# Patient Record
Sex: Male | Born: 1960 | Race: White | Hispanic: No | Marital: Single | Smoking: Current every day smoker
Health system: Southern US, Community
[De-identification: ages and names within clinical notes are randomized; demographics above are authoritative.]

## PROBLEM LIST (undated history)

## (undated) DIAGNOSIS — J45909 Unspecified asthma, uncomplicated: Secondary | ICD-10-CM

## (undated) DIAGNOSIS — G8929 Other chronic pain: Secondary | ICD-10-CM

## (undated) DIAGNOSIS — M549 Dorsalgia, unspecified: Secondary | ICD-10-CM

## (undated) DIAGNOSIS — K5792 Diverticulitis of intestine, part unspecified, without perforation or abscess without bleeding: Secondary | ICD-10-CM

## (undated) DIAGNOSIS — M51369 Other intervertebral disc degeneration, lumbar region without mention of lumbar back pain or lower extremity pain: Secondary | ICD-10-CM

## (undated) DIAGNOSIS — H409 Unspecified glaucoma: Secondary | ICD-10-CM

## (undated) DIAGNOSIS — M5136 Other intervertebral disc degeneration, lumbar region: Secondary | ICD-10-CM

## (undated) DIAGNOSIS — M1990 Unspecified osteoarthritis, unspecified site: Secondary | ICD-10-CM

## (undated) DIAGNOSIS — J302 Other seasonal allergic rhinitis: Secondary | ICD-10-CM

## (undated) DIAGNOSIS — R519 Headache, unspecified: Secondary | ICD-10-CM

## (undated) DIAGNOSIS — F419 Anxiety disorder, unspecified: Secondary | ICD-10-CM

## (undated) DIAGNOSIS — I739 Peripheral vascular disease, unspecified: Secondary | ICD-10-CM

## (undated) DIAGNOSIS — K759 Inflammatory liver disease, unspecified: Secondary | ICD-10-CM

## (undated) DIAGNOSIS — H5440 Blindness, one eye, unspecified eye: Secondary | ICD-10-CM

## (undated) DIAGNOSIS — R51 Headache: Secondary | ICD-10-CM

## (undated) DIAGNOSIS — J189 Pneumonia, unspecified organism: Secondary | ICD-10-CM

## (undated) DIAGNOSIS — K219 Gastro-esophageal reflux disease without esophagitis: Secondary | ICD-10-CM

## (undated) DIAGNOSIS — IMO0001 Reserved for inherently not codable concepts without codable children: Secondary | ICD-10-CM

## (undated) DIAGNOSIS — S72009A Fracture of unspecified part of neck of unspecified femur, initial encounter for closed fracture: Secondary | ICD-10-CM

## (undated) DIAGNOSIS — F319 Bipolar disorder, unspecified: Secondary | ICD-10-CM

## (undated) DIAGNOSIS — J449 Chronic obstructive pulmonary disease, unspecified: Secondary | ICD-10-CM

## (undated) DIAGNOSIS — R569 Unspecified convulsions: Secondary | ICD-10-CM

## (undated) HISTORY — PX: JOINT REPLACEMENT: SHX530

## (undated) HISTORY — PX: MANDIBLE SURGERY: SHX707

## (undated) HISTORY — PX: EYE SURGERY: SHX253

## (undated) HISTORY — PX: HIP SURGERY: SHX245

## (undated) HISTORY — DX: Fracture of unspecified part of neck of unspecified femur, initial encounter for closed fracture: S72.009A

---

## 2004-04-02 ENCOUNTER — Inpatient Hospital Stay: Payer: Self-pay

## 2005-07-17 ENCOUNTER — Ambulatory Visit: Payer: Self-pay

## 2006-06-01 ENCOUNTER — Emergency Department: Payer: Self-pay | Admitting: Emergency Medicine

## 2007-01-20 ENCOUNTER — Emergency Department: Payer: Self-pay | Admitting: Emergency Medicine

## 2007-12-07 ENCOUNTER — Emergency Department: Payer: Self-pay | Admitting: Emergency Medicine

## 2008-01-11 ENCOUNTER — Emergency Department: Payer: Self-pay | Admitting: Emergency Medicine

## 2010-09-28 ENCOUNTER — Emergency Department: Payer: Self-pay | Admitting: Internal Medicine

## 2010-09-29 ENCOUNTER — Inpatient Hospital Stay (HOSPITAL_COMMUNITY): Payer: Medicaid Other

## 2010-09-29 ENCOUNTER — Inpatient Hospital Stay (HOSPITAL_COMMUNITY)
Admission: EM | Admit: 2010-09-29 | Discharge: 2010-10-16 | DRG: 391 | Disposition: A | Discharge status: Home / Self Care | Payer: Medicaid Other | Source: Other Acute Inpatient Hospital | Attending: Surgery | Admitting: Surgery

## 2010-09-29 DIAGNOSIS — Z79899 Other long term (current) drug therapy: Secondary | ICD-10-CM

## 2010-09-29 DIAGNOSIS — K750 Abscess of liver: Secondary | ICD-10-CM | POA: Diagnosis present

## 2010-09-29 DIAGNOSIS — H5440 Blindness, one eye, unspecified eye: Secondary | ICD-10-CM | POA: Diagnosis present

## 2010-09-29 DIAGNOSIS — I319 Disease of pericardium, unspecified: Secondary | ICD-10-CM | POA: Diagnosis present

## 2010-09-29 DIAGNOSIS — J189 Pneumonia, unspecified organism: Secondary | ICD-10-CM | POA: Diagnosis not present

## 2010-09-29 DIAGNOSIS — N321 Vesicointestinal fistula: Secondary | ICD-10-CM | POA: Diagnosis present

## 2010-09-29 DIAGNOSIS — R1902 Left upper quadrant abdominal swelling, mass and lump: Secondary | ICD-10-CM

## 2010-09-29 DIAGNOSIS — R222 Localized swelling, mass and lump, trunk: Secondary | ICD-10-CM

## 2010-09-29 DIAGNOSIS — J90 Pleural effusion, not elsewhere classified: Secondary | ICD-10-CM | POA: Diagnosis present

## 2010-09-29 DIAGNOSIS — F319 Bipolar disorder, unspecified: Secondary | ICD-10-CM | POA: Diagnosis present

## 2010-09-29 DIAGNOSIS — B1920 Unspecified viral hepatitis C without hepatic coma: Secondary | ICD-10-CM | POA: Diagnosis present

## 2010-09-29 DIAGNOSIS — J4489 Other specified chronic obstructive pulmonary disease: Secondary | ICD-10-CM | POA: Diagnosis present

## 2010-09-29 DIAGNOSIS — F17200 Nicotine dependence, unspecified, uncomplicated: Secondary | ICD-10-CM | POA: Diagnosis present

## 2010-09-29 DIAGNOSIS — J449 Chronic obstructive pulmonary disease, unspecified: Secondary | ICD-10-CM | POA: Diagnosis present

## 2010-09-29 DIAGNOSIS — F1010 Alcohol abuse, uncomplicated: Secondary | ICD-10-CM | POA: Diagnosis present

## 2010-09-29 DIAGNOSIS — K5732 Diverticulitis of large intestine without perforation or abscess without bleeding: Principal | ICD-10-CM | POA: Diagnosis present

## 2010-09-29 LAB — MRSA PCR SCREENING: MRSA by PCR: NEGATIVE

## 2010-09-29 MED ORDER — GADOBENATE DIMEGLUMINE 529 MG/ML IV SOLN
18.0000 mL | Freq: Once | INTRAVENOUS | Status: AC
  Administered 2010-09-29: 18 mL via INTRAVENOUS

## 2010-09-30 LAB — COMPREHENSIVE METABOLIC PANEL
ALT: 21 U/L (ref 0–53)
AST: 22 U/L (ref 0–37)
Albumin: 2 g/dL — ABNORMAL LOW (ref 3.5–5.2)
CO2: 25 mEq/L (ref 19–32)
Calcium: 8 mg/dL — ABNORMAL LOW (ref 8.4–10.5)
Chloride: 99 mEq/L (ref 96–112)
Creatinine, Ser: 0.93 mg/dL (ref 0.4–1.5)
GFR calc Af Amer: >60 mL/min (ref >60)
GFR calc non Af Amer: >60 mL/min (ref >60)
Sodium: 133 mEq/L — ABNORMAL LOW (ref 135–145)

## 2010-09-30 LAB — CBC
Hemoglobin: 10.1 g/dL — ABNORMAL LOW (ref 13.0–17.0)
MCHC: 32.4 g/dL (ref 30.0–36.0)
Platelets: 464 10*3/uL — ABNORMAL HIGH (ref 150–400)
RBC: 3.52 MIL/uL — ABNORMAL LOW (ref 4.22–5.81)

## 2010-09-30 LAB — CANCER ANTIGEN 19-9: CA 19-9: 9.5 U/mL — ABNORMAL LOW (ref <35.0)

## 2010-09-30 LAB — AFP TUMOR MARKER: AFP-Tumor Marker: <1.3 ng/mL (ref 0.0–8.0)

## 2010-09-30 LAB — CEA: CEA: 1.6 ng/mL (ref 0.0–5.0)

## 2010-10-01 ENCOUNTER — Inpatient Hospital Stay (HOSPITAL_COMMUNITY): Payer: Medicaid Other

## 2010-10-01 LAB — CBC
Hemoglobin: 11.4 g/dL — ABNORMAL LOW (ref 13.0–17.0)
MCH: 29.5 pg (ref 26.0–34.0)
Platelets: 431 10*3/uL — ABNORMAL HIGH (ref 150–400)
RBC: 3.86 MIL/uL — ABNORMAL LOW (ref 4.22–5.81)
WBC: 18.6 10*3/uL — ABNORMAL HIGH (ref 4.0–10.5)

## 2010-10-01 LAB — URINALYSIS, ROUTINE W REFLEX MICROSCOPIC
Bilirubin Urine: NEGATIVE
Hgb urine dipstick: NEGATIVE
Ketones, ur: NEGATIVE mg/dL
Nitrite: NEGATIVE
Specific Gravity, Urine: 1.005 (ref 1.005–1.030)
Urobilinogen, UA: 0.2 mg/dL (ref 0.0–1.0)

## 2010-10-01 LAB — COMPREHENSIVE METABOLIC PANEL
ALT: 21 U/L (ref 0–53)
AST: 21 U/L (ref 0–37)
Albumin: 2.3 g/dL — ABNORMAL LOW (ref 3.5–5.2)
Alkaline Phosphatase: 120 U/L — ABNORMAL HIGH (ref 39–117)
CO2: 28 mEq/L (ref 19–32)
Chloride: 100 mEq/L (ref 96–112)
Creatinine, Ser: 1.15 mg/dL (ref 0.4–1.5)
GFR calc Af Amer: >60 mL/min (ref >60)
GFR calc non Af Amer: >60 mL/min (ref >60)
Potassium: 4.1 mEq/L (ref 3.5–5.1)
Sodium: 136 mEq/L (ref 135–145)
Total Bilirubin: 0.5 mg/dL (ref 0.3–1.2)

## 2010-10-01 LAB — URINE MICROSCOPIC-ADD ON

## 2010-10-02 ENCOUNTER — Inpatient Hospital Stay (HOSPITAL_COMMUNITY): Payer: Medicaid Other

## 2010-10-02 ENCOUNTER — Encounter (HOSPITAL_COMMUNITY): Payer: Self-pay | Admitting: Radiology

## 2010-10-02 LAB — CBC
Hemoglobin: 10.4 g/dL — ABNORMAL LOW (ref 13.0–17.0)
MCHC: 32.1 g/dL (ref 30.0–36.0)
RDW: 14.9 % (ref 11.5–15.5)
WBC: 14.9 10*3/uL — ABNORMAL HIGH (ref 4.0–10.5)

## 2010-10-02 LAB — URINE CULTURE: Culture  Setup Time: 201204031200

## 2010-10-02 LAB — HCV RNA QUANT RFLX ULTRA OR GENOTYP

## 2010-10-02 LAB — EXPECTORATED SPUTUM ASSESSMENT W GRAM STAIN, RFLX TO RESP C

## 2010-10-02 MED ORDER — IOHEXOL 300 MG/ML  SOLN
100.0000 mL | Freq: Once | INTRAMUSCULAR | Status: AC | PRN
  Administered 2010-10-02: 100 mL via INTRAVENOUS

## 2010-10-03 ENCOUNTER — Inpatient Hospital Stay (HOSPITAL_COMMUNITY): Payer: Medicaid Other

## 2010-10-04 ENCOUNTER — Inpatient Hospital Stay (HOSPITAL_COMMUNITY): Payer: Medicaid Other

## 2010-10-04 LAB — BASIC METABOLIC PANEL
BUN: 8 mg/dL (ref 6–23)
Calcium: 8.4 mg/dL (ref 8.4–10.5)
Creatinine, Ser: 1.1 mg/dL (ref 0.4–1.5)
GFR calc Af Amer: >60 mL/min (ref >60)
GFR calc non Af Amer: >60 mL/min (ref >60)

## 2010-10-04 LAB — CBC
MCHC: 32.8 g/dL (ref 30.0–36.0)
Platelets: 407 10*3/uL — ABNORMAL HIGH (ref 150–400)
RDW: 14.7 % (ref 11.5–15.5)
WBC: 16.7 10*3/uL — ABNORMAL HIGH (ref 4.0–10.5)

## 2010-10-05 LAB — CBC
MCV: 88 fL (ref 78.0–100.0)
Platelets: 414 10*3/uL — ABNORMAL HIGH (ref 150–400)
RDW: 14.8 % (ref 11.5–15.5)
WBC: 11.4 10*3/uL — ABNORMAL HIGH (ref 4.0–10.5)

## 2010-10-05 LAB — BASIC METABOLIC PANEL
BUN: 6 mg/dL (ref 6–23)
GFR calc Af Amer: >60 mL/min (ref >60)
GFR calc non Af Amer: >60 mL/min (ref >60)
Potassium: 3.9 mEq/L (ref 3.5–5.1)
Sodium: 136 mEq/L (ref 135–145)

## 2010-10-05 LAB — CULTURE, RESPIRATORY W GRAM STAIN: Culture: NORMAL

## 2010-10-07 LAB — CULTURE, ROUTINE-ABSCESS: Culture: NO GROWTH

## 2010-10-07 LAB — CBC
MCV: 88.5 fL (ref 78.0–100.0)
Platelets: 549 10*3/uL — ABNORMAL HIGH (ref 150–400)
RBC: 3.57 MIL/uL — ABNORMAL LOW (ref 4.22–5.81)
WBC: 9.8 10*3/uL (ref 4.0–10.5)

## 2010-10-11 ENCOUNTER — Encounter (HOSPITAL_COMMUNITY): Payer: Self-pay | Admitting: Radiology

## 2010-10-11 ENCOUNTER — Inpatient Hospital Stay (HOSPITAL_COMMUNITY): Payer: Medicaid Other

## 2010-10-11 MED ORDER — IOHEXOL 300 MG/ML  SOLN
80.0000 mL | Freq: Once | INTRAMUSCULAR | Status: AC | PRN
  Administered 2010-10-11: 80 mL via INTRAVENOUS

## 2010-10-12 ENCOUNTER — Inpatient Hospital Stay (HOSPITAL_COMMUNITY): Payer: Medicaid Other

## 2010-10-12 LAB — APTT: aPTT: 31 seconds (ref 24–37)

## 2010-10-12 LAB — CBC
HCT: 35.4 % — ABNORMAL LOW (ref 39.0–52.0)
Hemoglobin: 11.3 g/dL — ABNORMAL LOW (ref 13.0–17.0)
WBC: 8.6 10*3/uL (ref 4.0–10.5)

## 2010-10-12 LAB — COMPREHENSIVE METABOLIC PANEL
ALT: 9 U/L (ref 0–53)
Alkaline Phosphatase: 65 U/L (ref 39–117)
CO2: 26 mEq/L (ref 19–32)
GFR calc non Af Amer: >60 mL/min (ref >60)
Glucose, Bld: 93 mg/dL (ref 70–99)
Potassium: 3.9 mEq/L (ref 3.5–5.1)
Sodium: 137 mEq/L (ref 135–145)

## 2010-10-12 LAB — DIFFERENTIAL
Basophils Absolute: 0.2 10*3/uL — ABNORMAL HIGH (ref 0.0–0.1)
Lymphocytes Relative: 34 % (ref 12–46)
Monocytes Absolute: 1.1 10*3/uL — ABNORMAL HIGH (ref 0.1–1.0)
Neutro Abs: 3.7 10*3/uL (ref 1.7–7.7)

## 2010-10-12 LAB — ANAEROBIC CULTURE

## 2010-10-12 LAB — MAGNESIUM: Magnesium: 1.9 mg/dL (ref 1.5–2.5)

## 2010-10-14 NOTE — Consult Note (Signed)
NAME:  Perry Bullock, Perry Bullock               ACCOUNT NO.:  [id number]  MEDICAL RECORD NO.:  [id number]           PATIENT TYPE:  I  LOCATION:  2302                         FACILITY:  [facility]  PHYSICIAN:  Wilmon Arms. Corliss Skains, M.D. DATE OF BIRTH:  1961/04/25  DATE OF CONSULTATION:  09/29/2010 DATE OF DISCHARGE:                                CONSULTATION   REASON FOR EVALUATION:  Epigastric mass and a colovesical fistula.  HISTORY OF PRESENT ILLNESS:  This is a 50 year old male who was transferred to Dr. Laneta Sim[facility] this morning from [facility]. Reason for transfer was a large pericardial effusion.  The patient presents with approximately 3 weeks of progressive epigastric abdominal pain.  This was associated with decreased appetite secondary to the pain.  He also describes a fever, although he has been afebrile here. He denies any nausea, vomiting and has poor appetite.  The patient does have a history of diverticulitis which was first diagnosed about 5 years ago.  He was evaluated several times in [facility], but surgery was never recommended.  Over the last 18 months, he has noted some lower abdominal pain as well as some pneumaturia and occasional solid material in his urine.  He presents to [facility] for the epigastric pain. A CT scan there showed a very large epigastric mass and pericardial effusion.  The patient was subsequently transferred to [facility] for further evaluation and management.  PAST MEDICAL HISTORY: 1. Hepatitis C secondary to IV drug abuse. 2. Alcohol abuse. 3. Bipolar disorder. 4. Asthma. 5. Diverticulitis.  PAST SURGICAL HISTORY: 1. Right eye surgery. 2. Jaw surgery.  SOCIAL HISTORY:  The patient drinks heavily says he smokes just a few cigarettes a day.  ALLERGIES:  None.  MEDICATIONS:  Advair, albuterol, Depakote, Prilosec, Seroquel and trazodone.  PHYSICAL EXAMINATION:  GENERAL:  This is a well-developed, well- nourished male, in no  apparent distress.  He is awake, alert and cooperative. EYES:  EOMI.  Obvious changes post surgery of the right eye.  He is blind in the right eye. LUNGS:  Clear to auscultation bilaterally.  Normal respiratory effort. HEART:  Regular rate and rhythm.  No murmur. ABDOMEN:  Decreased bowel sounds, firm mass in the epigastrium which is moderately tender.  No sign of peritonitis.  EXTREMITIES:  No edema. SKIN:  With dryness and jaundice.  LABORATORY DATA:  We have a CBC from [place] showed a white count 20.8, hemoglobin 11.8 and platelet count 488.  CT of the abdomen and pelvis from [place] shows a pericolic fusion as well as a 10.4-cm complex cyst mass under the diaphragm that appears to communicate with the pericardium.  There is some indentation of the anterior surface of the liver with some surrounding liver edema.  He also has sigmoid diverticulosis with air within the bladder.  IMPRESSION: 1. Epigastric mass of unclear etiology.  This could become from the     liver versus the pericardium.  We will obtain a MRI of the abdomen     to determine if the mass is arising from the pericardium or the     liver. 2. 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  He will need     repair of this at some point. 3. Hepatitis C.  RECOMMENDATIONS:  We will await the results of the MRI before offering a recommendations on possible surgical resection and further management. We will continue to follow with you.     Wilmon Arms. Corliss Skains, M.D.     MKT/MEDQ  D:  09/29/2010  T:  09/30/2010  Job:  [id number]  cc:   Evelene Croon, M.D.  Electronically Signed by Manus Rudd M.D. on 10/14/2010 10:42:26 AM

## 2010-10-15 ENCOUNTER — Inpatient Hospital Stay (HOSPITAL_COMMUNITY): Payer: Medicaid Other

## 2010-10-16 LAB — COMPREHENSIVE METABOLIC PANEL
ALT: 10 U/L (ref 0–53)
Calcium: 8.8 mg/dL (ref 8.4–10.5)
Creatinine, Ser: 1.17 mg/dL (ref 0.4–1.5)
GFR calc Af Amer: >60 mL/min (ref >60)
Glucose, Bld: 108 mg/dL — ABNORMAL HIGH (ref 70–99)
Sodium: 137 mEq/L (ref 135–145)
Total Protein: 6.3 g/dL (ref 6.0–8.3)

## 2010-10-16 LAB — CBC
HCT: 37 % — ABNORMAL LOW (ref 39.0–52.0)
MCHC: 31.9 g/dL (ref 30.0–36.0)
RDW: 15.1 % (ref 11.5–15.5)

## 2010-10-16 LAB — MAGNESIUM: Magnesium: 2 mg/dL (ref 1.5–2.5)

## 2010-10-29 ENCOUNTER — Other Ambulatory Visit: Payer: Self-pay | Admitting: General Surgery

## 2010-10-29 DIAGNOSIS — K5792 Diverticulitis of intestine, part unspecified, without perforation or abscess without bleeding: Secondary | ICD-10-CM

## 2010-10-29 NOTE — H&P (Signed)
NAME:  Perry Bullock, Perry Bullock               ACCOUNT NO.:  [id number]  MEDICAL RECORD NO.:  [id number]           PATIENT TYPE:  I  LOCATION:  2302                         FACILITY:  [facility]  PHYSICIAN:  Evelene Croon, M.D.     DATE OF BIRTH:  12-16-1960  DATE OF ADMISSION:  09/29/2010 DATE OF DISCHARGE:                             HISTORY & PHYSICAL   REFERRING PHYSICIAN:  [facility], Dr. Electa Sniff.  REASON FOR ADMISSION:  Large pericardial effusion with subdiaphragmatic cystic mass.  CLINICAL HISTORY:  I was asked to transfer Mr. Penny to my service from [facility] Emergency Room after he presented there with a 3- week history of progressive epigastric and left-sided abdominal pain. He has a history of diverticulitis in the past with a known colovesical fistula causing pneumaturia .  He said that he was treated for this at [facility] about 6-8 months ago and was on antibiotics, but never required surgery.  He has a history of hepatitis C secondary to intravenous drug use many years ago as well as a history of ongoing heavy alcohol use and said that he felt this pain was most likely secondary to his liver.  He has not had much appetite for the past few weeks and said that he has even been drinking less alcohol and smoking less due to his abdominal discomfort.  He has had no nausea or vomiting. Denies any dysphagia.  He has been having normal bowel movements and denies any melena or bright red blood per rectum.  He does report some fever and chills as well as some fatigue.  A CT scan of the abdomen done at [facility] was reviewed by me. This showed a moderate to large size circumferential pericardial effusion that appear to communicate with a cystic mass or collection that extended down through the anterior portion of the diaphragm into the epigastrium with pressure on the liver causing some splaying of the right and left hepatic lobes  with surrounding edema of the liver.  There is also some air in the bladder and some bladder wall thickening with a loop of colon adjacent to it.  The patient had a small to moderate-sized left pleural effusion with some left lower lobe atelectasis.  There was trivial right pleural effusion.  REVIEW OF SYSTEMS:  As follows.  GENERAL:  He does report some fever and chills over the past few weeks.  He reports fatigue.  Appetite has been decreased.  His weight has been stable.  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  ENT: Negative.  He does not see a dentist regularly.  ENDOCRINE: He denies diabetes and hypothyroidism.  CARDIOVASCULAR:  He denies any chest pain or pressure.  He denies PND, but has had some orthopnea and exertional dyspnea.  He denies palpitations and peripheral edema. RESPIRATORY:  He denies cough and sputum production.  GI:  He had no nausea or vomiting.  He denies melena and bright red blood per rectum. GU:  He denies dysuria.  He has  had some hematuria and some sediment in air in his urine.  ALLERGIES:  None.  PSYCHIATRIC:  He does have a history of bipolar disorder and treated in a [facility]. HEMATOLOGIC:  Denies any history of bleeding disorders or easy bleeding.  PAST MEDICAL HISTORY:  Significant for a history of hepatitis C which he feels is probably secondary to intravenous drug use many years ago.  He denies current drug use.  He had history of bipolar disorder as mentioned above.  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  He had surgery on his right jaw after an accident.  He had surgery on his foot.  He has a history of diverticulitis with colovesical fistula as mentioned above.  This has been followed at [facility].  SOCIAL HISTORY:  He is unemployed and lives with his girlfriend.  He has smoked 1-1.5 pack of cigarettes per day and  over the past few weeks when he has cut down about 5 cigarettes per day.  He said that he usually drinks as much alcohol as he can get his hands on, but over the past 3 weeks has limited to a couple beers a day.  He has a long history of heavy drinking, but denies any history of DTs and said that he has quit drinking in the past for a couple years without problems.  He is currently trying to get on Disability.  FAMILY HISTORY:  Negative.  PHYSICAL EXAMINATION:  GENERAL:  He is a well-developed white male in no distress. VITAL SIGNS:  Blood pressure is 125/75.  His pulse is 90 and regular, respiratory rate is 16 unlabored. HEENT:  Normocephalic and atraumatic.  His left pupil is reactive to light and accommodation.  There is discoloration and no pupillary response in the right eye which is deviated laterally.  Oral exam shows no lesions.  He has poor dentition. NECK:  Normal carotid pulses bilaterally, but no bruits.  There is no adenopathy or thyromegaly.  There is no supraclavicular or axillary adenopathy. CARDIAC:  Regular rate and rhythm with distant heart sounds.  There is a loud rub present.  There is no murmur. LUNGS:  Clear. ABDOMEN:  Active bowel sounds.  His abdomen is protuberant.  The epigastrium is firm and tender.  There is mild tenderness across the lower abdomen, but no peritoneal signs. EXTREMITY EXAM:  No peripheral edema.  Pedal pulses are palpable bilaterally. SKIN:  Warm and dry. NEUROLOGIC:  Alert and oriented x3.  Motor and sensory exams are grossly normal.  LABORATORY EXAMINATION:  Significant for normal electrolytes.  BUN and creatinine are normal.  His albumin is low at 2.6.  Liver function profile is within normal limits.  Urinalysis shows large leukocyte esterase with no bacteria present.  White blood cell count is 20,000. Hemoglobin is 10.  IMPRESSION:  Mr. Sunde has a moderate to large circumferential pericardial effusion that appears to be in  continuity with a large subdiaphragmatic epigastric mass that is impinging on the liver causing surrounding edema.  It is not completely clear if this cystic mass originates from the liver or from the pericardium.  It could be a pericardial cyst, although I would not expect that to exert so much pressure on the liver that it causes surrounding edema from pressure. It is also possibly it could be a benign or malignant tumor.  I will consult General Surgery concerning the subdiaphragmatic process, so we can  decide on the best management.  I think he will require drainage of the pericardial effusion, probably through a subxiphoid approach, but may require partial sternotomy or abdominal exploration to definitively manage the cystic subdiaphragmatic process.  The patient is hemodynamically stable and relatively comfortable at this time and I do not think there is any evidence of tamponade at this time.  I do not think surgery needs to be done today, but I would tentatively plan to drain this pericardial fluid collection tomorrow on September 30, 2010.  I discussed all this with the patient and my plan is for general surgery consultation and he is in agreement with that.     Evelene Croon, M.D.     BB/MEDQ  D:  09/29/2010  T:  09/30/2010  Job:  [id number]  Electronically Signed by Evelene Croon M.D. on 10/29/2010 11:25:08 AM

## 2010-10-31 NOTE — Discharge Summary (Signed)
NAME:  Perry Bullock, Perry Bullock               ACCOUNT NO.:  [id number]  MEDICAL RECORD NO.:  [id number]           PATIENT TYPE:  I  LOCATION:  5126                         FACILITY:  [facility]  PHYSICIAN:  Perry Park. Daphine Deutscher, MD  DATE OF BIRTH:  Jul 02, 1960  DATE OF ADMISSION:  09/29/2010 DATE OF DISCHARGE:  10/16/2010                              DISCHARGE SUMMARY   HISTORY OF PRESENT ILLNESS:  Perry Bullock is a 50 year old gentleman who was originally seen at [facility] with evidence of a large pericardial effusion with a subdiaphragmatic cystic mass.  He was subsequently transferred to [facility] and seen by Perry Bullock of [facility].  His assessment was that this pericardial effusion appeared to be in continuity with the epigastric mass likely more essentially originating from the liver and less from the pericardium. Therefore, the decision was made that the general surgery consultation would be obtained.  The patient states he has had a history of colovesical fistula which was treated conservatively at [facility] and he states he has never required surgery by them.  However, he does state that he continues to have symptoms including pneumaturia.  He was seen by Perry Bullock in consultation for this epigastric/liver mass as well as colovesical fistula.  The patient had an MRI ordered.  SUMMARY OF HOSPITAL COURSE:  After admission on September 30, 2010, the MRI initially ordered suggested that this liver mass could possibly be malignant in origin.  However, there were still continued changes evident with possible diverticular findings in the pelvis.  Therefore, a CT scan was ordered.  This continued to show a large lesion in the left lobe of the liver tracking anterior to the liver between the anterior abdominal wall and lateral segment.  There was diverticular changes in the left colon and colonic wall thickening in the sigmoid adjacent to the bladder with evidence of gas  in the bladder suggesting evidence of a colovesical fistula.  Therefore, the decision was made that the patient may have a secondary liver abscess to a primary diverticulitis with colovesical fistula that has been chronic for some time.  The patient was taken to the Interventional Radiology Department and a CT-guided abscess drain was placed into the hepatic abscess.  The patient tolerated this well and showed significant improvement in his overall symptoms after this drain was placed.  His culture did not show any significant growth but his white count normalized fairly rapidly.  The patient had a subsequent followup CT scan 1 week later showing that the left hepatic lobe abscess had essentially resolved and therefore the drain was subsequently removed.  The following day there was concern that there may have been residual fluid collection in the liver and therefore a repeat ultrasound was ordered, however, this showed no focal abnormality or residual abscess in the left lobe of the liver. Therefore, no new drain or aspiration was performed.  Overall the patient's clinical picture was significantly improved since his admission.  He does have some underlying COPD and emphysematous changes of the lungs and there was concern at one point for pneumonia, however, the patient has  not presented and not found radiographic findings consistent with a persistent pneumonia.  He has had to be placed on Combivent inhaler as well as some nebulizers to improve his airway but has not had any respiratory distress throughout his hospitalization.  His antibiotics were eventually converted from IV to p.o. form which he has tolerated well.  At this point, his drain has been removed.  He has not shown residual fever or change in his labs and he has not had any residual abdominal pain.  He does continue to have mild intermittent pneumaturia.  However, we feel at this point the patient is stable for discharge  home on p.o. antibiotics with plans for eventual surgical intervention to be set up electively.  DISCHARGE DIAGNOSES: 1. Sigmoid diverticulitis - resolved. 2. 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 3. Secondary liver abscess status post percutaneous drain - resolved. 4. Chronic obstructive pulmonary disease/emphysema. 5. Mixed anxiety disorder.  DISCHARGE MEDICATIONS: 1. Cipro 500 mg twice daily. 2. Flagyl 500 mg twice daily. 3. Combivent inhaler 2 puffs 4 times daily. 4. Albuterol inhaler q.4 h. p.r.n. 5. Seroquel 100 mg 1 tablet daily at bedtime. 6. Trazodone 100 mg 1 tablet daily at bedtime. 7. Multivitamin once daily. 8. Oxycodone 5 mg immediate release 1-2 tablets q.4 h. p.r.n. pain.  He is given an appointment to follow up with Dr. Emelia Bullock on May 1 at 10:30 p.m.  He is otherwise given instructions regarding dietary and activity restrictions.  If he has symptoms such as high fever, increased abdominal pain, nausea, vomiting, he is to contact our office sooner for possible need for readmission.     Perry El, PA-C   ______________________________ Perry Park Daphine Deutscher, MD    KB/MEDQ  D:  10/16/2010  T:  10/16/2010  Job:  [id number]  Electronically Signed by Perry Bullock  on 10/21/2010 02:10:23 PM Electronically Signed by Perry Murphy MD on 10/31/2010 08:37:10 AM

## 2010-11-13 ENCOUNTER — Other Ambulatory Visit: Payer: Self-pay

## 2010-11-14 ENCOUNTER — Inpatient Hospital Stay: Admission: RE | Admit: 2010-11-14 | Payer: Self-pay | Source: Ambulatory Visit

## 2012-03-17 ENCOUNTER — Ambulatory Visit: Payer: Self-pay | Admitting: Neurology

## 2013-04-20 ENCOUNTER — Inpatient Hospital Stay: Payer: Self-pay | Admitting: Internal Medicine

## 2013-04-20 LAB — URINALYSIS, COMPLETE
Bilirubin,UR: NEGATIVE
Glucose,UR: NEGATIVE mg/dL (ref 0–75)
Hyaline Cast: 9
RBC,UR: 14 /HPF (ref 0–5)
Specific Gravity: 1.012 (ref 1.003–1.030)

## 2013-04-20 LAB — CBC
HCT: 50.5 % (ref 40.0–52.0)
HGB: 17.1 g/dL (ref 13.0–18.0)
MCH: 32.2 pg (ref 26.0–34.0)
Platelet: 327 10*3/uL (ref 150–440)
RBC: 5.3 10*6/uL (ref 4.40–5.90)
RDW: 13.7 % (ref 11.5–14.5)
WBC: 14 10*3/uL — ABNORMAL HIGH (ref 3.8–10.6)

## 2013-04-20 LAB — COMPREHENSIVE METABOLIC PANEL
Anion Gap: 6 — ABNORMAL LOW (ref 7–16)
Bilirubin,Total: 0.8 mg/dL (ref 0.2–1.0)
Chloride: 89 mmol/L — ABNORMAL LOW (ref 98–107)
EGFR (African American): 30 — ABNORMAL LOW
Osmolality: 273 (ref 275–301)
Potassium: 3.7 mmol/L (ref 3.5–5.1)
SGOT(AST): 29 U/L (ref 15–37)
SGPT (ALT): 31 U/L (ref 12–78)

## 2013-04-20 LAB — CK-MB: CK-MB: <0.5 ng/mL — ABNORMAL LOW (ref 0.5–3.6)

## 2013-04-20 LAB — TROPONIN I
Troponin-I: <0.02 ng/mL
Troponin-I: <0.02 ng/mL

## 2013-04-20 LAB — LIPASE, BLOOD: Lipase: 69 U/L — ABNORMAL LOW (ref 73–393)

## 2013-04-20 LAB — TSH: Thyroid Stimulating Horm: 4.17 u[IU]/mL

## 2013-04-20 LAB — CK TOTAL AND CKMB (NOT AT ARMC): CK, Total: 61 U/L (ref 35–232)

## 2013-04-21 LAB — CBC WITH DIFFERENTIAL/PLATELET
Basophil #: 0 10*3/uL (ref 0.0–0.1)
Basophil %: 0.6 %
Eosinophil #: 0.2 10*3/uL (ref 0.0–0.7)
Eosinophil %: 2 %
HCT: 38 % — ABNORMAL LOW (ref 40.0–52.0)
Lymphocyte #: 1.3 10*3/uL (ref 1.0–3.6)
Lymphocyte %: 15.1 %
MCH: 33.6 pg (ref 26.0–34.0)
Monocyte #: 1.2 x10 3/mm — ABNORMAL HIGH (ref 0.2–1.0)
Monocyte %: 13.8 %
Neutrophil #: 6 10*3/uL (ref 1.4–6.5)
RDW: 13.9 % (ref 11.5–14.5)

## 2013-04-21 LAB — COMPREHENSIVE METABOLIC PANEL
Albumin: 2.7 g/dL — ABNORMAL LOW (ref 3.4–5.0)
Anion Gap: 4 — ABNORMAL LOW (ref 7–16)
BUN: 18 mg/dL (ref 7–18)
Bilirubin,Total: 0.6 mg/dL (ref 0.2–1.0)
Chloride: 104 mmol/L (ref 98–107)
EGFR (African American): 41 — ABNORMAL LOW
Osmolality: 281 (ref 275–301)
Potassium: 3.4 mmol/L — ABNORMAL LOW (ref 3.5–5.1)
SGOT(AST): 29 U/L (ref 15–37)
SGPT (ALT): 24 U/L (ref 12–78)
Sodium: 140 mmol/L (ref 136–145)

## 2013-04-21 LAB — TROPONIN I: Troponin-I: <0.02 ng/mL

## 2013-04-21 LAB — CK-MB: CK-MB: 0.8 ng/mL (ref 0.5–3.6)

## 2013-04-21 LAB — LIPASE, BLOOD: Lipase: 141 U/L (ref 73–393)

## 2013-04-22 LAB — BASIC METABOLIC PANEL
Anion Gap: 8 (ref 7–16)
BUN: 21 mg/dL — ABNORMAL HIGH (ref 7–18)
Calcium, Total: 9.8 mg/dL (ref 8.5–10.1)
Chloride: 109 mmol/L — ABNORMAL HIGH (ref 98–107)
Co2: 26 mmol/L (ref 21–32)
EGFR (Non-African Amer.): 42 — ABNORMAL LOW
Potassium: 3.9 mmol/L (ref 3.5–5.1)

## 2013-06-07 ENCOUNTER — Ambulatory Visit: Payer: Self-pay | Admitting: Gastroenterology

## 2013-06-08 LAB — PATHOLOGY REPORT

## 2013-10-07 DIAGNOSIS — K5732 Diverticulitis of large intestine without perforation or abscess without bleeding: Secondary | ICD-10-CM | POA: Insufficient documentation

## 2014-03-28 DIAGNOSIS — R569 Unspecified convulsions: Secondary | ICD-10-CM | POA: Insufficient documentation

## 2014-03-28 DIAGNOSIS — R51 Headache: Secondary | ICD-10-CM

## 2014-03-28 DIAGNOSIS — Z720 Tobacco use: Secondary | ICD-10-CM | POA: Insufficient documentation

## 2014-03-28 DIAGNOSIS — M549 Dorsalgia, unspecified: Secondary | ICD-10-CM | POA: Insufficient documentation

## 2014-03-28 DIAGNOSIS — M25559 Pain in unspecified hip: Secondary | ICD-10-CM | POA: Insufficient documentation

## 2014-03-28 DIAGNOSIS — R519 Headache, unspecified: Secondary | ICD-10-CM | POA: Insufficient documentation

## 2014-03-28 DIAGNOSIS — F1010 Alcohol abuse, uncomplicated: Secondary | ICD-10-CM | POA: Insufficient documentation

## 2014-03-28 DIAGNOSIS — IMO0001 Reserved for inherently not codable concepts without codable children: Secondary | ICD-10-CM | POA: Insufficient documentation

## 2014-03-28 DIAGNOSIS — F17200 Nicotine dependence, unspecified, uncomplicated: Secondary | ICD-10-CM | POA: Insufficient documentation

## 2014-03-28 DIAGNOSIS — R6889 Other general symptoms and signs: Secondary | ICD-10-CM

## 2014-05-09 DIAGNOSIS — F32A Depression, unspecified: Secondary | ICD-10-CM | POA: Insufficient documentation

## 2014-05-09 DIAGNOSIS — F329 Major depressive disorder, single episode, unspecified: Secondary | ICD-10-CM | POA: Insufficient documentation

## 2014-07-13 ENCOUNTER — Emergency Department: Payer: Self-pay | Admitting: Student

## 2014-07-13 LAB — CBC
HCT: 45 % (ref 40.0–52.0)
HGB: 14.6 g/dL (ref 13.0–18.0)
MCH: 31.6 pg (ref 26.0–34.0)
MCHC: 32.4 g/dL (ref 32.0–36.0)
MCV: 98 fL (ref 80–100)
Platelet: 260 10*3/uL (ref 150–440)
RBC: 4.6 10*6/uL (ref 4.40–5.90)
RDW: 15.8 % — ABNORMAL HIGH (ref 11.5–14.5)
WBC: 4.5 10*3/uL (ref 3.8–10.6)

## 2014-07-13 LAB — URINALYSIS, COMPLETE
Bilirubin,UR: NEGATIVE
Blood: NEGATIVE
GLUCOSE, UR: NEGATIVE mg/dL (ref 0–75)
Ketone: NEGATIVE
Nitrite: NEGATIVE
Ph: 6 (ref 4.5–8.0)
Protein: NEGATIVE
RBC,UR: <1 /HPF (ref 0–5)
SQUAMOUS EPITHELIAL: NONE SEEN
Specific Gravity: 1.005 (ref 1.003–1.030)
WBC UR: 9 /HPF (ref 0–5)

## 2014-07-13 LAB — DRUG SCREEN, URINE
Amphetamines, Ur Screen: NEGATIVE (ref ?–1000)
Barbiturates, Ur Screen: NEGATIVE (ref ?–200)
Benzodiazepine, Ur Scrn: NEGATIVE (ref ?–200)
Cannabinoid 50 Ng, Ur ~~LOC~~: POSITIVE (ref ?–50)
Cocaine Metabolite,Ur ~~LOC~~: NEGATIVE (ref ?–300)
MDMA (Ecstasy)Ur Screen: NEGATIVE (ref ?–500)
METHADONE, UR SCREEN: NEGATIVE (ref ?–300)
OPIATE, UR SCREEN: NEGATIVE (ref ?–300)
Phencyclidine (PCP) Ur S: NEGATIVE (ref ?–25)
Tricyclic, Ur Screen: NEGATIVE (ref ?–1000)

## 2014-07-13 LAB — COMPREHENSIVE METABOLIC PANEL
ALBUMIN: 3.1 g/dL — AB (ref 3.4–5.0)
ALK PHOS: 71 U/L
ALT: 25 U/L
Anion Gap: 9 (ref 7–16)
BILIRUBIN TOTAL: 0.1 mg/dL — AB (ref 0.2–1.0)
BUN: 4 mg/dL — AB (ref 7–18)
CALCIUM: 8.1 mg/dL — AB (ref 8.5–10.1)
CHLORIDE: 105 mmol/L (ref 98–107)
Co2: 25 mmol/L (ref 21–32)
Creatinine: 0.79 mg/dL (ref 0.60–1.30)
EGFR (African American): >60
EGFR (Non-African Amer.): >60
Glucose: 92 mg/dL (ref 65–99)
Osmolality: 274 (ref 275–301)
POTASSIUM: 3.9 mmol/L (ref 3.5–5.1)
SGOT(AST): 37 U/L (ref 15–37)
Sodium: 139 mmol/L (ref 136–145)
Total Protein: 7.9 g/dL (ref 6.4–8.2)

## 2014-07-13 LAB — LIPASE, BLOOD: LIPASE: 141 U/L (ref 73–393)

## 2014-07-13 LAB — SALICYLATE LEVEL

## 2014-07-13 LAB — TSH: Thyroid Stimulating Horm: 1.36 u[IU]/mL

## 2014-07-13 LAB — ETHANOL: Ethanol: 269 mg/dL

## 2014-07-13 LAB — ACETAMINOPHEN LEVEL

## 2014-10-20 NOTE — H&P (Signed)
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  He presents to the hospital with complaints of severe nausea and vomiting over the past 5 days.  Apparently, the patient was doing well up until approximately 5 days ago, when he started having severe nausea and vomiting. He is not able to eat or drink at all. His last bowel movement was yesterday, which was a small amount of stool, no diarrhea was noted. No hematemesis, no black or tarry-looking stools or blood in his stool. He has been complaining also of epigastric abdominal pain, which is described as dull, constant pain, increasing with food or drink and nausea and vomiting. He admits to having some cold and hot chills and feeling diaphoretic. Because of that, he decided to come to Emergency Room for further evaluation. In the Emergency Room, he was noted to be in acute renal failure. He was noted to be hypercalcemic with calcium level as high at 14.5. He also is complaining of some bilateral flank pains. He was noted to be tachycardic with heart rate of 122 and required at least 2 liters of IV fluid infusion to have his heart rate improved. He is  producing minimal urine at this time. He tells me that he has been taking Tums for a long period of time now for abdominal pain. He also drinks plenty of alcohol. In the past, he was on tequila, however, now he has been drinking at least a 6 to 9 beers a day.  He is going through withdrawal. He has been more shaky and anxious over the past 5 days.   PAST MEDICAL HISTORY:  Significant for left foot as well as arm cellulitis status post surgery due to MRSA infection.  The  left toe was operated on by Dr. Doristine CounterBurnett in 2005.  History of COPD, asthma, ongoing tobacco abuse. 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 At that time, the patient had pericardial drainage done and was on chronic antibiotic therapy. 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  He intermittently has problems with gas, in his urine. He is scheduled to see General Surgery and have this issue repaired.  His  appointment is on 05/10/2013.  Also history of bipolar disorder for which he uses trazodone as well Seroquel, doses were just recently advanced.  History of ongoing alcohol abuse. Also history of hepatitis C apparently. He has been followed for that, not received any therapy since his levels are undetectable. But in the past, apparently because of alcohol abuse as well as hepatitis C, he had significantly elevated LFTs. Now since he cut down on drinking tequila, down to just simple beer, his LFTs have improved.   PAST SURGICAL HISTORY: Significant for history of left eye surgery, also jaw surgery, left toe surgery for MRSA infection, pericardial cyst surgery, [facility]  Right eye injury in a machine shop for which he had operation.  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   ALLERGIES:  No known drug allergies.   FAMILY HISTORY:  The patient's mother had breast carcinoma. Multiple different kinds of cancer in the family, diabetes mellitus, as well as hypertension in the patient's father.    SOCIAL HISTORY: The patient is a smoker, as well as alcohol drinker. Drinks at least 6 to 8 beers a day. He is on disability.   REVIEW OF SYSTEMS:  GENERAL:  Positive for feeling feverish and chilly, fatigue and weak for the past 5 days, nausea, vomiting, pains in the epigastric area as well as bilateral flanks, weight loss, also very poor  oral intake over the past 2 years, early satiety.  Also intermittent nausea and vomiting at home, history of glaucoma. Intermittent cough with dark yellow looking phlegm, which is chronic. Also intermittent wheezing as well as shortness of breath due to COPD and feeling presyncopal over the past week.  Urine is dark even after 2 liters of normal saline infusion here in the Emergency Room and he had somewhat strong smell, intermittent gas in the urine and fistula to bladder from colon.  Otherwise, denies any troubles with his or cataracts.  ENT: Denies any tinnitus, allergies, epistaxis, sinus tenderness or difficulty swallowing.  RESPIRATORY: Denies any hemoptysis, asthma or COPD. CARDIOVASCULAR: Denies chest pains, orthopnea, arrhythmias, palpitations.  GASTROINTESTINAL: Denies any hematemesis, rectal bleeding, change in bowel habits.  GENITOURINARY: Denies hematuria, frequency, incontinence.   ENDOCRINOLOGY:  Denies any polydipsia, nocturia, thyroid problems, heat or cold intolerance.    HEMATOLOGIC: Denies anemia, easy bruising, bleeding or swollen glands.  SKIN: Denies any acne, rashes, lesions or change in moles.  MUSCULOSKELETAL: Denies arthritis, cramps, swelling, gout.  NEUROLOGIC: No numbness, epilepsy or tremor. PSYCHIATRIC:  Denies anxiety, insomnia or depression.   PHYSICAL EXAMINATION: VITAL SIGNS: On arrival to the hospital temperature was 98, pulse 124, respiratory rate was 20, blood pressure 150/87, saturation was 93% on room air.  GENERAL: This is a well-developed, well-nourished Caucasian male in no significant distress comfortable on the stretcher.  HEENT: His left eye is 2 mm, reactive to light.  Right eye opacified cornea. No icterus or conjunctivitis. Has normal hearing. No pharyngeal erythema. Mucosa is dry.  NECK: No masses. Supple, nontender. Thyroid is not enlarged. No adenopathy. No JVD or carotid bruits bilaterally.  Full range of motion.  LUNGS: Clear to auscultation in  all fields. A few rales were heard as well as rhonchi as well as a few wheezes. Diminished breath sounds were noted, but no dullness to percussion or overt respiratory distress.  CARDIOVASCULAR: S1, S2 appreciated. No murmurs, gallops, or rubs noted.  Rhythm was regular.  PMI not lateralized. Chest is nontender to palpation.  EXTREMITIES: 1+ pedal pulses. No lower extremity edema, calf tenderness or cyanosis. ABDOMEN:  Soft. Tender in the epigastric area as well as (Dictation Anomaly) right upper quadrant. Mild hepatomegaly, but not splenomegaly was noted, however, the patient's liver seem to be soft and not tender to palpation.  RECTAL: Deferred.  MUSCLE STRENGTH: Able to move all extremities. No cyanosis, degenerative joint disease or  kyphosis noted.  SKIN:  Did not reveal any rashes, lesions, erythema, nodularity or induration. It was warm and dry to palpation. The patient did have significant CVA tenderness on percussion bilaterally.  LYMPHATIC: No adenopathy in his cervical region.  NEUROLOGICAL: Cranial nerves grossly intact. Sensory is intact. No dysarthria. The patient is alert and oriented to time, person and place, cooperative. Memory is good.  PSYCHIATRIC: No significant confusion, agitation or depression noted. The patient is  somewhat anxious as well as intermittently tremulous.   EKG:  The patient's EKG showed sinus tach at 121 beats per minutes. Left axis deviation, right bundle branch block, left anterior fascicular block, nonspecific ST-T changes were noted.   LABORATORY DATA:  BMP:  Glucose of 149, BUN and creatinine 19 and 2.71 normal in the past, sodium 134, estimated GFR was 26.  The patient's bicarbonate level is high at calcium level elevated to 14.5.  Lipase level is low at 69. Liver enzymes: Total protein was 8.8, otherwise liver enzymes were normal. Cardiac enzymes, first set negative. White blood cell count is elevated to 14.0, hemoglobin 17.1, platelet count 327. Urinalysis:  Cloudy, yellow urine, negative for glucose, bilirubin or ketones. Specific gravity 1.012; pH was 6.0, 1+ blood, 30 mg deciliter protein, negative for nitrites, 3+ leukocyte esterase, 14 red blood cells, 191 white blood cells, trace bacteria, less than 1 epithelial cells. White blood cell clumps were also present and 9 hyaline casts.   RADIOLOGIC STUDIES: Chest x-ray PA and lateral, on 04/20/2013 showed hyperinflation consistent with COPD, no evidence of pneumonia or CHF, chronic deformity of the lateral, mid ribs on the left, with adjacent pleural thickening. Ultrasound of abdomen, limited survey on 04/20/2013 showed no acute abnormality of the liver, no acute disease  of the gallbladder.  The liver demonstrated fatty infiltrative change. Pancreas was obscured by bowel gas,   ASSESSMENT AND PLAN: 1.  Acute renal failure. Admit the patient to the medical floor. Continue the patient on IV fluids. Follow his urine output, and check urinalysis, which was already done and get urine cultures. We will also get ultrasound of kidneys to rule out pyelonephritis. 2.  Dehydration. Continue IV fluids.  3.  Acute pyelonephritis. Get blood cultures as well as urine cultures. Start the patient on Rocephin IV. Follow clinically.  4.  Hyperglycemia. Check hemoglobin A1c.  5.  Hypercalcemia.  Likely due to Tums as well as severe dehydration. We will get TSH as well as intact PTH.  6.  Leukocytosis. We will follow with antibiotic therapy.  7.  Alcohol withdrawal initiate CIWA protocol.  8.  Tobacco abuse. Discussed cessation for 4 minutes. Nicotine replacement therapy will be initiated.  9.  Bipolar disorder.  Continue the patient on trazodone as well as Seroquel.    Time Spent:  1 hour  15 minutes on dictation and care.    ____________________________ Katharina Caper, MD rv:dp D: 04/20/2013 13:33:10 ET T: 04/20/2013 14:43:11 ET JOB#: [id number]  cc: Katharina Caper, MD, <Dictator> [facility] Jeoffrey Eleazer  MD ELECTRONICALLY SIGNED 05/18/2013 19:55

## 2014-10-20 NOTE — Consult Note (Signed)
PATIENT NAME:  Perry Bullock, Perry Bullock MR#:  [id number] DATE OF BIRTH:  03/22/1961  GASTROINTESTINAL CONSULTATION DATE OF CONSULTATION:  04/21/2013  REFERRING PHYSICIAN:  Dr. Enedina Finner CONSULTING PHYSICIAN:  Dow Adolph, MD  REASON FOR THE CONSULT: Epigastric abdominal pain.   HISTORY OF PRESENT ILLNESS: Perry Bullock is a 54 year old male with a past medical history notable for chronic obstructive pulmonary disease, ongoing heavy alcohol use, tobacco use, diverticulitis with fistula from colon to bladder, presenting to the hospital for evaluation of nausea, vomiting and epigastric pain. Perry Bullock says that all 3 symptoms came on together about eight days ago. It actually initially started with nausea and vomiting of unclear etiology. Then shortly after that he started developing epigastric pain and also pain to the right of the umbilicus. He felt as though the abdominal pain was significantly worse after he vomited. He was drinking heavy alcohol prior to these symptoms starting. He has not had any alcohol since the start of the symptoms. He normally does drink, he reports, about eight beers a day but his significant other reports it is actually significantly higher than that.   Currently Perry Bullock feels as though his symptoms have improved. His abdominal pain is much better at this time. He has been tolerating some liquids so far today.   He does deny any rectal bleeding or any hematemesis or dysphagia. He has never been diagnosed with cirrhosis that he is aware of.   PAST MEDICAL HISTORY: 1. Chronic obstructive pulmonary disease.  2. Ongoing tobacco use.  3. Gout.  4. Penetrating diverticulitis.   ALLERGIES: NKDA.   HOME MEDICATIONS: Unknown. He does not know.   FAMILY HISTORY: No family history of  colon cancer or GI malignancy.   SOCIAL HISTORY: Ongoing smoker and alcohol drinker, eight beers a day but likely significantly more than that.   REVIEW OF SYSTEMS:  GENERAL: Positive for  fatigue and generalized weakness.  CONSTITUTIONAL: No weight gain or weight loss.  No fever or chills. HEENT: No oral lesions or sore throat. No vision changes. GASTROINTESTINAL: See HPI.  HEME/LYMPH: No easy bruising or bleeding. CARDIOVASCULAR: Positive for occasional chest pain. GENITOURINARY: No hematuria. INTEGUMENTARY: No rashes or pruritus PSYCHIATRIC: No depression/anxiety.  ENDOCRINE: No heat/cold intolerance, no hair loss or skin changes. ALLERGIC/IMMUNOLOGIC: Negative for hives. RESPIRATORY: Positive for shortness of breath. MUSCULOSKELETAL: No joint swelling or muscle pain.  PHYSICAL EXAMINATION:  VITAL SIGNS: Temperature is 98.4. Pulse is 65, respirations are 19, blood pressure 148/84, pulse is 96% on room air.  GENERAL: Slightly unkempt-appearing male. Alert and oriented times 4.   HEENT: Extraocular movements are intact in the left eye. They are not intact in the right eye.  NECK: Soft, supple. JVP appears normal. No adenopathy. CHEST: Clear to auscultation. No wheeze or crackle. Respirations unlabored. HEART: Regular. No murmur, rub, or gallop.  Normal S1 and S2. ABDOMEN: Positive for mild epigastric tenderness, and also some tenderness to the right of the umbilicus. EXTREMITIES: No swelling, well perfused. SKIN: No rash or lesion. Skin color, texture, turgor normal. NEUROLOGICAL: Grossly intact. PSYCHIATRIC: Normal tone and affect. MUSCULOSKELETAL: No joint swelling or erythema.   LABORATORY DATA: Sodium 140, potassium 3.4, BUN 18, creatinine 2.10, chloride 104, bicarbonate 32. Lipase is 141. Hemoglobin A1c is 5. Liver enzymes are normal except for an albumin of 2.7. Cardiac enzymes are normal. White count is 8.8, hemoglobin 13, hematocrit 38, platelets are 181.   IMAGING: He had an abdominal ultrasound, which shows no acute abnormality of the gallbladder,  liver demonstrates fatty infiltrative change, pancreas is obscured by bowel gas.   ASSESSMENT AND PLAN:   Abdominal pain: It seems that abdominal pain has been improving while he has been here in the hospital. I suspect he likely had an element of alcohol gastritis. It is also possible that he may could have cirrhosis and could have portal hypertensive gastropathy. However, he has not had any issues with bleeding whatsoever.   Given the improvement while in the hospital. I would prefer to hold off on an upper endoscopy at this time. I will plan to see him in my clinic in approximately one month. If he is still having issues with abdominal pain at that time, then I would also go ahead with an upper endoscopy. We will also do further work-up to try and  evaluating the staging of his liver disease based on his heavy alcohol drinking. I did encourage alcohol abstinence.   In the meantime, would continue PPI. He can go home on a high dose PPI twice a day for one month. In addition, I have added Carafate to his regimen as suspension at 1 gram q. 8 h.    I will see how he does on this regimen. If he continues to have issues preventing him from going home then we will likely do an upper endoscopy while he is in the hospital.   Thank you for this consult.   ____________________________ Dow AdolphMatthew Rein, MD mr:sg D: 04/21/2013 17:38:50 ET T: 04/21/2013 18:35:30 ET JOB#: [id number]  cc: Dow AdolphMatthew Rein, MD, <Dictator> Kathalene FramesMATTHEW G REIN MD ELECTRONICALLY SIGNED 04/28/2013 16:25

## 2014-10-20 NOTE — Discharge Summary (Signed)
PATIENT NAME:  Perry Bullock, Perry Bullock MR#:  [id number] DATE OF BIRTH:  June 22, 1961  DATE OF ADMISSION:  04/20/2013 DATE OF DISCHARGE:  04/22/2013  PRESENTING COMPLAINT: Abdominal pain, nausea and vomiting.   DISCHARGE DIAGNOSES:  1. Acute prenatal azotemia, improved.  2. Hypercalcemia due to excessive use of TUMS.  3. Urinary tract infection.  4. Acute alcohol-induced gastritis.  5. Tobacco abuse.   CODE STATUS: FULL CODE.   MEDICATIONS:  1. Advair p.r.n.  2. ProAir HFA 90 mcg per inhalation 2 puffs b.i.d.  3. Combivent Respimat 100 mcg/20 mcg per inhalation 1 puff 4 times a day.  4. Spiriva 18 mcg inhalation daily.  5. Dulera inhalation as before.  6. Metoprolol 25 mg b.i.d.  7. Protonix 40 mg b.i.d.  8. Sucralfate 1 g/10 mL, 10 mL 4 to 6 hours.  9. Cipro 250 p.o. b.i.d.  10. Trazodone 150 mg at bedtime.  11. Quetiapine 150 mg extended release p.o. at bedtime.  12. Zofran ODT 4 mg 3 times a day as needed.   DIET: Low-sodium diet.   FOLLOWUP:  1. With Dr. Ulanda EdisonEmma Williams in 1 to 2 weeks.  2. With Dr. Shelle Ironein in 1 to 2 weeks.   LABORATORIES AT DISCHARGE:  1. Creatinine is 1.82. BUN is 21. Glucose is 90. Sodium is 143. Potassium is 3.9. Chloride is 109. CO2 is 26.  2. Echo Doppler showed EF of 40% to 45%. Mildly decreased left ventricular systolic function. Mildly dilated left atrium and right atrium. Mild to moderate TR.  3. Lipase is 141.  4. H and H is 13.4 and 38.4.  5. Urine culture 2000 gram-negative rods.  6. Ultrasound kidneys: Normal ultrasound.  7. UA positive for urinary tract infection.  8. Ultrasound of the abdomen demonstrated fatty infiltrative changes. Pancreas is obscured by gas.  9. Potassium on admission was 14.5. Creatinine on admission was 2.71. TSH was 4.17.   BRIEF SUMMARY OF HOSPITAL COURSE: [name] is a 54 year old Caucasian gentleman with history of alcohol and tobacco abuse who comes in with vomiting and abdominal pain. He was found to have elevated  creatinine to 2.71 and calcium of 14.1. He was admitted with:  1. Acute renal failure: Appears to be prerenal azotemia from nausea and vomiting. Improving after aggressive IV hydration. Creatinine trending down. Calcium back to normal. The patient was encouraged to take more fluids.  2. Acute gastritis with nausea, vomiting: Suspected from alcohol abuse and tobacco abuse. He was placed on Protonix b.i.d. Carafate was added by Dr. Shelle Ironein who was consulted from GI standpoint. The patient will follow up with Dr. Shelle Ironein as an outpatient for possible EGD if his symptoms do not get better.  3. Acute UTI/cystitis: Started on Cipro. ] 4. Hypercalcemia: Reactive from excessive intake of TUMS due to gastritis. The patient advised to use sparingly. The patient's calcium improved with hydration.  5. Alcohol abuse: Initiated CIWA protocol. Did not have symptoms of withdrawal.  6. Tobacco abuse: Tobacco smoking cessation was advised.  7. Bipolar disorder: Continue on trazodone and Seroquel.   Hospital stay otherwise remained stable. The patient remained a FULL CODE.   TIME SPENT: 40 minutes.  ____________________________ Perry HailSona A. Allena KatzPatel, MD sap:gb D: 04/24/2013 09:25:39 ET T: 04/24/2013 20:42:28 ET JOB#: [id number]  cc: Rosamaria Donn A. Allena KatzPatel, MD, <Dictator> Dow AdolphMatthew Rein, MD Lucillie GarfinkelEmma R. Mayford KnifeWilliams, MD  Willow OraSONA A Salayah Meares MD ELECTRONICALLY SIGNED 05/05/2013 13:26

## 2014-10-20 NOTE — Consult Note (Signed)
Brief Consult Note: Diagnosis: abdominal pain.   Patient was seen by consultant.   Consult note dictated.   Recommend further assessment or treatment.   Comments: Thank you for this consult.   This may be ETOH gastritis. Given lack of bleeding and improvement today,  don't think EGD would change management at this time ( needs PPI, ETOH cessation, carafate).  If he contiues to have n/v, pain then will go ahead with EGD but otherwise will pllan to see him in clinic as outpt and decide on EGD at that time. I have added carafate.  Electronic Signatures: Dow Adolphein, Matthew (MD)  (Signed 23-Oct-14 18:27)  Authored: Brief Consult Note   Last Updated: 23-Oct-14 18:27 by Dow Adolphein, Matthew (MD)

## 2014-11-29 ENCOUNTER — Encounter: Payer: Self-pay | Admitting: *Deleted

## 2014-11-29 ENCOUNTER — Emergency Department: Payer: Medicaid Other

## 2014-11-29 ENCOUNTER — Emergency Department
Admission: EM | Admit: 2014-11-29 | Discharge: 2014-11-29 | Disposition: A | Discharge status: Home / Self Care | Payer: Medicaid Other | Attending: Emergency Medicine | Admitting: Emergency Medicine

## 2014-11-29 DIAGNOSIS — W11XXXA Fall on and from ladder, initial encounter: Secondary | ICD-10-CM | POA: Insufficient documentation

## 2014-11-29 DIAGNOSIS — S2232XA Fracture of one rib, left side, initial encounter for closed fracture: Secondary | ICD-10-CM

## 2014-11-29 DIAGNOSIS — Y9389 Activity, other specified: Secondary | ICD-10-CM | POA: Diagnosis not present

## 2014-11-29 DIAGNOSIS — R52 Pain, unspecified: Secondary | ICD-10-CM

## 2014-11-29 DIAGNOSIS — Y998 Other external cause status: Secondary | ICD-10-CM | POA: Diagnosis not present

## 2014-11-29 DIAGNOSIS — Y9289 Other specified places as the place of occurrence of the external cause: Secondary | ICD-10-CM | POA: Diagnosis not present

## 2014-11-29 DIAGNOSIS — S79911A Unspecified injury of right hip, initial encounter: Secondary | ICD-10-CM | POA: Insufficient documentation

## 2014-11-29 DIAGNOSIS — Z720 Tobacco use: Secondary | ICD-10-CM | POA: Insufficient documentation

## 2014-11-29 DIAGNOSIS — S2242XA Multiple fractures of ribs, left side, initial encounter for closed fracture: Secondary | ICD-10-CM | POA: Insufficient documentation

## 2014-11-29 DIAGNOSIS — S42112A Displaced fracture of body of scapula, left shoulder, initial encounter for closed fracture: Secondary | ICD-10-CM | POA: Insufficient documentation

## 2014-11-29 DIAGNOSIS — S299XXA Unspecified injury of thorax, initial encounter: Secondary | ICD-10-CM | POA: Diagnosis present

## 2014-11-29 DIAGNOSIS — S42102A Fracture of unspecified part of scapula, left shoulder, initial encounter for closed fracture: Secondary | ICD-10-CM

## 2014-11-29 HISTORY — DX: Other chronic pain: G89.29

## 2014-11-29 HISTORY — DX: Unspecified asthma, uncomplicated: J45.909

## 2014-11-29 HISTORY — DX: Dorsalgia, unspecified: M54.9

## 2014-11-29 HISTORY — DX: Bipolar disorder, unspecified: F31.9

## 2014-11-29 MED ORDER — KETOROLAC TROMETHAMINE 30 MG/ML IJ SOLN: 60.0000 mg | Freq: Once | INTRAMUSCULAR | Status: AC

## 2014-11-29 MED ORDER — HYDROCODONE-ACETAMINOPHEN 5-325 MG PO TABS
ORAL_TABLET | ORAL | Status: AC
  Filled 2014-11-29: qty 1

## 2014-11-29 MED ORDER — KETOROLAC TROMETHAMINE 60 MG/2ML IM SOLN
INTRAMUSCULAR | Status: AC
  Administered 2014-11-29: 60 mg
  Filled 2014-11-29: qty 2

## 2014-11-29 MED ORDER — OXYCODONE-ACETAMINOPHEN 5-325 MG PO TABS: 1.0000 | ORAL_TABLET | ORAL | Status: DC | PRN

## 2014-11-29 MED ORDER — HYDROCODONE-ACETAMINOPHEN 5-325 MG PO TABS
2.0000 | ORAL_TABLET | Freq: Once | ORAL | Status: AC
  Administered 2014-11-29: 2 via ORAL

## 2014-11-29 NOTE — ED Notes (Signed)
Pt ambulatory with steady gait to triage, with reports that he was cleaning gutters and fell off the roof on Monday. He c/o pain in left shoulder and right hip.

## 2014-11-29 NOTE — Discharge Instructions (Signed)
Rib Fracture °A rib fracture is a break or crack in one of the bones of the ribs. The ribs are a group of long, curved bones that wrap around your chest and attach to your spine. They protect your lungs and other organs in the chest cavity. A broken or cracked rib is often painful, but most do not cause other problems. Most rib fractures heal on their own over time. However, rib fractures can be more serious if multiple ribs are broken or if broken ribs move out of place and push against other structures. °CAUSES  °· A direct blow to the chest. For example, this could happen during contact sports, a car accident, or a fall against a hard object. °· Repetitive movements with high force, such as pitching a baseball or having severe coughing spells. °SYMPTOMS  °· Pain when you breathe in or cough. °· Pain when someone presses on the injured area. °DIAGNOSIS  °Your caregiver will perform a physical exam. Various imaging tests may be ordered to confirm the diagnosis and to look for related injuries. These tests may include a chest X-ray, computed tomography (CT), magnetic resonance imaging (MRI), or a bone scan. °TREATMENT  °Rib fractures usually heal on their own in 1-3 months. The longer healing period is often associated with a continued cough or other aggravating activities. During the healing period, pain control is very important. Medication is usually given to control pain. Hospitalization or surgery may be needed for more severe injuries, such as those in which multiple ribs are broken or the ribs have moved out of place.  °HOME CARE INSTRUCTIONS  °· Avoid strenuous activity and any activities or movements that cause pain. Be careful during activities and avoid bumping the injured rib. °· Gradually increase activity as directed by your caregiver. °· Only take over-the-counter or prescription medications as directed by your caregiver. Do not take other medications without asking your caregiver first. °· Apply ice  to the injured area for the first 1-2 days after you have been treated or as directed by your caregiver. Applying ice helps to reduce inflammation and pain. °¨ Put ice in a plastic bag. °¨ Place a towel between your skin and the bag.   °¨ Leave the ice on for 15-20 minutes at a time, every 2 hours while you are awake. °· Perform deep breathing as directed by your caregiver. This will help prevent pneumonia, which is a common complication of a broken rib. Your caregiver may instruct you to: °¨ Take deep breaths several times a day. °¨ Try to cough several times a day, holding a pillow against the injured area. °¨ Use a device called an incentive spirometer to practice deep breathing several times a day. °· Drink enough fluids to keep your urine clear or pale yellow. This will help you avoid constipation.   °· Do not wear a rib belt or binder. These restrict breathing, which can lead to pneumonia.   °SEEK IMMEDIATE MEDICAL CARE IF:  °· You have a fever.   °· You have difficulty breathing or shortness of breath.   °· You develop a continual cough, or you cough up thick or bloody sputum. °· You feel sick to your stomach (nausea), throw up (vomit), or have abdominal pain.   °· You have worsening pain not controlled with medications.   °MAKE SURE YOU: °· Understand these instructions. °· Will watch your condition. °· Will get help right away if you are not doing well or get worse. °Document Released: 06/16/2005 Document Revised: 02/16/2013 Document Reviewed:   08/18/2012 ExitCare Patient Information 2015 [employer] [place] [place]. This information is not intended to replace advice given to you by your health care provider. Make sure you discuss any questions you have with your health care provider.  Scapular Fracture You have a fracture (break in bone) of your scapula. This is your shoulder blade. It is the large flat bone behind your shoulder. This is also the bone that makes up the ball and socket joint of your shoulder. Most  of the time surgery is not required for injuries to this bone unless the socket of the shoulder joint is involved. DIAGNOSIS  Because of the severity of force usually required to break this bone, x-rays are often taken of other bones likely to be injured at the same time. X-rays of the hip, knee, and pelvis may be taken. Specialized x-rays (arteriograms) may be needed if there are injuries to large blood vessels associated with this injury. HOME CARE INSTRUCTIONS   Simple fractures of the scapula can be treated with a sling and swathe type of immobilization. This means the involved area is held in place by putting the arm in a sling. A wrap is made around the upper arm with the sling holding the arm next to the chest. This may be removed for bathing as instructed by your caregiver.  Apply ice to the injury for 15-20 minutes 03-04 times per day. Put the ice in a plastic bag. Place a towel between the bag of ice and your skin, splint, or immobilization device.  Do not resume use until instructed by your caregiver. Usually full rehabilitation (exercises to improve the injury site) will begin sometime after the sling and swathe are removed. Then begin use gradually as directed. Do not increase use to the point of pain. If pain develops, decrease use and continue the above measures. Slowly increase activities that do not cause discomfort until you gradually achieve normal use without pain.  Only take over-the-counter or prescription medicines for pain, discomfort, or fever as directed by your caregiver. SEEK IMMEDIATE MEDICAL CARE IF:   Your pain and swelling increase and is uncontrolled with medications.  You develop new, unexplained symptoms or an increase of the symptoms which brought you to your caregiver.  You develop shortness or breath or cough up blood.  You are unable to move your arm or fingers. You develop warmth and swelling in your affected arm.  You develop an unexplained  temperature. Document Released: 06/16/2005 Document Revised: 09/08/2011 Document Reviewed: 05/08/2006 [facility] LtdExitCare Patient Information 2015 [place], [place]. This information is not intended to replace advice given to you by your health care provider. Make sure you discuss any questions you have with your health care provider.  Wear sling for comfort until cleared by the Orthopedic doctor.

## 2014-11-29 NOTE — ED Provider Notes (Signed)
[facility] Emergency Department Provider Note  ____________________________________________  Time seen: Approximately 7:47 PM  I have reviewed the triage vital signs and the nursing notes.   HISTORY  Chief Complaint Fall    HPI Perry Bullock is a 54 y.o. male presents status post fall from ladder 2 days ago. He reports hitting his shoulder on the air conditioner unit on the way down and landing on his right hip. The pain to the posterior aspect of his left shoulder. His pain is 10 over 10.   Past Medical History  Diagnosis Date  . Bipolar 1 disorder   . Chronic back pain   . Asthma     There are no active problems to display for this patient.   History reviewed. No pertinent past surgical history.  Current Outpatient Rx  Name  Route  Sig  Dispense  Refill  . oxyCODONE-acetaminophen (ROXICET) 5-325 MG per tablet   Oral   Take 1-2 tablets by mouth every 4 (four) hours as needed for severe pain.   15 tablet   0     Allergies Review of patient's allergies indicates no known allergies.  No family history on file.  Social History History  Substance Use Topics  . Smoking status: Current Every Day Smoker  . Smokeless tobacco: Not on file  . Alcohol Use: Yes    Review of Systems Constitutional: No fever/chills Eyes: No visual changes. ENT: No sore throat. Cardiovascular: Denies chest pain. Respiratory: Denies shortness of breath. Gastrointestinal: No abdominal pain.  No nausea, no vomiting.  No diarrhea.  No constipation. Genitourinary: Negative for dysuria. Musculoskeletal: Negative for back pain. Has a for left scapular pain. Positive for right hip pain. Skin: Negative for rash. Neurological: Negative for headaches, focal weakness or numbness.  10-point ROS otherwise negative.  ____________________________________________   PHYSICAL EXAM:  VITAL SIGNS: ED Triage Vitals  Enc Vitals Group     BP 11/29/14 1836 136/87 mmHg      Pulse Rate 11/29/14 1836 118     Resp 11/29/14 1836 16     Temp 11/29/14 1836 98.4 F (36.9 C)     Temp Source 11/29/14 1836 Oral     SpO2 11/29/14 1836 96 %     Weight 11/29/14 1836 165 lb (74.844 kg)     Height 11/29/14 1836 6' (1.829 m)     Head Cir --      Peak Flow --      Pain Score 11/29/14 1836 8     Pain Loc --      Pain Edu? --      Excl. in GC? --     Constitutional: Alert and oriented. Well appearing and in no acute distress. Eyes: Conjunctivae are normal. PERRL. EOMI. Head: Atraumatic. Nose: No congestion/rhinnorhea. Mouth/Throat: Mucous membranes are moist.  Oropharynx non-erythematous. Neck: No stridor.   Cardiovascular: Normal rate, regular rhythm. Grossly normal heart sounds.  Good peripheral circulation. Respiratory: Normal respiratory effort.  No retractions. Lungs CTAB. Gastrointestinal: Soft and nontender. No distention. No abdominal bruits. No CVA tenderness. Musculoskeletal: No lower extremity tenderness nor edema.  No joint effusions. Tenderness with left scapula. Tenderness right hip laterally. Able to ambulate without difficulty. Left shoulder with limited range of motion with extension and abduction. Neurologic:  Normal speech and language. No gross focal neurologic deficits are appreciated. Speech is normal. No gait instability. Skin:  Skin is warm, dry and intact. No rash noted. Psychiatric: Mood and affect are normal. Speech and behavior  are normal.  ____________________________________________   LABS (all labs ordered are listed, but only abnormal results are displayed)  Labs Reviewed - No data to display ____________________________________________  EKG  Not applicable ____________________________________________  RADIOLOGY  Left scapular fracture with minimal displacement. ____________________________________________   PROCEDURES  Procedure(s) performed: None  Critical Care performed:  No  ____________________________________________   INITIAL IMPRESSION / ASSESSMENT AND PLAN / ED COURSE  Pertinent labs & imaging results that were available during my care of the patient were reviewed by me and considered in my medical decision making (see chart for details).  Patient diagnosed with right hip contusion and left scapular fracture with minimal dislocation. Rx given for Motrin 800, Percocet as needed for pain. Instructed to follow-up with orthopedics tomorrow. Patient placed in a sling and shoulder immobilizer for comfort. ____________________________________________   FINAL CLINICAL IMPRESSION(S) / ED DIAGNOSES  Final diagnoses:  Scapular fracture, left, closed, initial encounter  Closed rib fracture, left, initial encounter      Evangeline Dakinharles M Beers, PA-C 11/29/14 2136  Sharyn CreamerMark Quale, MD 11/30/14 [id number]

## 2015-02-15 ENCOUNTER — Emergency Department
Admission: EM | Admit: 2015-02-15 | Discharge: 2015-02-15 | Disposition: A | Discharge status: Home / Self Care | Payer: Medicaid Other | Attending: Student | Admitting: Student

## 2015-02-15 ENCOUNTER — Encounter: Payer: Self-pay | Admitting: Emergency Medicine

## 2015-02-15 DIAGNOSIS — Z79899 Other long term (current) drug therapy: Secondary | ICD-10-CM | POA: Insufficient documentation

## 2015-02-15 DIAGNOSIS — Z720 Tobacco use: Secondary | ICD-10-CM | POA: Diagnosis not present

## 2015-02-15 DIAGNOSIS — Z7951 Long term (current) use of inhaled steroids: Secondary | ICD-10-CM | POA: Diagnosis not present

## 2015-02-15 DIAGNOSIS — N390 Urinary tract infection, site not specified: Secondary | ICD-10-CM | POA: Diagnosis not present

## 2015-02-15 DIAGNOSIS — R112 Nausea with vomiting, unspecified: Secondary | ICD-10-CM | POA: Insufficient documentation

## 2015-02-15 DIAGNOSIS — R1110 Vomiting, unspecified: Secondary | ICD-10-CM | POA: Diagnosis present

## 2015-02-15 LAB — CBC WITH DIFFERENTIAL/PLATELET
BASOS ABS: 0 10*3/uL (ref 0–0.1)
Basophils Relative: 0 %
EOS PCT: 3 %
Eosinophils Absolute: 0.3 10*3/uL (ref 0–0.7)
HCT: 43.4 % (ref 40.0–52.0)
HEMOGLOBIN: 14.2 g/dL (ref 13.0–18.0)
LYMPHS ABS: 1.5 10*3/uL (ref 1.0–3.6)
Lymphocytes Relative: 16 %
MCH: 31.8 pg (ref 26.0–34.0)
MCHC: 32.6 g/dL (ref 32.0–36.0)
MCV: 97.6 fL (ref 80.0–100.0)
Monocytes Absolute: 1.3 10*3/uL — ABNORMAL HIGH (ref 0.2–1.0)
Monocytes Relative: 14 %
NEUTROS PCT: 67 %
Neutro Abs: 6.2 10*3/uL (ref 1.4–6.5)
PLATELETS: 309 10*3/uL (ref 150–440)
RBC: 4.45 MIL/uL (ref 4.40–5.90)
RDW: 13.4 % (ref 11.5–14.5)
WBC: 9.3 10*3/uL (ref 3.8–10.6)

## 2015-02-15 LAB — URINALYSIS COMPLETE WITH MICROSCOPIC (ARMC ONLY)
Bilirubin Urine: NEGATIVE
Glucose, UA: NEGATIVE mg/dL
Hgb urine dipstick: NEGATIVE
Ketones, ur: NEGATIVE mg/dL
Nitrite: POSITIVE — AB
PROTEIN: NEGATIVE mg/dL
SQUAMOUS EPITHELIAL / LPF: NONE SEEN
Specific Gravity, Urine: 1.004 — ABNORMAL LOW (ref 1.005–1.030)
pH: 7 (ref 5.0–8.0)

## 2015-02-15 LAB — COMPREHENSIVE METABOLIC PANEL
ALK PHOS: 89 U/L (ref 38–126)
ALT: 20 U/L (ref 17–63)
AST: 37 U/L (ref 15–41)
Albumin: 3.9 g/dL (ref 3.5–5.0)
Anion gap: 11 (ref 5–15)
BUN: 7 mg/dL (ref 6–20)
CALCIUM: 9.3 mg/dL (ref 8.9–10.3)
CHLORIDE: 97 mmol/L — AB (ref 101–111)
CO2: 26 mmol/L (ref 22–32)
CREATININE: 0.84 mg/dL (ref 0.61–1.24)
GFR calc non Af Amer: >60 mL/min (ref >60)
Glucose, Bld: 125 mg/dL — ABNORMAL HIGH (ref 65–99)
Potassium: 4.6 mmol/L (ref 3.5–5.1)
Sodium: 134 mmol/L — ABNORMAL LOW (ref 135–145)
Total Bilirubin: <0.1 mg/dL — ABNORMAL LOW (ref 0.3–1.2)
Total Protein: 7.6 g/dL (ref 6.5–8.1)

## 2015-02-15 LAB — LIPASE, BLOOD: Lipase: 28 U/L (ref 22–51)

## 2015-02-15 MED ORDER — ONDANSETRON 4 MG PO TBDP: 4.0000 mg | ORAL_TABLET | Freq: Three times a day (TID) | ORAL | Status: DC | PRN

## 2015-02-15 MED ORDER — SODIUM CHLORIDE 0.9 % IV BOLUS (SEPSIS)
1000.0000 mL | Freq: Once | INTRAVENOUS | Status: AC
  Administered 2015-02-15: 1000 mL via INTRAVENOUS

## 2015-02-15 MED ORDER — NITROFURANTOIN MONOHYD MACRO 100 MG PO CAPS: 100.0000 mg | ORAL_CAPSULE | Freq: Two times a day (BID) | ORAL | Status: AC

## 2015-02-15 MED ORDER — OMEPRAZOLE 20 MG PO CPDR: 20.0000 mg | DELAYED_RELEASE_CAPSULE | Freq: Every day | ORAL | Status: DC

## 2015-02-15 MED ORDER — ONDANSETRON HCL 4 MG/2ML IJ SOLN
4.0000 mg | Freq: Once | INTRAMUSCULAR | Status: AC
  Administered 2015-02-15: 4 mg via INTRAVENOUS
  Filled 2015-02-15: qty 2

## 2015-02-15 MED ORDER — FAMOTIDINE IN NACL 20-0.9 MG/50ML-% IV SOLN
20.0000 mg | Freq: Once | INTRAVENOUS | Status: AC
  Administered 2015-02-15: 20 mg via INTRAVENOUS
  Filled 2015-02-15: qty 50

## 2015-02-15 NOTE — ED Notes (Signed)
Patient to ED with report of vomiting off and on for about 3 weeks, denies pain but reports that very little he eats will stay down.

## 2015-02-15 NOTE — ED Provider Notes (Signed)
[facility] Emergency Department Provider Note  ____________________________________________  Time seen: Approximately 8:21 AM  I have reviewed the triage vital signs and the nursing notes.   HISTORY  Chief Complaint Emesis    HPI Perry Bullock is a 54 y.o. 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 Patient reports that on 01/25/2015 he ate a [employer]. He has had vomiting since the day after. He denies any actual vomit in the past few days, he only has dry heaving but reports he is unable to keep down food or fluids. No fevers. No abdominal pain. No diarrhea. Last bowel movement was yesterday. No chest pain or shortness of breath. No modifying factors. Currently his symptoms are moderate.   Past Medical History  Diagnosis Date  . Bipolar 1 disorder   . Chronic back pain   . Asthma     There are no active problems to display for this patient.   History reviewed. No pertinent past surgical history.  Current Outpatient Rx  Name  Route  Sig  Dispense  Refill  . albuterol (PROVENTIL HFA;VENTOLIN HFA) 108 (90 BASE) MCG/ACT inhaler   Inhalation   Inhale 1 puff into the lungs every 6 (six) hours as needed for wheezing or shortness of breath.         . cetirizine (ZYRTEC) 10 MG tablet   Oral   Take 10 mg by mouth daily.         . fluticasone (FLONASE) 50 MCG/ACT nasal spray   Each Nare   Place 1 spray into both nostrils daily.         Marland Kitchen gabapentin (NEURONTIN) 300 MG capsule   Oral   Take 1,200 mg by mouth 2 (two) times daily.         Marland Kitchen latanoprost (XALATAN) 0.005 % ophthalmic solution   Both Eyes   Place 1 drop into both eyes at bedtime.         .  mometasone-formoterol (DULERA) 100-5 MCG/ACT AERO   Inhalation   Inhale 2 puffs into the lungs 2 (two) times daily.         . pantoprazole (PROTONIX) 40 MG tablet   Oral   Take 40 mg by mouth daily.         . QUEtiapine (SEROQUEL XR) 200 MG 24 hr tablet   Oral   Take 200 mg by mouth 2 (two) times daily.         Marland Kitchen tiotropium (SPIRIVA) 18 MCG inhalation capsule   Inhalation   Place 18 mcg into inhaler and inhale daily.         . traZODone (DESYREL) 100 MG tablet   Oral   Take 100 mg by mouth 3 (three) times daily.         Marland Kitchen zolpidem (AMBIEN) 10 MG tablet   Oral   Take 10 mg by mouth at bedtime as needed for sleep.           Allergies Review of patient's allergies indicates no known allergies.  History reviewed. No pertinent family history.  Social History Social History  Substance Use Topics  . Smoking status: Current Every Day Smoker  . Smokeless tobacco: None  . Alcohol Use: Yes    Review of  Systems Constitutional: No fever/chills Eyes: No visual changes. ENT: No sore throat. Cardiovascular: Denies chest pain. Respiratory: Denies shortness of breath. Gastrointestinal: No abdominal pain.  + nausea, + vomiting.  No diarrhea.  No constipation. Genitourinary: Negative for dysuria. Musculoskeletal: Negative for back pain. Skin: Negative for rash. Neurological: Negative for headaches, focal weakness or numbness.  10-point ROS otherwise negative.  ____________________________________________   PHYSICAL EXAM:  VITAL SIGNS: ED Triage Vitals  Enc Vitals Group     BP 02/15/15 0817 161/98 mmHg     Pulse Rate 02/15/15 0817 97     Resp 02/15/15 0817 18     Temp 02/15/15 0817 98.6 F (37 C)     Temp Source 02/15/15 0817 Oral     SpO2 02/15/15 0817 98 %     Weight 02/15/15 0817 161 lb (73.029 kg)     Height 02/15/15 0817 6' (1.829 m)     Head Cir --      Peak Flow --      Pain Score 02/15/15 0817 7     Pain Loc --      Pain Edu? --      Excl. in  GC? --     Constitutional: Alert and oriented. Nontoxic-appearing and in no acute distress. Eyes: Conjunctivae are normal.  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 Left pupil is reactive to light. Head: Atraumatic. Nose: No congestion/rhinnorhea. Mouth/Throat: Mucous membranes are moist.  Oropharynx non-erythematous. Neck: No stridor. Cardiovascular: Normal rate, regular rhythm. Grossly normal heart sounds.  Good peripheral circulation. Respiratory: Normal respiratory effort.  No retractions. Lungs CTAB. Gastrointestinal: Soft and nontender. No distention. No abdominal bruits. No CVA tenderness. Genitourinary: deferred Musculoskeletal: No lower extremity tenderness nor edema.  No joint effusions. Neurologic:  Normal speech and language. No gross focal neurologic deficits are appreciated. No gait instability. Skin:  Skin is warm, dry and intact. No rash noted. Psychiatric: Mood and affect are normal. Speech and behavior are normal.  ____________________________________________   LABS (all labs ordered are listed, but only abnormal results are displayed)  Labs Reviewed  CBC WITH DIFFERENTIAL/PLATELET - Abnormal; Notable for the following:    Monocytes Absolute 1.3 (*)    All other components within normal limits  COMPREHENSIVE METABOLIC PANEL - Abnormal; Notable for the following:    Sodium 134 (*)    Chloride 97 (*)    Glucose, Bld 125 (*)    Total Bilirubin <0.1 (*)    All other components within normal limits  URINALYSIS COMPLETEWITH MICROSCOPIC ([facility] ONLY) - Abnormal; Notable for the following:    Color, Urine YELLOW (*)    APPearance HAZY (*)    Specific Gravity, Urine 1.004 (*)    Nitrite POSITIVE (*)    Leukocytes, UA 3+ (*)    Bacteria, UA MANY (*)    All other components within normal limits  URINE CULTURE  LIPASE, BLOOD    ____________________________________________  EKG  none ____________________________________________  RADIOLOGY  none ____________________________________________   PROCEDURES  Procedure(s) performed: None  Critical Care performed: No  ____________________________________________   INITIAL IMPRESSION / ASSESSMENT AND PLAN / ED COURSE  Pertinent labs & imaging results that were available during my care of the patient were reviewed by me and considered in my medical decision making (see chart for details).  Perry Bullock is a 54 y.o. male history of chronic back pain, bipolar disorder, asthma, alcohol abuse disorder, alcoholic gastritis ongoing history of diverticular disease in the past and currently fistula  to the bladder from colon who presents for evaluation of nausea, recurrent nonbloody nonbilious emesis which began 20 days ago. No abdominal pain. On exam he is nontoxic appearing in no acute distress. Vital signs stable, he is afebrile. Plan for IV fluids, antiemetics, screening labs, urinalysis with culture. Reassess for disposition and need for advanced imaging.  ----------------------------------------- 10:31 AM on 02/15/2015 ----------------------------------------- CBC, CMP and lipase all unremarkable. Urinalysis with positive nitrites, 3+ leuk esterase, multiple white blood cells but he has a known fistula, this may be contaminated however he usually does not have a nitrite positive urinary tract infection so will discharge with Macrobid and await urine cultures. He has successfully by mouth challenged. I suspect his symptoms may be secondary to alcoholic gastritis but he is not tachycardic, not hypotensive and appears to be keeping up adequately with his hydration. He is mildly tachypneic and starting  to become hypertensive, I suspect he starting to withdraw from alcohol. I have offered him admission for alcohol detox given his history of complicated withdrawal, he  is not ready to quit drinking alcohol and would like to go home to continue drinking. I discussed extensive return precautions with he and his significant other who is at bedside, she is a Engineer, civil (consulting). We discussed that if he goes home and he is unable to tolerate alcohol and his withdrawal symptoms are worsening, he needs to return immediately to the emergency department for management of withdrawals. We discussed extensive return precautions and all are comfortable with the discharge plan. He'll follow up with his primary care doctor.  ____________________________________________   FINAL CLINICAL IMPRESSION(S) / ED DIAGNOSES  Final diagnoses:  Non-intractable vomiting with nausea, vomiting of unspecified type  UTI (lower urinary tract infection)      Gayla Doss, MD 02/15/15 1036

## 2015-02-15 NOTE — ED Notes (Signed)
Pt given graham crackers and gatorade, PO challange

## 2015-02-15 NOTE — ED Notes (Signed)
Pt states he ate at the [employer] on 7/29 and has been sick ever since, pt states he has been unable to keep anything down or eat, family states he has hx of diverticulitis and "tunneling" and gastritis with esophagus erosion and hx of mood disorder, family states he has had mood swings and lack of appetite, family also states hx etoh abuse, pt states last time he threw up was yesterday

## 2015-02-17 LAB — URINE CULTURE: Culture: >=100000

## 2015-03-14 DIAGNOSIS — S63259A Unspecified dislocation of unspecified finger, initial encounter: Secondary | ICD-10-CM | POA: Insufficient documentation

## 2015-04-03 ENCOUNTER — Encounter
Admission: EM | Disposition: A | Discharge status: Home Health | Payer: Self-pay | Source: Home / Self Care | Attending: Internal Medicine

## 2015-04-03 ENCOUNTER — Inpatient Hospital Stay
Admission: EM | Admit: 2015-04-03 | Discharge: 2015-04-06 | DRG: 871 | Disposition: A | Discharge status: Home Health | Payer: Medicaid Other | Attending: Internal Medicine | Admitting: Internal Medicine

## 2015-04-03 ENCOUNTER — Emergency Department: Payer: Medicaid Other

## 2015-04-03 ENCOUNTER — Encounter: Payer: Self-pay | Admitting: Internal Medicine

## 2015-04-03 ENCOUNTER — Inpatient Hospital Stay: Payer: Medicaid Other

## 2015-04-03 DIAGNOSIS — S0121XA Laceration without foreign body of nose, initial encounter: Secondary | ICD-10-CM | POA: Diagnosis present

## 2015-04-03 DIAGNOSIS — J45909 Unspecified asthma, uncomplicated: Secondary | ICD-10-CM | POA: Diagnosis present

## 2015-04-03 DIAGNOSIS — J852 Abscess of lung without pneumonia: Secondary | ICD-10-CM

## 2015-04-03 DIAGNOSIS — G8929 Other chronic pain: Secondary | ICD-10-CM | POA: Diagnosis present

## 2015-04-03 DIAGNOSIS — D649 Anemia, unspecified: Secondary | ICD-10-CM | POA: Diagnosis present

## 2015-04-03 DIAGNOSIS — R569 Unspecified convulsions: Secondary | ICD-10-CM | POA: Diagnosis present

## 2015-04-03 DIAGNOSIS — J850 Gangrene and necrosis of lung: Secondary | ICD-10-CM | POA: Diagnosis present

## 2015-04-03 DIAGNOSIS — F319 Bipolar disorder, unspecified: Secondary | ICD-10-CM | POA: Diagnosis present

## 2015-04-03 DIAGNOSIS — X58XXXA Exposure to other specified factors, initial encounter: Secondary | ICD-10-CM | POA: Diagnosis present

## 2015-04-03 DIAGNOSIS — B182 Chronic viral hepatitis C: Secondary | ICD-10-CM | POA: Diagnosis present

## 2015-04-03 DIAGNOSIS — R509 Fever, unspecified: Secondary | ICD-10-CM

## 2015-04-03 DIAGNOSIS — F17200 Nicotine dependence, unspecified, uncomplicated: Secondary | ICD-10-CM | POA: Diagnosis present

## 2015-04-03 DIAGNOSIS — J984 Other disorders of lung: Secondary | ICD-10-CM

## 2015-04-03 DIAGNOSIS — Z79891 Long term (current) use of opiate analgesic: Secondary | ICD-10-CM

## 2015-04-03 DIAGNOSIS — Z79899 Other long term (current) drug therapy: Secondary | ICD-10-CM

## 2015-04-03 DIAGNOSIS — J851 Abscess of lung with pneumonia: Secondary | ICD-10-CM | POA: Diagnosis present

## 2015-04-03 DIAGNOSIS — Z7982 Long term (current) use of aspirin: Secondary | ICD-10-CM | POA: Diagnosis not present

## 2015-04-03 DIAGNOSIS — F17210 Nicotine dependence, cigarettes, uncomplicated: Secondary | ICD-10-CM | POA: Diagnosis present

## 2015-04-03 DIAGNOSIS — M549 Dorsalgia, unspecified: Secondary | ICD-10-CM | POA: Diagnosis present

## 2015-04-03 DIAGNOSIS — J690 Pneumonitis due to inhalation of food and vomit: Secondary | ICD-10-CM | POA: Diagnosis present

## 2015-04-03 DIAGNOSIS — Y929 Unspecified place or not applicable: Secondary | ICD-10-CM

## 2015-04-03 DIAGNOSIS — R634 Abnormal weight loss: Secondary | ICD-10-CM | POA: Diagnosis present

## 2015-04-03 DIAGNOSIS — H5441 Blindness, right eye, normal vision left eye: Secondary | ICD-10-CM | POA: Diagnosis present

## 2015-04-03 DIAGNOSIS — J449 Chronic obstructive pulmonary disease, unspecified: Secondary | ICD-10-CM | POA: Diagnosis present

## 2015-04-03 DIAGNOSIS — D72829 Elevated white blood cell count, unspecified: Secondary | ICD-10-CM

## 2015-04-03 DIAGNOSIS — Z853 Personal history of malignant neoplasm of breast: Secondary | ICD-10-CM

## 2015-04-03 DIAGNOSIS — A419 Sepsis, unspecified organism: Principal | ICD-10-CM | POA: Diagnosis present

## 2015-04-03 HISTORY — DX: Chronic obstructive pulmonary disease, unspecified: J44.9

## 2015-04-03 HISTORY — DX: Inflammatory liver disease, unspecified: K75.9

## 2015-04-03 LAB — EXPECTORATED SPUTUM ASSESSMENT W REFEX TO RESP CULTURE

## 2015-04-03 LAB — IRON AND TIBC
IRON: 22 ug/dL — AB (ref 45–182)
Saturation Ratios: 15 % — ABNORMAL LOW (ref 17.9–39.5)
TIBC: 152 ug/dL — AB (ref 250–450)
UIBC: 130 ug/dL

## 2015-04-03 LAB — CBC WITH DIFFERENTIAL/PLATELET
BASOS ABS: 0 10*3/uL (ref 0–0.1)
Basophils Relative: 0 %
EOS PCT: 0 %
Eosinophils Absolute: 0 10*3/uL (ref 0–0.7)
HEMATOCRIT: 31.8 % — AB (ref 40.0–52.0)
Hemoglobin: 10.4 g/dL — ABNORMAL LOW (ref 13.0–18.0)
LYMPHS ABS: 1.8 10*3/uL (ref 1.0–3.6)
Lymphocytes Relative: 6 %
MCH: 30.3 pg (ref 26.0–34.0)
MCHC: 32.7 g/dL (ref 32.0–36.0)
MCV: 92.7 fL (ref 80.0–100.0)
Monocytes Absolute: 2.5 10*3/uL — ABNORMAL HIGH (ref 0.2–1.0)
Monocytes Relative: 8 %
Neutro Abs: 26.4 10*3/uL — ABNORMAL HIGH (ref 1.4–6.5)
Neutrophils Relative %: 86 %
Platelets: 448 10*3/uL — ABNORMAL HIGH (ref 150–440)
RBC: 3.44 MIL/uL — AB (ref 4.40–5.90)
RDW: 13.6 % (ref 11.5–14.5)
Smear Review: ADEQUATE
WBC: 30.7 10*3/uL — ABNORMAL HIGH (ref 3.8–10.6)

## 2015-04-03 LAB — EXPECTORATED SPUTUM ASSESSMENT W GRAM STAIN, RFLX TO RESP C

## 2015-04-03 LAB — VITAMIN B12: Vitamin B-12: 381 pg/mL (ref 180–914)

## 2015-04-03 LAB — BASIC METABOLIC PANEL
ANION GAP: 10 (ref 5–15)
BUN: 10 mg/dL (ref 6–20)
CALCIUM: 8.7 mg/dL — AB (ref 8.9–10.3)
CO2: 24 mmol/L (ref 22–32)
CREATININE: 1.06 mg/dL (ref 0.61–1.24)
Chloride: 100 mmol/L — ABNORMAL LOW (ref 101–111)
GFR calc non Af Amer: >60 mL/min (ref >60)
Glucose, Bld: 142 mg/dL — ABNORMAL HIGH (ref 65–99)
Potassium: 4.1 mmol/L (ref 3.5–5.1)
SODIUM: 134 mmol/L — AB (ref 135–145)

## 2015-04-03 LAB — RAPID HIV SCREEN (HIV 1/2 AB+AG)
HIV 1/2 Antibodies: NONREACTIVE
HIV-1 P24 Antigen - HIV24: NONREACTIVE

## 2015-04-03 LAB — FERRITIN: FERRITIN: 604 ng/mL — AB (ref 24–336)

## 2015-04-03 SURGERY — VIDEO BRONCHOSCOPY WITHOUT FLUORO
Anesthesia: Moderate Sedation | Laterality: Right

## 2015-04-03 MED ORDER — TIOTROPIUM BROMIDE MONOHYDRATE 18 MCG IN CAPS
18.0000 ug | ORAL_CAPSULE | Freq: Every day | RESPIRATORY_TRACT | Status: DC
  Administered 2015-04-03 – 2015-04-06 (×4): 18 ug via RESPIRATORY_TRACT
  Filled 2015-04-03: qty 5

## 2015-04-03 MED ORDER — PIPERACILLIN-TAZOBACTAM 3.375 G IVPB
3.3750 g | Freq: Once | INTRAVENOUS | Status: AC
  Administered 2015-04-03: 3.375 g via INTRAVENOUS
  Filled 2015-04-03: qty 50

## 2015-04-03 MED ORDER — INFLUENZA VAC SPLIT QUAD 0.5 ML IM SUSY
0.5000 mL | PREFILLED_SYRINGE | INTRAMUSCULAR | Status: AC
  Administered 2015-04-04: 0.5 mL via INTRAMUSCULAR
  Filled 2015-04-03: qty 0.5

## 2015-04-03 MED ORDER — MOMETASONE FURO-FORMOTEROL FUM 200-5 MCG/ACT IN AERO
2.0000 | INHALATION_SPRAY | Freq: Two times a day (BID) | RESPIRATORY_TRACT | Status: DC
  Administered 2015-04-03 – 2015-04-06 (×6): 2 via RESPIRATORY_TRACT
  Filled 2015-04-03: qty 8.8

## 2015-04-03 MED ORDER — ASPIRIN EC 81 MG PO TBEC
81.0000 mg | DELAYED_RELEASE_TABLET | Freq: Every day | ORAL | Status: DC
  Administered 2015-04-03 – 2015-04-06 (×3): 81 mg via ORAL
  Filled 2015-04-03 (×4): qty 1

## 2015-04-03 MED ORDER — SODIUM CHLORIDE 0.9 % IV BOLUS (SEPSIS)
1000.0000 mL | Freq: Once | INTRAVENOUS | Status: AC
Start: 2015-04-03 — End: 2015-04-03
  Administered 2015-04-03: 1000 mL via INTRAVENOUS

## 2015-04-03 MED ORDER — QUETIAPINE FUMARATE ER 200 MG PO TB24
200.0000 mg | ORAL_TABLET | Freq: Two times a day (BID) | ORAL | Status: DC
  Administered 2015-04-03 – 2015-04-06 (×7): 200 mg via ORAL
  Filled 2015-04-03 (×7): qty 1

## 2015-04-03 MED ORDER — ENOXAPARIN SODIUM 40 MG/0.4ML ~~LOC~~ SOLN
40.0000 mg | SUBCUTANEOUS | Status: DC
  Administered 2015-04-03 – 2015-04-05 (×3): 40 mg via SUBCUTANEOUS
  Filled 2015-04-03 (×3): qty 0.4

## 2015-04-03 MED ORDER — ACETAMINOPHEN 650 MG RE SUPP
650.0000 mg | Freq: Four times a day (QID) | RECTAL | Status: DC | PRN
  Filled 2015-04-03: qty 1

## 2015-04-03 MED ORDER — GABAPENTIN 400 MG PO CAPS
1200.0000 mg | ORAL_CAPSULE | Freq: Two times a day (BID) | ORAL | Status: DC
  Administered 2015-04-03 – 2015-04-06 (×7): 1200 mg via ORAL
  Filled 2015-04-03 (×7): qty 3

## 2015-04-03 MED ORDER — IOHEXOL 350 MG/ML SOLN
75.0000 mL | Freq: Once | INTRAVENOUS | Status: AC | PRN
  Administered 2015-04-03: 75 mL via INTRAVENOUS

## 2015-04-03 MED ORDER — LORATADINE 10 MG PO TABS
10.0000 mg | ORAL_TABLET | Freq: Every day | ORAL | Status: DC
  Administered 2015-04-03 – 2015-04-06 (×3): 10 mg via ORAL
  Filled 2015-04-03 (×4): qty 1

## 2015-04-03 MED ORDER — VANCOMYCIN HCL IN DEXTROSE 1-5 GM/200ML-% IV SOLN
1000.0000 mg | Freq: Once | INTRAVENOUS | Status: AC
  Administered 2015-04-03: 1000 mg via INTRAVENOUS
  Filled 2015-04-03: qty 200

## 2015-04-03 MED ORDER — TRAZODONE HCL 100 MG PO TABS
100.0000 mg | ORAL_TABLET | Freq: Three times a day (TID) | ORAL | Status: DC
  Administered 2015-04-03 – 2015-04-06 (×10): 100 mg via ORAL
  Filled 2015-04-03 (×10): qty 1

## 2015-04-03 MED ORDER — IPRATROPIUM-ALBUTEROL 0.5-2.5 (3) MG/3ML IN SOLN
3.0000 mL | Freq: Four times a day (QID) | RESPIRATORY_TRACT | Status: DC
  Administered 2015-04-03 (×2): 3 mL via RESPIRATORY_TRACT
  Filled 2015-04-03 (×2): qty 3

## 2015-04-03 MED ORDER — SODIUM CHLORIDE 0.9 % IJ SOLN
3.0000 mL | Freq: Two times a day (BID) | INTRAMUSCULAR | Status: DC
  Administered 2015-04-03 – 2015-04-06 (×6): 3 mL via INTRAVENOUS

## 2015-04-03 MED ORDER — SODIUM CHLORIDE 0.9 % IJ SOLN: 3.0000 mL | INTRAMUSCULAR | Status: DC | PRN

## 2015-04-03 MED ORDER — ZOLPIDEM TARTRATE 5 MG PO TABS
10.0000 mg | ORAL_TABLET | Freq: Every evening | ORAL | Status: DC | PRN
  Administered 2015-04-04 – 2015-04-06 (×2): 10 mg via ORAL
  Filled 2015-04-03 (×2): qty 2

## 2015-04-03 MED ORDER — SODIUM CHLORIDE 0.9 % IV SOLN: 250.0000 mL | INTRAVENOUS | Status: DC | PRN

## 2015-04-03 MED ORDER — GUAIFENESIN-DM 100-10 MG/5ML PO SYRP
5.0000 mL | ORAL_SOLUTION | Freq: Four times a day (QID) | ORAL | Status: DC | PRN
  Administered 2015-04-03 – 2015-04-06 (×4): 5 mL via ORAL
  Filled 2015-04-03 (×5): qty 5

## 2015-04-03 MED ORDER — ONDANSETRON HCL 4 MG PO TABS: 4.0000 mg | ORAL_TABLET | Freq: Four times a day (QID) | ORAL | Status: DC | PRN

## 2015-04-03 MED ORDER — LATANOPROST 0.005 % OP SOLN
1.0000 [drp] | Freq: Every day | OPHTHALMIC | Status: DC
  Administered 2015-04-03 – 2015-04-05 (×3): 1 [drp] via OPHTHALMIC
  Filled 2015-04-03: qty 2.5

## 2015-04-03 MED ORDER — PIPERACILLIN-TAZOBACTAM 3.375 G IVPB
3.3750 g | Freq: Three times a day (TID) | INTRAVENOUS | Status: DC
  Administered 2015-04-03 – 2015-04-05 (×7): 3.375 g via INTRAVENOUS
  Filled 2015-04-03 (×8): qty 50

## 2015-04-03 MED ORDER — VANCOMYCIN HCL IN DEXTROSE 1-5 GM/200ML-% IV SOLN
1000.0000 mg | Freq: Two times a day (BID) | INTRAVENOUS | Status: DC
  Administered 2015-04-03 – 2015-04-05 (×5): 1000 mg via INTRAVENOUS
  Filled 2015-04-03 (×7): qty 200

## 2015-04-03 MED ORDER — ALBUTEROL SULFATE (2.5 MG/3ML) 0.083% IN NEBU
2.5000 mg | INHALATION_SOLUTION | Freq: Four times a day (QID) | RESPIRATORY_TRACT | Status: DC
Start: 2015-04-03 — End: 2015-04-06
  Administered 2015-04-03 – 2015-04-06 (×11): 2.5 mg via RESPIRATORY_TRACT
  Filled 2015-04-03 (×12): qty 3

## 2015-04-03 MED ORDER — PANTOPRAZOLE SODIUM 40 MG PO TBEC
40.0000 mg | DELAYED_RELEASE_TABLET | Freq: Every day | ORAL | Status: DC
  Administered 2015-04-03 – 2015-04-06 (×4): 40 mg via ORAL
  Filled 2015-04-03 (×4): qty 1

## 2015-04-03 MED ORDER — FLUTICASONE PROPIONATE 50 MCG/ACT NA SUSP
1.0000 | Freq: Every day | NASAL | Status: DC
  Administered 2015-04-03 – 2015-04-06 (×4): 1 via NASAL
  Filled 2015-04-03: qty 16

## 2015-04-03 MED ORDER — ACETAMINOPHEN 325 MG PO TABS
650.0000 mg | ORAL_TABLET | Freq: Four times a day (QID) | ORAL | Status: DC | PRN
  Administered 2015-04-03 – 2015-04-04 (×4): 650 mg via ORAL
  Filled 2015-04-03 (×4): qty 2

## 2015-04-03 MED ORDER — ONDANSETRON HCL 4 MG/2ML IJ SOLN: 4.0000 mg | Freq: Four times a day (QID) | INTRAMUSCULAR | Status: DC | PRN

## 2015-04-03 MED ORDER — MOMETASONE FURO-FORMOTEROL FUM 100-5 MCG/ACT IN AERO
2.0000 | INHALATION_SPRAY | Freq: Two times a day (BID) | RESPIRATORY_TRACT | Status: DC
  Administered 2015-04-03: 2 via RESPIRATORY_TRACT
  Filled 2015-04-03: qty 8.8

## 2015-04-03 NOTE — Consult Note (Signed)
[facility] Medicine Consultation     Date: 04/03/2015,   MRN# [id number] Perry Bullock 1960-12-02 Code Status:     Code Status Orders        Start     Ordered   04/03/15 0732  Full code   Continuous     04/03/15 0732     Hosp day:@LENGTHOFSTAYDAYS @ Referring MD: @ATDPROV @     PCP:      AdmissionWeight: 163 lb (73.936 kg)                 CurrentWeight: 163 lb (73.936 kg) Perry Bullock is a 54 y.o. old male seen in consultation for RUL cavitary lesion  WU:[id number], cough, SOB HPI  Perry Bullock is a 54 y.o. male with a known history of COPD, hepatitis C, bipolar disorder, history of alcohol use in the past presents to the emergency room with the complaints of cough with production of greenish yellow sputum for the past 3-4 days. Admits having fever.  + night sweats. No hemoptysis. intermittent shortness of breath. No wheezing.  -mostly complains of right-sided chest pain increased with respirations.  -Admits he has been having poor appetite with weight loss of 30 pounds for the past few months -feels  dizzy and had a near-syncopal episode -Evaluation in the ED revealed elevated white blood cell count of 30.7, H&H 10.4 over 31.8. Chest x-ray right upper lobe cavitary lesion with air-fluid levels suspicious for lung abscess.   - After obtaining blood and sputum cultures patient was started on broad-spectrum antibiotics-vancomycin and Zosyn, was placed on negative pressure room        MEDICATIONS    Home Medication:  No current outpatient prescriptions on file.  Current Medication:   Current facility-administered medications:  .  0.9 %  sodium chloride infusion, 250 mL, Intravenous, PRN, Crissie Figures, MD .  acetaminophen (TYLENOL) tablet 650 mg, 650 mg, Oral, Q6H PRN, 650 mg at 04/03/15 1002 **OR** acetaminophen (TYLENOL) suppository 650 mg, 650 mg, Rectal, Q6H PRN, Crissie Figures, MD .  aspirin EC tablet 81 mg, 81 mg, Oral, Daily, Crissie Figures, MD, 81 mg at 04/03/15 0937 .  enoxaparin (LOVENOX) injection 40 mg, 40 mg, Subcutaneous, Q24H, Crissie Figures, MD, 40 mg at 04/03/15 0937 .  fluticasone (FLONASE) 50 MCG/ACT nasal spray 1 spray, 1 spray, Each Nare, Daily, Crissie Figures, MD, 1 spray at 04/03/15 [phone] .  gabapentin (NEURONTIN) capsule 1,200 mg, 1,200 mg, Oral, BID, Crissie Figures, MD, 1,200 mg at 04/03/15 9562 .  [START ON 04/04/2015] Influenza vac split quadrivalent PF (FLUARIX) injection 0.5 mL, 0.5 mL, Intramuscular, Tomorrow-1000, Enedina Finner, MD .  ipratropium-albuterol (DUONEB) 0.5-2.5 (3) MG/3ML nebulizer solution 3 mL, 3 mL, Nebulization, Q6H, Crissie Figures, MD, 3 mL at 04/03/15 1407 .  latanoprost (XALATAN) 0.005 % ophthalmic solution 1 drop, 1 drop, Both Eyes, QHS, Crissie Figures, MD .  loratadine (CLARITIN) tablet 10 mg, 10 mg, Oral, Daily, Crissie Figures, MD, 10 mg at 04/03/15 0937 .  mometasone-formoterol (DULERA) 100-5 MCG/ACT inhaler 2 puff, 2 puff, Inhalation, BID, Crissie Figures, MD, 2 puff at 04/03/15 [phone] .  ondansetron (ZOFRAN) tablet 4 mg, 4 mg, Oral, Q6H PRN **OR** ondansetron (ZOFRAN) injection 4 mg, 4 mg, Intravenous, Q6H PRN, Crissie Figures, MD .  pantoprazole (PROTONIX) EC tablet 40 mg, 40 mg, Oral, Daily, Crissie Figures, MD, 40 mg at 04/03/15 0937 .  piperacillin-tazobactam (ZOSYN) IVPB 3.375 g, 3.375 g, Intravenous,  3 times per day, Olene Floss, RPH, 3.375 g at 04/03/15 4098 .  QUEtiapine (SEROQUEL XR) 24 hr tablet 200 mg, 200 mg, Oral, BID, Crissie Figures, MD, 200 mg at 04/03/15 1191 .  sodium chloride 0.9 % injection 3 mL, 3 mL, Intravenous, Q12H, Crissie Figures, MD, 3 mL at 04/03/15 0940 .  sodium chloride 0.9 % injection 3 mL, 3 mL, Intravenous, PRN, Crissie Figures, MD .  tiotropium [facility]) inhalation capsule 18 mcg, 18 mcg, Inhalation, Daily, Crissie Figures, MD, 18 mcg at 04/03/15 [phone] .  traZODone (DESYREL) tablet 100 mg, 100 mg, Oral, TID, Crissie Figures, MD,  100 mg at 04/03/15 0937 .  vancomycin (VANCOCIN) IVPB 1000 mg/200 mL premix, 1,000 mg, Intravenous, Q12H, Melissa D Maccia, RPH, 1,000 mg at 04/03/15 1227 .  zolpidem (AMBIEN) tablet 10 mg, 10 mg, Oral, QHS PRN, Crissie Figures, MD     ALLERGIES   Review of patient's allergies indicates no known allergies.     REVIEW OF SYSTEMS   Review of Systems  Constitutional: Positive for fever, chills, weight loss, malaise/fatigue and diaphoresis.  HENT: Positive for congestion. Negative for hearing loss.   Eyes: Negative for blurred vision and double vision.  Respiratory: Positive for cough, sputum production and wheezing. Negative for hemoptysis and shortness of breath.   Cardiovascular: Negative for chest pain, palpitations, orthopnea and leg swelling.  Gastrointestinal: Negative for heartburn, nausea, vomiting, abdominal pain, diarrhea, constipation and blood in stool.  Skin: Negative for rash.  Neurological: Positive for dizziness and weakness. Negative for headaches.  Endo/Heme/Allergies: Does not bruise/bleed easily.  Psychiatric/Behavioral: The patient is nervous/anxious.   All other systems reviewed and are negative.    VS: BP 110/58 mmHg  Pulse 93  Temp(Src) 96 F (35.6 C) (Oral)  Resp 18  Ht 6' (1.829 m)  Wt 163 lb (73.936 kg)  BMI 22.10 kg/m2  SpO2 100%     PHYSICAL EXAM   Physical Exam  Constitutional: He is oriented to person, place, and time. He appears well-developed and well-nourished. No distress.  HENT:  Head: Normocephalic.  Mouth/Throat: No oropharyngeal exudate.  R EYE BLINDNESS LACERATION NASAL BRIDGE POOR DENTITION  Eyes: EOM are normal. Pupils are equal, round, and reactive to light. No scleral icterus.  Neck: Normal range of motion. Neck supple.  Cardiovascular: Normal rate, regular rhythm and normal heart sounds.   No murmur heard. Pulmonary/Chest: No stridor. No respiratory distress. He has no wheezes. He has no rales.  Abdominal: Soft.  Bowel sounds are normal. He exhibits no distension. There is no tenderness. There is no rebound.  Musculoskeletal: Normal range of motion. He exhibits no edema.  Neurological: He is alert and oriented to person, place, and time. He displays normal reflexes. Coordination normal.  Skin: Skin is warm. He is not diaphoretic.  Psychiatric: He has a normal mood and affect.        LABS    Recent Labs     04/03/15  0309  HGB  10.4*  HCT  31.8*  MCV  92.7  WBC  30.7*  BUN  10  CREATININE  1.06  GLUCOSE  142*  CALCIUM  8.7*  ,    No results for input(s): PH in the last 72 hours.  Invalid input(s): PCO2, PO2, BASEEXCESS, BASEDEFICITE, TFT    CULTURE RESULTS   Recent Results (from the past 240 hour(s))  Culture, expectorated sputum-assessment     Status: None   Collection Time: 04/03/15  5:17  AM  Result Value Ref Range Status   Specimen Description SPUTUM  Final   Special Requests NONE  Final   Sputum evaluation THIS SPECIMEN IS ACCEPTABLE FOR SPUTUM CULTURE  Final   Report Status 04/03/2015 FINAL  Final  Culture, respiratory (NON-Expectorated)     Status: None (Preliminary result)   Collection Time: 04/03/15  5:17 AM  Result Value Ref Range Status   Specimen Description SPUTUM  Final   Special Requests NONE Reflexed from [id number]  Final   Gram Stain   Final    FEW WBC SEEN FEW GRAM POSITIVE COCCI IN CHAINS GOOD SPECIMEN - 80-90% WBCS    Culture PENDING  Incomplete   Report Status PENDING  Incomplete          IMAGING    Dg Chest 2 View  04/03/2015   CLINICAL DATA:  Right upper chest pain, fever, productive cough. Duration 1 week.  EXAM: CHEST  2 VIEW  COMPARISON:  04/20/2013  FINDINGS: There is a right upper lobe cavitary lesion with air-fluid level, measuring approximately 5 x 7 cm. This is new from 04/20/2013. This could represent a lung abscess. Superinfection of a pre-existing bulla would also be a possibility although no significant bullous disease is evident in  this area on the prior study. A cavitary neoplasm is less likely. No pleural effusion is evident. Minimal scattered patchy airspace opacities are present elsewhere in the lungs, probably infectious. There is mild anterior wedging of T12, unchanged. There is mild benign pleural thickening in the lateral left hemi thorax and multiple remote healed left rib fracture deformities, unchanged.  IMPRESSION: Right upper lobe cavitary lesion with air-fluid level, suspicious for lung abscess. Recommend chest CT with contrast for characterization.   Electronically Signed   By: Ellery Plunk M.D.   On: 04/03/2015 03:49   Ct Chest W Contrast  04/03/2015   CLINICAL DATA:  Cough and right upper chest pain x 3 days. Known history of COPD, hepatitis C, bipolar disorder, history of alcohol use in the past presents to the emergency room with the complaints of cough with production of greenish yellow sputum for the past 3-4 days. Admits having fever. No night sweats. No hemoptysis. No shortness of breath. No wheezing. Also complains of right-sided chest pain increased with respirations. Admits he has been having poor appetite with weight loss for the past few weeks. Felt dizzy and had a near-syncopal episode yesterday  EXAM: CT CHEST WITH CONTRAST  TECHNIQUE: Multidetector CT imaging of the chest was performed during intravenous contrast administration.  CONTRAST:  75mL OMNIPAQUE IOHEXOL 350 MG/ML SOLN  COMPARISON:  Current chest radiograph which shows an air-fluid level and opacity in the right upper lobe.  FINDINGS: Thoracic inlet:. No mass or adenopathy. Visualized thyroid is unremarkable.  Mediastinum and hila: There is mediastinal adenopathy. Largest node is a right peritracheal, azygos level node measuring 14 mm in short axis. Several other prominent lymph nodes are noted along the right peritracheal mediastinum. No discrete masses. No left hilar adenopathy. There is abnormal soft tissue along the right hilum consistent  with ill-defined adenopathy. Heart is normal in size. Great vessels are normal in caliber. No coronary artery calcifications.  Lungs and pleura: Large cavitary lesion lies in the left upper lobe extending from the superior margin of the right hilum to the right apex. It measures approximately 10.8 x 7.8 x 7.9 cm. Walls of the cavitary lesion are not well-defined and where seen appear irregular. There are bubbles of air within  the fluid as well as an air-fluid level anteriorly. The apical segmental branch to the left upper lobe appears occluded. There is mild narrowing of the anterior segmental branch near its origin. Below this, extending from the inferior right hilum along the posterior inferior margin of the right upper lobe, is a focal low-density masslike opacity measuring 3.2 x 2.0 x 3.4 cm. A smaller area of similar density is seen peripheral to this which abuts the oblique and minor fissures and a lateral pleural margin. There are other areas of reticular opacity and hazy ground-glass opacity most evident adjacent to the cavitary lesion and in the posterior right upper lobe. Ill-defined reticular nodular opacities, which have a tree-in-bud type appearance, lie in the inferior aspect of the right upper lobe. There are less prominent but similar appearing opacities in the posterior lateral right middle lobe. Focal spiculated type opacity is noted in the posterior right lower lobe measuring 17 mm in greatest dimension. In the left upper lobe there is a small irregular opacity which measures 15 mm in greatest transverse dimension. There is contiguous linear and reticular opacity extending to the pleural margin of the lateral left upper lobe near the apex. There are other areas of reticular opacity there consistent with scarring or minimal subsegmental atelectasis. Right lung is relatively hyperexpanded when compared to the left. Minimal pleural effusions. On the left, calcifications lie along the visceral and  parietal pleura of the margins of the pleural effusion.  Limited upper abdomen: Heterogeneous attenuation of the spleen likely DT differential arterial enhancement. No convincing mass. No liver mass or adrenal mass.  Musculoskeletal: Mild compression deformity of T12, which appears chronic. Old left rib fractures and left scapular fractures. No osteoblastic or osteolytic lesions.  IMPRESSION: 1. Large cavitary lesion in the right upper lobe measuring 10.8 cm in greatest dimension. There are other lung abnormalities as detailed above. Findings may reflect neoplastic disease. Infection, particularly atypical infection such as MAI or tuberculosis, is suspected. Findings could reflect a combination of both infection and neoplastic disease. There is associated right mediastinal and hilar adenopathy. 2. Small chronic pleural effusion on the left likely a remote empyema or hemothorax. Hemothorax supported by old left rib and left scapular fractures.   Electronically Signed   By: Amie Portland M.D.   On: 04/03/2015 12:22      ASSESSMENT/PLAN   54 yo white male admitted for sepsis most likely  from acute RUL cavitary/necrotizing pneumonia, however, Primary lung cancer also a possibility  1.continue oxygen as needed 2.continue inhaled meds for COPD 3.continue current abx 4.plan for Bronch on Friday airway exam and BAL sampling    I have personally obtained a history, examined the patient, evaluated laboratory and independently reviewed imaging results, formulated the assessment and plan and placed orders.  The Patient requires high complexity decision making for assessment and support, frequent evaluation and titration of therapies, application of advanced monitoring technologies and extensive interpretation of multiple databases.  Patient  is satisfied with Plan of action and management. All questions answered   Lucie Leather, M.D.  [facility]  Medical Director  [facility] Medical Director [facility] Cardio-Pulmonary Department

## 2015-04-03 NOTE — ED Notes (Signed)
Pt taken to room 19 via w/c; placed in gown, on card monitor and O2 at 2l/min via [place]; report given to Dr Derrill Kay and care nurse

## 2015-04-03 NOTE — Consult Note (Signed)
[facility] Infectious Disease     Reason for Consult:Cavitary PNA    Referring Physician: Fritzi Mandes Date of Admission:  04/03/2015   Principal Problem:   Abscess of upper lobe of right lung without pneumonia ([place]) Active Problems:   COPD (chronic obstructive pulmonary disease) ([place])   Hep C w/o coma, chronic ([place])   Bipolar disorder (HCC)   HPI: Perry Bullock is a 54 y.o. male  with a known history of COPD, hepatitis C, bipolar disorder, history of alcohol use admitted 10/4 with the complaints of cough with production of greenish yellow sputum for the past 3-4 days. Admits having fever,  night sweats. No hemoptysis. intermittent shortness of breath. No wheezing.  Has had poor appetite with weight loss of 30 pounds for the past few months On admit had white blood cell count of 30.7. Chest x-ray right upper lobe cavitary lesion with air-fluid levels suspicious for lung abscess.  Has been started on abx with vanco and zosyn. Sputum being submitted for AFB. Has seen pulm He states his girlfriend is a Marine scientist and has had + TB skin test in past but not sure if she had active TB or was treated for LTBI.  He has no other known Tb contact.  He does report syncopal episodes and a hx of seizures.   Past Medical History  Diagnosis Date  . Bipolar 1 disorder ([place])   . Chronic back pain   . Asthma   . COPD (chronic obstructive pulmonary disease) ([place])   . Hepatitis    Past Surgical History  Procedure Laterality Date  . Eye surgery    . Mandible surgery     Social History  Substance Use Topics  . Smoking status: Former Research scientist (life sciences)  . Smokeless tobacco: None  . Alcohol Use: No   Family History  Problem Relation Age of Onset  . Breast cancer Mother   . Hypertension Father   . Diabetes Father     Allergies: No Known Allergies  Current antibiotics: Antibiotics Given (last 72 hours)    Date/Time Action Medication Dose Rate   04/03/15 0939 Given   piperacillin-tazobactam (ZOSYN)  IVPB 3.375 g 3.375 g 12.5 mL/hr   04/03/15 1227 Given   vancomycin (VANCOCIN) IVPB 1000 mg/200 mL premix 1,000 mg 200 mL/hr      MEDICATIONS: . albuterol  2.5 mg Nebulization Q6H  . aspirin EC  81 mg Oral Daily  . enoxaparin (LOVENOX) injection  40 mg Subcutaneous Q24H  . fluticasone  1 spray Each Nare Daily  . gabapentin  1,200 mg Oral BID  . [START ON 04/04/2015] Influenza vac split quadrivalent PF  0.5 mL Intramuscular Tomorrow-1000  . latanoprost  1 drop Both Eyes QHS  . loratadine  10 mg Oral Daily  . mometasone-formoterol  2 puff Inhalation BID  . mometasone-formoterol  2 puff Inhalation BID  . pantoprazole  40 mg Oral Daily  . piperacillin-tazobactam (ZOSYN)  IV  3.375 g Intravenous 3 times per day  . QUEtiapine  200 mg Oral BID  . sodium chloride  3 mL Intravenous Q12H  . tiotropium  18 mcg Inhalation Daily  . traZODone  100 mg Oral TID  . vancomycin  1,000 mg Intravenous Q12H    Review of Systems - 11 systems reviewed and negative per HPI   OBJECTIVE: Temp:  [96 F (35.6 C)-99.7 F (37.6 C)] 96 F (35.6 C) (10/04 0756) Pulse Rate:  [91-129] 93 (10/04 0756) Resp:  [12-23] 18 (10/04 0756) BP: (100-113)/(58-84)  110/58 mmHg (10/04 0756) SpO2:  [93 %-100 %] 100 % (10/04 0756) FiO2 (%):  [21 %] 21 % (10/04 0732) Weight:  [73.936 kg (163 lb)] 73.936 kg (163 lb) (10/04 0246) Physical Exam  Constitutional: He is oriented to person, place, and time. Thin  HENT: R eye irregular and cloudy Mouth/Throat: Oropharynx is clear and moist. No oropharyngeal exudate.  Cardiovascular: Normal rate, regular rhythm and normal heart sounds. Pulmonary/Chest: poor air movement, rhonchi Abdominal: Soft. Bowel sounds are normal. He exhibits no distension. There is no tenderness.  Lymphadenopathy:  He has no cervical adenopathy.  Neurological: He is alert and oriented to person, place, and time.  Skin: Skin is warm and dry. No rash noted. No erythema.  Psychiatric: He has a normal mood  and affect. His behavior is normal.     LABS: Results for orders placed or performed during the hospital encounter of 04/03/15 (from the past 48 hour(s))  CBC with Differential     Status: Abnormal   Collection Time: 04/03/15  3:09 AM  Result Value Ref Range   WBC 30.7 (H) 3.8 - 10.6 K/uL   RBC 3.44 (L) 4.40 - 5.90 MIL/uL   Hemoglobin 10.4 (L) 13.0 - 18.0 g/dL   HCT 31.8 (L) 40.0 - 52.0 %   MCV 92.7 80.0 - 100.0 fL   MCH 30.3 26.0 - 34.0 pg   MCHC 32.7 32.0 - 36.0 g/dL   RDW 13.6 11.5 - 14.5 %   Platelets 448 (H) 150 - 440 K/uL   Neutrophils Relative % 86 %   Lymphocytes Relative 6 %   Monocytes Relative 8 %   Eosinophils Relative 0 %   Basophils Relative 0 %   Neutro Abs 26.4 (H) 1.4 - 6.5 K/uL   Lymphs Abs 1.8 1.0 - 3.6 K/uL   Monocytes Absolute 2.5 (H) 0.2 - 1.0 K/uL   Eosinophils Absolute 0.0 0 - 0.7 K/uL   Basophils Absolute 0.0 0 - 0.1 K/uL   RBC Morphology RARE NRBCs    Smear Review PLATELETS APPEAR ADEQUATE   Basic metabolic panel     Status: Abnormal   Collection Time: 04/03/15  3:09 AM  Result Value Ref Range   Sodium 134 (L) 135 - 145 mmol/L   Potassium 4.1 3.5 - 5.1 mmol/L    Comment: HEMOLYSIS AT THIS LEVEL MAY AFFECT RESULT   Chloride 100 (L) 101 - 111 mmol/L   CO2 24 22 - 32 mmol/L   Glucose, Bld 142 (H) 65 - 99 mg/dL   BUN 10 6 - 20 mg/dL   Creatinine, Ser 1.06 0.61 - 1.24 mg/dL   Calcium 8.7 (L) 8.9 - 10.3 mg/dL   GFR calc non Af Amer >60 >60 mL/min   GFR calc Af Amer >60 >60 mL/min    Comment: (NOTE) The eGFR has been calculated using the CKD EPI equation. This calculation has not been validated in all clinical situations. eGFR's persistently <60 mL/min signify possible Chronic Kidney Disease.    Anion gap 10 5 - 15  Iron and TIBC     Status: Abnormal   Collection Time: 04/03/15  3:09 AM  Result Value Ref Range   Iron 22 (L) 45 - 182 ug/dL    Comment: HEMOLYSIS AT THIS LEVEL MAY AFFECT RESULT   TIBC 152 (L) 250 - 450 ug/dL   Saturation Ratios  15 (L) 17.9 - 39.5 %   UIBC 130 ug/dL  Ferritin     Status: Abnormal   Collection Time: 04/03/15  3:09 AM  Result Value Ref Range   Ferritin 604 (H) 24 - 336 ng/mL  Culture, expectorated sputum-assessment     Status: None   Collection Time: 04/03/15  5:17 AM  Result Value Ref Range   Specimen Description SPUTUM    Special Requests NONE    Sputum evaluation THIS SPECIMEN IS ACCEPTABLE FOR SPUTUM CULTURE    Report Status 04/03/2015 FINAL   Culture, respiratory (NON-Expectorated)     Status: None (Preliminary result)   Collection Time: 04/03/15  5:17 AM  Result Value Ref Range   Specimen Description SPUTUM    Special Requests NONE Reflexed from [id number]    Gram Stain      FEW WBC SEEN FEW GRAM POSITIVE COCCI IN CHAINS GOOD SPECIMEN - 80-90% WBCS    Culture PENDING    Report Status PENDING    No components found for: ESR, C REACTIVE PROTEIN MICRO: Recent Results (from the past 720 hour(s))  Culture, expectorated sputum-assessment     Status: None   Collection Time: 04/03/15  5:17 AM  Result Value Ref Range Status   Specimen Description SPUTUM  Final   Special Requests NONE  Final   Sputum evaluation THIS SPECIMEN IS ACCEPTABLE FOR SPUTUM CULTURE  Final   Report Status 04/03/2015 FINAL  Final  Culture, respiratory (NON-Expectorated)     Status: None (Preliminary result)   Collection Time: 04/03/15  5:17 AM  Result Value Ref Range Status   Specimen Description SPUTUM  Final   Special Requests NONE Reflexed from [id number]  Final   Gram Stain   Final    FEW WBC SEEN FEW GRAM POSITIVE COCCI IN CHAINS GOOD SPECIMEN - 80-90% WBCS    Culture PENDING  Incomplete   Report Status PENDING  Incomplete    IMAGING: Dg Chest 2 View  04/03/2015   CLINICAL DATA:  Right upper chest pain, fever, productive cough. Duration 1 week.  EXAM: CHEST  2 VIEW  COMPARISON:  04/20/2013  FINDINGS: There is a right upper lobe cavitary lesion with air-fluid level, measuring approximately 5 x 7 cm. This is  new from 04/20/2013. This could represent a lung abscess. Superinfection of a pre-existing bulla would also be a possibility although no significant bullous disease is evident in this area on the prior study. A cavitary neoplasm is less likely. No pleural effusion is evident. Minimal scattered patchy airspace opacities are present elsewhere in the lungs, probably infectious. There is mild anterior wedging of T12, unchanged. There is mild benign pleural thickening in the lateral left hemi thorax and multiple remote healed left rib fracture deformities, unchanged.  IMPRESSION: Right upper lobe cavitary lesion with air-fluid level, suspicious for lung abscess. Recommend chest CT with contrast for characterization.   Electronically Signed   By: Andreas Newport M.D.   On: 04/03/2015 03:49   Ct Chest W Contrast  04/03/2015   CLINICAL DATA:  Cough and right upper chest pain x 3 days. Known history of COPD, hepatitis C, bipolar disorder, history of alcohol use in the past presents to the emergency room with the complaints of cough with production of greenish yellow sputum for the past 3-4 days. Admits having fever. No night sweats. No hemoptysis. No shortness of breath. No wheezing. Also complains of right-sided chest pain increased with respirations. Admits he has been having poor appetite with weight loss for the past few weeks. Felt dizzy and had a near-syncopal episode yesterday  EXAM: CT CHEST WITH CONTRAST  TECHNIQUE: Multidetector CT imaging of the  chest was performed during intravenous contrast administration.  CONTRAST:  22m OMNIPAQUE IOHEXOL 350 MG/ML SOLN  COMPARISON:  Current chest radiograph which shows an air-fluid level and opacity in the right upper lobe.  FINDINGS: Thoracic inlet:. No mass or adenopathy. Visualized thyroid is unremarkable.  Mediastinum and hila: There is mediastinal adenopathy. Largest node is a right peritracheal, azygos level node measuring 14 mm in short axis. Several other  prominent lymph nodes are noted along the right peritracheal mediastinum. No discrete masses. No left hilar adenopathy. There is abnormal soft tissue along the right hilum consistent with ill-defined adenopathy. Heart is normal in size. Great vessels are normal in caliber. No coronary artery calcifications.  Lungs and pleura: Large cavitary lesion lies in the left upper lobe extending from the superior margin of the right hilum to the right apex. It measures approximately 10.8 x 7.8 x 7.9 cm. Walls of the cavitary lesion are not well-defined and where seen appear irregular. There are bubbles of air within the fluid as well as an air-fluid level anteriorly. The apical segmental branch to the left upper lobe appears occluded. There is mild narrowing of the anterior segmental branch near its origin. Below this, extending from the inferior right hilum along the posterior inferior margin of the right upper lobe, is a focal low-density masslike opacity measuring 3.2 x 2.0 x 3.4 cm. A smaller area of similar density is seen peripheral to this which abuts the oblique and minor fissures and a lateral pleural margin. There are other areas of reticular opacity and hazy ground-glass opacity most evident adjacent to the cavitary lesion and in the posterior right upper lobe. Ill-defined reticular nodular opacities, which have a tree-in-bud type appearance, lie in the inferior aspect of the right upper lobe. There are less prominent but similar appearing opacities in the posterior lateral right middle lobe. Focal spiculated type opacity is noted in the posterior right lower lobe measuring 17 mm in greatest dimension. In the left upper lobe there is a small irregular opacity which measures 15 mm in greatest transverse dimension. There is contiguous linear and reticular opacity extending to the pleural margin of the lateral left upper lobe near the apex. There are other areas of reticular opacity there consistent with scarring or  minimal subsegmental atelectasis. Right lung is relatively hyperexpanded when compared to the left. Minimal pleural effusions. On the left, calcifications lie along the visceral and parietal pleura of the margins of the pleural effusion.  Limited upper abdomen: Heterogeneous attenuation of the spleen likely DT differential arterial enhancement. No convincing mass. No liver mass or adrenal mass.  Musculoskeletal: Mild compression deformity of T12, which appears chronic. Old left rib fractures and left scapular fractures. No osteoblastic or osteolytic lesions.  IMPRESSION: 1. Large cavitary lesion in the right upper lobe measuring 10.8 cm in greatest dimension. There are other lung abnormalities as detailed above. Findings may reflect neoplastic disease. Infection, particularly atypical infection such as MAI or tuberculosis, is suspected. Findings could reflect a combination of both infection and neoplastic disease. There is associated right mediastinal and hilar adenopathy. 2. Small chronic pleural effusion on the left likely a remote empyema or hemothorax. Hemothorax supported by old left rib and left scapular fractures.   Electronically Signed   By: DLajean ManesM.D.   On: 04/03/2015 12:22    Assessment:   GNOELL SHULARis a 54y.o. male with a known history of COPD, hepatitis C, bipolar disorder, history of alcohol use admitted 10/4 with weight  loss, cough, fevers, wbc 30, cavitary pna on CT chest . Diff includes malignancy, TB, Fungal, Necrotizing PNA.  Recommendations Await cx Check HIV + QF gold Cont vanco zosyn For Bronch 10/7  Thank you very much for allowing me to participate in the care of this patient. Please call with questions.   Cheral Marker. Ola Spurr, MD

## 2015-04-03 NOTE — H&P (Signed)
[facility] - [place] [facility]   PATIENT NAME: Perry Bullock    MR#:  [id number]  DATE OF BIRTH:  1961/03/18  DATE OF ADMISSION:  04/03/2015  PRIMARY CARE PHYSICIAN: [facility]   REQUESTING/REFERRING PHYSICIAN: Dr. Derrill Kay  CHIEF COMPLAINT:   Chief Complaint  Patient presents with  . Cough  . Loss of Consciousness  . Chest Pain   Cough with expectoration of green sputum Right-sided chest pain. HISTORY OF PRESENT ILLNESS:  Perry Bullock  is a 54 y.o. male with a known history of COPD, hepatitis C, bipolar disorder, history of alcohol use in the past presents to the emergency room with the complaints of cough with production of greenish yellow sputum for the past 3-4 days. Admits having fever. No night sweats. No hemoptysis. No shortness of breath. No wheezing. Also complains of right-sided chest pain increased with respirations. Admits he has been having poor appetite with weight loss for the past few weeks. Felt dizzy and had a near-syncopal episode earlier yesterday. Today has some nausea but no vomitings no diarrhea. No abdominal pain. Evaluation in the ED revealed elevated white blood cell count of 30.7, H&H 10.4 over 31.8. Chest x-ray right upper lobe cavitary lesion with air-fluid levels suspicious for lung abscess. EKG sinus tachycardia with ventricular rate of 1 26 bpm with fusion complexes, right bundle branch block. After obtaining blood and sputum cultures patient was started on broad-spectrum antibiotics-vancomycin and Zosyn, was placed on negative pressure room and hospitalist service was consulted for further management. Patient at the current time is comfortably resting in the bed states he is feeling slightly better.  PAST MEDICAL HISTORY:   Past Medical History  Diagnosis Date  . Bipolar 1 disorder (HCC)   . Chronic back pain   . Asthma   . COPD (chronic obstructive pulmonary disease) (HCC)   . Hepatitis     PAST  SURGICAL HISTORY:   Past Surgical History  Procedure Laterality Date  . Eye surgery    . Mandible surgery      SOCIAL HISTORY:   Social History  Substance Use Topics  . Smoking status: Current Every Day Smoker  . Smokeless tobacco: Not on file  . Alcohol Use: Yes    FAMILY HISTORY:   Family History  Problem Relation Age of Onset  . Breast cancer Mother   . Hypertension Father   . Diabetes Father     DRUG ALLERGIES:  No Known Allergies  REVIEW OF SYSTEMS:   Review of Systems  Constitutional: Positive for fever, weight loss, malaise/fatigue and diaphoresis. Negative for chills.  HENT: Negative for ear pain, hearing loss, nosebleeds, sore throat and tinnitus.   Eyes: Negative for blurred vision, double vision, pain, discharge and redness.  Respiratory: Positive for cough and sputum production. Negative for hemoptysis, shortness of breath and wheezing.   Cardiovascular: Negative for chest pain, palpitations, orthopnea and leg swelling.  Gastrointestinal: Positive for nausea. Negative for vomiting, abdominal pain, diarrhea, constipation, blood in stool and melena.  Genitourinary: Negative for dysuria, urgency, frequency and hematuria.  Musculoskeletal: Negative for back pain, joint pain and neck pain.  Skin: Negative for itching and rash.  Neurological: Negative for dizziness, tingling, sensory change, focal weakness and seizures.  Endo/Heme/Allergies: Does not bruise/bleed easily.  Psychiatric/Behavioral: Positive for depression. The patient is not nervous/anxious.     MEDICATIONS AT HOME:   Prior to Admission medications   Medication Sig Start Date End Date Taking? Authorizing Provider  albuterol (  PROVENTIL HFA;VENTOLIN HFA) 108 (90 BASE) MCG/ACT inhaler Inhale 1 puff into the lungs every 6 (six) hours as needed for wheezing or shortness of breath.   Yes Historical Provider, MD  cetirizine (ZYRTEC) 10 MG tablet Take 10 mg by mouth daily.   Yes Historical Provider, MD   fluticasone (FLONASE) 50 MCG/ACT nasal spray Place 1 spray into both nostrils daily.   Yes Historical Provider, MD  gabapentin (NEURONTIN) 300 MG capsule Take 1,200 mg by mouth 2 (two) times daily.   Yes Historical Provider, MD  latanoprost (XALATAN) 0.005 % ophthalmic solution Place 1 drop into both eyes at bedtime.   Yes Historical Provider, MD  mometasone-formoterol (DULERA) 100-5 MCG/ACT AERO Inhale 2 puffs into the lungs 2 (two) times daily.   Yes Historical Provider, MD  omeprazole (PRILOSEC) 20 MG capsule Take 1 capsule (20 mg total) by mouth daily. 02/15/15 02/15/16 Yes Gayla Doss, MD  ondansetron (ZOFRAN ODT) 4 MG disintegrating tablet Take 1 tablet (4 mg total) by mouth every 8 (eight) hours as needed for nausea or vomiting. 02/15/15  Yes Gayla Doss, MD  pantoprazole (PROTONIX) 40 MG tablet Take 40 mg by mouth daily.   Yes Historical Provider, MD  QUEtiapine (SEROQUEL XR) 200 MG 24 hr tablet Take 200 mg by mouth 2 (two) times daily.   Yes Historical Provider, MD  tiotropium (SPIRIVA) 18 MCG inhalation capsule Place 18 mcg into inhaler and inhale daily.   Yes Historical Provider, MD  traZODone (DESYREL) 100 MG tablet Take 100 mg by mouth 3 (three) times daily.   Yes Historical Provider, MD  zolpidem (AMBIEN) 10 MG tablet Take 10 mg by mouth at bedtime as needed for sleep.   Yes Historical Provider, MD      VITAL SIGNS:  Blood pressure 100/72, pulse 91, temperature 99.7 F (37.6 C), temperature source Oral, resp. rate 14, height 6' (1.829 m), weight 73.936 kg (163 lb), SpO2 100 %.  PHYSICAL EXAMINATION:  Physical Exam  Constitutional: He is oriented to person, place, and time. No distress.  Chronically looking,  HENT:  Head: Normocephalic and atraumatic.  Right Ear: External ear normal.  Left Ear: External ear normal.  Nose: Nose normal.  Mouth/Throat: Oropharynx is clear and moist. No oropharyngeal exudate.  Eyes: EOM are normal. Pupils are equal, round, and reactive to  light. No scleral icterus.  Neck: Normal range of motion. Neck supple. No JVD present. No thyromegaly present.  Cardiovascular: Regular rhythm, normal heart sounds and intact distal pulses.  Exam reveals no friction rub.   No murmur heard. Tachycardia +  Respiratory: Effort normal. No respiratory distress. He has no wheezes. He has rales (Right upper lobe). He exhibits no tenderness.  GI: Soft. Bowel sounds are normal. He exhibits no distension and no mass. There is no tenderness. There is no rebound and no guarding.  Musculoskeletal: Normal range of motion. He exhibits no edema.  Lymphadenopathy:    He has no cervical adenopathy.  Neurological: He is alert and oriented to person, place, and time. He has normal reflexes. He displays normal reflexes. No cranial nerve deficit. He exhibits normal muscle tone.  Skin: Skin is warm. No rash noted. No erythema.  Psychiatric: He has a normal mood and affect. His behavior is normal. Thought content normal.   LABORATORY PANEL:   CBC  Recent Labs Lab 04/03/15 0309  WBC 30.7*  HGB 10.4*  HCT 31.8*  PLT 448*   ------------------------------------------------------------------------------------------------------------------  Chemistries   Recent Labs Lab 04/03/15 0309  NA 134*  K 4.1  CL 100*  CO2 24  GLUCOSE 142*  BUN 10  CREATININE 1.06  CALCIUM 8.7*   ------------------------------------------------------------------------------------------------------------------  Cardiac Enzymes No results for input(s): TROPONINI in the last 168 hours. ------------------------------------------------------------------------------------------------------------------  RADIOLOGY:  Dg Chest 2 View  04/03/2015   CLINICAL DATA:  Right upper chest pain, fever, productive cough. Duration 1 week.  EXAM: CHEST  2 VIEW  COMPARISON:  04/20/2013  FINDINGS: There is a right upper lobe cavitary lesion with air-fluid level, measuring approximately 5 x 7 cm.  This is new from 04/20/2013. This could represent a lung abscess. Superinfection of a pre-existing bulla would also be a possibility although no significant bullous disease is evident in this area on the prior study. A cavitary neoplasm is less likely. No pleural effusion is evident. Minimal scattered patchy airspace opacities are present elsewhere in the lungs, probably infectious. There is mild anterior wedging of T12, unchanged. There is mild benign pleural thickening in the lateral left hemi thorax and multiple remote healed left rib fracture deformities, unchanged.  IMPRESSION: Right upper lobe cavitary lesion with air-fluid level, suspicious for lung abscess. Recommend chest CT with contrast for characterization.   Electronically Signed   By: Ellery Plunk M.D.   On: 04/03/2015 03:49    EKG:   Orders placed or performed during the hospital encounter of 04/03/15  . EKG 12-Lead  . EKG 12-Lead  . ED EKG  . ED EKG  Sinus tachycardia with ventricular rate of 1 26 bpm with fusion, axis, right bundle branch block.  IMPRESSION AND PLAN:   1. Cough with expectoration of wheezing or sputum for the past 3-4 days. Chest x-ray significant for right upper lobe cavitary lesion with air-fluid level suggestive of lung abscess. Differential diagnosis include rule out staph pneumonia, rule out pulmonary tuberculosis, rule out fungal abscess. Plan: Admit, continue respiratory isolation with negative pressure room, continue IV antibiotics-vancomycin and Zosyn, O2 supplementation. Sputum for acid-fast bacilli, sputum culture and sensitivity, follow-up cultures, CBC. CT of the chest requested. Infectious disease consultation and pulmonary consultation requested for further evaluation and advice. 2. COPD, stable on home medications. Plan: DuoNeb's, O2 supplementation, continue home medications. 3. History of hep C, stable. Check LFTs. Follow-up accordingly. 4. Bipolar disorder/depression, stable on home  medications. Continue same. 5. Anemia, etiology not known. Plan: Iron studies, stool for occult blood 3. Consider further workup accordingly. Need further workup including a GI workup given the history of weight loss and nonspecific GI symptoms.  DVT prophylaxis: Subcutaneous Lovenox.  All the records are reviewed and case discussed with ED provider. Management plans discussed with the patient, family and they are in agreement.  CODE STATUS: Full code  TOTAL TIME TAKING CARE OF THIS PATIENT: 50 minutes.    Jonnie Kind N M.D on 04/03/2015 at 5:58 AM  Between 7am to 6pm - Pager - [phone]  After 6pm go to [email or web address] - password [email or web address] [facility]  [place] [facility]  Office  [phone]  CC: Primary care physician; [facility]

## 2015-04-03 NOTE — ED Notes (Signed)
Report given to Sherilyn Cooter, RN for continuity of care.  Pt moved to negative pressure room, Room 1.

## 2015-04-03 NOTE — ED Provider Notes (Signed)
[facility] Emergency Department Provider Note   ____________________________________________  Time seen: 0400  I have reviewed the triage vital signs and the nursing notes.   HISTORY  Chief Complaint Cough; Loss of Consciousness; and Chest Pain   History limited by: Not Limited   HPI Perry Bullock is a 54 y.o. male who presents to the emergency department today with concern for cough, shortness of breath. Patient states that he has been having these symptoms for 1 week. They have progressively gotten worse. He states his cough is productive of some greenish brown foul-smelling phlegm. He states that his cough is gone so bad that he has had fainting episodes. The patient states he has also had fevers during this time. He also endorses weight loss.  Past Medical History  Diagnosis Date  . Bipolar 1 disorder   . Chronic back pain   . Asthma     There are no active problems to display for this patient.   No past surgical history on file.  Current Outpatient Rx  Name  Route  Sig  Dispense  Refill  . albuterol (PROVENTIL HFA;VENTOLIN HFA) 108 (90 BASE) MCG/ACT inhaler   Inhalation   Inhale 1 puff into the lungs every 6 (six) hours as needed for wheezing or shortness of breath.         . cetirizine (ZYRTEC) 10 MG tablet   Oral   Take 10 mg by mouth daily.         . fluticasone (FLONASE) 50 MCG/ACT nasal spray   Each Nare   Place 1 spray into both nostrils daily.         Marland Kitchen gabapentin (NEURONTIN) 300 MG capsule   Oral   Take 1,200 mg by mouth 2 (two) times daily.         Marland Kitchen latanoprost (XALATAN) 0.005 % ophthalmic solution   Both Eyes   Place 1 drop into both eyes at bedtime.         . mometasone-formoterol (DULERA) 100-5 MCG/ACT AERO   Inhalation   Inhale 2 puffs into the lungs 2 (two) times daily.         Marland Kitchen omeprazole (PRILOSEC) 20 MG capsule   Oral   Take 1 capsule (20 mg total) by mouth daily.   30 capsule   1   .  ondansetron (ZOFRAN ODT) 4 MG disintegrating tablet   Oral   Take 1 tablet (4 mg total) by mouth every 8 (eight) hours as needed for nausea or vomiting.   12 tablet   0   . pantoprazole (PROTONIX) 40 MG tablet   Oral   Take 40 mg by mouth daily.         . QUEtiapine (SEROQUEL XR) 200 MG 24 hr tablet   Oral   Take 200 mg by mouth 2 (two) times daily.         Marland Kitchen tiotropium (SPIRIVA) 18 MCG inhalation capsule   Inhalation   Place 18 mcg into inhaler and inhale daily.         . traZODone (DESYREL) 100 MG tablet   Oral   Take 100 mg by mouth 3 (three) times daily.         Marland Kitchen zolpidem (AMBIEN) 10 MG tablet   Oral   Take 10 mg by mouth at bedtime as needed for sleep.           Allergies Review of patient's allergies indicates no known allergies.  No family history on file.  Social History Social History  Substance Use Topics  . Smoking status: Current Every Day Smoker  . Smokeless tobacco: Not on file  . Alcohol Use: Yes    Review of Systems  Constitutional: Positive for fever. Cardiovascular: Negative for chest pain. Respiratory: Positive for shortness of breath. Gastrointestinal: Negative for abdominal pain, vomiting and diarrhea. Genitourinary: Negative for dysuria. Musculoskeletal: Negative for back pain. Skin: Negative for rash. Neurological: Negative for headaches, focal weakness or numbness.  10-point ROS otherwise negative.  ____________________________________________   PHYSICAL EXAM:  VITAL SIGNS: ED Triage Vitals  Enc Vitals Group     BP 04/03/15 0246 103/84 mmHg     Pulse Rate 04/03/15 0246 129     Resp 04/03/15 0246 18     Temp 04/03/15 0246 99.7 F (37.6 C)     Temp Source 04/03/15 0246 Oral     SpO2 04/03/15 0246 93 %     Weight 04/03/15 0246 163 lb (73.936 kg)     Height 04/03/15 0246 6' (1.829 m)     Head Cir --      Peak Flow --      Pain Score 04/03/15 0246 8   Constitutional: Alert and oriented. Mild increased respiratory  effort. Eyes: Conjunctivae are normal. PERRL. Normal extraocular movements. ENT   Head: Normocephalic and atraumatic.   Nose: No congestion/rhinnorhea.   Mouth/Throat: Mucous membranes are moist.   Neck: No stridor. Hematological/Lymphatic/Immunilogical: No cervical lymphadenopathy. Cardiovascular: Normal rate, regular rhythm.  No murmurs, rubs, or gallops. Respiratory: Increased respiratory effort. Some crackles in the right lung. Gastrointestinal: Soft and nontender. No distention.  Genitourinary: Deferred Musculoskeletal: Normal range of motion in all extremities. No joint effusions.  No lower extremity tenderness nor edema. Neurologic:  Normal speech and language. No gross focal neurologic deficits are appreciated. Speech is normal.  Skin:  Skin is warm, dry and intact. No rash noted. Psychiatric: Mood and affect are normal. Speech and behavior are normal. Patient exhibits appropriate insight and judgment.  ____________________________________________    LABS (pertinent positives/negatives)  Labs Reviewed  CBC WITH DIFFERENTIAL/PLATELET - Abnormal; Notable for the following:    WBC 30.7 (*)    RBC 3.44 (*)    Hemoglobin 10.4 (*)    HCT 31.8 (*)    Platelets 448 (*)    All other components within normal limits  BASIC METABOLIC PANEL - Abnormal; Notable for the following:    Sodium 134 (*)    Chloride 100 (*)    Glucose, Bld 142 (*)    Calcium 8.7 (*)    All other components within normal limits  CULTURE, BLOOD (ROUTINE X 2)  CULTURE, BLOOD (ROUTINE X 2)     ____________________________________________   EKG  I, Phineas Semen, attending physician, personally viewed and interpreted this EKG  EKG Time: 0255 Rate: 126 Rhythm: sinus tachycardia Axis: normal Intervals: qtc 492 QRS: RBBB ST changes: no st elevation Impression: abnormal EKG  ____________________________________________    RADIOLOGY  CXR  IMPRESSION: Right upper lobe cavitary  lesion with air-fluid level, suspicious for lung abscess. Recommend chest CT with contrast for characterization.  I, Deloyd Handy, personally viewed and evaluated these images (plain radiographs) as part of my medical decision making. ____________________________________________   PROCEDURES  Procedure(s) performed: None  Critical Care performed: No  ____________________________________________   INITIAL IMPRESSION / ASSESSMENT AND PLAN / ED COURSE  Pertinent labs & imaging results that were available during my care of the patient were reviewed by me and considered in my medical  decision making (see chart for details).  Patient presented to the emergency department today with chief complaint of cough and shortness of breath. On further history he also endorses fever and weight loss. His chest x-ray is concerning for a cavitary lesion. Patient does have a history of incarceration. Given these constellation of findings I do have some concern for tuberculosis. Did ask that the patient be transferred to a negative pressure room. Discussed the patient with the hospitalist for further admission and further management workup.  ____________________________________________   FINAL CLINICAL IMPRESSION(S) / ED DIAGNOSES  Final diagnoses:  Cavitary lesion of lung  Fever, unspecified fever cause  Leukocytosis     Phineas Semen, MD 04/03/15 [phone]

## 2015-04-03 NOTE — Care Management Note (Signed)
Case Management Note  Patient Details  Name: Perry Bullock MRN: [id number] Date of Birth: Jul 18, 1960  Subjective/Objective:                  Spoke with patient over the phone to discuss discharge planning. He states he lives with his girlfriend [name] which is a Charity fundraiser per patient. He states he is independent with mobility. His PCP is with [facility]. He states his Medicaid covers his Rx and his PCP visits. He denies needs. No history of home health or O2.   Action/Plan: No current RNCM needs.    Expected Discharge Date:  04/06/15               Expected Discharge Plan:     In-House Referral:     Discharge planning Services  CM Consult  Post Acute Care Choice:    Choice offered to:  Patient  DME Arranged:  N/A DME Agency:     HH Arranged:    HH Agency:     Status of Service:  Completed, signed off  Medicare Important Message Given:    Date Medicare IM Given:    Medicare IM give by:    Date Additional Medicare IM Given:    Additional Medicare Important Message give by:     If discussed at Long Length of Stay Meetings, dates discussed:    Additional Comments:  Collie Siad, RN 04/03/2015, 9:21 AM

## 2015-04-03 NOTE — Consult Note (Signed)
ANTIBIOTIC CONSULT NOTE - INITIAL  Pharmacy Consult for vancomycin/zosyn Indication: lung abcess  No Known Allergies  Patient Measurements: Height: 6' (182.9 cm) Weight: 163 lb (73.936 kg) IBW/kg (Calculated) : 77.6 Adjusted Body Weight:   Vital Signs: Temp: 96 F (35.6 C) (10/04 0756) Temp Source: Oral (10/04 0756) BP: 110/58 mmHg (10/04 0756) Pulse Rate: 93 (10/04 0756) Intake/Output from previous day:   Intake/Output from this shift:    Labs:  Recent Labs  04/03/15 0309  WBC 30.7*  HGB 10.4*  PLT 448*  CREATININE 1.06   Estimated Creatinine Clearance: 83.3 mL/min (by C-G formula based on Cr of 1.06). No results for input(s): VANCOTROUGH, VANCOPEAK, VANCORANDOM, GENTTROUGH, GENTPEAK, GENTRANDOM, TOBRATROUGH, TOBRAPEAK, TOBRARND, AMIKACINPEAK, AMIKACINTROU, AMIKACIN in the last 72 hours.   Microbiology: Recent Results (from the past 720 hour(s))  Culture, expectorated sputum-assessment     Status: None   Collection Time: 04/03/15  5:17 AM  Result Value Ref Range Status   Specimen Description SPUTUM  Final   Special Requests NONE  Final   Sputum evaluation THIS SPECIMEN IS ACCEPTABLE FOR SPUTUM CULTURE  Final   Report Status 04/03/2015 FINAL  Final    Medical History: Past Medical History  Diagnosis Date  . Bipolar 1 disorder (HCC)   . Chronic back pain   . Asthma   . COPD (chronic obstructive pulmonary disease) (HCC)   . Hepatitis     Medications:  Scheduled:  . aspirin EC  81 mg Oral Daily  . enoxaparin (LOVENOX) injection  40 mg Subcutaneous Q24H  . fluticasone  1 spray Each Nare Daily  . gabapentin  1,200 mg Oral BID  . ipratropium-albuterol  3 mL Nebulization Q6H  . latanoprost  1 drop Both Eyes QHS  . loratadine  10 mg Oral Daily  . mometasone-formoterol  2 puff Inhalation BID  . pantoprazole  40 mg Oral Daily  . QUEtiapine  200 mg Oral BID  . sodium chloride  3 mL Intravenous Q12H  . tiotropium  18 mcg Inhalation Daily  . traZODone  100  mg Oral TID   Assessment: Pt is a 54 year old male who presents with a lung abcess, cough with greenish yellow sputum, fever. Pharmacy consulted to dose broad spectrum antibiotics vancomycin and zosyn. Pt received one dose of each zosyn 3.375g and vancomycin 1g in the ED around 0400  Goal of Therapy:  Vancomycin trough level 15-20 mcg/ml  Plan:  Measure antibiotic drug levels at steady state Follow up culture results continue zosyn 3.375g q 8 hr ei infusion, vancomycin 1g q 12 hours starting 6 hours from last done (1030) Will measure trough at steady state 10/6 @ 2200  Melissa D Maccia 04/03/2015,8:12 AM

## 2015-04-03 NOTE — ED Notes (Addendum)
Pt to triage via w/c with no distress noted; pt reports x week having sinus congestion, productive cough green sputum; pt reports syncopal episodes with coughing; abrasion to bridge of nose from hitting toilet last night; also reports upper CP

## 2015-04-03 NOTE — Progress Notes (Signed)
[facility] [place] [facility]   PATIENT NAME: Perry Bullock    MR#:  [id number]  DATE OF BIRTH:  1960-11-27  SUBJECTIVE:  Came in with productive cough and fever at home. Complains of right upper chest pain.  REVIEW OF SYSTEMS:   Review of Systems  Constitutional: Positive for fever and malaise/fatigue. Negative for chills and weight loss.  HENT: Negative for ear discharge, ear pain and nosebleeds.   Eyes: Negative for blurred vision, pain and discharge.  Respiratory: Positive for cough and shortness of breath. Negative for sputum production, wheezing and stridor.   Cardiovascular: Negative for chest pain, palpitations, orthopnea and PND.  Gastrointestinal: Negative for nausea, vomiting, abdominal pain and diarrhea.  Genitourinary: Negative for urgency and frequency.  Musculoskeletal: Negative for back pain and joint pain.  Neurological: Positive for weakness. Negative for sensory change, speech change and focal weakness.  Psychiatric/Behavioral: Negative for depression and hallucinations. The patient is not nervous/anxious.   All other systems reviewed and are negative.  Tolerating Diet: Yes Tolerating PT: Not needed  DRUG ALLERGIES:  No Known Allergies  VITALS:  Blood pressure 110/58, pulse 93, temperature 96 F (35.6 C), temperature source Oral, resp. rate 18, height 6' (1.829 m), weight 73.936 kg (163 lb), SpO2 100 %.  PHYSICAL EXAMINATION:   Physical Exam  GENERAL:  54 y.o.-year-old patient lying in the bed with no acute distress. Thin EYES: Pupils equal, round, reactive to light and accommodation. No scleral icterus. Extraocular muscles intact. Right eye opaque cornea HEENT: Head atraumatic, normocephalic. Oropharynx and nasopharynx clear. Poor oral hygiene NECK:  Supple, no jugular venous distention. No thyroid enlargement, no tenderness.  LUNGS: Normal breath sounds bilaterally, no wheezing, rales, rhonchi. No use of accessory muscles  of respiration.  CARDIOVASCULAR: S1, S2 normal. No murmurs, rubs, or gallops.  ABDOMEN: Soft, nontender, nondistended. Bowel sounds present. No organomegaly or mass.  EXTREMITIES: No cyanosis, clubbing or edema b/l.    NEUROLOGIC: Cranial nerves II through XII are intact. No focal Motor or sensory deficits b/l.   PSYCHIATRIC: The patient is alert and oriented x 3.  SKIN: No obvious rash, lesion, or ulcer.    LABORATORY PANEL:   CBC  Recent Labs Lab 04/03/15 0309  WBC 30.7*  HGB 10.4*  HCT 31.8*  PLT 448*    Chemistries   Recent Labs Lab 04/03/15 0309  NA 134*  K 4.1  CL 100*  CO2 24  GLUCOSE 142*  BUN 10  CREATININE 1.06  CALCIUM 8.7*    Cardiac Enzymes No results for input(s): TROPONINI in the last 168 hours.  RADIOLOGY:  Dg Chest 2 View  04/03/2015   CLINICAL DATA:  Right upper chest pain, fever, productive cough. Duration 1 week.  EXAM: CHEST  2 VIEW  COMPARISON:  04/20/2013  FINDINGS: There is a right upper lobe cavitary lesion with air-fluid level, measuring approximately 5 x 7 cm. This is new from 04/20/2013. This could represent a lung abscess. Superinfection of a pre-existing bulla would also be a possibility although no significant bullous disease is evident in this area on the prior study. A cavitary neoplasm is less likely. No pleural effusion is evident. Minimal scattered patchy airspace opacities are present elsewhere in the lungs, probably infectious. There is mild anterior wedging of T12, unchanged. There is mild benign pleural thickening in the lateral left hemi thorax and multiple remote healed left rib fracture deformities, unchanged.  IMPRESSION: Right upper lobe cavitary lesion with air-fluid level, suspicious for lung  abscess. Recommend chest CT with contrast for characterization.   Electronically Signed   By: Ellery Plunk M.D.   On: 04/03/2015 03:49   Ct Chest W Contrast  04/03/2015   CLINICAL DATA:  Cough and right upper chest pain x 3 days.  Known history of COPD, hepatitis C, bipolar disorder, history of alcohol use in the past presents to the emergency room with the complaints of cough with production of greenish yellow sputum for the past 3-4 days. Admits having fever. No night sweats. No hemoptysis. No shortness of breath. No wheezing. Also complains of right-sided chest pain increased with respirations. Admits he has been having poor appetite with weight loss for the past few weeks. Felt dizzy and had a near-syncopal episode yesterday  EXAM: CT CHEST WITH CONTRAST  TECHNIQUE: Multidetector CT imaging of the chest was performed during intravenous contrast administration.  CONTRAST:  75mL OMNIPAQUE IOHEXOL 350 MG/ML SOLN  COMPARISON:  Current chest radiograph which shows an air-fluid level and opacity in the right upper lobe.  FINDINGS: Thoracic inlet:. No mass or adenopathy. Visualized thyroid is unremarkable.  Mediastinum and hila: There is mediastinal adenopathy. Largest node is a right peritracheal, azygos level node measuring 14 mm in short axis. Several other prominent lymph nodes are noted along the right peritracheal mediastinum. No discrete masses. No left hilar adenopathy. There is abnormal soft tissue along the right hilum consistent with ill-defined adenopathy. Heart is normal in size. Great vessels are normal in caliber. No coronary artery calcifications.  Lungs and pleura: Large cavitary lesion lies in the left upper lobe extending from the superior margin of the right hilum to the right apex. It measures approximately 10.8 x 7.8 x 7.9 cm. Walls of the cavitary lesion are not well-defined and where seen appear irregular. There are bubbles of air within the fluid as well as an air-fluid level anteriorly. The apical segmental branch to the left upper lobe appears occluded. There is mild narrowing of the anterior segmental branch near its origin. Below this, extending from the inferior right hilum along the posterior inferior margin of the  right upper lobe, is a focal low-density masslike opacity measuring 3.2 x 2.0 x 3.4 cm. A smaller area of similar density is seen peripheral to this which abuts the oblique and minor fissures and a lateral pleural margin. There are other areas of reticular opacity and hazy ground-glass opacity most evident adjacent to the cavitary lesion and in the posterior right upper lobe. Ill-defined reticular nodular opacities, which have a tree-in-bud type appearance, lie in the inferior aspect of the right upper lobe. There are less prominent but similar appearing opacities in the posterior lateral right middle lobe. Focal spiculated type opacity is noted in the posterior right lower lobe measuring 17 mm in greatest dimension. In the left upper lobe there is a small irregular opacity which measures 15 mm in greatest transverse dimension. There is contiguous linear and reticular opacity extending to the pleural margin of the lateral left upper lobe near the apex. There are other areas of reticular opacity there consistent with scarring or minimal subsegmental atelectasis. Right lung is relatively hyperexpanded when compared to the left. Minimal pleural effusions. On the left, calcifications lie along the visceral and parietal pleura of the margins of the pleural effusion.  Limited upper abdomen: Heterogeneous attenuation of the spleen likely DT differential arterial enhancement. No convincing mass. No liver mass or adrenal mass.  Musculoskeletal: Mild compression deformity of T12, which appears chronic. Old left rib fractures  and left scapular fractures. No osteoblastic or osteolytic lesions.  IMPRESSION: 1. Large cavitary lesion in the right upper lobe measuring 10.8 cm in greatest dimension. There are other lung abnormalities as detailed above. Findings may reflect neoplastic disease. Infection, particularly atypical infection such as MAI or tuberculosis, is suspected. Findings could reflect a combination of both infection  and neoplastic disease. There is associated right mediastinal and hilar adenopathy. 2. Small chronic pleural effusion on the left likely a remote empyema or hemothorax. Hemothorax supported by old left rib and left scapular fractures.   Electronically Signed   By: Amie Portland M.D.   On: 04/03/2015 12:22     ASSESSMENT AND PLAN:  54 y.o. male with a known history of COPD, hepatitis C, bipolar disorder, history of alcohol use in the past presents to the emergency room with the complaints of cough with production of greenish yellow sputum for the past 3-4 days.   1. right-sided cavitary lesion most likely lung abscess -. Differential diagnosis include rule out strep pneumonia, rule out pulmonary tuberculosis, rule out fungal abscess. -Possible aspiration patient has significant dental problems. - continue respiratory isolation with negative pressure room, -continue IV antibiotics-vancomycin and Zosyn, O2 supplementation.  -Sputum for acid-fast bacilli, sputum culture and sensitivity, follow-up cultures, - CT of the chest requested. - Infectious disease consultation spoke with Dr. Sampson Goon and pulmonary consultation spoke with Dr.Ram requested for further evaluation and advice.  2. COPD, stable on home medications.  - DuoNeb's, O2 supplementation, continue home medications.  3. History of hep C, stable. Check LFTs. Follow-up accordingly.  4. Bipolar disorder/depression, stable on home medications. Continue same.  5. Anemia, etiology not known. Plan: Iron studies, stool for occult blood 3. Consider further workup accordingly. Need further workup including a GI workup given the history of weight loss and nonspecific GI symptoms.  Case discussed with Care Management/Social Worker. Management plans discussed with the patient, family and they are in agreement.  CODE STATUS: Full  DVT Prophylaxis: Lovenox  TOTAL TIME TAKING CARE OF THIS PATIENT: 35 minutes.  >50% time spent on counselling  and coordination of care patient, pulmonary, ID  POSSIBLE D/C IN few DAYS, DEPENDING ON CLINICAL CONDITION.   Kabria Hetzer M.D on 04/03/2015 at 12:40 PM  Between 7am to 6pm - Pager - [phone]  After 6pm go to [email or web address] - password [email or web address] [facility]  [place]  Office  [phone]  CC: Primary care physician; [facility]

## 2015-04-03 NOTE — ED Notes (Signed)
Called report to take the Pt to the floor, but room was no ready.

## 2015-04-03 NOTE — Progress Notes (Signed)
Returned call for report. Henry,RN  unavailable.

## 2015-04-04 LAB — COMPREHENSIVE METABOLIC PANEL
ALBUMIN: 2 g/dL — AB (ref 3.5–5.0)
ALK PHOS: 123 U/L (ref 38–126)
ALT: 16 U/L — AB (ref 17–63)
AST: 21 U/L (ref 15–41)
Anion gap: 8 (ref 5–15)
BILIRUBIN TOTAL: 0.4 mg/dL (ref 0.3–1.2)
BUN: 10 mg/dL (ref 6–20)
CALCIUM: 8.8 mg/dL — AB (ref 8.9–10.3)
CO2: 26 mmol/L (ref 22–32)
CREATININE: 1.06 mg/dL (ref 0.61–1.24)
Chloride: 105 mmol/L (ref 101–111)
GFR calc Af Amer: >60 mL/min (ref >60)
GFR calc non Af Amer: >60 mL/min (ref >60)
GLUCOSE: 122 mg/dL — AB (ref 65–99)
Potassium: 4.1 mmol/L (ref 3.5–5.1)
SODIUM: 139 mmol/L (ref 135–145)
Total Protein: 6.4 g/dL — ABNORMAL LOW (ref 6.5–8.1)

## 2015-04-04 LAB — CBC
HEMATOCRIT: 29.2 % — AB (ref 40.0–52.0)
HEMOGLOBIN: 9.5 g/dL — AB (ref 13.0–18.0)
MCH: 30.5 pg (ref 26.0–34.0)
MCHC: 32.7 g/dL (ref 32.0–36.0)
MCV: 93.2 fL (ref 80.0–100.0)
Platelets: 453 10*3/uL — ABNORMAL HIGH (ref 150–440)
RBC: 3.13 MIL/uL — AB (ref 4.40–5.90)
RDW: 13.9 % (ref 11.5–14.5)
WBC: 20.7 10*3/uL — ABNORMAL HIGH (ref 3.8–10.6)

## 2015-04-04 NOTE — Progress Notes (Signed)
Pt. Alert and oriented. Febrile last night. Tylenol administered and temp reduced. Receiving IV antibiotics. New IV site started d/t infiltration. Up ambulating to bathroom with stand-by assist. Tolerating PO's. Healthy appetite. Resting quietly. Will continue to monitor.

## 2015-04-04 NOTE — Progress Notes (Signed)
PROBLEMS: Lung abscess  SUBJ: No new complaints No distress Continues to cough up copious gray purulent sputum  OBJ: Filed Vitals:   04/04/15 0242 04/04/15 0526 04/04/15 0739 04/04/15 0858  BP:  124/66  110/58  Pulse:  107  112  Temp:  102.7 F (39.3 C)  99.8 F (37.7 C)  TempSrc:  Oral  Oral  Resp:  18  18  Height:      Weight:  162 lb 12.8 oz (73.845 kg)    SpO2: 96% 94% 93% 91%   NAD HEENT: no change Neck: NO LAN, no JVD noted Lungs: full BS, normal percussion note throughout, few rhonchi, no wheezes Cardiovascular: Reg rate, normal rhythm, no M noted Abdomen: Soft, NT +BS Ext: no C/C/E    DATA: BMP Latest Ref Rng 04/04/2015 04/03/2015 02/15/2015  Glucose 65 - 99 mg/dL 409(W) 119(J) 478(G)  BUN 6 - 20 mg/dL Creatinine 0.61 - 1.24 mg/dL 9.56 2.13 0.86  Sodium 135 - 145 mmol/L 139 134(L) 134(L)  Potassium 3.5 - 5.1 mmol/L 4.1 4.1 4.6  Chloride 101 - 111 mmol/L 105 100(L) 97(L)  CO2 22 - 32 mmol/L Calcium 8.9 - 10.3 mg/dL 5.7(Q) 4.6(N) 9.3    CBC Latest Ref Rng 04/04/2015 04/03/2015 02/15/2015  WBC 3.8 - 10.6 K/uL 20.7(H) 30.7(H) 9.3  Hemoglobin 13.0 - 18.0 g/dL 6.2(X) 10.4(L) 14.2  Hematocrit 40.0 - 52.0 % 29.2(L) 31.8(L) 43.4  Platelets 150 - 440 K/uL 453(H) 448(H) 309    No new CXR  IMPRESSION: RUL lung abscess Doubt TB  PLAN: Discussed with Dr Sampson Goon Discussed with Dr Belia Heman Follow up resp culture He will need at least 6 wks abx - amox-clav is usually a good choice in this setting Recheck CXR 10/06 I have scheduled follow up in pulmonary clinic in 2-3 wks for repeat CXR and re-eval A bronchoscopy might be indicated in the future if abscess is nonresolving  Billy Fischer, MD PCCM service Mobile [phone] Pager [phone]

## 2015-04-04 NOTE — Progress Notes (Signed)
[facility] CLINIC INFECTIOUS DISEASE PROGRESS NOTE Date of Admission:  04/03/2015     ID: Perry Bullock is a 54 y.o. male with cavitary lesion.  Principal Problem:   Abscess of upper lobe of right lung without pneumonia (HCC) Active Problems:   COPD (chronic obstructive pulmonary disease) (HCC)   Hep C w/o coma, chronic (HCC)   Bipolar disorder (HCC)  Subjective: Febrile to 102, wbc down, still with cough.  Still with thick sputum, some pleuritic pain on R  ROS  Eleven systems are reviewed and negative except per hpi  Medications:  Antibiotics Given (last 72 hours)    Date/Time Action Medication Dose Rate   04/03/15 0939 Given   piperacillin-tazobactam (ZOSYN) IVPB 3.375 g 3.375 g 12.5 mL/hr   04/03/15 1227 Given   vancomycin (VANCOCIN) IVPB 1000 mg/200 mL premix 1,000 mg 200 mL/hr   04/03/15 1754 Given   piperacillin-tazobactam (ZOSYN) IVPB 3.375 g 3.375 g 12.5 mL/hr   04/03/15 2211 Given   vancomycin (VANCOCIN) IVPB 1000 mg/200 mL premix 1,000 mg 200 mL/hr   04/04/15 1610 Given   piperacillin-tazobactam (ZOSYN) IVPB 3.375 g 3.375 g 12.5 mL/hr   04/04/15 0901 Given   piperacillin-tazobactam (ZOSYN) IVPB 3.375 g 3.375 g 12.5 mL/hr   04/04/15 0916 Given   vancomycin (VANCOCIN) IVPB 1000 mg/200 mL premix 1,000 mg 200 mL/hr     . albuterol  2.5 mg Nebulization Q6H  . aspirin EC  81 mg Oral Daily  . enoxaparin (LOVENOX) injection  40 mg Subcutaneous Q24H  . fluticasone  1 spray Each Nare Daily  . gabapentin  1,200 mg Oral BID  . latanoprost  1 drop Both Eyes QHS  . loratadine  10 mg Oral Daily  . mometasone-formoterol  2 puff Inhalation BID  . pantoprazole  40 mg Oral Daily  . piperacillin-tazobactam (ZOSYN)  IV  3.375 g Intravenous 3 times per day  . QUEtiapine  200 mg Oral BID  . sodium chloride  3 mL Intravenous Q12H  . tiotropium  18 mcg Inhalation Daily  . traZODone  100 mg Oral TID  . vancomycin  1,000 mg Intravenous Q12H    Objective: Vital signs in last 24  hours: Temp:  [99.4 F (37.4 C)-102.7 F (39.3 C)] 99.8 F (37.7 C) (10/05 0858) Pulse Rate:  [96-119] 112 (10/05 0858) Resp:  [16-18] 18 (10/05 0858) BP: (100-124)/(58-66) 110/58 mmHg (10/05 0858) SpO2:  [91 %-96 %] 91 % (10/05 0858) Weight:  [73.845 kg (162 lb 12.8 oz)] 73.845 kg (162 lb 12.8 oz) (10/05 0526) Constitutional: He is oriented to person, place, and time. Thin  HENT: R eye irregular and cloudy Mouth/Throat: Oropharynx is clear and moist. No oropharyngeal exudate.  Cardiovascular: Normal rate, regular rhythm and normal heart sounds. Pulmonary/Chest: poor air movement, rhonchi Abdominal: Soft. Bowel sounds are normal. He exhibits no distension. There is no tenderness.  Lymphadenopathy:  He has no cervical adenopathy.  Neurological: He is alert and oriented to person, place, and time.  Skin: Skin is warm and dry. No rash noted. No erythema.  Psychiatric: He has a normal mood and affect. His behavior is normal.  Lab Results  Recent Labs  04/03/15 0309 04/04/15 0523  WBC 30.7* 20.7*  HGB 10.4* 9.5*  HCT 31.8* 29.2*  NA 134* 139  K 4.1 4.1  CL 100* 105  CO2 24 26  BUN 10 10  CREATININE 1.06 1.06    Microbiology: Results for orders placed or performed during the hospital encounter of 04/03/15  Blood culture (routine x 2)     Status: None (Preliminary result)   Collection Time: 04/03/15  3:58 AM  Result Value Ref Range Status   Specimen Description BLOOD BLOOD RIGHT FOREARM  Final   Special Requests BOTTLES DRAWN AEROBIC AND ANAEROBIC  Final   Culture NO GROWTH 1 DAY  Final   Report Status PENDING  Incomplete  Blood culture (routine x 2)     Status: None (Preliminary result)   Collection Time: 04/03/15  3:58 AM  Result Value Ref Range Status   Specimen Description BLOOD RIGHT ANTECUBITAL  Final   Special Requests BOTTLES DRAWN AEROBIC AND ANAEROBIC  Final   Culture NO GROWTH 1 DAY  Final   Report Status PENDING  Incomplete  Culture, expectorated  sputum-assessment     Status: None   Collection Time: 04/03/15  5:17 AM  Result Value Ref Range Status   Specimen Description SPUTUM  Final   Special Requests NONE  Final   Sputum evaluation THIS SPECIMEN IS ACCEPTABLE FOR SPUTUM CULTURE  Final   Report Status 04/03/2015 FINAL  Final  Culture, respiratory (NON-Expectorated)     Status: None (Preliminary result)   Collection Time: 04/03/15  5:17 AM  Result Value Ref Range Status   Specimen Description SPUTUM  Final   Special Requests NONE Reflexed from [id number]  Final   Gram Stain   Final    FEW WBC SEEN FEW GRAM POSITIVE COCCI IN CHAINS GOOD SPECIMEN - 80-90% WBCS    Culture HOLDING FOR POSSIBLE PATHOGEN  Final   Report Status PENDING  Incomplete  Culture, sputum-assessment     Status: None   Collection Time: 04/03/15 12:17 PM  Result Value Ref Range Status   Specimen Description EXPECTORATED SPUTUM  Final   Special Requests NONE  Final   Sputum evaluation THIS SPECIMEN IS ACCEPTABLE FOR SPUTUM CULTURE  Final   Report Status 04/03/2015 FINAL  Final  Culture, respiratory (NON-Expectorated)     Status: None (Preliminary result)   Collection Time: 04/03/15 12:17 PM  Result Value Ref Range Status   Specimen Description EXPECTORATED SPUTUM  Final   Special Requests NONE Reflexed from [id number]  Final   Gram Stain PENDING  Incomplete   Culture HOLDING FOR POSSIBLE PATHOGEN  Final   Report Status PENDING  Incomplete    Studies/Results: Dg Chest 2 View  04/03/2015   CLINICAL DATA:  Right upper chest pain, fever, productive cough. Duration 1 week.  EXAM: CHEST  2 VIEW  COMPARISON:  04/20/2013  FINDINGS: There is a right upper lobe cavitary lesion with air-fluid level, measuring approximately 5 x 7 cm. This is new from 04/20/2013. This could represent a lung abscess. Superinfection of a pre-existing bulla would also be a possibility although no significant bullous disease is evident in this area on the prior study. A cavitary neoplasm is less  likely. No pleural effusion is evident. Minimal scattered patchy airspace opacities are present elsewhere in the lungs, probably infectious. There is mild anterior wedging of T12, unchanged. There is mild benign pleural thickening in the lateral left hemi thorax and multiple remote healed left rib fracture deformities, unchanged.  IMPRESSION: Right upper lobe cavitary lesion with air-fluid level, suspicious for lung abscess. Recommend chest CT with contrast for characterization.   Electronically Signed   By: Ellery Plunk M.D.   On: 04/03/2015 03:49   Ct Chest W Contrast  04/03/2015   CLINICAL DATA:  Cough and right upper chest pain x 3 days.  Known history of COPD, hepatitis C, bipolar disorder, history of alcohol use in the past presents to the emergency room with the complaints of cough with production of greenish yellow sputum for the past 3-4 days. Admits having fever. No night sweats. No hemoptysis. No shortness of breath. No wheezing. Also complains of right-sided chest pain increased with respirations. Admits he has been having poor appetite with weight loss for the past few weeks. Felt dizzy and had a near-syncopal episode yesterday  EXAM: CT CHEST WITH CONTRAST  TECHNIQUE: Multidetector CT imaging of the chest was performed during intravenous contrast administration.  CONTRAST:  75mL OMNIPAQUE IOHEXOL 350 MG/ML SOLN  COMPARISON:  Current chest radiograph which shows an air-fluid level and opacity in the right upper lobe.  FINDINGS: Thoracic inlet:. No mass or adenopathy. Visualized thyroid is unremarkable.  Mediastinum and hila: There is mediastinal adenopathy. Largest node is a right peritracheal, azygos level node measuring 14 mm in short axis. Several other prominent lymph nodes are noted along the right peritracheal mediastinum. No discrete masses. No left hilar adenopathy. There is abnormal soft tissue along the right hilum consistent with ill-defined adenopathy. Heart is normal in size. Great  vessels are normal in caliber. No coronary artery calcifications.  Lungs and pleura: Large cavitary lesion lies in the left upper lobe extending from the superior margin of the right hilum to the right apex. It measures approximately 10.8 x 7.8 x 7.9 cm. Walls of the cavitary lesion are not well-defined and where seen appear irregular. There are bubbles of air within the fluid as well as an air-fluid level anteriorly. The apical segmental branch to the left upper lobe appears occluded. There is mild narrowing of the anterior segmental branch near its origin. Below this, extending from the inferior right hilum along the posterior inferior margin of the right upper lobe, is a focal low-density masslike opacity measuring 3.2 x 2.0 x 3.4 cm. A smaller area of similar density is seen peripheral to this which abuts the oblique and minor fissures and a lateral pleural margin. There are other areas of reticular opacity and hazy ground-glass opacity most evident adjacent to the cavitary lesion and in the posterior right upper lobe. Ill-defined reticular nodular opacities, which have a tree-in-bud type appearance, lie in the inferior aspect of the right upper lobe. There are less prominent but similar appearing opacities in the posterior lateral right middle lobe. Focal spiculated type opacity is noted in the posterior right lower lobe measuring 17 mm in greatest dimension. In the left upper lobe there is a small irregular opacity which measures 15 mm in greatest transverse dimension. There is contiguous linear and reticular opacity extending to the pleural margin of the lateral left upper lobe near the apex. There are other areas of reticular opacity there consistent with scarring or minimal subsegmental atelectasis. Right lung is relatively hyperexpanded when compared to the left. Minimal pleural effusions. On the left, calcifications lie along the visceral and parietal pleura of the margins of the pleural effusion.   Limited upper abdomen: Heterogeneous attenuation of the spleen likely DT differential arterial enhancement. No convincing mass. No liver mass or adrenal mass.  Musculoskeletal: Mild compression deformity of T12, which appears chronic. Old left rib fractures and left scapular fractures. No osteoblastic or osteolytic lesions.  IMPRESSION: 1. Large cavitary lesion in the right upper lobe measuring 10.8 cm in greatest dimension. There are other lung abnormalities as detailed above. Findings may reflect neoplastic disease. Infection, particularly atypical infection such as MAI  or tuberculosis, is suspected. Findings could reflect a combination of both infection and neoplastic disease. There is associated right mediastinal and hilar adenopathy. 2. Small chronic pleural effusion on the left likely a remote empyema or hemothorax. Hemothorax supported by old left rib and left scapular fractures.   Electronically Signed   By: Amie Portland M.D.   On: 04/03/2015 12:22    Assessment/Plan: Perry Bullock is a 54 y.o. male with a known history of COPD, hepatitis C, bipolar disorder, history of alcohol use admitted 10/4 with weight loss, cough, fevers, wbc 30, cavitary pna on CT chest . Diff includes malignancy, TB, Fungal, Necrotizing PNA.  Recommendations Await cx - holding for possible pathogen Negative HIV  Await  QF gold Cont vanco zosyn - will tailor based on culture. Agree with Dr Sung Amabile to hold bronch unless worsens Thank you very much for the consult. Will follow with you.  Perry Bullock   04/04/2015, 2:17 PM

## 2015-04-04 NOTE — Progress Notes (Addendum)
[facility] [place] at [facility]   PATIENT NAME: Perry Bullock    MR#:  [id number]  DATE OF BIRTH:  Oct 03, 1960  SUBJECTIVE:  Came in with productive cough and fever at home. Complains of right upper chest pain.  REVIEW OF SYSTEMS:   Review of Systems  Constitutional: Positive for fever and malaise/fatigue. Negative for chills and weight loss.  HENT: Negative for ear discharge, ear pain and nosebleeds.   Eyes: Negative for blurred vision, pain and discharge.  Respiratory: Positive for cough and shortness of breath. Negative for sputum production, wheezing and stridor.   Cardiovascular: Negative for chest pain, palpitations, orthopnea and PND.  Gastrointestinal: Negative for nausea, vomiting, abdominal pain and diarrhea.  Genitourinary: Negative for urgency and frequency.  Musculoskeletal: Negative for back pain and joint pain.  Neurological: Positive for weakness. Negative for sensory change, speech change and focal weakness.  Psychiatric/Behavioral: Negative for depression and hallucinations. The patient is not nervous/anxious.   All other systems reviewed and are negative.  Tolerating Diet: Yes Tolerating PT: Not needed  DRUG ALLERGIES:  No Known Allergies  VITALS:  Blood pressure 110/58, pulse 112, temperature 99.8 F (37.7 C), temperature source Oral, resp. rate 18, height 6' (1.829 m), weight 73.845 kg (162 lb 12.8 oz), SpO2 91 %.  PHYSICAL EXAMINATION:   Physical Exam  GENERAL:  54 y.o.-year-old patient lying in the bed with no acute distress. Thin EYES: Pupils equal, round, reactive to light and accommodation. No scleral icterus. Extraocular muscles intact. Right eye opaque cornea HEENT: Head atraumatic, normocephalic. Oropharynx and nasopharynx clear. Poor oral hygiene NECK:  Supple, no jugular venous distention. No thyroid enlargement, no tenderness.  LUNGS: Normal breath sounds bilaterally, no wheezing, rales, rhonchi. No use of accessory  muscles of respiration.  CARDIOVASCULAR: S1, S2 normal. No murmurs, rubs, or gallops.  ABDOMEN: Soft, nontender, nondistended. Bowel sounds present. No organomegaly or mass.  EXTREMITIES: No cyanosis, clubbing or edema b/l.    NEUROLOGIC: Cranial nerves II through XII are intact. No focal Motor or sensory deficits b/l.   PSYCHIATRIC: The patient is alert and oriented x 3.  SKIN: No obvious rash, lesion, or ulcer.    LABORATORY PANEL:   CBC  Recent Labs Lab 04/04/15 0523  WBC 20.7*  HGB 9.5*  HCT 29.2*  PLT 453*    Chemistries   Recent Labs Lab 04/04/15 0523  NA 139  K 4.1  CL 105  CO2 26  GLUCOSE 122*  BUN 10  CREATININE 1.06  CALCIUM 8.8*  AST 21  ALT 16*  ALKPHOS 123  BILITOT 0.4    Cardiac Enzymes No results for input(s): TROPONINI in the last 168 hours.  RADIOLOGY:  Dg Chest 2 View  04/03/2015   CLINICAL DATA:  Right upper chest pain, fever, productive cough. Duration 1 week.  EXAM: CHEST  2 VIEW  COMPARISON:  04/20/2013  FINDINGS: There is a right upper lobe cavitary lesion with air-fluid level, measuring approximately 5 x 7 cm. This is new from 04/20/2013. This could represent a lung abscess. Superinfection of a pre-existing bulla would also be a possibility although no significant bullous disease is evident in this area on the prior study. A cavitary neoplasm is less likely. No pleural effusion is evident. Minimal scattered patchy airspace opacities are present elsewhere in the lungs, probably infectious. There is mild anterior wedging of T12, unchanged. There is mild benign pleural thickening in the lateral left hemi thorax and multiple remote healed left rib fracture deformities,  unchanged.  IMPRESSION: Right upper lobe cavitary lesion with air-fluid level, suspicious for lung abscess. Recommend chest CT with contrast for characterization.   Electronically Signed   By: Ellery Plunk M.D.   On: 04/03/2015 03:49   Ct Chest W Contrast  04/03/2015   CLINICAL  DATA:  Cough and right upper chest pain x 3 days. Known history of COPD, hepatitis C, bipolar disorder, history of alcohol use in the past presents to the emergency room with the complaints of cough with production of greenish yellow sputum for the past 3-4 days. Admits having fever. No night sweats. No hemoptysis. No shortness of breath. No wheezing. Also complains of right-sided chest pain increased with respirations. Admits he has been having poor appetite with weight loss for the past few weeks. Felt dizzy and had a near-syncopal episode yesterday  EXAM: CT CHEST WITH CONTRAST  TECHNIQUE: Multidetector CT imaging of the chest was performed during intravenous contrast administration.  CONTRAST:  75mL OMNIPAQUE IOHEXOL 350 MG/ML SOLN  COMPARISON:  Current chest radiograph which shows an air-fluid level and opacity in the right upper lobe.  FINDINGS: Thoracic inlet:. No mass or adenopathy. Visualized thyroid is unremarkable.  Mediastinum and hila: There is mediastinal adenopathy. Largest node is a right peritracheal, azygos level node measuring 14 mm in short axis. Several other prominent lymph nodes are noted along the right peritracheal mediastinum. No discrete masses. No left hilar adenopathy. There is abnormal soft tissue along the right hilum consistent with ill-defined adenopathy. Heart is normal in size. Great vessels are normal in caliber. No coronary artery calcifications.  Lungs and pleura: Large cavitary lesion lies in the left upper lobe extending from the superior margin of the right hilum to the right apex. It measures approximately 10.8 x 7.8 x 7.9 cm. Walls of the cavitary lesion are not well-defined and where seen appear irregular. There are bubbles of air within the fluid as well as an air-fluid level anteriorly. The apical segmental branch to the left upper lobe appears occluded. There is mild narrowing of the anterior segmental branch near its origin. Below this, extending from the inferior  right hilum along the posterior inferior margin of the right upper lobe, is a focal low-density masslike opacity measuring 3.2 x 2.0 x 3.4 cm. A smaller area of similar density is seen peripheral to this which abuts the oblique and minor fissures and a lateral pleural margin. There are other areas of reticular opacity and hazy ground-glass opacity most evident adjacent to the cavitary lesion and in the posterior right upper lobe. Ill-defined reticular nodular opacities, which have a tree-in-bud type appearance, lie in the inferior aspect of the right upper lobe. There are less prominent but similar appearing opacities in the posterior lateral right middle lobe. Focal spiculated type opacity is noted in the posterior right lower lobe measuring 17 mm in greatest dimension. In the left upper lobe there is a small irregular opacity which measures 15 mm in greatest transverse dimension. There is contiguous linear and reticular opacity extending to the pleural margin of the lateral left upper lobe near the apex. There are other areas of reticular opacity there consistent with scarring or minimal subsegmental atelectasis. Right lung is relatively hyperexpanded when compared to the left. Minimal pleural effusions. On the left, calcifications lie along the visceral and parietal pleura of the margins of the pleural effusion.  Limited upper abdomen: Heterogeneous attenuation of the spleen likely DT differential arterial enhancement. No convincing mass. No liver mass or adrenal mass.  Musculoskeletal: Mild compression deformity of T12, which appears chronic. Old left rib fractures and left scapular fractures. No osteoblastic or osteolytic lesions.  IMPRESSION: 1. Large cavitary lesion in the right upper lobe measuring 10.8 cm in greatest dimension. There are other lung abnormalities as detailed above. Findings may reflect neoplastic disease. Infection, particularly atypical infection such as MAI or tuberculosis, is suspected.  Findings could reflect a combination of both infection and neoplastic disease. There is associated right mediastinal and hilar adenopathy. 2. Small chronic pleural effusion on the left likely a remote empyema or hemothorax. Hemothorax supported by old left rib and left scapular fractures.   Electronically Signed   By: Amie Portland M.D.   On: 04/03/2015 12:22     ASSESSMENT AND PLAN:  54 y.o. male with a known history of COPD, hepatitis C, bipolar disorder, history of alcohol use in the past presents to the emergency room with the complaints of cough with production of greenish yellow sputum for the past 3-4 days.   1. right-sided cavitary lesion most likely lung abscess -. Differential diagnosis include rule out strep pneumonia, rule out pulmonary tuberculosis, ?possible underlying tumor -Possible aspiration patient has significant dental problems. - continue respiratory isolation with negative pressure room -continue IV antibiotics-vancomycin and Zosyn -Sputum for acid-fast bacilli, sputum culture and sensitivity - CT of the chest resulted noted - Infectious disease consultation  with Dr. Sampson Goon and pulmonary consultation  with Dr.Ram noted -Bronch with BAL on Friday  2. COPD, stable on home medications.  - DuoNeb's, O2 supplementation, continue home medications.  3. History of hep C, stable.   4. Bipolar disorder/depression, stable on home medications.   5. Tobacco abuse advised smoking cessation 4 minutes spent  Case discussed with Care Management/Social Worker. Management plans discussed with the patient, family and they are in agreement.  CODE STATUS: Full  DVT Prophylaxis: Lovenox  TOTAL TIME TAKING CARE OF THIS PATIENT: 35 minutes.  >50% time spent on counselling and coordination of care patient, pulmonary, ID  POSSIBLE D/C IN few DAYS, DEPENDING ON CLINICAL CONDITION.   Barrie Sigmund M.D on 04/04/2015 at 1:29 PM  Between 7am to 6pm - Pager - [phone]  After 6pm  go to [email or web address] - password [email or web address]  [facility]  Office  [phone]  CC: Primary care physician; [facility]

## 2015-04-05 ENCOUNTER — Encounter: Payer: Self-pay | Admitting: Orthopedic Surgery

## 2015-04-05 LAB — EXPECTORATED SPUTUM ASSESSMENT W GRAM STAIN, RFLX TO RESP C

## 2015-04-05 LAB — CULTURE, RESPIRATORY W GRAM STAIN: Culture: NORMAL

## 2015-04-05 LAB — EXPECTORATED SPUTUM ASSESSMENT W REFEX TO RESP CULTURE

## 2015-04-05 LAB — CULTURE, RESPIRATORY

## 2015-04-05 MED ORDER — MAGNESIUM HYDROXIDE 400 MG/5ML PO SUSP
30.0000 mL | Freq: Two times a day (BID) | ORAL | Status: DC | PRN
  Administered 2015-04-05: 30 mL via ORAL
  Filled 2015-04-05: qty 30

## 2015-04-05 MED ORDER — BISACODYL 10 MG RE SUPP: 10.0000 mg | Freq: Every day | RECTAL | Status: DC | PRN

## 2015-04-05 MED ORDER — SENNOSIDES-DOCUSATE SODIUM 8.6-50 MG PO TABS
1.0000 | ORAL_TABLET | Freq: Two times a day (BID) | ORAL | Status: DC
  Administered 2015-04-05 – 2015-04-06 (×3): 1 via ORAL
  Filled 2015-04-05 (×3): qty 1

## 2015-04-05 MED ORDER — SODIUM CHLORIDE 0.9 % IV SOLN
3.0000 g | Freq: Four times a day (QID) | INTRAVENOUS | Status: DC
  Administered 2015-04-05 – 2015-04-06 (×4): 3 g via INTRAVENOUS
  Filled 2015-04-05 (×6): qty 3

## 2015-04-05 NOTE — Progress Notes (Addendum)
[facility] - [place] at [facility]   PATIENT NAME: Perry Bullock    MR#:  [id number]  DATE OF BIRTH:  [birth year]  SUBJECTIVE:  Came in with productive cough and fever at home. Complains of right upper chest pain. -still coughing up copious amount of green phlegm.  REVIEW OF SYSTEMS:   Review of Systems  Constitutional: Positive for fever and malaise/fatigue. Negative for chills and weight loss.  HENT: Negative for ear discharge, ear pain and nosebleeds.   Eyes: Negative for blurred vision, pain and discharge.  Respiratory: Positive for cough and shortness of breath. Negative for sputum production, wheezing and stridor.   Cardiovascular: Negative for chest pain, palpitations, orthopnea and PND.  Gastrointestinal: Negative for nausea, vomiting, abdominal pain and diarrhea.  Genitourinary: Negative for urgency and frequency.  Musculoskeletal: Negative for back pain and joint pain.  Neurological: Positive for weakness. Negative for sensory change, speech change and focal weakness.  Psychiatric/Behavioral: Negative for depression and hallucinations. The patient is not nervous/anxious.   All other systems reviewed and are negative.  Tolerating Diet: Yes Tolerating PT: Not needed  DRUG ALLERGIES:  No Known Allergies  VITALS:  Blood pressure 104/60, pulse 114, temperature 99.7 F (37.6 C), temperature source Oral, resp. rate 20, height 6' (1.829 m), weight 70.398 kg (155 lb 3.2 oz), SpO2 92 %.  PHYSICAL EXAMINATION:   Physical Exam  GENERAL:  54 y.o.-year-old patient lying in the bed with no acute distress. Thin EYES: Pupils equal, round, reactive to light and accommodation. No scleral icterus. Extraocular muscles intact. Right eye opaque cornea HEENT: Head atraumatic, normocephalic. Oropharynx and nasopharynx clear. Poor oral hygiene NECK:  Supple, no jugular venous distention. No thyroid enlargement, no tenderness.  LUNGS: Normal breath sounds  bilaterally, no wheezing, rales, rhonchi. No use of accessory muscles of respiration.  CARDIOVASCULAR: S1, S2 normal. No murmurs, rubs, or gallops.  ABDOMEN: Soft, nontender, nondistended. Bowel sounds present. No organomegaly or mass.  EXTREMITIES: No cyanosis, clubbing or edema b/l.    NEUROLOGIC: Cranial nerves II through XII are intact. No focal Motor or sensory deficits b/l.   PSYCHIATRIC: The patient is alert and oriented x 3.  SKIN: No obvious rash, lesion, or ulcer.    LABORATORY PANEL:   CBC  Recent Labs Lab 04/04/15 0523  WBC 20.7*  HGB 9.5*  HCT 29.2*  PLT 453*    Chemistries   Recent Labs Lab 04/04/15 0523  NA 139  K 4.1  CL 105  CO2 26  GLUCOSE 122*  BUN 10  CREATININE 1.06  CALCIUM 8.8*  AST 21  ALT 16*  ALKPHOS 123  BILITOT 0.4    Cardiac Enzymes No results for input(s): TROPONINI in the last 168 hours.  RADIOLOGY:  Ct Chest W Contrast  04/03/2015   CLINICAL DATA:  Cough and right upper chest pain x 3 days. Known history of COPD, hepatitis C, bipolar disorder, history of alcohol use in the past presents to the emergency room with the complaints of cough with production of greenish yellow sputum for the past 3-4 days. Admits having fever. No night sweats. No hemoptysis. No shortness of breath. No wheezing. Also complains of right-sided chest pain increased with respirations. Admits he has been having poor appetite with weight loss for the past few weeks. Felt dizzy and had a near-syncopal episode yesterday  EXAM: CT CHEST WITH CONTRAST  TECHNIQUE: Multidetector CT imaging of the chest was performed during intravenous contrast administration.  CONTRAST:  75mL OMNIPAQUE IOHEXOL 350  MG/ML SOLN  COMPARISON:  Current chest radiograph which shows an air-fluid level and opacity in the right upper lobe.  FINDINGS: Thoracic inlet:. No mass or adenopathy. Visualized thyroid is unremarkable.  Mediastinum and hila: There is mediastinal adenopathy. Largest node is a  right peritracheal, azygos level node measuring 14 mm in short axis. Several other prominent lymph nodes are noted along the right peritracheal mediastinum. No discrete masses. No left hilar adenopathy. There is abnormal soft tissue along the right hilum consistent with ill-defined adenopathy. Heart is normal in size. Great vessels are normal in caliber. No coronary artery calcifications.  Lungs and pleura: Large cavitary lesion lies in the left upper lobe extending from the superior margin of the right hilum to the right apex. It measures approximately 10.8 x 7.8 x 7.9 cm. Walls of the cavitary lesion are not well-defined and where seen appear irregular. There are bubbles of air within the fluid as well as an air-fluid level anteriorly. The apical segmental branch to the left upper lobe appears occluded. There is mild narrowing of the anterior segmental branch near its origin. Below this, extending from the inferior right hilum along the posterior inferior margin of the right upper lobe, is a focal low-density masslike opacity measuring 3.2 x 2.0 x 3.4 cm. A smaller area of similar density is seen peripheral to this which abuts the oblique and minor fissures and a lateral pleural margin. There are other areas of reticular opacity and hazy ground-glass opacity most evident adjacent to the cavitary lesion and in the posterior right upper lobe. Ill-defined reticular nodular opacities, which have a tree-in-bud type appearance, lie in the inferior aspect of the right upper lobe. There are less prominent but similar appearing opacities in the posterior lateral right middle lobe. Focal spiculated type opacity is noted in the posterior right lower lobe measuring 17 mm in greatest dimension. In the left upper lobe there is a small irregular opacity which measures 15 mm in greatest transverse dimension. There is contiguous linear and reticular opacity extending to the pleural margin of the lateral left upper lobe near the  apex. There are other areas of reticular opacity there consistent with scarring or minimal subsegmental atelectasis. Right lung is relatively hyperexpanded when compared to the left. Minimal pleural effusions. On the left, calcifications lie along the visceral and parietal pleura of the margins of the pleural effusion.  Limited upper abdomen: Heterogeneous attenuation of the spleen likely DT differential arterial enhancement. No convincing mass. No liver mass or adrenal mass.  Musculoskeletal: Mild compression deformity of T12, which appears chronic. Old left rib fractures and left scapular fractures. No osteoblastic or osteolytic lesions.  IMPRESSION: 1. Large cavitary lesion in the right upper lobe measuring 10.8 cm in greatest dimension. There are other lung abnormalities as detailed above. Findings may reflect neoplastic disease. Infection, particularly atypical infection such as MAI or tuberculosis, is suspected. Findings could reflect a combination of both infection and neoplastic disease. There is associated right mediastinal and hilar adenopathy. 2. Small chronic pleural effusion on the left likely a remote empyema or hemothorax. Hemothorax supported by old left rib and left scapular fractures.   Electronically Signed   By: Amie Portland M.D.   On: 04/03/2015 12:22     ASSESSMENT AND PLAN:  54 y.o. male with a known history of COPD, hepatitis C, bipolar disorder, history of alcohol use in the past presents to the emergency room with the complaints of cough with production of greenish yellow sputum for the  past 3-4 days.   1. right-sided cavitary lesion most likely lung abscess -Possible aspiration patient has significant dental problems. Vs underlying mass, necrotizing pneumonia - continue respiratory isolation with negative pressure room -continue IV antibiotics-vancomycin and Zosyn -Sputum for acid-fast bacilli, sputum culture and sensitivity - CT of the chest resulted noted - Infectious  disease consultation  with Dr. Sampson Goon and pulmonary consultation  appreciated -Bronch with BAL on Friday  2. COPD, stable on home medications.  - DuoNeb's, O2 supplementation, continue home medications.  3. History of hep C, stable.   4. Bipolar disorder/depression, stable on home medications.   5. Tobacco abuse advised smoking cessation 4 minutes spent  Case discussed with Care Management/Social Worker. Management plans discussed with the patient, family and they are in agreement.  CODE STATUS: Full  DVT Prophylaxis: Lovenox  TOTAL TIME TAKING CARE OF THIS PATIENT: 25 minutes.  >50% time spent on counselling and coordination of care patient, pulmonary, ID  POSSIBLE D/C IN few DAYS, DEPENDING ON CLINICAL CONDITION.   Macgregor Aeschliman M.D on 04/05/2015 at 11:21 AM  Between 7am to 6pm - Pager - [phone]  After 6pm go to [email or web address] - password [email or web address] [facility]  [facility]  Office  [phone]  CC: Primary care physician; [facility]

## 2015-04-05 NOTE — Progress Notes (Signed)
Lab called that sputum labs needed to be re-ordered, orders placed.

## 2015-04-05 NOTE — Care Management (Signed)
PO antibiotic anticipated at discharge per Dr. Sampson Goon ID.

## 2015-04-05 NOTE — Progress Notes (Signed)
ID E note Sputum with nml flora - suggesting aspiration. No resistant organisms noted AFB pending Will dc vanco, change zosyn to unasyn. At dc can change to augmentin  He will need a minimum of 4-6 weeks oral therapy with augmentin. Will plan on repeat CT in 4-6 weeks to assess response.

## 2015-04-05 NOTE — Progress Notes (Addendum)
PROBLEMS: Cavitary Right Lung Mass.  -Possibilities include lung abscess versus lung mass/tumor with cavitation area. The differential also includes immunological disease such as sarcoidosis or Wegener's, would consider if above are excluded. -Continue antibiotics per infectious disease recommendations.  Right Paratracheal and mediastinal lymphadenopathy. -Currently the patient is made nothing by mouth, and is planned to have an endobronchial ultrasound-guided biopsy with bronchoscopy tomorrow with Dr. Belia Heman  COPD/emphysema. -Continue nebulized albuterol and Spiriva. Option would be to also start Advair and Dulera or similar at the time of discharge. -Outpatient follow-up with pulmonary function testing.  Nicotine abuse/smoking. -Discussed the importance of smoking cessation going forward with the patient.   SUBJ: No new complaints No distress Continues to cough up copious gray purulent sputum  OBJ: Filed Vitals:   04/05/15 0454 04/05/15 0500 04/05/15 1335 04/05/15 1509  BP: 104/60   90/60  Pulse: 114   102  Temp: 99.7 F (37.6 C)   99.8 F (37.7 C)  TempSrc: Oral   Oral  Resp: 20   19  Height:      Weight:  70.398 kg (155 lb 3.2 oz)    SpO2: 92%  92% 92%   NAD HEENT: no change Neck: NO LAN, no JVD noted Lungs: full BS, normal percussion note throughout, few rhonchi, no wheezes Cardiovascular: Reg rate, normal rhythm, no M noted Abdomen: Soft, NT +BS Ext: no C/C/E    DATA: BMP Latest Ref Rng 04/04/2015 04/03/2015 02/15/2015  Glucose 65 - 99 mg/dL 161(W) 960(A) 540(J)  BUN 6 - 20 mg/dL Creatinine 0.61 - 1.24 mg/dL 8.11 9.14 7.82  Sodium 135 - 145 mmol/L 139 134(L) 134(L)  Potassium 3.5 - 5.1 mmol/L 4.1 4.1 4.6  Chloride 101 - 111 mmol/L 105 100(L) 97(L)  CO2 22 - 32 mmol/L Calcium 8.9 - 10.3 mg/dL 9.5(A) 2.1(H) 9.3    CBC Latest Ref Rng 04/04/2015 04/03/2015 02/15/2015  WBC 3.8 - 10.6 K/uL 20.7(H) 30.7(H) 9.3  Hemoglobin 13.0 - 18.0 g/dL 0.8(M)  10.4(L) 14.2  Hematocrit 40.0 - 52.0 % 29.2(L) 31.8(L) 43.4  Platelets 150 - 440 K/uL 453(H) 448(H) 309     Deep Nicholos Johns, M.D.  PCCM service

## 2015-04-05 NOTE — Progress Notes (Signed)
AFB smear result from specimen dated 04/03/15 @ 12:17 obtained from [facility], printed and placed in paper chart.  Result will not be available in [facility] until culture is resulted.  Smear result is negative.   Jason Fila, RN, MSN, CIC

## 2015-04-05 NOTE — Progress Notes (Signed)
Perry Bullock scheduled for Bronch on Friday, order was placed to be NPO at [facility] Thursday @ 0001. MD called to verify that NPO needs to begin Friday @ 0001 and resume regular diet for tonight. MD verified. Nursing continues to monitor and assist with ADLs as needed.

## 2015-04-06 LAB — CULTURE, RESPIRATORY W GRAM STAIN: Culture: NORMAL

## 2015-04-06 MED ORDER — ALBUTEROL SULFATE (2.5 MG/3ML) 0.083% IN NEBU: 2.5000 mg | INHALATION_SOLUTION | Freq: Four times a day (QID) | RESPIRATORY_TRACT | Status: DC | PRN

## 2015-04-06 MED ORDER — OXYCODONE-ACETAMINOPHEN 5-325 MG PO TABS
1.0000 | ORAL_TABLET | Freq: Four times a day (QID) | ORAL | Status: DC | PRN
  Administered 2015-04-06: 1 via ORAL
  Filled 2015-04-06: qty 1

## 2015-04-06 MED ORDER — OXYCODONE-ACETAMINOPHEN 5-325 MG PO TABS: 1.0000 | ORAL_TABLET | Freq: Four times a day (QID) | ORAL | Status: DC | PRN

## 2015-04-06 MED ORDER — AMOXICILLIN-POT CLAVULANATE 875-125 MG PO TABS: 1.0000 | ORAL_TABLET | Freq: Two times a day (BID) | ORAL | Status: DC

## 2015-04-06 MED ORDER — AMOXICILLIN-POT CLAVULANATE 875-125 MG PO TABS
1.0000 | ORAL_TABLET | Freq: Two times a day (BID) | ORAL | Status: DC
  Administered 2015-04-06: 1 via ORAL
  Filled 2015-04-06: qty 1

## 2015-04-06 MED ORDER — ONDANSETRON HCL 4 MG PO TABS: 4.0000 mg | ORAL_TABLET | Freq: Three times a day (TID) | ORAL | Status: DC | PRN

## 2015-04-06 MED ORDER — GUAIFENESIN-DM 100-10 MG/5ML PO SYRP: 5.0000 mL | ORAL_SOLUTION | Freq: Four times a day (QID) | ORAL | Status: DC | PRN

## 2015-04-06 NOTE — Discharge Instructions (Signed)
Stop smoking See your dentist ASAP to get your teeth fixed

## 2015-04-06 NOTE — Discharge Summary (Signed)
[facility] - [place] at [facility]   PATIENT NAME: Perry Bullock    MR#:  [id number]  DATE OF BIRTH:  1961-02-07  DATE OF ADMISSION:  04/03/2015 ADMITTING PHYSICIAN: Crissie Figures, MD  DATE OF DISCHARGE: 04/06/15  PRIMARY CARE PHYSICIAN: [facility]    ADMISSION DIAGNOSIS:  Leukocytosis [D72.829] Cavitary lesion of lung [J98.4] Fever, unspecified fever cause [R50.9]  DISCHARGE DIAGNOSIS:  Right upper lobe cavitary lung abscess/necrotizing pneumonia Chronic smoking COPD -stable SECONDARY DIAGNOSIS:   Past Medical History  Diagnosis Date  . Bipolar 1 disorder (HCC)   . Chronic back pain   . Asthma   . COPD (chronic obstructive pulmonary disease) (HCC)   . Hepatitis     HOSPITAL COURSE:  54 y.o. male with a known history of COPD, hepatitis C, bipolar disorder, history of alcohol use in the past presents to the emergency room with the complaints of cough with production of greenish yellow sputum for the past 3-4 days.   1. right-sided cavitary lesion most likely lung abscess -Possible aspiration patient has significant dental problems. Vs underlying mass, necrotizing pneumonia - 1 out of 3 AFB negative -Changed to by mouth Augmentin for 4 weeks per IDs recommendation -Sputum shows moderate gram-positive cocci in clusters and moderate gram-positive rods. Blood cultures negative. - CT of the chest resulted noted - Infectious disease consultation with Dr. Sampson Goon and pulmonary consultation appreciated -Per Dr. Nicanor Bake bronchoscopy will be considered as outpatient if follow-up chest x-ray does not show improvement after 4 weeks of antibiotics Okay from ID standpoint for patient to go home.  2. COPD, stable on home medications.  - DuoNeb's, O2 supplementation, continue home medications.  3. History of hep C, stable.   4. Bipolar disorder/depression, stable on home medications.   Overall better. Patient advised smoking  cessation. Patient also advised to follow-up with his dentist to get his teeth fixed. CONSULTS OBTAINED:  Treatment Team:  Clydie Braun, MD  DRUG ALLERGIES:  No Known Allergies  DISCHARGE MEDICATIONS:   Current Discharge Medication List    START taking these medications   Details  amoxicillin-clavulanate (AUGMENTIN) 875-125 MG tablet Take 1 tablet by mouth every 12 (twelve) hours. 1 tab two times a day for 4 weeks Qty: 56 tablet, Refills: 0    guaiFENesin-dextromethorphan (ROBITUSSIN DM) 100-10 MG/5ML syrup Take 5 mLs by mouth every 6 (six) hours as needed for cough. Qty: 118 mL, Refills: 0    ondansetron (ZOFRAN) 4 MG tablet Take 1 tablet (4 mg total) by mouth every 8 (eight) hours as needed for nausea. Qty: 20 tablet, Refills: 0    oxyCODONE-acetaminophen (PERCOCET/ROXICET) 5-325 MG tablet Take 1 tablet by mouth every 6 (six) hours as needed for moderate pain. Qty: 20 tablet, Refills: 0      CONTINUE these medications which have NOT CHANGED   Details  albuterol (PROVENTIL HFA;VENTOLIN HFA) 108 (90 BASE) MCG/ACT inhaler Inhale 1 puff into the lungs every 6 (six) hours as needed for wheezing or shortness of breath.    cetirizine (ZYRTEC) 10 MG tablet Take 10 mg by mouth daily.    fluticasone (FLONASE) 50 MCG/ACT nasal spray Place 1 spray into both nostrils daily.    gabapentin (NEURONTIN) 300 MG capsule Take 1,200 mg by mouth 2 (two) times daily.    latanoprost (XALATAN) 0.005 % ophthalmic solution Place 1 drop into both eyes at bedtime.    mometasone-formoterol (DULERA) 100-5 MCG/ACT AERO Inhale 2 puffs into the lungs 2 (two) times daily.  omeprazole (PRILOSEC) 20 MG capsule Take 1 capsule (20 mg total) by mouth daily. Qty: 30 capsule, Refills: 1    ondansetron (ZOFRAN ODT) 4 MG disintegrating tablet Take 1 tablet (4 mg total) by mouth every 8 (eight) hours as needed for nausea or vomiting. Qty: 12 tablet, Refills: 0    pantoprazole (PROTONIX) 40 MG tablet Take  40 mg by mouth daily.    QUEtiapine (SEROQUEL XR) 200 MG 24 hr tablet Take 200 mg by mouth 2 (two) times daily.    tiotropium (SPIRIVA) 18 MCG inhalation capsule Place 18 mcg into inhaler and inhale daily.    traZODone (DESYREL) 100 MG tablet Take 100 mg by mouth 3 (three) times daily.    zolpidem (AMBIEN) 10 MG tablet Take 10 mg by mouth at bedtime as needed for sleep.        If you experience worsening of your admission symptoms, develop shortness of breath, life threatening emergency, suicidal or homicidal thoughts you must seek medical attention immediately by calling 911 or calling your MD immediately  if symptoms less severe.  You Must read complete instructions/literature along with all the possible adverse reactions/side effects for all the Medicines you take and that have been prescribed to you. Take any new Medicines after you have completely understood and accept all the possible adverse reactions/side effects.   Please note  You were cared for by a hospitalist during your hospital stay. If you have any questions about your discharge medications or the care you received while you were in the hospital after you are discharged, you can call the unit and asked to speak with the hospitalist on call if the hospitalist that took care of you is not available. Once you are discharged, your primary care physician will handle any further medical issues. Please note that NO REFILLS for any discharge medications will be authorized once you are discharged, as it is imperative that you return to your primary care physician (or establish a relationship with a primary care physician if you do not have one) for your aftercare needs so that they can reassess your need for medications and monitor your lab values. Today   SUBJECTIVE   No new complaints  VITAL SIGNS:  Blood pressure 110/74, pulse 89, temperature 97.2 F (36.2 C), temperature source Oral, resp. rate 18, height 6' (1.829 m), weight  72.984 kg (160 lb 14.4 oz), SpO2 100 %.  I/O:   Intake/Output Summary (Last 24 hours) at 04/06/15 1321 Last data filed at 04/06/15 1005  Gross per 24 hour  Intake    500 ml  Output    825 ml  Net   -325 ml    PHYSICAL EXAMINATION:  GENERAL:  54 y.o.-year-old patient lying in the bed with no acute distress. disheveld EYES: Pupils equal, round, reactive to light and accommodation. No scleral icterus. Extraocular muscles intact. Right corneal opacity HEENT: Head atraumatic, normocephalic. Oropharynx and nasopharynx clear.  NECK:  Supple, no jugular venous distention. No thyroid enlargement, no tenderness.  LUNGS: distant breath sounds bilaterally, no wheezing, rales,rhonchi or crepitation. No use of accessory muscles of respiration.  CARDIOVASCULAR: S1, S2 normal. No murmurs, rubs, or gallops.  ABDOMEN: Soft, non-tender, non-distended. Bowel sounds present. No organomegaly or mass.  EXTREMITIES: No pedal edema, cyanosis, or clubbing.  NEUROLOGIC: Cranial nerves II through XII are intact. Muscle strength 5/5 in all extremities. Sensation intact. Gait not checked.  PSYCHIATRIC: The patient is alert and oriented x 3.  SKIN: No obvious rash, lesion, or  ulcer.   DATA REVIEW:   CBC   Recent Labs Lab 04/04/15 0523  WBC 20.7*  HGB 9.5*  HCT 29.2*  PLT 453*    Chemistries   Recent Labs Lab 04/04/15 0523  NA 139  K 4.1  CL 105  CO2 26  GLUCOSE 122*  BUN 10  CREATININE 1.06  CALCIUM 8.8*  AST 21  ALT 16*  ALKPHOS 123  BILITOT 0.4    Microbiology Results   Recent Results (from the past 240 hour(s))  Blood culture (routine x 2)     Status: None (Preliminary result)   Collection Time: 04/03/15  3:58 AM  Result Value Ref Range Status   Specimen Description BLOOD BLOOD RIGHT FOREARM  Final   Special Requests BOTTLES DRAWN AEROBIC AND ANAEROBIC  Final   Culture NO GROWTH 3 DAYS  Final   Report Status PENDING  Incomplete  Blood culture (routine x 2)     Status: None  (Preliminary result)   Collection Time: 04/03/15  3:58 AM  Result Value Ref Range Status   Specimen Description BLOOD RIGHT ANTECUBITAL  Final   Special Requests BOTTLES DRAWN AEROBIC AND ANAEROBIC  Final   Culture NO GROWTH 3 DAYS  Final   Report Status PENDING  Incomplete  Culture, expectorated sputum-assessment     Status: None   Collection Time: 04/03/15  5:17 AM  Result Value Ref Range Status   Specimen Description SPUTUM  Final   Special Requests NONE  Final   Sputum evaluation THIS SPECIMEN IS ACCEPTABLE FOR SPUTUM CULTURE  Final   Report Status 04/03/2015 FINAL  Final  Culture, respiratory (NON-Expectorated)     Status: None   Collection Time: 04/03/15  5:17 AM  Result Value Ref Range Status   Specimen Description SPUTUM  Final   Special Requests NONE Reflexed from [id number]  Final   Gram Stain   Final    FEW WBC SEEN FEW GRAM POSITIVE COCCI IN CHAINS GOOD SPECIMEN - 80-90% WBCS    Culture APPEARS TO BE NORMAL FLORA  Final   Report Status 04/05/2015 FINAL  Final  Culture, sputum-assessment     Status: None   Collection Time: 04/03/15 12:17 PM  Result Value Ref Range Status   Specimen Description EXPECTORATED SPUTUM  Final   Special Requests NONE  Final   Sputum evaluation THIS SPECIMEN IS ACCEPTABLE FOR SPUTUM CULTURE  Final   Report Status 04/03/2015 FINAL  Final  Culture, respiratory (NON-Expectorated)     Status: None   Collection Time: 04/03/15 12:17 PM  Result Value Ref Range Status   Specimen Description EXPECTORATED SPUTUM  Final   Special Requests NONE Reflexed from [id number]  Final   Gram Stain   Final    GOOD SPECIMEN - 80-90% WBCS FEW WBC SEEN MODERATE GRAM POSITIVE COCCI IN CLUSTERS IN PAIRS MANY GRAM POSITIVE RODS    Culture APPEARS TO BE NORMAL FLORA  Final   Report Status 04/06/2015 FINAL  Final  Culture, expectorated sputum-assessment     Status: None   Collection Time: 04/04/15  1:38 PM  Result Value Ref Range Status   Specimen Description  EXPECTORATED SPUTUM  Final   Special Requests NONE  Final   Sputum evaluation THIS SPECIMEN IS ACCEPTABLE FOR SPUTUM CULTURE  Final   Report Status 04/05/2015 FINAL  Final  Culture, respiratory (NON-Expectorated)     Status: None (Preliminary result)   Collection Time: 04/04/15  1:38 PM  Result Value Ref Range Status  Specimen Description EXPECTORATED SPUTUM  Final   Special Requests NONE Reflexed from [id number]  Final   Gram Stain PENDING  Incomplete   Culture HEAVY GROWTH YEAST IDENTIFICATION TO FOLLOW   Final   Report Status PENDING  Incomplete    RADIOLOGY:  No results found.   Management plans discussed with the patient, family and they are in agreement.  CODE STATUS:     Code Status Orders        Start     Ordered   04/03/15 0732  Full code   Continuous     04/03/15 0732      TOTAL TIME TAKING CARE OF THIS PATIENT: 40 minutes.    Trust Leh M.D on 04/06/2015 at 1:21 PM  Between 7am to 6pm - Pager - [phone] After 6pm go to [email or web address] - password [email or web address] [facility]  [place] [facility]  Office  [phone]  CC: Primary care physician; [facility]

## 2015-04-06 NOTE — Progress Notes (Signed)
Discharge instructions reviewed with the pt and his girlfriend.  Pt has rx for oxycodone.  Pt sent out via wheelchair with belongings

## 2015-04-06 NOTE — Progress Notes (Signed)
Pt requesting to know time of bronch.  Specials told me it had been cancelled.  I spoke with dr Claris Gladden in the CCU who spoke with dr Belia Heman.  Bronch is cancelled. Pt may resume previous diet

## 2015-04-06 NOTE — Progress Notes (Signed)
PROBLEMS: Lung abscess  SUBJ: No new complaints No distress Continues to cough up copious gray purulent sputum, has pleuritic chest pain Will d/c bronchoscopy -await final AFB stains  OBJ: Filed Vitals:   04/05/15 1900 04/05/15 1955 04/06/15 0300 04/06/15 0808  BP: 104/68  95/55 110/74  Pulse: 94  92 89  Temp: 98.5 F (36.9 C)  98.4 F (36.9 C) 97.2 F (36.2 C)  TempSrc: Oral  Oral Oral  Resp: Height:      Weight:   160 lb 14.4 oz (72.984 kg)   SpO2: 94% 93% 94% 100%   NAD HEENT: no change Neck: NO LAN, no JVD noted Lungs: full BS, normal percussion note throughout, few rhonchi, no wheezes Cardiovascular: Reg rate, normal rhythm, no M noted Abdomen: Soft, NT +BS Ext: no C/C/E    DATA: BMP Latest Ref Rng 04/04/2015 04/03/2015 02/15/2015  Glucose 65 - 99 mg/dL 960(A) 540(J) 811(B)  BUN 6 - 20 mg/dL Creatinine 0.61 - 1.24 mg/dL 1.47 8.29 5.62  Sodium 135 - 145 mmol/L 139 134(L) 134(L)  Potassium 3.5 - 5.1 mmol/L 4.1 4.1 4.6  Chloride 101 - 111 mmol/L 105 100(L) 97(L)  CO2 22 - 32 mmol/L Calcium 8.9 - 10.3 mg/dL 1.3(Y) 8.6(V) 9.3    CBC Latest Ref Rng 04/04/2015 04/03/2015 02/15/2015  WBC 3.8 - 10.6 K/uL 20.7(H) 30.7(H) 9.3  Hemoglobin 13.0 - 18.0 g/dL 7.8(I) 10.4(L) 14.2  Hematocrit 40.0 - 52.0 % 29.2(L) 31.8(L) 43.4  Platelets 150 - 440 K/uL 453(H) 448(H) 309    No new CXR  IMPRESSION: RUL lung abscess Doubt TB  PLAN: -Follow up resp culture -He will need at least 6 wks abx - amox-clav is usually a good choice in this setting as per ID -patient to follow up with Dr. Sung Amabile in pulmonary clinic in 2-3 wks for repeat CXR and re-eval A bronchoscopy might be indicated in the future if abscess is nonresolving  -continue BD therapy as prescribed  Will sign off at this time. Please call with any further questions  I have personally obtained a history, examined the patient, evaluated Pertinent laboratory and RadioGraphic/imaging  results, and  formulated the assessment and plan   The Patient requires high complexity decision making for assessment and support, frequent evaluation and titration of therapies.  Patient satisfied with Plan of action and management. All questions answered  Lucie Leather, M.D.  [facility]  Medical Director [facility] [place] Medical Director [facility] Cardio-Pulmonary Department

## 2015-04-06 NOTE — Progress Notes (Signed)
04/06/15 Second AFB smear (specimen from 10/5 @ 13:38)  result obtained from [facility] and taken to nursing unit to be placed in shadow chart.  Result was negative.  Jason Fila, RN, MSN, CIC

## 2015-04-06 NOTE — Care Management (Signed)
Per Dr. Allena Katz patient will need nebulizer which will be delivered by [facility]. No further RNCM needs. Case closed.

## 2015-04-06 NOTE — Progress Notes (Signed)
Pt said that the doctor was going to order him something for the coughing. I informed the pt that he had robitussin for cough.  Dr Belia Heman notified and ordered percocet for pleuritic pain

## 2015-04-07 LAB — QUANTIFERON IN TUBE
QFT TB AG MINUS NIL VALUE: 0.01 IU/mL
QUANTIFERON MITOGEN VALUE: 0.07 IU/mL
QUANTIFERON TB AG VALUE: 0.03 IU/mL
QUANTIFERON TB GOLD: UNDETERMINED
Quantiferon Nil Value: 0.02 IU/mL

## 2015-04-07 LAB — QUANTIFERON TB GOLD ASSAY (BLOOD)

## 2015-04-08 LAB — CULTURE, BLOOD (ROUTINE X 2)
CULTURE: NO GROWTH
Culture: NO GROWTH

## 2015-04-09 ENCOUNTER — Telehealth: Payer: Self-pay | Admitting: *Deleted

## 2015-04-09 DIAGNOSIS — J852 Abscess of lung without pneumonia: Secondary | ICD-10-CM

## 2015-04-09 NOTE — Telephone Encounter (Signed)
-----   Message from Merwyn Katos, MD sent at 04/04/2015  2:02 PM EDT ----- Please schedule office follow up in 2-3 weeks with CXR prior to visit for follow up of lung abscess. Thanks.  Theodoro Grist

## 2015-04-09 NOTE — Telephone Encounter (Signed)
LMOM for pt to call back to schedule f/u appt and CXR.

## 2015-04-10 LAB — CULTURE, RESPIRATORY

## 2015-04-10 LAB — CULTURE, RESPIRATORY W GRAM STAIN

## 2015-04-10 NOTE — Telephone Encounter (Signed)
Had to schedule pt on Nov 1st due to his schedule for f/u. Informed CXR needed prior to appt. Nothing further needed.

## 2015-04-20 ENCOUNTER — Inpatient Hospital Stay: Payer: Medicaid Other | Admitting: Pulmonary Disease

## 2015-04-22 ENCOUNTER — Inpatient Hospital Stay: Payer: Medicaid Other

## 2015-04-22 ENCOUNTER — Inpatient Hospital Stay: Admit: 2015-04-22 | Payer: Self-pay | Admitting: Unknown Physician Specialty

## 2015-04-22 ENCOUNTER — Emergency Department: Payer: Medicaid Other | Admitting: Anesthesiology

## 2015-04-22 ENCOUNTER — Encounter
Admission: EM | Disposition: A | Discharge status: Home / Self Care | Payer: Self-pay | Source: Home / Self Care | Attending: Specialist

## 2015-04-22 ENCOUNTER — Inpatient Hospital Stay
Admission: EM | Admit: 2015-04-22 | Discharge: 2015-04-25 | DRG: 915 | Disposition: A | Discharge status: Home / Self Care | Payer: Medicaid Other | Attending: Specialist | Admitting: Specialist

## 2015-04-22 ENCOUNTER — Encounter: Payer: Self-pay | Admitting: Emergency Medicine

## 2015-04-22 ENCOUNTER — Inpatient Hospital Stay (HOSPITAL_COMMUNITY)
Admit: 2015-04-22 | Discharge: 2015-04-22 | Disposition: A | Discharge status: Home / Self Care | Payer: Medicaid Other | Attending: Internal Medicine | Admitting: Internal Medicine

## 2015-04-22 DIAGNOSIS — R0600 Dyspnea, unspecified: Secondary | ICD-10-CM

## 2015-04-22 DIAGNOSIS — G8929 Other chronic pain: Secondary | ICD-10-CM | POA: Diagnosis present

## 2015-04-22 DIAGNOSIS — J449 Chronic obstructive pulmonary disease, unspecified: Secondary | ICD-10-CM | POA: Diagnosis present

## 2015-04-22 DIAGNOSIS — Z79899 Other long term (current) drug therapy: Secondary | ICD-10-CM

## 2015-04-22 DIAGNOSIS — F1010 Alcohol abuse, uncomplicated: Secondary | ICD-10-CM | POA: Diagnosis present

## 2015-04-22 DIAGNOSIS — J45909 Unspecified asthma, uncomplicated: Secondary | ICD-10-CM | POA: Diagnosis present

## 2015-04-22 DIAGNOSIS — M549 Dorsalgia, unspecified: Secondary | ICD-10-CM | POA: Diagnosis present

## 2015-04-22 DIAGNOSIS — I959 Hypotension, unspecified: Secondary | ICD-10-CM | POA: Diagnosis present

## 2015-04-22 DIAGNOSIS — J9690 Respiratory failure, unspecified, unspecified whether with hypoxia or hypercapnia: Secondary | ICD-10-CM | POA: Diagnosis present

## 2015-04-22 DIAGNOSIS — R1310 Dysphagia, unspecified: Secondary | ICD-10-CM | POA: Diagnosis present

## 2015-04-22 DIAGNOSIS — J189 Pneumonia, unspecified organism: Secondary | ICD-10-CM

## 2015-04-22 DIAGNOSIS — T464X5A Adverse effect of angiotensin-converting-enzyme inhibitors, initial encounter: Secondary | ICD-10-CM | POA: Diagnosis present

## 2015-04-22 DIAGNOSIS — J850 Gangrene and necrosis of lung: Secondary | ICD-10-CM | POA: Diagnosis present

## 2015-04-22 DIAGNOSIS — Z9181 History of falling: Secondary | ICD-10-CM | POA: Diagnosis not present

## 2015-04-22 DIAGNOSIS — Z9889 Other specified postprocedural states: Secondary | ICD-10-CM | POA: Diagnosis not present

## 2015-04-22 DIAGNOSIS — Z833 Family history of diabetes mellitus: Secondary | ICD-10-CM | POA: Diagnosis not present

## 2015-04-22 DIAGNOSIS — H409 Unspecified glaucoma: Secondary | ICD-10-CM | POA: Diagnosis present

## 2015-04-22 DIAGNOSIS — Z803 Family history of malignant neoplasm of breast: Secondary | ICD-10-CM | POA: Diagnosis not present

## 2015-04-22 DIAGNOSIS — T783XXD Angioneurotic edema, subsequent encounter: Secondary | ICD-10-CM | POA: Diagnosis not present

## 2015-04-22 DIAGNOSIS — Z8249 Family history of ischemic heart disease and other diseases of the circulatory system: Secondary | ICD-10-CM | POA: Diagnosis not present

## 2015-04-22 DIAGNOSIS — K5790 Diverticulosis of intestine, part unspecified, without perforation or abscess without bleeding: Secondary | ICD-10-CM

## 2015-04-22 DIAGNOSIS — N321 Vesicointestinal fistula: Secondary | ICD-10-CM | POA: Diagnosis present

## 2015-04-22 DIAGNOSIS — J9600 Acute respiratory failure, unspecified whether with hypoxia or hypercapnia: Secondary | ICD-10-CM | POA: Diagnosis not present

## 2015-04-22 DIAGNOSIS — K921 Melena: Secondary | ICD-10-CM

## 2015-04-22 DIAGNOSIS — K5792 Diverticulitis of intestine, part unspecified, without perforation or abscess without bleeding: Secondary | ICD-10-CM | POA: Diagnosis present

## 2015-04-22 DIAGNOSIS — K219 Gastro-esophageal reflux disease without esophagitis: Secondary | ICD-10-CM | POA: Diagnosis present

## 2015-04-22 DIAGNOSIS — K05219 Aggressive periodontitis, localized, unspecified severity: Secondary | ICD-10-CM

## 2015-04-22 DIAGNOSIS — J851 Abscess of lung with pneumonia: Secondary | ICD-10-CM

## 2015-04-22 DIAGNOSIS — F17210 Nicotine dependence, cigarettes, uncomplicated: Secondary | ICD-10-CM | POA: Diagnosis present

## 2015-04-22 DIAGNOSIS — T783XXA Angioneurotic edema, initial encounter: Secondary | ICD-10-CM | POA: Diagnosis present

## 2015-04-22 DIAGNOSIS — G629 Polyneuropathy, unspecified: Secondary | ICD-10-CM | POA: Diagnosis present

## 2015-04-22 DIAGNOSIS — Z7951 Long term (current) use of inhaled steroids: Secondary | ICD-10-CM

## 2015-04-22 DIAGNOSIS — J852 Abscess of lung without pneumonia: Secondary | ICD-10-CM | POA: Diagnosis not present

## 2015-04-22 HISTORY — DX: Gastro-esophageal reflux disease without esophagitis: K21.9

## 2015-04-22 HISTORY — PX: INTUBATION-ENDOTRACHEAL WITH TRACHEOSTOMY STANDBY: SHX6592

## 2015-04-22 LAB — GLUCOSE, CAPILLARY
GLUCOSE-CAPILLARY: 86 mg/dL (ref 65–99)
GLUCOSE-CAPILLARY: 93 mg/dL (ref 65–99)

## 2015-04-22 LAB — COMPREHENSIVE METABOLIC PANEL
ALT: 11 U/L — AB (ref 17–63)
ANION GAP: 16 — AB (ref 5–15)
AST: 17 U/L (ref 15–41)
Albumin: 2.8 g/dL — ABNORMAL LOW (ref 3.5–5.0)
Alkaline Phosphatase: 69 U/L (ref 38–126)
BUN: 15 mg/dL (ref 6–20)
CHLORIDE: 103 mmol/L (ref 101–111)
CO2: 19 mmol/L — AB (ref 22–32)
Calcium: 8.7 mg/dL — ABNORMAL LOW (ref 8.9–10.3)
Creatinine, Ser: 1.16 mg/dL (ref 0.61–1.24)
GFR calc non Af Amer: >60 mL/min (ref >60)
GLUCOSE: 79 mg/dL (ref 65–99)
Potassium: 4.5 mmol/L (ref 3.5–5.1)
Sodium: 138 mmol/L (ref 135–145)
Total Bilirubin: 0.5 mg/dL (ref 0.3–1.2)
Total Protein: 6.7 g/dL (ref 6.5–8.1)

## 2015-04-22 LAB — URINE DRUG SCREEN, QUALITATIVE (ARMC ONLY)
Amphetamines, Ur Screen: POSITIVE — AB
BARBITURATES, UR SCREEN: NOT DETECTED
BENZODIAZEPINE, UR SCRN: POSITIVE — AB
CANNABINOID 50 NG, UR ~~LOC~~: POSITIVE — AB
COCAINE METABOLITE, UR ~~LOC~~: POSITIVE — AB
MDMA (Ecstasy)Ur Screen: NOT DETECTED
METHADONE SCREEN, URINE: NOT DETECTED
OPIATE, UR SCREEN: POSITIVE — AB
PHENCYCLIDINE (PCP) UR S: NOT DETECTED
Tricyclic, Ur Screen: POSITIVE — AB

## 2015-04-22 LAB — URINALYSIS COMPLETE WITH MICROSCOPIC (ARMC ONLY)
BILIRUBIN URINE: NEGATIVE
GLUCOSE, UA: NEGATIVE mg/dL
HGB URINE DIPSTICK: NEGATIVE
NITRITE: NEGATIVE
PH: 5 (ref 5.0–8.0)
Protein, ur: 30 mg/dL — AB
SPECIFIC GRAVITY, URINE: 1.021 (ref 1.005–1.030)
SQUAMOUS EPITHELIAL / LPF: NONE SEEN

## 2015-04-22 LAB — CBC WITH DIFFERENTIAL/PLATELET
BASOS PCT: 0 %
Basophils Absolute: 0 10*3/uL (ref 0–0.1)
EOS ABS: 0 10*3/uL (ref 0–0.7)
EOS PCT: 0 %
HCT: 36.8 % — ABNORMAL LOW (ref 40.0–52.0)
Hemoglobin: 11.9 g/dL — ABNORMAL LOW (ref 13.0–18.0)
LYMPHS ABS: 0.4 10*3/uL — AB (ref 1.0–3.6)
Lymphocytes Relative: 3 %
MCH: 30.8 pg (ref 26.0–34.0)
MCHC: 32.3 g/dL (ref 32.0–36.0)
MCV: 95.3 fL (ref 80.0–100.0)
Monocytes Absolute: 0.1 10*3/uL — ABNORMAL LOW (ref 0.2–1.0)
Monocytes Relative: 1 %
NEUTROS PCT: 96 %
Neutro Abs: 11.3 10*3/uL — ABNORMAL HIGH (ref 1.4–6.5)
PLATELETS: 348 10*3/uL (ref 150–440)
RBC: 3.86 MIL/uL — AB (ref 4.40–5.90)
RDW: 15.4 % — ABNORMAL HIGH (ref 11.5–14.5)
WBC: 11.7 10*3/uL — AB (ref 3.8–10.6)

## 2015-04-22 LAB — TRIGLYCERIDES: TRIGLYCERIDES: 73 mg/dL (ref <150)

## 2015-04-22 LAB — MRSA PCR SCREENING: MRSA by PCR: POSITIVE — AB

## 2015-04-22 SURGERY — INTUBATION-ENDOTRACHEAL WITH TRACHEOSTOMY STANDBY
Anesthesia: General | Wound class: Clean

## 2015-04-22 MED ORDER — TIOTROPIUM BROMIDE MONOHYDRATE 18 MCG IN CAPS
18.0000 ug | ORAL_CAPSULE | Freq: Every day | RESPIRATORY_TRACT | Status: DC
  Filled 2015-04-22: qty 5

## 2015-04-22 MED ORDER — CHLORHEXIDINE GLUCONATE CLOTH 2 % EX PADS
6.0000 | MEDICATED_PAD | Freq: Every day | CUTANEOUS | Status: DC
  Administered 2015-04-22 – 2015-04-25 (×4): 6 via TOPICAL

## 2015-04-22 MED ORDER — PROPOFOL 1000 MG/100ML IV EMUL
5.0000 ug/kg/min | INTRAVENOUS | Status: DC
  Administered 2015-04-22: 55 ug/kg/min via INTRAVENOUS
  Administered 2015-04-22: 60 ug/kg/min via INTRAVENOUS
  Administered 2015-04-22 (×2): 55 ug/kg/min via INTRAVENOUS
  Administered 2015-04-22: 30 ug/kg/min via INTRAVENOUS
  Administered 2015-04-22: 45 ug/kg/min via INTRAVENOUS
  Administered 2015-04-23: 70 ug/kg/min via INTRAVENOUS
  Administered 2015-04-23 (×2): 45 ug/kg/min via INTRAVENOUS
  Administered 2015-04-23: 60 ug/kg/min via INTRAVENOUS
  Administered 2015-04-23: 45 ug/kg/min via INTRAVENOUS
  Filled 2015-04-22 (×10): qty 100

## 2015-04-22 MED ORDER — TRAZODONE HCL 100 MG PO TABS
100.0000 mg | ORAL_TABLET | Freq: Three times a day (TID) | ORAL | Status: DC
  Administered 2015-04-22 – 2015-04-25 (×9): 100 mg via ORAL
  Filled 2015-04-22 (×9): qty 1

## 2015-04-22 MED ORDER — LATANOPROST 0.005 % OP SOLN
1.0000 [drp] | Freq: Every day | OPHTHALMIC | Status: DC
  Administered 2015-04-22 – 2015-04-24 (×3): 1 [drp] via OPHTHALMIC
  Filled 2015-04-22 (×2): qty 2.5

## 2015-04-22 MED ORDER — FAMOTIDINE IN NACL 20-0.9 MG/50ML-% IV SOLN
20.0000 mg | Freq: Two times a day (BID) | INTRAVENOUS | Status: DC
  Administered 2015-04-22 – 2015-04-24 (×5): 20 mg via INTRAVENOUS
  Filled 2015-04-22 (×7): qty 50

## 2015-04-22 MED ORDER — ANTISEPTIC ORAL RINSE SOLUTION (CORINZ)
7.0000 mL | OROMUCOSAL | Status: DC
  Administered 2015-04-22 – 2015-04-23 (×17): 7 mL via OROMUCOSAL
  Filled 2015-04-22 (×22): qty 7

## 2015-04-22 MED ORDER — CHLORHEXIDINE GLUCONATE 0.12% ORAL RINSE (MEDLINE KIT)
15.0000 mL | Freq: Two times a day (BID) | OROMUCOSAL | Status: DC
  Administered 2015-04-22 – 2015-04-23 (×3): 15 mL via OROMUCOSAL
  Filled 2015-04-22 (×5): qty 15

## 2015-04-22 MED ORDER — IOHEXOL 240 MG/ML SOLN
25.0000 mL | INTRAMUSCULAR | Status: AC
  Administered 2015-04-23 (×2): 50 mL via ORAL

## 2015-04-22 MED ORDER — MOMETASONE FURO-FORMOTEROL FUM 100-5 MCG/ACT IN AERO
2.0000 | INHALATION_SPRAY | Freq: Two times a day (BID) | RESPIRATORY_TRACT | Status: DC
Start: 2015-04-22 — End: 2015-04-22
  Filled 2015-04-22: qty 8.8

## 2015-04-22 MED ORDER — SODIUM CHLORIDE 0.9 % IV SOLN
INTRAVENOUS | Status: DC
  Administered 2015-04-22 – 2015-04-24 (×5): via INTRAVENOUS

## 2015-04-22 MED ORDER — ROCURONIUM BROMIDE 100 MG/10ML IV SOLN
INTRAVENOUS | Status: DC | PRN
  Administered 2015-04-22: 25 mg via INTRAVENOUS

## 2015-04-22 MED ORDER — SODIUM CHLORIDE 0.9 % IJ SOLN
3.0000 mL | Freq: Two times a day (BID) | INTRAMUSCULAR | Status: DC
  Administered 2015-04-22 – 2015-04-25 (×5): 3 mL via INTRAVENOUS

## 2015-04-22 MED ORDER — PANTOPRAZOLE SODIUM 40 MG PO TBEC: 40.0000 mg | DELAYED_RELEASE_TABLET | Freq: Every day | ORAL | Status: DC

## 2015-04-22 MED ORDER — OXYCODONE HCL 5 MG PO TABS: 5.0000 mg | ORAL_TABLET | ORAL | Status: DC | PRN

## 2015-04-22 MED ORDER — SUCCINYLCHOLINE CHLORIDE 20 MG/ML IJ SOLN
INTRAMUSCULAR | Status: DC | PRN
  Administered 2015-04-22: 50 mg via INTRAVENOUS

## 2015-04-22 MED ORDER — FENTANYL CITRATE (PF) 100 MCG/2ML IJ SOLN
25.0000 ug | INTRAMUSCULAR | Status: DC | PRN
  Administered 2015-04-23: 25 ug via INTRAVENOUS
  Filled 2015-04-22: qty 2

## 2015-04-22 MED ORDER — IPRATROPIUM-ALBUTEROL 0.5-2.5 (3) MG/3ML IN SOLN
3.0000 mL | Freq: Four times a day (QID) | RESPIRATORY_TRACT | Status: DC
  Administered 2015-04-22 – 2015-04-24 (×10): 3 mL via RESPIRATORY_TRACT
  Filled 2015-04-22 (×9): qty 3

## 2015-04-22 MED ORDER — SODIUM CHLORIDE 0.9 % IV SOLN
3.0000 g | Freq: Four times a day (QID) | INTRAVENOUS | Status: AC
  Administered 2015-04-22 (×4): 3 g via INTRAVENOUS
  Filled 2015-04-22 (×4): qty 3

## 2015-04-22 MED ORDER — ONDANSETRON HCL 4 MG/2ML IJ SOLN: 4.0000 mg | Freq: Once | INTRAMUSCULAR | Status: DC | PRN

## 2015-04-22 MED ORDER — GABAPENTIN 300 MG PO CAPS
1200.0000 mg | ORAL_CAPSULE | Freq: Two times a day (BID) | ORAL | Status: DC
Start: 2015-04-22 — End: 2015-04-25
  Administered 2015-04-22 – 2015-04-25 (×7): 1200 mg via ORAL
  Filled 2015-04-22 (×7): qty 4

## 2015-04-22 MED ORDER — FLUTICASONE PROPIONATE 50 MCG/ACT NA SUSP
1.0000 | Freq: Every day | NASAL | Status: DC
  Administered 2015-04-22: 1 via NASAL
  Filled 2015-04-22: qty 16

## 2015-04-22 MED ORDER — DEXAMETHASONE SODIUM PHOSPHATE 10 MG/ML IJ SOLN
10.0000 mg | Freq: Four times a day (QID) | INTRAMUSCULAR | Status: DC
  Administered 2015-04-22 – 2015-04-24 (×11): 10 mg via INTRAVENOUS
  Filled 2015-04-22 (×12): qty 1

## 2015-04-22 MED ORDER — QUETIAPINE FUMARATE ER 200 MG PO TB24
200.0000 mg | ORAL_TABLET | Freq: Two times a day (BID) | ORAL | Status: DC
  Administered 2015-04-23 – 2015-04-25 (×5): 200 mg via ORAL
  Filled 2015-04-22 (×8): qty 1

## 2015-04-22 MED ORDER — IOHEXOL 300 MG/ML  SOLN
75.0000 mL | Freq: Once | INTRAMUSCULAR | Status: AC | PRN
  Administered 2015-04-22: 75 mL via INTRAVENOUS

## 2015-04-22 MED ORDER — EPINEPHRINE HCL 0.1 MG/ML IJ SOSY
PREFILLED_SYRINGE | INTRAMUSCULAR | Status: DC | PRN
  Administered 2015-04-22: 10 ug via INTRAVENOUS

## 2015-04-22 MED ORDER — MORPHINE SULFATE (PF) 2 MG/ML IV SOLN
2.0000 mg | INTRAVENOUS | Status: DC | PRN
  Administered 2015-04-23 (×2): 2 mg via INTRAVENOUS
  Filled 2015-04-22 (×2): qty 1

## 2015-04-22 MED ORDER — MUPIROCIN 2 % EX OINT
1.0000 "application " | TOPICAL_OINTMENT | Freq: Two times a day (BID) | CUTANEOUS | Status: DC
  Administered 2015-04-22 – 2015-04-25 (×6): 1 via NASAL
  Filled 2015-04-22 (×3): qty 22

## 2015-04-22 MED ORDER — LORATADINE 10 MG PO TABS
10.0000 mg | ORAL_TABLET | Freq: Every day | ORAL | Status: DC
  Administered 2015-04-22 – 2015-04-25 (×4): 10 mg via ORAL
  Filled 2015-04-22 (×4): qty 1

## 2015-04-22 MED ORDER — ONDANSETRON HCL 4 MG/2ML IJ SOLN: 4.0000 mg | Freq: Four times a day (QID) | INTRAMUSCULAR | Status: DC | PRN

## 2015-04-22 MED ORDER — BUDESONIDE 0.5 MG/2ML IN SUSP
0.5000 mg | Freq: Two times a day (BID) | RESPIRATORY_TRACT | Status: DC
  Administered 2015-04-22 – 2015-04-25 (×6): 0.5 mg via RESPIRATORY_TRACT
  Filled 2015-04-22 (×6): qty 2

## 2015-04-22 MED ORDER — ACETAMINOPHEN 650 MG RE SUPP: 650.0000 mg | Freq: Four times a day (QID) | RECTAL | Status: DC | PRN

## 2015-04-22 MED ORDER — MIDAZOLAM HCL 2 MG/2ML IJ SOLN
INTRAMUSCULAR | Status: DC | PRN
  Administered 2015-04-22: 2 mg via INTRAVENOUS

## 2015-04-22 MED ORDER — BUTAMBEN-TETRACAINE-BENZOCAINE 2-2-14 % EX AERO
INHALATION_SPRAY | CUTANEOUS | Status: AC
  Filled 2015-04-22: qty 20

## 2015-04-22 MED ORDER — FENTANYL CITRATE (PF) 100 MCG/2ML IJ SOLN
INTRAMUSCULAR | Status: DC | PRN
  Administered 2015-04-22: 250 ug via INTRAVENOUS

## 2015-04-22 MED ORDER — PANTOPRAZOLE SODIUM 40 MG PO TBEC
40.0000 mg | DELAYED_RELEASE_TABLET | Freq: Every day | ORAL | Status: DC
  Filled 2015-04-22: qty 1

## 2015-04-22 MED ORDER — HEPARIN SODIUM (PORCINE) 5000 UNIT/ML IJ SOLN
5000.0000 [IU] | Freq: Three times a day (TID) | INTRAMUSCULAR | Status: DC
  Administered 2015-04-22 – 2015-04-25 (×9): 5000 [IU] via SUBCUTANEOUS
  Filled 2015-04-22 (×9): qty 1

## 2015-04-22 MED ORDER — ONDANSETRON HCL 4 MG PO TABS: 4.0000 mg | ORAL_TABLET | Freq: Four times a day (QID) | ORAL | Status: DC | PRN

## 2015-04-22 MED ORDER — ACETAMINOPHEN 325 MG PO TABS
650.0000 mg | ORAL_TABLET | Freq: Four times a day (QID) | ORAL | Status: DC | PRN
  Administered 2015-04-22: 650 mg via ORAL
  Filled 2015-04-22: qty 2

## 2015-04-22 MED ORDER — EPHEDRINE SULFATE 50 MG/ML IJ SOLN
INTRAMUSCULAR | Status: DC | PRN
  Administered 2015-04-22: 10 mg via INTRAVENOUS
  Administered 2015-04-22: 20 mg via INTRAVENOUS

## 2015-04-22 MED ORDER — PROPOFOL 10 MG/ML IV BOLUS
INTRAVENOUS | Status: DC | PRN
  Administered 2015-04-22: 180 mg via INTRAVENOUS

## 2015-04-22 SURGICAL SUPPLY — 11 items
BLADE SURG 15 STRL LF DISP TIS (BLADE) ×1 IMPLANT
BLADE SURG 15 STRL SS (BLADE) ×2
BLADE SURG SZ11 CARB STEEL (BLADE) ×3 IMPLANT
CANISTER SUCT 1200ML W/VALVE (MISCELLANEOUS) ×3 IMPLANT
GLOVE BIO SURGEON STRL SZ7.5 (GLOVE) ×3 IMPLANT
GOWN STRL REUS W/ TWL LRG LVL3 (GOWN DISPOSABLE) ×2 IMPLANT
GOWN STRL REUS W/TWL LRG LVL3 (GOWN DISPOSABLE) ×4
KIT RM TURNOVER STRD PROC AR (KITS) ×3 IMPLANT
PACK HEAD/NECK (MISCELLANEOUS) ×3 IMPLANT
PAD GROUND ADULT SPLIT (MISCELLANEOUS) ×3 IMPLANT
SPONGE DRAIN TRACH 4X4 STRL 2S (GAUZE/BANDAGES/DRESSINGS) ×3 IMPLANT

## 2015-04-22 NOTE — Op Note (Signed)
04/22/2015  1:53 AM    Fischl, Jillyn HiddenGary  [id number]   Pre-Op Dx: angioedema  Post-op Dx: SAME  Proc: Intubation in the operating room emergently   Surg:  Samaira Holzworth T  Anes:  GOT  EBL:  0  Comp:  None  Findings:  Significant edema of the epiglottis and arytenoids bilaterally  Procedure: Prescription is brought from the emergency room emergently to the operating room and transferred to the operating room table in supine position. With Dr. Yves DillPaul Carroll in attendance the patient was gently breathed down propofol was administered with the patient being able to be bag without difficulty succinylcholine was then given direct vision with the laryngoscope showed edema of the epiglottis and the arytenoids. A bougie was then placed through the vocal folds a #7 endotracheal tube was advanced over the bougie into the airway. Her excellent breath sounds laterally. The endotracheal tube was secured secured approximately 23 cm. Patient will be transferred to the intensive care unit for monitoring.  Dispo:   Good  Plan:  Transfer to the intensive care unit for overnight observation would recommend at least 48 hours of IV antibiotics and steroids.  Sukhman Martine T  04/22/2015 1:53 AM

## 2015-04-22 NOTE — Progress Notes (Signed)
04/22/2015 2:01 AM  Steinruck, Jillyn HiddenGary [id number]  Post-Op Day 0    Temp:  [97.6 F (36.4 C)] 97.6 F (36.4 C) (10/23 0013) Pulse Rate:  [99-112] 99 (10/23 0130) Resp:  [18] 18 (10/23 0013) BP: (108-139)/(77-102) 110/78 mmHg (10/23 0130) SpO2:  [95 %-100 %] 100 % (10/23 0130) FiO2 (%):  [21 %] 21 % (10/23 0021) Weight:  [74.844 kg (165 lb)] 74.844 kg (165 lb) (10/23 0013),    No intake or output data in the 24 hours ending 04/22/15 0201  No results found for this or any previous visit (from the past 24 hour(s)).  SUBJECTIVE:  Patient is intubated sedated and is on transport to the ICU  OBJECTIVE:  Stable airway  IMPRESSION:  Angioedema question periapical tooth abscess  PLAN:  He will be admitted to the ICU and followed by the hospitalist program as well as the intensive care unit physicians. Would recommend a neck CT with contrast tomorrow. Would also recommend Unasyn 3 g IV every 6 hours and Decadron 10 mg IV every 8 hours for angioedema. Would recommend 48 hours of intubation antibiotic and steroid therapy until this angioedema resolves.  Laksh Hinners T 04/22/2015, 2:01 AM

## 2015-04-22 NOTE — Progress Notes (Signed)
[facility] [place] [facility]   PATIENT NAME: Perry BarbaraGary Bullock    MR#:  [id number]  DATE OF BIRTH:  December 31, 1960  SUBJECTIVE:  CHIEF COMPLAINT:   Chief Complaint  Patient presents with  . Allergic Reaction   Sedated, calm.  REVIEW OF SYSTEMS:   ROS unable to obtain, patient sedated  DRUG ALLERGIES:  No Known Allergies  VITALS:  Blood pressure 103/75, pulse 80, temperature 97.4 F (36.3 C), temperature source Oral, resp. rate 18, height 6' (1.829 m), weight 71.6 kg (157 lb 13.6 oz), SpO2 97 %.  PHYSICAL EXAMINATION:  GENERAL:  54 y.o.-year-old patient lying in the bed with no acute distress, sedated EYES: Pupils equal, round, reactive to light and accommodation. Conjunctiva clear.  HEENT: Head atraumatic, normocephalic. Mouth closed around ETT, does not seem very swollen, MM seem moist NECK:  Supple, no jugular venous distention. No thyroid enlargement, no tenderness. No JVD or swelling.  LUNGS: on full ventilator support with Normal breath sounds bilaterally, no wheezing, rales, rhonchi or crepitation. No use of accessory muscles of respiration. Good air movement. Minimal secretions. CARDIOVASCULAR: S1, S2 normal. No murmurs, rubs, or gallops.  ABDOMEN: Soft, nontender, nondistended. Bowel sounds present. No organomegaly or mass.  EXTREMITIES: No pedal edema, cyanosis, or clubbing. Pulses 2+ NEUROLOGIC: sedated for ventilation PSYCHIATRIC: sedated for ventilation SKIN: No obvious rash, lesion, or ulcer.    LABORATORY PANEL:   CBC  Recent Labs Lab 04/22/15 0527  WBC 11.7*  HGB 11.9*  HCT 36.8*  PLT 348   ------------------------------------------------------------------------------------------------------------------  Chemistries   Recent Labs Lab 04/22/15 0527  NA 138  K 4.5  CL 103  CO2 19*  GLUCOSE 79  BUN 15  CREATININE 1.16  CALCIUM 8.7*  AST 17  ALT 11*  ALKPHOS 69  BILITOT 0.5    ------------------------------------------------------------------------------------------------------------------  Cardiac Enzymes No results for input(s): TROPONINI in the last 168 hours. ------------------------------------------------------------------------------------------------------------------  RADIOLOGY:  Ct Head Wo Contrast  04/22/2015  CLINICAL DATA:  Respiratory failure, angioedema, intubation EXAM: CT HEAD WITHOUT CONTRAST TECHNIQUE: Contiguous axial images were obtained from the base of the skull through the vertex without intravenous contrast. COMPARISON:  None. FINDINGS: Generalized atrophy. Normal ventricular morphology. No midline shift or mass effect. Normal appearance of brain parenchyma. No intracranial hemorrhage, mass lesion or evidence acute infarction. No extra-axial fluid collections. Deviation of nose and nasal septum to the LEFT. Visualized paranasal sinuses and mastoid air cells clear. No acute osseous findings. IMPRESSION: Generalized atrophy. No acute intracranial abnormalities. Electronically Signed   By: Ulyses SouthwardMark  Boles M.D.   On: 04/22/2015 11:03   Ct Chest W Contrast  04/22/2015  CLINICAL DATA:  Allergic reaction, on antibiotics for right upper lobe cavitary lesion/necrotizing pneumonia EXAM: CT CHEST WITH CONTRAST TECHNIQUE: Multidetector CT imaging of the chest was performed during intravenous contrast administration. CONTRAST:  75mL OMNIPAQUE IOHEXOL 300 MG/ML  SOLN COMPARISON:  CT chest dated 04/03/2015 FINDINGS: Mediastinum/Nodes: The heart is normal in size. No pericardial effusion. Mild atherosclerotic calcifications of the aortic arch. Small mediastinal lymph nodes, including an 11 mm short axis high right paratracheal node near the thoracic inlet (series 2/ image 9). Visualized thyroid is unremarkable. Lungs/Pleura: Endotracheal tube terminates 4 cm above the carina. Improving thick-walled cavitary lesion in the right lung apex now measuring approximately  7.3 x 4.9 cm, previously 10.8 x 7.8 cm. Additional satellite nodularity in the right middle lobe and right lower lobe (series 3/ image 31), also improving. Given the interval change following antibiotic therapy,  these findings are compatible with improving pneumonia. Small left pleural effusion, some of which is chronic with calcifications. Trace right pleural effusion. Underlying mild emphysematous changes with scattered areas of scarring. No pneumothorax. Upper abdomen: Visualized upper abdomen is notable for hepatic steatosis and an enteric tube that terminates in the proximal gastric body. Musculoskeletal: Degenerative changes of the visualized thoracolumbar spine. Mild superior endplate compression fracture deformity at T11, unchanged. IMPRESSION: Improving thick-walled cavitary lesion in the right lung apex, now measuring approximate 7.3 x 4.9 cm. Improving satellite nodularity in the right middle and lower lobes. Overall appearance is compatible with improving multifocal pneumonia. Electronically Signed   By: Charline Bills M.D.   On: 04/22/2015 11:17   Ct Maxillofacial W/cm  04/22/2015  CLINICAL DATA:  Intubation, respiratory failure, angioedema with swelling of the lips, tongue, mouth and epiglottis EXAM: CT MAXILLOFACIAL WITH CONTRAST TECHNIQUE: Multidetector CT imaging of the maxillofacial structures was performed with intravenous contrast. Multiplanar CT image reconstructions were also generated. A small metallic BB was placed on the right temple in order to reliably differentiate right from left. CONTRAST:  75mL OMNIPAQUE IOHEXOL 300 MG/ML  SOLN IV COMPARISON:  None FINDINGS: Generalized cerebral atrophy. Visualized intracranial structures otherwise unremarkable. Orbital soft tissue planes clear. Deviation of nasal septum and nasal bones to the LEFT. Endotracheal tube enters larynx. Nasogastric tube enters proximal esophagus. Symmetric appearance of parotid and submandibular glands bilaterally.  Diffuse soft tissue edema identified at the parapharyngeal and laryngeal soft tissues as well as adjacent to the tongue muscles. No discrete loculated fluid collection is identified. Small amount of fluid within the oral cavity and dependently within nasopharynx. Prevertebral soft tissues normal thickness. Scattered subcutaneous edema as well as edema with at the lips and oral cavity. Vascular structures grossly patent. No definite mass or adenopathy. Paranasal sinuses, mastoid air cells and middle ear cavities clear. Bones demineralized without acute fracture. Probable fracture involving medial wall of the LEFT orbit. Visualized portion of cervical spine significant for degenerative disc and facet disease changes. Numerous dental caries without significant periodontal disease. IMPRESSION: Scattered soft tissue edema diffusely in the parapharyngeal space, tongue, perilingual soft tissues, oral soft tissues, lips, as well as at the soft palate consistent with history of angioedema. No definite prevertebral fluid collection identified. No loculated fluid collection, adenopathy or mass identified. Electronically Signed   By: Ulyses Southward M.D.   On: 04/22/2015 11:15    EKG:   Orders placed or performed during the hospital encounter of 04/03/15  . EKG 12-Lead  . EKG 12-Lead  . ED EKG  . ED EKG  . EKG    ASSESSMENT AND PLAN:   1) angioedema requiring intubation and ventilation: - etiology unclear. CT with no abscess. Had been on augmentin for 2 weeks. Per HPI he did smoke cocaine on the night prior to admission and may have had an unknown exposure at that time - Pulmonology following - seems less swollen externally - continue dexamethasone, pepcid, claritin - continue unasyn - ID consult pending for tomorrow  2) RUL necrotizing pneumonia - improving on CT, no fever, mild leucocytosis - continue unasyn - blood cultures NTD - ECHO pending  3) COPD: no exacerbation - continue duo nebs, Spiriva,  dulera, decadron  4) ETOH abuse - no signs of withdrawal  5) hypotension - likely due to sedation/ventilation - continue fluid support, goal MAP >65, +300 ml at this point  CODE STATUS: full  TOTAL TIME TAKING CARE OF THIS PATIENT: 30 minutes.  Greater than  50% of time spent in care coordination and counseling. Care plan discussed with nurse. Dr. Dema Severin has updated family this morning. POSSIBLE D/C IN 2-3 DAYS, DEPENDING ON CLINICAL CONDITION.   Elby Showers M.D on 04/22/2015 at 3:13 PM  Between 7am to 6pm - Pager - [phone]  After 6pm go to [email or web address] - password [email or web address] [facility]  [facility]  Office  [phone]  CC: Primary care physician; [facility]

## 2015-04-22 NOTE — Anesthesia Postprocedure Evaluation (Signed)
  Anesthesia Post-op Note  Patient: Perry Bullock  Procedure(s) Performed: Procedure(s): INTUBATION-ENDOTRACHEAL WITH TRACHEOSTOMY STANDBY (N/A)  Anesthesia type:General  Patient location: PACU  Post pain: Pain level controlled  Post assessment: Post-op Vital signs reviewed, Patient's Cardiovascular Status Stable, Respiratory Function Stable, Patient sedated and intubated  Post vital signs: Reviewed and stable  Last Vitals:  Filed Vitals:   04/22/15 1900  BP: 85/68  Pulse: 69  Temp:   Resp: 16    Level of consciousness: sedated and intubated  Complications: No apparent anesthesia complications.  Patient has been kept sedated and intubated to let angioedema to resolve

## 2015-04-22 NOTE — ED Notes (Signed)
ENT in with patient. Recommends going to OR for intubation due to increased swelling in throat. He will call OR.

## 2015-04-22 NOTE — Progress Notes (Signed)
*  PRELIMINARY RESULTS* Echocardiogram 2D Echocardiogram has been performed.  Perry Bullock 04/22/2015, 12:37 PM

## 2015-04-22 NOTE — Progress Notes (Signed)
04/22/2015 3:43 PM  Perry Bullock, Perry Bullock [id number]  Post-Op Day 1    Temp:  [97.4 F (36.3 C)-97.6 F (36.4 C)] 97.4 F (36.3 C) (10/23 1300) Pulse Rate:  [76-113] 77 (10/23 1500) Resp:  [16-30] 22 (10/23 1500) BP: (88-139)/(65-104) 88/66 mmHg (10/23 1500) SpO2:  [95 %-100 %] 98 % (10/23 1500) FiO2 (%):  [21 %-40 %] 24 % (10/23 1354) Weight:  [71.6 kg (157 lb 13.6 oz)-74.844 kg (165 lb)] 71.6 kg (157 lb 13.6 oz) (10/23 0300),     Intake/Output Summary (Last 24 hours) at 04/22/15 1543 Last data filed at 04/22/15 1338  Gross per 24 hour  Intake 1061.67 ml  Output    900 ml  Net 161.67 ml    Results for orders placed or performed during the hospital encounter of 04/22/15 (from the past 24 hour(s))  MRSA PCR Screening     Status: Abnormal   Collection Time: 04/22/15  3:00 AM  Result Value Ref Range   MRSA by PCR POSITIVE (A) NEGATIVE  Draw ABG 1 hour after initiation of ventilator     Status: Abnormal (Preliminary result)   Collection Time: 04/22/15  4:00 AM  Result Value Ref Range   FIO2 0.40    Delivery systems PENDING    Mode PRESSURE REGULATED VOLUME CONTROL    VT 500 mL   LHR 14 resp/min   Peep/cpap 5.0 cm H20   Inspiratory PAP PENDING    Expiratory PAP PENDING    pH, Arterial 7.26 (L) 7.350 - 7.450   pCO2 arterial 38 32.0 - 48.0 mmHg   pO2, Arterial 178 (H) 83.0 - 108.0 mmHg   Bicarbonate 17.1 (L) 21.0 - 28.0 mEq/L   Acid-base deficit 9.3 (H) 0.0 - 2.0 mmol/L   O2 Saturation 99.4 %   Patient temperature 37.0    Oxygen index PENDING    Collection site RIGHT RADIAL    Sample type ARTERIAL DRAW    Allens test (pass/fail) PASS PASS   Mechanical Rate 14   CBC with Differential     Status: Abnormal   Collection Time: 04/22/15  5:27 AM  Result Value Ref Range   WBC 11.7 (H) 3.8 - 10.6 K/uL   RBC 3.86 (L) 4.40 - 5.90 MIL/uL   Hemoglobin 11.9 (L) 13.0 - 18.0 g/dL   HCT 40.9 (L) 81.1 - 91.4 %   MCV 95.3 80.0 - 100.0 fL   MCH 30.8 26.0 - 34.0 pg   MCHC 32.3 32.0 -  36.0 g/dL   RDW 78.2 (H) 95.6 - 21.3 %   Platelets 348 150 - 440 K/uL   Neutrophils Relative % 96 %   Neutro Abs 11.3 (H) 1.4 - 6.5 K/uL   Lymphocytes Relative 3 %   Lymphs Abs 0.4 (L) 1.0 - 3.6 K/uL   Monocytes Relative 1 %   Monocytes Absolute 0.1 (L) 0.2 - 1.0 K/uL   Eosinophils Relative 0 %   Eosinophils Absolute 0.0 0 - 0.7 K/uL   Basophils Relative 0 %   Basophils Absolute 0.0 0 - 0.1 K/uL  Comprehensive metabolic panel     Status: Abnormal   Collection Time: 04/22/15  5:27 AM  Result Value Ref Range   Sodium 138 135 - 145 mmol/L   Potassium 4.5 3.5 - 5.1 mmol/L   Chloride 103 101 - 111 mmol/L   CO2 19 (L) 22 - 32 mmol/L   Glucose, Bld 79 65 - 99 mg/dL   BUN 15 6 - 20 mg/dL   Creatinine,  Ser 1.16 0.61 - 1.24 mg/dL   Calcium 8.7 (L) 8.9 - 10.3 mg/dL   Total Protein 6.7 6.5 - 8.1 g/dL   Albumin 2.8 (L) 3.5 - 5.0 g/dL   AST 17 15 - 41 U/L   ALT 11 (L) 17 - 63 U/L   Alkaline Phosphatase 69 38 - 126 U/L   Total Bilirubin 0.5 0.3 - 1.2 mg/dL   GFR calc non Af Amer >60 >60 mL/min   GFR calc Af Amer >60 >60 mL/min   Anion gap 16 (H) 5 - 15  Triglycerides     Status: None   Collection Time: 04/22/15  5:27 AM  Result Value Ref Range   Triglycerides 73 <150 mg/dL  Glucose, capillary     Status: None   Collection Time: 04/22/15  8:00 AM  Result Value Ref Range   Glucose-Capillary 86 65 - 99 mg/dL  Glucose, capillary     Status: None   Collection Time: 04/22/15  8:45 AM  Result Value Ref Range   Glucose-Capillary 93 65 - 99 mg/dL  Urinalysis complete, with microscopic ([facility] only)     Status: Abnormal   Collection Time: 04/22/15 11:00 AM  Result Value Ref Range   Color, Urine YELLOW (A) YELLOW   APPearance CLEAR (A) CLEAR   Glucose, UA NEGATIVE NEGATIVE mg/dL   Bilirubin Urine NEGATIVE NEGATIVE   Ketones, ur 2+ (A) NEGATIVE mg/dL   Specific Gravity, Urine 1.021 1.005 - 1.030   Hgb urine dipstick NEGATIVE NEGATIVE   pH 5.0 5.0 - 8.0   Protein, ur 30 (A) NEGATIVE mg/dL    Nitrite NEGATIVE NEGATIVE   Leukocytes, UA TRACE (A) NEGATIVE   RBC / HPF 0-5 0 - 5 RBC/hpf   WBC, UA 0-5 0 - 5 WBC/hpf   Bacteria, UA RARE (A) NONE SEEN   Squamous Epithelial / LPF NONE SEEN NONE SEEN   Mucous PRESENT   Urine Drug Screen, Qualitative ([facility] only)     Status: Abnormal   Collection Time: 04/22/15 11:00 AM  Result Value Ref Range   Tricyclic, Ur Screen POSITIVE (A) NONE DETECTED   Amphetamines, Ur Screen POSITIVE (A) NONE DETECTED   MDMA (Ecstasy)Ur Screen NONE DETECTED NONE DETECTED   Cocaine Metabolite,Ur [place] POSITIVE (A) NONE DETECTED   Opiate, Ur Screen POSITIVE (A) NONE DETECTED   Phencyclidine (PCP) Ur S NONE DETECTED NONE DETECTED   Cannabinoid 50 Ng, Ur [place] POSITIVE (A) NONE DETECTED   Barbiturates, Ur Screen NONE DETECTED NONE DETECTED   Benzodiazepine, Ur Scrn POSITIVE (A) NONE DETECTED   Methadone Scn, Ur NONE DETECTED NONE DETECTED    SUBJECTIVE:  Intubated and sedated.    OBJECTIVE:  Tongue edema less today.  CT reviewed.  No abscess seen. There was orpharyngeal and hypopharyngeal edema.    IMPRESSION:  Agioedema with airway obstruction-would continue IV abx and steroids for 48hrs.  Tuesday AM would hold sedation and let cuff down, if large leak and pulmonary situation stable could try extubation.    PLAN:  I will sign off for now.  Please reconsult if extubation fails or there are other questions.   Latrica Clowers T 04/22/2015, 3:43 PM

## 2015-04-22 NOTE — ED Provider Notes (Addendum)
[facility] Emergency Department Provider Note  ____________________________________________  Time seen: Approximately 12:13 AM  I have reviewed the triage vital signs and the nursing notes.   HISTORY  Chief Complaint Allergic Reaction    HPI Perry Bullock is a 54 y.o. male with a recent hospitalization for a necrotizing lung abscess who presents by EMS for swelling of his tongue and throat.  He states that he was taking a nap tonight and when he awoke at about 7 PM he realized that his mouth and tongue were swollen and he was having trouble keeping his tongue and his mouth.  He was scared by the sensation, having never had such symptoms before, and he called EMS when it did not improve.  EMS provided Solu-Medrol 125 mg IV and Benadryl prior to arrival.  Upon arrival in the emergency department the patient has obvious angioedema of the tongue and the left lip but he also has swelling of the neck under his jaw that is concerning.  He is tolerating his secretions upon initial assessment and is breathing comfortably and his SPO2 is 100%.  Asian states that the symptoms were an acute onset and are severe.  Nothing has made the swelling better and it seems to be slowly but steadily getting worse.  He denies fever/chills, chest pain, shortness of breath, abdominal pain, nausea/vomiting.  He reports that it feels like it is getting harder to swallow.  He denies starting on any new medications recently other than antibiotics about 3 weeks ago during his hospitalization for lung infection.  He has not had any new antihypertensives recently.  He has a history of bipolar disorder and does take several psychiatric medications as well, but these have not changed recently.   Past Medical History  Diagnosis Date  . Bipolar 1 disorder (HCC)   . Chronic back pain   . Asthma   . COPD (chronic obstructive pulmonary disease) (HCC)   . Hepatitis     Patient Active Problem List   Diagnosis Date Noted  . Abscess of upper lobe of right lung without pneumonia (HCC) 04/03/2015  . COPD (chronic obstructive pulmonary disease) (HCC) 04/03/2015  . Hep C w/o coma, chronic (HCC) 04/03/2015  . Bipolar disorder (HCC) 04/03/2015    Past Surgical History  Procedure Laterality Date  . Eye surgery    . Mandible surgery      Current Outpatient Rx  Name  Route  Sig  Dispense  Refill  . albuterol (PROVENTIL HFA;VENTOLIN HFA) 108 (90 BASE) MCG/ACT inhaler   Inhalation   Inhale 1 puff into the lungs every 6 (six) hours as needed for wheezing or shortness of breath.         Marland Kitchen albuterol (PROVENTIL) (2.5 MG/3ML) 0.083% nebulizer solution   Nebulization   Take 3 mLs (2.5 mg total) by nebulization every 6 (six) hours as needed for wheezing or shortness of breath.   75 mL   12   . amoxicillin-clavulanate (AUGMENTIN) 875-125 MG tablet   Oral   Take 1 tablet by mouth every 12 (twelve) hours. 1 tab two times a day for 4 weeks   56 tablet   0   . cetirizine (ZYRTEC) 10 MG tablet   Oral   Take 10 mg by mouth daily.         . fluticasone (FLONASE) 50 MCG/ACT nasal spray   Each Nare   Place 1 spray into both nostrils daily.         Marland Kitchen  gabapentin (NEURONTIN) 300 MG capsule   Oral   Take 1,200 mg by mouth 2 (two) times daily.         Marland Kitchen guaiFENesin-dextromethorphan (ROBITUSSIN DM) 100-10 MG/5ML syrup   Oral   Take 5 mLs by mouth every 6 (six) hours as needed for cough.   118 mL   0   . latanoprost (XALATAN) 0.005 % ophthalmic solution   Both Eyes   Place 1 drop into both eyes at bedtime.         . mometasone-formoterol (DULERA) 100-5 MCG/ACT AERO   Inhalation   Inhale 2 puffs into the lungs 2 (two) times daily.         Marland Kitchen omeprazole (PRILOSEC) 20 MG capsule   Oral   Take 1 capsule (20 mg total) by mouth daily.   30 capsule   1   . ondansetron (ZOFRAN ODT) 4 MG disintegrating tablet   Oral   Take 1 tablet (4 mg total) by mouth every 8 (eight) hours as  needed for nausea or vomiting.   12 tablet   0   . ondansetron (ZOFRAN) 4 MG tablet   Oral   Take 1 tablet (4 mg total) by mouth every 8 (eight) hours as needed for nausea.   20 tablet   0   . oxyCODONE-acetaminophen (PERCOCET/ROXICET) 5-325 MG tablet   Oral   Take 1 tablet by mouth every 6 (six) hours as needed for moderate pain.   20 tablet   0   . pantoprazole (PROTONIX) 40 MG tablet   Oral   Take 40 mg by mouth daily.         . QUEtiapine (SEROQUEL XR) 200 MG 24 hr tablet   Oral   Take 200 mg by mouth 2 (two) times daily.         Marland Kitchen tiotropium (SPIRIVA) 18 MCG inhalation capsule   Inhalation   Place 18 mcg into inhaler and inhale daily.         . traZODone (DESYREL) 100 MG tablet   Oral   Take 100 mg by mouth 3 (three) times daily.         Marland Kitchen zolpidem (AMBIEN) 10 MG tablet   Oral   Take 10 mg by mouth at bedtime as needed for sleep.           Allergies Review of patient's allergies indicates no known allergies.  Family History  Problem Relation Age of Onset  . Breast cancer Mother   . Hypertension Father   . Diabetes Father     Social History Social History  Substance Use Topics  . Smoking status: Former Games developer  . Smokeless tobacco: Not on file  . Alcohol Use: No    Review of Systems Constitutional: No fever/chills Eyes: No visual changes. ENT: No sore throat.  Significant swelling to the lower lip primarily on the left side, the tongue, and presumably the throat Cardiovascular: Denies chest pain. Respiratory: Denies shortness of breath. Gastrointestinal: No abdominal pain.  No nausea, no vomiting.  No diarrhea.  No constipation. Genitourinary: Negative for dysuria. Musculoskeletal: Negative for back pain. Skin: Negative for rash. Neurological: Negative for headaches, focal weakness or numbness.  10-point ROS otherwise negative.  ____________________________________________   PHYSICAL EXAM:  ED Triage Vitals  Enc Vitals Group      BP 04/22/15 0013 139/102 mmHg     Pulse Rate 04/22/15 0013 112     Resp 04/22/15 0013 18     Temp 04/22/15 0013 97.6  F (36.4 C)     Temp Source 04/22/15 0013 Axillary     SpO2 04/22/15 0013 95 %     Weight 04/22/15 0013 165 lb (74.844 kg)     Height 04/22/15 0013 6' (1.829 m)     Head Cir --      Peak Flow --      Pain Score 04/22/15 0014 7     Pain Loc --      Pain Edu? --      Excl. in GC? --       Constitutional: Alert and oriented but with obvious angioedema concerning for impending airway collapse Eyes: Conjunctivae are normal. PERRL. EOMI. Head: Atraumatic. Nose: No congestion/rhinnorhea. Mouth/Throat: Mucous membranes are moist.  Angioedema is notable in the left lower lip, tongue, and he has what I would describe as brawny induration bilateral submandibular and throat.  His uvula was slightly visible upon my initial assessment with a tongue blade, but when I reassessed I could not see the uvula anymore.  The patient has poor dental hygiene but he has no obvious abscesses/dental sources of infection.  My exam is non-tender and he expresses no pain/tenderness to palpation below the tongue or around his gums/teeth. Neck: No stridor.  Tolerated secretions currently.  I marked the cricoid membrane with a skin marker. Cardiovascular: Normal rate, regular rhythm. Grossly normal heart sounds.  Good peripheral circulation. Respiratory: Normal respiratory effort.  No retractions. Lungs CTAB. Gastrointestinal: Soft and nontender. No distention. No abdominal bruits. No CVA tenderness. Musculoskeletal: No lower extremity tenderness nor edema.  No joint effusions. Neurologic:  Normal speech and language. No gross focal neurologic deficits are appreciated.  Skin:  Skin is warm, dry and intact. No rash noted.   ____________________________________________   LABS (all labs ordered are listed, but only abnormal results are displayed)  pending  _______________________  EKG  Not  indicated ____________________________________________  RADIOLOGY   No results found.  ____________________________________________   PROCEDURES  Procedure(s) performed: None  Critical Care performed: Yes, see critical care note(s)   CRITICAL CARE Performed by: Loleta RoseFORBACH, Carmel Garfield   Total critical care time: 30 minutes  Critical care time was exclusive of separately billable procedures and treating other patients.  Critical care was necessary to treat or prevent imminent or life-threatening deterioration.  Critical care was time spent personally by me on the following activities: development of treatment plan with patient and/or surrogate as well as nursing, discussions with consultants, evaluation of patient's response to treatment, examination of patient, obtaining history from patient or surrogate, ordering and performing treatments and interventions, ordering and review of laboratory studies, ordering and review of radiographic studies, pulse oximetry and re-evaluation of patient's condition.  ____________________________________________   INITIAL IMPRESSION / ASSESSMENT AND PLAN / ED COURSE  Pertinent labs & imaging results that were available during my care of the patient were reviewed by me and considered in my medical decision making (see chart for details).  12:24 AM After evaluation of the patient I have marked his cricoid membrane externally and put the difficult airway cart outside his room as well as the Glidescope.  We have paged Dr. Jenne CampusMcQueen so that I can discuss the case with him and see if he will be able to come and evaluate the patient's airway as well.  1:12 AM Dr. Jenne CampusMcQueen evaluated the patient in the ED with fiber optic scope and will take the patient to the operating room.  Anesthesia has been paged and Dr. Jenne CampusMcQueen will talk to them.  Ludwig angina or other severe soft tissue neck infection is still on the differential but the patient's impending airway  disaster takes precedence and we are facilitating his transportation up to the operating room as soon as possible.  (Note that documentation was delayed due to multiple ED patients requiring immediate care.)   I received a call from Dr. Jenne Campus after the intubation in the operating room, at that intubation was successful.  I spoke in person with Dr. Clint Guy, the hospitalist, who is giving the patient and empiric antibiotics and will proceed with a CT scan to determine if he has a soft tissue/neck infection as the cause of the angioedema. ____________________________________________  FINAL CLINICAL IMPRESSION(S) / ED DIAGNOSES  Final diagnoses:  Angioedema, initial encounter      NEW MEDICATIONS STARTED DURING THIS VISIT:  New Prescriptions   No medications on file     Loleta Rose, MD 04/22/15 [id number]  Loleta Rose, MD 04/22/15 [phone]

## 2015-04-22 NOTE — Transfer of Care (Signed)
Immediate Anesthesia Transfer of Care Note  Patient: Perry Bullock  Procedure(s) Performed: Procedure(s): INTUBATION-ENDOTRACHEAL WITH TRACHEOSTOMY STANDBY (N/A)  Patient Location: PACU and ICU  Anesthesia Type:General  Level of Consciousness: sedated  Airway & Oxygen Therapy: Patient placed on Ventilator (see vital sign flow sheet for setting)  Post-op Assessment: Report given to RN  Post vital signs: stable  Last Vitals:  Filed Vitals:   04/22/15 0130  BP: 110/78  Pulse: 99  Temp:   Resp:     Complications: No apparent anesthesia complications

## 2015-04-22 NOTE — Anesthesia Preprocedure Evaluation (Addendum)
Anesthesia Evaluation  Patient identified by MRN, date of birth, ID band Patient awake    Reviewed: Allergy & Precautions, NPO status , Patient's Chart, lab work & pertinent test results  Airway Mallampati: II  TM Distance: >3 FB Neck ROM: Limited    Dental  (+) Chipped   Pulmonary asthma , COPD,  COPD inhaler, former smoker,    Pulmonary exam normal breath sounds clear to auscultation       Cardiovascular negative cardio ROS Normal cardiovascular exam     Neuro/Psych Bipolar Disorder    GI/Hepatic negative GI ROS, (+) Hepatitis -, C  Endo/Other  negative endocrine ROS  Renal/GU negative Renal ROS  negative genitourinary   Musculoskeletal negative musculoskeletal ROS (+)   Abdominal Normal abdominal exam  (+)   Peds negative pediatric ROS (+)  Hematology negative hematology ROS (+)   Anesthesia Other Findings   Reproductive/Obstetrics                            Anesthesia Physical Anesthesia Plan  ASA: III and emergent  Anesthesia Plan: General   Post-op Pain Management:    Induction: Intravenous and Rapid sequence  Airway Management Planned: Oral ETT  Additional Equipment:   Intra-op Plan:   Post-operative Plan: Post-operative intubation/ventilation  Informed Consent: I have reviewed the patients History and Physical, chart, labs and discussed the procedure including the risks, benefits and alternatives for the proposed anesthesia with the patient or authorized representative who has indicated his/her understanding and acceptance.   Dental advisory given  Plan Discussed with: CRNA and Surgeon  Anesthesia Plan Comments:         Anesthesia Quick Evaluation

## 2015-04-22 NOTE — ED Notes (Signed)
Patient to OR before blood could be obtained. Patient will need CBC and CMP drawn in OR or on floor.

## 2015-04-22 NOTE — Consult Note (Signed)
Perry Bullock, Perry Bullock [id number] 11/19/1960 Perry Roseory Forbach, MD  Reason for Consult: Angioedema and airway obstruction  HPI: 54 year old gentleman who presented to the emergency room with difficulty breathing and swallowing. Earlier today had noted some mild difficulty with swallowing Progressively worse and presented to the emergency room he was evaluated by Dr. York Bullock and noted to have significant swelling of the floor mouth anterior neck and tongue. No history of trauma to the mouth or neck he has not been on an ACE inhibitor  Allergies: No Known Allergies  ROS: Review of systems normal other than 12 systems except per HPI.  PMH:  Past Medical History  Diagnosis Date  . Bipolar 1 disorder (HCC)   . Chronic back pain   . Asthma   . COPD (chronic obstructive pulmonary disease) (HCC)   . Hepatitis     FH:  Family History  Problem Relation Age of Onset  . Breast cancer Mother   . Hypertension Father   . Diabetes Father     SH:  Social History   Social History  . Marital Status: Single    Spouse Name: N/A  . Number of Children: N/A  . Years of Education: N/A   Occupational History  . Not on file.   Social History Main Topics  . Smoking status: Former Games developermoker  . Smokeless tobacco: Not on file  . Alcohol Use: No  . Drug Use: No  . Sexual Activity: Not on file   Other Topics Concern  . Not on file   Social History Narrative    PSH:  Past Surgical History  Procedure Laterality Date  . Eye surgery    . Mandible surgery      Physical  Exam: Adult male lying in bed approximate 45 angle with mild stridor. Anterior tongue edema on the left but not protruding from the mouth mild to moderate floor mouth edema. CN 2-12 grossly intact and symmetric. EAC/TMs normal BL. Skin warm and dry. Nasal cavity without polyps or purulence. External nose and ears without masses or lesions. EOMI, PERRLA. Neck supple with no masses or lesions. No lymphadenopathy palpated. Thyroid normal with  no masses. Heart exam slight tachycardia but regular. Lungs had mild rhonchi equal breath sounds.  Procedure: Flexible fiberoptic laryngoscopy was performed through the left nostril after a topical anesthetic of phenylephrine and lidocaine solution. The flexible fiberoptic scope was advanced through the left nostril the nasopharynx appeared clear there was moderate edema of the left hypopharynx mild to moderate edema of the epiglottis significant edema over the arytenoids bilaterally the airway could be visualized but there was significant edema.   A/P: Angioedema with pending airway obstruction-we will take Perry Bullock emergently to the operating room where we will perform an emergent intubation either using the flexible fiberoptic intubation or using the glide scope. He is also been consented for possible tracheostomy she would not be able to us access the airway from above.   Perry Bullock 04/22/2015 1:21 AM

## 2015-04-22 NOTE — Consult Note (Addendum)
PULMONARY / CRITICAL CARE MEDICINE   Name: Perry Bullock MRN: [id number] DOB: 1961-05-06    ADMISSION DATE:  04/22/2015 CONSULTATION DATE: 04/22/15  REFERRING MD :  Dr. Clint GuyHower   CHIEF COMPLAINT:   Lip/tongue swelling, stridor  HISTORY OF PRESENT ILLNESS  History per chart review and wife at bedside 54 y.o. male with a known history of hepatitis C, recently diagnosed with possible necrotizing pneumonia (04/06/2015) who is presenting with an allergic reaction. The patient is unable to provide further information given mental status/medical condition as he is currently intubated/sedated in the intensive care unit. He was discharged on 04/06/2015 with discharge diagnosis of right upper lobe cavitary lesion/necrotizing pneumonia and placed on a four-week course of Augmentin. However 2 weeks into his antibiotic course started having allergic reaction; described as "lip and tongue swelling."  In the ED he had some difficulty swallowing however no difficulty breathing. He is evaluated emergently by ENT who took him to the OR and intubated him , their findings were consistent with angioedema including floor of the mouth and epiglottis edema, he was also noted his extremely poor dentition. After being successfully intubated without complication. His wife is at bedside, states that he has a hx history of EtOH abuse, smoked some cocaine (given by a friend) Thursday night.  Also, had an accidental fall, last week and was complaining of headaches.  Overall, per wife, his health has been declining over the past several months, with poor nutrition, poor appetite, and recent admission. Wife also stated on Thursday that he had a large amount of dark stool.  He has a hx of Diverticulosis, [facility].    SIGNIFICANT EVENTS  10/22>>possible angioedema from ? Augmentin>>intubated in the OR by ENT 10/7>> CT Chest>>RUL abscess and PNA>>started on Augmentin   PAST MEDICAL HISTORY    :  Past  Medical History  Diagnosis Date  . Bipolar 1 disorder (HCC)   . Chronic back pain   . Asthma   . COPD (chronic obstructive pulmonary disease) (HCC)   . Hepatitis    Past Surgical History  Procedure Laterality Date  . Eye surgery    . Mandible surgery     Prior to Admission medications   Medication Sig Start Date End Date Taking? Authorizing Provider  albuterol (PROVENTIL HFA;VENTOLIN HFA) 108 (90 BASE) MCG/ACT inhaler Inhale 1 puff into the lungs every 6 (six) hours as needed for wheezing or shortness of breath.    Historical Provider, MD  albuterol (PROVENTIL) (2.5 MG/3ML) 0.083% nebulizer solution Take 3 mLs (2.5 mg total) by nebulization every 6 (six) hours as needed for wheezing or shortness of breath. 04/06/15   Enedina FinnerSona Patel, MD  amoxicillin-clavulanate (AUGMENTIN) 875-125 MG tablet Take 1 tablet by mouth every 12 (twelve) hours. 1 tab two times a day for 4 weeks 04/06/15   Enedina FinnerSona Patel, MD  cetirizine (ZYRTEC) 10 MG tablet Take 10 mg by mouth daily.    Historical Provider, MD  fluticasone (FLONASE) 50 MCG/ACT nasal spray Place 1 spray into both nostrils daily.    Historical Provider, MD  gabapentin (NEURONTIN) 300 MG capsule Take 1,200 mg by mouth 2 (two) times daily.    Historical Provider, MD  guaiFENesin-dextromethorphan (ROBITUSSIN DM) 100-10 MG/5ML syrup Take 5 mLs by mouth every 6 (six) hours as needed for cough. 04/06/15   Enedina FinnerSona Patel, MD  latanoprost (XALATAN) 0.005 % ophthalmic solution Place 1 drop into both eyes at bedtime.    Historical Provider, MD  mometasone-formoterol [facility](DULERA)  100-5 MCG/ACT AERO Inhale 2 puffs into the lungs 2 (two) times daily.    Historical Provider, MD  omeprazole (PRILOSEC) 20 MG capsule Take 1 capsule (20 mg total) by mouth daily. 02/15/15 02/15/16  Gayla Doss, MD  ondansetron (ZOFRAN ODT) 4 MG disintegrating tablet Take 1 tablet (4 mg total) by mouth every 8 (eight) hours as needed for nausea or vomiting. 02/15/15   Gayla Doss, MD  ondansetron  (ZOFRAN) 4 MG tablet Take 1 tablet (4 mg total) by mouth every 8 (eight) hours as needed for nausea. 04/06/15   Enedina Finner, MD  oxyCODONE-acetaminophen (PERCOCET/ROXICET) 5-325 MG tablet Take 1 tablet by mouth every 6 (six) hours as needed for moderate pain. 04/06/15   Enedina Finner, MD  pantoprazole (PROTONIX) 40 MG tablet Take 40 mg by mouth daily.    Historical Provider, MD  QUEtiapine (SEROQUEL XR) 200 MG 24 hr tablet Take 200 mg by mouth 2 (two) times daily.    Historical Provider, MD  tiotropium (SPIRIVA) 18 MCG inhalation capsule Place 18 mcg into inhaler and inhale daily.    Historical Provider, MD  traZODone (DESYREL) 100 MG tablet Take 100 mg by mouth 3 (three) times daily.    Historical Provider, MD  zolpidem (AMBIEN) 10 MG tablet Take 10 mg by mouth at bedtime as needed for sleep.    Historical Provider, MD   No Known Allergies   FAMILY HISTORY   Family History  Problem Relation Age of Onset  . Breast cancer Mother   . Hypertension Father   . Diabetes Father       SOCIAL HISTORY  Per chart review  reports that he has quit smoking. He does not have any smokeless tobacco history on file. He reports that he does not drink alcohol or use illicit drugs.  ROS- unable to obtain, patient intubated and sedated    VITAL SIGNS    Temp:  [97.4 F (36.3 C)-97.6 F (36.4 C)] 97.4 F (36.3 C) (10/23 0800) Pulse Rate:  [83-113] 86 (10/23 0800) Resp:  [16-30] 21 (10/23 0800) BP: (89-139)/(65-104) 108/81 mmHg (10/23 0800) SpO2:  [95 %-100 %] 99 % (10/23 0800) FiO2 (%):  [21 %-40 %] 30 % (10/23 0749) Weight:  [157 lb 13.6 oz (71.6 kg)-165 lb (74.844 kg)] 157 lb 13.6 oz (71.6 kg) (10/23 0300) HEMODYNAMICS:   VENTILATOR SETTINGS: Vent Mode:  [-] PRVC FiO2 (%):  [21 %-40 %] 30 % Set Rate:  [14 bmp] 14 bmp Vt Set:  [500 mL] 500 mL PEEP:  [5 cmH20] 5 cmH20 Pressure Support:  [5 cmH20] 5 cmH20 Plateau Pressure:  [23 cmH20] 23 cmH20 INTAKE / OUTPUT:  Intake/Output Summary (Last 24  hours) at 04/22/15 0859 Last data filed at 04/22/15 0808  Gross per 24 hour  Intake 561.67 ml  Output    700 ml  Net -138.33 ml       PHYSICAL EXAM   Physical Exam  Constitutional: No distress.  HENT:  Head: Normocephalic.  Right Ear: External ear normal.  Left Ear: External ear normal.  Eyes: Pupils are equal, round, and reactive to light.  Neck: Neck supple.  Right sided neck swelling noted, area is soft  Cardiovascular: Normal rate.   Pulmonary/Chest: He has no rales.  Intubated. No resp distress.  Coarse upper airway sounds, dec BS Right upper anterior aspects, fine basilar crackles  Abdominal: Soft.  Musculoskeletal: He exhibits no edema.  Neurological:  Intubated and sedated  Skin: Skin is warm and dry.  Nursing note and vitals reviewed.      LABS   LABS:  CBC  Recent Labs Lab 04/22/15 0527  WBC 11.7*  HGB 11.9*  HCT 36.8*  PLT 348   Coag's No results for input(s): APTT, INR in the last 168 hours. BMET  Recent Labs Lab 04/22/15 0527  NA 138  K 4.5  CL 103  CO2 19*  BUN 15  CREATININE 1.16  GLUCOSE 79   Electrolytes  Recent Labs Lab 04/22/15 0527  CALCIUM 8.7*   Sepsis Markers No results for input(s): LATICACIDVEN, PROCALCITON, O2SATVEN in the last 168 hours. ABG  Recent Labs Lab 04/22/15 0400  PHART 7.26*  PCO2ART 38  PO2ART 178*   Liver Enzymes  Recent Labs Lab 04/22/15 0527  AST 17  ALT 11*  ALKPHOS 69  BILITOT 0.5  ALBUMIN 2.8*   Cardiac Enzymes No results for input(s): TROPONINI, PROBNP in the last 168 hours. Glucose  Recent Labs Lab 04/22/15 0800 04/22/15 0845  GLUCAP 86 93     Recent Results (from the past 240 hour(s))  MRSA PCR Screening     Status: Abnormal   Collection Time: 04/22/15  3:00 AM  Result Value Ref Range Status   MRSA by PCR POSITIVE (A) NEGATIVE Final    Comment:        The GeneXpert MRSA Assay (FDA approved for NASAL specimens only), is one component of a comprehensive MRSA  colonization surveillance program. It is not intended to diagnose MRSA infection nor to guide or monitor treatment for MRSA infections. CRITICAL RESULT CALLED TO, READ BACK BY AND VERIFIED WITH: DANA BLAKENEY AT 9811 ON 04/22/15 BY [facility]      Current facility-administered medications:  .  0.9 %  sodium chloride infusion, , Intravenous, Continuous, Wyatt Haste, MD, Last Rate: 100 mL/hr at 04/22/15 0323 .  acetaminophen (TYLENOL) tablet 650 mg, 650 mg, Oral, Q6H PRN **OR** acetaminophen (TYLENOL) suppository 650 mg, 650 mg, Rectal, Q6H PRN, Wyatt Haste, MD .  Ampicillin-Sulbactam (UNASYN) 3 g in sodium chloride 0.9 % 100 mL IVPB, 3 g, Intravenous, Q6H, Linus Salmons, MD, 3 g at 04/22/15 0808 .  antiseptic oral rinse solution (CORINZ), 7 mL, Mouth Rinse, 10 times per day, Wyatt Haste, MD, 7 mL at 04/22/15 0636 .  chlorhexidine gluconate (PERIDEX) 0.12 % solution 15 mL, 15 mL, Mouth Rinse, BID, Wyatt Haste, MD, 15 mL at 04/22/15 9147 .  dexamethasone (DECADRON) injection 10 mg, 10 mg, Intravenous, 4 times per day, Linus Salmons, MD, 10 mg at 04/22/15 [phone] .  fentaNYL (SUBLIMAZE) injection 25 mcg, 25 mcg, Intravenous, Q5 min PRN, Yves Dill, MD .  fluticasone (FLONASE) 50 MCG/ACT nasal spray 1 spray, 1 spray, Each Nare, Daily, Wyatt Haste, MD .  gabapentin (NEURONTIN) capsule 1,200 mg, 1,200 mg, Oral, BID, Wyatt Haste, MD, 1,200 mg at 04/22/15 0324 .  heparin injection 5,000 Units, 5,000 Units, Subcutaneous, 3 times per day, Wyatt Haste, MD, 5,000 Units at 04/22/15 [phone] .  latanoprost (XALATAN) 0.005 % ophthalmic solution 1 drop, 1 drop, Both Eyes, QHS, Wyatt Haste, MD .  loratadine (CLARITIN) tablet 10 mg, 10 mg, Oral, Daily, Wyatt Haste, MD .  mometasone-formoterol (DULERA) 100-5 MCG/ACT inhaler 2 puff, 2 puff, Inhalation, BID, Wyatt Haste, MD .  morphine 2 MG/ML injection 2 mg, 2 mg, Intravenous, Q4H PRN, Wyatt Haste, MD .  ondansetron [facility]) injection 4 mg, 4 mg,  Intravenous, Once PRN, Yves Dill, MD .  ondansetron (ZOFRAN) tablet 4 mg, 4 mg, Oral, Q6H PRN **OR** ondansetron (ZOFRAN) injection 4 mg, 4 mg, Intravenous, Q6H PRN, Wyatt Haste, MD .  oxyCODONE (Oxy IR/ROXICODONE) immediate release tablet 5 mg, 5 mg, Oral, Q4H PRN, Wyatt Haste, MD .  pantoprazole (PROTONIX) EC tablet 40 mg, 40 mg, Oral, Daily, Wyatt Haste, MD .  propofol (DIPRIVAN) 1000 MG/100ML infusion, 5-80 mcg/kg/min, Intravenous, Titrated, Wyatt Haste, MD, Last Rate: 20.2 mL/hr at 04/22/15 0800, 45 mcg/kg/min at 04/22/15 0800 .  QUEtiapine (SEROQUEL XR) 24 hr tablet 200 mg, 200 mg, Oral, BID, Wyatt Haste, MD, 200 mg at 04/22/15 0324 .  sodium chloride 0.9 % injection 3 mL, 3 mL, Intravenous, Q12H, Wyatt Haste, MD, 3 mL at 04/22/15 0325 .  tiotropium (SPIRIVA) inhalation capsule 18 mcg, 18 mcg, Inhalation, Daily, Wyatt Haste, MD .  traZODone (DESYREL) tablet 100 mg, 100 mg, Oral, TID, Wyatt Haste, MD  IMAGING    No results found.    Indwelling Urinary Catheter continued, requirement due to   Reason to continue Indwelling Urinary Catheter for strict Intake/Output monitoring for hemodynamic instability   Central Line continued, requirement due to   Reason to continue [employer] Monitoring of central venous pressure or other hemodynamic parameters   Ventilator continued, requirement due to, resp failure    Ventilator Sedation RASS 0 to -2   Lines:  Cultures: MRSA PCR 10/22>>positive Bld Cx x 2 10/23>> UA 10/23>>  Antibiotics: Unasyn 10/22>>   ASSESSMENT/PLAN   54 year old Caucasian gentleman history of hepatitis C recently diagnosed with necrotizing pneumonia right upper lobe now admitted and intubated for suspected angioedema   PULMONARY OETT 10/22 in the OR Respiratory failure Angioedema: Questionable etiology, examination findings by ENT consistent with angioedema.  He could have a  periodontal abscess given the state of his dentition. -  Continue full vent support wean FiO2 and PEEP as tolerated check ABG as needed - Propofol for sedation - cont with Unasyn, obtain CT Maxillofacial Right upper lobe necrotic lesion/Lung Abscess:  - cont with unasyn for now - obtain CT chest also - may need to expand abx coverage with carbapenem and flagyl COPD without acute exacerbation: continue with spiriva and dulera, currently on decadron  Q6 hrs, consider weaning this dose of steroid in the next 24 hrs.   CARDIOVASCULAR Cont with hemodynamic monitoring Monitor BP while on propofol  RENAL Monitor lytes - replace per ICU protocol  GASTROINTESTINAL A: Hx of diverticulosis (sigmoid) Hx of liver mass Chronic colovesical fistula Hx of Liver abcess - s/p drain in 2012 ? melena - cont with PPI - liver mass and fistula previous [facility] CT A/P  HEMATOLOGIC - no significant leukocytosis - check blood cultures given clinical decline  INFECTIOUS A: ?dental abscess RUL PNA Hx of drug/EtOH abuse P:   - cont with unasyn for now, may need to expand coverage pending CT chest and maxillofacial results - check urine tox, and ECHO  ENDOCRINE -ssi as needed  NEUROLOGIC Accidental Fall Headache - chect Ct Head RASS goal: -1 while on vent     I have personally obtained a history, examined the patient, evaluated laboratory and imaging results, formulated the assessment and plan and placed orders.  The Patient requires high complexity decision making for assessment and support, frequent evaluation and titration of therapies, application of advanced monitoring technologies and extensive interpretation of multiple databases. Critical Care Time devoted to patient care  services described in this note is 45 minutes.   Overall, patient is critically ill, prognosis is guarded. Patient at high risk for cardiac arrest and death.   Stephanie Acre, MD [facility] Pager [phone] (please enter 7-digits) On Call Pager - [phone] (please enter 7-digits)     04/22/2015, 8:59 AM

## 2015-04-22 NOTE — H&P (Signed)
[facility] [place] [facility]   PATIENT NAME: Abanoub Hanken    MR#:  [id number]  DATE OF BIRTH:  03/29/1961   DATE OF ADMISSION:  04/22/2015  PRIMARY CARE PHYSICIAN: [facility]   REQUESTING/REFERRING PHYSICIAN: [place]  CHIEF COMPLAINT:   Chief Complaint  Patient presents with  . Allergic Reaction    HISTORY OF PRESENT ILLNESS:  Gailen Venne  is a 54 y.o. male with a known history of hepatitis C, recently diagnosed with possible necrotizing pneumonia (04/06/2015) who is presenting with an allergic reaction. The patient is unable to provide further information given mental status/medical condition as he is currently intubated/sedated in the intensive care unit. History obtained from ENT,nursing staff, documentation. He was discharged on 04/06/2015 with discharge diagnosis of right upper lobe cavitary lesion/necrotizing pneumonia and placed on a four-week course of Augmentin. However 2 weeks into his antibiotic course he stated today that he follow he is having allergic reaction. This was described as "lip and tongue swelling" because of the above symptoms presents to Hospital further workup and evaluation. Upon arrival he had some difficulty swallowing however no difficulty breathing. He is evaluated emergently by ENT who performed scoping followed by intubation. Findings were consistent with angioedema including floor of the mouth and epiglottis edema, he was also noted his extremely poor dentition. After being successfully intubated without complication is brought to the intensive care unit where I took over medical care. Currently is intubated/sedated in critical but stable condition  PAST MEDICAL HISTORY:   Past Medical History  Diagnosis Date  . Bipolar 1 disorder (HCC)   . Chronic back pain   . Asthma   . COPD (chronic obstructive pulmonary disease) (HCC)   . Hepatitis     PAST SURGICAL HISTORY:   Past Surgical  History  Procedure Laterality Date  . Eye surgery    . Mandible surgery      SOCIAL HISTORY:   Social History  Substance Use Topics  . Smoking status: Former Games developer  . Smokeless tobacco: Not on file  . Alcohol Use: No    FAMILY HISTORY:   Family History  Problem Relation Age of Onset  . Breast cancer Mother   . Hypertension Father   . Diabetes Father     DRUG ALLERGIES:  No Known Allergies  REVIEW OF SYSTEMS:  Unable to obtain at this time given patient's mental status/medical condition   MEDICATIONS AT HOME:   Prior to Admission medications   Medication Sig Start Date End Date Taking? Authorizing Provider  albuterol (PROVENTIL HFA;VENTOLIN HFA) 108 (90 BASE) MCG/ACT inhaler Inhale 1 puff into the lungs every 6 (six) hours as needed for wheezing or shortness of breath.    Historical Provider, MD  albuterol (PROVENTIL) (2.5 MG/3ML) 0.083% nebulizer solution Take 3 mLs (2.5 mg total) by nebulization every 6 (six) hours as needed for wheezing or shortness of breath. 04/06/15   Enedina Finner, MD  amoxicillin-clavulanate (AUGMENTIN) 875-125 MG tablet Take 1 tablet by mouth every 12 (twelve) hours. 1 tab two times a day for 4 weeks 04/06/15   Enedina Finner, MD  cetirizine (ZYRTEC) 10 MG tablet Take 10 mg by mouth daily.    Historical Provider, MD  fluticasone (FLONASE) 50 MCG/ACT nasal spray Place 1 spray into both nostrils daily.    Historical Provider, MD  gabapentin (NEURONTIN) 300 MG capsule Take 1,200 mg by mouth 2 (two) times daily.    Historical Provider, MD  guaiFENesin-dextromethorphan [facility] DM)  100-10 MG/5ML syrup Take 5 mLs by mouth every 6 (six) hours as needed for cough. 04/06/15   Enedina FinnerSona Patel, MD  latanoprost (XALATAN) 0.005 % ophthalmic solution Place 1 drop into both eyes at bedtime.    Historical Provider, MD  mometasone-formoterol (DULERA) 100-5 MCG/ACT AERO Inhale 2 puffs into the lungs 2 (two) times daily.    Historical Provider, MD  omeprazole (PRILOSEC) 20 MG  capsule Take 1 capsule (20 mg total) by mouth daily. 02/15/15 02/15/16  Gayla DossEryka A Gayle, MD  ondansetron (ZOFRAN ODT) 4 MG disintegrating tablet Take 1 tablet (4 mg total) by mouth every 8 (eight) hours as needed for nausea or vomiting. 02/15/15   Gayla DossEryka A Gayle, MD  ondansetron (ZOFRAN) 4 MG tablet Take 1 tablet (4 mg total) by mouth every 8 (eight) hours as needed for nausea. 04/06/15   Enedina FinnerSona Patel, MD  oxyCODONE-acetaminophen (PERCOCET/ROXICET) 5-325 MG tablet Take 1 tablet by mouth every 6 (six) hours as needed for moderate pain. 04/06/15   Enedina FinnerSona Patel, MD  pantoprazole (PROTONIX) 40 MG tablet Take 40 mg by mouth daily.    Historical Provider, MD  QUEtiapine (SEROQUEL XR) 200 MG 24 hr tablet Take 200 mg by mouth 2 (two) times daily.    Historical Provider, MD  tiotropium (SPIRIVA) 18 MCG inhalation capsule Place 18 mcg into inhaler and inhale daily.    Historical Provider, MD  traZODone (DESYREL) 100 MG tablet Take 100 mg by mouth 3 (three) times daily.    Historical Provider, MD  zolpidem (AMBIEN) 10 MG tablet Take 10 mg by mouth at bedtime as needed for sleep.    Historical Provider, MD      VITAL SIGNS:  Blood pressure 110/78, pulse 99, temperature 97.6 F (36.4 C), temperature source Axillary, resp. rate 18, height 6' (1.829 m), weight 165 lb (74.844 kg), SpO2 100 %.  PHYSICAL EXAMINATION:   VITAL SIGNS: Filed Vitals:   04/22/15 0130  BP: 110/78  Pulse: 99  Temp:   Resp:    GENERAL:54 y.o.male moderate distress given mental status.  HEAD: Normocephalic, atraumatic.  EYES: Pupils equal, round, reactive to light. Unable to assess extraocular muscles given mental status/medical condition. No scleral icterus.  MOUTH: Moist mucosal membrane. Dentition poor. No abscess noted. ET tube in place EAR, NOSE, THROAT: Clear without exudates. No external lesions.  NECK: Supple. No thyromegaly. No nodules. No JVD.  PULMONARY: Coarse breath sounds without frank wheeze rails or rhonci. No use of  accessory muscles, currently intubated/sedated on mechanical ventilation CHEST: Nontender to palpation.  CARDIOVASCULAR: S1 and S2. Regular rate and rhythm. No murmurs, rubs, or gallops. No edema. Pedal pulses 2+ bilaterally.  GASTROINTESTINAL: Soft, nontender, nondistended. No masses. Positive bowel sounds. No hepatosplenomegaly.  MUSCULOSKELETAL: No swelling, clubbing, or edema. Range of motion full in all extremities.  NEUROLOGIC: Unable to assess given mental status/medical condition SKIN: No ulceration, lesions, rashes, or cyanosis. Skin warm and dry. Turgor intact.  PSYCHIATRIC: Unable to assess given mental status/medical condition      LABORATORY PANEL:   CBC No results for input(s): WBC, HGB, HCT, PLT in the last 168 hours. ------------------------------------------------------------------------------------------------------------------  Chemistries  No results for input(s): NA, K, CL, CO2, GLUCOSE, BUN, CREATININE, CALCIUM, MG, AST, ALT, ALKPHOS, BILITOT in the last 168 hours.  Invalid input(s): GFRCGP ------------------------------------------------------------------------------------------------------------------  Cardiac Enzymes No results for input(s): TROPONINI in the last 168 hours. ------------------------------------------------------------------------------------------------------------------  RADIOLOGY:  No results found.  EKG:   Orders placed or performed during the hospital encounter of 04/03/15  .  EKG 12-Lead  . EKG 12-Lead  . ED EKG  . ED EKG  . EKG    IMPRESSION AND PLAN:   54 year old Caucasian gentleman history of hepatitis C recently diagnosed with necrotizing pneumonia right upper lobe presenting with an allergic reaction.  1. Angioedema: Questionable etiology at this time it seems unlikely of the antibiotic could be the problem given that he has been on for 2 weeks total duration thus far. It seems more likely that this could be related to  periodontal abscess given the state of his dentition. Continue full vent support wean FiO2 and PEEP as tolerated check ABG with 1 hour after initial intubation. Propofol for sedation As for dentition is early on antibiotic coverage with Unasyn he will need CT imaging to further localize any abscess. 2. Right upper lobe necrotic lesion: We'll reconsult infectious disease continue Unasyn for now as this should provide more than adequate coverage 3. COPD without acute exacerbation: Continue home medications 4. GERD with [place] allergies: PPI therapy 5. Venous thrombus embolism prophylactic: Heparin subcutaneous   All the records are reviewed and case discussed with ED provider. Management plans discussed with the patient, family and they are in agreement.  CODE STATUS: Full  TOTAL TIME TAKING CARE OF THIS PATIENT: 55 critical care  minutes.    Darcella Shiffman,  Mardi Mainland.D on 04/22/2015 at 2:57 AM  Between 7am to 6pm - Pager - [phone]  After 6pm: House Pager: - [phone]  [facility]  Office  [phone]  CC: Primary care physician; [facility]

## 2015-04-22 NOTE — Progress Notes (Signed)
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

## 2015-04-22 NOTE — ED Notes (Signed)
Patient presents to Emergency Department via EMS with complaints of allergic reaction.  Per EMS "Diastolic reaction"  Pt taking Amoxicillin from ED visit 2 weeks ago.  Pt with tongue and lip swelling, reports no trouble breathing but difficulty swallowing.

## 2015-04-23 ENCOUNTER — Inpatient Hospital Stay: Payer: Medicaid Other

## 2015-04-23 LAB — BLOOD GAS, ARTERIAL
Acid-base deficit: 0.6 mmol/L (ref 0.0–2.0)
Bicarbonate: 23.2 mEq/L (ref 21.0–28.0)
FIO2: 0.24
LHR: 14 {breaths}/min
MECHANICAL RATE: 14
MECHVT: 500 mL
O2 Saturation: 97.6 %
PH ART: 7.43 (ref 7.350–7.450)
Patient temperature: 37
pCO2 arterial: 35 mmHg (ref 32.0–48.0)
pO2, Arterial: 95 mmHg (ref 83.0–108.0)

## 2015-04-23 LAB — GLUCOSE, CAPILLARY: Glucose-Capillary: 114 mg/dL — ABNORMAL HIGH (ref 65–99)

## 2015-04-23 MED ORDER — HYDROMORPHONE HCL 1 MG/ML IJ SOLN
INTRAMUSCULAR | Status: AC
  Filled 2015-04-23: qty 1

## 2015-04-23 MED ORDER — FREE WATER
200.0000 mL | Freq: Three times a day (TID) | Status: DC
  Administered 2015-04-23: 200 mL

## 2015-04-23 MED ORDER — IOHEXOL 350 MG/ML SOLN
100.0000 mL | Freq: Once | INTRAVENOUS | Status: AC | PRN
  Administered 2015-04-23: 100 mL via INTRAVENOUS

## 2015-04-23 MED ORDER — ATROPINE SULFATE 0.1 MG/ML IJ SOLN
INTRAMUSCULAR | Status: AC
Start: 2015-04-23 — End: 2015-04-23
  Filled 2015-04-23: qty 10

## 2015-04-23 MED ORDER — HYDROMORPHONE HCL 1 MG/ML IJ SOLN
1.0000 mg | Freq: Once | INTRAMUSCULAR | Status: AC
  Administered 2015-04-23: 1 mg via INTRAVENOUS

## 2015-04-23 MED ORDER — VITAL HIGH PROTEIN PO LIQD
1000.0000 mL | ORAL | Status: DC
  Administered 2015-04-23: 1000 mL

## 2015-04-23 NOTE — Progress Notes (Signed)
Initial Nutrition Assessment      INTERVENTION:  EN: MD Kasa wanting to start enteral nutrition at this time.  Recommend vital high protein at 36ml/hr. Will provide 1200 kcals and 105 g of protein, free water.  Recommend checking Mg and Phosphorus in am. Minimal free water flush of 25 ml q 4 hr at this time.    NUTRITION DIAGNOSIS:   Increased nutrient needs related to acute illness as evidenced by NPO status.    GOAL:   Provide needs based on ASPEN/SCCM guidelines    MONITOR:    (Energy intake, Electrolyte and renal profile, Digestive system)  REASON FOR ASSESSMENT:   Consult, Ventilator Enteral/tube feeding initiation and management  ASSESSMENT:      Pt admitted with angioedema requiring intubation, pneumonia, drug use prior to admission  Past Medical History  Diagnosis Date  . Bipolar 1 disorder (HCC)   . Chronic back pain     Diverticulosis  . Asthma   . COPD (chronic obstructive pulmonary disease) (HCC)   . Hepatitis   . GERD (gastroesophageal reflux disease)     Current Nutrition: NPO  Food/Nutrition-Related History: unable to obtain at this time   Scheduled Medications:  . antiseptic oral rinse  7 mL Mouth Rinse 10 times per day  . atropine      . budesonide  0.5 mg Nebulization BID  . chlorhexidine gluconate  15 mL Mouth Rinse BID  . Chlorhexidine Gluconate Cloth  6 each Topical Q0600  . dexamethasone  10 mg Intravenous 4 times per day  . famotidine (PEPCID) IV  20 mg Intravenous Q12H  . fluticasone  1 spray Each Nare Daily  . gabapentin  1,200 mg Oral BID  . heparin  5,000 Units Subcutaneous 3 times per day  . HYDROmorphone      . ipratropium-albuterol  3 mL Nebulization Q6H  . latanoprost  1 drop Both Eyes QHS  . loratadine  10 mg Oral Daily  . mupirocin ointment  1 application Nasal BID  . QUEtiapine  200 mg Oral BID  . sodium chloride  3 mL Intravenous Q12H  . traZODone  100 mg Oral TID    Continuous Medications:  . sodium  chloride 100 mL/hr at 04/23/15 0340  . propofol (DIPRIVAN) infusion 45 mcg/kg/min (04/23/15 1205)   Diprivan providing 533 kcals in next 24 hr if kept at 20.59ml/hr  Electrolyte/Renal Profile and Glucose Profile:   Recent Labs Lab 04/22/15 0527  NA 138  K 4.5  CL 103  CO2 19*  BUN 15  CREATININE 1.16  CALCIUM 8.7*  GLUCOSE 79   Protein Profile:   Recent Labs Lab 04/22/15 0527  ALBUMIN 2.8*    Gastrointestinal Profile: Last BM: PTA   Nutrition-Focused Physical Exam Findings: Unable to complete Nutrition-Focused physical exam at this time.     Weight Change: per wt encounters 5 % wt loss in the last 4 months    Diet Order:  Diet NPO time specified  Skin:   reviewed  Height:   Ht Readings from Last 1 Encounters:  04/22/15 6' (1.829 m)    Weight:   Wt Readings from Last 1 Encounters:  04/22/15 157 lb 13.6 oz (71.6 kg)       BMI:  Body mass index is 21.4 kg/(m^2).  Estimated Nutritional Needs:   Kcal:  TEE 1732 kcals (Ve 8.3, T max 36.9)  Protein:  (1.2-2.0 g/kg) 86-144 g/d  Fluid:  (25-77ml/kg) 1800-2179ml/d  EDUCATION NEEDS:   No education needs  identified at this time  HIGH Care Level  Raelynne Ludwick B. Freida BusmanAllen, RD, LDN [phone] (pager)

## 2015-04-23 NOTE — Progress Notes (Signed)
[facility] - [place] at  [facility]   PATIENT NAME: Perry Bullock    MR#:  [id number]  DATE OF BIRTH:  February 07, 1961  SUBJECTIVE:  CHIEF COMPLAINT:   Chief Complaint  Patient presents with  . Allergic Reaction   Sedated & intubated.  No acute events overnight. Hemodynamically stable.   REVIEW OF SYSTEMS:   ROS unable to obtain, patient sedated  DRUG ALLERGIES:  No Known Allergies  VITALS:  Blood pressure 108/80, pulse 45, temperature 97.7 F (36.5 C), temperature source Oral, resp. rate 14, height 6' (1.829 m), weight 71.6 kg (157 lb 13.6 oz), SpO2 97 %.  PHYSICAL EXAMINATION:  GENERAL:  54 y.o.-year-old patient lying in the bed with no acute distress, sedated EYES: Pupils equal, round, reactive to light and accommodation. Conjunctiva clear.  HEENT: Head atraumatic, normocephalic ET tube in place. Dry oral mucosa NECK:  Supple, no jugular venous distention. No thyroid enlargement, no tenderness. No JVD or swelling.  LUNGS: on full ventilator support with Normal breath sounds bilaterally, no wheezing, rales, rhonchi or crepitation. No use of accessory muscles of respiration. Good air movement. Minimal secretions. CARDIOVASCULAR: S1, S2 normal. No murmurs, rubs, or gallops.  ABDOMEN: Soft, nontender, nondistended. Bowel sounds present. No organomegaly or mass.  EXTREMITIES: No pedal edema, cyanosis, or clubbing. Pulses 2+ NEUROLOGIC: sedated for ventilation PSYCHIATRIC: sedated for ventilation SKIN: No obvious rash, lesion, or ulcer.    LABORATORY PANEL:   CBC  Recent Labs Lab 04/22/15 0527  WBC 11.7*  HGB 11.9*  HCT 36.8*  PLT 348   ------------------------------------------------------------------------------------------------------------------  Chemistries   Recent Labs Lab 04/22/15 0527  NA 138  K 4.5  CL 103  CO2 19*  GLUCOSE 79  BUN 15  CREATININE 1.16  CALCIUM 8.7*  AST 17  ALT 11*  ALKPHOS 69  BILITOT 0.5    ------------------------------------------------------------------------------------------------------------------  Cardiac Enzymes No results for input(s): TROPONINI in the last 168 hours. ------------------------------------------------------------------------------------------------------------------  RADIOLOGY:  Ct Head Wo Contrast  04/22/2015  CLINICAL DATA:  Respiratory failure, angioedema, intubation EXAM: CT HEAD WITHOUT CONTRAST TECHNIQUE: Contiguous axial images were obtained from the base of the skull through the vertex without intravenous contrast. COMPARISON:  None. FINDINGS: Generalized atrophy. Normal ventricular morphology. No midline shift or mass effect. Normal appearance of brain parenchyma. No intracranial hemorrhage, mass lesion or evidence acute infarction. No extra-axial fluid collections. Deviation of nose and nasal septum to the LEFT. Visualized paranasal sinuses and mastoid air cells clear. No acute osseous findings. IMPRESSION: Generalized atrophy. No acute intracranial abnormalities. Electronically Signed   By: Ulyses Southward M.D.   On: 04/22/2015 11:03   Ct Chest W Contrast  04/22/2015  CLINICAL DATA:  Allergic reaction, on antibiotics for right upper lobe cavitary lesion/necrotizing pneumonia EXAM: CT CHEST WITH CONTRAST TECHNIQUE: Multidetector CT imaging of the chest was performed during intravenous contrast administration. CONTRAST:  75mL OMNIPAQUE IOHEXOL 300 MG/ML  SOLN COMPARISON:  CT chest dated 04/03/2015 FINDINGS: Mediastinum/Nodes: The heart is normal in size. No pericardial effusion. Mild atherosclerotic calcifications of the aortic arch. Small mediastinal lymph nodes, including an 11 mm short axis high right paratracheal node near the thoracic inlet (series 2/ image 9). Visualized thyroid is unremarkable. Lungs/Pleura: Endotracheal tube terminates 4 cm above the carina. Improving thick-walled cavitary lesion in the right lung apex now measuring approximately  7.3 x 4.9 cm, previously 10.8 x 7.8 cm. Additional satellite nodularity in the right middle lobe and right lower lobe (series 3/ image 31), also improving. Given the interval  change following antibiotic therapy, these findings are compatible with improving pneumonia. Small left pleural effusion, some of which is chronic with calcifications. Trace right pleural effusion. Underlying mild emphysematous changes with scattered areas of scarring. No pneumothorax. Upper abdomen: Visualized upper abdomen is notable for hepatic steatosis and an enteric tube that terminates in the proximal gastric body. Musculoskeletal: Degenerative changes of the visualized thoracolumbar spine. Mild superior endplate compression fracture deformity at T11, unchanged. IMPRESSION: Improving thick-walled cavitary lesion in the right lung apex, now measuring approximate 7.3 x 4.9 cm. Improving satellite nodularity in the right middle and lower lobes. Overall appearance is compatible with improving multifocal pneumonia. Electronically Signed   By: Charline Bills M.D.   On: 04/22/2015 11:17   Ct Abdomen Pelvis W Contrast  04/23/2015  CLINICAL DATA:  54 year old male with a history of cavitary pneumonia status post anaphylactic reaction with angioedema. Patient is now intubated and in the intensive care unit. Prior history of diverticulosis and diverticulitis. EXAM: CT ABDOMEN AND PELVIS WITH CONTRAST TECHNIQUE: Multidetector CT imaging of the abdomen and pelvis was performed using the standard protocol following bolus administration of intravenous contrast. CONTRAST:  OMNIPAQUE IOHEXOL 350 MG/ML SOLN COMPARISON:  Recent prior CT scan of the chest 04/22/2015; prior CT of the abdomen 10/11/2010 FINDINGS: Lower Chest: Incompletely imaged left-sided pleural thickening. No suspicious pulmonary nodule or mass. Visualized cardiac structures within normal limits for size. No pericardial effusion. A nasogastric tube passes through the esophagus  en route to the stomach. Abdomen: A nasogastric tube terminates within the stomach. Otherwise, unremarkable CT appearance of the stomach and duodenum save for a parity ampullary duodenum diverticulum. Unremarkable CT appearance of the spleen, adrenal glands and pancreas. The liver is diffusely low in attenuation consistent with hepatic steatosis. There is some mild focal sparing around the gallbladder fossa. No focal lesion. Gallbladder is unremarkable. No intra or extrahepatic biliary ductal dilatation. Unremarkable appearance of the bilateral kidneys. No focal solid lesion, hydronephrosis or nephrolithiasis. Sigmoid colonic diverticulosis. Thickening of the sigmoid colon proximal to the rectosigmoid junction. There is a small 1.7 x 1.5 cm fluid and gas collection in the perisigmoid fat best seen on image 69 of series 2. This appears to communicate with 2 separate segments of the sigmoid colon concerning for safe colocolonic fistula. 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 This fistula passes adjacent to a loop of small bowel bed displays is secondary wall thickening. Normal appendix in the right lower quadrant. The terminal ileum is unremarkable. Pelvis: A Foley catheter is present within the largely decompressed bladder. Unremarkable prostate gland and seminal vesicles. Small free fluid in the perirectal fat as above. Bones/Soft Tissues: No acute fracture or aggressive appearing lytic or blastic osseous lesion. Multilevel degenerative disc disease most severe at L4-L5 and L5-S1. There is mild grade 1 anterolisthesis of L4 on L5 which is unchanged compared to 10/11/2010. There has been mild interval progression of the underlying degenerative disc disease. Stable small sclerotic foci in the superior endplate of T10 and inferior endplate of T11. Vascular: Atherosclerotic  vascular disease without significant stenosis or aneurysmal dilatation. IMPRESSION: 1. 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 2. Secondary reactive thickening/inflammation of affecting the wall of an adjacent loop of small bowel in the posterior aspect of the bladder. 3. Small free fluid in the right aspect of the pelvic cul-de-sac likely related to the process above. 4. Incompletely imaged left-sided pleural thickening. 5. Hepatic steatosis. 6. Multilevel degenerative disc disease most severe at L4-L5 and L5-S1. 7. Stable grade 1 anterolisthesis of L4 on L5 which appears to be degenerative in etiology. 8. Atherosclerotic vascular calcifications. Electronically Signed   By: Malachy MoanHeath  McCullough M.D.   On: 04/23/2015 11:01   Ct Maxillofacial W/cm  04/22/2015  CLINICAL DATA:  Intubation, respiratory failure, angioedema with swelling of the lips, tongue, mouth and epiglottis EXAM: CT MAXILLOFACIAL WITH CONTRAST TECHNIQUE: Multidetector CT imaging of the maxillofacial structures was performed with intravenous contrast. Multiplanar CT image reconstructions were also generated. A small metallic BB was placed on the right temple in order to reliably differentiate right from left. CONTRAST:  75mL OMNIPAQUE IOHEXOL 300 MG/ML  SOLN IV COMPARISON:  None FINDINGS: Generalized cerebral atrophy. Visualized intracranial structures otherwise unremarkable. Orbital soft tissue planes clear. Deviation of nasal septum and nasal bones to the LEFT. Endotracheal tube enters larynx. Nasogastric tube enters proximal esophagus. Symmetric appearance of parotid and submandibular glands bilaterally. Diffuse  soft tissue edema identified at the parapharyngeal and laryngeal soft tissues as well as adjacent to the tongue muscles. No discrete loculated fluid collection is identified. Small amount of fluid within the oral cavity and dependently within nasopharynx. Prevertebral soft tissues normal thickness. Scattered subcutaneous edema as well as edema with at the lips and oral cavity. Vascular structures grossly patent. No definite mass or adenopathy. Paranasal sinuses, mastoid air cells and middle ear cavities clear. Bones demineralized without acute fracture. Probable fracture involving medial wall of the LEFT orbit. Visualized portion of cervical spine significant for degenerative disc and facet disease changes. Numerous dental caries without significant periodontal disease. IMPRESSION: Scattered soft tissue edema diffusely in the parapharyngeal space, tongue, perilingual soft tissues, oral soft tissues, lips, as well as at the soft palate consistent with history of angioedema. No definite prevertebral fluid collection identified. No loculated fluid collection, adenopathy or mass identified. Electronically Signed   By: Ulyses SouthwardMark  Boles M.D.   On: 04/22/2015 11:15     ASSESSMENT AND PLAN:   1) angioedema requiring intubation and ventilation: - etiology unclear. CT with no abscess. Had been on augmentin for 2 weeks. Per HPI he did smoke cocaine on the night prior to admission and may have had an unknown exposure at that time - appreciate Pulmonary help and will plan for weaning tomorrow a.m.  - continue dexamethasone, pepcid, claritin  2) Hx of RUL necrotizing pneumonia - CT chest on this admission showing improved in the right lung apex lesion.  Afebrile, hemodynamically stable. No secretions.  - off abx presently.  Await ID input.  - ECHO showing normal EF.    3) COPD: no exacerbation - continue duo nebs, Spiriva, dulera, decadron  4) ETOH abuse - no signs of withdrawal  5) hypotension - likely due to  sedation/ventilation.  Off pressors.  - continue fluid support, goal MAP >65  6) Glaucoma - cont. Xalantan eye drops.   CODE STATUS: full  TOTAL Critical care TIME TAKING CARE OF THIS PATIENT: 30 minutes.   POSSIBLE D/C IN 2-3 DAYS, DEPENDING ON CLINICAL CONDITION.   [place] SirenSAINANI,Lexany Belknap J M.D on 04/23/2015 at 2:38 PM  Between 7am to 6pm -  Pager - [phone]  After 6pm go to [email or web address] - password [email or web address]  [place] [facility]  Office  [phone]  CC: Primary care physician; [facility]

## 2015-04-23 NOTE — Consult Note (Signed)
PULMONARY / CRITICAL CARE MEDICINE   Name: Perry Bullock MRN: [id number] DOB: 1960-07-08    ADMISSION DATE:  04/22/2015 CONSULTATION DATE: 04/22/15  REFERRING MD :  Dr. Clint Guy   CHIEF COMPLAINT:   Lip/tongue swelling, stridor  HISTORY OF PRESENT ILLNESS  Lips/tongue still swollen, on steroids, on vent, intubated,sedated Will start Tf's today  SIGNIFICANT EVENTS  10/22>>possible angioedema from ? Augmentin>>intubated in the OR by ENT 10/7>> CT Chest>>RUL abscess and PNA>>started on Augmentin   PAST MEDICAL HISTORY    :  Past Medical History  Diagnosis Date  . Bipolar 1 disorder (HCC)   . Chronic back pain     Diverticulosis  . Asthma   . COPD (chronic obstructive pulmonary disease) (HCC)   . Hepatitis   . GERD (gastroesophageal reflux disease)    Past Surgical History  Procedure Laterality Date  . Eye surgery    . Mandible surgery     Prior to Admission medications   Medication Sig Start Date End Date Taking? Authorizing Provider  albuterol (PROVENTIL HFA;VENTOLIN HFA) 108 (90 BASE) MCG/ACT inhaler Inhale 1 puff into the lungs every 6 (six) hours as needed for wheezing or shortness of breath.    Historical Provider, MD  albuterol (PROVENTIL) (2.5 MG/3ML) 0.083% nebulizer solution Take 3 mLs (2.5 mg total) by nebulization every 6 (six) hours as needed for wheezing or shortness of breath. 04/06/15   Enedina Finner, MD  amoxicillin-clavulanate (AUGMENTIN) 875-125 MG tablet Take 1 tablet by mouth every 12 (twelve) hours. 1 tab two times a day for 4 weeks 04/06/15   Enedina Finner, MD  cetirizine (ZYRTEC) 10 MG tablet Take 10 mg by mouth daily.    Historical Provider, MD  fluticasone (FLONASE) 50 MCG/ACT nasal spray Place 1 spray into both nostrils daily.    Historical Provider, MD  gabapentin (NEURONTIN) 300 MG capsule Take 1,200 mg by mouth 2 (two) times daily.    Historical Provider, MD  guaiFENesin-dextromethorphan (ROBITUSSIN DM) 100-10 MG/5ML syrup Take 5 mLs by mouth every 6  (six) hours as needed for cough. 04/06/15   Enedina Finner, MD  latanoprost (XALATAN) 0.005 % ophthalmic solution Place 1 drop into both eyes at bedtime.    Historical Provider, MD  mometasone-formoterol (DULERA) 100-5 MCG/ACT AERO Inhale 2 puffs into the lungs 2 (two) times daily.    Historical Provider, MD  omeprazole (PRILOSEC) 20 MG capsule Take 1 capsule (20 mg total) by mouth daily. 02/15/15 02/15/16  Gayla Doss, MD  ondansetron (ZOFRAN ODT) 4 MG disintegrating tablet Take 1 tablet (4 mg total) by mouth every 8 (eight) hours as needed for nausea or vomiting. 02/15/15   Gayla Doss, MD  ondansetron (ZOFRAN) 4 MG tablet Take 1 tablet (4 mg total) by mouth every 8 (eight) hours as needed for nausea. 04/06/15   Enedina Finner, MD  oxyCODONE-acetaminophen (PERCOCET/ROXICET) 5-325 MG tablet Take 1 tablet by mouth every 6 (six) hours as needed for moderate pain. 04/06/15   Enedina Finner, MD  pantoprazole (PROTONIX) 40 MG tablet Take 40 mg by mouth daily.    Historical Provider, MD  QUEtiapine (SEROQUEL XR) 200 MG 24 hr tablet Take 200 mg by mouth 2 (two) times daily.    Historical Provider, MD  tiotropium (SPIRIVA) 18 MCG inhalation capsule Place 18 mcg into inhaler and inhale daily.    Historical Provider, MD  traZODone (DESYREL) 100 MG tablet Take 100 mg by mouth 3 (three) times daily.    Historical Provider, MD  zolpidem (AMBIEN) 10  MG tablet Take 10 mg by mouth at bedtime as needed for sleep.    Historical Provider, MD   No Known Allergies   FAMILY HISTORY   Family History  Problem Relation Age of Onset  . Breast cancer Mother   . Hypertension Father   . Diabetes Father       SOCIAL HISTORY  Per chart review  reports that he has been smoking Cigarettes.  He does not have any smokeless tobacco history on file. He reports that he drinks about 3.6 oz of alcohol per week. He reports that he does not use illicit drugs.  ROS- unable to obtain, patient intubated and sedated    VITAL SIGNS     Temp:  [96.5 F (35.8 C)-98.6 F (37 C)] 96.5 F (35.8 C) (10/24 0800) Pulse Rate:  [51-85] 51 (10/24 0800) Resp:  [14-22] 14 (10/24 0800) BP: (81-120)/(63-90) 92/69 mmHg (10/24 0630) SpO2:  [91 %-98 %] 94 % (10/24 0800) FiO2 (%):  [24 %-30 %] 30 % (10/24 0646) HEMODYNAMICS:   VENTILATOR SETTINGS: Vent Mode:  [-] PRVC FiO2 (%):  [24 %-30 %] 30 % Set Rate:  [14 bmp] 14 bmp Vt Set:  [500 mL] 500 mL PEEP:  [5 cmH20] 5 cmH20 Plateau Pressure:  [15 cmH20] 15 cmH20 INTAKE / OUTPUT:  Intake/Output Summary (Last 24 hours) at 04/23/15 1059 Last data filed at 04/23/15 0600  Gross per 24 hour  Intake 2468.98 ml  Output   1150 ml  Net 1318.98 ml       PHYSICAL EXAM   Physical Exam  Constitutional: No distress.  HENT:  Head: Normocephalic.  Right Ear: External ear normal.  Left Ear: External ear normal.  Eyes: Pupils are equal, round, and reactive to light.  Neck: Neck supple.  Right sided neck swelling noted, area is soft  Cardiovascular: Normal rate.   Pulmonary/Chest: He has no rales.  Intubated. No resp distress.  Coarse upper airway sounds, dec BS Right upper anterior aspects, fine basilar crackles  Abdominal: Soft.  Musculoskeletal: He exhibits no edema.  Neurological:  Intubated and sedated  Skin: Skin is warm and dry.  Nursing note and vitals reviewed.  GCS<8T    LABS   LABS:  CBC  Recent Labs Lab 04/22/15 0527  WBC 11.7*  HGB 11.9*  HCT 36.8*  PLT 348   Coag's No results for input(s): APTT, INR in the last 168 hours. BMET  Recent Labs Lab 04/22/15 0527  NA 138  K 4.5  CL 103  CO2 19*  BUN 15  CREATININE 1.16  GLUCOSE 79   Electrolytes  Recent Labs Lab 04/22/15 0527  CALCIUM 8.7*   Sepsis Markers No results for input(s): LATICACIDVEN, PROCALCITON, O2SATVEN in the last 168 hours. ABG  Recent Labs Lab 04/22/15 0400 04/23/15 0437  PHART 7.26* 7.43  PCO2ART 38 35  PO2ART 178* 95   Liver Enzymes  Recent Labs Lab  04/22/15 0527  AST 17  ALT 11*  ALKPHOS 69  BILITOT 0.5  ALBUMIN 2.8*   Cardiac Enzymes No results for input(s): TROPONINI, PROBNP in the last 168 hours. Glucose  Recent Labs Lab 04/22/15 0800 04/22/15 0845  GLUCAP 86 93     Recent Results (from the past 240 hour(s))  MRSA PCR Screening     Status: Abnormal   Collection Time: 04/22/15  3:00 AM  Result Value Ref Range Status   MRSA by PCR POSITIVE (A) NEGATIVE Final    Comment:  The GeneXpert MRSA Assay (FDA approved for NASAL specimens only), is one component of a comprehensive MRSA colonization surveillance program. It is not intended to diagnose MRSA infection nor to guide or monitor treatment for MRSA infections. CRITICAL RESULT CALLED TO, READ BACK BY AND VERIFIED WITH: DANA BLAKENEY AT [id number] ON 04/22/15 BY [facility]      Current facility-administered medications:  .  0.9 %  sodium chloride infusion, , Intravenous, Continuous, Wyatt Hasteavid K Hower, MD, Last Rate: 100 mL/hr at 04/23/15 0340 .  acetaminophen (TYLENOL) tablet 650 mg, 650 mg, Oral, Q6H PRN, 650 mg at 04/22/15 2237 **OR** acetaminophen (TYLENOL) suppository 650 mg, 650 mg, Rectal, Q6H PRN, Wyatt Hasteavid K Hower, MD .  antiseptic oral rinse solution (CORINZ), 7 mL, Mouth Rinse, 10 times per day, Wyatt Hasteavid K Hower, MD, 7 mL at 04/23/15 0939 .  atropine 0.1 MG/ML injection, , , ,  .  budesonide (PULMICORT) nebulizer solution 0.5 mg, 0.5 mg, Nebulization, BID, Vishal Mungal, MD, 0.5 mg at 04/23/15 0922 .  chlorhexidine gluconate (PERIDEX) 0.12 % solution 15 mL, 15 mL, Mouth Rinse, BID, Wyatt Hasteavid K Hower, MD, 15 mL at 04/23/15 0758 .  Chlorhexidine Gluconate Cloth 2 % PADS 6 each, 6 each, Topical, Q0600, Stephanie AcreVishal Mungal, MD, 6 each at 04/23/15 0552 .  dexamethasone (DECADRON) injection 10 mg, 10 mg, Intravenous, 4 times per day, Linus Salmonshapman McQueen, MD, 10 mg at 04/23/15 0551 .  famotidine (PEPCID) IVPB 20 mg premix, 20 mg, Intravenous, Q12H, Vishal Mungal, MD, 20 mg at 04/23/15  0950 .  fentaNYL (SUBLIMAZE) injection 25 mcg, 25 mcg, Intravenous, Q5 min PRN, Yves DillPaul Carroll, MD, 25 mcg at 04/23/15 0109 .  fluticasone (FLONASE) 50 MCG/ACT nasal spray 1 spray, 1 spray, Each Nare, Daily, Wyatt Hasteavid K Hower, MD, 1 spray at 04/22/15 0912 .  gabapentin (NEURONTIN) capsule 1,200 mg, 1,200 mg, Oral, BID, Wyatt Hasteavid K Hower, MD, 1,200 mg at 04/23/15 [id number] .  heparin injection 5,000 Units, 5,000 Units, Subcutaneous, 3 times per day, Wyatt Hasteavid K Hower, MD, 5,000 Units at 04/23/15 [phone] [id number] .  HYDROmorphone (DILAUDID) 1 MG/ML injection, , , ,  .  ipratropium-albuterol (DUONEB) 0.5-2.5 (3) MG/3ML nebulizer solution 3 mL, 3 mL, Nebulization, Q6H, Vishal Mungal, MD, 3 mL at 04/23/15 0921 .  latanoprost (XALATAN) 0.005 % ophthalmic solution 1 drop, 1 drop, Both Eyes, QHS, Wyatt Hasteavid K Hower, MD, 1 drop at 04/22/15 2238 .  loratadine (CLARITIN) tablet 10 mg, 10 mg, Oral, Daily, Wyatt Hasteavid K Hower, MD, 10 mg at 04/23/15 [id number] .  morphine 2 MG/ML injection 2 mg, 2 mg, Intravenous, Q4H PRN, Wyatt Hasteavid K Hower, MD, 2 mg at 04/23/15 0334 .  mupirocin ointment (BACTROBAN) 2 % 1 application, 1 application, Nasal, BID, Stephanie AcreVishal Mungal, MD, 1 application at 04/23/15 0940 .  ondansetron (ZOFRAN) injection 4 mg, 4 mg, Intravenous, Once PRN, Yves DillPaul Carroll, MD .  ondansetron [facility](ZOFRAN) tablet 4 mg, 4 mg, Oral, Q6H PRN **OR** ondansetron (ZOFRAN) injection 4 mg, 4 mg, Intravenous, Q6H PRN, Wyatt Hasteavid K Hower, MD .  oxyCODONE (Oxy IR/ROXICODONE) immediate release tablet 5 mg, 5 mg, Oral, Q4H PRN, Wyatt Hasteavid K Hower, MD .  propofol (DIPRIVAN) 1000 MG/100ML infusion, 5-80 mcg/kg/min, Intravenous, Titrated, Wyatt Hasteavid K Hower, MD, Last Rate: 20.2 mL/hr at 04/23/15 0758, 45 mcg/kg/min at 04/23/15 0758 .  QUEtiapine (SEROQUEL XR) 24 hr tablet 200 mg, 200 mg, Oral, BID, Wyatt Hasteavid K Hower, MD, 200 mg at 04/23/15 [id number] .  sodium chloride 0.9 % injection 3 mL, 3 mL, Intravenous, Q12H, Wyatt Hasteavid K Hower, MD, 3 mL at  04/23/15 1000 .  traZODone (DESYREL) tablet 100 mg, 100 mg, Oral,  TID, Wyatt Haste, MD, 100 mg at 04/23/15 0932  IMAGING    No results found.    Indwelling Urinary Catheter continued, requirement due to   Reason to continue Indwelling Urinary Catheter for strict Intake/Output monitoring for hemodynamic instability         Ventilator continued, requirement due to, resp failure    Ventilator Sedation RASS 0 to -2   Lines:  Cultures: MRSA PCR 10/22>>positive Bld Cx x 2 10/23>> UA 10/23>>  Antibiotics: Unasyn 10/22>>   ASSESSMENT/PLAN   54 year old Caucasian gentleman history of hepatitis C recently diagnosed with necrotizing pneumonia right upper lobe now admitted and intubated for suspected angioedema with acute COCAINE use   PULMONARY-plan for trial of extubation in 24-48 HRS OETT 10/22 in the OR Respiratory failure Angioedema: Questionable etiology, examination findings by ENT consistent with angioedema.  He could have a  periodontal abscess given the state of his dentition. - Continue full vent support wean FiO2 and PEEP as tolerated check ABG as needed - Propofol for sedation - cont with Unasyn Right upper lobe necrotic lesion/Lung Abscess:  - cont with unasyn for now   CARDIOVASCULAR Cont with hemodynamic monitoring Monitor BP while on propofol  RENAL Monitor lytes - replace per ICU protocol  GASTROINTESTINAL A: Hx of diverticulosis (sigmoid) Hx of liver mass Chronic colovesical fistula Hx of Liver abcess - s/p drain in 2012 ? melena - cont with PPI - liver mass and fistula previous [facility] -start Tf's today  HEMATOLOGIC - no significant leukocytosis - check blood cultures given clinical decline  INFECTIOUS A: ?dental abscess RUL PNA Hx of drug/EtOH abuse P:   - cont with unasyn for now, may need to expand coverage pending CT chest and maxillofacial results - ECHO good EF 55-60%, no valvular abnormality noted  ENDOCRINE -ssi as needed  NEUROLOGIC Accidental  Fall Headache - chect Ct Head RASS goal: -1 while on vent     I have personally obtained a history, examined the patient, evaluated Pertinent laboratory and RadioGraphic/imaging results, and  formulated the assessment and plan   The Patient requires high complexity decision making for assessment and support, frequent evaluation and titration of therapies, application of advanced monitoring technologies and extensive interpretation of multiple databases. Critical Care Time devoted to patient care services described in this note is 40 minutes.   Overall, patient is critically ill, prognosis is guarded.  Patient with Multiorgan failure and at high risk for cardiac arrest and death.    Lucie Leather, M.D.  [facility]  Medical Director [facility] Medical Director [facility] Cardio-Pulmonary Department         04/23/2015, 10:59 AM

## 2015-04-23 NOTE — Progress Notes (Signed)
Patient self extubated around 1847. Dr. Nemiah CommanderKalisetti notified. Now on 2L [place] with strong voice and cough with O2 sats 100%.

## 2015-04-24 DIAGNOSIS — R0600 Dyspnea, unspecified: Secondary | ICD-10-CM

## 2015-04-24 DIAGNOSIS — T783XXD Angioneurotic edema, subsequent encounter: Secondary | ICD-10-CM

## 2015-04-24 LAB — BLOOD GAS, ARTERIAL
Acid-base deficit: 9.3 mmol/L — ABNORMAL HIGH (ref 0.0–2.0)
Bicarbonate: 17.1 mEq/L — ABNORMAL LOW (ref 21.0–28.0)
FIO2: 0.4
LHR: 14 {breaths}/min
Mechanical Rate: 14
O2 SAT: 99.4 %
PATIENT TEMPERATURE: 37
PEEP: 5 cmH2O
VT: 500 mL
pCO2 arterial: 38 mmHg (ref 32.0–48.0)
pH, Arterial: 7.26 — ABNORMAL LOW (ref 7.350–7.450)
pO2, Arterial: 178 mmHg — ABNORMAL HIGH (ref 83.0–108.0)

## 2015-04-24 LAB — CBC
HEMATOCRIT: 31.9 % — AB (ref 40.0–52.0)
HEMOGLOBIN: 10.2 g/dL — AB (ref 13.0–18.0)
MCH: 30.3 pg (ref 26.0–34.0)
MCHC: 32.1 g/dL (ref 32.0–36.0)
MCV: 94.6 fL (ref 80.0–100.0)
Platelets: 237 10*3/uL (ref 150–440)
RBC: 3.38 MIL/uL — ABNORMAL LOW (ref 4.40–5.90)
RDW: 16 % — ABNORMAL HIGH (ref 11.5–14.5)
WBC: 7 10*3/uL (ref 3.8–10.6)

## 2015-04-24 LAB — GLUCOSE, CAPILLARY
GLUCOSE-CAPILLARY: 108 mg/dL — AB (ref 65–99)
GLUCOSE-CAPILLARY: 112 mg/dL — AB (ref 65–99)
GLUCOSE-CAPILLARY: 135 mg/dL — AB (ref 65–99)
GLUCOSE-CAPILLARY: 179 mg/dL — AB (ref 65–99)
GLUCOSE-CAPILLARY: 212 mg/dL — AB (ref 65–99)
Glucose-Capillary: 194 mg/dL — ABNORMAL HIGH (ref 65–99)
Glucose-Capillary: 114 mg/dL — ABNORMAL HIGH (ref 65–99)

## 2015-04-24 LAB — BASIC METABOLIC PANEL
Anion gap: 5 (ref 5–15)
BUN: 16 mg/dL (ref 6–20)
CHLORIDE: 108 mmol/L (ref 101–111)
CO2: 26 mmol/L (ref 22–32)
CREATININE: 0.93 mg/dL (ref 0.61–1.24)
Calcium: 8.2 mg/dL — ABNORMAL LOW (ref 8.9–10.3)
GFR calc non Af Amer: >60 mL/min (ref >60)
Glucose, Bld: 166 mg/dL — ABNORMAL HIGH (ref 65–99)
Potassium: 3.8 mmol/L (ref 3.5–5.1)
Sodium: 139 mmol/L (ref 135–145)

## 2015-04-24 LAB — PHOSPHORUS: PHOSPHORUS: 1.4 mg/dL — AB (ref 2.5–4.6)

## 2015-04-24 LAB — MAGNESIUM: Magnesium: 2.3 mg/dL (ref 1.7–2.4)

## 2015-04-24 MED ORDER — DEXTROSE 5 % IV SOLN
30.0000 mmol | Freq: Once | INTRAVENOUS | Status: AC
  Administered 2015-04-24: 30 mmol via INTRAVENOUS
  Filled 2015-04-24: qty 10

## 2015-04-24 MED ORDER — FAMOTIDINE 20 MG PO TABS
20.0000 mg | ORAL_TABLET | Freq: Two times a day (BID) | ORAL | Status: DC
  Administered 2015-04-24 – 2015-04-25 (×2): 20 mg via ORAL
  Filled 2015-04-24 (×2): qty 1

## 2015-04-24 MED ORDER — IPRATROPIUM-ALBUTEROL 0.5-2.5 (3) MG/3ML IN SOLN
3.0000 mL | Freq: Three times a day (TID) | RESPIRATORY_TRACT | Status: DC
  Administered 2015-04-25: 3 mL via RESPIRATORY_TRACT
  Filled 2015-04-24: qty 3

## 2015-04-24 MED ORDER — METRONIDAZOLE IN NACL 5-0.79 MG/ML-% IV SOLN
500.0000 mg | Freq: Three times a day (TID) | INTRAVENOUS | Status: DC
Start: 2015-04-24 — End: 2015-04-25
  Administered 2015-04-24 – 2015-04-25 (×4): 500 mg via INTRAVENOUS
  Filled 2015-04-24 (×6): qty 100

## 2015-04-24 MED ORDER — INSULIN ASPART 100 UNIT/ML ~~LOC~~ SOLN: 0.0000 [IU] | Freq: Every day | SUBCUTANEOUS | Status: DC

## 2015-04-24 MED ORDER — DEXAMETHASONE SODIUM PHOSPHATE 10 MG/ML IJ SOLN
10.0000 mg | Freq: Two times a day (BID) | INTRAMUSCULAR | Status: DC
  Administered 2015-04-24 – 2015-04-25 (×2): 10 mg via INTRAVENOUS
  Filled 2015-04-24 (×2): qty 1

## 2015-04-24 MED ORDER — INSULIN ASPART 100 UNIT/ML ~~LOC~~ SOLN
0.0000 [IU] | Freq: Three times a day (TID) | SUBCUTANEOUS | Status: DC
  Administered 2015-04-24: 3 [IU] via SUBCUTANEOUS
  Filled 2015-04-24: qty 3

## 2015-04-24 MED ORDER — LEVOFLOXACIN IN D5W 750 MG/150ML IV SOLN
750.0000 mg | INTRAVENOUS | Status: AC
  Administered 2015-04-25: 750 mg via INTRAVENOUS
  Filled 2015-04-24: qty 150

## 2015-04-24 MED ORDER — LEVOFLOXACIN IN D5W 750 MG/150ML IV SOLN
750.0000 mg | Freq: Once | INTRAVENOUS | Status: AC
  Administered 2015-04-24: 750 mg via INTRAVENOUS
  Filled 2015-04-24: qty 150

## 2015-04-24 MED ORDER — ZOLPIDEM TARTRATE 5 MG PO TABS
10.0000 mg | ORAL_TABLET | Freq: Every evening | ORAL | Status: DC | PRN
  Administered 2015-04-24: 23:00:00 10 mg via ORAL
  Filled 2015-04-24: qty 2

## 2015-04-24 NOTE — Progress Notes (Signed)
Inpatient Diabetes Program Recommendations  AACE/ADA: New Consensus Statement on Inpatient Glycemic Control (2015)  Target Ranges:  Prepandial:   less than 140 mg/dL      Peak postprandial:   less than 180 mg/dL (1-2 hours)      Critically ill patients:  140 - 180 mg/dL  Results for Perry Bullock, Perry Bullock (MRN [id number]) as of 04/24/2015 12:15  Ref. Range 04/23/2015 21:28 04/24/2015 00:02 04/24/2015 04:16 04/24/2015 07:25 04/24/2015 11:37  Glucose-Capillary Latest Ref Range: 65-99 mg/dL 409114 (H) 811112 (H) 914194 (H) 135 (H) 212 (H)   Review of Glycemic Control  Diabetes history: NO Outpatient Diabetes medications: NA Current orders for Inpatient glycemic control: CBG Q4H  Inpatient Diabetes Program Recommendations: Correction (SSI): Patient is ordered Decadron 10 mg QID which is likely cause of hyperglycemia. Glucose noted to be 212 mg/dl at 78:2911:37 today. While inpatient and ordered steroids, please consider ordering Novolog correction scale ACHS.  Thanks, Orlando PennerMarie Rayshell Goecke, RN, MSN, CCRN, CDE Diabetes Coordinator Inpatient Diabetes Program [phone] (Team Pager from 8am to 5pm) [phone] ([facility] office) [phone] [facility] office) [phone] [facility] office)

## 2015-04-24 NOTE — Progress Notes (Signed)
Nutrition Follow-up    INTERVENTION:   Meals and Snacks: Cater to patient preferences   NUTRITION DIAGNOSIS:   Increased nutrient needs related to acute illness as evidenced by NPO status.  GOAL:   Provide needs based on ASPEN/SCCM guidelines   MONITOR:    (Energy intake, Electrolyte and renal profile, Digestive system)  REASON FOR ASSESSMENT:   Consult, Ventilator Enteral/tube feeding initiation and management  ASSESSMENT:    Pt s/p self-extubation, angioedema improved, lung abcess  Past Medical History  Diagnosis Date  . Bipolar 1 disorder (HCC)   . Chronic back pain     Diverticulosis  . Asthma   . COPD (chronic obstructive pulmonary disease) (HCC)   . Hepatitis   . GERD (gastroesophageal reflux disease)     Diet Order:  Diet regular Room service appropriate?: Yes; Fluid consistency:: Thin   Energy Intake: 100% at breakfast this AM, aet 2 sandwich trays during the night  Electrolyte and Renal Profile:  Recent Labs Lab 04/22/15 0527 04/24/15 0624  BUN 15 16  CREATININE 1.16 0.93  NA 138 139  K 4.5 3.8  MG  --  2.3  PHOS  --  1.4*   Glucose Profile:  Recent Labs  04/24/15 0416 04/24/15 0725 04/24/15 1137  GLUCAP 194* 135* 212*   Meds: decadron, ss novolog, potassium phosphate  Height:   Ht Readings from Last 1 Encounters:  04/22/15 6' (1.829 m)    Weight:   Wt Readings from Last 1 Encounters:  04/23/15 160 lb 15 oz (73 kg)    BMI:  Body mass index is 21.82 kg/(m^2).  Estimated Nutritional Needs:   Kcal:  TEE 1732 kcals (Ve 8.3, T max 36.9)  Protein:  (1.2-2.0 g/kg) 86-144 g/d  Fluid:  (25-4130ml/kg) 1800-212260ml/d  EDUCATION NEEDS:   No education needs identified at this time  MODERATE Care Level  Romelle Starcherate Ersilia Brawley MS, RD, LDN [phone] Pager

## 2015-04-24 NOTE — Progress Notes (Signed)
Chart reviewed Pt seems to have diverticulitis on CT scan abd pelvis. Not on unasyn at this point. - given concern for angioedema from augmentin. At this point would start levo and flagyl for the necrotizing pNA and diverticulitis- ordered I will see this PM

## 2015-04-24 NOTE — Consult Note (Signed)
[facility] Infectious Disease     Reason for Consult:PNA, Angioedema   Referring Physician: Bobetta Lime Date of Admission:  04/22/2015   Active Problems:   Angioedema   HPI: Perry Bullock is a 54 y.o. male with hx ETOH and substance abuse, Hep C, COPD admitted initially 10/4 with cough wt loss, wbc 30 K and CT evidence necrotizing RUL PNA. Had sputum at that time sent and had nml flora. AFB neg. Dced on augmentin and was improving. He was readmitted with sudden swelling of tongue and face. Per my discussion with his fiancee he had been given a lisinopril by a friend that day.   he had also started to drink can use drugs again.  His tox screen was positive for multiple substances on admission.  She both report his cough is improving.  He does  Have ahistory of chronic diverticulitis and diverticulosis this.  He was  Supposed to be undergoing surgery at some point but has not followed up.  He is not having any left lower quadrant abdominal pain  Past Medical History  Diagnosis Date  . Bipolar 1 disorder ([place])   . Chronic back pain     Diverticulosis  . Asthma   . COPD (chronic obstructive pulmonary disease) ([place])   . Hepatitis   . GERD (gastroesophageal reflux disease)    Past Surgical History  Procedure Laterality Date  . Eye surgery    . Mandible surgery    . Intubation-endotracheal with tracheostomy standby N/A 04/22/2015    Procedure: INTUBATION-ENDOTRACHEAL WITH TRACHEOSTOMY STANDBY;  Surgeon: Beverly Gust, MD;  Location: [facility] ORS;  Service: ENT;  Laterality: N/A;   Social History  Substance Use Topics  . Smoking status: Current Every Day Smoker    Types: Cigarettes  . Smokeless tobacco: None  . Alcohol Use: 3.6 oz/week    6 Cans of beer per week   Family History  Problem Relation Age of Onset  . Breast cancer Mother   . Hypertension Father   . Diabetes Father     Allergies: No Known Allergies  Current antibiotics: Antibiotics Given (last 72 hours)     Date/Time Action Medication Dose Rate   04/22/15 0331 Given   Ampicillin-Sulbactam (UNASYN) 3 g in sodium chloride 0.9 % 100 mL IVPB 3 g 100 mL/hr   04/22/15 0808 Given   Ampicillin-Sulbactam (UNASYN) 3 g in sodium chloride 0.9 % 100 mL IVPB 3 g 100 mL/hr   04/22/15 1442 Given   Ampicillin-Sulbactam (UNASYN) 3 g in sodium chloride 0.9 % 100 mL IVPB 3 g 100 mL/hr   04/22/15 2025 Given   Ampicillin-Sulbactam (UNASYN) 3 g in sodium chloride 0.9 % 100 mL IVPB 3 g 100 mL/hr   04/24/15 1054 Given   metroNIDAZOLE (FLAGYL) IVPB 500 mg 500 mg 100 mL/hr   04/24/15 1232 Given  [phArm delay]   levofloxacin (LEVAQUIN) IVPB 750 mg 750 mg 100 mL/hr      MEDICATIONS: . budesonide  0.5 mg Nebulization BID  . Chlorhexidine Gluconate Cloth  6 each Topical Q0600  . dexamethasone  10 mg Intravenous Q12H  . famotidine  20 mg Oral BID  . fluticasone  1 spray Each Nare Daily  . gabapentin  1,200 mg Oral BID  . heparin  5,000 Units Subcutaneous 3 times per day  . insulin aspart  0-15 Units Subcutaneous TID WC  . insulin aspart  0-5 Units Subcutaneous QHS  . ipratropium-albuterol  3 mL Nebulization Q6H  . latanoprost  1 drop  Both Eyes QHS  . [START ON 04/25/2015] levofloxacin (LEVAQUIN) IV  750 mg Intravenous Q24H  . loratadine  10 mg Oral Daily  . metronidazole  500 mg Intravenous Q8H  . mupirocin ointment  1 application Nasal BID  . potassium phosphate IVPB (mmol)  30 mmol Intravenous Once  . QUEtiapine  200 mg Oral BID  . sodium chloride  3 mL Intravenous Q12H  . traZODone  100 mg Oral TID    Review of Systems - 11 systems reviewed and negative per HPI   OBJECTIVE: Temp:  [97.1 F (36.2 C)-98.3 F (36.8 C)] 98.1 F (36.7 C) (10/25 1356) Pulse Rate:  [52-95] 80 (10/25 1356) Resp:  [13-21] 18 (10/25 1000) BP: (97-144)/(62-88) 118/69 mmHg (10/25 1356) SpO2:  [92 %-100 %] 99 % (10/25 1357) FiO2 (%):  [21 %-30 %] 21 % (10/25 1357) Weight:  [73 kg (160 lb 15 oz)] 73 kg (160 lb 15 oz) (10/24  2300) Physical Exam  Constitutional: He is oriented to person, place, and time. Thin dishelved HENT:  Mouth/Throat: Oropharynx is clear and dry. No oropharyngeal exudate.  Cardiovascular: Normal rate, regular rhythm and normal heart sounds.  Pulmonary/Chest: Effort normal bil rhonchi Abdominal: Soft. Bowel sounds are normal. He exhibits no distension. There is no tenderness.  Lymphadenopathy:  He has no cervical adenopathy.  Neurological: He is alert and oriented to person, place, and time.  Skin: Skin is warm and dry. No rash noted. No erythema.  Psychiatric: He has a normal mood and affect. His behavior is normal.   LABS: Results for orders placed or performed during the hospital encounter of 04/22/15 (from the past 48 hour(s))  Blood gas, arterial     Status: None   Collection Time: 04/23/15  4:37 AM  Result Value Ref [place]   FIO2 0.24    Mode PRESSURE REGULATED VOLUME CONTROL    VT 500 mL   LHR 14 resp/min   pH, Arterial 7.43 7.350 - 7.450   pCO2 arterial 35 32.0 - 48.0 mmHg   pO2, Arterial 95 83.0 - 108.0 mmHg   Bicarbonate 23.2 21.0 - 28.0 mEq/L   Acid-base deficit 0.6 0.0 - 2.0 mmol/L   O2 Saturation 97.6 %   Patient temperature 37.0    Collection site REVIEWED BY    Sample type ARTERIAL DRAW    Allens test (pass/fail) PASS PASS   Mechanical Rate 14   Glucose, capillary     Status: Abnormal   Collection Time: 04/23/15  9:28 PM  Result Value Ref [place]   Glucose-Capillary 114 (H) 65 - 99 mg/dL  Glucose, capillary     Status: Abnormal   Collection Time: 04/24/15 12:02 AM  Result Value Ref [place]   Glucose-Capillary 112 (H) 65 - 99 mg/dL  Glucose, capillary     Status: Abnormal   Collection Time: 04/24/15  4:16 AM  Result Value Ref Range   Glucose-Capillary 194 (H) 65 - 99 mg/dL  Magnesium     Status: None   Collection Time: 04/24/15  6:24 AM  Result Value Ref Range   Magnesium 2.3 1.7 - 2.4 mg/dL  Phosphorus     Status: Abnormal   Collection Time: 04/24/15  6:24  AM  Result Value Ref [place]   Phosphorus 1.4 (L) 2.5 - 4.6 mg/dL  CBC     Status: Abnormal   Collection Time: 04/24/15  6:24 AM  Result Value Ref [place]   WBC 7.0 3.8 - 10.6 K/uL   RBC 3.38 (L) 4.40 -  5.90 MIL/uL   Hemoglobin 10.2 (L) 13.0 - 18.0 g/dL   HCT 31.9 (L) 40.0 - 52.0 %   MCV 94.6 80.0 - 100.0 fL   MCH 30.3 26.0 - 34.0 pg   MCHC 32.1 32.0 - 36.0 g/dL   RDW 16.0 (H) 11.5 - 14.5 %   Platelets 237 150 - 440 K/uL  Basic metabolic panel     Status: Abnormal   Collection Time: 04/24/15  6:24 AM  Result Value Ref [place]   Sodium 139 135 - 145 mmol/L   Potassium 3.8 3.5 - 5.1 mmol/L   Chloride 108 101 - 111 mmol/L   CO2 26 22 - 32 mmol/L   Glucose, Bld 166 (H) 65 - 99 mg/dL   BUN 16 6 - 20 mg/dL   Creatinine, Ser 0.93 0.61 - 1.24 mg/dL   Calcium 8.2 (L) 8.9 - 10.3 mg/dL   GFR calc non Af Amer >60 >60 mL/min   GFR calc Af Amer >60 >60 mL/min    Comment: (NOTE) The eGFR has been calculated using the CKD EPI equation. This calculation has not been validated in all clinical situations. eGFR's persistently <60 mL/min signify possible Chronic Kidney Disease.    Anion gap 5 5 - 15  Glucose, capillary     Status: Abnormal   Collection Time: 04/24/15  7:25 AM  Result Value Ref [place]   Glucose-Capillary 135 (H) 65 - 99 mg/dL  Glucose, capillary     Status: Abnormal   Collection Time: 04/24/15 11:37 AM  Result Value Ref [place]   Glucose-Capillary 212 (H) 65 - 99 mg/dL   No components found for: ESR, C REACTIVE PROTEIN MICRO: Recent Results (from the past 720 hour(s))  Blood culture (routine x 2)     Status: None   Collection Time: 04/03/15  3:58 AM  Result Value Ref Range Status   Specimen Description BLOOD BLOOD RIGHT FOREARM  Final   Special Requests BOTTLES DRAWN AEROBIC AND ANAEROBIC 5ML  Final   Culture NO GROWTH 5 DAYS  Final   Report Status 04/08/2015 FINAL  Final  Blood culture (routine x 2)     Status: None   Collection Time: 04/03/15  3:58 AM  Result Value Ref  Range Status   Specimen Description BLOOD RIGHT ANTECUBITAL  Final   Special Requests BOTTLES DRAWN AEROBIC AND ANAEROBIC 5ML  Final   Culture NO GROWTH 5 DAYS  Final   Report Status 04/08/2015 FINAL  Final  Culture, expectorated sputum-assessment     Status: None   Collection Time: 04/03/15  5:17 AM  Result Value Ref [place] Status   Specimen Description SPUTUM  Final   Special Requests NONE  Final   Sputum evaluation THIS SPECIMEN IS ACCEPTABLE FOR SPUTUM CULTURE  Final   Report Status 04/03/2015 FINAL  Final  Culture, respiratory (NON-Expectorated)     Status: None   Collection Time: 04/03/15  5:17 AM  Result Value Ref [place] Status   Specimen Description SPUTUM  Final   Special Requests NONE Reflexed from [id number]  Final   Gram Stain   Final    FEW WBC SEEN FEW GRAM POSITIVE COCCI IN CHAINS GOOD SPECIMEN - 80-90% WBCS    Culture APPEARS TO BE NORMAL FLORA  Final   Report Status 04/05/2015 FINAL  Final  Culture, sputum-assessment     Status: None   Collection Time: 04/03/15 12:17 PM  Result Value Ref [place] Status   Specimen Description EXPECTORATED SPUTUM  Final   Special Requests  NONE  Final   Sputum evaluation THIS SPECIMEN IS ACCEPTABLE FOR SPUTUM CULTURE  Final   Report Status 04/03/2015 FINAL  Final  Culture, respiratory (NON-Expectorated)     Status: None   Collection Time: 04/03/15 12:17 PM  Result Value Ref [place] Status   Specimen Description EXPECTORATED SPUTUM  Final   Special Requests NONE Reflexed from [id number]  Final   Gram Stain   Final    GOOD SPECIMEN - 80-90% WBCS FEW WBC SEEN MODERATE GRAM POSITIVE COCCI IN CLUSTERS IN PAIRS MANY GRAM POSITIVE RODS    Culture APPEARS TO BE NORMAL FLORA  Final   Report Status 04/06/2015 FINAL  Final  Culture, expectorated sputum-assessment     Status: None   Collection Time: 04/04/15  1:38 PM  Result Value Ref [place] Status   Specimen Description EXPECTORATED SPUTUM  Final   Special Requests NONE  Final   Sputum evaluation  THIS SPECIMEN IS ACCEPTABLE FOR SPUTUM CULTURE  Final   Report Status 04/05/2015 FINAL  Final  Culture, respiratory (NON-Expectorated)     Status: None   Collection Time: 04/04/15  1:38 PM  Result Value Ref [place] Status   Specimen Description EXPECTORATED SPUTUM  Final   Special Requests NONE Reflexed from [id number]  Final   Gram Stain   Final    FEW WBC SEEN FEW GRAM POSITIVE COCCI IN CHAINS FEW YEAST GOOD SPECIMEN - 80-90% WBCS    Culture HEAVY GROWTH CANDIDA SPECIES, NOT ALBICANS  Final   Report Status 04/10/2015 FINAL  Final  MRSA PCR Screening     Status: Abnormal   Collection Time: 04/22/15  3:00 AM  Result Value Ref Range Status   MRSA by PCR POSITIVE (A) NEGATIVE Final    Comment:        The GeneXpert MRSA Assay (FDA approved for NASAL specimens only), is one component of a comprehensive MRSA colonization surveillance program. It is not intended to diagnose MRSA infection nor to guide or monitor treatment for MRSA infections. CRITICAL RESULT CALLED TO, READ BACK BY AND VERIFIED WITH: DANA BLAKENEY AT 2956 ON 04/22/15 BY [facility]   Culture, blood (routine x 2)     Status: None (Preliminary result)   Collection Time: 04/22/15  9:31 AM  Result Value Ref [place] Status   Specimen Description BLOOD RIGHT WRIST  Final   Special Requests BOTTLES DRAWN AEROBIC AND ANAEROBIC  4CC  Final   Culture NO GROWTH 2 DAYS  Final   Report Status PENDING  Incomplete  Culture, blood (routine x 2)     Status: None (Preliminary result)   Collection Time: 04/22/15 10:41 AM  Result Value Ref [place] Status   Specimen Description BLOOD RIGHT WRIST  Final   Special Requests BOTTLES DRAWN AEROBIC AND ANAEROBIC  [place]  Final   Culture NO GROWTH 2 DAYS  Final   Report Status PENDING  Incomplete    IMAGING: Dg Chest 2 View  04/03/2015  CLINICAL DATA:  Right upper chest pain, fever, productive cough. Duration 1 week. EXAM: CHEST  2 VIEW COMPARISON:  04/20/2013 FINDINGS: There is a right upper lobe  cavitary lesion with air-fluid level, measuring approximately 5 x 7 cm. This is new from 04/20/2013. This could represent a lung abscess. Superinfection of a pre-existing bulla would also be a possibility although no significant bullous disease is evident in this area on the prior study. A cavitary neoplasm is less likely. No pleural effusion is evident. Minimal scattered patchy airspace opacities are present elsewhere in  the lungs, probably infectious. There is mild anterior wedging of T12, unchanged. There is mild benign pleural thickening in the lateral left hemi thorax and multiple remote healed left rib fracture deformities, unchanged. IMPRESSION: Right upper lobe cavitary lesion with air-fluid level, suspicious for lung abscess. Recommend chest CT with contrast for characterization. Electronically Signed   By: Andreas Newport M.D.   On: 04/03/2015 03:49   Ct Head Wo Contrast  04/22/2015  CLINICAL DATA:  Respiratory failure, angioedema, intubation EXAM: CT HEAD WITHOUT CONTRAST TECHNIQUE: Contiguous axial images were obtained from the base of the skull through the vertex without intravenous contrast. COMPARISON:  None. FINDINGS: Generalized atrophy. Normal ventricular morphology. No midline shift or mass effect. Normal appearance of brain parenchyma. No intracranial hemorrhage, mass lesion or evidence acute infarction. No extra-axial fluid collections. Deviation of nose and nasal septum to the LEFT. Visualized paranasal sinuses and mastoid air cells clear. No acute osseous findings. IMPRESSION: Generalized atrophy. No acute intracranial abnormalities. Electronically Signed   By: Lavonia Dana M.D.   On: 04/22/2015 11:03   Ct Chest W Contrast  04/22/2015  CLINICAL DATA:  Allergic reaction, on antibiotics for right upper lobe cavitary lesion/necrotizing pneumonia EXAM: CT CHEST WITH CONTRAST TECHNIQUE: Multidetector CT imaging of the chest was performed during intravenous contrast administration.  CONTRAST:  16m OMNIPAQUE IOHEXOL 300 MG/ML  SOLN COMPARISON:  CT chest dated 04/03/2015 FINDINGS: Mediastinum/Nodes: The heart is normal in size. No pericardial effusion. Mild atherosclerotic calcifications of the aortic arch. Small mediastinal lymph nodes, including an 11 mm short axis high right paratracheal node near the thoracic inlet (series 2/ image 9). Visualized thyroid is unremarkable. Lungs/Pleura: Endotracheal tube terminates 4 cm above the carina. Improving thick-walled cavitary lesion in the right lung apex now measuring approximately 7.3 x 4.9 cm, previously 10.8 x 7.8 cm. Additional satellite nodularity in the right middle lobe and right lower lobe (series 3/ image 31), also improving. Given the interval change following antibiotic therapy, these findings are compatible with improving pneumonia. Small left pleural effusion, some of which is chronic with calcifications. Trace right pleural effusion. Underlying mild emphysematous changes with scattered areas of scarring. No pneumothorax. Upper abdomen: Visualized upper abdomen is notable for hepatic steatosis and an enteric tube that terminates in the proximal gastric body. Musculoskeletal: Degenerative changes of the visualized thoracolumbar spine. Mild superior endplate compression fracture deformity at T11, unchanged. IMPRESSION: Improving thick-walled cavitary lesion in the right lung apex, now measuring approximate 7.3 x 4.9 cm. Improving satellite nodularity in the right middle and lower lobes. Overall appearance is compatible with improving multifocal pneumonia. Electronically Signed   By: SJulian HyM.D.   On: 04/22/2015 11:17   Ct Chest W Contrast  04/03/2015  CLINICAL DATA:  Cough and right upper chest pain x 3 days. Known history of COPD, hepatitis C, bipolar disorder, history of alcohol use in the past presents to the emergency room with the complaints of cough with production of greenish yellow sputum for the past 3-4 days.  Admits having fever. No night sweats. No hemoptysis. No shortness of breath. No wheezing. Also complains of right-sided chest pain increased with respirations. Admits he has been having poor appetite with weight loss for the past few weeks. Felt dizzy and had a near-syncopal episode yesterday EXAM: CT CHEST WITH CONTRAST TECHNIQUE: Multidetector CT imaging of the chest was performed during intravenous contrast administration. CONTRAST:  740mOMNIPAQUE IOHEXOL 350 MG/ML SOLN COMPARISON:  Current chest radiograph which shows an air-fluid level and opacity in  the right upper lobe. FINDINGS: Thoracic inlet:. No mass or adenopathy. Visualized thyroid is unremarkable. Mediastinum and hila: There is mediastinal adenopathy. Largest node is a right peritracheal, azygos level node measuring 14 mm in short axis. Several other prominent lymph nodes are noted along the right peritracheal mediastinum. No discrete masses. No left hilar adenopathy. There is abnormal soft tissue along the right hilum consistent with ill-defined adenopathy. Heart is normal in size. Great vessels are normal in caliber. No coronary artery calcifications. Lungs and pleura: Large cavitary lesion lies in the left upper lobe extending from the superior margin of the right hilum to the right apex. It measures approximately 10.8 x 7.8 x 7.9 cm. Walls of the cavitary lesion are not well-defined and where seen appear irregular. There are bubbles of air within the fluid as well as an air-fluid level anteriorly. The apical segmental branch to the left upper lobe appears occluded. There is mild narrowing of the anterior segmental branch near its origin. Below this, extending from the inferior right hilum along the posterior inferior margin of the right upper lobe, is a focal low-density masslike opacity measuring 3.2 x 2.0 x 3.4 cm. A smaller area of similar density is seen peripheral to this which abuts the oblique and minor fissures and a lateral pleural  margin. There are other areas of reticular opacity and hazy ground-glass opacity most evident adjacent to the cavitary lesion and in the posterior right upper lobe. Ill-defined reticular nodular opacities, which have a tree-in-bud type appearance, lie in the inferior aspect of the right upper lobe. There are less prominent but similar appearing opacities in the posterior lateral right middle lobe. Focal spiculated type opacity is noted in the posterior right lower lobe measuring 17 mm in greatest dimension. In the left upper lobe there is a small irregular opacity which measures 15 mm in greatest transverse dimension. There is contiguous linear and reticular opacity extending to the pleural margin of the lateral left upper lobe near the apex. There are other areas of reticular opacity there consistent with scarring or minimal subsegmental atelectasis. Right lung is relatively hyperexpanded when compared to the left. Minimal pleural effusions. On the left, calcifications lie along the visceral and parietal pleura of the margins of the pleural effusion. Limited upper abdomen: Heterogeneous attenuation of the spleen likely DT differential arterial enhancement. No convincing mass. No liver mass or adrenal mass. Musculoskeletal: Mild compression deformity of T12, which appears chronic. Old left rib fractures and left scapular fractures. No osteoblastic or osteolytic lesions. IMPRESSION: 1. Large cavitary lesion in the right upper lobe measuring 10.8 cm in greatest dimension. There are other lung abnormalities as detailed above. Findings may reflect neoplastic disease. Infection, particularly atypical infection such as MAI or tuberculosis, is suspected. Findings could reflect a combination of both infection and neoplastic disease. There is associated right mediastinal and hilar adenopathy. 2. Small chronic pleural effusion on the left likely a remote empyema or hemothorax. Hemothorax supported by old left rib and left  scapular fractures. Electronically Signed   By: Lajean Manes M.D.   On: 04/03/2015 12:22   Ct Abdomen Pelvis W Contrast  04/23/2015  CLINICAL DATA:  54 year old male with a history of cavitary pneumonia status post anaphylactic reaction with angioedema. Patient is now intubated and in the intensive care unit. Prior history of diverticulosis and diverticulitis. EXAM: CT ABDOMEN AND PELVIS WITH CONTRAST TECHNIQUE: Multidetector CT imaging of the abdomen and pelvis was performed using the standard protocol following bolus administration of intravenous  contrast. CONTRAST:  165m OMNIPAQUE IOHEXOL 350 MG/ML SOLN COMPARISON:  Recent prior CT scan of the chest 04/22/2015; prior CT of the abdomen 10/11/2010 FINDINGS: Lower Chest: Incompletely imaged left-sided pleural thickening. No suspicious pulmonary nodule or mass. Visualized cardiac structures within normal limits for size. No pericardial effusion. A nasogastric tube passes through the esophagus en route to the stomach. Abdomen: A nasogastric tube terminates within the stomach. Otherwise, unremarkable CT appearance of the stomach and duodenum save for a parity ampullary duodenum diverticulum. Unremarkable CT appearance of the spleen, adrenal glands and pancreas. The liver is diffusely low in attenuation consistent with hepatic steatosis. There is some mild focal sparing around the gallbladder fossa. No focal lesion. Gallbladder is unremarkable. No intra or extrahepatic biliary ductal dilatation. Unremarkable appearance of the bilateral kidneys. No focal solid lesion, hydronephrosis or nephrolithiasis. Sigmoid colonic diverticulosis. Thickening of the sigmoid colon proximal to the rectosigmoid junction. There is a small 1.7 x 1.5 cm fluid and gas collection in the perisigmoid fat best seen on image 69 of series 2. This appears to communicate with 2 separate segments of the sigmoid colon concerning for safe colocolonic fistula. Additionally, there is a defined tract  of gas and soft tissue thickening extending from the sigmoid colon inferiorly to the posterior bladder dome concerning for a colovesicular fistula. This fistula passes adjacent to a loop of small bowel bed displays is secondary wall thickening. Normal appendix in the right lower quadrant. The terminal ileum is unremarkable. Pelvis: A Foley catheter is present within the largely decompressed bladder. Unremarkable prostate gland and seminal vesicles. Small free fluid in the perirectal fat as above. Bones/Soft Tissues: No acute fracture or aggressive appearing lytic or blastic osseous lesion. Multilevel degenerative disc disease most severe at L4-L5 and L5-S1. There is mild grade 1 anterolisthesis of L4 on L5 which is unchanged compared to 10/11/2010. There has been mild interval progression of the underlying degenerative disc disease. Stable small sclerotic foci in the superior endplate of TA12and inferior endplate of TI78 Vascular: Atherosclerotic vascular disease without significant stenosis or aneurysmal dilatation. IMPRESSION: 1. 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 2. Secondary reactive thickening/inflammation of affecting the wall of an adjacent loop of small bowel in the posterior aspect of the bladder. 3. Small free fluid in the right aspect of the pelvic cul-de-sac likely related to the process above. 4. Incompletely imaged left-sided pleural thickening. 5. Hepatic steatosis. 6. Multilevel degenerative disc disease most severe at L4-L5 and L5-S1. 7. Stable grade 1 anterolisthesis of L4 on L5 which appears to be degenerative in etiology. 8. Atherosclerotic vascular  calcifications. Electronically Signed   By: HJacqulynn CadetM.D.   On: 04/23/2015 11:01   Ct Maxillofacial W/cm  04/22/2015  CLINICAL DATA:  Intubation, respiratory failure, angioedema with swelling of the lips, tongue, mouth and epiglottis EXAM: CT MAXILLOFACIAL WITH CONTRAST TECHNIQUE: Multidetector CT imaging of the maxillofacial structures was performed with intravenous contrast. Multiplanar CT image reconstructions were also generated. A small metallic BB was placed on the right temple in order to reliably differentiate right from left. CONTRAST:  734mOMNIPAQUE IOHEXOL 300 MG/ML  SOLN IV COMPARISON:  None FINDINGS: Generalized cerebral atrophy. Visualized intracranial structures otherwise unremarkable. Orbital  soft tissue planes clear. Deviation of nasal septum and nasal bones to the LEFT. Endotracheal tube enters larynx. Nasogastric tube enters proximal esophagus. Symmetric appearance of parotid and submandibular glands bilaterally. Diffuse soft tissue edema identified at the parapharyngeal and laryngeal soft tissues as well as adjacent to the tongue muscles. No discrete loculated fluid collection is identified. Small amount of fluid within the oral cavity and dependently within nasopharynx. Prevertebral soft tissues normal thickness. Scattered subcutaneous edema as well as edema with at the lips and oral cavity. Vascular structures grossly patent. No definite mass or adenopathy. Paranasal sinuses, mastoid air cells and middle ear cavities clear. Bones demineralized without acute fracture. Probable fracture involving medial wall of the LEFT orbit. Visualized portion of cervical spine significant for degenerative disc and facet disease changes. Numerous dental caries without significant periodontal disease. IMPRESSION: Scattered soft tissue edema diffusely in the parapharyngeal space, tongue, perilingual soft tissues, oral soft tissues, lips, as well as at the soft palate consistent with history of  angioedema. No definite prevertebral fluid collection identified. No loculated fluid collection, adenopathy or mass identified. Electronically Signed   By: Lavonia Dana M.D.   On: 04/22/2015 11:15    Assessment:   HARLESS MOLINARI is a 54 y.o. male with hx ETOH and substance abuse, Hep C, COPD admitted initially 10/4 with cough wt loss, wbc 30 K and CT evidence necrotizing RUL PNA. Had sputum at that time sent and had nml flora. AFB neg. Dced on augmentin and was improving. He was readmitted with sudden swelling of tongue and face. Per my discussion with his fiancee he had been given a lisinopril by a friend that day.   he had also started to drink can use drugs again.  His tox screen was positive for multiple substances on admission.  PNA is improving clinically and radiographically.   Recommendations Angioedema likely from ACE I not augmentin Resume augmentin - cont another 3 weeks FU me in 3 weeks WIll need substance abuse and etoh counseling.  Thank you very much for allowing me to participate in the care of this patient. Please call with questions.   Cheral Marker. Ola Spurr, MD

## 2015-04-24 NOTE — Progress Notes (Signed)
Patient alert, on room air. No complaints of pain. Removed catheter at 11:30 has not voided yet. Tolerating diet. Replacing electrolytes. Report given to [place],

## 2015-04-24 NOTE — Progress Notes (Signed)
[facility] [place] [facility]   PATIENT NAME: Perry Bullock    MR#:  [id number]  DATE OF BIRTH:  1961/02/07  SUBJECTIVE:  CHIEF COMPLAINT:   Chief Complaint  Patient presents with  . Allergic Reaction   Pt. Self extubated himself yesterday.  Eating this a.m. Tongue less swollen and angioedema improving.      REVIEW OF SYSTEMS:   Review of Systems  Constitutional: Negative for fever and chills.  HENT: Negative for congestion and tinnitus.   Eyes: Negative for blurred vision and double vision.  Respiratory: Negative for cough, shortness of breath and wheezing.   Cardiovascular: Negative for chest pain, orthopnea and PND.  Gastrointestinal: Negative for nausea, vomiting, abdominal pain and diarrhea.  Genitourinary: Negative for dysuria and hematuria.  Neurological: Negative for dizziness, sensory change and focal weakness.  All other systems reviewed and are negative.   DRUG ALLERGIES:  No Known Allergies  VITALS:  Blood pressure 112/76, pulse 88, temperature 98.3 F (36.8 C), temperature source Oral, resp. rate 18, height 6' (1.829 m), weight 73 kg (160 lb 15 oz), SpO2 94 %.  PHYSICAL EXAMINATION:  GENERAL:  54 y.o.-year-old patient lying in the bed with no acute distress, sedated EYES: Pupils equal, round, reactive to light and accommodation. Conjunctiva clear.  HEENT: Head atraumatic, normocephalic.  No oropharyngeal erythema.   NECK:  Supple, no jugular venous distention. No thyroid enlargement, no tenderness. No JVD or swelling.  LUNGS: Clear to auscultation bilaterally, no rales or rhonchi and no wheezes.  CARDIOVASCULAR: S1, S2 normal. No murmurs, rubs, or gallops.  ABDOMEN: Soft, nontender, nondistended. Bowel sounds present. No organomegaly or mass.  EXTREMITIES: No pedal edema, cyanosis, or clubbing.  NEUROLOGIC: No focal motor or sensory deficits appreciated bilaterally. PSYCHIATRIC: Awake oriented 3. Good affect. SKIN: No obvious  rash, lesion, or ulcer.    LABORATORY PANEL:   CBC  Recent Labs Lab 04/24/15 0624  WBC 7.0  HGB 10.2*  HCT 31.9*  PLT 237   ------------------------------------------------------------------------------------------------------------------  Chemistries   Recent Labs Lab 04/22/15 0527 04/24/15 0624  NA 138 139  K 4.5 3.8  CL 103 108  CO2 19* 26  GLUCOSE 79 166*  BUN 15 16  CREATININE 1.16 0.93  CALCIUM 8.7* 8.2*  MG  --  2.3  AST 17  --   ALT 11*  --   ALKPHOS 69  --   BILITOT 0.5  --    ------------------------------------------------------------------------------------------------------------------  Cardiac Enzymes No results for input(s): TROPONINI in the last 168 hours. ------------------------------------------------------------------------------------------------------------------  RADIOLOGY:  Ct Abdomen Pelvis W Contrast  04/23/2015  CLINICAL DATA:  54 year old male with a history of cavitary pneumonia status post anaphylactic reaction with angioedema. Patient is now intubated and in the intensive care unit. Prior history of diverticulosis and diverticulitis. EXAM: CT ABDOMEN AND PELVIS WITH CONTRAST TECHNIQUE: Multidetector CT imaging of the abdomen and pelvis was performed using the standard protocol following bolus administration of intravenous contrast. CONTRAST:  100mL OMNIPAQUE IOHEXOL 350 MG/ML SOLN COMPARISON:  Recent prior CT scan of the chest 04/22/2015; prior CT of the abdomen 10/11/2010 FINDINGS: Lower Chest: Incompletely imaged left-sided pleural thickening. No suspicious pulmonary nodule or mass. Visualized cardiac structures within normal limits for size. No pericardial effusion. A nasogastric tube passes through the esophagus en route to the stomach. Abdomen: A nasogastric tube terminates within the stomach. Otherwise, unremarkable CT appearance of the stomach and duodenum save for a parity ampullary duodenum diverticulum. Unremarkable CT appearance  of the spleen,  adrenal glands and pancreas. The liver is diffusely low in attenuation consistent with hepatic steatosis. There is some mild focal sparing around the gallbladder fossa. No focal lesion. Gallbladder is unremarkable. No intra or extrahepatic biliary ductal dilatation. Unremarkable appearance of the bilateral kidneys. No focal solid lesion, hydronephrosis or nephrolithiasis. Sigmoid colonic diverticulosis. Thickening of the sigmoid colon proximal to the rectosigmoid junction. There is a small 1.7 x 1.5 cm fluid and gas collection in the perisigmoid fat best seen on image 69 of series 2. This appears to communicate with 2 separate segments of the sigmoid colon concerning for safe colocolonic fistula. 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 This fistula passes adjacent to a loop of small bowel bed displays is secondary wall thickening. Normal appendix in the right lower quadrant. The terminal ileum is unremarkable. Pelvis: A Foley catheter is present within the largely decompressed bladder. Unremarkable prostate gland and seminal vesicles. Small free fluid in the perirectal fat as above. Bones/Soft Tissues: No acute fracture or aggressive appearing lytic or blastic osseous lesion. Multilevel degenerative disc disease most severe at L4-L5 and L5-S1. There is mild grade 1 anterolisthesis of L4 on L5 which is unchanged compared to 10/11/2010. There has been mild interval progression of the underlying degenerative disc disease. Stable small sclerotic foci in the superior endplate of T10 and inferior endplate of T11. Vascular: Atherosclerotic vascular disease without significant stenosis or aneurysmal dilatation. IMPRESSION: 1. 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 2. Secondary reactive thickening/inflammation of affecting the wall of an adjacent loop of small bowel in the posterior aspect of the bladder. 3. Small free fluid in the right aspect of the pelvic cul-de-sac likely related to the process above. 4. Incompletely imaged left-sided pleural thickening. 5. Hepatic steatosis. 6. Multilevel degenerative disc disease most severe at L4-L5 and L5-S1. 7. Stable grade 1 anterolisthesis of L4 on L5 which appears to be degenerative in etiology. 8. Atherosclerotic vascular calcifications. Electronically Signed   By: Malachy Moan M.D.   On: 04/23/2015 11:01     ASSESSMENT AND PLAN:   1) angioedema requiring intubation and ventilation: - etiology unclear. CT with no abscess. Had been on augmentin for 2 weeks. Per HPI he did smoke cocaine on the night prior to admission and may have had an unknown exposure at that time - Patient self extubated himself yesterday evening. Shortness of breath, tongue swelling has improved. - continue dexamethasone but will taper, continue pepcid, claritin  2) Hx of RUL necrotizing pneumonia - CT chest on this admission showing improved in the right lung apex lesion.  Afebrile, hemodynamically stable. No secretions.  - Appreciate infectious disease input and continue Levaquin, Flagyl. - ECHO showing normal EF.    3) COPD: no exacerbation - continue duo nebs, Spiriva, dulera, decadron but will taper.   4) ETOH abuse - no signs of withdrawal  5) Diverticulosis w/ diverticulitis - this seems chronic for the patient as he has had previous CT abdomen pelvis with similar findings.  Currently afebrile, hemodynamically stable, no abdominal pain, N/V. - appreciate ID input and cont. Levaquin/Flagyl for  now.  - pt. Tolerating PO well.   6) Glaucoma - cont. Xalantan eye drops.   7) Chronic Pain - cont. Oxycodone.   CODE STATUS: full  TOTAL TIME TAKING CARE OF THIS PATIENT: 25 minutes.   POSSIBLE D/C IN 1-2 DAYS, DEPENDING ON CLINICAL CONDITION.   Houston Siren M.D on 04/24/2015 at 1:12 PM  Between 7am to 6pm - Pager - [phone]  After 6pm go to [email or web address] - password [email or web address] [facility]  [place]  [facility]  Office  [phone]  CC: Primary care physician; [facility]

## 2015-04-24 NOTE — Consult Note (Signed)
PULMONARY / CRITICAL CARE MEDICINE   Name: Perry CuminsGary L Archuletta MRN: [id number] DOB: 10-30-1970    ADMISSION DATE:  04/22/2015 CONSULTATION DATE: 04/22/15  REFERRING MD :  Dr. Clint GuyHower   CHIEF COMPLAINT:   Lip/tongue swelling, stridor  SUBJECTIVE  Patient self extubated on 10/24 PM, doing well this morning, talkative and eating.   SIGNIFICANT EVENTS  10/24>>self extubated, doing well.  10/22>>possible angioedema from ? Augmentin>>intubated in the OR by ENT 10/7>> CT Chest>>RUL abscess and PNA>>started on Augmentin    PAST MEDICAL HISTORY    :  Past Medical History  Diagnosis Date  . Bipolar 1 disorder (HCC)   . Chronic back pain     Diverticulosis  . Asthma   . COPD (chronic obstructive pulmonary disease) (HCC)   . Hepatitis   . GERD (gastroesophageal reflux disease)    Past Surgical History  Procedure Laterality Date  . Eye surgery    . Mandible surgery    . Intubation-endotracheal with tracheostomy standby N/A 04/22/2015    Procedure: INTUBATION-ENDOTRACHEAL WITH TRACHEOSTOMY STANDBY;  Surgeon: Linus Salmonshapman McQueen, MD;  Location: [facility] ORS;  Service: ENT;  Laterality: N/A;   Prior to Admission medications   Medication Sig Start Date End Date Taking? Authorizing Provider  albuterol (PROVENTIL HFA;VENTOLIN HFA) 108 (90 BASE) MCG/ACT inhaler Inhale 1 puff into the lungs every 6 (six) hours as needed for wheezing or shortness of breath.    Historical Provider, MD  albuterol (PROVENTIL) (2.5 MG/3ML) 0.083% nebulizer solution Take 3 mLs (2.5 mg total) by nebulization every 6 (six) hours as needed for wheezing or shortness of breath. 04/06/15   Enedina FinnerSona Patel, MD  amoxicillin-clavulanate (AUGMENTIN) 875-125 MG tablet Take 1 tablet by mouth every 12 (twelve) hours. 1 tab two times a day for 4 weeks 04/06/15   Enedina FinnerSona Patel, MD  cetirizine (ZYRTEC) 10 MG tablet Take 10 mg by mouth daily.    Historical Provider, MD  fluticasone (FLONASE) 50 MCG/ACT nasal spray Place 1 spray into both nostrils  daily.    Historical Provider, MD  gabapentin (NEURONTIN) 300 MG capsule Take 1,200 mg by mouth 2 (two) times daily.    Historical Provider, MD  guaiFENesin-dextromethorphan (ROBITUSSIN DM) 100-10 MG/5ML syrup Take 5 mLs by mouth every 6 (six) hours as needed for cough. 04/06/15   Enedina FinnerSona Patel, MD  latanoprost (XALATAN) 0.005 % ophthalmic solution Place 1 drop into both eyes at bedtime.    Historical Provider, MD  mometasone-formoterol (DULERA) 100-5 MCG/ACT AERO Inhale 2 puffs into the lungs 2 (two) times daily.    Historical Provider, MD  omeprazole (PRILOSEC) 20 MG capsule Take 1 capsule (20 mg total) by mouth daily. 02/15/15 02/15/16  Gayla DossEryka A Gayle, MD  ondansetron (ZOFRAN ODT) 4 MG disintegrating tablet Take 1 tablet (4 mg total) by mouth every 8 (eight) hours as needed for nausea or vomiting. 02/15/15   Gayla DossEryka A Gayle, MD  ondansetron (ZOFRAN) 4 MG tablet Take 1 tablet (4 mg total) by mouth every 8 (eight) hours as needed for nausea. 04/06/15   Enedina FinnerSona Patel, MD  oxyCODONE-acetaminophen (PERCOCET/ROXICET) 5-325 MG tablet Take 1 tablet by mouth every 6 (six) hours as needed for moderate pain. 04/06/15   Enedina FinnerSona Patel, MD  pantoprazole (PROTONIX) 40 MG tablet Take 40 mg by mouth daily.    Historical Provider, MD  QUEtiapine (SEROQUEL XR) 200 MG 24 hr tablet Take 200 mg by mouth 2 (two) times daily.    Historical Provider, MD  tiotropium (SPIRIVA) 18 MCG inhalation capsule Place 18 mcg  into inhaler and inhale daily.    Historical Provider, MD  traZODone (DESYREL) 100 MG tablet Take 100 mg by mouth 3 (three) times daily.    Historical Provider, MD  zolpidem (AMBIEN) 10 MG tablet Take 10 mg by mouth at bedtime as needed for sleep.    Historical Provider, MD   No Known Allergies   FAMILY HISTORY   Family History  Problem Relation Age of Onset  . Breast cancer Mother   . Hypertension Father   . Diabetes Father       SOCIAL HISTORY  Per chart review  reports that he has been smoking Cigarettes.  He  does not have any smokeless tobacco history on file. He reports that he drinks about 3.6 oz of alcohol per week. He reports that he does not use illicit drugs.  Review of Systems  Constitutional: Negative for fever and chills.  HENT: Negative for hearing loss.   Eyes: Positive for blurred vision.  Respiratory: Positive for cough, sputum production and shortness of breath.   Cardiovascular: Negative for chest pain, orthopnea and leg swelling.  Gastrointestinal: Negative for heartburn, nausea and vomiting.  Genitourinary: Negative for dysuria.  Musculoskeletal: Negative for myalgias.  Neurological: Negative for headaches.      VITAL SIGNS    Temp:  [97.1 F (36.2 C)-98.3 F (36.8 C)] 98.3 F (36.8 C) (10/25 0700) Pulse Rate:  [45-92] 92 (10/25 0100) Resp:  [12-21] 19 (10/25 0000) BP: (93-144)/(62-88) 97/62 mmHg (10/25 0100) SpO2:  [95 %-100 %] 96 % (10/25 0748) FiO2 (%):  [30 %] 30 % (10/24 1800) Weight:  [160 lb 15 oz (73 kg)] 160 lb 15 oz (73 kg) (10/24 2300) HEMODYNAMICS:   VENTILATOR SETTINGS: Vent Mode:  [-] PRVC FiO2 (%):  [30 %] 30 % Set Rate:  [14 bmp] 14 bmp Vt Set:  [500 mL] 500 mL PEEP:  [5 cmH20] 5 cmH20 INTAKE / OUTPUT:  Intake/Output Summary (Last 24 hours) at 04/24/15 0837 Last data filed at 04/24/15 0554  Gross per 24 hour  Intake 2630.76 ml  Output   2650 ml  Net -19.24 ml       PHYSICAL EXAM   Physical Exam  Constitutional: No distress.  HENT:  Head: Normocephalic.  Right Ear: External ear normal.  Left Ear: External ear normal.  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  Right sided neck swelling noted, area is soft  Cardiovascular: Normal rate.   Pulmonary/Chest: He has no rales.  No resp distress.  Coarse upper airway sounds, dec BS.  Abdominal: Soft.  Musculoskeletal: He exhibits no edema.  Neurological:  Intubated and sedated  Skin: Skin is warm and dry.  Nursing note and vitals reviewed.      LABS    LABS:  CBC  Recent Labs Lab 04/22/15 0527 04/24/15 0624  WBC 11.7* 7.0  HGB 11.9* 10.2*  HCT 36.8* 31.9*  PLT 348 237   Coag's No results for input(s): APTT, INR in the last 168 hours. BMET  Recent Labs Lab 04/22/15 0527 04/24/15 0624  NA 138 139  K 4.5 3.8  CL 103 108  CO2 19* 26  BUN 15 16  CREATININE 1.16 0.93  GLUCOSE 79 166*   Electrolytes  Recent Labs Lab 04/22/15 0527 04/24/15 0624  CALCIUM 8.7* 8.2*  MG  --  2.3  PHOS  --  1.4*   Sepsis Markers No results for input(s): LATICACIDVEN, PROCALCITON, O2SATVEN in the last 168 hours. ABG  Recent Labs Lab 04/22/15 0400 04/23/15 0437  PHART 7.26* 7.43  PCO2ART 38 35  PO2ART 178* 95   Liver Enzymes  Recent Labs Lab 04/22/15 0527  AST 17  ALT 11*  ALKPHOS 69  BILITOT 0.5  ALBUMIN 2.8*   Cardiac Enzymes No results for input(s): TROPONINI, PROBNP in the last 168 hours. Glucose  Recent Labs Lab 04/22/15 0800 04/22/15 0845 04/23/15 2128 04/24/15 0002 04/24/15 0416 04/24/15 0725  GLUCAP 86 93 114* 112* 194* 135*     Recent Results (from the past 240 hour(s))  MRSA PCR Screening     Status: Abnormal   Collection Time: 04/22/15  3:00 AM  Result Value Ref Range Status   MRSA by PCR POSITIVE (A) NEGATIVE Final    Comment:        The GeneXpert MRSA Assay (FDA approved for NASAL specimens only), is one component of a comprehensive MRSA colonization surveillance program. It is not intended to diagnose MRSA infection nor to guide or monitor treatment for MRSA infections. CRITICAL RESULT CALLED TO, READ BACK BY AND VERIFIED WITH: DANA BLAKENEY AT 4098 ON 04/22/15 BY [facility]      Current facility-administered medications:  .  0.9 %  sodium chloride infusion, , Intravenous, Continuous, Wyatt Haste, MD, Last Rate: 100 mL/hr at 04/24/15 0204 .  acetaminophen (TYLENOL) tablet 650 mg, 650 mg, Oral, Q6H PRN, 650 mg at 04/22/15 2237 **OR** acetaminophen (TYLENOL) suppository 650 mg, 650  mg, Rectal, Q6H PRN, Wyatt Haste, MD .  budesonide (PULMICORT) nebulizer solution 0.5 mg, 0.5 mg, Nebulization, BID, Talesha Ellithorpe, MD, 0.5 mg at 04/24/15 0748 .  Chlorhexidine Gluconate Cloth 2 % PADS 6 each, 6 each, Topical, Q0600, Stephanie Acre, MD, 6 each at 04/24/15 0539 .  dexamethasone (DECADRON) injection 10 mg, 10 mg, Intravenous, 4 times per day, Linus Salmons, MD, 10 mg at 04/24/15 0644 .  famotidine (PEPCID) IVPB 20 mg premix, 20 mg, Intravenous, Q12H, Deanne Bedgood, MD, 20 mg at 04/23/15 2156 .  fluticasone (FLONASE) 50 MCG/ACT nasal spray 1 spray, 1 spray, Each Nare, Daily, Wyatt Haste, MD, 1 spray at 04/22/15 0912 .  gabapentin (NEURONTIN) capsule 1,200 mg, 1,200 mg, Oral, BID, Wyatt Haste, MD, 1,200 mg at 04/23/15 2148 .  heparin injection 5,000 Units, 5,000 Units, Subcutaneous, 3 times per day, Wyatt Haste, MD, 5,000 Units at 04/24/15 [phone] .  ipratropium-albuterol (DUONEB) 0.5-2.5 (3) MG/3ML nebulizer solution 3 mL, 3 mL, Nebulization, Q6H, Sheyann Sulton, MD, 3 mL at 04/24/15 0748 .  latanoprost (XALATAN) 0.005 % ophthalmic solution 1 drop, 1 drop, Both Eyes, QHS, Wyatt Haste, MD, 1 drop at 04/23/15 2149 .  loratadine (CLARITIN) tablet 10 mg, 10 mg, Oral, Daily, Wyatt Haste, MD, 10 mg at 04/23/15 4782 .  morphine 2 MG/ML injection 2 mg, 2 mg, Intravenous, Q4H PRN, Wyatt Haste, MD, 2 mg at 04/23/15 1745 .  mupirocin ointment (BACTROBAN) 2 % 1 application, 1 application, Nasal, BID, Stephanie Acre, MD, 1 application at 04/23/15 2149 .  ondansetron (ZOFRAN) injection 4 mg, 4 mg, Intravenous, Once PRN, Yves Dill, MD .  ondansetron [facility]) tablet 4 mg, 4 mg, Oral, Q6H PRN **OR** ondansetron (ZOFRAN) injection 4 mg, 4 mg, Intravenous, Q6H PRN, Wyatt Haste, MD .  oxyCODONE (Oxy IR/ROXICODONE) immediate release tablet 5 mg, 5 mg, Oral, Q4H PRN, Wyatt Haste, MD .  QUEtiapine (SEROQUEL XR) 24 hr tablet 200 mg, 200 mg, Oral, BID, Wyatt Haste, MD, 200 mg at  04/23/15  2148 .  sodium chloride 0.9 % injection 3 mL, 3 mL, Intravenous, Q12H, Wyatt Haste, MD, 3 mL at 04/23/15 2150 .  traZODone (DESYREL) tablet 100 mg, 100 mg, Oral, TID, Wyatt Haste, MD, 100 mg at 04/23/15 2148  IMAGING    Ct Abdomen Pelvis W Contrast  04/23/2015  CLINICAL DATA:  54 year old male with a history of cavitary pneumonia status post anaphylactic reaction with angioedema. Patient is now intubated and in the intensive care unit. Prior history of diverticulosis and diverticulitis. EXAM: CT ABDOMEN AND PELVIS WITH CONTRAST TECHNIQUE: Multidetector CT imaging of the abdomen and pelvis was performed using the standard protocol following bolus administration of intravenous contrast. CONTRAST:  OMNIPAQUE IOHEXOL 350 MG/ML SOLN COMPARISON:  Recent prior CT scan of the chest 04/22/2015; prior CT of the abdomen 10/11/2010 FINDINGS: Lower Chest: Incompletely imaged left-sided pleural thickening. No suspicious pulmonary nodule or mass. Visualized cardiac structures within normal limits for size. No pericardial effusion. A nasogastric tube passes through the esophagus en route to the stomach. Abdomen: A nasogastric tube terminates within the stomach. Otherwise, unremarkable CT appearance of the stomach and duodenum save for a parity ampullary duodenum diverticulum. Unremarkable CT appearance of the spleen, adrenal glands and pancreas. The liver is diffusely low in attenuation consistent with hepatic steatosis. There is some mild focal sparing around the gallbladder fossa. No focal lesion. Gallbladder is unremarkable. No intra or extrahepatic biliary ductal dilatation. Unremarkable appearance of the bilateral kidneys. No focal solid lesion, hydronephrosis or nephrolithiasis. Sigmoid colonic diverticulosis. Thickening of the sigmoid colon proximal to the rectosigmoid junction. There is a small 1.7 x 1.5 cm fluid and gas collection in the perisigmoid fat best seen on image 69 of series 2. This appears to  communicate with 2 separate segments of the sigmoid colon concerning for safe colocolonic fistula. 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 This fistula passes adjacent to a loop of small bowel bed displays is secondary wall thickening. Normal appendix in the right lower quadrant. The terminal ileum is unremarkable. Pelvis: A Foley catheter is present within the largely decompressed bladder. Unremarkable prostate gland and seminal vesicles. Small free fluid in the perirectal fat as above. Bones/Soft Tissues: No acute fracture or aggressive appearing lytic or blastic osseous lesion. Multilevel degenerative disc disease most severe at L4-L5 and L5-S1. There is mild grade 1 anterolisthesis of L4 on L5 which is unchanged compared to 10/11/2010. There has been mild interval progression of the underlying degenerative disc disease. Stable small sclerotic foci in the superior endplate of T10 and inferior endplate of T11. Vascular: Atherosclerotic vascular disease without significant stenosis or aneurysmal dilatation. IMPRESSION: 1. 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 2. Secondary reactive thickening/inflammation of affecting the wall of an adjacent loop of small bowel in the posterior aspect of the bladder. 3. Small free fluid in the right aspect of the pelvic cul-de-sac likely related to the process above. 4. Incompletely imaged left-sided pleural thickening. 5. Hepatic steatosis. 6. Multilevel degenerative disc disease most severe at L4-L5  and L5-S1. 7. Stable grade 1 anterolisthesis of  L4 on L5 which appears to be degenerative in etiology. 8. Atherosclerotic vascular calcifications. Electronically Signed   By: Malachy Moan M.D.   On: 04/23/2015 11:01      Indwelling Urinary Catheter continued, requirement due to   Reason to continue Indwelling Urinary Catheter for strict Intake/Output monitoring for hemodynamic instability         Ventilator continued, requirement due to, resp failure    Ventilator Sedation RASS 0 to -2   Lines:  Cultures: MRSA PCR 10/22>>positive Bld Cx x 2 10/23>>NG x 2 days UA 10/23>>tr leuks  Antibiotics: Unasyn 10/22>>10/23   ASSESSMENT/PLAN   54 year old Caucasian gentleman history of hepatitis C recently diagnosed with necrotizing pneumonia right upper lobe now admitted and intubated for suspected angioedema with acute COCAINE use   PULMONARY-plan for trial of extubation in 24-48 HRS OETT 10/22 in the OR Respiratory failure - resolved Angioedema: Questionable etiology, examination findings by ENT consistent with angioedema.  He could have a  periodontal abscess given the state of his dentition. - self extubated on 10/24 - doing well today, can be transferred to floor Right upper lobe necrotic lesion/Lung Abscess: currently on IV abx, will plan to transition to PO (clindamycin 300mg  PO QID x 14 days) - f/u with Dr. Bard Herbert on 05/01/15  CARDIOVASCULAR Cont with hemodynamic monitoring   RENAL Monitor lytes - replace per ICU protocol  GASTROINTESTINAL A: Hx of diverticulosis (sigmoid) Hx of liver mass Chronic colovesical fistula Hx of Liver abcess - s/p drain in 2012 ? melena - cont with PPI - liver mass and fistula previous [facility] then [facility]   HEMATOLOGIC - no significant leukocytosis   INFECTIOUS A: ?dental abscess RUL PNA Hx of drug/EtOH abuse P:   - no dental abscess noted on CT, only soft tissue swelling.  - ECHO good EF 55-60%, no  valvular abnormality noted  ENDOCRINE -ssi as needed  NEUROLOGIC Accidental Fall Headache - CT head neg, now with appropriate neurological response.       I have personally obtained a history, examined the patient, evaluated Pertinent laboratory and RadioGraphic/imaging results, and  formulated the assessment and plan   The Patient requires high complexity decision making for assessment and support, frequent evaluation and titration of therapies, application of advanced monitoring technologies and extensive interpretation of multiple databases.  Pulmonary consult time - 35 mins  Stephanie Acre, MD [facility] Pager [phone] (please enter 7-digits) On Call Pager - [phone] (please enter 7-digits)        04/24/2015, 8:37 AM

## 2015-04-24 NOTE — Progress Notes (Signed)
Patient passed bedside swallow exam with ice and thin water. Diet ordered per Dr. Sheryle Hailiamond

## 2015-04-24 NOTE — Progress Notes (Signed)
ANTIBIOTIC CONSULT NOTE - INITIAL  Pharmacy Consult for Levaquin Indication: pneumonia  No Known Allergies  Patient Measurements: Height: 6' (182.9 cm) Weight: 160 lb 15 oz (73 kg) IBW/kg (Calculated) : 77.6  Vital Signs: Temp: 98.3 F (36.8 C) (10/25 0700) Temp Source: Oral (10/25 0000) BP: 112/76 mmHg (10/25 1000) Pulse Rate: 88 (10/25 1000) Intake/Output from previous day: 10/24 0701 - 10/25 0700 In: 2630.8 [I.V.:2452.9; NG/GT:77.8; IV Piggyback:100] Out: 2650 [Urine:2650] Intake/Output from this shift: Total I/O In: 700 [P.O.:700] Out: -   Labs:  Recent Labs  04/22/15 0527 04/24/15 0624  WBC 11.7* 7.0  HGB 11.9* 10.2*  PLT 348 237  CREATININE 1.16 0.93   Estimated Creatinine Clearance: 93.8 mL/min (by C-G formula based on Cr of 0.93). No results for input(s): VANCOTROUGH, VANCOPEAK, VANCORANDOM, GENTTROUGH, GENTPEAK, GENTRANDOM, TOBRATROUGH, TOBRAPEAK, TOBRARND, AMIKACINPEAK, AMIKACINTROU, AMIKACIN in the last 72 hours.   Microbiology: Recent Results (from the past 720 hour(s))  Blood culture (routine x 2)     Status: None   Collection Time: 04/03/15  3:58 AM  Result Value Ref Range Status   Specimen Description BLOOD BLOOD RIGHT FOREARM  Final   Special Requests BOTTLES DRAWN AEROBIC AND ANAEROBIC 5ML  Final   Culture NO GROWTH 5 DAYS  Final   Report Status 04/08/2015 FINAL  Final  Blood culture (routine x 2)     Status: None   Collection Time: 04/03/15  3:58 AM  Result Value Ref Range Status   Specimen Description BLOOD RIGHT ANTECUBITAL  Final   Special Requests BOTTLES DRAWN AEROBIC AND ANAEROBIC 5ML  Final   Culture NO GROWTH 5 DAYS  Final   Report Status 04/08/2015 FINAL  Final  Culture, expectorated sputum-assessment     Status: None   Collection Time: 04/03/15  5:17 AM  Result Value Ref Range Status   Specimen Description SPUTUM  Final   Special Requests NONE  Final   Sputum evaluation THIS SPECIMEN IS ACCEPTABLE FOR SPUTUM CULTURE  Final   Report Status 04/03/2015 FINAL  Final  Culture, respiratory (NON-Expectorated)     Status: None   Collection Time: 04/03/15  5:17 AM  Result Value Ref Range Status   Specimen Description SPUTUM  Final   Special Requests NONE Reflexed from [id number]  Final   Gram Stain   Final    FEW WBC SEEN FEW GRAM POSITIVE COCCI IN CHAINS GOOD SPECIMEN - 80-90% WBCS    Culture APPEARS TO BE NORMAL FLORA  Final   Report Status 04/05/2015 FINAL  Final  Culture, sputum-assessment     Status: None   Collection Time: 04/03/15 12:17 PM  Result Value Ref Range Status   Specimen Description EXPECTORATED SPUTUM  Final   Special Requests NONE  Final   Sputum evaluation THIS SPECIMEN IS ACCEPTABLE FOR SPUTUM CULTURE  Final   Report Status 04/03/2015 FINAL  Final  Culture, respiratory (NON-Expectorated)     Status: None   Collection Time: 04/03/15 12:17 PM  Result Value Ref Range Status   Specimen Description EXPECTORATED SPUTUM  Final   Special Requests NONE Reflexed from [id number]  Final   Gram Stain   Final    GOOD SPECIMEN - 80-90% WBCS FEW WBC SEEN MODERATE GRAM POSITIVE COCCI IN CLUSTERS IN PAIRS MANY GRAM POSITIVE RODS    Culture APPEARS TO BE NORMAL FLORA  Final   Report Status 04/06/2015 FINAL  Final  Culture, expectorated sputum-assessment     Status: None   Collection Time: 04/04/15  1:38 PM  Result Value Ref Range Status   Specimen Description EXPECTORATED SPUTUM  Final   Special Requests NONE  Final   Sputum evaluation THIS SPECIMEN IS ACCEPTABLE FOR SPUTUM CULTURE  Final   Report Status 04/05/2015 FINAL  Final  Culture, respiratory (NON-Expectorated)     Status: None   Collection Time: 04/04/15  1:38 PM  Result Value Ref Range Status   Specimen Description EXPECTORATED SPUTUM  Final   Special Requests NONE Reflexed from [id number]  Final   Gram Stain   Final    FEW WBC SEEN FEW GRAM POSITIVE COCCI IN CHAINS FEW YEAST GOOD SPECIMEN - 80-90% WBCS    Culture HEAVY GROWTH CANDIDA SPECIES,  NOT ALBICANS  Final   Report Status 04/10/2015 FINAL  Final  MRSA PCR Screening     Status: Abnormal   Collection Time: 04/22/15  3:00 AM  Result Value Ref Range Status   MRSA by PCR POSITIVE (A) NEGATIVE Final    Comment:        The GeneXpert MRSA Assay (FDA approved for NASAL specimens only), is one component of a comprehensive MRSA colonization surveillance program. It is not intended to diagnose MRSA infection nor to guide or monitor treatment for MRSA infections. CRITICAL RESULT CALLED TO, READ BACK BY AND VERIFIED WITH: DANA BLAKENEY AT 9147 ON 04/22/15 BY [facility]   Culture, blood (routine x 2)     Status: None (Preliminary result)   Collection Time: 04/22/15  9:31 AM  Result Value Ref Range Status   Specimen Description BLOOD RIGHT WRIST  Final   Special Requests BOTTLES DRAWN AEROBIC AND ANAEROBIC  4CC  Final   Culture NO GROWTH 2 DAYS  Final   Report Status PENDING  Incomplete  Culture, blood (routine x 2)     Status: None (Preliminary result)   Collection Time: 04/22/15 10:41 AM  Result Value Ref Range Status   Specimen Description BLOOD RIGHT WRIST  Final   Special Requests BOTTLES DRAWN AEROBIC AND ANAEROBIC  17CC  Final   Culture NO GROWTH 2 DAYS  Final   Report Status PENDING  Incomplete    Medical History: Past Medical History  Diagnosis Date  . Bipolar 1 disorder (HCC)   . Chronic back pain     Diverticulosis  . Asthma   . COPD (chronic obstructive pulmonary disease) (HCC)   . Hepatitis   . GERD (gastroesophageal reflux disease)     Medications:  Scheduled:  . budesonide  0.5 mg Nebulization BID  . Chlorhexidine Gluconate Cloth  6 each Topical Q0600  . dexamethasone  10 mg Intravenous 4 times per day  . famotidine (PEPCID) IV  20 mg Intravenous Q12H  . fluticasone  1 spray Each Nare Daily  . gabapentin  1,200 mg Oral BID  . heparin  5,000 Units Subcutaneous 3 times per day  . ipratropium-albuterol  3 mL Nebulization Q6H  . latanoprost  1 drop Both  Eyes QHS  . levofloxacin (LEVAQUIN) IV  750 mg Intravenous Once  . [START ON 04/25/2015] levofloxacin (LEVAQUIN) IV  750 mg Intravenous Q24H  . loratadine  10 mg Oral Daily  . metronidazole  500 mg Intravenous Q8H  . mupirocin ointment  1 application Nasal BID  . QUEtiapine  200 mg Oral BID  . sodium chloride  3 mL Intravenous Q12H  . traZODone  100 mg Oral TID   Infusions:  . sodium chloride 100 mL/hr at 04/24/15 0204   PRN: acetaminophen **OR** acetaminophen, morphine injection, ondansetron (ZOFRAN) IV,  ondansetron **OR** ondansetron (ZOFRAN) IV, oxyCODONE  Assessment: 54 y/o M with necrotizing PNA and diverticulitis with recent angioedema presumably from Augmentin.   Goal of Therapy:  Resolution of Infection.   Plan:  Patient ordered metronidazole 500 mg iv q 8 hours. Will add Levaquin 750 mg iv q 24 hours. Will continue to follow renal function and culture results.   Luisa Hart D 04/24/2015,10:46 AM

## 2015-04-24 NOTE — Progress Notes (Signed)
Key Points: Use following P&T approved IV to PO antibiotic change policy.  Description contains the criteria that are approved Note: Policy Excludes:  Esophagectomy patientsPHARMACIST - PHYSICIAN COMMUNICATION DR:   Cherlynn KaiserSainani CONCERNING: IV to Oral Route Change Policy  RECOMMENDATION: This patient is receiving famotidine by the intravenous route.  Based on criteria approved by the Pharmacy and Therapeutics Committee, the intravenous medication(s) is/are being converted to the equivalent oral dose form(s).   DESCRIPTION: These criteria include:  The patient is eating (either orally or via tube) and/or has been taking other orally administered medications for a least 24 hours  The patient has no evidence of active gastrointestinal bleeding or impaired GI absorption (gastrectomy, short bowel, patient on TNA or NPO).  If you have questions about this conversion, please contact the Pharmacy Department  []   [phone]( [phone] )  [name] [x]   [phone]( [phone] )  [facility] []   [phone] )  [facility] []   [phone] )  [facility] []   [phone] )  [facility]   Valentina GuChristy, Adams Hinch D, [facility]PH 04/24/2015 10:55 AM

## 2015-04-24 NOTE — Progress Notes (Signed)
patient request to not wake, BS clear/diminished, no SOB

## 2015-04-25 DIAGNOSIS — J852 Abscess of lung without pneumonia: Secondary | ICD-10-CM

## 2015-04-25 LAB — GLUCOSE, CAPILLARY
GLUCOSE-CAPILLARY: 100 mg/dL — AB (ref 65–99)
GLUCOSE-CAPILLARY: 114 mg/dL — AB (ref 65–99)
Glucose-Capillary: 126 mg/dL — ABNORMAL HIGH (ref 65–99)
Glucose-Capillary: 133 mg/dL — ABNORMAL HIGH (ref 65–99)

## 2015-04-25 MED ORDER — METRONIDAZOLE 500 MG PO TABS: 500.0000 mg | ORAL_TABLET | Freq: Three times a day (TID) | ORAL | Status: DC

## 2015-04-25 MED ORDER — LEVOFLOXACIN 750 MG PO TABS: 750.0000 mg | ORAL_TABLET | Freq: Every day | ORAL | Status: DC

## 2015-04-25 MED ORDER — PREDNISONE 10 MG PO TABS
ORAL_TABLET | ORAL | Status: DC
Start: 2015-04-25 — End: 2015-05-01

## 2015-04-25 NOTE — Plan of Care (Signed)
Problem: Discharge Progression Outcomes Goal: Other Discharge Outcomes/Goals Outcome: Progressing Plan of care, progression of goals. 1. No c/o dyspnea, no O2 2. Independent with ADLs 3. No barriers to discharge. 4. Pain controlled 5. Tolerating diet. 6. Hemodynamically stable. 7. Anticipate discharge home with family.

## 2015-04-25 NOTE — Progress Notes (Signed)
PHARMACIST - PHYSICIAN COMMUNICATION DR:   [name] CONCERNING: Antibiotic IV to Oral Route Change Policy  RECOMMENDATION: This patient is receiving Metronidazole  by the intravenous route.  Based on criteria approved by the Pharmacy and Therapeutics Committee, the antibiotic(s) is/are being converted to the equivalent oral dose form(s).   DESCRIPTION: These criteria include:  Patient being treated for a respiratory tract infection, urinary tract infection, cellulitis or clostridium difficile associated diarrhea if on metronidazole  The patient is not neutropenic and does not exhibit a GI malabsorption state  The patient is eating (either orally or via tube) and/or has been taking other orally administered medications for a least 24 hours  The patient is improving clinically and has a Tmax < 100.5  If you have questions about this conversion, please contact the Pharmacy Department  []   [phone]( [phone] )  [name] [x]   [phone]( [phone] )  [facility] []   [phone]( [phone] )  [facility] []   [phone] )  [facility] []   [phone]( [phone] )  [facility]    Demetrius Charityeldrin D. Lucianne Smestad, PharmD, BCPS

## 2015-04-25 NOTE — Progress Notes (Signed)
Discharge instructions given to patient with prescription for prednisone-waiting on family for discharge ride home

## 2015-04-25 NOTE — Discharge Summary (Signed)
[facility] [place] [facility]   PATIENT NAME: Perry BarbaraGary Harari    MR#:  [id number]  DATE OF BIRTH:  Jul 23, 1960  DATE OF ADMISSION:  04/22/2015 ADMITTING PHYSICIAN: Linus Salmonshapman McQueen, MD  DATE OF DISCHARGE: 04/25/2015  1:41 PM  PRIMARY CARE PHYSICIAN: [facility]    ADMISSION DIAGNOSIS:  Angioedema, initial encounter [T78.3XXA]  DISCHARGE DIAGNOSIS:  Active Problems:   Angioedema   SECONDARY DIAGNOSIS:   Past Medical History  Diagnosis Date  . Bipolar 1 disorder (HCC)   . Chronic back pain     Diverticulosis  . Asthma   . COPD (chronic obstructive pulmonary disease) (HCC)   . Hepatitis   . GERD (gastroesophageal reflux disease)     HOSPITAL COURSE:   54 year old male with past medical history of bipolar disorder, chronic back pain, asthma/COPD, GERD, glaucoma who came to the hospital due to angioedema and tongue swelling.  1) angioedema -most likely cause of patient's angioedema was use of ACE inhibitor. -It was thought originally that this was related to Augmentin and the patient was taking for his staph aureus lung abscess, but on further questioning he took someone else's Lisinopril due to high BP and then presented with Tongue swelling/angioedema.  - Patient was seen by ENT and had to go for emergent airway due to his significant tongue swelling and angioedema. Patient was started on IV Decadron, Pepcid, Benadryl. -After aggressive therapy patient is tongue swelling and angioedema have significantly improved. He has extubated himself is after admission. He is clinically doing well without any evidence of rash or anaphylaxis and therefore being discharged home on oral prednisone taper.  2) Hx of RUL necrotizing pneumonia - CT chest on this admission showing improvement in the right lung lesion. Patient remained Afebrile, hemodynamically stable. No secretions.  - Patient was seen by infectious disease and he recommended  continuing his Augmentin as that was unlikely the cause of patient's angioedema or allergic reaction. He still has 3 more weeks of therapy left for treatment of his underlying necrotizing pneumonia/lung abscess.  3) COPD: Patient had no acute exacerbation  -Patient will continue his Dulera, albuterol inhaler as needed.  4) Diverticulosis w/ diverticulitis - this seems chronic for the patient as he has had previous CT abdomen pelvis with similar findings. He remained Currently afebrile, hemodynamically stable with no abdominal pain, N/V. - Patient was tolerating by mouth well prior to discharge without any evidence of acute diverticulitis.   5) Glaucoma - cont. Xalantan eye drops.   6) Chronic Pain - cont. Oxycodone.   7) neuropathy-patient will continue his Neurontin.  DISCHARGE CONDITIONS:   Stable  CONSULTS OBTAINED:  Treatment Team:  Linus Salmonshapman McQueen, MD Clydie Braunavid Fitzgerald, MD Stephanie AcreVishal Mungal, MD  DRUG ALLERGIES:  No Known Allergies  DISCHARGE MEDICATIONS:   Discharge Medication List as of 04/25/2015  9:13 AM    START taking these medications   Details  predniSONE (DELTASONE) 10 MG tablet Label  & dispense according to the schedule below. 5 Pills PO for 1 day then, 4 Pills PO for 1 day, 3 Pills PO for 1 day, 2 Pills PO for 1 day, 1 Pill PO for 1 days then STOP., Print      CONTINUE these medications which have NOT CHANGED   Details  albuterol (PROVENTIL HFA;VENTOLIN HFA) 108 (90 BASE) MCG/ACT inhaler Inhale 1 puff into the lungs every 6 (six) hours as needed for wheezing or shortness of breath., Until Discontinued, Historical Med    albuterol (  PROVENTIL) (2.5 MG/3ML) 0.083% nebulizer solution Take 3 mLs (2.5 mg total) by nebulization every 6 (six) hours as needed for wheezing or shortness of breath., Starting 04/06/2015, Until Discontinued, Normal    amoxicillin-clavulanate (AUGMENTIN) 875-125 MG tablet Take 1 tablet by mouth every 12 (twelve) hours. 1 tab two times a day for  4 weeks, Starting 04/06/2015, Until Discontinued, Normal    cetirizine (ZYRTEC) 10 MG tablet Take 10 mg by mouth daily., Until Discontinued, Historical Med    fluticasone (FLONASE) 50 MCG/ACT nasal spray Place 1 spray into both nostrils daily., Until Discontinued, Historical Med    gabapentin (NEURONTIN) 300 MG capsule Take 1,200 mg by mouth 2 (two) times daily., Until Discontinued, Historical Med    latanoprost (XALATAN) 0.005 % ophthalmic solution Place 1 drop into both eyes at bedtime., Until Discontinued, Historical Med    mometasone-formoterol (DULERA) 100-5 MCG/ACT AERO Inhale 2 puffs into the lungs 2 (two) times daily., Until Discontinued, Historical Med    ondansetron (ZOFRAN) 4 MG tablet Take 1 tablet (4 mg total) by mouth every 8 (eight) hours as needed for nausea., Starting 04/06/2015, Until Discontinued, Normal    oxyCODONE-acetaminophen (PERCOCET/ROXICET) 5-325 MG tablet Take 1 tablet by mouth every 6 (six) hours as needed for moderate pain., Starting 04/06/2015, Until Discontinued, Print    pantoprazole (PROTONIX) 40 MG tablet Take 40 mg by mouth daily., Until Discontinued, Historical Med    QUEtiapine (SEROQUEL XR) 200 MG 24 hr tablet Take 200 mg by mouth 2 (two) times daily., Until Discontinued, Historical Med    traZODone (DESYREL) 100 MG tablet Take 100 mg by mouth 3 (three) times daily., Until Discontinued, Historical Med    zolpidem (AMBIEN) 10 MG tablet Take 10 mg by mouth at bedtime as needed for sleep., Until Discontinued, Historical Med    guaiFENesin-dextromethorphan (ROBITUSSIN DM) 100-10 MG/5ML syrup Take 5 mLs by mouth every 6 (six) hours as needed for cough., Starting 04/06/2015, Until Discontinued, Normal    omeprazole (PRILOSEC) 20 MG capsule Take 1 capsule (20 mg total) by mouth daily., Starting 02/15/2015, Until Fri 02/15/16, Print    ondansetron (ZOFRAN ODT) 4 MG disintegrating tablet Take 1 tablet (4 mg total) by mouth every 8 (eight) hours as needed for  nausea or vomiting., Starting 02/15/2015, Until Discontinued, Print         DISCHARGE INSTRUCTIONS:   DIET:  Cardiac diet  DISCHARGE CONDITION:  Stable  ACTIVITY:  Activity as tolerated  OXYGEN:  Home Oxygen: No.   Oxygen Delivery: room air  DISCHARGE LOCATION:  home   If you experience worsening of your admission symptoms, develop shortness of breath, life threatening emergency, suicidal or homicidal thoughts you must seek medical attention immediately by calling 911 or calling your MD immediately  if symptoms less severe.  You Must read complete instructions/literature along with all the possible adverse reactions/side effects for all the Medicines you take and that have been prescribed to you. Take any new Medicines after you have completely understood and accpet all the possible adverse reactions/side effects.   Please note  You were cared for by a hospitalist during your hospital stay. If you have any questions about your discharge medications or the care you received while you were in the hospital after you are discharged, you can call the unit and asked to speak with the hospitalist on call if the hospitalist that took care of you is not available. Once you are discharged, your primary care physician will handle any further medical issues. Please note  that NO REFILLS for any discharge medications will be authorized once you are discharged, as it is imperative that you return to your primary care physician (or establish a relationship with a primary care physician if you do not have one) for your aftercare needs so that they can reassess your need for medications and monitor your lab values.     Today   No Chest pain, shortness of breath, nausea, vomiting.  VITAL SIGNS:  Blood pressure 154/82, pulse 71, temperature 97.8 F (36.6 C), temperature source Oral, resp. rate 18, height 6' (1.829 m), weight 72.258 kg (159 lb 4.8 oz), SpO2 99 %.  I/O:   Intake/Output Summary  (Last 24 hours) at 04/25/15 1344 Last data filed at 04/24/15 1700  Gross per 24 hour  Intake    240 ml  Output      0 ml  Net    240 ml    PHYSICAL EXAMINATION:  GENERAL:  54 y.o.-year-old patient lying in the bed with no acute distress.  EYES: Pupils equal, round, reactive to light on left eye. No scleral icterus. Extraocular muscles intact. Blind in the right eye HEENT: Head atraumatic, normocephalic. Oropharynx and nasopharynx clear.  NECK:  Supple, no jugular venous distention. No thyroid enlargement, no tenderness.  LUNGS: Normal breath sounds bilaterally, no wheezing, rales,rhonchi. No use of accessory muscles of respiration.  CARDIOVASCULAR: S1, S2 normal. No murmurs, rubs, or gallops.  ABDOMEN: Soft, non-tender, non-distended. Bowel sounds present. No organomegaly or mass.  EXTREMITIES: No pedal edema, cyanosis, or clubbing.  NEUROLOGIC: Cranial nerves II through XII are intact. No focal motor or sensory defecits b/l.  PSYCHIATRIC: The patient is alert and oriented x 3. Good affect.  SKIN: No obvious rash, lesion, or ulcer.   DATA REVIEW:   CBC  Recent Labs Lab 04/24/15 0624  WBC 7.0  HGB 10.2*  HCT 31.9*  PLT 237    Chemistries   Recent Labs Lab 04/22/15 0527 04/24/15 0624  NA 138 139  K 4.5 3.8  CL 103 108  CO2 19* 26  GLUCOSE 79 166*  BUN 15 16  CREATININE 1.16 0.93  CALCIUM 8.7* 8.2*  MG  --  2.3  AST 17  --   ALT 11*  --   ALKPHOS 69  --   BILITOT 0.5  --     Cardiac Enzymes No results for input(s): TROPONINI in the last 168 hours.  Microbiology Results  Results for orders placed or performed during the hospital encounter of 04/22/15  MRSA PCR Screening     Status: Abnormal   Collection Time: 04/22/15  3:00 AM  Result Value Ref Range Status   MRSA by PCR POSITIVE (A) NEGATIVE Final    Comment:        The GeneXpert MRSA Assay (FDA approved for NASAL specimens only), is one component of a comprehensive MRSA colonization surveillance  program. It is not intended to diagnose MRSA infection nor to guide or monitor treatment for MRSA infections. CRITICAL RESULT CALLED TO, READ BACK BY AND VERIFIED WITH: DANA BLAKENEY AT 5361 ON 04/22/15 BY [facility]   Culture, blood (routine x 2)     Status: None (Preliminary result)   Collection Time: 04/22/15  9:31 AM  Result Value Ref Range Status   Specimen Description BLOOD RIGHT WRIST  Final   Special Requests BOTTLES DRAWN AEROBIC AND ANAEROBIC  4CC  Final   Culture NO GROWTH 2 DAYS  Final   Report Status PENDING  Incomplete  Culture, blood (  routine x 2)     Status: None (Preliminary result)   Collection Time: 04/22/15 10:41 AM  Result Value Ref Range Status   Specimen Description BLOOD RIGHT WRIST  Final   Special Requests BOTTLES DRAWN AEROBIC AND ANAEROBIC  17CC  Final   Culture NO GROWTH 2 DAYS  Final   Report Status PENDING  Incomplete    RADIOLOGY:  No results found.    Management plans discussed with the patient, family and they are in agreement.  CODE STATUS:     Code Status Orders        Start     Ordered   04/22/15 0245  Full code   Continuous     04/22/15 0245      TOTAL TIME TAKING CARE OF THIS PATIENT: 40 minutes.    Houston Siren M.D on 04/25/2015 at 1:44 PM  Between 7am to 6pm - Pager - [phone]  After 6pm go to [email or web address] - password [email or web address] [facility]  [facility]  Office  [phone]  CC: Primary care physician; [facility]

## 2015-04-25 NOTE — Progress Notes (Signed)
PHARMACIST - PHYSICIAN COMMUNICATION DR:   Perry Bullock  CONCERNING: Antibiotic IV to Oral Route Change Policy  RECOMMENDATION: This patient is receiving Levaquin by the intravenous route.  Based on criteria approved by the Pharmacy and Therapeutics Committee, the antibiotic(s) is/are being converted to the equivalent oral dose form(s).   DESCRIPTION: These criteria include:  Patient being treated for a respiratory tract infection, urinary tract infection, cellulitis or clostridium difficile associated diarrhea if on metronidazole  The patient is not neutropenic and does not exhibit a GI malabsorption state  The patient is eating (either orally or via tube) and/or has been taking other orally administered medications for a least 24 hours  The patient is improving clinically and has a Tmax < 100.5  If you have questions about this conversion, please contact the Pharmacy Department  []   [phone]( [phone] )  Perry Bullock [x]   [phone]( [phone]  Perry Bullock []   [phone]  Perry Bullock []   [phone]  Perry Bullock []   [phone]  Perry Bullock     Perry Bullock Perry Bullock, PharmD, BCPS Clinical Pharmacist

## 2015-04-25 NOTE — Consult Note (Addendum)
PULMONARY / CRITICAL CARE MEDICINE   Name: Perry Bullock MRN: [id number] DOB: Sep 09, 1960    ADMISSION DATE:  04/22/2015 CONSULTATION DATE: 04/22/15  REFERRING MD :  Dr. Clint Guy   CHIEF COMPLAINT:   Lip/tongue swelling, stridor  SUBJECTIVE  Doing well this morning. Per report my staff, apparently "a friend" gave the patient lisinopril, to help with his BP, and supposedly after this is tongue started swelling up  SIGNIFICANT EVENTS  10/24>>self extubated, doing well.  10/22>>possible angioedema from ? Augmentin>>intubated in the OR by ENT 10/7>> CT Chest>>RUL abscess and PNA>>started on Augmentin    PAST MEDICAL HISTORY    :  Past Medical History  Diagnosis Date  . Bipolar 1 disorder (HCC)   . Chronic back pain     Diverticulosis  . Asthma   . COPD (chronic obstructive pulmonary disease) (HCC)   . Hepatitis   . GERD (gastroesophageal reflux disease)    Past Surgical History  Procedure Laterality Date  . Eye surgery    . Mandible surgery    . Intubation-endotracheal with tracheostomy standby N/A 04/22/2015    Procedure: INTUBATION-ENDOTRACHEAL WITH TRACHEOSTOMY STANDBY;  Surgeon: Linus Salmons, MD;  Location: [facility] ORS;  Service: ENT;  Laterality: N/A;   Prior to Admission medications   Medication Sig Start Date End Date Taking? Authorizing Provider  albuterol (PROVENTIL HFA;VENTOLIN HFA) 108 (90 BASE) MCG/ACT inhaler Inhale 1 puff into the lungs every 6 (six) hours as needed for wheezing or shortness of breath.    Historical Provider, MD  albuterol (PROVENTIL) (2.5 MG/3ML) 0.083% nebulizer solution Take 3 mLs (2.5 mg total) by nebulization every 6 (six) hours as needed for wheezing or shortness of breath. 04/06/15   Enedina Finner, MD  amoxicillin-clavulanate (AUGMENTIN) 875-125 MG tablet Take 1 tablet by mouth every 12 (twelve) hours. 1 tab two times a day for 4 weeks 04/06/15   Enedina Finner, MD  cetirizine (ZYRTEC) 10 MG tablet Take 10 mg by mouth daily.    Historical  Provider, MD  fluticasone (FLONASE) 50 MCG/ACT nasal spray Place 1 spray into both nostrils daily.    Historical Provider, MD  gabapentin (NEURONTIN) 300 MG capsule Take 1,200 mg by mouth 2 (two) times daily.    Historical Provider, MD  guaiFENesin-dextromethorphan (ROBITUSSIN DM) 100-10 MG/5ML syrup Take 5 mLs by mouth every 6 (six) hours as needed for cough. 04/06/15   Enedina Finner, MD  latanoprost (XALATAN) 0.005 % ophthalmic solution Place 1 drop into both eyes at bedtime.    Historical Provider, MD  mometasone-formoterol (DULERA) 100-5 MCG/ACT AERO Inhale 2 puffs into the lungs 2 (two) times daily.    Historical Provider, MD  omeprazole (PRILOSEC) 20 MG capsule Take 1 capsule (20 mg total) by mouth daily. 02/15/15 02/15/16  Gayla Doss, MD  ondansetron (ZOFRAN ODT) 4 MG disintegrating tablet Take 1 tablet (4 mg total) by mouth every 8 (eight) hours as needed for nausea or vomiting. 02/15/15   Gayla Doss, MD  ondansetron (ZOFRAN) 4 MG tablet Take 1 tablet (4 mg total) by mouth every 8 (eight) hours as needed for nausea. 04/06/15   Enedina Finner, MD  oxyCODONE-acetaminophen (PERCOCET/ROXICET) 5-325 MG tablet Take 1 tablet by mouth every 6 (six) hours as needed for moderate pain. 04/06/15   Enedina Finner, MD  pantoprazole (PROTONIX) 40 MG tablet Take 40 mg by mouth daily.    Historical Provider, MD  QUEtiapine (SEROQUEL XR) 200 MG 24 hr tablet Take 200 mg by mouth 2 (two) times daily.  Historical Provider, MD  tiotropium (SPIRIVA) 18 MCG inhalation capsule Place 18 mcg into inhaler and inhale daily.    Historical Provider, MD  traZODone (DESYREL) 100 MG tablet Take 100 mg by mouth 3 (three) times daily.    Historical Provider, MD  zolpidem (AMBIEN) 10 MG tablet Take 10 mg by mouth at bedtime as needed for sleep.    Historical Provider, MD   No Known Allergies   FAMILY HISTORY   Family History  Problem Relation Age of Onset  . Breast cancer Mother   . Hypertension Father   . Diabetes Father        SOCIAL HISTORY  Per chart review  reports that he has been smoking Cigarettes.  He does not have any smokeless tobacco history on file. He reports that he drinks about 3.6 oz of alcohol per week. He reports that he does not use illicit drugs.  Review of Systems  Constitutional: Negative for fever and chills.  HENT: Negative for hearing loss.   Eyes: Positive for blurred vision.       Chronic left eye cataracts  Respiratory: Negative for cough, sputum production and shortness of breath.   Cardiovascular: Negative for chest pain, orthopnea and leg swelling.  Gastrointestinal: Negative for heartburn, nausea and vomiting.  Genitourinary: Negative for dysuria.  Musculoskeletal: Negative for myalgias.  Neurological: Negative for headaches.      VITAL SIGNS    Temp:  [97.8 F (36.6 C)-98.5 F (36.9 C)] 97.8 F (36.6 C) (10/26 0358) Pulse Rate:  [71-95] 71 (10/26 0358) Resp:  [15-18] 18 (10/26 0358) BP: (112-154)/(69-85) 154/82 mmHg (10/26 0358) SpO2:  [92 %-99 %] 99 % (10/26 0358) FiO2 (%):  [21 %] 21 % (10/25 1357) Weight:  [159 lb 4.8 oz (72.258 kg)] 159 lb 4.8 oz (72.258 kg) (10/26 0316) HEMODYNAMICS:   VENTILATOR SETTINGS: Vent Mode:  [-]  FiO2 (%):  [21 %] 21 % INTAKE / OUTPUT:  Intake/Output Summary (Last 24 hours) at 04/25/15 0732 Last data filed at 04/24/15 1700  Gross per 24 hour  Intake    940 ml  Output      0 ml  Net    940 ml       PHYSICAL EXAM   Physical Exam  Constitutional: He is oriented to person, place, and time. No distress.  HENT:  Head: Normocephalic.  Right Ear: External ear normal.  Left Ear: External ear normal.  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  Right sided neck swelling significantly decreased  Cardiovascular: Normal rate.   Pulmonary/Chest: He has no rales.  No resp distress.  Coarse upper airway sounds, dec BS.  Abdominal: Soft.  Musculoskeletal: He exhibits no edema.  Neurological: He is  alert and oriented to person, place, and time.  Skin: Skin is warm and dry.  Nursing note and vitals reviewed.      LABS   LABS:  CBC  Recent Labs Lab 04/22/15 0527 04/24/15 0624  WBC 11.7* 7.0  HGB 11.9* 10.2*  HCT 36.8* 31.9*  PLT 348 237   Coag's No results for input(s): APTT, INR in the last 168 hours. BMET  Recent Labs Lab 04/22/15 0527 04/24/15 0624  NA 138 139  K 4.5 3.8  CL 103 108  CO2 19* 26  BUN 15 16  CREATININE 1.16 0.93  GLUCOSE 79 166*   Electrolytes  Recent Labs Lab 04/22/15 0527 04/24/15 0624  CALCIUM 8.7* 8.2*  MG  --  2.3  PHOS  --  1.4*   Sepsis Markers No results for input(s): LATICACIDVEN, PROCALCITON, O2SATVEN in the last 168 hours. ABG  Recent Labs Lab 04/22/15 0400 04/23/15 0437  PHART 7.26* 7.43  PCO2ART 38 35  PO2ART 178* 95   Liver Enzymes  Recent Labs Lab 04/22/15 0527  AST 17  ALT 11*  ALKPHOS 69  BILITOT 0.5  ALBUMIN 2.8*   Cardiac Enzymes No results for input(s): TROPONINI, PROBNP in the last 168 hours. Glucose  Recent Labs Lab 04/24/15 1137 04/24/15 1752 04/24/15 1954 04/24/15 2149 04/25/15 0048 04/25/15 0357  GLUCAP 212* 179* 114* 108* 126* 133*     Recent Results (from the past 240 hour(s))  MRSA PCR Screening     Status: Abnormal   Collection Time: 04/22/15  3:00 AM  Result Value Ref Range Status   MRSA by PCR POSITIVE (A) NEGATIVE Final    Comment:        The GeneXpert MRSA Assay (FDA approved for NASAL specimens only), is one component of a comprehensive MRSA colonization surveillance program. It is not intended to diagnose MRSA infection nor to guide or monitor treatment for MRSA infections. CRITICAL RESULT CALLED TO, READ BACK BY AND VERIFIED WITH: DANA BLAKENEY AT [id number] ON 04/22/15 BY [facility]   Culture, blood (routine x 2)     Status: None (Preliminary result)   Collection Time: 04/22/15  9:31 AM  Result Value Ref Range Status   Specimen Description BLOOD RIGHT WRIST  Final    Special Requests BOTTLES DRAWN AEROBIC AND ANAEROBIC  4CC  Final   Culture NO GROWTH 2 DAYS  Final   Report Status PENDING  Incomplete  Culture, blood (routine x 2)     Status: None (Preliminary result)   Collection Time: 04/22/15 10:41 AM  Result Value Ref Range Status   Specimen Description BLOOD RIGHT WRIST  Final   Special Requests BOTTLES DRAWN AEROBIC AND ANAEROBIC  17CC  Final   Culture NO GROWTH 2 DAYS  Final   Report Status PENDING  Incomplete     Current facility-administered medications:  .  acetaminophen (TYLENOL) tablet 650 mg, 650 mg, Oral, Q6H PRN, 650 mg at 04/22/15 2237 **OR** acetaminophen (TYLENOL) suppository 650 mg, 650 mg, Rectal, Q6H PRN, Wyatt Hasteavid K Hower, MD .  budesonide (PULMICORT) nebulizer solution 0.5 mg, 0.5 mg, Nebulization, BID, Jaylinn Hellenbrand, MD, 0.5 mg at 04/24/15 2008 .  Chlorhexidine Gluconate Cloth 2 % PADS 6 each, 6 each, Topical, Q0600, Stephanie AcreVishal Danasha Melman, MD, 6 each at 04/25/15 0713 .  dexamethasone (DECADRON) injection 10 mg, 10 mg, Intravenous, Q12H, Houston SirenVivek J Sainani, MD, 10 mg at 04/24/15 2149 .  famotidine (PEPCID) tablet 20 mg, 20 mg, Oral, BID, Houston SirenVivek J Sainani, MD, 20 mg at 04/24/15 2149 .  fluticasone (FLONASE) 50 MCG/ACT nasal spray 1 spray, 1 spray, Each Nare, Daily, Wyatt Hasteavid K Hower, MD, 1 spray at 04/22/15 0912 .  gabapentin (NEURONTIN) capsule 1,200 mg, 1,200 mg, Oral, BID, Wyatt Hasteavid K Hower, MD, 1,200 mg at 04/24/15 2148 .  heparin injection 5,000 Units, 5,000 Units, Subcutaneous, 3 times per day, Wyatt Hasteavid K Hower, MD, 5,000 Units at 04/25/15 [phone] .  insulin aspart (novoLOG) injection 0-15 Units, 0-15 Units, Subcutaneous, TID WC, Houston SirenVivek J Sainani, MD, 3 Units at 04/24/15 1912 .  insulin aspart (novoLOG) injection 0-5 Units, 0-5 Units, Subcutaneous, QHS, Houston SirenVivek J Sainani, MD, 0 Units at 04/24/15 2157 .  ipratropium-albuterol (DUONEB) 0.5-2.5 (3) MG/3ML nebulizer solution 3 mL, 3 mL, Nebulization, TID, Stephanie AcreVishal Trynity Skousen, MD .  latanoprost (XALATAN) 0.005 %  ophthalmic solution 1 drop, 1 drop, Both Eyes, QHS, Wyatt Haste, MD, 1 drop at 04/24/15 2236 .  levofloxacin (LEVAQUIN) IVPB 750 mg, 750 mg, Intravenous, Q24H, Bertram Savin, RPH .  loratadine (CLARITIN) tablet 10 mg, 10 mg, Oral, Daily, Wyatt Haste, MD, 10 mg at 04/24/15 1025 .  metroNIDAZOLE (FLAGYL) IVPB 500 mg, 500 mg, Intravenous, Q8H, Clydie Braun, MD, 500 mg at 04/25/15 0229 .  morphine 2 MG/ML injection 2 mg, 2 mg, Intravenous, Q4H PRN, Wyatt Haste, MD, 2 mg at 04/23/15 1745 .  mupirocin ointment (BACTROBAN) 2 % 1 application, 1 application, Nasal, BID, Stephanie Acre, MD, 1 application at 04/24/15 2238 .  ondansetron (ZOFRAN) injection 4 mg, 4 mg, Intravenous, Once PRN, Yves Dill, MD .  ondansetron [facility]) tablet 4 mg, 4 mg, Oral, Q6H PRN **OR** ondansetron (ZOFRAN) injection 4 mg, 4 mg, Intravenous, Q6H PRN, Wyatt Haste, MD .  oxyCODONE (Oxy IR/ROXICODONE) immediate release tablet 5 mg, 5 mg, Oral, Q4H PRN, Wyatt Haste, MD .  QUEtiapine (SEROQUEL XR) 24 hr tablet 200 mg, 200 mg, Oral, BID, Wyatt Haste, MD, 200 mg at 04/24/15 2149 .  sodium chloride 0.9 % injection 3 mL, 3 mL, Intravenous, Q12H, Wyatt Haste, MD, 3 mL at 04/25/15 0230 .  traZODone (DESYREL) tablet 100 mg, 100 mg, Oral, TID, Wyatt Haste, MD, 100 mg at 04/24/15 1912 .  zolpidem (AMBIEN) tablet 10 mg, 10 mg, Oral, QHS PRN, Houston Siren, MD, 10 mg at 04/24/15 2236  IMAGING    No results found.    Indwelling Urinary Catheter continued, requirement due to   Reason to continue Indwelling Urinary Catheter for strict Intake/Output monitoring for hemodynamic instability         Ventilator continued, requirement due to, resp failure    Ventilator Sedation RASS 0 to -2   Lines:  Cultures: MRSA PCR 10/22>>positive Bld Cx x 2 10/23>>NG x 2 days UA 10/23>>tr leuks  Antibiotics: Unasyn 10/22>>10/23    ASSESSMENT/PLAN   54 year old Caucasian gentleman history of hepatitis C recently  diagnosed with necrotizing pneumonia right upper lobe now admitted and intubated for suspected angioedema with acute COCAINE use   PULMONARY OETT 10/22 in the OR>>self extubated on 10/24 PM Respiratory failure - resolved Angioedema: Questionable etiology, examination findings by ENT consistent with angioedema.  May have inadvertently taking lisinopril from a friend in an attempt to control his BP - per report by staff and significant other.  - self extubated on 10/24  Right upper lobe necrotic lesion/Lung Abscess: currently on IV abx, will plan to transition to PO (clindamycin  PO QID x 14 days), if no true angioedema to augmentin, then can resume augmentin - f/u with Dr. Bard Herbert on 05/01/15  CARDIOVASCULAR Cont with hemodynamic monitoring   RENAL Monitor lytes - replace per ICU protocol  GASTROINTESTINAL A: Hx of diverticulosis (sigmoid) Hx of liver mass Chronic colovesical fistula Hx of Liver abcess - s/p drain in 2012 ? melena - cont with PPI - liver mass and fistula previous [facility]   HEMATOLOGIC - no significant leukocytosis   INFECTIOUS A: ?dental abscess RUL PNA Hx of drug/EtOH abuse P:   - no dental abscess noted on CT, only soft tissue swelling.  - ECHO good EF 55-60%, no valvular abnormality noted  ENDOCRINE -ssi as needed  NEUROLOGIC Accidental Fall Headache - CT head neg, now with appropriate neurological response.  Thank you for consulting [facility], we will signoff at this time.  Please feel free to contacts [place] with any questions.      I have personally obtained a history, examined the patient, evaluated Pertinent laboratory and [employer]/imaging results, and  formulated the assessment and plan   Pulmonary consult time - 35 mins  Stephanie Acre, MD [facility] Pager [phone] (please enter 7-digits) On Call Pager - [phone] (please enter  7-digits)    04/25/2015, 7:32 AM

## 2015-04-26 ENCOUNTER — Telehealth: Payer: Self-pay | Admitting: *Deleted

## 2015-04-26 NOTE — Telephone Encounter (Signed)
-----   Message from Merwyn Katosavid B Simonds, MD sent at 04/26/2015  4:32 AM EDT ----- Please ensure that he has CXR prior to office visit

## 2015-04-26 NOTE — Telephone Encounter (Signed)
Pt informed of CXR. Nothing further needed.

## 2015-04-28 LAB — CULTURE, BLOOD (ROUTINE X 2)
CULTURE: NO GROWTH
CULTURE: NO GROWTH

## 2015-04-30 ENCOUNTER — Ambulatory Visit
Admission: RE | Admit: 2015-04-30 | Discharge: 2015-04-30 | Disposition: A | Discharge status: Home / Self Care | Payer: Medicaid Other | Source: Ambulatory Visit | Attending: Pulmonary Disease | Admitting: Pulmonary Disease

## 2015-04-30 DIAGNOSIS — J852 Abscess of lung without pneumonia: Secondary | ICD-10-CM

## 2015-04-30 DIAGNOSIS — R918 Other nonspecific abnormal finding of lung field: Secondary | ICD-10-CM | POA: Diagnosis not present

## 2015-05-01 ENCOUNTER — Encounter: Payer: Self-pay | Admitting: Pulmonary Disease

## 2015-05-01 ENCOUNTER — Ambulatory Visit (INDEPENDENT_AMBULATORY_CARE_PROVIDER_SITE_OTHER): Payer: Medicaid Other | Admitting: Pulmonary Disease

## 2015-05-01 VITALS — BP 136/78 | HR 103 | Ht 72.0 in | Wt 164.8 lb

## 2015-05-01 DIAGNOSIS — Z720 Tobacco use: Secondary | ICD-10-CM | POA: Diagnosis not present

## 2015-05-01 DIAGNOSIS — F17200 Nicotine dependence, unspecified, uncomplicated: Secondary | ICD-10-CM

## 2015-05-01 DIAGNOSIS — J851 Abscess of lung with pneumonia: Secondary | ICD-10-CM | POA: Diagnosis not present

## 2015-05-01 NOTE — Progress Notes (Signed)
PROBLEMS: RUL lung abscess - hospitalized 10/04 - 04/06/15.   Resp cx revealed only NOF. AFB negative  CT chest 10/04: Large inflammatory RUL cavity with inflammatory appearance and air fluid level. Scattered patchy infiltrates in B lungs  CT chest 10/23: Improving thick-walled cavitary lesion in the right lung apex measuring approximate 7.3 x 4.9 cm  Plan 4-6 weeks amp-sulbactam  CXR 05/01/15: Persistent thick walled right apical cavitary lesion. Stable nodularity in the right midlung and right lower lobe Angioedema 10/23 - 04/25/15  Deemed to be ACEI induced.   Intubated 10/23 - 10/24 Smoker  INTERVAL HISTORY: Rehospitalized 10/23-10/26 as above  SUBJ: Minimal dyspnea. Still with cough productive of gray mucus - far less voluminous than initially. No further fever. No hemoptysis. Has approx 10 more days of Augmentin left. He continues to smoke 4-5 cigs per day. Has Rx for Chantix which he has not filled  OBJ: Filed Vitals:   05/01/15 0929  BP: 136/78  Pulse: 103  Height: 6' (1.829 m)  Weight: 164 lb 12.8 oz (74.753 kg)  SpO2: 100%   NAD HEENT: poor dentition, otherwise WNL Neck: no LAN BS diminished in RUL, no adventitious sounds Reg, no M NABS, soft No C/C/E Neuro grossly intact  DATA: CXR 05/01/15: RUL cavity scarring down. AF level has resolved. No new findings  IMPRESSION: RUL lung abscess - clinically responding to abx therapy Angoiedema - resolved. Does not appear to be due to beta lactam therapy. Most likely ACEI induced Smoker  PLAN: Complete abx as above Counseled re: cessation. Discussed Chantix and I cautiously support trial of this medication but note his hx of bipolar D/O. Stop immediately if exacerbates symptoms Follow up in 3 wks with repeat CXR   Merwyn Katosavid B Keidra Withers, MD [facility]

## 2015-05-17 LAB — ACID FAST SMEAR+CULTURE W/RFLX (ARMC ONLY)
ACID FAST CULTURE: NEGATIVE
Acid Fast Smear: NEGATIVE

## 2015-05-30 ENCOUNTER — Ambulatory Visit (INDEPENDENT_AMBULATORY_CARE_PROVIDER_SITE_OTHER): Payer: Medicaid Other | Admitting: Pulmonary Disease

## 2015-05-30 ENCOUNTER — Encounter: Payer: Self-pay | Admitting: Pulmonary Disease

## 2015-05-30 VITALS — BP 126/74 | HR 98 | Ht 72.0 in | Wt 167.8 lb

## 2015-05-30 DIAGNOSIS — J851 Abscess of lung with pneumonia: Secondary | ICD-10-CM | POA: Diagnosis not present

## 2015-05-30 DIAGNOSIS — Z720 Tobacco use: Secondary | ICD-10-CM | POA: Diagnosis not present

## 2015-05-30 DIAGNOSIS — F17200 Nicotine dependence, unspecified, uncomplicated: Secondary | ICD-10-CM

## 2015-05-30 NOTE — Patient Instructions (Signed)
We will obtain repeat CT scan of the chest to re-evaluate the lung abscess We will consider further antibiotics depending on the findings on the CT scan It is OK for you to undergo dental procedure at any time  Follow up in 6 wks with repeat CXR
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
## 2015-06-05 ENCOUNTER — Ambulatory Visit: Admission: RE | Admit: 2015-06-05 | Payer: Medicaid Other | Source: Ambulatory Visit

## 2015-06-05 ENCOUNTER — Ambulatory Visit: Payer: Medicaid Other

## 2015-06-11 LAB — ACID FAST SMEAR+CULTURE W/RFLX (ARMC ONLY)
ACID FAST SMEAR: NEGATIVE
Acid Fast Culture: NEGATIVE

## 2015-06-12 ENCOUNTER — Ambulatory Visit: Admission: RE | Admit: 2015-06-12 | Payer: Medicaid Other | Source: Ambulatory Visit

## 2015-06-13 ENCOUNTER — Ambulatory Visit: Payer: Medicaid Other

## 2015-06-15 ENCOUNTER — Ambulatory Visit: Payer: Medicaid Other | Admitting: Pulmonary Disease

## 2015-07-05 ENCOUNTER — Ambulatory Visit
Admission: RE | Admit: 2015-07-05 | Discharge: 2015-07-05 | Disposition: A | Discharge status: Home / Self Care | Payer: Medicaid Other | Source: Ambulatory Visit | Attending: Pulmonary Disease | Admitting: Pulmonary Disease

## 2015-07-05 DIAGNOSIS — Z09 Encounter for follow-up examination after completed treatment for conditions other than malignant neoplasm: Secondary | ICD-10-CM | POA: Diagnosis not present

## 2015-07-05 DIAGNOSIS — J449 Chronic obstructive pulmonary disease, unspecified: Secondary | ICD-10-CM | POA: Insufficient documentation

## 2015-07-05 DIAGNOSIS — R918 Other nonspecific abnormal finding of lung field: Secondary | ICD-10-CM | POA: Insufficient documentation

## 2015-07-05 DIAGNOSIS — K7460 Unspecified cirrhosis of liver: Secondary | ICD-10-CM | POA: Diagnosis not present

## 2015-07-05 DIAGNOSIS — K449 Diaphragmatic hernia without obstruction or gangrene: Secondary | ICD-10-CM | POA: Insufficient documentation

## 2015-07-05 DIAGNOSIS — J851 Abscess of lung with pneumonia: Secondary | ICD-10-CM | POA: Diagnosis present

## 2015-07-05 MED ORDER — IOHEXOL 300 MG/ML  SOLN
75.0000 mL | Freq: Once | INTRAMUSCULAR | Status: AC | PRN
  Administered 2015-07-05: 75 mL via INTRAVENOUS

## 2015-07-11 ENCOUNTER — Ambulatory Visit (INDEPENDENT_AMBULATORY_CARE_PROVIDER_SITE_OTHER): Payer: Medicaid Other | Admitting: Pulmonary Disease

## 2015-07-11 ENCOUNTER — Encounter: Payer: Self-pay | Admitting: Pulmonary Disease

## 2015-07-11 VITALS — BP 118/62 | HR 125 | Ht 72.0 in | Wt 170.0 lb

## 2015-07-11 DIAGNOSIS — Z720 Tobacco use: Secondary | ICD-10-CM | POA: Diagnosis not present

## 2015-07-11 DIAGNOSIS — F17200 Nicotine dependence, unspecified, uncomplicated: Secondary | ICD-10-CM

## 2015-07-11 DIAGNOSIS — J850 Gangrene and necrosis of lung: Secondary | ICD-10-CM

## 2015-07-11 DIAGNOSIS — R911 Solitary pulmonary nodule: Secondary | ICD-10-CM

## 2015-07-11 NOTE — Patient Instructions (Signed)
Your pneumonia has cleared up nicely on the CT scan of your chest but there is still a nodule that we need to follow with at least one more CT scan in 3-4 months You may undergo teeth extraction procedure at any time    - if your dentist has any questions, have him/her call me to discuss We talked about the need to quit smoking and discussed strategies We will see you back after a repeat CT scan of your chest in 3-4 months
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
## 2015-08-30 ENCOUNTER — Encounter: Payer: Self-pay | Admitting: *Deleted

## 2015-08-30 ENCOUNTER — Emergency Department
Admission: EM | Admit: 2015-08-30 | Discharge: 2015-08-30 | Disposition: A | Discharge status: Home / Self Care | Payer: Medicaid Other | Attending: Emergency Medicine | Admitting: Emergency Medicine

## 2015-08-30 DIAGNOSIS — F17210 Nicotine dependence, cigarettes, uncomplicated: Secondary | ICD-10-CM | POA: Diagnosis not present

## 2015-08-30 DIAGNOSIS — Y998 Other external cause status: Secondary | ICD-10-CM | POA: Insufficient documentation

## 2015-08-30 DIAGNOSIS — Z7951 Long term (current) use of inhaled steroids: Secondary | ICD-10-CM | POA: Diagnosis not present

## 2015-08-30 DIAGNOSIS — Y9389 Activity, other specified: Secondary | ICD-10-CM | POA: Diagnosis not present

## 2015-08-30 DIAGNOSIS — T310 Burns involving less than 10% of body surface: Secondary | ICD-10-CM | POA: Insufficient documentation

## 2015-08-30 DIAGNOSIS — X102XXA Contact with fats and cooking oils, initial encounter: Secondary | ICD-10-CM | POA: Diagnosis not present

## 2015-08-30 DIAGNOSIS — Y9289 Other specified places as the place of occurrence of the external cause: Secondary | ICD-10-CM | POA: Insufficient documentation

## 2015-08-30 DIAGNOSIS — T25221A Burn of second degree of right foot, initial encounter: Secondary | ICD-10-CM | POA: Diagnosis not present

## 2015-08-30 DIAGNOSIS — Z79899 Other long term (current) drug therapy: Secondary | ICD-10-CM | POA: Insufficient documentation

## 2015-08-30 DIAGNOSIS — J440 Chronic obstructive pulmonary disease with acute lower respiratory infection: Secondary | ICD-10-CM | POA: Diagnosis not present

## 2015-08-30 DIAGNOSIS — J40 Bronchitis, not specified as acute or chronic: Secondary | ICD-10-CM

## 2015-08-30 DIAGNOSIS — R05 Cough: Secondary | ICD-10-CM | POA: Diagnosis present

## 2015-08-30 MED ORDER — ONDANSETRON 4 MG PO TBDP
4.0000 mg | ORAL_TABLET | Freq: Once | ORAL | Status: AC
  Administered 2015-08-30: 4 mg via ORAL
  Filled 2015-08-30: qty 1

## 2015-08-30 MED ORDER — CEFUROXIME AXETIL 500 MG PO TABS: 500.0000 mg | ORAL_TABLET | Freq: Two times a day (BID) | ORAL | Status: AC

## 2015-08-30 MED ORDER — HYDROCODONE-HOMATROPINE 5-1.5 MG/5ML PO SYRP: 5.0000 mL | ORAL_SOLUTION | Freq: Four times a day (QID) | ORAL | Status: DC | PRN

## 2015-08-30 MED ORDER — BACITRACIN ZINC 500 UNIT/GM EX OINT
TOPICAL_OINTMENT | Freq: Once | CUTANEOUS | Status: AC
  Administered 2015-08-30: 1 via TOPICAL
  Filled 2015-08-30: qty 0.9

## 2015-08-30 MED ORDER — PREDNISONE 10 MG (21) PO TBPK
ORAL_TABLET | ORAL | Status: DC
Start: 2015-08-30 — End: 2015-11-22

## 2015-08-30 MED ORDER — PREDNISONE 20 MG PO TABS
60.0000 mg | ORAL_TABLET | Freq: Once | ORAL | Status: AC
  Administered 2015-08-30: 60 mg via ORAL
  Filled 2015-08-30: qty 3

## 2015-08-30 MED ORDER — CEFUROXIME AXETIL 500 MG PO TABS
500.0000 mg | ORAL_TABLET | Freq: Once | ORAL | Status: DC
  Filled 2015-08-30: qty 1
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
## 2015-08-30 NOTE — Discharge Instructions (Signed)
Please take antibiotics and prednisone as directed. Use cough medicine as needed. Continue use of inhalers. You may apply topical antibiotic cream to the burn on the foot. Follow-up with your physician if not improving or return to the emergency room for any worsening symptoms.

## 2015-08-30 NOTE — ED Provider Notes (Signed)
[facility] Emergency Department Provider Note  ____________________________________________  Time seen: Approximately 11:19 PM  I have reviewed the triage vital signs and the nursing notes.   HISTORY  Chief Complaint Foot Burn    HPI Perry Bullock is a 55 y.o. male with known history of COPD and hospitalization in October 2016 forright upper lobe lung abscess followed by pulmonology. Most recent CT of the chest showed improvement of January 2017. The patient presents with 3 days of increasing cough, chest tightness and wheezing. He continues to smoke. No fevers or chills. He is has general malaise. He also reports a burn to the top of the right foot, occurred last week. He is applying topical anabolic cream.   Past Medical History  Diagnosis Date  . Bipolar 1 disorder (HCC)   . Chronic back pain     Diverticulosis  . Asthma   . COPD (chronic obstructive pulmonary disease) (HCC)   . Hepatitis   . GERD (gastroesophageal reflux disease)     Patient Active Problem List   Diagnosis Date Noted  . Angioedema 04/22/2015  . Abscess of upper lobe of right lung without pneumonia (HCC) 04/03/2015  . COPD (chronic obstructive pulmonary disease) (HCC) 04/03/2015  . Hep C w/o coma, chronic (HCC) 04/03/2015  . Bipolar disorder (HCC) 04/03/2015    Past Surgical History  Procedure Laterality Date  . Eye surgery    . Mandible surgery    . Intubation-endotracheal with tracheostomy standby N/A 04/22/2015    Procedure: INTUBATION-ENDOTRACHEAL WITH TRACHEOSTOMY STANDBY;  Surgeon: Linus Salmons, MD;  Location: [facility] ORS;  Service: ENT;  Laterality: N/A;    Current Outpatient Rx  Name  Route  Sig  Dispense  Refill  . albuterol (PROVENTIL HFA;VENTOLIN HFA) 108 (90 BASE) MCG/ACT inhaler   Inhalation   Inhale 1 puff into the lungs every 6 (six) hours as needed for wheezing or shortness of breath.         Marland Kitchen albuterol (PROVENTIL) (2.5 MG/3ML) 0.083% nebulizer  solution   Nebulization   Take 3 mLs (2.5 mg total) by nebulization every 6 (six) hours as needed for wheezing or shortness of breath.   75 mL   12   . cefUROXime (CEFTIN) 500 MG tablet   Oral   Take 1 tablet (500 mg total) by mouth 2 (two) times daily.   20 tablet   0   . cetirizine (ZYRTEC) 10 MG tablet   Oral   Take 10 mg by mouth daily.         . fluticasone (FLONASE) 50 MCG/ACT nasal spray   Each Nare   Place 1 spray into both nostrils daily.         Marland Kitchen gabapentin (NEURONTIN) 300 MG capsule   Oral   Take 1,200 mg by mouth 2 (two) times daily.         Marland Kitchen guaiFENesin-dextromethorphan (ROBITUSSIN DM) 100-10 MG/5ML syrup   Oral   Take 5 mLs by mouth every 6 (six) hours as needed for cough.   118 mL   0   . HYDROcodone-homatropine (HYCODAN) 5-1.5 MG/5ML syrup   Oral   Take 5 mLs by mouth every 6 (six) hours as needed for cough.   120 mL   0   . latanoprost (XALATAN) 0.005 % ophthalmic solution   Both Eyes   Place 1 drop into both eyes at bedtime.         . mometasone-formoterol (DULERA) 100-5 MCG/ACT AERO   Inhalation  Inhale 2 puffs into the lungs 2 (two) times daily.         Marland Kitchen omeprazole (PRILOSEC) 20 MG capsule   Oral   Take 1 capsule (20 mg total) by mouth daily.   30 capsule   1   . ondansetron (ZOFRAN) 4 MG tablet   Oral   Take 1 tablet (4 mg total) by mouth every 8 (eight) hours as needed for nausea.   20 tablet   0   . pantoprazole (PROTONIX) 40 MG tablet   Oral   Take 40 mg by mouth daily.         . predniSONE (STERAPRED UNI-PAK 21 TAB) 10 MG (21) TBPK tablet      6 tablets on day 1, 5 tablets on day 2, 4 tablets on day 3, etc...   21 tablet   0   . QUEtiapine (SEROQUEL XR) 200 MG 24 hr tablet   Oral   Take 200 mg by mouth 2 (two) times daily.         . traZODone (DESYREL) 100 MG tablet   Oral   Take 100 mg by mouth 3 (three) times daily.         Marland Kitchen zolpidem (AMBIEN) 10 MG tablet   Oral   Take 10 mg by mouth at bedtime  as needed for sleep.           Allergies Ace inhibitors  Family History  Problem Relation Age of Onset  . Breast cancer Mother   . Hypertension Father   . Diabetes Father     Social History Social History  Substance Use Topics  . Smoking status: Current Every Day Smoker    Types: Cigarettes  . Smokeless tobacco: None  . Alcohol Use: 3.6 oz/week    6 Cans of beer per week    Review of Systems Constitutional: No fever/chills Eyes: No visual changes. ENT: per HPI Cardiovascular: Denies chest pain. Respiratory: PER HPI Gastrointestinal: No abdominal pain.  No nausea, no vomiting.  No diarrhea.  No constipation. Genitourinary: Negative for dysuria. Musculoskeletal: Negative for back pain. Skin: Negative for rash. Neurological: Negative for headaches, focal weakness or numbness. 10-point ROS otherwise negative.  ____________________________________________   PHYSICAL EXAM:  VITAL SIGNS: ED Triage Vitals  Enc Vitals Group     BP 08/30/15 2132 157/101 mmHg     Pulse Rate 08/30/15 2132 112     Resp 08/30/15 2132 20     Temp 08/30/15 2132 98.6 F (37 C)     Temp Source 08/30/15 2132 Oral     SpO2 08/30/15 2132 96 %     Weight 08/30/15 2132 170 lb (77.111 kg)     Height 08/30/15 2132 6' (1.829 m)     Head Cir --      Peak Flow --      Pain Score 08/30/15 2139 10     Pain Loc --      Pain Edu? --      Excl. in GC? --     Constitutional: Alert and oriented. Well appearing and in no acute distress. Eyes: Conjunctivae are normal. PERRL. EOMI. Ears:  Clear with normal landmarks. No erythema. Head: Atraumatic. Nose: No congestion/rhinnorhea. Mouth/Throat: Mucous membranes are moist.  Oropharynx non-erythematous. No lesions. Neck:  Supple.  No adenopathy.   Cardiovascular: Normal rate, regular rhythm. Grossly normal heart sounds.  Good peripheral circulation. Respiratory: Normal respiratory effort.  Faint wheezing bilaterally. Neurologic:  Normal speech and  language. No gross focal  neurologic deficits are appreciated. No gait instability. Skin:  Skin is warm, dry and intact. No rash noted. Except he has a second-degree burn to the dorsal right foot approximately 2.5 cm x 2 cm. Mild erythema along the edges with granulation tissue. Some slough in the wound. Minimal tenderness. Psychiatric: Mood and affect are normal. Speech and behavior are normal.  ____________________________________________   LABS (all labs ordered are listed, but only abnormal results are displayed)  Labs Reviewed - No data to display ____________________________________________  EKG   ____________________________________________  RADIOLOGY   ____________________________________________   PROCEDURES  Procedure(s) performed: None  Critical Care performed: No  ____________________________________________   INITIAL IMPRESSION / ASSESSMENT AND PLAN / ED COURSE  Pertinent labs & imaging results that were available during my care of the patient were reviewed by me and considered in my medical decision making (see chart for details).  55 year old male with history of COPD and recent hospitalization October 2016 for right upper lobe lung abscess, followed by pulmonology. Improved CT of chest in January with stable condition. Has a return of cough and congestion the last 3 days. He has wheezing, mild on exam. Treated for COPD flare, early onset with prednisone taper and Ceftin. He can follow-up with his physician or pulmonology if not improving. He also has a second degree burn to the dorsal right foot. No signs of cellulitis. He can continue topical antibiotic cream over-the-counter. ____________________________________________   FINAL CLINICAL IMPRESSION(S) / ED DIAGNOSES  Final diagnoses:  Bronchitis  Burn (any degree) involving less than 10% of body surface      Ignacia Bayley, PA-C 08/30/15 2332  Arnaldo Natal, MD 08/30/15 [phone]

## 2015-10-19 DIAGNOSIS — T1490XA Injury, unspecified, initial encounter: Secondary | ICD-10-CM | POA: Insufficient documentation

## 2015-10-19 DIAGNOSIS — S3210XA Unspecified fracture of sacrum, initial encounter for closed fracture: Secondary | ICD-10-CM | POA: Insufficient documentation

## 2015-10-19 DIAGNOSIS — K219 Gastro-esophageal reflux disease without esophagitis: Secondary | ICD-10-CM | POA: Insufficient documentation

## 2015-10-19 DIAGNOSIS — S42212A Unspecified displaced fracture of surgical neck of left humerus, initial encounter for closed fracture: Secondary | ICD-10-CM | POA: Insufficient documentation

## 2015-10-19 DIAGNOSIS — J45909 Unspecified asthma, uncomplicated: Secondary | ICD-10-CM | POA: Insufficient documentation

## 2015-10-19 DIAGNOSIS — J449 Chronic obstructive pulmonary disease, unspecified: Secondary | ICD-10-CM | POA: Insufficient documentation

## 2015-10-31 ENCOUNTER — Ambulatory Visit: Admission: RE | Admit: 2015-10-31 | Payer: Medicaid Other | Source: Ambulatory Visit

## 2015-11-01 ENCOUNTER — Ambulatory Visit: Payer: Medicaid Other | Admitting: Pulmonary Disease

## 2015-11-12 ENCOUNTER — Inpatient Hospital Stay: Payer: Medicaid Other | Admitting: Internal Medicine

## 2015-11-19 ENCOUNTER — Inpatient Hospital Stay: Payer: Medicaid Other | Admitting: Internal Medicine

## 2015-11-21 ENCOUNTER — Ambulatory Visit
Admission: RE | Admit: 2015-11-21 | Discharge: 2015-11-21 | Disposition: A | Discharge status: Home / Self Care | Payer: Medicaid Other | Source: Ambulatory Visit | Attending: Pulmonary Disease | Admitting: Pulmonary Disease

## 2015-11-21 DIAGNOSIS — J850 Gangrene and necrosis of lung: Secondary | ICD-10-CM | POA: Insufficient documentation

## 2015-11-21 DIAGNOSIS — J984 Other disorders of lung: Secondary | ICD-10-CM | POA: Insufficient documentation

## 2015-11-21 MED ORDER — IOPAMIDOL (ISOVUE-370) INJECTION 76%
75.0000 mL | Freq: Once | INTRAVENOUS | Status: AC | PRN
  Administered 2015-11-21: 75 mL via INTRAVENOUS

## 2015-11-22 ENCOUNTER — Encounter: Payer: Self-pay | Admitting: Internal Medicine

## 2015-11-22 ENCOUNTER — Ambulatory Visit (INDEPENDENT_AMBULATORY_CARE_PROVIDER_SITE_OTHER): Payer: Medicaid Other | Admitting: Internal Medicine

## 2015-11-22 VITALS — BP 130/62 | HR 111 | Ht 72.0 in | Wt 166.0 lb

## 2015-11-22 DIAGNOSIS — J850 Gangrene and necrosis of lung: Secondary | ICD-10-CM

## 2015-11-22 MED ORDER — MOMETASONE FURO-FORMOTEROL FUM 200-5 MCG/ACT IN AERO: 2.0000 | INHALATION_SPRAY | Freq: Two times a day (BID) | RESPIRATORY_TRACT | Status: DC

## 2015-11-22 MED ORDER — NICOTINE 21 MG/24HR TD PT24: 21.0000 mg | MEDICATED_PATCH | TRANSDERMAL | Status: DC

## 2015-11-22 NOTE — Patient Instructions (Addendum)
Nicotine patches 21 mg, start 7 days before.  Set memorial day as your quit day. If not covered let us know and we can try chantix script.   Stop dulera 100, start dulera 200-2 puffs twice daily, rinse mouth after each use.   EBUS bronchoscopy in 2-4 weeks.   PFT before bronchoscopy.

## 2015-11-22 NOTE — Progress Notes (Signed)
[facility]     Assessment and Plan:  -Cavitary lung lesion. -Possibly from recent lung abscess, we'll need to perform bronchoscopy to rule out infectious process.  COPD/emphysema. -Suspected diagnosis, the patient has wheezing today. -We'll check full PFT before bronchoscopy.  -Pleural thickening and calcifications on the left side-- likely asbestosis.  -No history of asbestos exposure, he also has reduced lung volume on the left side due to asbestosis. We'll perform bronchoscopy, though given lack of progress in most recent CT scan, doubt mesothelioma. 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  -Pneumonia with nodular lung changes. -Pneumonia, appears resolved, we'll continue to monitor.  -Nicotine abuse.  -Discussed the importance of smoking cessation, the patient was given a prescription for nicotine patch. Today, we set a quit date which she was agreeable to, which is in 1 week from now. -Discussed the importance of smoking cessation before the bronchoscopy, as continued smoking will significantly increase his risk of complications.   Date: 11/22/2015  MRN# [id number] ERICKSON YAMASHIRO Sep 19, 1960   Jewett Mcgann Derick is a 55 y.o. old male seen in follow up for chief complaint of  Chief Complaint  Patient presents with  . Hospitalization Follow-up    DS pt. pt c/o chest tightness early in morning & late @ night, occ sob, prod cough yellow in color, wheezing X55mo     HPI:   The patient is a 55 year old male with a history of lung abscess, seen in November of last year. At that time it was noted that he was a smoker. He was treated with a prolonged course of antibiotics was then seen in January of this year where CT showed resolution of the right lung abscess. He was seen in the emergency room in March of this year with a burn to the dorsum of his right foot, at that time  he was also noted that he was having a COPD exacerbation, he was treated with a prednisone taper and Ceftin.   Since his last visit he notes that he continue to have a mild productive cough. 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 He has no hemoptysis.  He smokes about 10 cigs per day.  He is taking spiriva twice daily, dulera 2 puffs twice daily. He takes proair prn. 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 He is wearing a sling on his left arm because he fell off the roof.   Review of CT chest images from 11/21/15, in comparison with previous from 07/05/15: There are scattered tree and bud changes in the right lung, along with bronchiectatic changes. There is a thick walled former abscess cavity seen in the right upper lobe. There is also scattered paratracheal lymphadenopathy.. In the left lung. There is a pleural thickening with calcification seen. Overall, these changes are minimally changed from the Previous CT of the chest.  Medication:   Outpatient Encounter Prescriptions as of 11/22/2015  Medication Sig  . albuterol (PROVENTIL HFA;VENTOLIN HFA) 108 (90 BASE) MCG/ACT inhaler Inhale 1 puff into the lungs every 6 (six) hours as needed for wheezing or shortness of breath.  Marland Kitchen albuterol (PROVENTIL) (2.5 MG/3ML) 0.083% nebulizer solution Take 3 mLs (2.5 mg total) by nebulization every 6 (six) hours as needed for wheezing or shortness of breath.  . cetirizine (ZYRTEC) 10 MG tablet Take 10 mg by mouth daily.  Marland Kitchen  fluticasone (FLONASE) 50 MCG/ACT nasal spray Place 1 spray into both nostrils daily.  Marland Kitchen gabapentin (NEURONTIN) 300 MG capsule Take 1,200 mg by mouth 2 (two) times daily.  Marland Kitchen guaiFENesin-dextromethorphan (ROBITUSSIN DM) 100-10 MG/5ML syrup Take 5 mLs by mouth every 6 (six) hours as needed for cough.  Marland Kitchen HYDROcodone-homatropine (HYCODAN) 5-1.5 MG/5ML syrup Take 5 mLs by mouth every 6 (six) hours as needed for cough.  . latanoprost (XALATAN)  0.005 % ophthalmic solution Place 1 drop into both eyes at bedtime.  . mometasone-formoterol (DULERA) 100-5 MCG/ACT AERO Inhale 2 puffs into the lungs 2 (two) times daily.  Marland Kitchen omeprazole (PRILOSEC) 20 MG capsule Take 1 capsule (20 mg total) by mouth daily.  . ondansetron (ZOFRAN) 4 MG tablet Take 1 tablet (4 mg total) by mouth every 8 (eight) hours as needed for nausea.  . pantoprazole (PROTONIX) 40 MG tablet Take 40 mg by mouth daily.  . predniSONE (STERAPRED UNI-PAK 21 TAB) 10 MG (21) TBPK tablet 6 tablets on day 1, 5 tablets on day 2, 4 tablets on day 3, etc...  . QUEtiapine (SEROQUEL XR) 200 MG 24 hr tablet Take 200 mg by mouth 2 (two) times daily.  . traZODone (DESYREL) 100 MG tablet Take 100 mg by mouth 3 (three) times daily.  Marland Kitchen zolpidem (AMBIEN) 10 MG tablet Take 10 mg by mouth at bedtime as needed for sleep.   No facility-administered encounter medications on file as of 11/22/2015.     Allergies:  Ace inhibitors  Review of Systems: Gen:  Denies  fever, sweats. HEENT: Denies blurred vision. Cvc:  No dizziness, chest pain or heaviness Resp:   Denies cough or sputum porduction. Gi: Denies swallowing difficulty, stomach pain. constipation, bowel incontinence Gu:  Denies bladder incontinence, burning urine Ext:   No Joint pain, stiffness. Skin: No skin rash, easy bruising. Endoc:  No polyuria, polydipsia. Psych: No depression, insomnia. Other:  All other systems were reviewed and found to be negative other than what is mentioned in the HPI.   Physical Examination:   VS: BP 130/62 mmHg  Pulse 111  Ht 6' (1.829 m)  Wt 166 lb (75.297 kg)  BMI 22.51 kg/m2  SpO2 96%  General Appearance: No distress  Neuro:without focal findings,  speech normal,  HEENT: PERRLA, EOM intact. Pulmonary: normal breath sounds, No wheezing.   CardiovascularNormal S1,S2.  No m/r/g.   Abdomen: Benign, Soft, non-tender. Renal:  No costovertebral tenderness  GU:  Not performed at this time. Endoc: No  evident thyromegaly, no signs of acromegaly. Skin:   warm, no rash. Extremities: normal, no cyanosis, clubbing.   LABORATORY PANEL:   CBC No results for input(s): WBC, HGB, HCT, PLT in the last 168 hours. ------------------------------------------------------------------------------------------------------------------  Chemistries  No results for input(s): NA, K, CL, CO2, GLUCOSE, BUN, CREATININE, CALCIUM, MG, AST, ALT, ALKPHOS, BILITOT in the last 168 hours.  Invalid input(s): GFRCGP ------------------------------------------------------------------------------------------------------------------  Cardiac Enzymes No results for input(s): TROPONINI in the last 168 hours. ------------------------------------------------------------  RADIOLOGY:   No results found for this or any previous visit. Results for orders placed during the hospital encounter of 04/30/15  DG Chest 2 View   Narrative CLINICAL DATA:  Right lung abscess for 1 month.  Taking antibiotics.  EXAM: CHEST  2 VIEW  COMPARISON:  CT chest 04/22/2015  FINDINGS: There is a cavitary right upper lobe lesion. There is a peripheral area of airspace disease in the right midlung. There is left calcified pleural plaque. There is no pleural effusion or pneumothorax. The  heart and mediastinal contours are unremarkable.  The osseous structures are unremarkable.  IMPRESSION: Persistent thick walled right apical cavitary lesion. Stable nodularity in the right midlung and right lower lobe.  Calcified left pleural plaque.   Electronically Signed   By: Elige KoHetal  Patel   On: 04/30/2015 14:59    ------------------------------------------------------------------------------------------------------------------  Thank  you for allowing [facility] to assist in the care of your patient. Our recommendations are noted above.  Please contact us if we can be of further service.   Wells Guileseep Michaila Kenney, MD.    [facility] Office Number: [phone]  Santiago Gladavid Kasa, M.D.  Stephanie AcreVishal Mungal, M.D.  Billy Fischeravid Simonds, M.D  11/22/2015

## 2015-12-03 ENCOUNTER — Inpatient Hospital Stay: Admission: RE | Admit: 2015-12-03 | Payer: Medicaid Other | Source: Ambulatory Visit

## 2015-12-06 ENCOUNTER — Ambulatory Visit: Admit: 2015-12-06 | Payer: Medicaid Other | Admitting: Internal Medicine

## 2015-12-06 SURGERY — ENDOBRONCHIAL ULTRASOUND (EBUS)
Anesthesia: Choice

## 2015-12-25 ENCOUNTER — Ambulatory Visit (INDEPENDENT_AMBULATORY_CARE_PROVIDER_SITE_OTHER): Payer: Medicaid Other | Admitting: *Deleted

## 2015-12-25 ENCOUNTER — Telehealth: Payer: Self-pay | Admitting: *Deleted

## 2015-12-25 DIAGNOSIS — R911 Solitary pulmonary nodule: Secondary | ICD-10-CM

## 2015-12-25 LAB — PULMONARY FUNCTION TEST
DL/VA % pred: 57 %
DL/VA: 2.72 ml/min/mmHg/L
DLCO UNC % PRED: 53 %
DLCO UNC: 18.81 ml/min/mmHg
FEF 25-75 Post: 0.93 L/sec
FEF 25-75 Pre: 0.86 L/sec
FEF2575-%Change-Post: 7 %
FEF2575-%PRED-POST: 27 %
FEF2575-%Pred-Pre: 25 %
FEV1-%CHANGE-POST: 0 %
FEV1-%PRED-POST: 44 %
FEV1-%Pred-Pre: 44 %
FEV1-POST: 1.79 L
FEV1-Pre: 1.79 L
FEV1FVC-%Change-Post: -1 %
FEV1FVC-%Pred-Pre: 64 %
FEV6-%CHANGE-POST: 1 %
FEV6-%PRED-POST: 71 %
FEV6-%PRED-PRE: 70 %
FEV6-POST: 3.61 L
FEV6-PRE: 3.54 L
FEV6FVC-%CHANGE-POST: 0 %
FEV6FVC-%PRED-POST: 103 %
FEV6FVC-%PRED-PRE: 102 %
FVC-%Change-Post: 1 %
FVC-%PRED-POST: 69 %
FVC-%Pred-Pre: 68 %
FVC-Post: 3.66 L
FVC-Pre: 3.6 L
POST FEV1/FVC RATIO: 49 %
POST FEV6/FVC RATIO: 99 %
PRE FEV6/FVC RATIO: 98 %
Pre FEV1/FVC ratio: 50 %

## 2015-12-25 MED ORDER — ALBUTEROL SULFATE (2.5 MG/3ML) 0.083% IN NEBU: 2.5000 mg | INHALATION_SOLUTION | Freq: Four times a day (QID) | RESPIRATORY_TRACT | Status: DC | PRN

## 2015-12-25 MED ORDER — TIOTROPIUM BROMIDE MONOHYDRATE 18 MCG IN CAPS: 18.0000 ug | ORAL_CAPSULE | Freq: Every day | RESPIRATORY_TRACT | Status: DC

## 2015-12-25 MED ORDER — MOMETASONE FURO-FORMOTEROL FUM 200-5 MCG/ACT IN AERO: 2.0000 | INHALATION_SPRAY | Freq: Two times a day (BID) | RESPIRATORY_TRACT | Status: DC

## 2015-12-25 MED ORDER — ALBUTEROL SULFATE HFA 108 (90 BASE) MCG/ACT IN AERS: 1.0000 | INHALATION_SPRAY | Freq: Four times a day (QID) | RESPIRATORY_TRACT | Status: DC | PRN

## 2015-12-25 NOTE — Telephone Encounter (Signed)
Ok to refill dulera 200, spiriva, albuterol rescue. PFT showed chronic lung disease, ok to set up for EBUS bronchoscopy in coming weeks.

## 2015-12-25 NOTE — Progress Notes (Signed)
PFT performed today with nitrogen washout. 

## 2015-12-25 NOTE — Telephone Encounter (Signed)
LM for pt stating medications have been sent to pharmacy and will give a call back once bronch scheduled. Nothing further needed.

## 2015-12-25 NOTE — Telephone Encounter (Signed)
Pt came in today for his PFT and ask if we can refill his Dulera 200, Spiriva handihaler, Albuterol rescue inhaler and Albuterol Neb solution. We have never filled them for him before. Please advise if I can refill. I will also place his PFT in your folder to review and then you can let me know if you want me to schedule the bronch. Thanks.

## 2015-12-27 ENCOUNTER — Other Ambulatory Visit: Payer: Medicaid Other

## 2015-12-28 ENCOUNTER — Other Ambulatory Visit: Payer: Medicaid Other

## 2016-01-02 ENCOUNTER — Encounter: Admission: RE | Payer: Self-pay | Source: Ambulatory Visit

## 2016-01-02 ENCOUNTER — Ambulatory Visit: Admission: RE | Admit: 2016-01-02 | Payer: Medicaid Other | Source: Ambulatory Visit | Admitting: Internal Medicine

## 2016-01-02 ENCOUNTER — Telehealth: Payer: Self-pay | Admitting: Internal Medicine

## 2016-01-02 SURGERY — ENDOBRONCHIAL ULTRASOUND (EBUS)
Anesthesia: Choice

## 2016-01-02 NOTE — Telephone Encounter (Signed)
Call wife to change procedure date. Patient did not receive msg until after we were closed Monday about the procedure scheduled for today and cannot make this appt.   Please call wife to r/s and this cannot be on Monday or Friday as she is a weekend option nurse .

## 2016-01-02 NOTE — Telephone Encounter (Signed)
Spoke with wife/care giver on 12/27/15. Pt had received call from day surgery. Spoke with Tacey RuizLeah to make sure she had cancelled pt's procedure. Has been cancelled. Nothing further needed.

## 2016-01-10 ENCOUNTER — Other Ambulatory Visit: Payer: Medicaid Other

## 2016-01-15 ENCOUNTER — Encounter
Admission: RE | Admit: 2016-01-15 | Discharge: 2016-01-15 | Disposition: A | Discharge status: Home / Self Care | Payer: Medicaid Other | Source: Ambulatory Visit | Attending: Internal Medicine | Admitting: Internal Medicine

## 2016-01-15 DIAGNOSIS — Z01812 Encounter for preprocedural laboratory examination: Secondary | ICD-10-CM | POA: Diagnosis not present

## 2016-01-15 HISTORY — DX: Other chronic pain: G89.29

## 2016-01-15 HISTORY — DX: Peripheral vascular disease, unspecified: I73.9

## 2016-01-15 HISTORY — DX: Unspecified glaucoma: H40.9

## 2016-01-15 HISTORY — DX: Unspecified osteoarthritis, unspecified site: M19.90

## 2016-01-15 HISTORY — DX: Headache, unspecified: R51.9

## 2016-01-15 HISTORY — DX: Other seasonal allergic rhinitis: J30.2

## 2016-01-15 HISTORY — DX: Unspecified convulsions: R56.9

## 2016-01-15 HISTORY — DX: Blindness, one eye, unspecified eye: H54.40

## 2016-01-15 HISTORY — DX: Headache: R51

## 2016-01-15 LAB — CBC
HEMATOCRIT: 33.5 % — AB (ref 40.0–52.0)
Hemoglobin: 11.6 g/dL — ABNORMAL LOW (ref 13.0–18.0)
MCH: 28.5 pg (ref 26.0–34.0)
MCHC: 34.7 g/dL (ref 32.0–36.0)
MCV: 82 fL (ref 80.0–100.0)
PLATELETS: 229 10*3/uL (ref 150–440)
RBC: 4.09 MIL/uL — ABNORMAL LOW (ref 4.40–5.90)
RDW: 18 % — ABNORMAL HIGH (ref 11.5–14.5)
WBC: 7.4 10*3/uL (ref 3.8–10.6)

## 2016-01-15 LAB — BASIC METABOLIC PANEL
Anion gap: 8 (ref 5–15)
BUN: 15 mg/dL (ref 6–20)
CALCIUM: 8.4 mg/dL — AB (ref 8.9–10.3)
CO2: 24 mmol/L (ref 22–32)
CREATININE: 1.01 mg/dL (ref 0.61–1.24)
Chloride: 105 mmol/L (ref 101–111)
GFR calc Af Amer: >60 mL/min (ref >60)
GLUCOSE: 188 mg/dL — AB (ref 65–99)
Potassium: 3.8 mmol/L (ref 3.5–5.1)
Sodium: 137 mmol/L (ref 135–145)

## 2016-01-15 LAB — SURGICAL PCR SCREEN
MRSA, PCR: NEGATIVE
Staphylococcus aureus: NEGATIVE

## 2016-01-15 NOTE — Patient Instructions (Signed)
  Your procedure is scheduled on: 01/22/16 Tues Report to Same Day Surgery 2nd floor medical mall To find out your arrival time please call [phone] between 1PM - 3PM on 01/21/16 Mon  Remember: Instructions that are not followed completely may result in serious medical risk, up to and including death, or upon the discretion of your surgeon and anesthesiologist your surgery may need to be rescheduled.    _x___ 1. Do not eat food or drink liquids after midnight. No gum chewing or hard candies.     __x__ 2. No Alcohol for 24 hours before or after surgery.   __x__3. No Smoking for 24 prior to surgery.   ____  4. Bring all medications with you on the day of surgery if instructed.    __x__ 5. Notify your doctor if there is any change in your medical condition     (cold, fever, infections).     Do not wear jewelry, make-up, hairpins, clips or nail polish.  Do not wear lotions, powders, or perfumes. You may wear deodorant.  Do not shave 48 hours prior to surgery. Men may shave face and neck.  Do not bring valuables to the hospital.    [facility] is not responsible for any belongings or valuables.               Contacts, dentures or bridgework may not be worn into surgery.  Leave your suitcase in the car. After surgery it may be brought to your room.  For patients admitted to the hospital, discharge time is determined by your treatment team.   Patients discharged the day of surgery will not be allowed to drive home.    Please read over the following fact sheets that you were given:   [facility] Preparing for Surgery and or MRSA Information   _x___ Take these medicines the morning of surgery with A SIP OF WATER:    1. albuterol (PROVENTIL HFA;VENTOLIN HFA) 108 (90 Base) MCG/ACT inhaler  2.budesonide-formoterol (SYMBICORT) 80-4.5 MCG/ACT inhaler  3.gabapentin (NEURONTIN) 300 MG capsule  4.mometasone-formoterol (DULERA) 200-5 MCG/ACT AERO  5.pantoprazole (PROTONIX) 40 MG  tablet  6.tiotropium (SPIRIVA) 18 MCG inhalation capsule  7.traMADol (ULTRAM) 50 MG tablet ____ Fleet Enema (as directed)   _x___ Use CHG Soap or sage wipes as directed on instruction sheet   _x___ Use inhalers on the day of surgery and bring to hospital day of surgery  ____ Stop metformin 2 days prior to surgery    ____ Take 1/2 of usual insulin dose the night before surgery and none on the morning of           surgery.   __x__ Stop aspirin or coumadin, or plavix Stop aspirin 1 week before surgery  _x__ Stop Anti-inflammatories such as Advil, Aleve, Ibuprofen, Motrin, Naproxen,          Naprosyn, Goodies powders or aspirin products. Ok to take Tylenol.   ____ Stop supplements until after surgery.    ____ Bring C-Pap to the hospital.

## 2016-01-15 NOTE — Pre-Procedure Instructions (Signed)
EKG Ventricular Rate 95 BPM   EKG Atrial Rate 95 BPM   EKG P-R Interval 132 ms  EKG QRS Duration 134 ms  EKG Q-T Interval 392 ms  EKG QTC Calculation 492 ms  EKG Calculated P Axis -2 degrees   EKG Calculated R Axis 93 degrees   EKG Calculated T Axis 21 degrees    Result Narrative  NORMAL SINUS RHYTHM RIGHT BUNDLE BRANCH BLOCK ABNORMAL ECG NO PREVIOUS ECGS AVAILABLE Confirmed by SIMPSON MD, ROSS ([id number]) on 11/06/2015 8:18:28 AM   Status Results Details   Encounter Summary

## 2016-01-24 ENCOUNTER — Encounter: Admission: RE | Payer: Self-pay | Source: Ambulatory Visit

## 2016-01-24 ENCOUNTER — Ambulatory Visit: Admission: RE | Admit: 2016-01-24 | Payer: Medicaid Other | Source: Ambulatory Visit | Admitting: Internal Medicine

## 2016-01-24 SURGERY — ENDOBRONCHIAL ULTRASOUND (EBUS)
Anesthesia: Choice

## 2016-01-31 ENCOUNTER — Telehealth: Payer: Self-pay | Admitting: Internal Medicine

## 2016-01-31 NOTE — Telephone Encounter (Signed)
Patient is ready to schedule his bronch.

## 2016-01-31 NOTE — Telephone Encounter (Signed)
Will get with DR on r/s date.

## 2016-02-01 NOTE — Telephone Encounter (Signed)
Have sent in request for bronch date. Will call pt once confirmation received.

## 2016-02-12 ENCOUNTER — Other Ambulatory Visit: Payer: Medicaid Other

## 2016-02-14 ENCOUNTER — Encounter
Admission: RE | Admit: 2016-02-14 | Discharge: 2016-02-14 | Disposition: A | Discharge status: Home / Self Care | Payer: Medicaid Other | Source: Ambulatory Visit | Attending: Internal Medicine | Admitting: Internal Medicine

## 2016-02-14 DIAGNOSIS — Z01810 Encounter for preprocedural cardiovascular examination: Secondary | ICD-10-CM | POA: Diagnosis present

## 2016-02-14 HISTORY — DX: Anxiety disorder, unspecified: F41.9

## 2016-02-14 HISTORY — DX: Diverticulitis of intestine, part unspecified, without perforation or abscess without bleeding: K57.92

## 2016-02-14 HISTORY — DX: Other intervertebral disc degeneration, lumbar region: M51.36

## 2016-02-14 HISTORY — DX: Other intervertebral disc degeneration, lumbar region without mention of lumbar back pain or lower extremity pain: M51.369

## 2016-02-14 HISTORY — DX: Pneumonia, unspecified organism: J18.9

## 2016-02-14 HISTORY — DX: Reserved for inherently not codable concepts without codable children: IMO0001

## 2016-02-14 LAB — SURGICAL PCR SCREEN
MRSA, PCR: NEGATIVE
Staphylococcus aureus: NEGATIVE

## 2016-02-14 NOTE — Patient Instructions (Signed)
  Your procedure is scheduled on:February 19, 2016 (Tuesday) Report to Same Day Surgery 2nd floor Medical Mall To find out your arrival time please call [phone] between 1PM - 3PM on February 18, 2016 (Monday)  Remember: Instructions that are not followed completely may result in serious medical risk, up to and including death, or upon the discretion of your surgeon and anesthesiologist your surgery may need to be rescheduled.    _x___ 1. Do not eat food or drink liquids after midnight. No gum chewing or hard candies.     _x_ 2. No Alcohol for 24 hours before or after surgery.   __x__3. No Smoking for 24 prior to surgery.   ____  4. Bring all medications with you on the day of surgery if instructed.    __x__ 5. Notify your doctor if there is any change in your medical condition     (cold, fever, infections).     Do not wear jewelry, make-up, hairpins, clips or nail polish.  Do not wear lotions, powders, or perfumes. You may wear deodorant.  Do not shave 48 hours prior to surgery. Men may shave face and neck.  Do not bring valuables to the hospital.    [facility] is not responsible for any belongings or valuables.               Contacts, dentures or bridgework may not be worn into surgery.  Leave your suitcase in the car. After surgery it may be brought to your room.  For patients admitted to the hospital, discharge time is determined by your treatment team.   Patients discharged the day of surgery will not be allowed to drive home.    Please read over the following fact sheets that you were given:   [facility] Preparing for Surgery and or MRSA Information   _x___ Take these medicines the morning of surgery with A SIP OF WATER:    1. Gabapentin  2. Pantoprazole (Pantoprazole at bedtime on August 21)  3.  4.  5.  6.  ____ Fleet Enema (as directed)   _x___ Use CHG Soap or sage wipes as directed on instruction sheet   _x___ Use inhalers on the day of surgery and bring to  hospital day of surgery (Use Albuterol nebulizer the morning of surgery along with Spiriva and Dulera inhaler and bring inhalers to hospital)  ____ Stop metformin 2 days prior to surgery    ____ Take 1/2 of usual insulin dose the night before surgery and none on the morning of surgey          __x__ Stop aspirin or coumadin, or plavix (NO ASPIRIN)  _x__ Stop Anti-inflammatories such as Advil, Aleve, Ibuprofen, Motrin, Naproxen,          Naprosyn, Goodies powders or aspirin products. Ok to take Tylenol.   ____ Stop supplements until after surgery.    ____ Bring C-Pap to the hospital.

## 2016-02-15 NOTE — Pre-Procedure Instructions (Signed)
EKG NOTED AND RBBB PRESENT EKG 2016

## 2016-02-19 ENCOUNTER — Telehealth: Payer: Self-pay | Admitting: Internal Medicine

## 2016-02-19 ENCOUNTER — Ambulatory Visit: Admission: RE | Admit: 2016-02-19 | Payer: Medicaid Other | Source: Ambulatory Visit | Admitting: Internal Medicine

## 2016-02-19 ENCOUNTER — Encounter: Admission: RE | Payer: Self-pay | Source: Ambulatory Visit

## 2016-02-19 SURGERY — ENDOBRONCHIAL ULTRASOUND (EBUS)
Anesthesia: Choice

## 2016-02-19 NOTE — Telephone Encounter (Signed)
Spoke with pt caretaker who states pt has been up most of the night, she feels the pt is very nervous about the procedure. At this point pt caretaker states they will hold off on the procedure as the pt just got clearance to have his teeth pulled which they think is causing a lot of problems.  Nothing further needed.   Will route to DR.

## 2016-02-19 NOTE — Telephone Encounter (Signed)
Pt caretaker angela (caretaker) calling stating patient was up all night 3 Not sure if he is worried  But pt states he does not think he can do procedure today They will not be coming today Please advise

## 2016-02-20 ENCOUNTER — Encounter: Payer: Self-pay | Admitting: Internal Medicine

## 2016-02-20 NOTE — Telephone Encounter (Signed)
Pt has now missed 2 procedures without prior notice, these are 2 slots that other patients who are waiting for this procedure could have used.  He will need to find another pulmonologist outside of [facility] and [facility] to follow him in the future.

## 2016-02-20 NOTE — Telephone Encounter (Signed)
Papers for dismissal has been placed in DR's folder. Will close encounter.

## 2016-02-27 ENCOUNTER — Encounter: Payer: Self-pay | Admitting: Internal Medicine

## 2016-02-27 ENCOUNTER — Ambulatory Visit (INDEPENDENT_AMBULATORY_CARE_PROVIDER_SITE_OTHER): Payer: Medicaid Other | Admitting: Internal Medicine

## 2016-02-27 VITALS — BP 134/78 | HR 71 | Ht 72.0 in | Wt 159.4 lb

## 2016-02-27 DIAGNOSIS — J852 Abscess of lung without pneumonia: Secondary | ICD-10-CM

## 2016-02-27 DIAGNOSIS — J9601 Acute respiratory failure with hypoxia: Secondary | ICD-10-CM | POA: Diagnosis not present

## 2016-02-27 DIAGNOSIS — J449 Chronic obstructive pulmonary disease, unspecified: Secondary | ICD-10-CM | POA: Diagnosis not present

## 2016-02-27 MED ORDER — ALBUTEROL SULFATE HFA 108 (90 BASE) MCG/ACT IN AERS: 2.0000 | INHALATION_SPRAY | Freq: Four times a day (QID) | RESPIRATORY_TRACT | 11 refills | Status: DC | PRN

## 2016-02-27 MED ORDER — PREDNISONE 50 MG PO TABS: 50.0000 mg | ORAL_TABLET | Freq: Every day | ORAL | 1 refills | Status: DC

## 2016-02-27 MED ORDER — MOMETASONE FURO-FORMOTEROL FUM 200-5 MCG/ACT IN AERO: 2.0000 | INHALATION_SPRAY | Freq: Two times a day (BID) | RESPIRATORY_TRACT | 11 refills | Status: DC

## 2016-02-27 MED ORDER — AMOXICILLIN-POT CLAVULANATE 875-125 MG PO TABS: 1.0000 | ORAL_TABLET | Freq: Two times a day (BID) | ORAL | 0 refills | Status: AC

## 2016-02-27 MED ORDER — TIOTROPIUM BROMIDE MONOHYDRATE 18 MCG IN CAPS: 18.0000 ug | ORAL_CAPSULE | Freq: Every day | RESPIRATORY_TRACT | 11 refills | Status: DC

## 2016-02-27 NOTE — Addendum Note (Signed)
Addended by: Maxwell MarionBLANKENSHIP, MARGIE A on: 02/27/2016 10:04 AM   Modules accepted: Orders

## 2016-02-27 NOTE — Patient Instructions (Signed)
1.start Oxygen therapy 2.check ONO 3.start prednisone 50 mg daily fro 10 days 4.satrt Augmentin 875 BID for 10 days 5.will need repeat CT chest   Follow up with Dr Ardyth Man in 2-4 weeks  Chronic Respiratory Failure Respiratory failure is when your lungs are not working well and your breathing (respiratory) system fails. When respiratory failure occurs, it is difficult for your lungs to get enough oxygen or get rid of carbon dioxide or both. Respiratory failure can be life threatening.  Respiratory failure can be acute or chronic. Acute respiratory failure is sudden, severe, and requires emergency medical treatment. Chronic respiratory failure is less severe, happens over time, and requires ongoing treatment.  CAUSES  Any problem affecting the heart or lungs can cause respiratory failure. Some of these causes may be:  Chronic bronchitis and emphysema (COPD).  Blood clot going to the lung (pulmonary embolism).  Having water in the lungs caused by heart failure, lung injury, or infection (pulmonary edema).  Collapsed lung (pneumothorax).  Pneumonia.  Pulmonary fibrosis.  Obesity.  Asthma.  Heart failure.  Any type of trauma to the chest that can make breathing difficult.  Nerve or muscle diseases making chest movements difficult. SYMPTOMS  Signs and symptoms of chronic respiratory failure include:  Shortness of breath (dyspnea) with or without activity.  Rapid, fast breathing (tachypnea).  Wheezing.  Fast heart rate.  Bluish color to the fingernail or toenail beds.  Confusion or drowsiness or both. DIAGNOSIS  Initial diagnosis requires a thorough history and a physical exam by your health care provider. Additional tests may include:  Chest X-ray.  CT scan of your lungs.  Ultrasound to check for blood clots.  Blood tests, such as an arterial blood gas test (ABG). This is a blood test that looks at the oxygen and carbon dioxide levels in your arterial blood.  Your  vital signs will be taken. This includes your respiratory rate (how many times a minute you are breathing), oxygen saturation (this measures the oxygen level in your blood), heart rate, and blood pressure. These numbers help your health care provider determine the next steps.  Electrocardiogram. TREATMENT  Treatment of chronic respiratory failure depends on the cause of the respiratory failure. Treatment can include the following:  Oxygen. Oxygen can be delivered through the following:  Nasal cannula. This is small tubing that goes in your nose to give you oxygen.  Face mask. A face mask covers your nose and mouth to give you oxygen.  Medicine. Different medicines can be given to help with breathing. These can include:  Nebulizers. Nebulizers deliver medicines to open the air passages (bronchodilators). These medicines help to open or relax the airways in the lungs so you can breathe better. They can also help loosen mucus from your lungs.  Diuretics. Diuretic medicines can help you breathe better by getting rid of extra fluid in your body.  Steroids. Steroid medicines can help decrease inflammation in your lungs.  Chest tube. If you have a collapsed lung (pneumothorax), a chest tube is placed to help reinflate the lung.  Noninvasive positive pressure ventilation (NPPV). This is a tight-fitting mask that goes over your nose and mouth. The mask has tubing that is attached to a machine. The machine blows air into the tubing, which helps to keep the tiny air sacs (alveoli) in your lungs open. This machine allows you to breathe on your own.  Ventilator. A ventilator is a breathing machine. When on a ventilator, a breathing tube is put into the  lungs. A ventilator is used when you can no longer breathe well enough on your own. You may have low oxygen levels or high carbon dioxide (CO2) levels in your blood. When you are on a ventilator, sedation and pain medicines are given to make you sleep so  your lungs can heal. HOME CARE INSTRUCTIONS  Follow your health care provider's directions about medicines and respiratory therapy.  Quit smoking if you smoke. SEEK MEDICAL CARE IF:  You have increasing shortness of breath and are less functional than you have been.  You have increased sputum, wheezing, coughing, or loss of energy.  You are on oxygen and are requiring more. SEEK IMMEDIATE MEDICAL CARE IF:  Your shortness of breath is significantly worse.  You are unable to say more than a few words without having to catch your breath.  You are much less functional. MAKE SURE YOU:  Understand these instructions.  Will watch your condition.  Will get help right away if you are not doing well or get worse.   This information is not intended to replace advice given to you by your health care provider. Make sure you discuss any questions you have with your health care provider.   Document Released: 06/16/2005 Document Revised: 03/07/2015 Document Reviewed: 04/14/2013 Elsevier Interactive Patient Education [employer]2016 Elsevier Inc.
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
## 2016-02-27 NOTE — Addendum Note (Signed)
Addended by: Maxwell MarionBLANKENSHIP, Keanu Frickey A on: 02/27/2016 09:43 AM   Modules accepted: Orders

## 2016-02-27 NOTE — Addendum Note (Signed)
Addended by: Maxwell MarionBLANKENSHIP, Abiola Behring A on: 02/27/2016 10:00 AM   Modules accepted: Orders

## 2016-02-28 ENCOUNTER — Telehealth: Payer: Self-pay | Admitting: Internal Medicine

## 2016-02-28 NOTE — Telephone Encounter (Signed)
Patient dismissed from [facility] by Shane CrutchPradeep Ramachandran MD , effective February 20, 2016. Dismissal letter sent out by certified / registered mail. DAJ

## 2016-03-04 ENCOUNTER — Telehealth: Payer: Self-pay

## 2016-03-04 NOTE — Telephone Encounter (Signed)
Spoke with patient's girlfriend Marylene Landngela, who states they received a dismissal letter from our office. Pt currently has up coming appointment on 03/13/16 as well as a CT on 03/06/16. Pt girlfriend wanted to clarify rather or not the patient should still come to those scheduled appointment, I advised Marylene Landngela that we have 30 days within the date that the letter was sent out to see the patient therefore we will still be able to see him for his scheduled appointment.  Nothing further needed.

## 2016-03-05 ENCOUNTER — Telehealth: Payer: Self-pay | Admitting: Internal Medicine

## 2016-03-05 ENCOUNTER — Encounter: Payer: Self-pay | Admitting: Emergency Medicine

## 2016-03-05 ENCOUNTER — Emergency Department
Admission: EM | Admit: 2016-03-05 | Discharge: 2016-03-05 | Disposition: A | Discharge status: Home / Self Care | Payer: Medicaid Other | Attending: Emergency Medicine | Admitting: Emergency Medicine

## 2016-03-05 DIAGNOSIS — F10120 Alcohol abuse with intoxication, uncomplicated: Secondary | ICD-10-CM | POA: Diagnosis not present

## 2016-03-05 DIAGNOSIS — J449 Chronic obstructive pulmonary disease, unspecified: Secondary | ICD-10-CM | POA: Insufficient documentation

## 2016-03-05 DIAGNOSIS — F1994 Other psychoactive substance use, unspecified with psychoactive substance-induced mood disorder: Secondary | ICD-10-CM

## 2016-03-05 DIAGNOSIS — F1410 Cocaine abuse, uncomplicated: Secondary | ICD-10-CM

## 2016-03-05 DIAGNOSIS — Z7982 Long term (current) use of aspirin: Secondary | ICD-10-CM | POA: Insufficient documentation

## 2016-03-05 DIAGNOSIS — F3162 Bipolar disorder, current episode mixed, moderate: Secondary | ICD-10-CM | POA: Diagnosis not present

## 2016-03-05 DIAGNOSIS — J45909 Unspecified asthma, uncomplicated: Secondary | ICD-10-CM | POA: Insufficient documentation

## 2016-03-05 DIAGNOSIS — Z5181 Encounter for therapeutic drug level monitoring: Secondary | ICD-10-CM | POA: Insufficient documentation

## 2016-03-05 DIAGNOSIS — F17210 Nicotine dependence, cigarettes, uncomplicated: Secondary | ICD-10-CM | POA: Insufficient documentation

## 2016-03-05 DIAGNOSIS — F1010 Alcohol abuse, uncomplicated: Secondary | ICD-10-CM

## 2016-03-05 DIAGNOSIS — F10920 Alcohol use, unspecified with intoxication, uncomplicated: Secondary | ICD-10-CM

## 2016-03-05 LAB — COMPREHENSIVE METABOLIC PANEL
ALK PHOS: 63 U/L (ref 38–126)
ALT: 21 U/L (ref 17–63)
ANION GAP: 9 (ref 5–15)
AST: 55 U/L — ABNORMAL HIGH (ref 15–41)
Albumin: 3.9 g/dL (ref 3.5–5.0)
BILIRUBIN TOTAL: 0.5 mg/dL (ref 0.3–1.2)
BUN: 7 mg/dL (ref 6–20)
CALCIUM: 8.3 mg/dL — AB (ref 8.9–10.3)
CO2: 24 mmol/L (ref 22–32)
CREATININE: 0.66 mg/dL (ref 0.61–1.24)
Chloride: 105 mmol/L (ref 101–111)
Glucose, Bld: 94 mg/dL (ref 65–99)
Potassium: 3.6 mmol/L (ref 3.5–5.1)
SODIUM: 138 mmol/L (ref 135–145)
TOTAL PROTEIN: 6.8 g/dL (ref 6.5–8.1)

## 2016-03-05 LAB — CBC WITH DIFFERENTIAL/PLATELET
BASOS ABS: 0 10*3/uL (ref 0–0.1)
BASOS PCT: 0 %
EOS ABS: 0.1 10*3/uL (ref 0–0.7)
EOS PCT: 2 %
HCT: 37.1 % — ABNORMAL LOW (ref 40.0–52.0)
HEMOGLOBIN: 12.2 g/dL — AB (ref 13.0–18.0)
Lymphocytes Relative: 28 %
Lymphs Abs: 1.7 10*3/uL (ref 1.0–3.6)
MCH: 27.1 pg (ref 26.0–34.0)
MCHC: 32.9 g/dL (ref 32.0–36.0)
MCV: 82.2 fL (ref 80.0–100.0)
Monocytes Absolute: 0.7 10*3/uL (ref 0.2–1.0)
Monocytes Relative: 11 %
NEUTROS PCT: 59 %
Neutro Abs: 3.6 10*3/uL (ref 1.4–6.5)
PLATELETS: 168 10*3/uL (ref 150–440)
RBC: 4.51 MIL/uL (ref 4.40–5.90)
RDW: 19.6 % — ABNORMAL HIGH (ref 11.5–14.5)
WBC: 6 10*3/uL (ref 3.8–10.6)

## 2016-03-05 LAB — URINE DRUG SCREEN, QUALITATIVE (ARMC ONLY)
Amphetamines, Ur Screen: NOT DETECTED
Barbiturates, Ur Screen: NOT DETECTED
Benzodiazepine, Ur Scrn: NOT DETECTED
CANNABINOID 50 NG, UR ~~LOC~~: NOT DETECTED
COCAINE METABOLITE, UR ~~LOC~~: NOT DETECTED
MDMA (ECSTASY) UR SCREEN: NOT DETECTED
METHADONE SCREEN, URINE: NOT DETECTED
Opiate, Ur Screen: POSITIVE — AB
Phencyclidine (PCP) Ur S: NOT DETECTED
TRICYCLIC, UR SCREEN: NOT DETECTED

## 2016-03-05 LAB — ETHANOL: ALCOHOL ETHYL (B): 309 mg/dL — AB (ref <5)

## 2016-03-05 MED ORDER — LATANOPROST 0.005 % OP SOLN
1.0000 [drp] | Freq: Every day | OPHTHALMIC | Status: DC
  Filled 2016-03-05: qty 2.5

## 2016-03-05 MED ORDER — MOMETASONE FURO-FORMOTEROL FUM 200-5 MCG/ACT IN AERO
2.0000 | INHALATION_SPRAY | Freq: Two times a day (BID) | RESPIRATORY_TRACT | Status: DC
  Filled 2016-03-05: qty 8.8

## 2016-03-05 MED ORDER — QUETIAPINE FUMARATE 25 MG PO TABS: 300.0000 mg | ORAL_TABLET | Freq: Every day | ORAL | Status: DC

## 2016-03-05 MED ORDER — POLYVINYL ALCOHOL 1.4 % OP SOLN
1.0000 [drp] | Freq: Once | OPHTHALMIC | Status: AC
  Administered 2016-03-05: 1 [drp] via OPHTHALMIC
  Filled 2016-03-05: qty 15

## 2016-03-05 MED ORDER — TIOTROPIUM BROMIDE MONOHYDRATE 18 MCG IN CAPS
18.0000 ug | ORAL_CAPSULE | Freq: Every day | RESPIRATORY_TRACT | Status: DC
  Administered 2016-03-05: 18 ug via RESPIRATORY_TRACT
  Filled 2016-03-05: qty 5

## 2016-03-05 MED ORDER — POLYETHYL GLYCOL-PROPYL GLYCOL 0.4-0.3 % OP SOLN: 1.0000 [drp] | Freq: Once | OPHTHALMIC | Status: DC

## 2016-03-05 MED ORDER — MOMETASONE FURO-FORMOTEROL FUM 200-5 MCG/ACT IN AERO: 2.0000 | INHALATION_SPRAY | Freq: Two times a day (BID) | RESPIRATORY_TRACT | Status: DC

## 2016-03-05 MED ORDER — PANTOPRAZOLE SODIUM 40 MG PO TBEC
40.0000 mg | DELAYED_RELEASE_TABLET | Freq: Every day | ORAL | Status: DC
  Administered 2016-03-05: 40 mg via ORAL
  Filled 2016-03-05: qty 1

## 2016-03-05 MED ORDER — ALBUTEROL SULFATE (2.5 MG/3ML) 0.083% IN NEBU: 2.5000 mg | INHALATION_SOLUTION | Freq: Four times a day (QID) | RESPIRATORY_TRACT | Status: DC | PRN

## 2016-03-05 MED ORDER — NICOTINE 21 MG/24HR TD PT24
21.0000 mg | MEDICATED_PATCH | TRANSDERMAL | Status: DC
  Administered 2016-03-05: 21 mg via TRANSDERMAL
  Filled 2016-03-05: qty 1

## 2016-03-05 MED ORDER — CARBOXYMETHYLCELLULOSE SODIUM 0.25 % OP SOLN: 2.0000 [drp] | Freq: Four times a day (QID) | OPHTHALMIC | Status: DC

## 2016-03-05 MED ORDER — VITAMIN B-1 100 MG PO TABS
100.0000 mg | ORAL_TABLET | Freq: Every day | ORAL | Status: DC
  Administered 2016-03-05: 100 mg via ORAL
  Filled 2016-03-05: qty 1

## 2016-03-05 MED ORDER — PREDNISONE 10 MG PO TABS
50.0000 mg | ORAL_TABLET | Freq: Every day | ORAL | Status: DC
  Administered 2016-03-05: 50 mg via ORAL
  Filled 2016-03-05: qty 2

## 2016-03-05 MED ORDER — GABAPENTIN 300 MG PO CAPS
900.0000 mg | ORAL_CAPSULE | Freq: Three times a day (TID) | ORAL | Status: DC
  Administered 2016-03-05 (×2): 900 mg via ORAL
  Filled 2016-03-05 (×2): qty 3

## 2016-03-05 MED ORDER — FLUTICASONE PROPIONATE 50 MCG/ACT NA SUSP
2.0000 | NASAL | Status: DC | PRN
  Filled 2016-03-05: qty 16

## 2016-03-05 MED ORDER — IPRATROPIUM-ALBUTEROL 0.5-2.5 (3) MG/3ML IN SOLN
3.0000 mL | Freq: Once | RESPIRATORY_TRACT | Status: AC
  Administered 2016-03-05: 3 mL via RESPIRATORY_TRACT
  Filled 2016-03-05: qty 3

## 2016-03-05 MED ORDER — ALBUTEROL SULFATE HFA 108 (90 BASE) MCG/ACT IN AERS
2.0000 | INHALATION_SPRAY | Freq: Four times a day (QID) | RESPIRATORY_TRACT | Status: DC | PRN
  Filled 2016-03-05: qty 6.7

## 2016-03-05 MED ORDER — AMOXICILLIN-POT CLAVULANATE 875-125 MG PO TABS
1.0000 | ORAL_TABLET | Freq: Two times a day (BID) | ORAL | Status: DC
  Administered 2016-03-05: 1 via ORAL
  Filled 2016-03-05: qty 1

## 2016-03-05 MED ORDER — LORAZEPAM 2 MG PO TABS
0.0000 mg | ORAL_TABLET | Freq: Four times a day (QID) | ORAL | Status: DC
Start: 2016-03-05 — End: 2016-03-05
  Administered 2016-03-05: 2 mg via ORAL
  Filled 2016-03-05: qty 1

## 2016-03-05 MED ORDER — SUCRALFATE 1 G PO TABS
1.0000 g | ORAL_TABLET | Freq: Three times a day (TID) | ORAL | Status: DC
  Administered 2016-03-05 (×2): 1 g via ORAL
  Filled 2016-03-05 (×2): qty 1

## 2016-03-05 NOTE — Discharge Instructions (Signed)
Please seek medical attention and help for any thoughts about wanting to harm herself, harm others, any concerning change in behavior, severe depression, inappropriate drug use or any other new or concerning symptoms. ° °

## 2016-03-05 NOTE — ED Notes (Signed)
SpO2 91% on RA. Patient placed on 2L . Patient states he wears 2L at home.
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
## 2016-03-05 NOTE — Telephone Encounter (Signed)
Per [employer], CT Chest without contrast was denied. CT scheduled for 03/06/16 has been cancelled per Dr. Belia HemanKasa. Pt has F/U appointment with Dr. Nicholos Johnsamachandran on 03/13/16 and if Dr. Nicholos Johnsamachandran feels that CXR or CT Chest is needed, it can be ordered at that time.   Called and lmoam for Marylene Landngela, pt's caregiver to return my call. Called Central Scheduling and cancelled CT Chest for 03/06/16.  Need to make pt and caregiver aware and to keep F/U appointment with Dr. Nicholos Johnsamachandran on 03/13/16 at 12:00. Rhonda J Cobb

## 2016-03-05 NOTE — ED Notes (Signed)
[facility]

## 2016-03-05 NOTE — Telephone Encounter (Signed)
Received signed domestic return receipt verifying delivery of certified letter on 03/04/2016. Article number [id number]. mwc

## 2016-03-05 NOTE — Consult Note (Signed)
[facility] Face-to-Face Psychiatry Consult   Reason for Consult:  55 year old man with a history of alcohol abuse and mood disorder comes into the hospital under IVC with reports that he had threatened his family. Referring Physician:  Cinda Quest Patient Identification: Perry Bullock MRN:  [id number] Principal Diagnosis: Substance induced mood disorder [facility]) Diagnosis:   Patient Active Problem List   Diagnosis Date Noted  . Substance induced mood disorder ([place]) [F19.94] 03/05/2016  . Alcohol abuse [F10.10] 03/05/2016  . Cocaine abuse [F14.10] 03/05/2016  . Angioedema [T78.3XXA] 04/22/2015  . Abscess of upper lobe of right lung without pneumonia ([place]) [J85.2] 04/03/2015  . COPD (chronic obstructive pulmonary disease) ([place]) [J44.9] 04/03/2015  . Hep C w/o coma, chronic ([place]) [B18.2] 04/03/2015  . Bipolar disorder ([place]) [F31.9] 04/03/2015    Total Time spent with patient: 1 hour  Subjective:   Perry Bullock is a 55 y.o. male patient admitted with "I was intoxicated".  HPI:  Patient seen. Chart reviewed. 55 year old man with a history of alcohol abuse and mood instability came into the emergency room late last night blood alcohol level over 300. Commitment papers say that he had threatened his family and threatened to kill himself and been throwing things around inside the house. Patient says he has no memory of any of it. He says he was drinking heavily and he took his trazodone and Seroquel a little earlier than usual and was taking his narcotic pain medicine and blacked out. He says that he has no memory of being depressed or angry. He currently denies any suicidal thoughts at all. Denies any homicidal thoughts. He says he is anxious to get out of here so he can go to his court hearing tomorrow. He estimates that he drinks 6-8 beers last night which is an average amount for him. Also admits that he's been using cocaine recently. Ride trazodone and Seroquel.  Social history: Patient lives with his  wife. Doesn't work outside the home. Has a court hearing pending.  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  Substance abuse history: Long-standing alcohol abuse also intermittent abuse of alcohol cocaine and marijuana. No history of seizures or delirium tremens. Hasn't been to substance abuse treatment in years. Doesn't see it as being a major problem.  Past Psychiatric History: Patient says he's had 1 suicide attempt about 10 years ago when he cut his wrists. Never had any homicidal behavior denies suicidal or homicidal ideation recently. Goes to [employer] [facility] and takes trazodone and Seroquel.  Risk to Self: Suicidal Ideation: No Suicidal Intent: No Is patient at risk for suicide?: No Suicidal Plan?: No Access to Means: No What has been your use of drugs/alcohol within the last 12 months?: Alcohol How many times?: 0 Other Self Harm Risks: Reports of none Triggers for Past Attempts: None known Intentional Self Injurious Behavior: None Risk to Others: Homicidal Ideation: No Thoughts of Harm to Others: No Current Homicidal Intent: No Current Homicidal Plan: No Access to Homicidal Means: No Identified Victim: Reports of none History of harm to others?: No Assessment of Violence: None Noted Violent Behavior Description: Reports of none Does patient have access to weapons?: No Criminal Charges Pending?: Yes Describe Pending Criminal Charges: Traffic DWLR IMPAIRED REV & Traffic HIT/RUN LEAVE SCENE PROP DAM Does patient have a court date: Yes Court Date: 03/06/16 (03/27/2016) Prior Inpatient Therapy: Prior Inpatient Therapy: No Prior Therapy Dates: Reports of none Prior Therapy Facilty/Provider(s): Reports of none  Reason for Treatment: Reports of none Prior Outpatient Therapy: Prior Outpatient Therapy: Yes Prior Therapy Dates: Current Prior Therapy Facilty/Provider(s): [facility] Reason for Treatment: Bipolar (Medication Management) Does  patient have an ACCT team?: No Does patient have Intensive In-House Services?  : No Does patient have [facility] services? : No Does patient have P4CC services?: No  Past Medical History:  Past Medical History:  Diagnosis Date  . Anxiety   . Arthritis   . Asthma   . Bipolar 1 disorder ([place])   . Blind right eye   . Chronic back pain    Diverticulosis  . Chronic headaches   . Convulsion ([place])    once a month when stands up too quickly  . COPD (chronic obstructive pulmonary disease) ([place])   . DDD (degenerative disc disease), lumbar   . Diverticulitis   . GERD (gastroesophageal reflux disease)   . Glaucoma   . Hepatitis    hepatitis C  . Pneumonia   . PVD (peripheral vascular disease) ([facility])    lower extremities reddened and feet swollen  . Seasonal allergies   . Shortness of breath dyspnea     Past Surgical History:  Procedure Laterality Date  . EYE SURGERY Right   . INTUBATION-ENDOTRACHEAL WITH TRACHEOSTOMY STANDBY N/A 04/22/2015   Procedure: INTUBATION-ENDOTRACHEAL WITH TRACHEOSTOMY STANDBY;  Surgeon: Beverly Gust, MD;  Location: [facility] ORS;  Service: ENT;  Laterality: N/A;  . JOINT REPLACEMENT Left    Lt shoulder  . MANDIBLE SURGERY     Family History:  Family History  Problem Relation Age of Onset  . Breast cancer Mother   . Hypertension Father   . Diabetes Father    Family Psychiatric  History: Patient denies knowing of any family history of mental illness Social History:  History  Alcohol Use  . 3.6 oz/week  . 6 Cans of beer per week    Comment: occ     History  Drug Use No    Social History   Social History  . Marital status: Single    Spouse name: N/A  . Number of children: N/A  . Years of education: N/A   Social History Main Topics  . Smoking status: Current Every Day Smoker    Packs/day: 0.00    Years: 35.00    Types: Cigarettes  . Smokeless tobacco: Never Used     Comment: 2-3 cigs daily--02/27/16  . Alcohol use 3.6 oz/week    6 Cans of  beer per week     Comment: occ  . Drug use: No  . Sexual activity: Yes    Birth control/ protection: None   Other Topics Concern  . None   Social History Narrative  . None   Additional Social History:    Allergies:   Allergies  Allergen Reactions  . Ace Inhibitors Swelling    Labs:  Results for orders placed or performed during the hospital encounter of 03/05/16 (from the past 48 hour(s))  Comprehensive metabolic panel     Status: Abnormal   Collection Time: 03/05/16  1:23 AM  Result Value Ref Range   Sodium 138 135 - 145 mmol/L   Potassium 3.6 3.5 - 5.1 mmol/L   Chloride 105 101 - 111 mmol/L   CO2 24 22 - 32 mmol/L   Glucose, Bld 94 65 - 99 mg/dL   BUN 7 6 - 20 mg/dL   Creatinine, Ser 0.66 0.61 - 1.24 mg/dL   Calcium 8.3 (L) 8.9 - 10.3 mg/dL   Total Protein  6.8 6.5 - 8.1 g/dL   Albumin 3.9 3.5 - 5.0 g/dL   AST 55 (H) 15 - 41 U/L   ALT 21 17 - 63 U/L   Alkaline Phosphatase 63 38 - 126 U/L   Total Bilirubin 0.5 0.3 - 1.2 mg/dL   GFR calc non Af Amer >60 >60 mL/min   GFR calc Af Amer >60 >60 mL/min    Comment: (NOTE) The eGFR has been calculated using the CKD EPI equation. This calculation has not been validated in all clinical situations. eGFR's persistently <60 mL/min signify possible Chronic Kidney Disease.    Anion gap 9 5 - 15  Ethanol     Status: Abnormal   Collection Time: 03/05/16  1:23 AM  Result Value Ref Range   Alcohol, Ethyl (B) 309 (HH) <5 mg/dL    Comment: CRITICAL RESULT CALLED TO, READ BACK BY AND VERIFIED WITH RAQUEL DAVID ON 03/05/16 AT 0213 BY TLB        LOWEST DETECTABLE LIMIT FOR SERUM ALCOHOL IS 5 mg/dL FOR MEDICAL PURPOSES ONLY   CBC with Diff     Status: Abnormal   Collection Time: 03/05/16  1:23 AM  Result Value Ref Range   WBC 6.0 3.8 - 10.6 K/uL   RBC 4.51 4.40 - 5.90 MIL/uL   Hemoglobin 12.2 (L) 13.0 - 18.0 g/dL   HCT 37.1 (L) 40.0 - 52.0 %   MCV 82.2 80.0 - 100.0 fL   MCH 27.1 26.0 - 34.0 pg   MCHC 32.9 32.0 - 36.0 g/dL    RDW 19.6 (H) 11.5 - 14.5 %   Platelets 168 150 - 440 K/uL   Neutrophils Relative % 59 %   Neutro Abs 3.6 1.4 - 6.5 K/uL   Lymphocytes Relative 28 %   Lymphs Abs 1.7 1.0 - 3.6 K/uL   Monocytes Relative 11 %   Monocytes Absolute 0.7 0.2 - 1.0 K/uL   Eosinophils Relative 2 %   Eosinophils Absolute 0.1 0 - 0.7 K/uL   Basophils Relative 0 %   Basophils Absolute 0.0 0 - 0.1 K/uL  Urine Drug Screen, Qualitative ([facility] only)     Status: Abnormal   Collection Time: 03/05/16  1:23 AM  Result Value Ref Range   Tricyclic, Ur Screen NONE DETECTED NONE DETECTED   Amphetamines, Ur Screen NONE DETECTED NONE DETECTED   MDMA (Ecstasy)Ur Screen NONE DETECTED NONE DETECTED   Cocaine Metabolite,Ur Eleanor NONE DETECTED NONE DETECTED   Opiate, Ur Screen POSITIVE (A) NONE DETECTED   Phencyclidine (PCP) Ur S NONE DETECTED NONE DETECTED   Cannabinoid 50 Ng, Ur [place] NONE DETECTED NONE DETECTED   Barbiturates, Ur Screen NONE DETECTED NONE DETECTED   Benzodiazepine, Ur Scrn NONE DETECTED NONE DETECTED   Methadone Scn, Ur NONE DETECTED NONE DETECTED    Comment: (NOTE) 518  Tricyclics, urine               Cutoff 1000 ng/mL 200  Amphetamines, urine             Cutoff 1000 ng/mL 300  MDMA (Ecstasy), urine           Cutoff 500 ng/mL 400  Cocaine Metabolite, urine       Cutoff 300 ng/mL 500  Opiate, urine                   Cutoff 300 ng/mL 600  Phencyclidine (PCP), urine      Cutoff 25 ng/mL 700  Cannabinoid, urine  Cutoff 50 ng/mL 800  Barbiturates, urine             Cutoff 200 ng/mL 900  Benzodiazepine, urine           Cutoff 200 ng/mL 1000 Methadone, urine                Cutoff 300 ng/mL 1100 1200 The urine drug screen provides only a preliminary, unconfirmed 1300 analytical test result and should not be used for non-medical 1400 purposes. Clinical consideration and professional judgment should 1500 be applied to any positive drug screen result due to possible 1600 interfering substances. A more  specific alternate chemical method 1700 must be used in order to obtain a confirmed analytical result.  1800 Gas chromato graphy / mass spectrometry (GC/MS) is the preferred 1900 confirmatory method.     Current Facility-Administered Medications  Medication Dose Route Frequency Provider Last Rate Last Dose  . albuterol (PROVENTIL HFA;VENTOLIN HFA) 108 (90 Base) MCG/ACT inhaler 2 puff  2 puff Inhalation Q6H PRN Paulette Blanch, MD      . albuterol (PROVENTIL) (2.5 MG/3ML) 0.083% nebulizer solution 2.5 mg  2.5 mg Nebulization Q6H PRN Paulette Blanch, MD      . amoxicillin-clavulanate (AUGMENTIN) 875-125 MG per tablet 1 tablet  1 tablet Oral BID Paulette Blanch, MD   1 tablet at 03/05/16 1012  . fluticasone (FLONASE) 50 MCG/ACT nasal spray 2 spray  2 spray Each Nare PRN Paulette Blanch, MD      . gabapentin (NEURONTIN) capsule 900 mg  900 mg Oral TID Paulette Blanch, MD   900 mg at 03/05/16 1529  . latanoprost (XALATAN) 0.005 % ophthalmic solution 1 drop  1 drop Both Eyes QHS Paulette Blanch, MD      . LORazepam (ATIVAN) tablet 0-4 mg  0-4 mg Oral Q6H Paulette Blanch, MD   2 mg at 03/05/16 [phone]  . mometasone-formoterol (DULERA) 200-5 MCG/ACT inhaler 2 puff  2 puff Inhalation BID Lenis Noon, [facility]      . nicotine (NICODERM CQ - dosed in mg/24 hours) patch 21 mg  21 mg Transdermal Q24H Paulette Blanch, MD   21 mg at 03/05/16 [phone]  . pantoprazole (PROTONIX) EC tablet 40 mg  40 mg Oral Daily Paulette Blanch, MD   40 mg at 03/05/16 1012  . predniSONE (DELTASONE) tablet 50 mg  50 mg Oral Q breakfast Paulette Blanch, MD   50 mg at 03/05/16 1013  . QUEtiapine (SEROQUEL) tablet 300 mg  300 mg Oral QHS Paulette Blanch, MD      . sucralfate (CARAFATE) tablet 1 g  1 g Oral TID WC & HS Paulette Blanch, MD   1 g at 03/05/16 1205  . thiamine (VITAMIN B-1) tablet 100 mg  100 mg Oral Daily Paulette Blanch, MD   100 mg at 03/05/16 1012  . tiotropium (SPIRIVA) inhalation capsule 18 mcg  18 mcg Inhalation Daily Paulette Blanch, MD   18 mcg at 03/05/16 1138   Current  Outpatient Prescriptions  Medication Sig Dispense Refill  . acetaminophen (TYLENOL) 325 MG tablet Take 650 mg by mouth.    Marland Kitchen albuterol (PROAIR HFA) 108 (90 Base) MCG/ACT inhaler Inhale 2 puffs into the lungs every 6 (six) hours as needed for wheezing or shortness of breath. 1 Inhaler 11  . albuterol (PROVENTIL HFA;VENTOLIN HFA) 108 (90 Base) MCG/ACT inhaler Inhale 1 puff into the lungs every 6 (six) hours as needed  for wheezing or shortness of breath. 1 Inhaler 2  . albuterol (PROVENTIL) (2.5 MG/3ML) 0.083% nebulizer solution Take 3 mLs (2.5 mg total) by nebulization every 6 (six) hours as needed for wheezing or shortness of breath. 75 mL 2  . amoxicillin-clavulanate (AUGMENTIN) 875-125 MG tablet Take 1 tablet by mouth 2 (two) times daily. 20 tablet 0  . aspirin 81 MG chewable tablet Chew 81 mg by mouth.    . Carboxymethylcellulose Sodium (THERATEARS) 0.25 % SOLN Apply 2 drops to eye 4 (four) times daily.    . cetirizine (ZYRTEC) 10 MG tablet Take 10 mg by mouth daily.    . fluticasone (FLONASE) 50 MCG/ACT nasal spray Place 2 sprays into both nostrils as needed.     . gabapentin (NEURONTIN) 300 MG capsule Take 900 mg by mouth 3 (three) times daily.     Marland Kitchen latanoprost (XALATAN) 0.005 % ophthalmic solution Place 1 drop into both eyes at bedtime.    . mometasone-formoterol (DULERA) 200-5 MCG/ACT AERO Inhale 2 puffs into the lungs 2 (two) times daily. 1 Inhaler 11  . nicotine (NICODERM CQ - DOSED IN MG/24 HOURS) 21 mg/24hr patch Place 1 patch (21 mg total) onto the skin daily. 30 patch 2  . ondansetron (ZOFRAN) 4 MG tablet Take 1 tablet (4 mg total) by mouth every 8 (eight) hours as needed for nausea. 20 tablet 0  . oxyCODONE (OXY IR/ROXICODONE) 5 MG immediate release tablet Take 10 mg by mouth every 6 (six) hours as needed for severe pain.    . pantoprazole (PROTONIX) 40 MG tablet Take 40 mg by mouth daily.    Vladimir Faster Glycol-Propyl Glycol (SYSTANE) 0.4-0.3 % SOLN Apply 1 drop to eye.    .  polyethylene glycol (MIRALAX / GLYCOLAX) packet Take 17 g by mouth daily.    . predniSONE (DELTASONE) 50 MG tablet Take 1 tablet (50 mg total) by mouth daily with breakfast. 10 tablet 1  . QUEtiapine (SEROQUEL) 300 MG tablet Take 300 mg by mouth at bedtime.    . sucralfate (CARAFATE) 1 g tablet Take 1 g by mouth 4 (four) times daily -  with meals and at bedtime.    Marland Kitchen tiotropium (SPIRIVA) 18 MCG inhalation capsule Place 1 capsule (18 mcg total) into inhaler and inhale daily. 30 capsule 11  . tiZANidine (ZANAFLEX) 4 MG tablet Take 4 mg by mouth every 8 (eight) hours as needed for muscle spasms.    . traMADol (ULTRAM) 50 MG tablet Take 50 mg by mouth every 6 (six) hours as needed for moderate pain.    . traZODone (DESYREL) 150 MG tablet Take 150 mg by mouth at bedtime.    Marland Kitchen zolpidem (AMBIEN) 10 MG tablet Take 10 mg by mouth at bedtime as needed for sleep.      Musculoskeletal: Strength & Muscle Tone: decreased Gait & Station: ataxic Patient leans: Right  Psychiatric Specialty Exam: Physical Exam  Nursing note and vitals reviewed. Constitutional: He appears well-developed and well-nourished.  HENT:  Head: Normocephalic and atraumatic.  Eyes: Conjunctivae are normal. Pupils are equal, round, and reactive to light.  Neck: Normal range of motion.  Cardiovascular: Regular rhythm and normal heart sounds.   Respiratory: Effort normal. No respiratory distress.  GI: Soft.  Musculoskeletal: Normal range of motion.  Neurological: He is alert.  Skin: Skin is warm and dry.  Psychiatric: His speech is normal. His mood appears anxious. His affect is blunt. He is agitated. He expresses impulsivity. He does not exhibit a depressed mood.  He expresses no homicidal and no suicidal ideation. He exhibits abnormal recent memory.    Review of Systems  Constitutional: Negative.   HENT: Negative.   Eyes: Negative.   Respiratory: Negative.   Cardiovascular: Negative.   Gastrointestinal: Negative.    Musculoskeletal: Negative.   Skin: Negative.   Neurological: Negative.   Psychiatric/Behavioral: Positive for memory loss and substance abuse. Negative for depression, hallucinations and suicidal ideas. The patient is nervous/anxious and has insomnia.     Blood pressure (!) 141/98, pulse 98, temperature 98.2 F (36.8 C), temperature source Oral, resp. rate 20, SpO2 95 %.There is no height or weight on file to calculate BMI.  General Appearance: Disheveled  Eye Contact:  Minimal  Speech:  Garbled  Volume:  Decreased  Mood:  Angry  Affect:  Inappropriate  Thought Process:  Goal Directed  Orientation:  Full (Time, Place, and Person)  Thought Content:  Rumination  Suicidal Thoughts:  No  Homicidal Thoughts:  No  Memory:  Immediate;   Fair Recent;   Poor Remote;   Fair  Judgement:  Impaired  Insight:  Lacking  Psychomotor Activity:  Decreased  Concentration:  Concentration: Fair  Recall:  [employer] of Knowledge:  Fair  Language:  Fair  Akathisia:  No  Handed:  Right  AIMS (if indicated):     Assets:  Financial Resources/Insurance Housing Resilience Social Support  ADL's:  Intact  Cognition:  Impaired,  Mild  Sleep:        Treatment Plan Summary: Plan 55 year old man came to the hospital intoxicated. No longer intoxicated. Patient is lucid in his conversation and denies any suicidal or homicidal ideation. Not having frank manic symptoms. Mood symptoms appear to be related to substance abuse. Patient can be released from the emergency room and will follow-up at [facility]. No longer meets commitment criteria.  Disposition: Patient does not meet criteria for psychiatric inpatient admission.  Alethia Berthold, MD 03/05/2016 5:25 PM

## 2016-03-05 NOTE — BH Assessment (Signed)
Assessment Note  Perry CuminsGary L Ellsworth is an 55 y.o. male who presents to the ER due to his girlfriend/wife having concerns about his behaviors. According to nurses' notes, when the patient was at his home, he had thrown an oxygen tank and they feared he was going to blow the house up.   Per the report of the patient, he started drinking early and when he took his medications, "they didn't mixed well." He further explains he may have said something that would suggest he's having SI but he denies having any intent or means to do so. "I probably said it, just to be saying it but I'm not." He also states, he have a history of Bipolar and currently receives medication management with [facility]. He is been followed by Dr. Carman ChingMoffet (Psychiatrist).  Upon arrival to the ER his BAC was 309. UDS was positive for opiates.  He denies every being inpatient for mental health or substance abuse treatment. He has an upcoming court date on 03/07/2016 for a DWLR, being impaired and [employer]."  During the interview, he was calm, cooperative and pleasant. He denies SI/HI and AV/H.  Diagnosis: Bipolar  Past Medical History:  Past Medical History:  Diagnosis Date  . Anxiety   . Arthritis   . Asthma   . Bipolar 1 disorder (HCC)   . Blind right eye   . Chronic back pain    Diverticulosis  . Chronic headaches   . Convulsion (HCC)    once a month when stands up too quickly  . COPD (chronic obstructive pulmonary disease) (HCC)   . DDD (degenerative disc disease), lumbar   . Diverticulitis   . GERD (gastroesophageal reflux disease)   . Glaucoma   . Hepatitis    hepatitis C  . Pneumonia   . PVD (peripheral vascular disease) (HCC)    lower extremities reddened and feet swollen  . Seasonal allergies   . Shortness of breath dyspnea     Past Surgical History:  Procedure Laterality Date  . EYE SURGERY Right   . INTUBATION-ENDOTRACHEAL WITH TRACHEOSTOMY STANDBY N/A 04/22/2015   Procedure: INTUBATION-ENDOTRACHEAL WITH  TRACHEOSTOMY STANDBY;  Surgeon: Linus Salmonshapman McQueen, MD;  Location: [facility] ORS;  Service: ENT;  Laterality: N/A;  . JOINT REPLACEMENT Left    Lt shoulder  . MANDIBLE SURGERY      Family History:  Family History  Problem Relation Age of Onset  . Breast cancer Mother   . Hypertension Father   . Diabetes Father     Social History:  reports that he has been smoking Cigarettes.  He has been smoking about 0.00 packs per day for the past 35.00 years. He has never used smokeless tobacco. He reports that he drinks about 3.6 oz of alcohol per week . He reports that he does not use drugs.  Additional Social History:  Alcohol / Drug Use Pain Medications: See PTA Prescriptions: See PTA Over the Counter: See PTA History of alcohol / drug use?: Yes Longest period of sobriety (when/how long): Unknown, patient didn't know Negative Consequences of Use: Personal relationships, Work / School Withdrawal Symptoms:  (Reports of none) Substance #1 Name of Substance 1: Alcohol 1 - Age of First Use: 16 1 - Amount (size/oz): "6 or 9 beers." 1 - Frequency: "Two to three days a week." 1 - Duration: "Oh, I really can't say (Unable to quantify)." 1 - Last Use / Amount: 03/04/2016  CIWA: CIWA-Ar BP: 108/78 Pulse Rate: (!) 117 Nausea and Vomiting: no nausea  and no vomiting Tactile Disturbances: none Tremor: not visible, but can be felt fingertip to fingertip Auditory Disturbances: not present Paroxysmal Sweats: no sweat visible Visual Disturbances: not present Anxiety: mildly anxious Headache, Fullness in Head: none present Agitation: somewhat more than normal activity Orientation and Clouding of Sensorium: disoriented for data by no more than 2 calendar days CIWA-Ar Total: 5 COWS:    Allergies:  Allergies  Allergen Reactions  . Ace Inhibitors Swelling    Home Medications:  (Not in a hospital admission)  OB/GYN Status:  No LMP for male patient.  General Assessment Data Location of Assessment:  [facility] ED TTS Assessment: In system Is this a Tele or Face-to-Face Assessment?: Face-to-Face Is this an Initial Assessment or a Re-assessment for this encounter?: Initial Assessment Marital status: Single Maiden name: n/a Is patient pregnant?: No Pregnancy Status: No Living Arrangements: Spouse/significant other Can pt return to current living arrangement?: Yes Admission Status: Involuntary Is patient capable of signing voluntary admission?: No Referral Source: Self/Family/Friend Insurance type: Medicaid  Medical Screening Exam [facility] Walk-in ONLY) Medical Exam completed: Yes  Crisis Care Plan Living Arrangements: Spouse/significant other Legal Guardian: Other: (Reports of none) Name of Psychiatrist: Dr. Carman Ching Name of Therapist: Reports of none  Education Status Is patient currently in school?: No Current Grade: n/a Highest grade of school patient has completed: "I graduated [employer]." Name of school: n/a Contact person: n/a  Risk to self with the past 6 months Suicidal Ideation: No Has patient been a risk to self within the past 6 months prior to admission? : No Suicidal Intent: No Has patient had any suicidal intent within the past 6 months prior to admission? : No Is patient at risk for suicide?: No Suicidal Plan?: No Has patient had any suicidal plan within the past 6 months prior to admission? : No Access to Means: No What has been your use of drugs/alcohol within the last 12 months?: Alcohol Previous Attempts/Gestures: No How many times?: 0 Other Self Harm Risks: Reports of none Triggers for Past Attempts: None known Intentional Self Injurious Behavior: None Family Suicide History: No Recent stressful life event(s): Other (Comment) (Reports of none) Persecutory voices/beliefs?: No Depression: Yes Depression Symptoms: Feeling worthless/self pity, Feeling angry/irritable Substance abuse history and/or treatment for substance abuse?: Yes Suicide prevention  information given to non-admitted patients: Not applicable  Risk to Others within the past 6 months Homicidal Ideation: No Does patient have any lifetime risk of violence toward others beyond the six months prior to admission? : No Thoughts of Harm to Others: No Current Homicidal Intent: No Current Homicidal Plan: No Access to Homicidal Means: No Identified Victim: Reports of none History of harm to others?: No Assessment of Violence: None Noted Violent Behavior Description: Reports of none Does patient have access to weapons?: No Criminal Charges Pending?: Yes Describe Pending Criminal Charges: Traffic DWLR IMPAIRED REV & Traffic HIT/RUN LEAVE SCENE PROP DAM Does patient have a court date: Yes Court Date: 03/06/16 (03/27/2016) Is patient on probation?: No  Psychosis Hallucinations: None noted Delusions: None noted  Mental Status Report Appearance/Hygiene: In hospital gown, In scrubs, Unremarkable Eye Contact: Fair Motor Activity: Unable to assess Speech: Logical/coherent Level of Consciousness: Alert Mood: Sad, Pleasant Affect: Appropriate to circumstance Anxiety Level: Minimal Thought Processes: Coherent, Relevant Judgement: Unimpaired Orientation: Person, Place, Time, Situation Obsessive Compulsive Thoughts/Behaviors: None  Cognitive Functioning Concentration: Normal Memory: Recent Intact, Remote Intact IQ: Average Insight: Fair Impulse Control: Poor Appetite: Good Weight Loss: 0 Weight Gain: 0 Sleep: No Change  Total Hours of Sleep: 8 Vegetative Symptoms: None  ADLScreening [facility] Assessment Services) Patient's cognitive ability adequate to safely complete daily activities?: Yes Patient able to express need for assistance with ADLs?: Yes Independently performs ADLs?: Yes (appropriate for developmental age) (Per his report, his girlfriend helps him around the house)  Prior Inpatient Therapy Prior Inpatient Therapy: No Prior Therapy Dates: Reports of  none Prior Therapy Facilty/Provider(s): Reports of none Reason for Treatment: Reports of none  Prior Outpatient Therapy Prior Outpatient Therapy: Yes Prior Therapy Dates: Current Prior Therapy Facilty/Provider(s): [facility] Reason for Treatment: Bipolar (Medication Management) Does patient have an ACCT team?: No Does patient have Intensive In-House Services?  : No Does patient have [facility] services? : No Does patient have P4CC services?: No  ADL Screening (condition at time of admission) Patient's cognitive ability adequate to safely complete daily activities?: Yes Is the patient deaf or have difficulty hearing?: Yes Does the patient have difficulty seeing, even when wearing glasses/contacts?: Yes (Blind in the right eye) Does the patient have difficulty concentrating, remembering, or making decisions?: No Patient able to express need for assistance with ADLs?: Yes Does the patient have difficulty dressing or bathing?: No Independently performs ADLs?: Yes (appropriate for developmental age) (Per his report, his girlfriend helps him around the house) Does the patient have difficulty walking or climbing stairs?: Yes Weakness of Legs: Both Weakness of Arms/Hands: Left     Therapy Consults (therapy consults require a physician order) PT Evaluation Needed: No OT Evalulation Needed: No SLP Evaluation Needed: No Abuse/Neglect Assessment (Assessment to be complete while patient is alone) Physical Abuse: Denies Verbal Abuse: Denies Sexual Abuse: Denies Exploitation of patient/patient's resources: Denies Self-Neglect: Denies Values / Beliefs Cultural Requests During Hospitalization: None Spiritual Requests During Hospitalization: None Consults Spiritual Care Consult Needed: No Social Work Consult Needed: No Merchant navy officer (For Healthcare) Does patient have an advance directive?: No    Additional Information 1:1 In Past 12 Months?: No CIRT Risk: No Elopement Risk: No Does  patient have medical clearance?: Yes  Child/Adolescent Assessment Running Away Risk: Denies (Patient is an adult)  Disposition:  Disposition Initial Assessment Completed for this Encounter: Yes Disposition of Patient: Other dispositions (ER MD Ordered Psych Consult)  On Site Evaluation by:   Reviewed with Physician:    Lilyan Gilford MS, LCAS, LPC, NCC, CCSI Therapeutic Triage Specialist 03/05/2016 8:12 AM

## 2016-03-05 NOTE — ED Notes (Signed)
Rescinded IVC by Dr. Clapacs 

## 2016-03-05 NOTE — ED Provider Notes (Signed)
-----------------------------------------   3:52 PM on 03/05/2016 -----------------------------------------  Patient was seen by Dr. Toni Amendlapacs. Feels he is safe for outpatient follow up. IVC paperwork was rescinded by Dr. Toni Amendlapacs.    Phineas SemenGraydon Tavaris Eudy, MD 03/05/16 [phone]

## 2016-03-05 NOTE — ED Provider Notes (Signed)
[facility] Emergency Department Provider Note   ____________________________________________   First MD Initiated Contact with Patient 03/05/16 [phone]     (approximate)  I have reviewed the triage vital signs and the nursing notes.   HISTORY  Chief Complaint Psychiatric Evaluation    HPI Perry Bullock is a 55 y.o. male brought to the ED by police from home under IVCwith a chief complain of bipolar disorder and threatening behavior. Family reports patient through his oxygen tank because he was angry. They were concerned he was going to blow the house up. Patient states he may talk about suicide but he "isn't going to do anything". Voices concern that he has not yet received his inhalers while in the ED. Denies recent fever, chills, chest pain, shortness of breath, abdominal pain, nausea, vomiting, diarrhea. Denies active SI/HI/AH/VH. Denies recent travel or trauma. Nothing makes his symptoms better or worse.   Past Medical History:  Diagnosis Date  . Anxiety   . Arthritis   . Asthma   . Bipolar 1 disorder (HCC)   . Blind right eye   . Chronic back pain    Diverticulosis  . Chronic headaches   . Convulsion (HCC)    once a month when stands up too quickly  . COPD (chronic obstructive pulmonary disease) (HCC)   . DDD (degenerative disc disease), lumbar   . Diverticulitis   . GERD (gastroesophageal reflux disease)   . Glaucoma   . Hepatitis    hepatitis C  . Pneumonia   . PVD (peripheral vascular disease) (HCC)    lower extremities reddened and feet swollen  . Seasonal allergies   . Shortness of breath dyspnea     Patient Active Problem List   Diagnosis Date Noted  . Angioedema 04/22/2015  . Abscess of upper lobe of right lung without pneumonia (HCC) 04/03/2015  . COPD (chronic obstructive pulmonary disease) (HCC) 04/03/2015  . Hep C w/o coma, chronic (HCC) 04/03/2015  . Bipolar disorder (HCC) 04/03/2015    Past Surgical History:    Procedure Laterality Date  . EYE SURGERY Right   . INTUBATION-ENDOTRACHEAL WITH TRACHEOSTOMY STANDBY N/A 04/22/2015   Procedure: INTUBATION-ENDOTRACHEAL WITH TRACHEOSTOMY STANDBY;  Surgeon: Linus Salmons, MD;  Location: [facility] ORS;  Service: ENT;  Laterality: N/A;  . JOINT REPLACEMENT Left    Lt shoulder  . MANDIBLE SURGERY      Prior to Admission medications   Medication Sig Start Date End Date Taking? Authorizing Provider  acetaminophen (TYLENOL) 325 MG tablet Take 650 mg by mouth. 10/28/15   Historical Provider, MD  albuterol (PROAIR HFA) 108 (90 Base) MCG/ACT inhaler Inhale 2 puffs into the lungs every 6 (six) hours as needed for wheezing or shortness of breath. 02/27/16   Erin Fulling, MD  albuterol (PROVENTIL HFA;VENTOLIN HFA) 108 (90 Base) MCG/ACT inhaler Inhale 1 puff into the lungs every 6 (six) hours as needed for wheezing or shortness of breath. 12/25/15   Shane Crutch, MD  albuterol (PROVENTIL) (2.5 MG/3ML) 0.083% nebulizer solution Take 3 mLs (2.5 mg total) by nebulization every 6 (six) hours as needed for wheezing or shortness of breath. 12/25/15   Shane Crutch, MD  amoxicillin-clavulanate (AUGMENTIN) 875-125 MG tablet Take 1 tablet by mouth 2 (two) times daily. 02/27/16 03/08/16  Erin Fulling, MD  aspirin 81 MG chewable tablet Chew 81 mg by mouth. 11/07/15   Historical Provider, MD  Carboxymethylcellulose Sodium (THERATEARS) 0.25 % SOLN Apply 2 drops to eye 4 (four) times daily.  Historical Provider, MD  cetirizine (ZYRTEC) 10 MG tablet Take 10 mg by mouth daily.    Historical Provider, MD  fluticasone (FLONASE) 50 MCG/ACT nasal spray Place 2 sprays into both nostrils as needed.     Historical Provider, MD  gabapentin (NEURONTIN) 300 MG capsule Take 900 mg by mouth 3 (three) times daily.     Historical Provider, MD  latanoprost (XALATAN) 0.005 % ophthalmic solution Place 1 drop into both eyes at bedtime.    Historical Provider, MD  mometasone-formoterol (DULERA) 200-5  MCG/ACT AERO Inhale 2 puffs into the lungs 2 (two) times daily. 02/27/16   Erin FullingKurian Kasa, MD  nicotine (NICODERM CQ - DOSED IN MG/24 HOURS) 21 mg/24hr patch Place 1 patch (21 mg total) onto the skin daily. 11/22/15   Shane CrutchPradeep Ramachandran, MD  ondansetron (ZOFRAN) 4 MG tablet Take 1 tablet (4 mg total) by mouth every 8 (eight) hours as needed for nausea. 04/06/15   Enedina FinnerSona Patel, MD  oxyCODONE (OXY IR/ROXICODONE) 5 MG immediate release tablet Take 10 mg by mouth every 6 (six) hours as needed for severe pain.    Historical Provider, MD  pantoprazole (PROTONIX) 40 MG tablet Take 40 mg by mouth daily.    Historical Provider, MD  Polyethyl Glycol-Propyl Glycol (SYSTANE) 0.4-0.3 % SOLN Apply 1 drop to eye.    Historical Provider, MD  polyethylene glycol (MIRALAX / GLYCOLAX) packet Take 17 g by mouth daily.    Historical Provider, MD  predniSONE (DELTASONE) 50 MG tablet Take 1 tablet (50 mg total) by mouth daily with breakfast. 02/27/16   Erin FullingKurian Kasa, MD  QUEtiapine (SEROQUEL) 300 MG tablet Take 300 mg by mouth at bedtime.    Historical Provider, MD  sucralfate (CARAFATE) 1 g tablet Take 1 g by mouth 4 (four) times daily -  with meals and at bedtime.    Historical Provider, MD  tiotropium (SPIRIVA) 18 MCG inhalation capsule Place 1 capsule (18 mcg total) into inhaler and inhale daily. 02/27/16   Erin FullingKurian Kasa, MD  tiZANidine (ZANAFLEX) 4 MG tablet Take 4 mg by mouth every 8 (eight) hours as needed for muscle spasms.    Historical Provider, MD  traMADol (ULTRAM) 50 MG tablet Take 50 mg by mouth every 6 (six) hours as needed for moderate pain.    Historical Provider, MD  traZODone (DESYREL) 150 MG tablet Take 150 mg by mouth at bedtime.    Historical Provider, MD  zolpidem (AMBIEN) 10 MG tablet Take 10 mg by mouth at bedtime as needed for sleep.    Historical Provider, MD    Allergies Ace inhibitors  Family History  Problem Relation Age of Onset  . Breast cancer Mother   . Hypertension Father   . Diabetes Father      Social History Social History  Substance Use Topics  . Smoking status: Current Every Day Smoker    Packs/day: 0.00    Years: 35.00    Types: Cigarettes  . Smokeless tobacco: Never Used     Comment: 2-3 cigs daily--02/27/16  . Alcohol use 3.6 oz/week    6 Cans of beer per week     Comment: occ    Review of Systems  Constitutional: No fever/chills. Eyes: No visual changes. ENT: No sore throat. Cardiovascular: Denies chest pain. Respiratory: Positive for wheezing. Denies shortness of breath. Gastrointestinal: No abdominal pain.  No nausea, no vomiting.  No diarrhea.  No constipation. Genitourinary: Negative for dysuria. Musculoskeletal: Negative for back pain. Skin: Negative for rash. Neurological: Negative for  headaches, focal weakness or numbness. Psychiatric:Positive for aggressive behavior and vague suicidal thoughts.  10-point ROS otherwise negative.  ____________________________________________   PHYSICAL EXAM:  VITAL SIGNS: ED Triage Vitals  Enc Vitals Group     BP 03/05/16 0131 (!) 133/92     Pulse Rate 03/05/16 0131 (!) 107     Resp 03/05/16 0131 20     Temp 03/05/16 0131 98 F (36.7 C)     Temp Source 03/05/16 0131 Oral     SpO2 03/05/16 0131 92 %     Weight --      Height --      Head Circumference --      Peak Flow --      Pain Score 03/05/16 0118 4     Pain Loc --      Pain Edu? --      Excl. in GC? --     Constitutional: Alert and oriented. Well appearing and in no acute distress. Eyes: Conjunctivae are normal. PERRL. EOMI. Head: Atraumatic. Nose: No congestion/rhinnorhea. Mouth/Throat: Mucous membranes are moist.  Oropharynx non-erythematous. Neck: No stridor.   Cardiovascular: Normal rate, regular rhythm. Grossly normal heart sounds.  Good peripheral circulation. Respiratory: Normal respiratory effort.  No retractions. Lungs with mild wheezing at bases. Gastrointestinal: Soft and nontender. No distention. No abdominal bruits. No CVA  tenderness. Musculoskeletal: No lower extremity tenderness nor edema.  No joint effusions. Neurologic:  Normal speech and language. No gross focal neurologic deficits are appreciated. No gait instability. Skin:  Skin is warm, dry and intact. No rash noted. Psychiatric: Mood and affect are flat. Speech and behavior are normal.  ____________________________________________   LABS (all labs ordered are listed, but only abnormal results are displayed)  Labs Reviewed  COMPREHENSIVE METABOLIC PANEL - Abnormal; Notable for the following:       Result Value   Calcium 8.3 (*)    AST 55 (*)    All other components within normal limits  ETHANOL - Abnormal; Notable for the following:    Alcohol, Ethyl (B) 309 (*)    All other components within normal limits  CBC WITH DIFFERENTIAL/PLATELET - Abnormal; Notable for the following:    Hemoglobin 12.2 (*)    HCT 37.1 (*)    RDW 19.6 (*)    All other components within normal limits  URINE DRUG SCREEN, QUALITATIVE ([facility] ONLY) - Abnormal; Notable for the following:    Opiate, Ur Screen POSITIVE (*)    All other components within normal limits   ____________________________________________  EKG  None ____________________________________________  RADIOLOGY  None ____________________________________________   PROCEDURES  Procedure(s) performed: None  Procedures  Critical Care performed: No  ____________________________________________   INITIAL IMPRESSION / ASSESSMENT AND PLAN / ED COURSE  Pertinent labs & imaging results that were available during my care of the patient were reviewed by me and considered in my medical decision making (see chart for details).  55 year old male with a history of COPD on oxygen who presents under IVC for threatening behavior at home. Laboratory and urinalysis results remarkable for elevated EtOH. Will administer DuoNeb for mild wheezing heard on lung exam, reorder patient's home medications. Patient  will remain in the emergency department under IVC pending TTS and psychiatry consults  Clinical Course     ____________________________________________   FINAL CLINICAL IMPRESSION(S) / ED DIAGNOSES  Final diagnoses:  Bipolar disorder, current episode mixed, moderate (HCC)  Chronic obstructive pulmonary disease, unspecified COPD type (HCC)  Alcohol intoxication, uncomplicated (HCC)  NEW MEDICATIONS STARTED DURING THIS VISIT:  New Prescriptions   No medications on file     Note:  This document was prepared using Dragon voice recognition software and may include unintentional dictation errors.    Irean Hong, MD 03/05/16 [phone]

## 2016-03-05 NOTE — ED Notes (Signed)
IVC/ Consult pending 

## 2016-03-05 NOTE — Telephone Encounter (Signed)
LM for Perry Bullock (pt caregiver)

## 2016-03-06 ENCOUNTER — Ambulatory Visit: Admission: RE | Admit: 2016-03-06 | Payer: Medicaid Other | Source: Ambulatory Visit

## 2016-03-07 NOTE — Telephone Encounter (Signed)
LMOM for Marylene Landngela to call back.

## 2016-03-10 ENCOUNTER — Encounter: Payer: Self-pay | Admitting: *Deleted

## 2016-03-10 NOTE — Telephone Encounter (Signed)
LMOM for pt to call back. Due to multiple attempts will close encounter and mail letter to pt.

## 2016-03-12 ENCOUNTER — Telehealth: Payer: Self-pay

## 2016-03-12 NOTE — Telephone Encounter (Signed)
LMOVM to ask [facility] at 8:00am tomorrow 03/13/16 for a cxr prior to his 8:45am appt with DR. Will await call back.

## 2016-03-12 NOTE — Progress Notes (Signed)
[facility]* [facility]  Pulmonary Medicine     Date: 03/12/2016  MRN# [id number] Perry Bullock 15-Jun-1961   Perry FlockGary L Bullock is a 55 y.o. old male seen in follow up for chief complaint of  Chief Complaint  Patient presents with  . Follow-up    prod cough w/green mucus; SOB w/activity; chest tightness     HPI:   The patient is a 55 year old male with a history of lung abscess, seen about 2 weeks ago by Dr. Belia Hemankasa. At that time it was noted that CT findings consistent with previous right upper lobe abscess, as well as pleural thickening in the left lower lobe. He was also noted that his oxygen saturation was low down to 72% on room air. He was recently discharged from [facility] due to missed appointments, he is now within his 30 day follow-up. Since his last visit here, he was seen in the emergency room for aggressive behavior, thought to be related to substance abuse.  Review of CXR images today shows continued chronic changes. Today he notes that his breathing has significantly improved. He has been wearing 2L of oxygen. He continues to smoke about 5-10 cigs per day. He uses oxygen at home.  He is using dulera 2 to 3 times per day, spiriva once per day, and proair is once every few hours.   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 He has no hemoptysis.  He smokes about 10 cigs per day.   Today's OV,  He is taking spiriva twice daily, dulera 2 puffs twice daily. He has been taking more neb therapy and increased use of proair last several days Very weak, increased WOB and DOE +wheezing  +productive yellow wough       Review of CT chest images from 11/21/15, in comparison with previous from 07/05/15: There are scattered tree and bud changes in the right lung, along with bronchiectatic changes. There is a thick walled former abscess cavity seen in the right upper lobe. There is also scattered paratracheal lymphadenopathy.. In the left lung. There is a pleural thickening with  calcification seen. Overall, these changes are minimally changed from the Previous CT of the chest. Images reviewed 03/12/2016   Medication:   Outpatient Encounter Prescriptions as of 03/13/2016  Medication Sig  . acetaminophen (TYLENOL) 325 MG tablet Take 650 mg by mouth.  Marland Kitchen. albuterol (PROAIR HFA) 108 (90 Base) MCG/ACT inhaler Inhale 2 puffs into the lungs every 6 (six) hours as needed for wheezing or shortness of breath.  Marland Kitchen. albuterol (PROVENTIL HFA;VENTOLIN HFA) 108 (90 Base) MCG/ACT inhaler Inhale 1 puff into the lungs every 6 (six) hours as needed for wheezing or shortness of breath.  Marland Kitchen. albuterol (PROVENTIL) (2.5 MG/3ML) 0.083% nebulizer solution Take 3 mLs (2.5 mg total) by nebulization every 6 (six) hours as needed for wheezing or shortness of breath.  Marland Kitchen. aspirin 81 MG chewable tablet Chew 81 mg by mouth.  . Carboxymethylcellulose Sodium (THERATEARS) 0.25 % SOLN Apply 2 drops to eye 4 (four) times daily.  . cetirizine (ZYRTEC) 10 MG tablet Take 10 mg by mouth daily.  . fluticasone (FLONASE) 50 MCG/ACT nasal spray Place 2 sprays into both nostrils as needed.   . gabapentin (NEURONTIN) 300 MG capsule Take 900 mg by mouth 3 (three) times daily.   Marland Kitchen. latanoprost (XALATAN) 0.005 % ophthalmic solution Place 1 drop into both eyes at bedtime.  . mometasone-formoterol (DULERA) 200-5 MCG/ACT AERO Inhale 2 puffs into the lungs 2 (two) times daily.  .Marland Kitchen  nicotine (NICODERM CQ - DOSED IN MG/24 HOURS) 21 mg/24hr patch Place 1 patch (21 mg total) onto the skin daily.  . ondansetron (ZOFRAN) 4 MG tablet Take 1 tablet (4 mg total) by mouth every 8 (eight) hours as needed for nausea.  Marland Kitchen oxyCODONE (OXY IR/ROXICODONE) 5 MG immediate release tablet Take 10 mg by mouth every 6 (six) hours as needed for severe pain.  . pantoprazole (PROTONIX) 40 MG tablet Take 40 mg by mouth daily.  Bertram Gala Glycol-Propyl Glycol (SYSTANE) 0.4-0.3 % SOLN Apply 1 drop to eye.  . polyethylene glycol (MIRALAX / GLYCOLAX) packet Take 17  g by mouth daily.  . predniSONE (DELTASONE) 50 MG tablet Take 1 tablet (50 mg total) by mouth daily with breakfast.  . QUEtiapine (SEROQUEL) 300 MG tablet Take 300 mg by mouth at bedtime.  . sucralfate (CARAFATE) 1 g tablet Take 1 g by mouth 4 (four) times daily -  with meals and at bedtime.  Marland Kitchen tiotropium (SPIRIVA) 18 MCG inhalation capsule Place 1 capsule (18 mcg total) into inhaler and inhale daily.  Marland Kitchen tiZANidine (ZANAFLEX) 4 MG tablet Take 4 mg by mouth every 8 (eight) hours as needed for muscle spasms.  . traMADol (ULTRAM) 50 MG tablet Take 50 mg by mouth every 6 (six) hours as needed for moderate pain.  . traZODone (DESYREL) 150 MG tablet Take 150 mg by mouth at bedtime.  Marland Kitchen zolpidem (AMBIEN) 10 MG tablet Take 10 mg by mouth at bedtime as needed for sleep.   No facility-administered encounter medications on file as of 03/13/2016.      Allergies:  Ace inhibitors  Review of Systems: Gen:  Denies  fever, sweats. HEENT: Denies blurred vision. Cvc:  No dizziness, chest pain or heaviness Resp:   Denies hemoptysis Gi: Denies swallowing difficulty,  Gu:  Denies bladder incontinence, Ext:   No Joint pain, stiffness. Skin: No skin rash, easy bruising. Endoc:  No polyuria, polydipsia. Psych: No depression, insomnia. Other:  All other systems were reviewed and found to be negative other than what is mentioned in the HPI.   Physical Examination:   VS: BP 138/82 (BP Location: Left Arm, Cuff Size: Normal)   Pulse (!) 114   Ht 6' (1.829 m)   Wt 163 lb (73.9 kg)   SpO2 94%   BMI 22.11 kg/m    BP 138/82 (BP Location: Left Arm, Cuff Size: Normal)   Pulse (!) 114   Ht 6' (1.829 m)   Wt 163 lb (73.9 kg)   SpO2 94%   BMI 22.11 kg/m   General Appearance: dyspnea.  Neuro:without focal findings,  speech normal,  HEENT: PERRLA, EOM intact. Pulmonary: +wheezing.   CardiovascularNormal S1,S2.  No m/r/g.   Skin:   warm, no rash. Extremities: normal, no cyanosis, clubbing.    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 This appears better, recently completed the abx.  Sat checked at rest on RA was 91%, continue to use oxygen with activity, no need for it at rest.   -s/pCavitary lung lesion-most likely  from previous lung infection now with scarring Patient has been scheduled severl times for bronch but patient has NOT shown up -will repeat CT chest   -Pleural thickening and calcifications on the left side-- likely asbestosis.  Previous plan established by Dr. Nicholos Johns -No history of asbestos exposure, he also has reduced lung volume on the left side due to asbestosis.  We'll perform bronchoscopy, though given lack of progress in most recent CT scan, doubt mesothelioma. 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  -Nicotine abuse.  -Discussed the importance of smoking cessation   Discussed that his future pulmonary care will need to be arranged through Dr. Welton Flakes who was his previous pulmonologist and he is planning on seeing him again. We will be happy to provide any requested medical records.

## 2016-03-13 ENCOUNTER — Ambulatory Visit
Admission: RE | Admit: 2016-03-13 | Discharge: 2016-03-13 | Disposition: A | Discharge status: Home / Self Care | Payer: Medicaid Other | Source: Ambulatory Visit | Attending: Internal Medicine | Admitting: Internal Medicine

## 2016-03-13 ENCOUNTER — Ambulatory Visit
Admission: RE | Admit: 2016-03-13 | Discharge: 2016-03-13 | Disposition: A | Discharge status: Home / Self Care | Payer: Medicaid Other | Source: Ambulatory Visit | Attending: Pulmonary Disease | Admitting: Pulmonary Disease

## 2016-03-13 ENCOUNTER — Encounter: Payer: Self-pay | Admitting: Internal Medicine

## 2016-03-13 ENCOUNTER — Ambulatory Visit (INDEPENDENT_AMBULATORY_CARE_PROVIDER_SITE_OTHER): Payer: Medicaid Other | Admitting: Internal Medicine

## 2016-03-13 ENCOUNTER — Ambulatory Visit: Payer: Medicaid Other | Admitting: Internal Medicine

## 2016-03-13 VITALS — BP 138/82 | HR 114 | Ht 72.0 in | Wt 163.0 lb

## 2016-03-13 DIAGNOSIS — J851 Abscess of lung with pneumonia: Secondary | ICD-10-CM

## 2016-03-13 DIAGNOSIS — J941 Fibrothorax: Secondary | ICD-10-CM | POA: Diagnosis not present

## 2016-03-13 DIAGNOSIS — R918 Other nonspecific abnormal finding of lung field: Secondary | ICD-10-CM | POA: Insufficient documentation

## 2016-03-13 DIAGNOSIS — F17210 Nicotine dependence, cigarettes, uncomplicated: Secondary | ICD-10-CM

## 2016-03-13 DIAGNOSIS — R911 Solitary pulmonary nodule: Secondary | ICD-10-CM | POA: Diagnosis not present

## 2016-03-13 DIAGNOSIS — J9601 Acute respiratory failure with hypoxia: Secondary | ICD-10-CM

## 2016-03-13 DIAGNOSIS — J449 Chronic obstructive pulmonary disease, unspecified: Secondary | ICD-10-CM | POA: Diagnosis not present

## 2016-03-13 NOTE — Patient Instructions (Signed)
--  Please call Dr. Milta DeitersKhan's office for future pulmonary care.   --Smoking is going to worsen your breathing and overall health.

## 2016-05-27 ENCOUNTER — Other Ambulatory Visit: Payer: Self-pay | Admitting: Internal Medicine

## 2016-05-27 DIAGNOSIS — J920 Pleural plaque with presence of asbestos: Secondary | ICD-10-CM

## 2016-06-11 ENCOUNTER — Ambulatory Visit
Admission: RE | Admit: 2016-06-11 | Discharge: 2016-06-11 | Disposition: A | Discharge status: Home / Self Care | Payer: Medicaid Other | Source: Ambulatory Visit | Attending: Internal Medicine | Admitting: Internal Medicine

## 2016-06-11 DIAGNOSIS — F17200 Nicotine dependence, unspecified, uncomplicated: Secondary | ICD-10-CM | POA: Diagnosis not present

## 2016-06-11 DIAGNOSIS — K760 Fatty (change of) liver, not elsewhere classified: Secondary | ICD-10-CM | POA: Diagnosis not present

## 2016-06-11 DIAGNOSIS — J984 Other disorders of lung: Secondary | ICD-10-CM | POA: Insufficient documentation

## 2016-06-11 DIAGNOSIS — I700 Atherosclerosis of aorta: Secondary | ICD-10-CM | POA: Diagnosis not present

## 2016-06-11 DIAGNOSIS — J920 Pleural plaque with presence of asbestos: Secondary | ICD-10-CM | POA: Diagnosis present

## 2016-06-11 DIAGNOSIS — R918 Other nonspecific abnormal finding of lung field: Secondary | ICD-10-CM | POA: Insufficient documentation

## 2016-06-11 MED ORDER — IOPAMIDOL (ISOVUE-300) INJECTION 61%
75.0000 mL | Freq: Once | INTRAVENOUS | Status: AC | PRN
  Administered 2016-06-11: 75 mL via INTRAVENOUS

## 2016-09-16 ENCOUNTER — Emergency Department: Payer: Medicaid Other

## 2016-09-16 ENCOUNTER — Emergency Department
Admission: EM | Admit: 2016-09-16 | Discharge: 2016-09-16 | Disposition: A | Discharge status: Other Acute Inpatient Hospital | Payer: Medicaid Other | Attending: Emergency Medicine | Admitting: Emergency Medicine

## 2016-09-16 DIAGNOSIS — Z7982 Long term (current) use of aspirin: Secondary | ICD-10-CM | POA: Insufficient documentation

## 2016-09-16 DIAGNOSIS — X16XXXA Contact with hot heating appliances, radiators and pipes, initial encounter: Secondary | ICD-10-CM | POA: Diagnosis not present

## 2016-09-16 DIAGNOSIS — T2135XA Burn of third degree of buttock, initial encounter: Secondary | ICD-10-CM | POA: Diagnosis not present

## 2016-09-16 DIAGNOSIS — Y999 Unspecified external cause status: Secondary | ICD-10-CM | POA: Insufficient documentation

## 2016-09-16 DIAGNOSIS — Z79899 Other long term (current) drug therapy: Secondary | ICD-10-CM | POA: Diagnosis not present

## 2016-09-16 DIAGNOSIS — J441 Chronic obstructive pulmonary disease with (acute) exacerbation: Secondary | ICD-10-CM | POA: Insufficient documentation

## 2016-09-16 DIAGNOSIS — A419 Sepsis, unspecified organism: Secondary | ICD-10-CM | POA: Insufficient documentation

## 2016-09-16 DIAGNOSIS — F17210 Nicotine dependence, cigarettes, uncomplicated: Secondary | ICD-10-CM | POA: Diagnosis not present

## 2016-09-16 DIAGNOSIS — J45909 Unspecified asthma, uncomplicated: Secondary | ICD-10-CM | POA: Insufficient documentation

## 2016-09-16 DIAGNOSIS — T2105XA Burn of unspecified degree of buttock, initial encounter: Secondary | ICD-10-CM | POA: Diagnosis present

## 2016-09-16 DIAGNOSIS — T300 Burn of unspecified body region, unspecified degree: Secondary | ICD-10-CM

## 2016-09-16 DIAGNOSIS — Z23 Encounter for immunization: Secondary | ICD-10-CM | POA: Insufficient documentation

## 2016-09-16 DIAGNOSIS — J189 Pneumonia, unspecified organism: Secondary | ICD-10-CM | POA: Diagnosis not present

## 2016-09-16 DIAGNOSIS — Y929 Unspecified place or not applicable: Secondary | ICD-10-CM | POA: Insufficient documentation

## 2016-09-16 DIAGNOSIS — Y9389 Activity, other specified: Secondary | ICD-10-CM | POA: Diagnosis not present

## 2016-09-16 LAB — URINALYSIS, COMPLETE (UACMP) WITH MICROSCOPIC
BACTERIA UA: NONE SEEN
BILIRUBIN URINE: NEGATIVE
Glucose, UA: NEGATIVE mg/dL
HGB URINE DIPSTICK: NEGATIVE
Ketones, ur: NEGATIVE mg/dL
Leukocytes, UA: NEGATIVE
NITRITE: NEGATIVE
Protein, ur: NEGATIVE mg/dL
SPECIFIC GRAVITY, URINE: 1.014 (ref 1.005–1.030)
Squamous Epithelial / LPF: NONE SEEN
pH: 6 (ref 5.0–8.0)

## 2016-09-16 LAB — CBC WITH DIFFERENTIAL/PLATELET
BASOS PCT: 0 %
Basophils Absolute: 0 10*3/uL (ref 0–0.1)
Eosinophils Absolute: 0.1 10*3/uL (ref 0–0.7)
Eosinophils Relative: 1 %
HEMATOCRIT: 35 % — AB (ref 40.0–52.0)
HEMOGLOBIN: 12.1 g/dL — AB (ref 13.0–18.0)
LYMPHS ABS: 1.1 10*3/uL (ref 1.0–3.6)
Lymphocytes Relative: 12 %
MCH: 35.2 pg — AB (ref 26.0–34.0)
MCHC: 34.5 g/dL (ref 32.0–36.0)
MCV: 102.1 fL — ABNORMAL HIGH (ref 80.0–100.0)
MONOS PCT: 14 %
Monocytes Absolute: 1.3 10*3/uL — ABNORMAL HIGH (ref 0.2–1.0)
NEUTROS ABS: 7.1 10*3/uL — AB (ref 1.4–6.5)
NEUTROS PCT: 73 %
Platelets: 236 10*3/uL (ref 150–440)
RBC: 3.43 MIL/uL — AB (ref 4.40–5.90)
RDW: 13.4 % (ref 11.5–14.5)
WBC: 9.7 10*3/uL (ref 3.8–10.6)

## 2016-09-16 LAB — COMPREHENSIVE METABOLIC PANEL
ALT: 20 U/L (ref 17–63)
ANION GAP: 6 (ref 5–15)
AST: 34 U/L (ref 15–41)
Albumin: 2.6 g/dL — ABNORMAL LOW (ref 3.5–5.0)
Alkaline Phosphatase: 158 U/L — ABNORMAL HIGH (ref 38–126)
BUN: 6 mg/dL (ref 6–20)
CHLORIDE: 105 mmol/L (ref 101–111)
CO2: 25 mmol/L (ref 22–32)
Calcium: 8.1 mg/dL — ABNORMAL LOW (ref 8.9–10.3)
Creatinine, Ser: 0.6 mg/dL — ABNORMAL LOW (ref 0.61–1.24)
GFR calc Af Amer: >60 mL/min (ref >60)
Glucose, Bld: 105 mg/dL — ABNORMAL HIGH (ref 65–99)
Potassium: 4.1 mmol/L (ref 3.5–5.1)
SODIUM: 136 mmol/L (ref 135–145)
Total Bilirubin: 0.5 mg/dL (ref 0.3–1.2)
Total Protein: 6 g/dL — ABNORMAL LOW (ref 6.5–8.1)

## 2016-09-16 LAB — LACTIC ACID, PLASMA: LACTIC ACID, VENOUS: 3.2 mmol/L — AB (ref 0.5–1.9)

## 2016-09-16 MED ORDER — METHYLPREDNISOLONE SODIUM SUCC 125 MG IJ SOLR
125.0000 mg | Freq: Once | INTRAMUSCULAR | Status: AC
  Administered 2016-09-16: 125 mg via INTRAVENOUS
  Filled 2016-09-16 (×2): qty 2

## 2016-09-16 MED ORDER — IPRATROPIUM-ALBUTEROL 0.5-2.5 (3) MG/3ML IN SOLN
9.0000 mL | Freq: Once | RESPIRATORY_TRACT | Status: AC
  Administered 2016-09-16: 9 mL via RESPIRATORY_TRACT

## 2016-09-16 MED ORDER — TETANUS-DIPHTH-ACELL PERTUSSIS 5-2.5-18.5 LF-MCG/0.5 IM SUSP
0.5000 mL | Freq: Once | INTRAMUSCULAR | Status: AC
  Administered 2016-09-16: 0.5 mL via INTRAMUSCULAR
  Filled 2016-09-16 (×2): qty 0.5

## 2016-09-16 MED ORDER — IPRATROPIUM-ALBUTEROL 0.5-2.5 (3) MG/3ML IN SOLN
RESPIRATORY_TRACT | Status: AC
  Filled 2016-09-16: qty 3

## 2016-09-16 MED ORDER — IPRATROPIUM-ALBUTEROL 0.5-2.5 (3) MG/3ML IN SOLN
RESPIRATORY_TRACT | Status: AC
  Filled 2016-09-16: qty 6

## 2016-09-16 MED ORDER — PIPERACILLIN-TAZOBACTAM 3.375 G IVPB 30 MIN
3.3750 g | Freq: Once | INTRAVENOUS | Status: AC
  Administered 2016-09-16: 3.375 g via INTRAVENOUS
  Filled 2016-09-16 (×2): qty 50

## 2016-09-16 MED ORDER — VANCOMYCIN HCL IN DEXTROSE 1-5 GM/200ML-% IV SOLN
1000.0000 mg | Freq: Once | INTRAVENOUS | Status: AC
  Administered 2016-09-16: 1000 mg via INTRAVENOUS
  Filled 2016-09-16 (×2): qty 200

## 2016-09-16 MED ORDER — SODIUM CHLORIDE 0.9 % IV BOLUS (SEPSIS)
1000.0000 mL | Freq: Once | INTRAVENOUS | Status: AC
  Administered 2016-09-16: 1000 mL via INTRAVENOUS
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
## 2016-09-16 NOTE — ED Notes (Signed)
Critical lactic reported to Dr Pershing ProudSchaevitz
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
## 2016-09-17 DIAGNOSIS — F319 Bipolar disorder, unspecified: Secondary | ICD-10-CM | POA: Insufficient documentation

## 2016-09-17 DIAGNOSIS — J189 Pneumonia, unspecified organism: Secondary | ICD-10-CM | POA: Insufficient documentation

## 2016-09-17 DIAGNOSIS — B1920 Unspecified viral hepatitis C without hepatic coma: Secondary | ICD-10-CM | POA: Insufficient documentation

## 2016-09-17 DIAGNOSIS — T2135XA Burn of third degree of buttock, initial encounter: Secondary | ICD-10-CM | POA: Insufficient documentation

## 2016-09-17 LAB — BLOOD CULTURE ID PANEL (REFLEXED)
Acinetobacter baumannii: NOT DETECTED
CANDIDA GLABRATA: NOT DETECTED
CANDIDA KRUSEI: NOT DETECTED
CANDIDA TROPICALIS: NOT DETECTED
Candida albicans: NOT DETECTED
Candida parapsilosis: NOT DETECTED
ENTEROBACTER CLOACAE COMPLEX: NOT DETECTED
ENTEROBACTERIACEAE SPECIES: NOT DETECTED
ESCHERICHIA COLI: NOT DETECTED
Enterococcus species: NOT DETECTED
Haemophilus influenzae: NOT DETECTED
KLEBSIELLA PNEUMONIAE: NOT DETECTED
Klebsiella oxytoca: NOT DETECTED
LISTERIA MONOCYTOGENES: NOT DETECTED
Methicillin resistance: DETECTED — AB
Neisseria meningitidis: NOT DETECTED
PROTEUS SPECIES: NOT DETECTED
Pseudomonas aeruginosa: NOT DETECTED
STAPHYLOCOCCUS SPECIES: DETECTED — AB
STREPTOCOCCUS AGALACTIAE: NOT DETECTED
STREPTOCOCCUS SPECIES: NOT DETECTED
Serratia marcescens: NOT DETECTED
Staphylococcus aureus (BCID): NOT DETECTED
Streptococcus pneumoniae: NOT DETECTED
Streptococcus pyogenes: NOT DETECTED

## 2016-09-17 LAB — URINE CULTURE: Culture: NO GROWTH

## 2016-09-20 LAB — CULTURE, BLOOD (ROUTINE X 2)

## 2016-09-21 LAB — CULTURE, BLOOD (ROUTINE X 2): Culture: NO GROWTH

## 2017-01-15 ENCOUNTER — Inpatient Hospital Stay
Admission: EM | Admit: 2017-01-15 | Discharge: 2017-01-19 | DRG: 481 | Disposition: A | Discharge status: Home Health | Payer: Medicaid Other | Attending: Internal Medicine | Admitting: Internal Medicine

## 2017-01-15 ENCOUNTER — Emergency Department: Payer: Medicaid Other

## 2017-01-15 DIAGNOSIS — B182 Chronic viral hepatitis C: Secondary | ICD-10-CM | POA: Diagnosis present

## 2017-01-15 DIAGNOSIS — H5461 Unqualified visual loss, right eye, normal vision left eye: Secondary | ICD-10-CM | POA: Diagnosis present

## 2017-01-15 DIAGNOSIS — H409 Unspecified glaucoma: Secondary | ICD-10-CM | POA: Diagnosis present

## 2017-01-15 DIAGNOSIS — K219 Gastro-esophageal reflux disease without esophagitis: Secondary | ICD-10-CM | POA: Diagnosis present

## 2017-01-15 DIAGNOSIS — Z7982 Long term (current) use of aspirin: Secondary | ICD-10-CM | POA: Diagnosis not present

## 2017-01-15 DIAGNOSIS — F10120 Alcohol abuse with intoxication, uncomplicated: Secondary | ICD-10-CM | POA: Diagnosis present

## 2017-01-15 DIAGNOSIS — D62 Acute posthemorrhagic anemia: Secondary | ICD-10-CM | POA: Diagnosis not present

## 2017-01-15 DIAGNOSIS — G8929 Other chronic pain: Secondary | ICD-10-CM | POA: Diagnosis present

## 2017-01-15 DIAGNOSIS — J449 Chronic obstructive pulmonary disease, unspecified: Secondary | ICD-10-CM | POA: Diagnosis present

## 2017-01-15 DIAGNOSIS — S72141A Displaced intertrochanteric fracture of right femur, initial encounter for closed fracture: Principal | ICD-10-CM | POA: Diagnosis present

## 2017-01-15 DIAGNOSIS — Z96612 Presence of left artificial shoulder joint: Secondary | ICD-10-CM | POA: Diagnosis present

## 2017-01-15 DIAGNOSIS — S72001A Fracture of unspecified part of neck of right femur, initial encounter for closed fracture: Secondary | ICD-10-CM

## 2017-01-15 DIAGNOSIS — F319 Bipolar disorder, unspecified: Secondary | ICD-10-CM | POA: Diagnosis present

## 2017-01-15 DIAGNOSIS — F1120 Opioid dependence, uncomplicated: Secondary | ICD-10-CM | POA: Diagnosis present

## 2017-01-15 DIAGNOSIS — F10920 Alcohol use, unspecified with intoxication, uncomplicated: Secondary | ICD-10-CM

## 2017-01-15 DIAGNOSIS — Z419 Encounter for procedure for purposes other than remedying health state, unspecified: Secondary | ICD-10-CM

## 2017-01-15 DIAGNOSIS — I739 Peripheral vascular disease, unspecified: Secondary | ICD-10-CM | POA: Diagnosis present

## 2017-01-15 DIAGNOSIS — W19XXXA Unspecified fall, initial encounter: Secondary | ICD-10-CM

## 2017-01-15 DIAGNOSIS — F419 Anxiety disorder, unspecified: Secondary | ICD-10-CM | POA: Diagnosis present

## 2017-01-15 DIAGNOSIS — F17210 Nicotine dependence, cigarettes, uncomplicated: Secondary | ICD-10-CM | POA: Diagnosis present

## 2017-01-15 DIAGNOSIS — M25551 Pain in right hip: Secondary | ICD-10-CM | POA: Diagnosis present

## 2017-01-15 DIAGNOSIS — W1830XA Fall on same level, unspecified, initial encounter: Secondary | ICD-10-CM | POA: Diagnosis present

## 2017-01-15 DIAGNOSIS — M549 Dorsalgia, unspecified: Secondary | ICD-10-CM | POA: Diagnosis present

## 2017-01-15 DIAGNOSIS — Z79899 Other long term (current) drug therapy: Secondary | ICD-10-CM | POA: Diagnosis not present

## 2017-01-15 LAB — BASIC METABOLIC PANEL
Anion gap: 11 (ref 5–15)
BUN: 8 mg/dL (ref 6–20)
CO2: 25 mmol/L (ref 22–32)
CREATININE: 0.7 mg/dL (ref 0.61–1.24)
Calcium: 8.7 mg/dL — ABNORMAL LOW (ref 8.9–10.3)
Chloride: 95 mmol/L — ABNORMAL LOW (ref 101–111)
GFR calc non Af Amer: >60 mL/min (ref >60)
Glucose, Bld: 95 mg/dL (ref 65–99)
POTASSIUM: 4.2 mmol/L (ref 3.5–5.1)
Sodium: 131 mmol/L — ABNORMAL LOW (ref 135–145)

## 2017-01-15 LAB — CBC WITH DIFFERENTIAL/PLATELET
Basophils Absolute: 0 10*3/uL (ref 0–0.1)
Basophils Relative: 0 %
Eosinophils Absolute: 0 10*3/uL (ref 0–0.7)
Eosinophils Relative: 0 %
HEMATOCRIT: 35.5 % — AB (ref 40.0–52.0)
Hemoglobin: 12.2 g/dL — ABNORMAL LOW (ref 13.0–18.0)
LYMPHS ABS: 0.9 10*3/uL — AB (ref 1.0–3.6)
Lymphocytes Relative: 8 %
MCH: 33.9 pg (ref 26.0–34.0)
MCHC: 34.2 g/dL (ref 32.0–36.0)
MCV: 99 fL (ref 80.0–100.0)
MONOS PCT: 5 %
Monocytes Absolute: 0.5 10*3/uL (ref 0.2–1.0)
NEUTROS ABS: 9.2 10*3/uL — AB (ref 1.4–6.5)
NEUTROS PCT: 87 %
Platelets: 275 10*3/uL (ref 150–440)
RBC: 3.59 MIL/uL — AB (ref 4.40–5.90)
RDW: 14.2 % (ref 11.5–14.5)
WBC: 10.7 10*3/uL — ABNORMAL HIGH (ref 3.8–10.6)

## 2017-01-15 LAB — TYPE AND SCREEN
ABO/RH(D): B POS
ANTIBODY SCREEN: NEGATIVE

## 2017-01-15 LAB — PROTIME-INR
INR: 0.96
Prothrombin Time: 12.8 seconds (ref 11.4–15.2)

## 2017-01-15 LAB — APTT: aPTT: 25 seconds (ref 24–36)

## 2017-01-15 LAB — ETHANOL: Alcohol, Ethyl (B): 109 mg/dL — ABNORMAL HIGH (ref <5)

## 2017-01-15 MED ORDER — SUCRALFATE 1 G PO TABS
1.0000 g | ORAL_TABLET | Freq: Three times a day (TID) | ORAL | Status: DC
  Administered 2017-01-16 – 2017-01-19 (×11): 1 g via ORAL
  Filled 2017-01-15 (×11): qty 1

## 2017-01-15 MED ORDER — POLYETHYL GLYCOL-PROPYL GLYCOL 0.4-0.3 % OP SOLN
1.0000 [drp] | Freq: Every day | OPHTHALMIC | Status: DC
Start: 2017-01-16 — End: 2017-01-15

## 2017-01-15 MED ORDER — MORPHINE SULFATE (PF) 4 MG/ML IV SOLN
4.0000 mg | Freq: Once | INTRAVENOUS | Status: AC
Start: 2017-01-15 — End: 2017-01-15
  Administered 2017-01-15: 4 mg via INTRAVENOUS
  Filled 2017-01-15: qty 1

## 2017-01-15 MED ORDER — SENNOSIDES-DOCUSATE SODIUM 8.6-50 MG PO TABS: 1.0000 | ORAL_TABLET | Freq: Every evening | ORAL | Status: DC | PRN

## 2017-01-15 MED ORDER — HYDROCODONE-ACETAMINOPHEN 5-325 MG PO TABS: 1.0000 | ORAL_TABLET | Freq: Four times a day (QID) | ORAL | Status: DC | PRN

## 2017-01-15 MED ORDER — MORPHINE SULFATE (PF) 2 MG/ML IV SOLN
0.5000 mg | INTRAVENOUS | Status: DC | PRN
Start: 2017-01-15 — End: 2017-01-16
  Administered 2017-01-16 (×3): 0.5 mg via INTRAVENOUS
  Filled 2017-01-15 (×4): qty 1

## 2017-01-15 MED ORDER — GABAPENTIN 300 MG PO CAPS
900.0000 mg | ORAL_CAPSULE | Freq: Three times a day (TID) | ORAL | Status: DC
  Administered 2017-01-16 – 2017-01-19 (×10): 900 mg via ORAL
  Filled 2017-01-15 (×10): qty 3

## 2017-01-15 MED ORDER — ONDANSETRON HCL 4 MG/2ML IJ SOLN
4.0000 mg | Freq: Once | INTRAMUSCULAR | Status: AC
  Administered 2017-01-15: 4 mg via INTRAVENOUS
  Filled 2017-01-15: qty 2

## 2017-01-15 MED ORDER — BISACODYL 5 MG PO TBEC: 5.0000 mg | DELAYED_RELEASE_TABLET | Freq: Every day | ORAL | Status: DC | PRN

## 2017-01-15 MED ORDER — LORATADINE 10 MG PO TABS
10.0000 mg | ORAL_TABLET | Freq: Every day | ORAL | Status: DC
  Administered 2017-01-17 – 2017-01-19 (×3): 10 mg via ORAL
  Filled 2017-01-15 (×3): qty 1

## 2017-01-15 MED ORDER — TRAZODONE HCL 50 MG PO TABS
150.0000 mg | ORAL_TABLET | Freq: Every day | ORAL | Status: DC
  Administered 2017-01-16 – 2017-01-18 (×3): 150 mg via ORAL
  Filled 2017-01-15 (×3): qty 1

## 2017-01-15 MED ORDER — FLUTICASONE PROPIONATE 50 MCG/ACT NA SUSP
2.0000 | Freq: Every day | NASAL | Status: DC
  Filled 2017-01-15: qty 16

## 2017-01-15 MED ORDER — HEPARIN SODIUM (PORCINE) 5000 UNIT/ML IJ SOLN
5000.0000 [IU] | Freq: Three times a day (TID) | INTRAMUSCULAR | Status: DC
  Administered 2017-01-16: 5000 [IU] via SUBCUTANEOUS
  Filled 2017-01-15: qty 1

## 2017-01-15 MED ORDER — NICOTINE 21 MG/24HR TD PT24
21.0000 mg | MEDICATED_PATCH | TRANSDERMAL | Status: DC
  Administered 2017-01-16 – 2017-01-18 (×4): 21 mg via TRANSDERMAL
  Filled 2017-01-15 (×4): qty 1

## 2017-01-15 MED ORDER — ZOLPIDEM TARTRATE 5 MG PO TABS
10.0000 mg | ORAL_TABLET | Freq: Every evening | ORAL | Status: DC | PRN
  Administered 2017-01-16 – 2017-01-18 (×4): 10 mg via ORAL
  Filled 2017-01-15 (×4): qty 2

## 2017-01-15 MED ORDER — LATANOPROST 0.005 % OP SOLN
1.0000 [drp] | Freq: Every day | OPHTHALMIC | Status: DC
  Filled 2017-01-15: qty 2.5

## 2017-01-15 MED ORDER — MOMETASONE FURO-FORMOTEROL FUM 200-5 MCG/ACT IN AERO
2.0000 | INHALATION_SPRAY | Freq: Two times a day (BID) | RESPIRATORY_TRACT | Status: DC
Start: 2017-01-15 — End: 2017-01-19
  Administered 2017-01-16 – 2017-01-19 (×7): 2 via RESPIRATORY_TRACT
  Filled 2017-01-15: qty 8.8

## 2017-01-15 MED ORDER — ALBUTEROL SULFATE (2.5 MG/3ML) 0.083% IN NEBU
2.5000 mg | INHALATION_SOLUTION | Freq: Four times a day (QID) | RESPIRATORY_TRACT | Status: DC | PRN
  Filled 2017-01-15 (×2): qty 3

## 2017-01-15 MED ORDER — QUETIAPINE FUMARATE 300 MG PO TABS
300.0000 mg | ORAL_TABLET | Freq: Every day | ORAL | Status: DC
  Administered 2017-01-16 – 2017-01-18 (×4): 300 mg via ORAL
  Filled 2017-01-15 (×5): qty 1

## 2017-01-15 MED ORDER — TIOTROPIUM BROMIDE MONOHYDRATE 18 MCG IN CAPS
18.0000 ug | ORAL_CAPSULE | Freq: Every day | RESPIRATORY_TRACT | Status: DC
  Administered 2017-01-16 – 2017-01-19 (×4): 18 ug via RESPIRATORY_TRACT
  Filled 2017-01-15: qty 5

## 2017-01-15 MED ORDER — MAGNESIUM CITRATE PO SOLN
1.0000 | Freq: Once | ORAL | Status: DC | PRN
  Filled 2017-01-15: qty 296

## 2017-01-15 MED ORDER — CEFAZOLIN SODIUM-DEXTROSE 2-4 GM/100ML-% IV SOLN
2.0000 g | INTRAVENOUS | Status: DC
  Filled 2017-01-15: qty 100

## 2017-01-15 MED ORDER — ALBUTEROL SULFATE (2.5 MG/3ML) 0.083% IN NEBU
2.5000 mg | INHALATION_SOLUTION | Freq: Four times a day (QID) | RESPIRATORY_TRACT | Status: DC | PRN
  Administered 2017-01-18 – 2017-01-19 (×2): 2.5 mg via RESPIRATORY_TRACT

## 2017-01-15 MED ORDER — CARBOXYMETHYLCELLULOSE SODIUM 0.25 % OP SOLN: 2.0000 [drp] | Freq: Four times a day (QID) | OPHTHALMIC | Status: DC

## 2017-01-15 MED ORDER — POLYVINYL ALCOHOL 1.4 % OP SOLN
2.0000 [drp] | Freq: Four times a day (QID) | OPHTHALMIC | Status: DC
  Administered 2017-01-16 – 2017-01-18 (×7): 2 [drp] via OPHTHALMIC
  Filled 2017-01-15: qty 15

## 2017-01-15 MED ORDER — PANTOPRAZOLE SODIUM 40 MG PO TBEC
40.0000 mg | DELAYED_RELEASE_TABLET | Freq: Every day | ORAL | Status: DC
  Administered 2017-01-16 – 2017-01-19 (×4): 40 mg via ORAL
  Filled 2017-01-15 (×4): qty 1

## 2017-01-15 MED ORDER — SODIUM CHLORIDE 0.9 % IV BOLUS (SEPSIS)
1000.0000 mL | Freq: Once | INTRAVENOUS | Status: AC
  Administered 2017-01-15: 1000 mL via INTRAVENOUS

## 2017-01-15 MED ORDER — LATANOPROST 0.005 % OP SOLN
1.0000 [drp] | Freq: Every day | OPHTHALMIC | Status: DC
  Administered 2017-01-18: 1 [drp] via OPHTHALMIC
  Filled 2017-01-15 (×2): qty 2.5

## 2017-01-15 NOTE — ED Notes (Signed)
Patient transported to CT 

## 2017-01-15 NOTE — Consult Note (Signed)
ORTHOPAEDIC CONSULTATION  REQUESTING PHYSICIAN: Myrna Blazer*  Chief Complaint:   Right hip pain.  History of Present Illness: Perry Bullock is a 56 y.o. male with multiple medical problems including asthma, COPD, hepatitis C, peripheral vascular disease, glaucoma, bipolar disorder, anxiety, and chronic pain who apparently fell onto his right side earlier this evening while intoxicated. He was brought to the emergency room where x-rays demonstrated a mildly displaced intertrochanteric/subtrochanteric fracture of his right hip. The patient denies any associated injuries. He denies any chest pain, shortness of breath, chest pain, or other symptoms that may have precipitated his fall. He does note pain with any attempted active or passive motion of the hip, but denies any numbness or paresthesias down his leg.  Past Medical History:  Diagnosis Date  . Anxiety   . Arthritis   . Asthma   . Bipolar 1 disorder (HCC)   . Blind right eye   . Chronic back pain    Diverticulosis  . Chronic headaches   . Convulsion (HCC)    once a month when stands up too quickly  . COPD (chronic obstructive pulmonary disease) (HCC)   . DDD (degenerative disc disease), lumbar   . Diverticulitis   . GERD (gastroesophageal reflux disease)   . Glaucoma   . Hepatitis    hepatitis C  . Pneumonia   . PVD (peripheral vascular disease) (HCC)    lower extremities reddened and feet swollen  . Seasonal allergies   . Shortness of breath dyspnea    Past Surgical History:  Procedure Laterality Date  . EYE SURGERY Right   . INTUBATION-ENDOTRACHEAL WITH TRACHEOSTOMY STANDBY N/A 04/22/2015   Procedure: INTUBATION-ENDOTRACHEAL WITH TRACHEOSTOMY STANDBY;  Surgeon: Linus Salmons, MD;  Location: [facility] ORS;  Service: ENT;  Laterality: N/A;  . JOINT REPLACEMENT Left    Lt shoulder  . MANDIBLE SURGERY     Social History   Social History  .  Marital status: Single    Spouse name: N/A  . Number of children: N/A  . Years of education: N/A   Social History Main Topics  . Smoking status: Current Every Day Smoker    Packs/day: 0.00    Years: 35.00    Types: Cigarettes  . Smokeless tobacco: Never Used     Comment: 2-3 cigs daily--02/27/16  . Alcohol use 3.6 oz/week    6 Cans of beer per week     Comment: occ  . Drug use: No  . Sexual activity: Yes    Birth control/ protection: None   Other Topics Concern  . Not on file   Social History Narrative  . No narrative on file   Family History  Problem Relation Age of Onset  . Breast cancer Mother   . Hypertension Father   . Diabetes Father    Allergies  Allergen Reactions  . Ace Inhibitors Swelling   Prior to Admission medications   Medication Sig Start Date End Date Taking? Authorizing Provider  albuterol (PROAIR HFA) 108 (90 Base) MCG/ACT inhaler Inhale 2 puffs into the lungs every 6 (six) hours as needed for wheezing or shortness of breath. 02/27/16   Erin Fulling, MD  albuterol (PROVENTIL) (2.5 MG/3ML) 0.083% nebulizer solution Take 3 mLs (2.5 mg total) by nebulization every 6 (six) hours as needed for wheezing or shortness of breath. 12/25/15   Shane Crutch, MD  aspirin 81 MG chewable tablet Chew 81 mg by mouth. 11/07/15   [provider]  Carboxymethylcellulose Sodium (THERATEARS) 0.25 % SOLN  Apply 2 drops to eye 4 (four) times daily.    [provider]  cetirizine (ZYRTEC) 10 MG tablet Take 10 mg by mouth daily.    [provider]  fluticasone (FLONASE) 50 MCG/ACT nasal spray Place 2 sprays into both nostrils as needed.     [provider]  gabapentin (NEURONTIN) 300 MG capsule Take 900 mg by mouth 3 (three) times daily.     [provider]  latanoprost (XALATAN) 0.005 % ophthalmic solution Place 1 drop into both eyes at bedtime.    [provider]  mometasone-formoterol (DULERA) 200-5 MCG/ACT AERO Inhale 2  puffs into the lungs 2 (two) times daily. 02/27/16   Erin FullingKasa, Kurian, MD  nicotine (NICODERM CQ - DOSED IN MG/24 HOURS) 21 mg/24hr patch Place 1 patch (21 mg total) onto the skin daily. 11/22/15   Shane Crutchamachandran, Pradeep, MD  ondansetron (ZOFRAN) 4 MG tablet Take 1 tablet (4 mg total) by mouth every 8 (eight) hours as needed for nausea. 04/06/15   Enedina FinnerPatel, Sona, MD  pantoprazole (PROTONIX) 40 MG tablet Take 40 mg by mouth daily.    [provider]  Polyethyl Glycol-Propyl Glycol (SYSTANE) 0.4-0.3 % SOLN Apply 1 drop to eye.    [provider]  QUEtiapine (SEROQUEL) 300 MG tablet Take 300 mg by mouth at bedtime.    [provider]  sucralfate (CARAFATE) 1 g tablet Take 1 g by mouth 4 (four) times daily -  with meals and at bedtime.    [provider]  tiotropium (SPIRIVA) 18 MCG inhalation capsule Place 1 capsule (18 mcg total) into inhaler and inhale daily. 02/27/16   Erin FullingKasa, Kurian, MD  traZODone (DESYREL) 150 MG tablet Take 150 mg by mouth at bedtime.    [provider]  zolpidem (AMBIEN) 10 MG tablet Take 10 mg by mouth at bedtime as needed for sleep.    [provider]   Ct Head Wo Contrast  Result Date: 01/15/2017 CLINICAL DATA:  Pt reports fell today. Pain in right hip. Right leg is shortened but not rotated. Pt has hx of left hip replacement. Pts wife gave 10mg  percocet prior to EMS arrival. Pt alert and oriented x 4 . PT reports having hit head but denies head pain at this time. EXAM: CT HEAD WITHOUT CONTRAST TECHNIQUE: Contiguous axial images were obtained from the base of the skull through the vertex without intravenous contrast. COMPARISON:  04/22/2015 FINDINGS: Brain: No evidence of acute infarction, hemorrhage, hydrocephalus, extra-axial collection or mass lesion/mass effect. The ventricles and sulci are mildly enlarged reflecting mild generalized atrophy. Vascular: No hyperdense vessel or unexpected calcification. Skull: Normal. Negative for  fracture or focal lesion. Sinuses/Orbits: Globes and orbits are unremarkable. Visualized sinuses and mastoid air cells are clear. Other: None. IMPRESSION: 1. No acute intracranial abnormalities. 2. Mild generalized atrophy, stable from the prior exam. Electronically Signed   By: Amie Portlandavid  Ormond M.D.   On: 01/15/2017 20:51    Positive ROS: All other systems have been reviewed and were otherwise negative with the exception of those mentioned in the HPI and as above.  Physical Exam: General:  Alert, no acute distress Psychiatric:  Patient is competent for consent with normal mood and affect   Cardiovascular:  No pedal edema Respiratory:  No wheezing, non-labored breathing GI:  Abdomen is soft and non-tender Skin:  No lesions in the area of chief complaint Neurologic:  Sensation intact distally Lymphatic:  No axillary or cervical lymphadenopathy  Orthopedic Exam:  Orthopedic examination is  limited to the right hip and lower extremity. The right lower extremity is somewhat shortened and externally rotated as compared to the left. Skin inspection around the right hip is unremarkable. There is no erythema, ecchymosis, abrasions, or rashes. There is moderate pain to palpation over the trochanteric region. In addition, he has pain with any attempted active or passive motion of the hip. He is able to actively dorsiflex and plantarflex his toes and ankle. Sensations intact to light touch to all restorations. His both feet demonstrate evidence of chronic peripheral vascular disease, but he does have reasonable capillary refill to both feet.  X-rays:  X-rays of the pelvis and right hip are available for review. These films demonstrate a mildly displaced essentially transverse fracture through the inner troches/subtrochanteric region of the right hip. No significant degenerative changes of the hip joint are noted. No lytic lesions are identified.  Assessment: Closed displaced intertrochanteric/subtrochanteric  fracture right hip.  Plan: The treatment options are discussed with the patient, including both surgical and nonsurgical options. The patient would like to proceed with surgical intervention to include a reduction and internal fixation of the right hip fracture with a trochanteric femoral nail. This procedure has been discussed in detail, as have the potential risks (including bleeding, infection, nerve and/or blood vessel injury, persistent or recurrent pain, malunion and/or nonunion, progression to degenerative joint disease, stiffness, need for further surgery, blood clots, strokes, heart attacks and/or arrhythmias, etc.) and benefits. The patient states his understanding and wishes to proceed. A formal written consent will be obtained by the nursing staff.  Thank you for ask me to participate in the care of this most unfortunate man. I will be happy to follow him with you.   Maryagnes Amos, MD  Beeper #:  [phone]  01/15/2017 9:06 PM

## 2017-01-15 NOTE — H&P (Signed)
History and Physical   [facility] - [place] @ [facility] Admission History and Physical [employer], D.O.    Patient Name: Perry Bullock MR#: [id number] Date of Birth: May 27, 1961 Date of Admission: 01/15/2017  Referring MD/NP/PA: Dr. Pershing Proud Primary Care Physician: [facility], [facility]  Chief Complaint:  Chief Complaint  Patient presents with  . Fall    HPI: Perry Bullock is a 56 y.o. male with a known history of asthma, COPD, hepatitis C, peripheral vascular disease, glaucoma, bipolar disorder, anxiety, and chronic pain presents to the emergency department for evaluation of hip pain s/p fallWhile intoxicated.  In the ED he was found to have a mildly displaced intertroch/subtroch fracture of the right hip for which medical admission was requested. Patient complains of intractable right hip pain, unable to bear weight.  Patient denies fevers/chills, weakness, dizziness, chest pain, shortness of breath, N/V/C/D, abdominal pain, dysuria/frequency, changes in mental status.    Otherwise there has been no change in status. Patient has been taking medication as prescribed and there has been no recent change in medication or diet.  No recent antibiotics.  There has been no recent illness, hospitalizations, travel or sick contacts.    Patient is functionally independent and is not limited by chest pain or shortness of breath.  He has not had a cardiac workup.   Review of Systems:  CONSTITUTIONAL: No fever/chills, fatigue, weakness, weight gain/loss, headache. EYES: No blurry or double vision. ENT: No tinnitus, postnasal drip, redness or soreness of the oropharynx. RESPIRATORY: No cough, dyspnea, wheeze.  No hemoptysis.  CARDIOVASCULAR: No chest pain, palpitations, syncope, orthopnea. No lower extremity edema.  GASTROINTESTINAL: No nausea, vomiting, abdominal pain, diarrhea, constipation.  No hematemesis, melena or hematochezia. GENITOURINARY: No dysuria, frequency,  hematuria. ENDOCRINE: No polyuria or nocturia. No heat or cold intolerance. HEMATOLOGY: No anemia, bruising, bleeding. INTEGUMENTARY: No rashes, ulcers, lesions. MUSCULOSKELETAL: Right hip pain. No arthritis, gout, dyspnea. NEUROLOGIC: No numbness, tingling, ataxia, seizure-type activity, weakness. PSYCHIATRIC: No anxiety, depression, insomnia.   Past Medical History:  Diagnosis Date  . Anxiety   . Arthritis   . Asthma   . Bipolar 1 disorder (HCC)   . Blind right eye   . Chronic back pain    Diverticulosis  . Chronic headaches   . Convulsion (HCC)    once a month when stands up too quickly  . COPD (chronic obstructive pulmonary disease) (HCC)   . DDD (degenerative disc disease), lumbar   . Diverticulitis   . GERD (gastroesophageal reflux disease)   . Glaucoma   . Hepatitis    hepatitis C  . Pneumonia   . PVD (peripheral vascular disease) (HCC)    lower extremities reddened and feet swollen  . Seasonal allergies   . Shortness of breath dyspnea     Past Surgical History:  Procedure Laterality Date  . EYE SURGERY Right   . INTUBATION-ENDOTRACHEAL WITH TRACHEOSTOMY STANDBY N/A 04/22/2015   Procedure: INTUBATION-ENDOTRACHEAL WITH TRACHEOSTOMY STANDBY;  Surgeon: Linus Salmons, MD;  Location: [facility] ORS;  Service: ENT;  Laterality: N/A;  . JOINT REPLACEMENT Left    Lt shoulder  . MANDIBLE SURGERY       reports that he has been smoking Cigarettes.  He has been smoking about 0.00 packs per day for the past 35.00 years. He has never used smokeless tobacco. He reports that he drinks about 3.6 oz of alcohol per week . He reports that he does not use drugs.  Allergies  Allergen Reactions  . Ace Inhibitors  Swelling    Family History  Problem Relation Age of Onset  . Breast cancer Mother   . Hypertension Father   . Diabetes Father     Prior to Admission medications   Medication Sig Start Date End Date Taking? Authorizing Provider  albuterol (PROAIR HFA) 108 (90 Base)  MCG/ACT inhaler Inhale 2 puffs into the lungs every 6 (six) hours as needed for wheezing or shortness of breath. 02/27/16   Erin Fulling, MD  albuterol (PROVENTIL) (2.5 MG/3ML) 0.083% nebulizer solution Take 3 mLs (2.5 mg total) by nebulization every 6 (six) hours as needed for wheezing or shortness of breath. 12/25/15   Shane Crutch, MD  aspirin 81 MG chewable tablet Chew 81 mg by mouth. 11/07/15   [provider]  Carboxymethylcellulose Sodium (THERATEARS) 0.25 % SOLN Apply 2 drops to eye 4 (four) times daily.    [provider]  cetirizine (ZYRTEC) 10 MG tablet Take 10 mg by mouth daily.    [provider]  fluticasone (FLONASE) 50 MCG/ACT nasal spray Place 2 sprays into both nostrils as needed.     [provider]  gabapentin (NEURONTIN) 300 MG capsule Take 900 mg by mouth 3 (three) times daily.     [provider]  latanoprost (XALATAN) 0.005 % ophthalmic solution Place 1 drop into both eyes at bedtime.    [provider]  mometasone-formoterol (DULERA) 200-5 MCG/ACT AERO Inhale 2 puffs into the lungs 2 (two) times daily. 02/27/16   Erin Fulling, MD  nicotine (NICODERM CQ - DOSED IN MG/24 HOURS) 21 mg/24hr patch Place 1 patch (21 mg total) onto the skin daily. 11/22/15   Shane Crutch, MD  ondansetron (ZOFRAN) 4 MG tablet Take 1 tablet (4 mg total) by mouth every 8 (eight) hours as needed for nausea. 04/06/15   Enedina Finner, MD  pantoprazole (PROTONIX) 40 MG tablet Take 40 mg by mouth daily.    [provider]  Polyethyl Glycol-Propyl Glycol (SYSTANE) 0.4-0.3 % SOLN Apply 1 drop to eye.    [provider]  QUEtiapine (SEROQUEL) 300 MG tablet Take 300 mg by mouth at bedtime.    [provider]  sucralfate (CARAFATE) 1 g tablet Take 1 g by mouth 4 (four) times daily -  with meals and at bedtime.    [provider]  tiotropium (SPIRIVA) 18 MCG inhalation capsule Place 1 capsule (18 mcg total) into  inhaler and inhale daily. 02/27/16   Erin Fulling, MD  traZODone (DESYREL) 150 MG tablet Take 150 mg by mouth at bedtime.    [provider]  zolpidem (AMBIEN) 10 MG tablet Take 10 mg by mouth at bedtime as needed for sleep.    [provider]    Physical Exam: Vitals:   01/15/17 2033 01/15/17 2034  BP:  (!) 139/99  Pulse:  (!) 110  Resp:  20  Temp:  98.2 F (36.8 C)  TempSrc:  Oral  SpO2:  95%  Weight: 76.7 kg (169 lb)   Height: 6' (1.829 m)     GENERAL: 56 y.o.-year-old male patient, well-developed, well-nourished lying in the bed in no acute distress.  Pleasant and cooperative.   HEENT: Head atraumatic, normocephalic. Right eye blind, glaucoma..Mucus membranes moist. NECK: Supple, full range of motion. No JVD, no bruit heard. No thyroid enlargement, no tenderness, no cervical lymphadenopathy. CHEST: Normal breath sounds bilaterally. No wheezing, rales, rhonchi or crackles. No use of accessory muscles of respiration.  No reproducible chest wall tenderness.  CARDIOVASCULAR: S1, S2  normal. No murmurs, rubs, or gallops. Cap refill <2 seconds. Pulses intact distally.  ABDOMEN: Soft, nondistended, nontender. No rebound, guarding, rigidity. Normoactive bowel sounds present in all four quadrants. No organomegaly or mass. EXTREMITIES: Right leg shortened and externally rotated. Tenderness to palpation over right hip. No pedal edema, cyanosis, or clubbing. No calf tenderness or Homan's sign.  NEUROLOGIC: The patient is alert and oriented x 3. Cranial nerves II through XII are grossly intact with no focal sensorimotor deficit. PSYCHIATRIC:  Normal affect, mood, thought content. SKIN: Warm, dry, and intact without obvious rash, lesion, or ulcer.    Labs on Admission:  CBC: No results for input(s): WBC, NEUTROABS, HGB, HCT, MCV, PLT in the last 168 hours. Basic Metabolic Panel: No results for input(s): NA, K, CL, CO2, GLUCOSE, BUN, CREATININE, CALCIUM, MG, PHOS in the last  168 hours. GFR: CrCl cannot be calculated (Patient's most recent lab result is older than the maximum 21 days allowed.). Liver Function Tests: No results for input(s): AST, ALT, ALKPHOS, BILITOT, PROT, ALBUMIN in the last 168 hours. No results for input(s): LIPASE, AMYLASE in the last 168 hours. No results for input(s): AMMONIA in the last 168 hours. Coagulation Profile: No results for input(s): INR, PROTIME in the last 168 hours. Cardiac Enzymes: No results for input(s): CKTOTAL, CKMB, CKMBINDEX, TROPONINI in the last 168 hours. BNP (last 3 results) No results for input(s): PROBNP in the last 8760 hours. HbA1C: No results for input(s): HGBA1C in the last 72 hours. CBG: No results for input(s): GLUCAP in the last 168 hours. Lipid Profile: No results for input(s): CHOL, HDL, LDLCALC, TRIG, CHOLHDL, LDLDIRECT in the last 72 hours. Thyroid Function Tests: No results for input(s): TSH, T4TOTAL, FREET4, T3FREE, THYROIDAB in the last 72 hours. Anemia Panel: No results for input(s): VITAMINB12, FOLATE, FERRITIN, TIBC, IRON, RETICCTPCT in the last 72 hours. Urine analysis:    Component Value Date/Time   COLORURINE YELLOW (A) 09/16/2016 1823   APPEARANCEUR CLEAR (A) 09/16/2016 1823   APPEARANCEUR Clear 07/13/2014 0933   LABSPEC 1.014 09/16/2016 1823   LABSPEC 1.005 07/13/2014 0933   PHURINE 6.0 09/16/2016 1823   GLUCOSEU NEGATIVE 09/16/2016 1823   GLUCOSEU Negative 07/13/2014 0933   HGBUR NEGATIVE 09/16/2016 1823   BILIRUBINUR NEGATIVE 09/16/2016 1823   BILIRUBINUR Negative 07/13/2014 0933   KETONESUR NEGATIVE 09/16/2016 1823   PROTEINUR NEGATIVE 09/16/2016 1823   UROBILINOGEN 0.2 10/01/2010 1133   NITRITE NEGATIVE 09/16/2016 1823   LEUKOCYTESUR NEGATIVE 09/16/2016 1823   LEUKOCYTESUR Trace 07/13/2014 0933   Sepsis Labs: @LABRCNTIP (procalcitonin:4,lacticidven:4) )No results found for this or any previous visit (from the past 240 hour(s)).   Radiological Exams on Admission: Dg  Chest 1 View  Result Date: 01/15/2017 CLINICAL DATA:  Fall, Pain in right hip. Right leg is shortened but not rotated. Pt has hx of left hip replacement, hx of asthma, COPD, GERD, smoker EXAM: CHEST 1 VIEW COMPARISON:  Chest x-ray dated 09/16/2016. FINDINGS: Heart size and mediastinal contours are within normal limits. Atherosclerotic change noted at the aortic arch. Evidence of chronic interstitial lung disease. Emphysematous changes at the right lung apex. No evidence of pneumonia or pulmonary edema. No pleural effusion or pneumothorax seen. Old fractures of multiple left ribs. Left shoulder arthroplasty hardware incompletely imaged. No acute or suspicious osseous finding. IMPRESSION: 1. No active disease.  No evidence of pneumonia or pulmonary edema. 2. Emphysema. 3. Aortic atherosclerosis. Electronically Signed   By: Bary Richard M.D.   On: 01/15/2017 21:16   Ct Head Wo  Contrast  Result Date: 01/15/2017 CLINICAL DATA:  Pt reports fell today. Pain in right hip. Right leg is shortened but not rotated. Pt has hx of left hip replacement. Pts wife gave 10mg  percocet prior to EMS arrival. Pt alert and oriented x 4 . PT reports having hit head but denies head pain at this time. EXAM: CT HEAD WITHOUT CONTRAST TECHNIQUE: Contiguous axial images were obtained from the base of the skull through the vertex without intravenous contrast. COMPARISON:  04/22/2015 FINDINGS: Brain: No evidence of acute infarction, hemorrhage, hydrocephalus, extra-axial collection or mass lesion/mass effect. The ventricles and sulci are mildly enlarged reflecting mild generalized atrophy. Vascular: No hyperdense vessel or unexpected calcification. Skull: Normal. Negative for fracture or focal lesion. Sinuses/Orbits: Globes and orbits are unremarkable. Visualized sinuses and mastoid air cells are clear. Other: None. IMPRESSION: 1. No acute intracranial abnormalities. 2. Mild generalized atrophy, stable from the prior exam. Electronically  Signed   By: Amie Portlandavid  Ormond M.D.   On: 01/15/2017 20:51   Dg Hip Unilat W Or Wo Pelvis 2-3 Views Right  Result Date: 01/15/2017 CLINICAL DATA:  Fall, Pain in right hip. Right leg is shortened but not rotated. Pt has hx of left hip replacement, hx of asthma, COPD, GERD, smoker EXAM: DG HIP (WITH OR WITHOUT PELVIS) 2-3V RIGHT COMPARISON:  None. FINDINGS: Displaced fracture within the intertrochanteric region of the right femoral neck. Associated angulation deformity at the fracture site. Right femoral head appears appropriately positioned relative to the acetabulum. No additional fracture seen within the osseous pelvis. IMPRESSION: Displaced fracture of the right femoral neck, intertrochanteric, with associated mild angulation deformity at the fracture site and suspected impaction at the fracture site. Right femoral head remains appropriately positioned relative to the acetabulum. Electronically Signed   By: Bary RichardStan  Maynard M.D.   On: 01/15/2017 21:15    EKG: Sinus tach at 102 bpm with normal axis, RBBB and nonspecific ST-T wave changes.   Assessment/Plan  This is a 56 y.o. male with a history of asthma, COPD, hepatitis C, peripheral vascular disease, glaucoma, bipolar disorder, anxiety, and chronic pain now being admitted with:  #. Right intertroch hip fracture.  - Admit inpatient - Management per ortho - NPO after midnight - Check labs - May proceed with surgery as planned.   #. History of COPD -Continue Spiriva, dulera, inhalers, Flonase, Zyrtec  #. History of bipolar disorder and anxiety -Continue Seroquel, trazodone  #. History of GERD - Continue Protonix, Carafate  #. History of glaucoma - Continue eyedrops  #. History of chronic pain - Continue gabapentin  #. History of peripheral vascular disease - Patient prescribed aspirin but is not taking. Hold for now  #. History of tobacco use disorder - Continue nicotine patch  Admission status: Inpatient IV Fluids: Normal  saline Diet/Nutrition: Nothing by mouth Consults called: Ortho DVT Px: Heparin SCDs and early ambulation. Code Status: Full Code  Disposition Plan: To be determined in 2-3 days  All the records are reviewed and case discussed with ED provider. Management plans discussed with the patient and/or family who express understanding and agree with plan of care.  Dalal Livengood D.O. on 01/15/2017 at 9:24 PM Between 7am to 6pm - Pager - [phone] After 6pm go to [email or web address] - [email or web address] [email or web address] [facility] [place] [facility] Office [phone] CC: Primary care physician; [facility]   01/15/2017, 9:24 PM

## 2017-01-15 NOTE — ED Notes (Signed)
PT given applesauce and milk and dietary was called by this RN and soup has been requested. Pt able to eat before 12 midnight and has verbalized desire for foods he can eat without teeth. Pt had teeth removed recently.

## 2017-01-15 NOTE — ED Provider Notes (Signed)
[facility] Emergency Department Provider Note  ____________________________________________   First MD Initiated Contact with Patient 01/15/17 2033     (approximate)  I have reviewed the triage vital signs and the nursing notes.   HISTORY  Chief Complaint Fall   HPI AB LEAMING is a 56 y.o. male with a history of bipolar disorder and hepatitis C who is presenting to the emergency department todayafter a fall while intoxicated. The patient landed on his right hip and also hit the back of his head but did not lose consciousness. He is denying any headache at this time. However, he says that he is a "wanting out of 10" pain to his right hip. He has been unable to bear weight and was brought in by EMS who noted shortening of the right lower extremity.   Past Medical History:  Diagnosis Date  . Anxiety   . Arthritis   . Asthma   . Bipolar 1 disorder (HCC)   . Blind right eye   . Chronic back pain    Diverticulosis  . Chronic headaches   . Convulsion (HCC)    once a month when stands up too quickly  . COPD (chronic obstructive pulmonary disease) (HCC)   . DDD (degenerative disc disease), lumbar   . Diverticulitis   . GERD (gastroesophageal reflux disease)   . Glaucoma   . Hepatitis    hepatitis C  . Pneumonia   . PVD (peripheral vascular disease) (HCC)    lower extremities reddened and feet swollen  . Seasonal allergies   . Shortness of breath dyspnea     Patient Active Problem List   Diagnosis Date Noted  . Substance induced mood disorder (HCC) 03/05/2016  . Alcohol abuse 03/05/2016  . Cocaine abuse 03/05/2016  . Angioedema 04/22/2015  . Abscess of upper lobe of right lung without pneumonia (HCC) 04/03/2015  . COPD (chronic obstructive pulmonary disease) (HCC) 04/03/2015  . Hep C w/o coma, chronic (HCC) 04/03/2015  . Bipolar disorder (HCC) 04/03/2015    Past Surgical History:  Procedure Laterality Date  . EYE SURGERY Right   .  INTUBATION-ENDOTRACHEAL WITH TRACHEOSTOMY STANDBY N/A 04/22/2015   Procedure: INTUBATION-ENDOTRACHEAL WITH TRACHEOSTOMY STANDBY;  Surgeon: Linus Salmons, MD;  Location: [facility] ORS;  Service: ENT;  Laterality: N/A;  . JOINT REPLACEMENT Left    Lt shoulder  . MANDIBLE SURGERY      Prior to Admission medications   Medication Sig Start Date End Date Taking? Authorizing Provider  albuterol (PROAIR HFA) 108 (90 Base) MCG/ACT inhaler Inhale 2 puffs into the lungs every 6 (six) hours as needed for wheezing or shortness of breath. 02/27/16   Erin Fulling, MD  albuterol (PROVENTIL) (2.5 MG/3ML) 0.083% nebulizer solution Take 3 mLs (2.5 mg total) by nebulization every 6 (six) hours as needed for wheezing or shortness of breath. 12/25/15   Shane Crutch, MD  aspirin 81 MG chewable tablet Chew 81 mg by mouth. 11/07/15   [provider]  Carboxymethylcellulose Sodium (THERATEARS) 0.25 % SOLN Apply 2 drops to eye 4 (four) times daily.    [provider]  cetirizine (ZYRTEC) 10 MG tablet Take 10 mg by mouth daily.    [provider]  fluticasone (FLONASE) 50 MCG/ACT nasal spray Place 2 sprays into both nostrils as needed.     [provider]  gabapentin (NEURONTIN) 300 MG capsule Take 900 mg by mouth 3 (three) times daily.     [provider]  latanoprost (XALATAN) 0.005 %  ophthalmic solution Place 1 drop into both eyes at bedtime.    [provider]  mometasone-formoterol (DULERA) 200-5 MCG/ACT AERO Inhale 2 puffs into the lungs 2 (two) times daily. 02/27/16   Erin FullingKasa, Kurian, MD  nicotine (NICODERM CQ - DOSED IN MG/24 HOURS) 21 mg/24hr patch Place 1 patch (21 mg total) onto the skin daily. 11/22/15   Shane Crutchamachandran, Pradeep, MD  ondansetron (ZOFRAN) 4 MG tablet Take 1 tablet (4 mg total) by mouth every 8 (eight) hours as needed for nausea. 04/06/15   Enedina FinnerPatel, Sona, MD  pantoprazole (PROTONIX) 40 MG tablet Take 40 mg by mouth daily.    [provider]    Polyethyl Glycol-Propyl Glycol (SYSTANE) 0.4-0.3 % SOLN Apply 1 drop to eye.    [provider]  QUEtiapine (SEROQUEL) 300 MG tablet Take 300 mg by mouth at bedtime.    [provider]  sucralfate (CARAFATE) 1 g tablet Take 1 g by mouth 4 (four) times daily -  with meals and at bedtime.    [provider]  tiotropium (SPIRIVA) 18 MCG inhalation capsule Place 1 capsule (18 mcg total) into inhaler and inhale daily. 02/27/16   Erin FullingKasa, Kurian, MD  traZODone (DESYREL) 150 MG tablet Take 150 mg by mouth at bedtime.    [provider]  zolpidem (AMBIEN) 10 MG tablet Take 10 mg by mouth at bedtime as needed for sleep.    [provider]    Allergies Ace inhibitors  Family History  Problem Relation Age of Onset  . Breast cancer Mother   . Hypertension Father   . Diabetes Father     Social History Social History  Substance Use Topics  . Smoking status: Current Every Day Smoker    Packs/day: 0.00    Years: 35.00    Types: Cigarettes  . Smokeless tobacco: Never Used     Comment: 2-3 cigs daily--02/27/16  . Alcohol use 3.6 oz/week    6 Cans of beer per week     Comment: occ    Review of Systems  Constitutional: No fever/chills Eyes: No visual changes. ENT: No sore throat. Cardiovascular: Denies chest pain. Respiratory: Denies shortness of breath. Gastrointestinal: No abdominal pain.  No nausea, no vomiting.  No diarrhea.  No constipation. Genitourinary: Negative for dysuria. Musculoskeletal: Negative for back pain. Skin: Negative for rash. Neurological: Negative for headaches, focal weakness or numbness.   ____________________________________________   PHYSICAL EXAM:  VITAL SIGNS: ED Triage Vitals  Enc Vitals Group     BP 01/15/17 2034 (!) 139/99     Pulse Rate 01/15/17 2034 (!) 110     Resp 01/15/17 2034 20     Temp 01/15/17 2034 98.2 F (36.8 C)     Temp Source 01/15/17 2034 Oral     SpO2 01/15/17 2034 95 %     Weight  01/15/17 2033 169 lb (76.7 kg)     Height 01/15/17 2033 6' (1.829 m)     Head Circumference --      Peak Flow --      Pain Score 01/15/17 2033 10     Pain Loc --      Pain Edu? --      Excl. in GC? --     Constitutional: Alert and oriented. Well appearing and in no acute distress. Eyes: Conjunctivae are normal.  Head: Atraumatic. Nose: No congestion/rhinnorhea. Mouth/Throat: Mucous membranes are moist.  Neck: No stridor.   Cardiovascular: Normal rate, regular rhythm. Grossly normal heart sounds.  Good  peripheral circulation with equal and bilateral dorsalis pedis pulses. Respiratory: Normal respiratory effort.  No retractions. Lungs CTAB. Gastrointestinal: Soft and nontender. No distention. Musculoskeletal: Tenderness to palpation to the right hip with shortening of the right lower extremity. Patient is able to fully range his bilateral toes as well as having sensation that is intact to light touch and bilateral dorsalis pedis pulses. Neurologic:  Normal speech and language. No gross focal neurologic deficits are appreciated. Skin:  Skin is warm, dry and intact. No rash noted. Psychiatric: Mood and affect are normal. Speech and behavior are normal.  ____________________________________________   LABS (all labs ordered are listed, but only abnormal results are displayed)  Labs Reviewed  ETHANOL  CBC WITH DIFFERENTIAL/PLATELET  BASIC METABOLIC PANEL  PROTIME-INR  APTT  TYPE AND SCREEN   ____________________________________________  EKG  ED ECG REPORT I, Schaevitz,  Teena Irani, the attending physician, personally viewed and interpreted this ECG.   Date: 01/15/2017  EKG Time: 2116  Rate: 102  Rhythm: sinus tachycardia  Axis: Normal  Intervals:right bundle branch block  ST&T Change: No ST segment elevation or depression. No abnormal T-wave inversion. Right bundle branch block as seen on previous EKGs ____________________________________________  RADIOLOGY  No acute  process seen on the CT report.  Hip x-ray with displaced fracture of the right femoral neck, intertrochanteric fracture.  Chest x-ray without active disease ____________________________________________   PROCEDURES  Procedure(s) performed:   Procedures  Critical Care performed:   ____________________________________________   INITIAL IMPRESSION / ASSESSMENT AND PLAN / ED COURSE  Pertinent labs & imaging results that were available during my care of the patient were reviewed by me and considered in my medical decision making (see chart for details).  ----------------------------------------- 9:19 PM on 01/15/2017 -----------------------------------------  Patient found to have right sided hip fracture. Pending labs at this time. Orthopedic doctor, Dr. Joice Lofts, aware. Discussed with the hospitalist, Dr. Emmit Pomfret, who will be admitting the patient. The patient is understanding that he will need to be admitted to the hospital. No distress at this time.       ____________________________________________   FINAL CLINICAL IMPRESSION(S) / ED DIAGNOSES  Right sided hip fracture. Fall. Alcohol intoxication.    NEW MEDICATIONS STARTED DURING THIS VISIT:  New Prescriptions   No medications on file     Note:  This document was prepared using Dragon voice recognition software and may include unintentional dictation errors.     Myrna Blazer, MD 01/15/17 2130

## 2017-01-15 NOTE — ED Triage Notes (Signed)
Pt reports having fallen in back year. Pain in right hip. Right leg is shortened but not rotated. Pt has hx of left hip replacement. Pts wife gave 10mg  percocet prior to EMS arrival. Pt alert and oriented x 4 . PT reports having hit head but denies LOC. Denies pain in head at this tim e.

## 2017-01-16 ENCOUNTER — Encounter: Payer: Self-pay | Admitting: *Deleted

## 2017-01-16 ENCOUNTER — Inpatient Hospital Stay: Payer: Medicaid Other | Admitting: Anesthesiology

## 2017-01-16 ENCOUNTER — Inpatient Hospital Stay: Payer: Medicaid Other

## 2017-01-16 ENCOUNTER — Encounter
Admission: EM | Disposition: A | Discharge status: Home Health | Payer: Self-pay | Source: Home / Self Care | Attending: Internal Medicine

## 2017-01-16 DIAGNOSIS — S72141A Displaced intertrochanteric fracture of right femur, initial encounter for closed fracture: Secondary | ICD-10-CM | POA: Insufficient documentation

## 2017-01-16 HISTORY — PX: INTRAMEDULLARY (IM) NAIL INTERTROCHANTERIC: SHX5875

## 2017-01-16 LAB — CBC
HCT: 32.8 % — ABNORMAL LOW (ref 40.0–52.0)
Hemoglobin: 11.1 g/dL — ABNORMAL LOW (ref 13.0–18.0)
MCH: 34 pg (ref 26.0–34.0)
MCHC: 34 g/dL (ref 32.0–36.0)
MCV: 100.2 fL — ABNORMAL HIGH (ref 80.0–100.0)
Platelets: 249 10*3/uL (ref 150–440)
RBC: 3.27 MIL/uL — ABNORMAL LOW (ref 4.40–5.90)
RDW: 14.1 % (ref 11.5–14.5)
WBC: 11.7 10*3/uL — ABNORMAL HIGH (ref 3.8–10.6)

## 2017-01-16 LAB — BASIC METABOLIC PANEL
Anion gap: 5 (ref 5–15)
BUN: 9 mg/dL (ref 6–20)
CALCIUM: 8.5 mg/dL — AB (ref 8.9–10.3)
CO2: 29 mmol/L (ref 22–32)
CREATININE: 0.83 mg/dL (ref 0.61–1.24)
Chloride: 102 mmol/L (ref 101–111)
GFR calc non Af Amer: >60 mL/min (ref >60)
Glucose, Bld: 122 mg/dL — ABNORMAL HIGH (ref 65–99)
Potassium: 4.2 mmol/L (ref 3.5–5.1)
SODIUM: 136 mmol/L (ref 135–145)

## 2017-01-16 LAB — SURGICAL PCR SCREEN
MRSA, PCR: NEGATIVE
STAPHYLOCOCCUS AUREUS: NEGATIVE

## 2017-01-16 SURGERY — FIXATION, FRACTURE, INTERTROCHANTERIC, WITH INTRAMEDULLARY ROD
Anesthesia: Spinal | Site: Hip | Laterality: Right | Wound class: Clean

## 2017-01-16 MED ORDER — KCL IN DEXTROSE-NACL 20-5-0.9 MEQ/L-%-% IV SOLN
INTRAVENOUS | Status: DC
  Administered 2017-01-16: 18:00:00 via INTRAVENOUS
  Filled 2017-01-16 (×4): qty 1000

## 2017-01-16 MED ORDER — KETAMINE HCL 50 MG/ML IJ SOLN
INTRAMUSCULAR | Status: DC | PRN
  Administered 2017-01-16 (×2): 50 mg via INTRAMUSCULAR

## 2017-01-16 MED ORDER — ACETAMINOPHEN 500 MG PO TABS
1000.0000 mg | ORAL_TABLET | Freq: Four times a day (QID) | ORAL | Status: AC
  Administered 2017-01-16 – 2017-01-17 (×4): 1000 mg via ORAL
  Filled 2017-01-16 (×4): qty 2

## 2017-01-16 MED ORDER — FENTANYL CITRATE (PF) 100 MCG/2ML IJ SOLN
INTRAMUSCULAR | Status: AC
  Filled 2017-01-16: qty 2

## 2017-01-16 MED ORDER — METOCLOPRAMIDE HCL 10 MG PO TABS: 5.0000 mg | ORAL_TABLET | Freq: Three times a day (TID) | ORAL | Status: DC | PRN

## 2017-01-16 MED ORDER — MAGNESIUM HYDROXIDE 400 MG/5ML PO SUSP
30.0000 mL | Freq: Every day | ORAL | Status: DC | PRN
  Administered 2017-01-17: 30 mL via ORAL
  Filled 2017-01-16: qty 30

## 2017-01-16 MED ORDER — CEFAZOLIN SODIUM-DEXTROSE 2-4 GM/100ML-% IV SOLN
2.0000 g | Freq: Four times a day (QID) | INTRAVENOUS | Status: AC
  Administered 2017-01-16 – 2017-01-17 (×3): 2 g via INTRAVENOUS
  Filled 2017-01-16 (×3): qty 100

## 2017-01-16 MED ORDER — BUPIVACAINE-EPINEPHRINE (PF) 0.5% -1:200000 IJ SOLN
INTRAMUSCULAR | Status: DC | PRN
  Administered 2017-01-16: 30 mL via PERINEURAL

## 2017-01-16 MED ORDER — BISACODYL 10 MG RE SUPP: 10.0000 mg | Freq: Every day | RECTAL | Status: DC | PRN

## 2017-01-16 MED ORDER — KETOROLAC TROMETHAMINE 30 MG/ML IJ SOLN
INTRAMUSCULAR | Status: AC
  Administered 2017-01-16: 30 mg
  Filled 2017-01-16: qty 1

## 2017-01-16 MED ORDER — NEOMYCIN-POLYMYXIN B GU 40-200000 IR SOLN
Status: AC
  Filled 2017-01-16: qty 2

## 2017-01-16 MED ORDER — ACETAMINOPHEN 325 MG PO TABS
650.0000 mg | ORAL_TABLET | Freq: Four times a day (QID) | ORAL | Status: DC | PRN
  Administered 2017-01-18: 650 mg via ORAL
  Filled 2017-01-16: qty 2

## 2017-01-16 MED ORDER — FENTANYL CITRATE (PF) 100 MCG/2ML IJ SOLN
INTRAMUSCULAR | Status: DC | PRN
  Administered 2017-01-16: 100 ug via INTRAVENOUS

## 2017-01-16 MED ORDER — PHENYLEPHRINE HCL 10 MG/ML IJ SOLN
INTRAMUSCULAR | Status: DC | PRN
  Administered 2017-01-16 (×4): 100 ug via INTRAVENOUS

## 2017-01-16 MED ORDER — KETOROLAC TROMETHAMINE 15 MG/ML IJ SOLN
15.0000 mg | Freq: Four times a day (QID) | INTRAMUSCULAR | Status: AC
  Administered 2017-01-16 – 2017-01-17 (×4): 15 mg via INTRAVENOUS
  Filled 2017-01-16 (×4): qty 1

## 2017-01-16 MED ORDER — METOCLOPRAMIDE HCL 5 MG/ML IJ SOLN: 5.0000 mg | Freq: Three times a day (TID) | INTRAMUSCULAR | Status: DC | PRN

## 2017-01-16 MED ORDER — NEOMYCIN-POLYMYXIN B GU 40-200000 IR SOLN
Status: DC | PRN
  Administered 2017-01-16: 2 mL

## 2017-01-16 MED ORDER — DOCUSATE SODIUM 100 MG PO CAPS
100.0000 mg | ORAL_CAPSULE | Freq: Two times a day (BID) | ORAL | Status: DC
  Administered 2017-01-16 – 2017-01-19 (×6): 100 mg via ORAL
  Filled 2017-01-16 (×6): qty 1

## 2017-01-16 MED ORDER — ADULT MULTIVITAMIN W/MINERALS CH
1.0000 | ORAL_TABLET | Freq: Every day | ORAL | Status: DC
  Administered 2017-01-17 – 2017-01-19 (×3): 1 via ORAL
  Filled 2017-01-16 (×3): qty 1

## 2017-01-16 MED ORDER — BUPIVACAINE HCL (PF) 0.5 % IJ SOLN
INTRAMUSCULAR | Status: AC
  Filled 2017-01-16: qty 10

## 2017-01-16 MED ORDER — ENOXAPARIN SODIUM 40 MG/0.4ML ~~LOC~~ SOLN
40.0000 mg | SUBCUTANEOUS | Status: DC
  Administered 2017-01-17 – 2017-01-19 (×3): 40 mg via SUBCUTANEOUS
  Filled 2017-01-16 (×3): qty 0.4

## 2017-01-16 MED ORDER — FLEET ENEMA 7-19 GM/118ML RE ENEM: 1.0000 | ENEMA | Freq: Once | RECTAL | Status: DC | PRN

## 2017-01-16 MED ORDER — MORPHINE SULFATE ER 30 MG PO TBCR
30.0000 mg | EXTENDED_RELEASE_TABLET | Freq: Every day | ORAL | Status: DC
  Administered 2017-01-16 – 2017-01-19 (×4): 30 mg via ORAL
  Filled 2017-01-16 (×4): qty 1

## 2017-01-16 MED ORDER — FOLIC ACID 1 MG PO TABS
1.0000 mg | ORAL_TABLET | Freq: Every day | ORAL | Status: DC
  Administered 2017-01-17 – 2017-01-19 (×3): 1 mg via ORAL
  Filled 2017-01-16 (×3): qty 1

## 2017-01-16 MED ORDER — PROPOFOL 500 MG/50ML IV EMUL
INTRAVENOUS | Status: AC
  Filled 2017-01-16: qty 50

## 2017-01-16 MED ORDER — OXYCODONE HCL 5 MG PO TABS: 10.0000 mg | ORAL_TABLET | Freq: Four times a day (QID) | ORAL | Status: DC | PRN

## 2017-01-16 MED ORDER — DIPHENHYDRAMINE HCL 12.5 MG/5ML PO ELIX: 12.5000 mg | ORAL_SOLUTION | ORAL | Status: DC | PRN

## 2017-01-16 MED ORDER — MIDAZOLAM HCL 2 MG/2ML IJ SOLN
INTRAMUSCULAR | Status: AC
  Filled 2017-01-16: qty 2

## 2017-01-16 MED ORDER — SODIUM CHLORIDE 0.9 % IV SOLN
INTRAVENOUS | Status: DC | PRN
  Administered 2017-01-16: 15 ug/min via INTRAVENOUS

## 2017-01-16 MED ORDER — BUPIVACAINE-EPINEPHRINE (PF) 0.5% -1:200000 IJ SOLN
INTRAMUSCULAR | Status: AC
  Filled 2017-01-16: qty 30

## 2017-01-16 MED ORDER — ONDANSETRON HCL 4 MG/2ML IJ SOLN: 4.0000 mg | Freq: Once | INTRAMUSCULAR | Status: DC | PRN

## 2017-01-16 MED ORDER — MORPHINE SULFATE (PF) 2 MG/ML IV SOLN
2.0000 mg | INTRAVENOUS | Status: DC | PRN
  Administered 2017-01-17: 2 mg via INTRAVENOUS
  Filled 2017-01-16: qty 1

## 2017-01-16 MED ORDER — LACTATED RINGERS IV SOLN
INTRAVENOUS | Status: DC | PRN
  Administered 2017-01-16: 13:00:00 via INTRAVENOUS

## 2017-01-16 MED ORDER — PROPOFOL 10 MG/ML IV BOLUS
INTRAVENOUS | Status: AC
  Filled 2017-01-16: qty 20

## 2017-01-16 MED ORDER — PROPOFOL 500 MG/50ML IV EMUL
INTRAVENOUS | Status: DC | PRN
  Administered 2017-01-16: 125 ug/kg/min via INTRAVENOUS

## 2017-01-16 MED ORDER — CEFAZOLIN SODIUM-DEXTROSE 2-3 GM-% IV SOLR
INTRAVENOUS | Status: DC | PRN
  Administered 2017-01-16: 2 g via INTRAVENOUS

## 2017-01-16 MED ORDER — BUPIVACAINE HCL (PF) 0.5 % IJ SOLN
INTRAMUSCULAR | Status: DC | PRN
  Administered 2017-01-16: 3 mL

## 2017-01-16 MED ORDER — LORAZEPAM 1 MG PO TABS: 1.0000 mg | ORAL_TABLET | Freq: Four times a day (QID) | ORAL | Status: AC | PRN

## 2017-01-16 MED ORDER — VITAMIN B-1 100 MG PO TABS
100.0000 mg | ORAL_TABLET | Freq: Every day | ORAL | Status: DC
  Administered 2017-01-17 – 2017-01-19 (×3): 100 mg via ORAL
  Filled 2017-01-16 (×3): qty 1

## 2017-01-16 MED ORDER — ACETAMINOPHEN 650 MG RE SUPP: 650.0000 mg | Freq: Four times a day (QID) | RECTAL | Status: DC | PRN

## 2017-01-16 MED ORDER — SODIUM CHLORIDE 0.9 % IV SOLN: INTRAVENOUS | Status: DC | PRN

## 2017-01-16 MED ORDER — LORAZEPAM 2 MG/ML IJ SOLN: 1.0000 mg | Freq: Four times a day (QID) | INTRAMUSCULAR | Status: AC | PRN

## 2017-01-16 MED ORDER — MIDAZOLAM HCL 5 MG/5ML IJ SOLN
INTRAMUSCULAR | Status: DC | PRN
  Administered 2017-01-16 (×2): 2 mg via INTRAVENOUS

## 2017-01-16 MED ORDER — ONDANSETRON HCL 4 MG/2ML IJ SOLN: 4.0000 mg | Freq: Four times a day (QID) | INTRAMUSCULAR | Status: DC | PRN

## 2017-01-16 MED ORDER — KETAMINE HCL 50 MG/ML IJ SOLN
INTRAMUSCULAR | Status: AC
  Filled 2017-01-16: qty 10

## 2017-01-16 MED ORDER — FENTANYL CITRATE (PF) 100 MCG/2ML IJ SOLN: 25.0000 ug | INTRAMUSCULAR | Status: DC | PRN

## 2017-01-16 MED ORDER — ONDANSETRON HCL 4 MG PO TABS
4.0000 mg | ORAL_TABLET | Freq: Four times a day (QID) | ORAL | Status: DC | PRN
  Administered 2017-01-16 – 2017-01-18 (×2): 4 mg via ORAL
  Filled 2017-01-16 (×2): qty 1

## 2017-01-16 MED ORDER — OXYCODONE HCL 5 MG PO TABS
10.0000 mg | ORAL_TABLET | ORAL | Status: DC | PRN
  Administered 2017-01-16 – 2017-01-19 (×12): 10 mg via ORAL
  Filled 2017-01-16 (×14): qty 2

## 2017-01-16 MED ORDER — KETOROLAC TROMETHAMINE 15 MG/ML IJ SOLN
30.0000 mg | Freq: Once | INTRAMUSCULAR | Status: AC
  Administered 2017-01-16: 30 mg via INTRAVENOUS

## 2017-01-16 SURGICAL SUPPLY — 39 items
BNDG COHESIVE 4X5 TAN STRL (GAUZE/BANDAGES/DRESSINGS) ×3 IMPLANT
BNDG COHESIVE 6X5 TAN STRL LF (GAUZE/BANDAGES/DRESSINGS) ×3 IMPLANT
CANISTER SUCT 1200ML W/VALVE (MISCELLANEOUS) ×3 IMPLANT
CHLORAPREP W/TINT 26ML (MISCELLANEOUS) ×6 IMPLANT
CORTICAL BONE SCR 5.0MM X 48MM (Screw) ×3 IMPLANT
COVER LIGHT HANDLE STERIS (MISCELLANEOUS) ×3 IMPLANT
DRAPE C-ARMOR (DRAPES) ×3 IMPLANT
DRAPE SHEET LG 3/4 BI-LAMINATE (DRAPES) ×3 IMPLANT
DRSG OPSITE POSTOP 3X4 (GAUZE/BANDAGES/DRESSINGS) ×3 IMPLANT
DRSG OPSITE POSTOP 4X6 (GAUZE/BANDAGES/DRESSINGS) ×6 IMPLANT
ELECT CAUTERY BLADE 6.4 (BLADE) ×3 IMPLANT
ELECT REM PT RETURN 9FT ADLT (ELECTROSURGICAL) ×3
ELECTRODE REM PT RTRN 9FT ADLT (ELECTROSURGICAL) ×1 IMPLANT
GAUZE SPONGE 4X4 12PLY STRL (GAUZE/BANDAGES/DRESSINGS) ×3 IMPLANT
GLOVE BIO SURGEON STRL SZ8 (GLOVE) ×6 IMPLANT
GLOVE INDICATOR 8.0 STRL GRN (GLOVE) ×3 IMPLANT
GOWN STRL REUS W/ TWL LRG LVL3 (GOWN DISPOSABLE) ×1 IMPLANT
GOWN STRL REUS W/ TWL XL LVL3 (GOWN DISPOSABLE) ×1 IMPLANT
GOWN STRL REUS W/TWL LRG LVL3 (GOWN DISPOSABLE) ×2
GOWN STRL REUS W/TWL XL LVL3 (GOWN DISPOSABLE) ×2
GUIDEPIN VERSANAIL DSP 3.2X444 (ORTHOPEDIC DISPOSABLE SUPPLIES) ×3 IMPLANT
GUIDEWIRE BALL NOSE 100CM (WIRE) ×3 IMPLANT
MAT BLUE FLOOR 46X72 FLO (MISCELLANEOUS) ×3 IMPLANT
NAIL HIP FRACTURE 11X380MM (Nail) ×3 IMPLANT
NEEDLE FILTER BLUNT 18X 1/2SAF (NEEDLE) ×2
NEEDLE FILTER BLUNT 18X1 1/2 (NEEDLE) ×1 IMPLANT
NEEDLE HYPO 22GX1.5 SAFETY (NEEDLE) ×3 IMPLANT
NS IRRIG 500ML POUR BTL (IV SOLUTION) ×3 IMPLANT
PACK HIP COMPR (MISCELLANEOUS) ×3 IMPLANT
SCREW CORTICL BON 5.0MM X 48MM (Screw) ×1 IMPLANT
SCREW LAG 10.5MMX105MM HFN (Screw) ×3 IMPLANT
SCREWDRIVER HEX TIP 3.5MM (MISCELLANEOUS) ×3 IMPLANT
STAPLER SKIN PROX 35W (STAPLE) ×3 IMPLANT
STRAP SAFETY BODY (MISCELLANEOUS) ×3 IMPLANT
SUT VIC AB 1 CT1 36 (SUTURE) ×3 IMPLANT
SUT VIC AB 2-0 CT1 (SUTURE) ×6 IMPLANT
SYR 30ML LL (SYRINGE) ×3 IMPLANT
SYRINGE 10CC LL (SYRINGE) ×3 IMPLANT
TAPE MICROFOAM 4IN (TAPE) ×3 IMPLANT

## 2017-01-16 NOTE — Progress Notes (Addendum)
Spoke to Dr. Allena KatzPatel per patient's request. The patient would like to continue the pain meds that he is taking through the pain clinic. Per order from Dr. Allena KatzPatel this nurse entered an order for Morphine Sulfate ER 30mg  1x daily and oxycodone IR 10mg  for pain q6 PRN

## 2017-01-16 NOTE — Anesthesia Preprocedure Evaluation (Signed)
Anesthesia Evaluation  Patient identified by MRN, date of birth, ID band Patient awake    Reviewed: Allergy & Precautions, H&P , NPO status , Patient's Chart, lab work & pertinent test results, reviewed documented beta blocker date and time   History of Anesthesia Complications Negative for: history of anesthetic complications  Airway Mallampati: III  TM Distance: >3 FB Neck ROM: full    Dental  (+) Edentulous Upper, Edentulous Lower   Pulmonary shortness of breath and with exertion, asthma , neg sleep apnea, pneumonia, resolved, COPD,  COPD inhaler, neg recent URI, Current Smoker,           Cardiovascular Exercise Tolerance: Good hypertension, (-) angina+ Peripheral Vascular Disease  (-) CAD, (-) Past MI, (-) Cardiac Stents and (-) CABG (-) dysrhythmias (-) Valvular Problems/Murmurs     Neuro/Psych Seizures -,  PSYCHIATRIC DISORDERS (Bipolar) negative psych ROS   GI/Hepatic GERD  ,(+)     substance abuse  alcohol use, Hepatitis -, C  Endo/Other  negative endocrine ROS  Renal/GU negative Renal ROS  negative genitourinary   Musculoskeletal   Abdominal   Peds  Hematology negative hematology ROS (+)   Anesthesia Other Findings Past Medical History: No date: Anxiety No date: Arthritis No date: Asthma No date: Bipolar 1 disorder (HCC) No date: Blind right eye No date: Chronic back pain     Comment:  Diverticulosis No date: Chronic headaches No date: Convulsion (HCC)     Comment:  once a month when stands up too quickly No date: COPD (chronic obstructive pulmonary disease) (HCC) No date: DDD (degenerative disc disease), lumbar No date: Diverticulitis No date: GERD (gastroesophageal reflux disease) No date: Glaucoma No date: Hepatitis     Comment:  hepatitis C No date: Pneumonia No date: PVD (peripheral vascular disease) (HCC)     Comment:  lower extremities reddened and feet swollen No date: Seasonal  allergies No date: Shortness of breath dyspnea   Reproductive/Obstetrics negative OB ROS                             Anesthesia Physical Anesthesia Plan  ASA: III  Anesthesia Plan: Spinal   Post-op Pain Management:    Induction:   PONV Risk Score and Plan: 0 and Treatment may vary due to age  Airway Management Planned: Natural Airway and Simple Face Mask  Additional Equipment:   Intra-op Plan:   Post-operative Plan:   Informed Consent: I have reviewed the patients History and Physical, chart, labs and discussed the procedure including the risks, benefits and alternatives for the proposed anesthesia with the patient or authorized representative who has indicated his/her understanding and acceptance.   Dental Advisory Given  Plan Discussed with: Anesthesiologist, CRNA and Surgeon  Anesthesia Plan Comments:         Anesthesia Quick Evaluation

## 2017-01-16 NOTE — Progress Notes (Signed)
Pt is currently in surgery.  Resumed home pain chronic meds.

## 2017-01-16 NOTE — Anesthesia Procedure Notes (Signed)
Spinal  Patient location during procedure: OR Start time: 01/16/2017 1:25 PM End time: 01/16/2017 1:27 PM Staffing Anesthesiologist: Martha Clan Resident/CRNA: Nelda Marseille Performed: resident/CRNA  Preanesthetic Checklist Completed: patient identified, site marked, surgical consent, pre-op evaluation, timeout performed, IV checked, risks and benefits discussed and monitors and equipment checked Spinal Block Patient position: right lateral decubitus Prep: ChloraPrep Patient monitoring: heart rate, continuous pulse ox, blood pressure and cardiac monitor Approach: midline Location: L3-4 Injection technique: single-shot Needle Needle type: Whitacre and Introducer  Needle gauge: 24 G Needle length: 9 cm Assessment Sensory level: T10 Additional Notes Negative paresthesia. Negative blood return. Positive free-flowing CSF. Expiration date of kit checked and confirmed. Patient tolerated procedure well, without complications.

## 2017-01-16 NOTE — Transfer of Care (Signed)
Immediate Anesthesia Transfer of Care Note  Patient: Perry Bullock  Procedure(s) Performed: Procedure(s): INTRAMEDULLARY (IM) NAIL INTERTROCHANTRIC (Right)  Patient Location: PACU  Anesthesia Type:spinal  Level of Consciousness: awake, alert  and oriented  Airway & Oxygen Therapy: Patient Spontanous Breathing and Patient connected to face mask oxygen  Post-op Assessment: Report given to RN and Post -op Vital signs reviewed and stable  Post vital signs: Reviewed and stable  Last Vitals:  Vitals:   01/16/17 0746 01/16/17 1234  BP: 116/80 (!) 137/96  Pulse: 100 92  Resp: 19 16  Temp: 37.1 C 36.8 C    Last Pain:  Vitals:   01/16/17 1234  TempSrc: Tympanic  PainSc: 10-Worst pain ever      Patients Stated Pain Goal: 3 (01/16/17 1234)  Complications: No apparent anesthesia complications

## 2017-01-16 NOTE — Op Note (Signed)
01/15/2017 - 01/16/2017  2:52 PM  Patient:   Perry Bullock  Pre-Op Diagnosis:   Closed displaced 2-part intertrochanteric right hip fracture.  Post-Op Diagnosis:   Same.  Procedure:   Reduction and internal fixation of displaced 2 part intertrochanteric right hip fracture with Biomet Affixis TFN nail.  Surgeon:   Maryagnes Amos, MD  Assistant:   None  Anesthesia:   Spinal  Findings:   As above  Complications:   None  EBL:   100 cc  Fluids:   700 cc crystalloid  UOP:   None  TT:   None  Drains:   None  Closure:   Staples  Implants:   Biomet Affixis 11 x 380 mm TFN with a 105 mm lag screw and a 48 mm distal interlocking screw  Brief Clinical Note:   The patient is a 56 year old male who sustained the above-noted injury yesterday evening when he fell onto his right side well working in his yard. He was brought to the emergency room where x-rays demonstrated the above-noted fracture. The patient has been cleared medically and presents at this time for reduction and internal fixation of the 2-part intertrochanteric right hip fracture.  Procedure:   The patient was brought into the operating room. After adequate spinal anesthesia was obtained, the patient was lain in the supine position on the fracture table. The uninjured leg was placed in a flexed and abducted position while the injured lower extremity was placed in longitudinal traction. The fracture was reduced using longitudinal traction and internal rotation. The adequacy of reduction was verified fluoroscopically in AP and lateral projections and found to be near anatomic. The lateral aspects of the right hip and thigh were prepped with ChloraPrep solution before being draped sterilely. Preoperative antibiotics were administered. A timeout was performed to verify the appropriate surgical site. The greater trochanter was identified fluoroscopically and an approximately 3 cm incision made about 2-3 fingerbreadths above the tip  of the greater trochanter. The incision was carried down through the subcutaneous tissues to expose the gluteal fascia. This was split the length of the incision, providing access to the tip of the trochanter. Under fluoroscopic guidance, a guidewire was drilled through the tip of the trochanter into the proximal metaphysis to the level of the lesser trochanter. After verifying its position fluoroscopically in AP and lateral projections, it was overreamed with the initial reamer to the depth of the lesser trochanter. A guidewire was passed down through the femoral canal to the supracondylar region. The adequacy of guidewire position was verified fluoroscopically in AP and lateral projections before the length of the guidewire within the canal was measured and found to be 400 mm. Therefore, a 380 mm length nail was selected. The guidewire was overreamed sequentially using the flexible reamers, beginning with a 10 mm reamer and progressing to a 12.5 mm reamer. This provided good cortical chatter. The 11 x 380 mm Biomet Affixis TFN rod was selected and advanced to the appropriate depth, as verified fluoroscopically.   The guide system for the lag screw was positioned and advanced through an approximately 2 cm stab incision over the lateral aspect of the proximal femur. The guidewire was drilled up through the trochanteric femoral nail and into the femoral neck to rest within 5 mm of subchondral bone. After verifying its position in the femoral neck and head in both AP and lateral projections, the guidewire was measured and found to be optimally replicated by a 105 mm lag screw. The  guidewire was overreamed to the appropriate depth before the lag screw was inserted and advanced to the appropriate depth as verified fluoroscopically in AP and lateral projections. The locking screw was advanced, then backed off a quarter turn to set the lag screw. Again the adequacy of hardware position and fracture reduction was  verified fluoroscopically in AP and lateral projections and found to be excellent.  Attention was directed distally. Using the "perfect circle" technique, the leg and fluoroscopy machine were positioned appropriately. An approximate 1.5 cm stab incision was made over the skin at the appropriate point before the drill bit was advanced through the cortex and across the static hole of the nail. The appropriate length of the screw was determined before the 48 mm distal interlocking screw was positioned, then advanced and tightened securely. Again the adequacy of screw position was verified fluoroscopically in AP and lateral projections and found to be excellent.  The wounds were irrigated thoroughly with sterile saline solution before the abductor fascia was reapproximated using #1 Vicryl interrupted sutures. The subcutaneous tissues were closed using 2-0 Vicryl interrupted sutures. The skin was closed using staples. A total of 30 cc of 0.5% Sensorcaine with epinephrine was injected in and around all incisions. Sterile occlusive dressings were applied to all wounds before the patient was transferred back to his/her hospital bed. The patient was then transferred to the recovery room in satisfactory condition after tolerating the procedure well.

## 2017-01-16 NOTE — Anesthesia Procedure Notes (Signed)
Date/Time: 01/16/2017 1:25 PM Performed by: Junious SilkNOLES, Steel Kerney Pre-anesthesia Checklist: Patient identified, Emergency Drugs available, Suction available, Patient being monitored and Timeout performed Oxygen Delivery Method: Simple face mask

## 2017-01-16 NOTE — Anesthesia Post-op Follow-up Note (Cosign Needed)
Anesthesia QCDR form completed.        

## 2017-01-17 LAB — BASIC METABOLIC PANEL
Anion gap: 6 (ref 5–15)
BUN: 11 mg/dL (ref 6–20)
CALCIUM: 8.1 mg/dL — AB (ref 8.9–10.3)
CHLORIDE: 100 mmol/L — AB (ref 101–111)
CO2: 29 mmol/L (ref 22–32)
CREATININE: 0.75 mg/dL (ref 0.61–1.24)
GFR calc non Af Amer: >60 mL/min (ref >60)
GLUCOSE: 136 mg/dL — AB (ref 65–99)
Potassium: 4.8 mmol/L (ref 3.5–5.1)
Sodium: 135 mmol/L (ref 135–145)

## 2017-01-17 LAB — CBC WITH DIFFERENTIAL/PLATELET
BASOS PCT: 0 %
Basophils Absolute: 0 10*3/uL (ref 0–0.1)
Eosinophils Absolute: 0.1 10*3/uL (ref 0–0.7)
Eosinophils Relative: 1 %
HEMATOCRIT: 29.2 % — AB (ref 40.0–52.0)
Hemoglobin: 9.7 g/dL — ABNORMAL LOW (ref 13.0–18.0)
LYMPHS ABS: 1.2 10*3/uL (ref 1.0–3.6)
Lymphocytes Relative: 10 %
MCH: 33.8 pg (ref 26.0–34.0)
MCHC: 33.3 g/dL (ref 32.0–36.0)
MCV: 101.5 fL — AB (ref 80.0–100.0)
MONO ABS: 0.7 10*3/uL (ref 0.2–1.0)
Monocytes Relative: 6 %
NEUTROS ABS: 9.9 10*3/uL — AB (ref 1.4–6.5)
Neutrophils Relative %: 83 %
Platelets: 206 10*3/uL (ref 150–440)
RBC: 2.88 MIL/uL — ABNORMAL LOW (ref 4.40–5.90)
RDW: 14.1 % (ref 11.5–14.5)
WBC: 12 10*3/uL — ABNORMAL HIGH (ref 3.8–10.6)

## 2017-01-17 NOTE — Progress Notes (Signed)
Physical Therapy Treatment Patient Details Name: Verda CuminsGary L Kulak MRN: [id number] DOB: June 23, 1961 Today's Date: 01/17/2017    History of Present Illness Patient is a 56 y.o. male admitted on 19 JUL after falling while intoxicated, fracturing R hip. Patient s/p R hip intramedullary nailing by Dr. Joice LoftsPoggi on 20 JUL. 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     PT Comments    Patient in good mood upon PT returning for f/u session. Performed bed exercises due to increased fatigue from gait assessment during evaluation. PT noted continued, consistent audible/palpable popping of R LE with hip strengthening exercises that occurred even with decreasing range of motion and utilizing active-assisted technique. Deferred/adjusted exercises accordingly, as patient experiences pain that increases with each pop. Will await advising from surgeon. Patient remains motivated to progress towards goals.  Follow Up Recommendations  Home health PT     Equipment Recommendations  Other (comment) (Detachable shower head)    Recommendations for Other Services       Precautions / Restrictions Precautions Precautions: Fall Restrictions Weight Bearing Restrictions: Yes (WBAT)    Mobility  Bed Mobility Overal bed mobility: Needs Assistance Bed Mobility: Supine to Sit;Sit to Supine     Supine to sit: Min assist Sit to supine: Mod assist   General bed mobility comments: Patient performs supine to sit with minimal assistance with RLE and verbal cues for sequencing. Required moderate assistance to return to supine with LEs and verbal cues for sequencing. Util[facility] trapeze [facility]  Transfers Overall transfer level: Needs assistance Equipment used: Rolling walker (2 wheeled) Transfers: Sit to/from Stand Sit to Stand: Min assist         General transfer comment: Patient moves from sit to stand and stand to sit, requiring increased time and  minimal assistance.  Ambulation/Gait Ambulation/Gait assistance: Min guard Ambulation Distance (Feet): 40 Feet Assistive device: Rolling walker (2 wheeled)       General Gait Details: Patient ambulates at decreased cadence, requiring multiple standing rest breaks. Required verbal cues not to shrug shoulders. Complained of "popping" with weightbearing through R LE.   Stairs            Wheelchair Mobility    Modified Rankin (Stroke Patients Only)       Balance Overall balance assessment: Needs assistance;History of Falls Sitting-balance support: No upper extremity supported;Feet supported Sitting balance-Leahy Scale: Good     Standing balance support: Bilateral upper extremity supported Standing balance-Leahy Scale: Fair                              Cognition Arousal/Alertness: Awake/alert Behavior During Therapy: WFL for tasks assessed/performed Overall Cognitive Status: Within Functional Limits for tasks assessed                                        Exercises General Exercises - Lower Extremity Ankle Circles/Pumps: AROM;Strengthening;Other reps (comment) (30) Quad Sets: AROM;Strengthening;20 reps Gluteal Sets: AROM;Strengthening;20 reps Short Arc Quad: AROM;Strengthening;15 reps Hip ABduction/ADduction: AAROM;Strengthening;Both;10 reps;Other (comment) (Limited ROM on R LE due to popping) Straight Leg Raises: AAROM;Strengthening;Both;10 reps;Other (comment) (Limited ROM on R LE due to popping)    General Comments        Pertinent Vitals/Pain Pain Assessment: Faces Faces Pain Scale: Hurts a little bit Pain Location: R hip Pain Descriptors /  Indicators: Aching Pain Intervention(s): Limited activity within patient's tolerance;Monitored during session;Repositioned;Ice applied    Home Living Family/patient expects to be discharged to:: Private residence Living Arrangements: Spouse/significant other Available Help at Discharge:  Available PRN/intermittently;Family;Friend(s) Type of Home: House Home Access: Stairs to enter Entrance Stairs-Rails: Can reach both Home Layout: One level Home Equipment: Walker - 2 wheels;Bedside commode;Tub bench      Prior Function Level of Independence: Independent          PT Goals (current goals can now be found in the care plan section) Acute Rehab PT Goals Patient Stated Goal: To get moving PT Goal Formulation: With patient Time For Goal Achievement: 01/31/17 Potential to Achieve Goals: Good Progress towards PT goals: Progressing toward goals    Frequency    BID      PT Plan Current plan remains appropriate    Co-evaluation              AM-PAC PT "6 Clicks" Daily Activity  Outcome Measure  Difficulty turning over in bed (including adjusting bedclothes, sheets and blankets)?: A Little Difficulty moving from lying on back to sitting on the side of the bed? : A Little Difficulty sitting down on and standing up from a chair with arms (e.g., wheelchair, bedside commode, etc,.)?: A Little Help needed moving to and from a bed to chair (including a wheelchair)?: A Little Help needed walking in hospital room?: A Lot Help needed climbing 3-5 steps with a railing? : A Lot 6 Click Score: 16    End of Session Equipment Utilized During Treatment: Gait belt Activity Tolerance: Patient tolerated treatment well Patient left: in bed;with call bell/phone within reach;with bed alarm set Nurse Communication: Mobility status PT Visit Diagnosis: Muscle weakness (generalized) (M62.81);History of falling (Z91.81);Difficulty in walking, not elsewhere classified (R26.2);Pain Pain - Right/Left: Right Pain - part of body: Hip     Time: 1610-9604 PT Time Calculation (min) (ACUTE ONLY): 19 min  Charges:  $Therapeutic Exercise: 8-22 mins                    G Codes:         Neita Carp, PT, DPT 01/17/2017, 2:17 PM

## 2017-01-17 NOTE — Clinical Social Work Note (Signed)
CSW received consult for possible nursing home placement. CSW will follow pending PT recommendations.  Argentina PonderKaren Martha Delbra Zellars, MSW, Theresia MajorsLCSWA [phone]

## 2017-01-17 NOTE — Progress Notes (Addendum)
Physical Therapy Evaluation Patient Details Name: Perry CuminsGary L Lienemann MRN: [id number] DOB: February 24, 1961 Today's Date: 01/17/2017   History of Present Illness  Patient is a 56 y.o. male admitted on 19 JUL after falling while intoxicated, fracturing R hip. Patient s/p R hip intramedullary nailing by Dr. Joice LoftsPoggi on 20 JUL. 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   Clinical Impression  Patient is a pleasant, motivated male, admitted for above listed reasons. On evaluation, patient demonstrates need for minimal to moderate assistance with bed mobility and sit to stand transfer. Once ambulating, he required verbal cues to not shrug shoulders and offload R LE as needed with use of UEs. Patient does experience "popping" with weightbearing through R LE which was communicated to RN. Patient will benefit from skilled and progressive PT with f/u HHPT upon d/c to return to PLOF.    Follow Up Recommendations Home health PT    Equipment Recommendations  Other (comment) (Detachable shower head)    Recommendations for Other Services       Precautions / Restrictions Precautions Precautions: Fall Restrictions Weight Bearing Restrictions: Yes (WBAT)      Mobility  Bed Mobility Overal bed mobility: Needs Assistance Bed Mobility: Supine to Sit;Sit to Supine     Supine to sit: Min assist Sit to supine: Mod assist   General bed mobility comments: Patient performs supine to sit with minimal assistance with RLE and verbal cues for sequencing. Required moderate assistance to return to supine with LEs and verbal cues for sequencing. [facility]  Transfers Overall transfer level: Needs assistance Equipment used: Rolling walker (2 wheeled) Transfers: Sit to/from Stand Sit to Stand: Min assist         General transfer comment: Patient moves from sit to stand and stand to sit, requiring increased time and minimal  assistance.  Ambulation/Gait Ambulation/Gait assistance: Min guard Ambulation Distance (Feet): 40 Feet Assistive device: Rolling walker (2 wheeled)       General Gait Details: Patient ambulates at decreased cadence, requiring multiple standing rest breaks. Required verbal cues not to shrug shoulders. Complained of "popping" with weightbearing through R LE.  Stairs            Wheelchair Mobility    Modified Rankin (Stroke Patients Only)       Balance Overall balance assessment: Needs assistance;History of Falls Sitting-balance support: No upper extremity supported;Feet supported Sitting balance-Leahy Scale: Good     Standing balance support: Bilateral upper extremity supported Standing balance-Leahy Scale: Fair                               Pertinent Vitals/Pain Pain Assessment: Faces Faces Pain Scale: Hurts a little bit Pain Location: R hip Pain Descriptors / Indicators: Aching Pain Intervention(s): Limited activity within patient's tolerance;Monitored during session;Premedicated before session;Ice applied    Home Living Family/patient expects to be discharged to:: Private residence Living Arrangements: Spouse/significant other Available Help at Discharge: Available PRN/intermittently;Family;Friend(s) Type of Home: House Home Access: Stairs to enter Entrance Stairs-Rails: Can reach both Entrance Stairs-Number of Steps: 3 Home Layout: One level Home Equipment: Walker - 2 wheels;Bedside commode;Tub bench      Prior Function Level of Independence: Independent               Hand Dominance        Extremity/Trunk Assessment   Upper Extremity Assessment Upper Extremity Assessment: Overall [facility]  for tasks assessed    Lower Extremity Assessment Lower Extremity Assessment: Generalized weakness;RLE deficits/detail RLE Deficits / Details: Decreased AROM secondary to pain RLE: Unable to fully assess due to pain       Communication    Communication: No difficulties  Cognition Arousal/Alertness: Awake/alert Behavior During Therapy: WFL for tasks assessed/performed Overall Cognitive Status: Within Functional Limits for tasks assessed                                        General Comments      Exercises     Assessment/Plan    PT Assessment Patient needs continued PT services  PT Problem List Decreased strength;Decreased range of motion;Decreased activity tolerance;Decreased balance;Decreased mobility;Decreased safety awareness;Pain       PT Treatment Interventions DME instruction;Gait training;Stair training;Functional mobility training;Therapeutic activities;Therapeutic exercise;Balance training;Patient/family education    PT Goals (Current goals can be found in the Care Plan section)  Acute Rehab PT Goals Patient Stated Goal: To get moving PT Goal Formulation: With patient Time For Goal Achievement: 01/31/17 Potential to Achieve Goals: Good    Frequency BID   Barriers to discharge        Co-evaluation               AM-PAC PT "6 Clicks" Daily Activity  Outcome Measure Difficulty turning over in bed (including adjusting bedclothes, sheets and blankets)?: A Little Difficulty moving from lying on back to sitting on the side of the bed? : A Little Difficulty sitting down on and standing up from a chair with arms (e.g., wheelchair, bedside commode, etc,.)?: A Little Help needed moving to and from a bed to chair (including a wheelchair)?: A Little Help needed walking in hospital room?: A Lot Help needed climbing 3-5 steps with a railing? : A Lot 6 Click Score: 16    End of Session Equipment Utilized During Treatment: Gait belt Activity Tolerance: Patient tolerated treatment well Patient left: in bed;with call bell/phone within reach;with bed alarm set;with nursing/sitter in room;with family/visitor present Nurse Communication: Mobility status PT Visit Diagnosis: History of falling  (Z91.81);Muscle weakness (generalized) (M62.81);Pain;Difficulty in walking, not elsewhere classified (R26.2) Pain - Right/Left: Right Pain - part of body: Hip    Time: 1610-9604 PT Time Calculation (min) (ACUTE ONLY): 29 min   Charges:   PT Evaluation $PT Eval Low Complexity: 1 Procedure     PT G Codes:          Neita Carp, PT, DPT 01/17/2017, 12:01 PM

## 2017-01-17 NOTE — Progress Notes (Signed)
Pt has remained alert and oriented. Medicated for pain during the night with good results. IV infusing without difficulty. Surgical dressing remaining dry and intact. Using urinal to void without difficulty.

## 2017-01-17 NOTE — Progress Notes (Addendum)
[facility]   PATIENT NAME: Perry Bullock    MR#:  [id number]  DATE OF BIRTH:  [birth year]  SUBJECTIVE:   Patient doing well postoperatively 1. Denies any complaints REVIEW OF SYSTEMS:   Review of Systems  Constitutional: Negative for chills, fever and weight loss.  HENT: Negative for ear discharge, ear pain and nosebleeds.   Eyes: Negative for blurred vision, pain and discharge.  Respiratory: Negative for sputum production, shortness of breath, wheezing and stridor.   Cardiovascular: Negative for chest pain, palpitations, orthopnea and PND.  Gastrointestinal: Negative for abdominal pain, diarrhea, nausea and vomiting.  Genitourinary: Negative for frequency and urgency.  Musculoskeletal: Positive for joint pain. Negative for back pain.  Neurological: Positive for weakness. Negative for sensory change, speech change and focal weakness.  Psychiatric/Behavioral: Negative for depression and hallucinations. The patient is not nervous/anxious.    Tolerating Diet:yes Tolerating PT: pedning  DRUG ALLERGIES:   Allergies  Allergen Reactions  . Ace Inhibitors Swelling    VITALS:  Blood pressure 109/69, pulse 84, temperature 98.5 F (36.9 C), temperature source Oral, resp. rate 18, height 6' (1.829 m), weight 76.7 kg (169 lb), SpO2 95 %.  PHYSICAL EXAMINATION:   Physical Exam  GENERAL:  56 y.o.-year-old patient lying in the bed with no acute distress.  EYES: Pupils equal, round, reactive to light and accommodation. No scleral icterus. Extraocular muscles intact.  HEENT: Head atraumatic, normocephalic. Oropharynx and nasopharynx clear.  NECK:  Supple, no jugular venous distention. No thyroid enlargement, no tenderness.  LUNGS: Normal breath sounds bilaterally, no wheezing, rales, rhonchi. No use of accessory muscles of respiration.  CARDIOVASCULAR: S1, S2 normal. No murmurs, rubs, or gallops.  ABDOMEN: Soft, nontender, nondistended.  Bowel sounds present. No organomegaly or mass.  EXTREMITIES: No cyanosis, clubbing or edema b/l.    NEUROLOGIC: Cranial nerves II through XII are intact. No focal Motor or sensory deficits b/l.   PSYCHIATRIC:  patient is alert and oriented x 3.  SKIN: No obvious rash, lesion, or ulcer.   LABORATORY PANEL:  CBC  Recent Labs Lab 01/17/17 0329  WBC 12.0*  HGB 9.7*  HCT 29.2*  PLT 206    Chemistries   Recent Labs Lab 01/17/17 0329  NA 135  K 4.8  CL 100*  CO2 29  GLUCOSE 136*  BUN 11  CREATININE 0.75  CALCIUM 8.1*   Cardiac Enzymes No results for input(s): TROPONINI in the last 168 hours. RADIOLOGY:  Dg Chest 1 View  Result Date: 01/15/2017 CLINICAL DATA:  Fall, Pain in right hip. Right leg is shortened but not rotated. Pt has hx of left hip replacement, hx of asthma, COPD, GERD, smoker EXAM: CHEST 1 VIEW COMPARISON:  Chest x-ray dated 09/16/2016. FINDINGS: Heart size and mediastinal contours are within normal limits. Atherosclerotic change noted at the aortic arch. Evidence of chronic interstitial lung disease. Emphysematous changes at the right lung apex. No evidence of pneumonia or pulmonary edema. No pleural effusion or pneumothorax seen. Old fractures of multiple left ribs. Left shoulder arthroplasty hardware incompletely imaged. No acute or suspicious osseous finding. IMPRESSION: 1. No active disease.  No evidence of pneumonia or pulmonary edema. 2. Emphysema. 3. Aortic atherosclerosis. Electronically Signed   By: Bary Richard M.D.   On: 01/15/2017 21:16   Ct Head Wo Contrast  Result Date: 01/15/2017 CLINICAL DATA:  Pt reports fell today. Pain in right hip. Right leg is shortened but not rotated. Pt has hx of left hip replacement.  Pts wife gave 10mg  percocet prior to EMS arrival. Pt alert and oriented x 4 . PT reports having hit head but denies head pain at this time. EXAM: CT HEAD WITHOUT CONTRAST TECHNIQUE: Contiguous axial images were obtained from the base of the skull  through the vertex without intravenous contrast. COMPARISON:  04/22/2015 FINDINGS: Brain: No evidence of acute infarction, hemorrhage, hydrocephalus, extra-axial collection or mass lesion/mass effect. The ventricles and sulci are mildly enlarged reflecting mild generalized atrophy. Vascular: No hyperdense vessel or unexpected calcification. Skull: Normal. Negative for fracture or focal lesion. Sinuses/Orbits: Globes and orbits are unremarkable. Visualized sinuses and mastoid air cells are clear. Other: None. IMPRESSION: 1. No acute intracranial abnormalities. 2. Mild generalized atrophy, stable from the prior exam. Electronically Signed   By: Amie Portland M.D.   On: 01/15/2017 20:51   Dg C-arm 61-120 Min  Result Date: 01/16/2017 CLINICAL DATA:  Post ORIF of intertrochanteric femur fracture. EXAM: DG C-ARM 61-120 MIN; RIGHT FEMUR 2 VIEWS FLUOROSCOPY TIME:  56 seconds COMPARISON:  Right hip radiographs - 01/15/2017 FINDINGS: Four spot intraoperative radiographic images of the right hip and proximal femur are provided for review Provided images demonstrate the sequela of intramedullary rod fixation of the proximal femur and dynamic screw fixation of the femoral neck. The distal end of the intramedullary femoral rod is transfixed with a single cancellous screw. Improved alignment of previously noted intertrochanteric femur fracture. Regional soft tissues appear normal.  No radiopaque foreign body. IMPRESSION: Post ORIF of intertrochanteric femur fracture without evidence of complication. Electronically Signed   By: Simonne Come M.D.   On: 01/16/2017 14:38   Dg Hip Unilat W Or Wo Pelvis 2-3 Views Right  Result Date: 01/15/2017 CLINICAL DATA:  Fall, Pain in right hip. Right leg is shortened but not rotated. Pt has hx of left hip replacement, hx of asthma, COPD, GERD, smoker EXAM: DG HIP (WITH OR WITHOUT PELVIS) 2-3V RIGHT COMPARISON:  None. FINDINGS: Displaced fracture within the intertrochanteric region of the  right femoral neck. Associated angulation deformity at the fracture site. Right femoral head appears appropriately positioned relative to the acetabulum. No additional fracture seen within the osseous pelvis. IMPRESSION: Displaced fracture of the right femoral neck, intertrochanteric, with associated mild angulation deformity at the fracture site and suspected impaction at the fracture site. Right femoral head remains appropriately positioned relative to the acetabulum. Electronically Signed   By: Bary Richard M.D.   On: 01/15/2017 21:15   Dg Femur, Min 2 Views Right  Result Date: 01/16/2017 CLINICAL DATA:  Post ORIF of intertrochanteric femur fracture. EXAM: DG C-ARM 61-120 MIN; RIGHT FEMUR 2 VIEWS FLUOROSCOPY TIME:  56 seconds COMPARISON:  Right hip radiographs - 01/15/2017 FINDINGS: Four spot intraoperative radiographic images of the right hip and proximal femur are provided for review Provided images demonstrate the sequela of intramedullary rod fixation of the proximal femur and dynamic screw fixation of the femoral neck. The distal end of the intramedullary femoral rod is transfixed with a single cancellous screw. Improved alignment of previously noted intertrochanteric femur fracture. Regional soft tissues appear normal.  No radiopaque foreign body. IMPRESSION: Post ORIF of intertrochanteric femur fracture without evidence of complication. Electronically Signed   By: Simonne Come M.D.   On: 01/16/2017 14:38   ASSESSMENT AND PLAN:   Perry Bullock is a 56 y.o. male with a known history of asthma, COPD, hepatitis C, peripheral vascular disease, glaucoma, bipolar disorder, anxiety, and chronic pain presents to the emergency department for evaluation of hip  pain s/p fallWhile intoxicated.  In the ED he was found to have a mildly displaced intertroch/subtroch fracture of the right hip.  #. Right intertroch hip fracture Status post fall  -Postop day 1 s/p Reduction and internal fixation of displaced 2  part intertrochanteric right hip fracture with [employer] TFN nail. -Continue pain meds as needed. Patient is on chronic narcotics at home will resume those. -Physical therapy to see patient  #. History of COPD -Continue Spiriva, dulera, inhalers, Flonase, Zyrtec  #. History of bipolar disorder and anxiety -Continue Seroquel, trazodone  #. History of GERD - Continue Protonix, Carafate  #. History of glaucoma - Continue eyedrops  #. History of chronic pain - Continue gabapentin  #. History of peripheral vascular disease - We will resume aspirin  #. History of tobacco use disorder - Continue nicotine patch  Case discussed with Care Management/Social Worker. Management plans discussed with the patient, family and they are in agreement.  CODE STATUS: Full   DVT Prophylaxis: Lovenox  TOTAL TIME TAKING CARE OF THIS PATIENT: 25 minutes.  >50% time spent on counselling and coordination of care patient and parents  POSSIBLE D/C IN 1-2 DAYS, DEPENDING ON CLINICAL CONDITION.  Note: This dictation was prepared with Dragon dictation along with smaller phrase technology. Any transcriptional errors that result from this process are unintentional.  Avondre Richens M.D on 01/17/2017 at 1:51 PM  Between 7am to 6pm - Pager - [phone]  After 6pm go to [email or web address] - Social research officer, governmentpassword [email or web address]  [facility]  Office  [phone]  CC: Primary care physician; [facility]

## 2017-01-17 NOTE — Progress Notes (Signed)
   Subjective: 1 Day Post-Op Procedure(s) (LRB): INTRAMEDULLARY (IM) NAIL INTERTROCHANTRIC (Right) Patient reports pain as 6 on 0-10 scale.   Patient is well, and has had no acute complaints or problems Denies any anxiety,dizziness, CP, SOB, ABD pain. We will start therapy today.    Objective: Vital signs in last 24 hours: Temp:  [96.8 F (36 C)-99 F (37.2 C)] 98.5 F (36.9 C) (07/21 0720) Pulse Rate:  [63-124] 84 (07/21 0720) Resp:  [11-21] 18 (07/21 0720) BP: (94-151)/(57-96) 109/69 (07/21 0720) SpO2:  [90 %-100 %] 95 % (07/21 0720) Weight:  [76.7 kg (169 lb)] 76.7 kg (169 lb) (07/20 1234)  Intake/Output from previous day: 07/20 0701 - 07/21 0700 In: 3110 [P.O.:1420; I.V.:1490; IV Piggyback:200] Out: 1150 [Urine:1050; Blood:100] Intake/Output this shift: No intake/output data recorded.   Recent Labs  01/15/17 2126 01/16/17 0407 01/17/17 0329  HGB 12.2* 11.1* 9.7*    Recent Labs  01/16/17 0407 01/17/17 0329  WBC 11.7* 12.0*  RBC 3.27* 2.88*  HCT 32.8* 29.2*  PLT 249 206    Recent Labs  01/16/17 0407 01/17/17 0329  NA 136 135  K 4.2 4.8  CL 102 100*  CO2 29 29  BUN 9 11  CREATININE 0.83 0.75  GLUCOSE 122* 136*  CALCIUM 8.5* 8.1*    Recent Labs  01/15/17 2126  INR 0.96    EXAM General - Patient is Alert, Appropriate and Oriented Extremity - Neurovascular intact Sensation intact distally Intact pulses distally Dorsiflexion/Plantar flexion intact No cellulitis present Compartment soft Dressing - dressing C/D/I and no drainage Motor Function - intact, moving foot and toes well on exam.   Past Medical History:  Diagnosis Date  . Anxiety   . Arthritis   . Asthma   . Bipolar 1 disorder (HCC)   . Blind right eye   . Chronic back pain    Diverticulosis  . Chronic headaches   . Convulsion (HCC)    once a month when stands up too quickly  . COPD (chronic obstructive pulmonary disease) (HCC)   . DDD (degenerative disc disease), lumbar    . Diverticulitis   . GERD (gastroesophageal reflux disease)   . Glaucoma   . Hepatitis    hepatitis C  . Pneumonia   . PVD (peripheral vascular disease) (HCC)    lower extremities reddened and feet swollen  . Seasonal allergies   . Shortness of breath dyspnea     Assessment/Plan:   1 Day Post-Op Procedure(s) (LRB): INTRAMEDULLARY (IM) NAIL INTERTROCHANTRIC (Right) Active Problems:   Closed intertrochanteric fracture of hip, right, initial encounter (HCC)   Acute post op blood loss anemia with underlying chronic anemia  Estimated body mass index is 22.92 kg/m as calculated from the following:   Height as of this encounter: 6' (1.829 m).   Weight as of this encounter: 76.7 kg (169 lb). Advance diet Up with therapy  Needs BM Pain well controlled Acute post op blood loss anemia with underlying chronic anemia - Recheck CBC in the am. Patient asymptomatic.  CM to assist with discharge    DVT Prophylaxis - Lovenox, Foot Pumps and TED hose Weight-Bearing as tolerated to right leg   T. Cranston Neighborhris Asyah Candler, PA-C [facility] 01/17/2017, 7:28 AM

## 2017-01-18 LAB — BASIC METABOLIC PANEL
ANION GAP: 6 (ref 5–15)
BUN: 13 mg/dL (ref 6–20)
CALCIUM: 8.3 mg/dL — AB (ref 8.9–10.3)
CO2: 30 mmol/L (ref 22–32)
Chloride: 100 mmol/L — ABNORMAL LOW (ref 101–111)
Creatinine, Ser: 0.78 mg/dL (ref 0.61–1.24)
Glucose, Bld: 148 mg/dL — ABNORMAL HIGH (ref 65–99)
Potassium: 4.8 mmol/L (ref 3.5–5.1)
Sodium: 136 mmol/L (ref 135–145)

## 2017-01-18 LAB — CBC
HCT: 28.1 % — ABNORMAL LOW (ref 40.0–52.0)
HEMOGLOBIN: 9.4 g/dL — AB (ref 13.0–18.0)
MCH: 33.9 pg (ref 26.0–34.0)
MCHC: 33.4 g/dL (ref 32.0–36.0)
MCV: 101.6 fL — ABNORMAL HIGH (ref 80.0–100.0)
Platelets: 208 10*3/uL (ref 150–440)
RBC: 2.77 MIL/uL — ABNORMAL LOW (ref 4.40–5.90)
RDW: 13.8 % (ref 11.5–14.5)
WBC: 11.6 10*3/uL — ABNORMAL HIGH (ref 3.8–10.6)

## 2017-01-18 MED ORDER — OXYCODONE HCL 5 MG PO TABS: 5.0000 mg | ORAL_TABLET | ORAL | 0 refills | Status: DC | PRN

## 2017-01-18 MED ORDER — ENOXAPARIN SODIUM 40 MG/0.4ML ~~LOC~~ SOLN: 40.0000 mg | SUBCUTANEOUS | 0 refills | Status: DC

## 2017-01-18 NOTE — Discharge Instructions (Signed)
Diet: As you were doing prior to hospitalization   Shower:  May shower but keep the wounds dry, use an occlusive plastic wrap, NO SOAKING IN TUB.  If the bandage gets wet, change with a clean dry gauze.  Dressing:  You may change your dressing as needed. Change the dressing with sterile gauze dressing.  Home health should remove staples and apply steri strips on 01/30/2017  Activity:  Increase activity slowly as tolerated, but follow the weight bearing instructions below.  Weight Bearing:   Weight bearing as tolerated to Right lower extremity  To prevent constipation: you may use a stool softener such as -  Colace (over the counter) 100 mg by mouth twice a day  Drink plenty of fluids (prune juice may be helpful) and high fiber foods Miralax (over the counter) for constipation as needed.    Itching:  If you experience itching with your medications, try taking only a single pain pill, or even half a pain pill at a time.  You may take up to 10 pain pills per day, and you can also use benadryl over the counter for itching or also to help with sleep.   Precautions:  If you experience chest pain or shortness of breath - call 911 immediately for transfer to the hospital emergency department!!  If you develop a fever greater that 101 F, purulent drainage from wound, increased redness or drainage from wound, or calf pain-Call [facility]                                               Follow- Up Appointment:  Please call for an [facility] to [facility]

## 2017-01-18 NOTE — Anesthesia Postprocedure Evaluation (Signed)
Anesthesia Post Note  Patient: Perry Bullock  Procedure(s) Performed: Procedure(s) (LRB): INTRAMEDULLARY (IM) NAIL INTERTROCHANTRIC (Right)  Patient location during evaluation: Nursing Unit Anesthesia Type: Spinal Level of consciousness: oriented and awake and alert Pain management: pain level controlled Vital Signs Assessment: post-procedure vital signs reviewed and stable Respiratory status: spontaneous breathing, respiratory function stable and patient connected to nasal cannula oxygen Cardiovascular status: blood pressure returned to baseline and stable Postop Assessment: no headache and no backache Anesthetic complications: no     Last Vitals:  Vitals:   01/18/17 0116 01/18/17 0727  BP: 107/77 110/70  Pulse: (!) 105 99  Resp: 20 19  Temp: 37.7 C 37.2 C    Last Pain:  Vitals:   01/18/17 0727  TempSrc: Oral  PainSc:                  Rica MastBachich,  Ioanna Colquhoun M

## 2017-01-18 NOTE — Progress Notes (Signed)
   Subjective: 2 Days Post-Op Procedure(s) (LRB): INTRAMEDULLARY (IM) NAIL INTERTROCHANTRIC (Right) Patient reports pain as moderate.   Patient is well, and has had no acute complaints or problems Denies any CP, SOB, ABD pain. Patient tolerated PT well.    Objective: Vital signs in last 24 hours: Temp:  [98.1 F (36.7 C)-99.8 F (37.7 C)] 99 F (37.2 C) (07/22 0727) Pulse Rate:  [99-105] 99 (07/22 0727) Resp:  [18-20] 19 (07/22 0727) BP: (107-116)/(70-85) 110/70 (07/22 0727) SpO2:  [92 %-99 %] 92 % (07/22 0727)  Intake/Output from previous day: 07/21 0701 - 07/22 0700 In: 240 [P.O.:240] Out: 2710 [Urine:2710] Intake/Output this shift: No intake/output data recorded.   Recent Labs  01/15/17 2126 01/16/17 0407 01/17/17 0329 01/18/17 0340  HGB 12.2* 11.1* 9.7* 9.4*    Recent Labs  01/17/17 0329 01/18/17 0340  WBC 12.0* 11.6*  RBC 2.88* 2.77*  HCT 29.2* 28.1*  PLT 206 208    Recent Labs  01/17/17 0329 01/18/17 0340  NA 135 136  K 4.8 4.8  CL 100* 100*  CO2 29 30  BUN 11 13  CREATININE 0.75 0.78  GLUCOSE 136* 148*  CALCIUM 8.1* 8.3*    Recent Labs  01/15/17 2126  INR 0.96    EXAM General - Patient is Alert, Appropriate and Oriented Extremity - Neurovascular intact Sensation intact distally Intact pulses distally Dorsiflexion/Plantar flexion intact No cellulitis present Compartment soft Dressing - dressing C/D/I, scant drainage and new dressings applied Motor Function - intact, moving foot and toes well on exam.   Past Medical History:  Diagnosis Date  . Anxiety   . Arthritis   . Asthma   . Bipolar 1 disorder (HCC)   . Blind right eye   . Chronic back pain    Diverticulosis  . Chronic headaches   . Convulsion (HCC)    once a month when stands up too quickly  . COPD (chronic obstructive pulmonary disease) (HCC)   . DDD (degenerative disc disease), lumbar   . Diverticulitis   . GERD (gastroesophageal reflux disease)   . Glaucoma    . Hepatitis    hepatitis C  . Pneumonia   . PVD (peripheral vascular disease) (HCC)    lower extremities reddened and feet swollen  . Seasonal allergies   . Shortness of breath dyspnea     Assessment/Plan:   2 Days Post-Op Procedure(s) (LRB): INTRAMEDULLARY (IM) NAIL INTERTROCHANTRIC (Right) Active Problems:   Closed intertrochanteric fracture of hip, right, initial encounter (HCC)   Acute post op blood loss anemia with underlying chronic anemia  Estimated body mass index is 22.92 kg/m as calculated from the following:   Height as of this encounter: 6' (1.829 m).   Weight as of this encounter: 76.7 kg (169 lb). Advance diet Up with therapy  Pain well controlled Acute post op blood loss anemia with underlying chronic anemia - Hgb/Hct stable. CM to assist with discharge to home with HHPT pending medical clearance  Please have home health remove staples and apply steri strips on 01/30/17 Follow up with [facility] in 6 weeks Lovenox 40mg  SQ daily x 14 days at discharge     DVT Prophylaxis - Lovenox, Foot Pumps and TED hose Weight-Bearing as tolerated to right leg   T. Cranston Neighborhris Gaines, PA-C [facility] 01/18/2017, 7:36 AM

## 2017-01-18 NOTE — Progress Notes (Addendum)
Physical Therapy Treatment Patient Details Name: Perry CuminsGary L Valadez MRN: [id number] DOB: 1961/01/26 Today's Date: 01/18/2017    History of Present Illness Patient is a 56 y.o. male admitted on 19 JUL after falling while intoxicated, fracturing R hip. Patient s/p R hip intramedullary nailing by Dr. Joice LoftsPoggi on 20 JUL. 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     PT Comments    Pt in bed sleeping upon arrival.  Pt complained of continued "popping" sensation in LE last night during self initiated exercises "I stopped because it was popping again".  Pt in 8/10 pain and nursing in to give pain medication at start of session.  Pt noted to have wet underpad from leaking ice pack.  Rolling left and right to change pad and pt c/o 10/10 pain during task and repositioning of pillows.  Stated continued "popping" sensation.  Discussed with primary nurse and primary PT.  Will defer mobility this am and await ortho MD to check.  Pt stated sensation is in medial and lateral hip to knee.   Follow Up Recommendations  Home health PT     Equipment Recommendations  Rolling walker with 5" wheels    Recommendations for Other Services       Precautions / Restrictions Precautions Precautions: Fall Restrictions Weight Bearing Restrictions: Yes Other Position/Activity Restrictions: WBAT RLE    Mobility  Bed Mobility Overal bed mobility: Needs Assistance Bed Mobility: Rolling Rolling: Mod assist         General bed mobility comments: Pain with rolling left/rigth to change pad due to leaking ice pack  Transfers                 General transfer comment: deferred due to pain  Ambulation/Gait                 Stairs            Wheelchair Mobility    Modified Rankin (Stroke Patients Only)       Balance                                            Cognition Arousal/Alertness: Awake/alert Behavior  During Therapy: WFL for tasks assessed/performed Overall Cognitive Status: Within Functional Limits for tasks assessed                                        Exercises      General Comments        Pertinent Vitals/Pain Pain Assessment: 0-10 Pain Score: 9  Pain Location: R hip Pain Descriptors / Indicators: Operative site guarding;Sore;Shooting Pain Intervention(s): Limited activity within patient's tolerance;Monitored during session;RN gave pain meds during session    Home Living                      Prior Function            PT Goals (current goals can now be found in the care plan section) Progress towards PT goals: Progressing toward goals    Frequency    BID      PT Plan Current plan remains appropriate    Co-evaluation              AM-PAC PT "6  Clicks" Daily Activity  Outcome Measure  Difficulty turning over in bed (including adjusting bedclothes, sheets and blankets)?: Total Difficulty moving from lying on back to sitting on the side of the bed? : Total Difficulty sitting down on and standing up from a chair with arms (e.g., wheelchair, bedside commode, etc,.)?: Total Help needed moving to and from a bed to chair (including a wheelchair)?: A Lot Help needed walking in hospital room?: A Lot Help needed climbing 3-5 steps with a railing? : Total 6 Click Score: 8    End of Session   Activity Tolerance: Patient limited by pain Patient left: in bed;with bed alarm set;with call bell/phone within reach Nurse Communication: Other (comment) Pain - Right/Left: Right Pain - part of body: Hip     Time: 1047-1100 PT Time Calculation (min) (ACUTE ONLY): 13 min  Charges:  $Therapeutic Exercise: 8-22 mins                    G Codes:       Danielle Dess, PTA 01/18/17, 11:06 AM

## 2017-01-18 NOTE — Progress Notes (Signed)
[facility]   PATIENT NAME: Perry Bullock    MR#:  [id number]  DATE OF BIRTH:  1960-08-18  SUBJECTIVE:   Patient doing well postoperatively 2. C/o hip joint 'poppoing"  REVIEW OF SYSTEMS:   Review of Systems  Constitutional: Negative for chills, fever and weight loss.  HENT: Negative for ear discharge, ear pain and nosebleeds.   Eyes: Negative for blurred vision, pain and discharge.  Respiratory: Negative for sputum production, shortness of breath, wheezing and stridor.   Cardiovascular: Negative for chest pain, palpitations, orthopnea and PND.  Gastrointestinal: Negative for abdominal pain, diarrhea, nausea and vomiting.  Genitourinary: Negative for frequency and urgency.  Musculoskeletal: Positive for joint pain. Negative for back pain.  Neurological: Positive for weakness. Negative for sensory change, speech change and focal weakness.  Psychiatric/Behavioral: Negative for depression and hallucinations. The patient is not nervous/anxious.    Tolerating Diet:yes Tolerating PT: HHPT  DRUG ALLERGIES:   Allergies  Allergen Reactions  . Ace Inhibitors Swelling    VITALS:  Blood pressure 112/73, pulse (!) 103, temperature 99 F (37.2 C), temperature source Oral, resp. rate 18, height 6' (1.829 m), weight 76.7 kg (169 lb), SpO2 91 %.  PHYSICAL EXAMINATION:   Physical Exam  GENERAL:  56 y.o.-year-old patient lying in the bed with no acute distress.  EYES: Pupils equal, round, reactive to light and accommodation. No scleral icterus. Extraocular muscles intact.  HEENT: Head atraumatic, normocephalic. Oropharynx and nasopharynx clear.  NECK:  Supple, no jugular venous distention. No thyroid enlargement, no tenderness.  LUNGS: Normal breath sounds bilaterally, no wheezing, rales, rhonchi. No use of accessory muscles of respiration.  CARDIOVASCULAR: S1, S2 normal. No murmurs, rubs, or gallops.  ABDOMEN: Soft, nontender,  nondistended. Bowel sounds present. No organomegaly or mass.  EXTREMITIES: No cyanosis, clubbing or edema b/l.    NEUROLOGIC: Cranial nerves II through XII are intact. No focal Motor or sensory deficits b/l.   PSYCHIATRIC:  patient is alert and oriented x 3.  SKIN: No obvious rash, lesion, or ulcer.   LABORATORY PANEL:  CBC  Recent Labs Lab 01/18/17 0340  WBC 11.6*  HGB 9.4*  HCT 28.1*  PLT 208    Chemistries   Recent Labs Lab 01/18/17 0340  NA 136  K 4.8  CL 100*  CO2 30  GLUCOSE 148*  BUN 13  CREATININE 0.78  CALCIUM 8.3*   Cardiac Enzymes No results for input(s): TROPONINI in the last 168 hours. RADIOLOGY:  No results found. ASSESSMENT AND PLAN:   Perry Bullock is a 56 y.o. male with a known history of asthma, COPD, hepatitis C, peripheral vascular disease, glaucoma, bipolar disorder, anxiety, and chronic pain presents to the emergency department for evaluation of hip pain s/p fallWhile intoxicated.  In the ED he was found to have a mildly displaced intertroch/subtroch fracture of the right hip.  #. Right intertroch hip fracture Status post fall  -Postop day 2 s/p Reduction and internal fixation of displaced 2 part intertrochanteric right hip fracture with [employer] TFN nail. -Continue pain meds as needed. Patient is on chronic narcotics at home will resume those. -Physical therapy recommends HHPT  #. History of COPD -Continue Spiriva, dulera, inhalers, Flonase, Zyrtec  #. History of bipolar disorder and anxiety -Continue Seroquel, trazodone  #. History of GERD - Continue Protonix, Carafate  #. History of glaucoma - Continue eyedrops  #. History of chronic pain - Continue gabapentin  #. History of peripheral vascular disease -  We will resume aspirin  #. History of tobacco use disorder - Continue nicotine patch  Case discussed with Care Management/Social Worker. Management plans discussed with the patient, family and they are in  agreement.  CODE STATUS: Full   DVT Prophylaxis: Lovenox  TOTAL TIME TAKING CARE OF THIS PATIENT: 25 minutes.  >50% time spent on counselling and coordination of care patient and parents  POSSIBLE D/C IN 1-2 DAYS, DEPENDING ON CLINICAL CONDITION.  Note: This dictation was prepared with Dragon dictation along with smaller phrase technology. Any transcriptional errors that result from this process are unintentional.  Perry Bullock M.D on 01/18/2017 at 8:16 PM  Between 7am to 6pm - Pager - [phone]  After 6pm go to [email or web address] - Social research officer, governmentpassword [email or web address]  [facility]  Office  [phone]  CC: Primary care physician; [facility]

## 2017-01-19 ENCOUNTER — Encounter: Payer: Self-pay | Admitting: Surgery

## 2017-01-19 LAB — BASIC METABOLIC PANEL
ANION GAP: 7 (ref 5–15)
BUN: 14 mg/dL (ref 6–20)
CHLORIDE: 98 mmol/L — AB (ref 101–111)
CO2: 29 mmol/L (ref 22–32)
Calcium: 8.6 mg/dL — ABNORMAL LOW (ref 8.9–10.3)
Creatinine, Ser: 0.84 mg/dL (ref 0.61–1.24)
Glucose, Bld: 124 mg/dL — ABNORMAL HIGH (ref 65–99)
POTASSIUM: 4.4 mmol/L (ref 3.5–5.1)
SODIUM: 134 mmol/L — AB (ref 135–145)

## 2017-01-19 MED ORDER — MORPHINE SULFATE ER 30 MG PO TBCR: 30.0000 mg | EXTENDED_RELEASE_TABLET | Freq: Every day | ORAL | 0 refills | Status: DC

## 2017-01-19 NOTE — Discharge Summary (Signed)
[facility] [place] at [facility]   PATIENT NAME: Perry Bullock    MR#:  [id number]  DATE OF BIRTH:  08/08/1960  DATE OF ADMISSION:  01/15/2017 ADMITTING PHYSICIAN: Tonye Royalty, DO  DATE OF DISCHARGE: 01/19/17  PRIMARY CARE PHYSICIAN: [facility]    ADMISSION DIAGNOSIS:  Fall, initial encounter [W19.XXXA] Closed fracture of right hip, initial encounter (HCC) [S72.001A] Alcoholic intoxication without complication (HCC) [F10.920]  DISCHARGE DIAGNOSIS:  Closed fracture of Right HIP s/p mechanical fall s/p Reduction and internal fixation of displaced 2 part intertrochanteric right hip fracture with nail Acute ETOH intoxication Chronic Narcotic dependence---on po morphine (pain clinic)  SECONDARY DIAGNOSIS:   Past Medical History:  Diagnosis Date  . Anxiety   . Arthritis   . Asthma   . Bipolar 1 disorder (HCC)   . Blind right eye   . Chronic back pain    Diverticulosis  . Chronic headaches   . Convulsion (HCC)    once a month when stands up too quickly  . COPD (chronic obstructive pulmonary disease) (HCC)   . DDD (degenerative disc disease), lumbar   . Diverticulitis   . GERD (gastroesophageal reflux disease)   . Glaucoma   . Hepatitis    hepatitis C  . Pneumonia   . PVD (peripheral vascular disease) (HCC)    lower extremities reddened and feet swollen  . Seasonal allergies   . Shortness of breath dyspnea     HOSPITAL COURSE:   Perry Bullock a 56 y.o.malewith a known history of asthma, COPD, hepatitis C, peripheral vascular disease, glaucoma, bipolar disorder, anxiety, and chronic painpresents to the emergency department for evaluation of hip pain s/p fallWhile intoxicated. In the ED he was found to have a mildly displaced intertroch/subtroch fracture of the right hip.  #. Right intertroch hip fracture Status post fall with acute ETOH intoxication -Postop day 2 s/p Reduction and internal fixation of  displaced 2 part intertrochanteric righthip fracture with [employer] TFN nail. -Continue pain meds as needed. Patient is on chronic narcotics at home will resume those.pt follows at the pain clinic -Physical therapy recommends HHPT  #. History of COPD -Continue Spiriva, dulera, inhalers, Flonase, Zyrtec  #. History of bipolar disorder and anxiety -Continue Seroquel, trazodone  #. History of GERD - Continue Protonix, Carafate  #. History of glaucoma - Continue eyedrops  #. History of chronic pain - Continue gabapentin  #. History of peripheral vascular disease - We will resume aspirin  #. History of tobacco use disorder - Continue nicotine patch  Ok from ortho standpoint for d/c CONSULTS OBTAINED:  Treatment Team:  Poggi, Excell Seltzer, MD  DRUG ALLERGIES:   Allergies  Allergen Reactions  . Ace Inhibitors Swelling    DISCHARGE MEDICATIONS:   Current Discharge Medication List    START taking these medications   Details  enoxaparin (LOVENOX) 40 MG/0.4ML injection Inject 0.4 mLs (40 mg total) into the skin daily. Qty: 14 Syringe, Refills: 0    morphine (MS CONTIN) 30 MG 12 hr tablet Take 1 tablet (30 mg total) by mouth daily. Qty: 5 tablet, Refills: 0    oxyCODONE (OXY IR/ROXICODONE) 5 MG immediate release tablet Take 1-2 tablets (5-10 mg total) by mouth every 4 (four) hours as needed for moderate pain. Qty: 40 tablet, Refills: 0      CONTINUE these medications which have NOT CHANGED   Details  albuterol (PROVENTIL) (2.5 MG/3ML) 0.083% nebulizer solution Take 3 mLs (2.5 mg total) by nebulization every  6 (six) hours as needed for wheezing or shortness of breath. Qty: 75 mL, Refills: 2    aspirin 81 MG chewable tablet Chew 81 mg by mouth.    Carboxymethylcellulose Sodium (THERATEARS) 0.25 % SOLN Apply 2 drops to eye 4 (four) times daily.    cetirizine (ZYRTEC) 10 MG tablet Take 10 mg by mouth daily.    fluticasone (FLONASE) 50 MCG/ACT nasal spray Place 2  sprays into both nostrils as needed.     gabapentin (NEURONTIN) 300 MG capsule Take 900 mg by mouth 3 (three) times daily.     latanoprost (XALATAN) 0.005 % ophthalmic solution Place 1 drop into both eyes at bedtime.    mometasone-formoterol (DULERA) 200-5 MCG/ACT AERO Inhale 2 puffs into the lungs 2 (two) times daily. Qty: 1 Inhaler, Refills: 11    nicotine (NICODERM CQ - DOSED IN MG/24 HOURS) 21 mg/24hr patch Place 1 patch (21 mg total) onto the skin daily. Qty: 30 patch, Refills: 2    ondansetron (ZOFRAN) 4 MG tablet Take 1 tablet (4 mg total) by mouth every 8 (eight) hours as needed for nausea. Qty: 20 tablet, Refills: 0    pantoprazole (PROTONIX) 40 MG tablet Take 40 mg by mouth daily.    Polyethyl Glycol-Propyl Glycol (SYSTANE) 0.4-0.3 % SOLN Apply 1 drop to eye.    QUEtiapine (SEROQUEL) 300 MG tablet Take 300 mg by mouth at bedtime.    sucralfate (CARAFATE) 1 g tablet Take 1 g by mouth 4 (four) times daily -  with meals and at bedtime.    tiotropium (SPIRIVA) 18 MCG inhalation capsule Place 1 capsule (18 mcg total) into inhaler and inhale daily. Qty: 30 capsule, Refills: 11    traZODone (DESYREL) 150 MG tablet Take 150 mg by mouth at bedtime.    zolpidem (AMBIEN) 10 MG tablet Take 10 mg by mouth at bedtime as needed for sleep.    albuterol (PROAIR HFA) 108 (90 Base) MCG/ACT inhaler Inhale 2 puffs into the lungs every 6 (six) hours as needed for wheezing or shortness of breath. Qty: 1 Inhaler, Refills: 11        If you experience worsening of your admission symptoms, develop shortness of breath, life threatening emergency, suicidal or homicidal thoughts you must seek medical attention immediately by calling 911 or calling your MD immediately  if symptoms less severe.  You Must read complete instructions/literature along with all the possible adverse reactions/side effects for all the Medicines you take and that have been prescribed to you. Take any new Medicines after you  have completely understood and accept all the possible adverse reactions/side effects.   Please note  You were cared for by a hospitalist during your hospital stay. If you have any questions about your discharge medications or the care you received while you were in the hospital after you are discharged, you can call the unit and asked to speak with the hospitalist on call if the hospitalist that took care of you is not available. Once you are discharged, your primary care physician will handle any further medical issues. Please note that NO REFILLS for any discharge medications will be authorized once you are discharged, as it is imperative that you return to your primary care physician (or establish a relationship with a primary care physician if you do not have one) for your aftercare needs so that they can reassess your need for medications and monitor your lab values. Today   SUBJECTIVE   No new complaints  VITAL SIGNS:  Blood pressure 118/62, pulse (!) 107, temperature 97.9 F (36.6 C), temperature source Oral, resp. rate 18, height 6' (1.829 m), weight 76.7 kg (169 lb), SpO2 (!) 89 %.  I/O:   Intake/Output Summary (Last 24 hours) at 01/19/17 1311 Last data filed at 01/19/17 0800  Gross per 24 hour  Intake             1680 ml  Output             3610 ml  Net            -1930 ml    PHYSICAL EXAMINATION:  GENERAL:  56 y.o.-year-old patient lying in the bed with no acute distress.  EYES: Pupils equal, round, reactive to light and accommodation. No scleral icterus. Extraocular muscles intact.  HEENT: Head atraumatic, normocephalic. Oropharynx and nasopharynx clear.  NECK:  Supple, no jugular venous distention. No thyroid enlargement, no tenderness.  LUNGS: Normal breath sounds bilaterally, no wheezing, rales,rhonchi or crepitation. No use of accessory muscles of respiration.  CARDIOVASCULAR: S1, S2 normal. No murmurs, rubs, or gallops.  ABDOMEN: Soft, non-tender, non-distended.  Bowel sounds present. No organomegaly or mass.  EXTREMITIES: No pedal edema, cyanosis, or clubbing.  NEUROLOGIC: Cranial nerves II through XII are intact. Muscle strength 5/5 in all extremities. Sensation intact. Gait not checked.  PSYCHIATRIC: The patient is alert and oriented x 3.  SKIN: No obvious rash, lesion, or ulcer.   DATA REVIEW:   CBC   Recent Labs Lab 01/18/17 0340  WBC 11.6*  HGB 9.4*  HCT 28.1*  PLT 208    Chemistries   Recent Labs Lab 01/19/17 0315  NA 134*  K 4.4  CL 98*  CO2 29  GLUCOSE 124*  BUN 14  CREATININE 0.84  CALCIUM 8.6*    Microbiology Results   Recent Results (from the past 240 hour(s))  Surgical pcr screen     Status: None   Collection Time: 01/15/17 11:15 PM  Result Value Ref Range Status   MRSA, PCR NEGATIVE NEGATIVE Final   Staphylococcus aureus NEGATIVE NEGATIVE Final    Comment:        The Xpert SA Assay (FDA approved for NASAL specimens in patients over 96 years of age), is one component of a comprehensive surveillance program.  Test performance has been validated by [facility] for patients greater than or equal to 68 year old. It is not intended to diagnose infection nor to guide or monitor treatment.     RADIOLOGY:  No results found.   Management plans discussed with the patient, family and they are in agreement.  CODE STATUS:     Code Status Orders        Start     Ordered   01/15/17 2320  Full code  Continuous     01/15/17 2319    Code Status History    Date Active Date Inactive Code Status Order ID Comments User Context   04/22/2015  2:45 AM 04/25/2015  4:42 PM Full Code [id number]  Wyatt Haste, MD ED   04/03/2015  7:32 AM 04/06/2015  5:43 PM Full Code [id number]  Crissie Figures, MD Inpatient    Advance Directive Documentation     Most Recent Value  Type of Advance Directive  Healthcare Power of Attorney  Pre-existing out of facility DNR order (yellow form or pink MOST form)  -  "MOST" Form in  Place?  -      TOTAL TIME TAKING CARE OF THIS PATIENT: *  40* minutes.    Trysten Berti M.D on 01/19/2017 at 1:11 PM  Between 7am to 6pm - Pager - [phone] After 6pm go to [email or web address] - Social research officer, government  [facility]  Office  [phone]  CC: Primary care physician; [facility]

## 2017-01-19 NOTE — Progress Notes (Signed)
Physical Therapy Treatment Patient Details Name: Perry CuminsGary L Shonk MRN: [id number] DOB: 07-22-1960 Today's Date: 01/19/2017    History of Present Illness Patient is a 56 y.o. male admitted on 19 JUL after falling while intoxicated, fracturing R hip. Patient s/p R hip intramedullary nailing by Dr. Joice LoftsPoggi on 20 JUL. 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     PT Comments    Pt agreeable to PT; reports continued 7/10 pain in R hip/thight to knee (post medication 1 hour prior). Pt progressing ambulation distance; however, slow and requires education on proper sequence and rolling walker placement in regards to body. Pt performs steps with sideways technique; increased time and some difficulty due to pain/shortness of breath. O2 saturation post ambulation/steps is 93% and heart rate 127 bpm. Decreases to baseline prior to treatment withing 4 minutes. Pt requires cues throughout session for best/safest practice of mobility and is inconsistent with compliance. Pt does feel he will be able to manage at home with some help from family. Pt has current discharge home with home health PT to continue progression of strength, endurance, balance and safety.    Follow Up Recommendations  Home health PT     Equipment Recommendations  Rolling walker with 5" wheels    Recommendations for Other Services       Precautions / Restrictions Precautions Precautions: Fall Restrictions Weight Bearing Restrictions: Yes Other Position/Activity Restrictions: WBAT RLE    Mobility  Bed Mobility Overal bed mobility: Needs Assistance Bed Mobility: Supine to Sit;Sit to Supine     Supine to sit: Min assist Sit to supine: Min assist   General bed mobility comments: Min A for RLE; significant increased time  Transfers Overall transfer level: Needs assistance Equipment used: Rolling walker (2 wheeled) Transfers: Sit to/from Stand Sit to Stand: Min  assist         General transfer comment: Cues for hand placement and rough transitioning of hands to rw with stand. Increased time to steady self.   Ambulation/Gait Ambulation/Gait assistance: Min guard Ambulation Distance (Feet): 72 Feet Assistive device: Rolling walker (2 wheeled) (New walker adjusted to appropriate height; improved comfort) Gait Pattern/deviations: Step-through pattern;Antalgic;Trunk flexed Gait velocity: slow Gait velocity interpretation: <1.8 ft/sec, indicative of risk for recurrent falls General Gait Details: slow, inconsistent cadence. Requires cueing for rw placement in regards to body.    Stairs Stairs: Yes   Stair Management: One rail Right;One rail Left;Step to pattern;Sideways Number of Stairs: 3 General stair comments: Education on proper sidestep sequence with 2 hands on 1 rail. Post stairs, HR increased to 127 bpm.   Wheelchair Mobility    Modified Rankin (Stroke Patients Only)       Balance Overall balance assessment: Needs assistance Sitting-balance support: Bilateral upper extremity supported;Feet supported Sitting balance-Leahy Scale: Good     Standing balance support: Bilateral upper extremity supported Standing balance-Leahy Scale: Fair                              Cognition Arousal/Alertness: Awake/alert Behavior During Therapy: WFL for tasks assessed/performed Overall Cognitive Status: Within Functional Limits for tasks assessed                                        Exercises General Exercises - Lower Extremity Ankle Circles/Pumps: AROM;Both;20 reps;Supine Quad Sets: Strengthening;Both;20  reps;Supine Gluteal Sets: Strengthening;Both;20 reps;Supine Short Arc Quad: AROM;Both;20 reps;Supine Heel Slides: AAROM;Right;20 reps;Supine (AROM L) Hip ABduction/ADduction: AAROM;Right;20 reps;Supine (AROM L) Straight Leg Raises: AAROM;Right;10 reps;Supine (2 sets; strengthening L)    General Comments         Pertinent Vitals/Pain Pain Assessment: 0-10 Pain Score: 7  Pain Location: R hip to knee (Also R shoulder from previous sh replacement surgery) Pain Descriptors / Indicators: Constant;Aching;Sharp;Shooting    Home Living                      Prior Function            PT Goals (current goals can now be found in the care plan section) Progress towards PT goals: Progressing toward goals    Frequency    BID      PT Plan Current plan remains appropriate    Co-evaluation              AM-PAC PT "6 Clicks" Daily Activity  Outcome Measure  Difficulty turning over in bed (including adjusting bedclothes, sheets and blankets)?: Total Difficulty moving from lying on back to sitting on the side of the bed? : Total Difficulty sitting down on and standing up from a chair with arms (e.g., wheelchair, bedside commode, etc,.)?: Total Help needed moving to and from a bed to chair (including a wheelchair)?: A Little Help needed walking in hospital room?: A Little Help needed climbing 3-5 steps with a railing? : A Little 6 Click Score: 12    End of Session   Activity Tolerance: Patient limited by lethargy;Patient limited by fatigue;Patient limited by pain Patient left: in bed;with bed alarm set;with call bell/phone within reach   PT Visit Diagnosis: Muscle weakness (generalized) (M62.81);History of falling (Z91.81);Difficulty in walking, not elsewhere classified (R26.2);Pain Pain - Right/Left: Right Pain - part of body: Hip     Time: 1610-9604 PT Time Calculation (min) (ACUTE ONLY): 34 min  Charges:  $Gait Training: 23-37 mins $Therapeutic Exercise: 23-37 mins                    G Codes:        Scot Dock, PTA 01/19/2017, 2:10 PM

## 2017-01-19 NOTE — Progress Notes (Signed)
   Subjective: 3 Days Post-Op Procedure(s) (LRB): INTRAMEDULLARY (IM) NAIL INTERTROCHANTRIC (Right) Patient reports pain as 7 on 0-10 scale.   Patient is well, and has had no acute complaints or problems. Low grade temp last night. Patient not performing Incentive spirometer.   Denies any CP, SOB, ABD pain. Patient complaining of clicking with in the right hip.   Objective: Vital signs in last 24 hours: Temp:  [97.9 F (36.6 C)-100.6 F (38.1 C)] 97.9 F (36.6 C) (07/23 0520) Pulse Rate:  [103-114] 114 (07/22 2320) Resp:  [18] 18 (07/22 2320) BP: (88-118)/(62-73) 118/62 (07/22 2323) SpO2:  [91 %-95 %] 93 % (07/22 2320)  Intake/Output from previous day: 07/22 0701 - 07/23 0700 In: 1440 [P.O.:1440] Out: 4535 [Urine:4535] Intake/Output this shift: No intake/output data recorded.   Recent Labs  01/17/17 0329 01/18/17 0340  HGB 9.7* 9.4*    Recent Labs  01/17/17 0329 01/18/17 0340  WBC 12.0* 11.6*  RBC 2.88* 2.77*  HCT 29.2* 28.1*  PLT 206 208    Recent Labs  01/18/17 0340 01/19/17 0315  NA 136 134*  K 4.8 4.4  CL 100* 98*  CO2 30 29  BUN 13 14  CREATININE 0.78 0.84  GLUCOSE 148* 124*  CALCIUM 8.3* 8.6*   No results for input(s): LABPT, INR in the last 72 hours.  EXAM General - Patient is Alert, Appropriate and Oriented Extremity - Neurovascular intact Sensation intact distally Intact pulses distally Dorsiflexion/Plantar flexion intact No cellulitis present Compartment soft Dressing - dressing C/D/I and scant drainage Motor Function - intact, moving foot and toes well on exam.   Past Medical History:  Diagnosis Date  . Anxiety   . Arthritis   . Asthma   . Bipolar 1 disorder (HCC)   . Blind right eye   . Chronic back pain    Diverticulosis  . Chronic headaches   . Convulsion (HCC)    once a month when stands up too quickly  . COPD (chronic obstructive pulmonary disease) (HCC)   . DDD (degenerative disc disease), lumbar   . Diverticulitis    . GERD (gastroesophageal reflux disease)   . Glaucoma   . Hepatitis    hepatitis C  . Pneumonia   . PVD (peripheral vascular disease) (HCC)    lower extremities reddened and feet swollen  . Seasonal allergies   . Shortness of breath dyspnea     Assessment/Plan:   3 Days Post-Op Procedure(s) (LRB): INTRAMEDULLARY (IM) NAIL INTERTROCHANTRIC (Right) Active Problems:   Closed intertrochanteric fracture of hip, right, initial encounter (HCC)   Acute post op blood loss anemia with underlying chronic anemia  Estimated body mass index is 22.92 kg/m as calculated from the following:   Height as of this encounter: 6' (1.829 m).   Weight as of this encounter: 76.7 kg (169 lb). Advance diet Up with therapy  Pain controlled. Continue with current pain regimen Encouraged incentive spirometer, continue to monitor VS Acute post op blood loss anemia with underlying chronic anemia - Hgb/Hct stable. CM to assist with discharge to home with HHPT pending medical clearance  Please have home health remove staples and apply steri strips on 01/30/17 Follow up with [facility] in 6 weeks Lovenox 40mg  SQ daily x 14 days at discharge     DVT Prophylaxis - Lovenox, Foot Pumps and TED hose Weight-Bearing as tolerated to right leg   T. [place] Neighborhris Dominion Kathan, PA-C [facility] 01/19/2017, 8:01 AM

## 2017-01-19 NOTE — Care Management Note (Addendum)
Case Management Note  Patient Details  Name: Perry Bullock MRN: [id number] Date of Birth: 1960-10-25  Subjective/Objective: Admitted following a fall with a right intertrochanteric hip fracture. POD # 3 for right IM nailing. Patient lives at home with his wife. PT recommending home health PT. Met with patient at bedside. Offered choice of home health agencies. He states his home O2 is through [facility] so he prefers them. Referral to [facility] with [facility]. Also ordered a walker from [facility]. Answered all questions. PCP is with [facility]. Pharmacy _ [phone]. Called Lovenox 40 mg # 14 no refills.                    Action/Plan: [facility] for PT and walker. Lovenox called in.   Expected Discharge Date:                  Expected Discharge Plan:  [facility]  In-House Referral:     Discharge planning Services  CM Consult  Post Acute Care Choice:  Durable Medical Equipment, Home Health Choice offered to:  Patient  DME Arranged:  Walker rolling DME Agency:  [place] Arranged:  PT [facility] Agency:  [place]  Status of Service:  Completed, signed off  If discussed at [place] of Stay Meetings, dates discussed:    Additional Comments:  Jolly Mango, RN 01/19/2017, 12:02 PM

## 2017-01-19 NOTE — Progress Notes (Signed)
PT is recommending home health. RN case manager aware of above. Please reconsult if future social work needs arise. CSW signing off.   Linnie Delgrande, LCSW [phone]  

## 2017-01-19 NOTE — Progress Notes (Signed)
Discharge summary reviewed with patient and spouse with verbal understanding, answered all questions. 2 narcotics Rxs given upon discharge, walker and belongings given upon discharge. Escorted to personal vehicle via wc by ortho staff.

## 2017-01-19 NOTE — Progress Notes (Signed)
Physical Therapy Treatment Patient Details Name: Perry Bullock MRN: [id number] DOB: [birth year] Today's Date: 01/19/2017    History of Present Illness Patient is a 56 y.o. male admitted on 19 JUL after falling while intoxicated, fracturing R hip. Patient s/p R hip intramedullary nailing by Dr. Joice Lofts on 20 JUL. 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     PT Comments    Pt sleeping initially; awoken through voice. Reports 7/10 pain R hip to knee; also notes R shoulder pain from previous shoulder replacement surgery in April. Pt agreeable to bed exercises only this session. Performs with assist as needed; experiences discomfort. Pt reports "popping" in R hip; not audible or felt by therapist with exercises. Pt notes he did discuss with MD. Discussed need for improved ambulation distance, frequency and performing stairs to all discharge home. Pt wishes to do so this afternoon. Continue PT to progress range, strength, endurance to improve all functional mobility.    Follow Up Recommendations  Home health PT     Equipment Recommendations  Rolling walker with 5" wheels    Recommendations for Other Services       Precautions / Restrictions Precautions Precautions: Fall Restrictions Weight Bearing Restrictions: Yes Other Position/Activity Restrictions: WBAT RLE    Mobility  Bed Mobility               General bed mobility comments: Not tested; pt refuses out of bed this session  Transfers                    Ambulation/Gait                 Stairs            Wheelchair Mobility    Modified Rankin (Stroke Patients Only)       Balance                                            Cognition Arousal/Alertness: Lethargic Behavior During Therapy: WFL for tasks assessed/performed Overall Cognitive Status: Within Functional Limits for tasks assessed                                         Exercises General Exercises - Lower Extremity Ankle Circles/Pumps: AROM;Both;20 reps;Supine Quad Sets: Strengthening;Both;20 reps;Supine Gluteal Sets: Strengthening;Both;20 reps;Supine Short Arc Quad: AROM;Both;20 reps;Supine Heel Slides: AAROM;Right;20 reps;Supine (AROM L) Hip ABduction/ADduction: AAROM;Right;20 reps;Supine (AROM L) Straight Leg Raises: AAROM;Right;10 reps;Supine (2 sets; strengthening L)    General Comments        Pertinent Vitals/Pain Pain Assessment: 0-10 Pain Score: 7  Pain Location: R hip to knee (Also R shoulder from previous sh replacement surgery) Pain Descriptors / Indicators: Constant;Aching;Sharp;Shooting    Home Living                      Prior Function            PT Goals (current goals can now be found in the care plan section)      Frequency    BID      PT Plan Current plan remains appropriate    Co-evaluation              AM-PAC PT "  6 Clicks" Daily Activity  Outcome Measure  Difficulty turning over in bed (including adjusting bedclothes, sheets and blankets)?: Total Difficulty moving from lying on back to sitting on the side of the bed? : Total Difficulty sitting down on and standing up from a chair with arms (e.g., wheelchair, bedside commode, etc,.)?: Total Help needed moving to and from a bed to chair (including a wheelchair)?: A Lot Help needed walking in hospital room?: A Lot Help needed climbing 3-5 steps with a railing? : A Lot 6 Click Score: 9    End of Session   Activity Tolerance: Patient limited by lethargy;Patient limited by fatigue;Patient limited by pain Patient left: in bed;with bed alarm set;with call bell/phone within reach   PT Visit Diagnosis: Muscle weakness (generalized) (M62.81);History of falling (Z91.81);Difficulty in walking, not elsewhere classified (R26.2);Pain Pain - Right/Left: Right Pain - part of body: Hip     Time: [id number] PT Time  Calculation (min) (ACUTE ONLY): 31 min  Charges:  $Therapeutic Exercise: 23-37 mins                    G Codes:         Scot DockHeidi E Barnes, PTA 01/19/2017, 10:30 AM

## 2017-01-22 LAB — HIV ANTIBODY (ROUTINE TESTING W REFLEX): HIV Screen 4th Generation wRfx: NONREACTIVE

## 2017-02-03 ENCOUNTER — Other Ambulatory Visit: Payer: Self-pay | Admitting: Surgery

## 2017-02-03 DIAGNOSIS — M7989 Other specified soft tissue disorders: Principal | ICD-10-CM

## 2017-02-03 DIAGNOSIS — M79604 Pain in right leg: Secondary | ICD-10-CM

## 2017-02-04 ENCOUNTER — Ambulatory Visit
Admission: RE | Admit: 2017-02-04 | Discharge: 2017-02-04 | Disposition: A | Discharge status: Home / Self Care | Payer: Medicaid Other | Source: Ambulatory Visit | Attending: Surgery | Admitting: Surgery

## 2017-02-04 DIAGNOSIS — M79604 Pain in right leg: Secondary | ICD-10-CM

## 2017-02-04 DIAGNOSIS — M7989 Other specified soft tissue disorders: Secondary | ICD-10-CM | POA: Diagnosis not present

## 2017-03-25 DIAGNOSIS — M5430 Sciatica, unspecified side: Secondary | ICD-10-CM | POA: Insufficient documentation

## 2017-07-29 ENCOUNTER — Ambulatory Visit
Admission: RE | Admit: 2017-07-29 | Discharge: 2017-07-29 | Disposition: A | Discharge status: Home / Self Care | Payer: Medicaid Other | Source: Ambulatory Visit | Attending: Family Medicine | Admitting: Family Medicine

## 2017-07-29 ENCOUNTER — Other Ambulatory Visit: Payer: Self-pay | Admitting: Family Medicine

## 2017-07-29 DIAGNOSIS — R918 Other nonspecific abnormal finding of lung field: Secondary | ICD-10-CM | POA: Diagnosis not present

## 2017-07-29 DIAGNOSIS — R05 Cough: Secondary | ICD-10-CM

## 2017-07-29 DIAGNOSIS — R059 Cough, unspecified: Secondary | ICD-10-CM

## 2017-08-12 ENCOUNTER — Telehealth: Payer: Self-pay

## 2017-08-12 NOTE — Telephone Encounter (Signed)
Pt pharmacy sent a refill request for flomax. Please advise.

## 2017-08-14 NOTE — Telephone Encounter (Signed)
Can refill for 30 days.  Recommend follow-up visit.

## 2017-08-17 MED ORDER — TAMSULOSIN HCL 0.4 MG PO CAPS: 0.4000 mg | ORAL_CAPSULE | Freq: Every day | ORAL | 0 refills | Status: DC

## 2017-08-17 NOTE — Telephone Encounter (Signed)
Spoke with pt who denied making a f/u appt. . Encouraged pt to call back to set up appt. 30 tabs and no refills sent to pharmacy.

## 2017-08-29 IMAGING — CT CT HEAD W/O CM
4 series · 16 of 47 positions shown, 18 images · non-contrast
Comparison: 04/22/2015

CLINICAL DATA: Pt reports fell today. Pain in right hip. Right leg
is shortened but not rotated. Pt has hx of left hip replacement. Pts
wife gave 10mg percocet prior to EMS arrival. Pt alert and oriented
x 4 . PT reports having hit head but denies head pain at this time.

EXAM:
CT HEAD WITHOUT CONTRAST
TECHNIQUE: Contiguous axial images were obtained from the base of the skull
through the vertex without intravenous contrast.

[Series 2: head wo · axial · 0.45mm/px · z∈[-135,-25]mm · 6 of 32 slices shown, 8 images]
[im 5/32  brain]
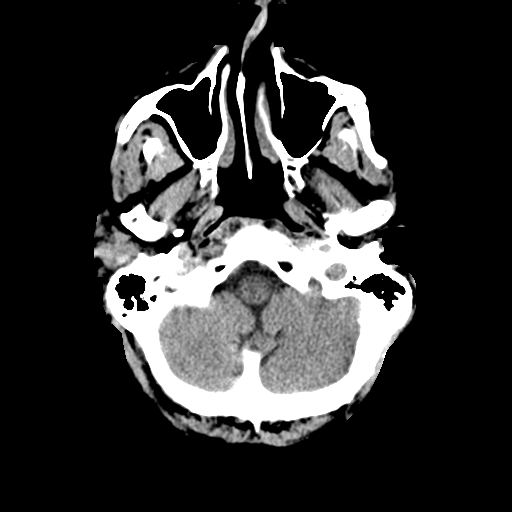
[im 5/32  bone]
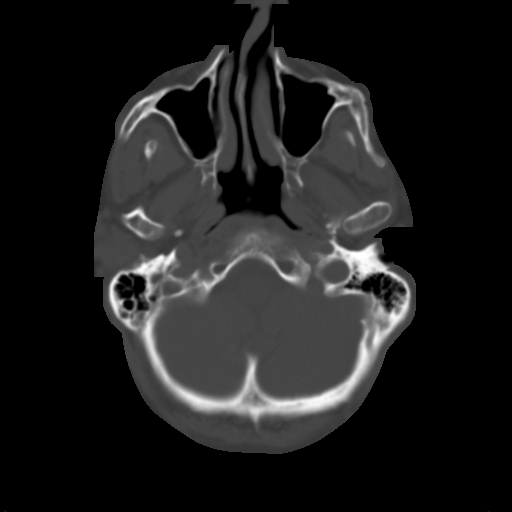
[im 9/32  brain]
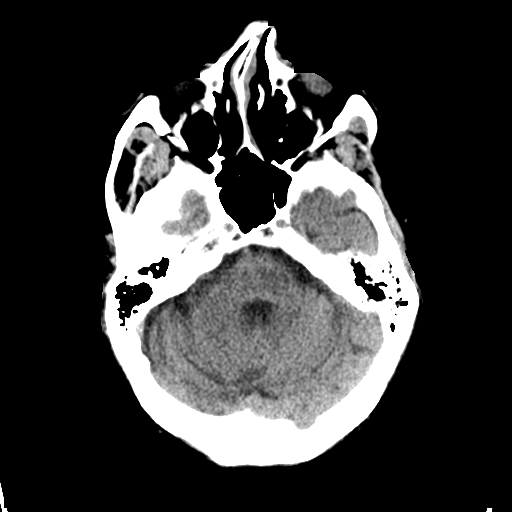
[im 14/32  brain]
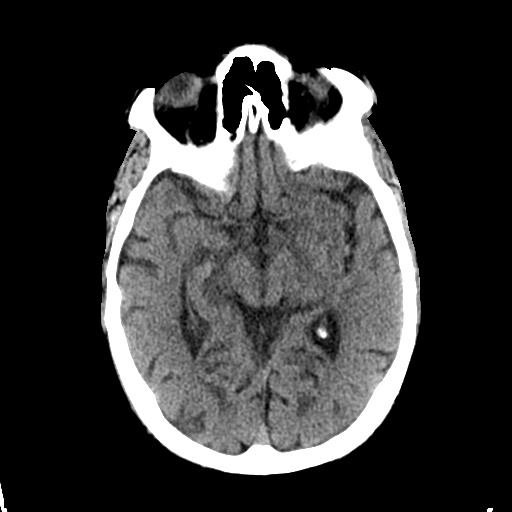
[im 18/32  brain]
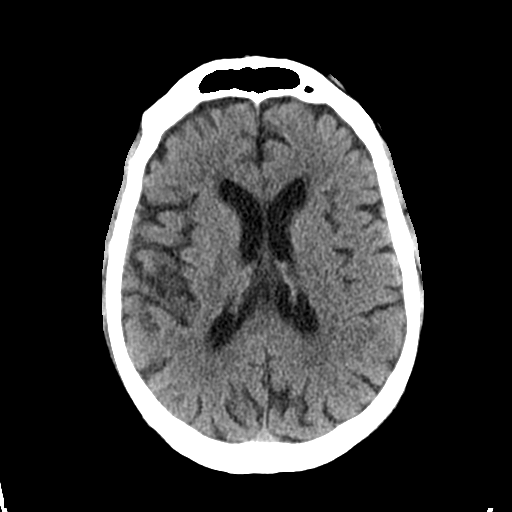
[im 23/32  brain]
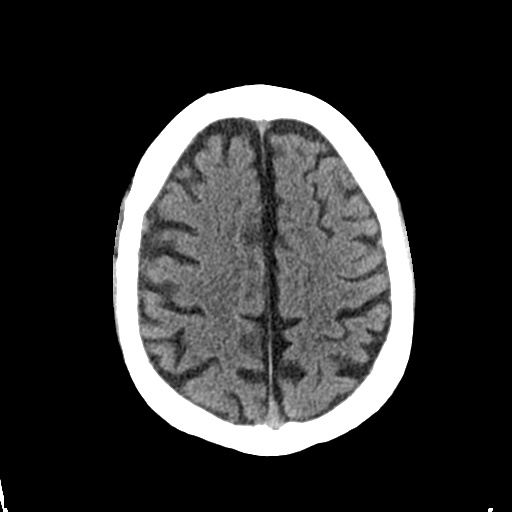
[im 23/32  bone]
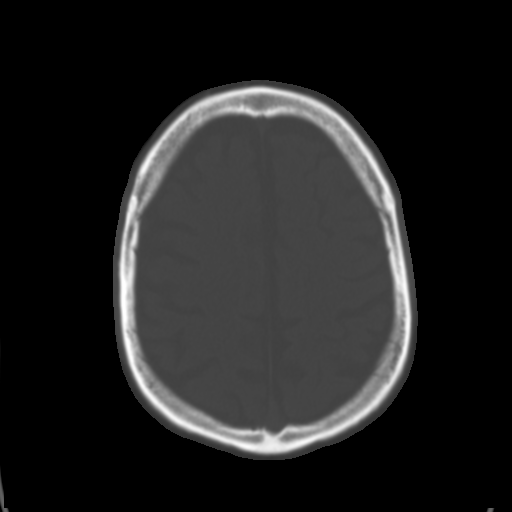
[im 27/32  brain]
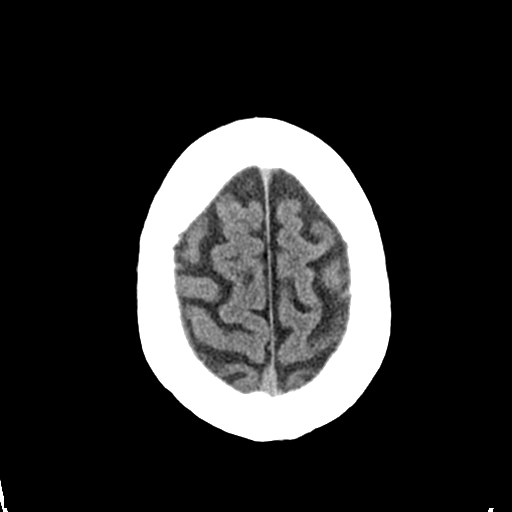

[Series 3: head bone · axial · 0.45mm/px · z∈[-141,-87]mm · 4 of 82 slices shown]
[im 8/82  bone]
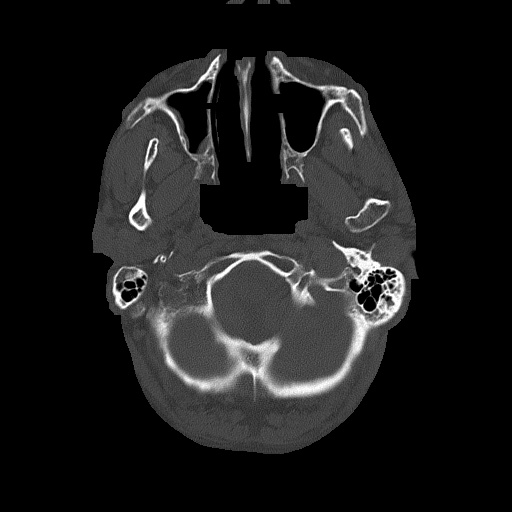
[im 16/82  bone]
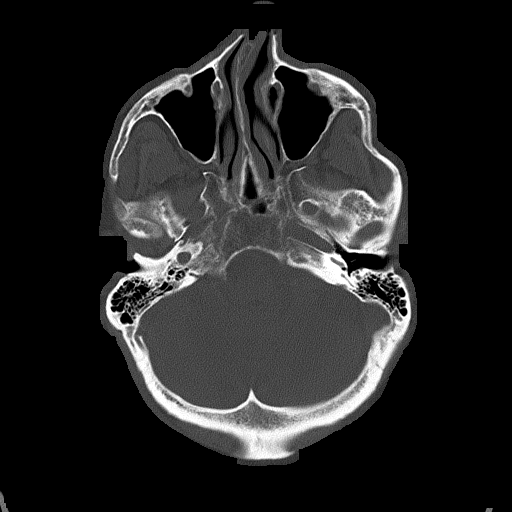
[im 28/82  bone]
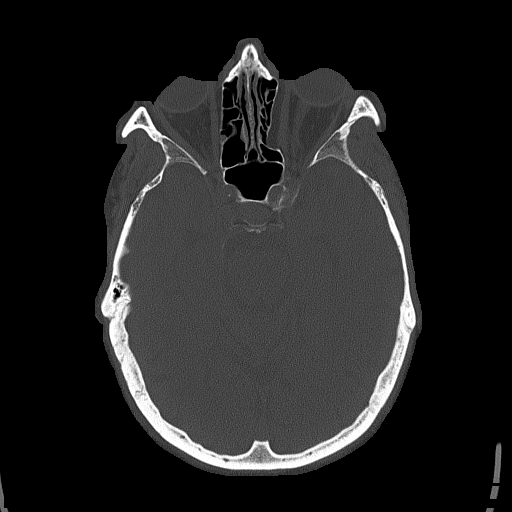
[im 35/82  bone]
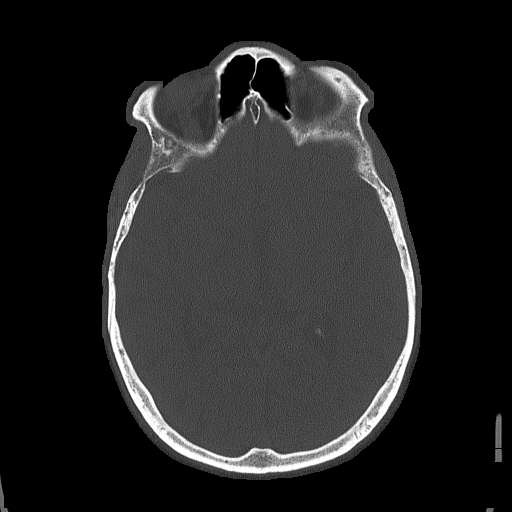

[Series 4: coronal soft tissue · coronal · 0.33mm/px · 3 of 67 slices shown]
[im 23/67  brain]
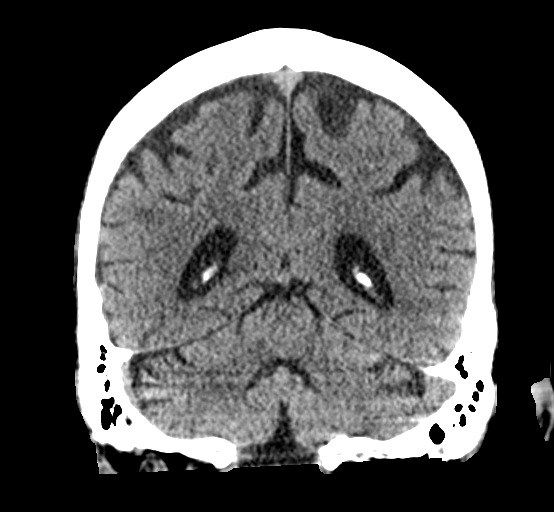
[im 30/67  brain]
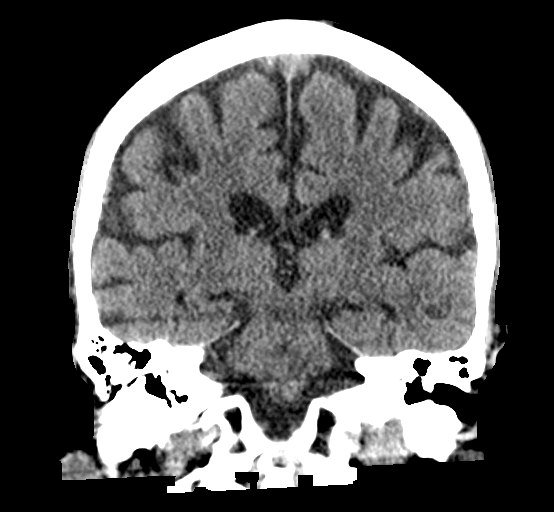
[im 37/67  brain]
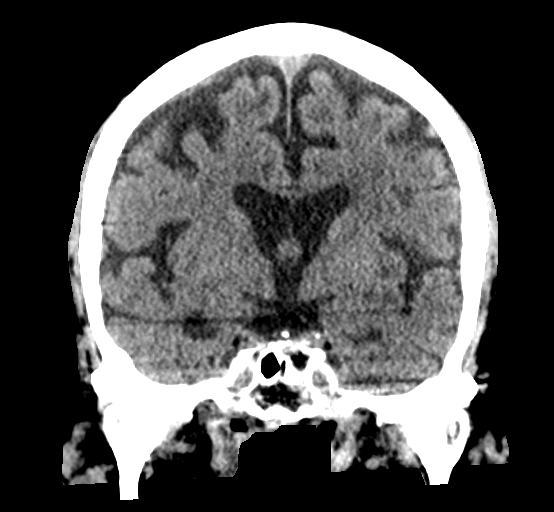

[Series 5: sagittal soft tissue · sagittal · 0.36mm/px · 3 of 52 slices shown]
[im 18/52  brain]
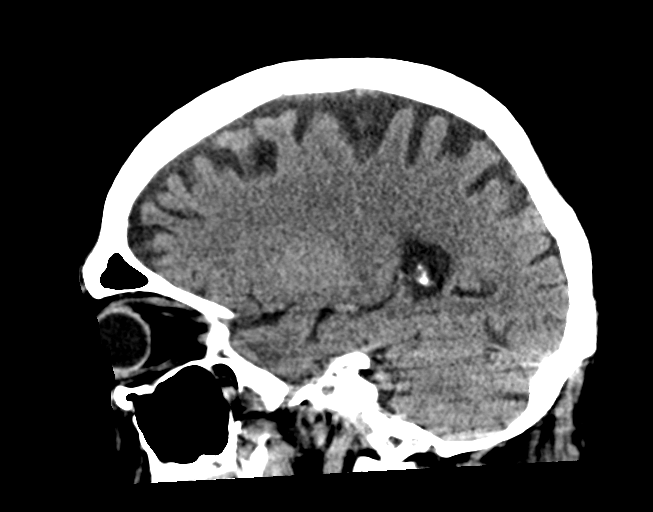
[im 26/52  brain]
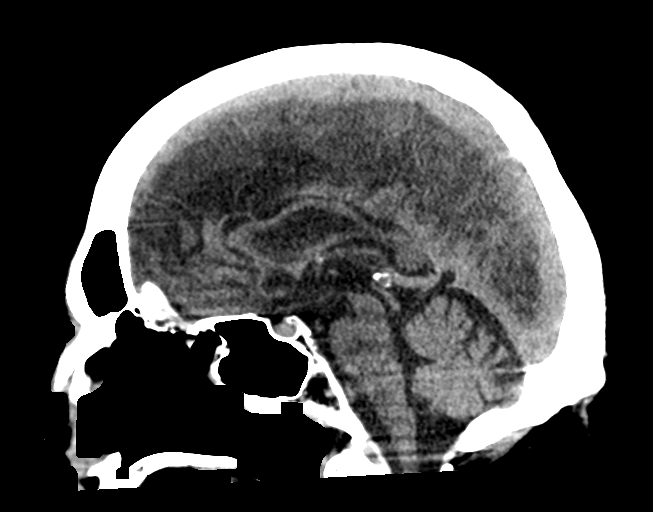
[im 35/52  brain]
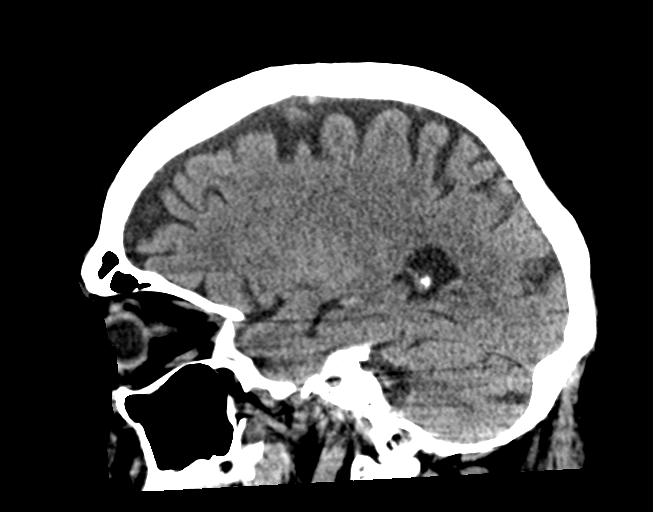

[16 of 47 positions shown; findings below may reference images not displayed]

FINDINGS: Brain: No evidence of acute infarction, hemorrhage, hydrocephalus,
extra-axial collection or mass lesion/mass effect. The ventricles
and sulci are mildly enlarged reflecting mild generalized atrophy.

Vascular: No hyperdense vessel or unexpected calcification.

Skull: Normal. Negative for fracture or focal lesion.

Sinuses/Orbits: Globes and orbits are unremarkable. Visualized
sinuses and mastoid air cells are clear.

Other: None.
IMPRESSION: 1. No acute intracranial abnormalities.
2. Mild generalized atrophy, stable from the prior exam.

## 2017-10-01 ENCOUNTER — Ambulatory Visit: Payer: Medicaid Other | Attending: Neurology

## 2017-10-01 DIAGNOSIS — F5101 Primary insomnia: Secondary | ICD-10-CM | POA: Diagnosis present

## 2017-10-01 DIAGNOSIS — G4733 Obstructive sleep apnea (adult) (pediatric): Secondary | ICD-10-CM | POA: Diagnosis not present

## 2017-10-01 DIAGNOSIS — J449 Chronic obstructive pulmonary disease, unspecified: Secondary | ICD-10-CM | POA: Diagnosis not present

## 2017-10-01 DIAGNOSIS — R0683 Snoring: Secondary | ICD-10-CM | POA: Insufficient documentation

## 2017-10-01 DIAGNOSIS — Z9981 Dependence on supplemental oxygen: Secondary | ICD-10-CM | POA: Insufficient documentation

## 2017-10-01 DIAGNOSIS — R351 Nocturia: Secondary | ICD-10-CM | POA: Insufficient documentation

## 2017-10-21 ENCOUNTER — Ambulatory Visit: Payer: Medicaid Other | Attending: Specialist

## 2017-10-29 ENCOUNTER — Ambulatory Visit: Payer: Medicaid Other | Attending: Neurology

## 2017-10-29 DIAGNOSIS — G4733 Obstructive sleep apnea (adult) (pediatric): Secondary | ICD-10-CM | POA: Diagnosis not present

## 2017-10-29 DIAGNOSIS — F5101 Primary insomnia: Secondary | ICD-10-CM | POA: Diagnosis not present

## 2017-10-29 DIAGNOSIS — R0681 Apnea, not elsewhere classified: Secondary | ICD-10-CM | POA: Diagnosis present

## 2017-11-05 ENCOUNTER — Other Ambulatory Visit: Payer: Self-pay

## 2017-11-05 ENCOUNTER — Inpatient Hospital Stay
Admission: EM | Admit: 2017-11-05 | Discharge: 2017-11-11 | DRG: 690 | Disposition: A | Discharge status: Home Health | Payer: Medicaid Other | Attending: Internal Medicine | Admitting: Internal Medicine

## 2017-11-05 ENCOUNTER — Emergency Department: Payer: Medicaid Other

## 2017-11-05 ENCOUNTER — Inpatient Hospital Stay: Payer: Medicaid Other

## 2017-11-05 DIAGNOSIS — F319 Bipolar disorder, unspecified: Secondary | ICD-10-CM | POA: Diagnosis present

## 2017-11-05 DIAGNOSIS — N390 Urinary tract infection, site not specified: Secondary | ICD-10-CM | POA: Diagnosis not present

## 2017-11-05 DIAGNOSIS — F17210 Nicotine dependence, cigarettes, uncomplicated: Secondary | ICD-10-CM | POA: Diagnosis present

## 2017-11-05 DIAGNOSIS — H5461 Unqualified visual loss, right eye, normal vision left eye: Secondary | ICD-10-CM | POA: Diagnosis present

## 2017-11-05 DIAGNOSIS — I739 Peripheral vascular disease, unspecified: Secondary | ICD-10-CM | POA: Diagnosis present

## 2017-11-05 DIAGNOSIS — G8929 Other chronic pain: Secondary | ICD-10-CM | POA: Diagnosis present

## 2017-11-05 DIAGNOSIS — R600 Localized edema: Secondary | ICD-10-CM | POA: Diagnosis not present

## 2017-11-05 DIAGNOSIS — Z6820 Body mass index (BMI) 20.0-20.9, adult: Secondary | ICD-10-CM

## 2017-11-05 DIAGNOSIS — T40605A Adverse effect of unspecified narcotics, initial encounter: Secondary | ICD-10-CM | POA: Diagnosis present

## 2017-11-05 DIAGNOSIS — K7460 Unspecified cirrhosis of liver: Secondary | ICD-10-CM | POA: Diagnosis present

## 2017-11-05 DIAGNOSIS — M549 Dorsalgia, unspecified: Secondary | ICD-10-CM

## 2017-11-05 DIAGNOSIS — M109 Gout, unspecified: Secondary | ICD-10-CM | POA: Diagnosis present

## 2017-11-05 DIAGNOSIS — L03115 Cellulitis of right lower limb: Secondary | ICD-10-CM | POA: Diagnosis present

## 2017-11-05 DIAGNOSIS — E440 Moderate protein-calorie malnutrition: Secondary | ICD-10-CM | POA: Diagnosis present

## 2017-11-05 DIAGNOSIS — F341 Dysthymic disorder: Secondary | ICD-10-CM

## 2017-11-05 DIAGNOSIS — N3001 Acute cystitis with hematuria: Principal | ICD-10-CM | POA: Diagnosis present

## 2017-11-05 DIAGNOSIS — M5136 Other intervertebral disc degeneration, lumbar region: Secondary | ICD-10-CM | POA: Diagnosis present

## 2017-11-05 DIAGNOSIS — Z8249 Family history of ischemic heart disease and other diseases of the circulatory system: Secondary | ICD-10-CM

## 2017-11-05 DIAGNOSIS — M79605 Pain in left leg: Secondary | ICD-10-CM | POA: Diagnosis not present

## 2017-11-05 DIAGNOSIS — Z888 Allergy status to other drugs, medicaments and biological substances status: Secondary | ICD-10-CM

## 2017-11-05 DIAGNOSIS — B182 Chronic viral hepatitis C: Secondary | ICD-10-CM | POA: Diagnosis present

## 2017-11-05 DIAGNOSIS — Z7951 Long term (current) use of inhaled steroids: Secondary | ICD-10-CM

## 2017-11-05 DIAGNOSIS — H409 Unspecified glaucoma: Secondary | ICD-10-CM | POA: Diagnosis present

## 2017-11-05 DIAGNOSIS — R627 Adult failure to thrive: Secondary | ICD-10-CM | POA: Diagnosis present

## 2017-11-05 DIAGNOSIS — F1010 Alcohol abuse, uncomplicated: Secondary | ICD-10-CM | POA: Diagnosis present

## 2017-11-05 DIAGNOSIS — I70212 Atherosclerosis of native arteries of extremities with intermittent claudication, left leg: Secondary | ICD-10-CM | POA: Diagnosis not present

## 2017-11-05 DIAGNOSIS — Z833 Family history of diabetes mellitus: Secondary | ICD-10-CM

## 2017-11-05 DIAGNOSIS — M545 Low back pain: Secondary | ICD-10-CM | POA: Diagnosis present

## 2017-11-05 DIAGNOSIS — R739 Hyperglycemia, unspecified: Secondary | ICD-10-CM | POA: Diagnosis present

## 2017-11-05 DIAGNOSIS — L03116 Cellulitis of left lower limb: Secondary | ICD-10-CM | POA: Diagnosis present

## 2017-11-05 DIAGNOSIS — R531 Weakness: Secondary | ICD-10-CM

## 2017-11-05 DIAGNOSIS — K219 Gastro-esophageal reflux disease without esophagitis: Secondary | ICD-10-CM | POA: Diagnosis present

## 2017-11-05 DIAGNOSIS — Z803 Family history of malignant neoplasm of breast: Secondary | ICD-10-CM | POA: Diagnosis not present

## 2017-11-05 DIAGNOSIS — K5903 Drug induced constipation: Secondary | ICD-10-CM | POA: Diagnosis present

## 2017-11-05 DIAGNOSIS — L8990 Pressure ulcer of unspecified site, unspecified stage: Secondary | ICD-10-CM

## 2017-11-05 DIAGNOSIS — R569 Unspecified convulsions: Secondary | ICD-10-CM | POA: Diagnosis present

## 2017-11-05 DIAGNOSIS — Z96612 Presence of left artificial shoulder joint: Secondary | ICD-10-CM | POA: Diagnosis present

## 2017-11-05 DIAGNOSIS — J449 Chronic obstructive pulmonary disease, unspecified: Secondary | ICD-10-CM | POA: Diagnosis present

## 2017-11-05 DIAGNOSIS — M1990 Unspecified osteoarthritis, unspecified site: Secondary | ICD-10-CM | POA: Diagnosis present

## 2017-11-05 DIAGNOSIS — Z79891 Long term (current) use of opiate analgesic: Secondary | ICD-10-CM

## 2017-11-05 DIAGNOSIS — D539 Nutritional anemia, unspecified: Secondary | ICD-10-CM | POA: Diagnosis present

## 2017-11-05 DIAGNOSIS — Z590 Homelessness: Secondary | ICD-10-CM

## 2017-11-05 LAB — HEMOGLOBIN A1C
Hgb A1c MFr Bld: 4.4 % — ABNORMAL LOW (ref 4.8–5.6)
Mean Plasma Glucose: 79.58 mg/dL

## 2017-11-05 LAB — URINALYSIS, COMPLETE (UACMP) WITH MICROSCOPIC
Bilirubin Urine: NEGATIVE
GLUCOSE, UA: NEGATIVE mg/dL
Ketones, ur: 5 mg/dL — AB
NITRITE: POSITIVE — AB
PROTEIN: 30 mg/dL — AB
Specific Gravity, Urine: 1.017 (ref 1.005–1.030)
pH: 6 (ref 5.0–8.0)

## 2017-11-05 LAB — COMPREHENSIVE METABOLIC PANEL
ALT: 19 U/L (ref 17–63)
ANION GAP: 5 (ref 5–15)
AST: 35 U/L (ref 15–41)
Albumin: 2.1 g/dL — ABNORMAL LOW (ref 3.5–5.0)
Alkaline Phosphatase: 145 U/L — ABNORMAL HIGH (ref 38–126)
BUN: 7 mg/dL (ref 6–20)
CHLORIDE: 105 mmol/L (ref 101–111)
CO2: 28 mmol/L (ref 22–32)
CREATININE: 0.62 mg/dL (ref 0.61–1.24)
Calcium: 7.8 mg/dL — ABNORMAL LOW (ref 8.9–10.3)
Glucose, Bld: 113 mg/dL — ABNORMAL HIGH (ref 65–99)
POTASSIUM: 4.1 mmol/L (ref 3.5–5.1)
Sodium: 138 mmol/L (ref 135–145)
Total Bilirubin: 1.1 mg/dL (ref 0.3–1.2)
Total Protein: 5.2 g/dL — ABNORMAL LOW (ref 6.5–8.1)

## 2017-11-05 LAB — CBC WITH DIFFERENTIAL/PLATELET
Basophils Absolute: 0 10*3/uL (ref 0–0.1)
Basophils Relative: 1 %
Eosinophils Absolute: 0.2 10*3/uL (ref 0–0.7)
Eosinophils Relative: 2 %
HCT: 36.9 % — ABNORMAL LOW (ref 40.0–52.0)
HEMOGLOBIN: 12.6 g/dL — AB (ref 13.0–18.0)
LYMPHS ABS: 1 10*3/uL (ref 1.0–3.6)
Lymphocytes Relative: 12 %
MCH: 35.2 pg — AB (ref 26.0–34.0)
MCHC: 34.2 g/dL (ref 32.0–36.0)
MCV: 102.9 fL — ABNORMAL HIGH (ref 80.0–100.0)
Monocytes Absolute: 1.1 10*3/uL — ABNORMAL HIGH (ref 0.2–1.0)
Monocytes Relative: 14 %
NEUTROS PCT: 71 %
Neutro Abs: 5.9 10*3/uL (ref 1.4–6.5)
Platelets: 326 10*3/uL (ref 150–440)
RBC: 3.59 MIL/uL — AB (ref 4.40–5.90)
RDW: 15.4 % — ABNORMAL HIGH (ref 11.5–14.5)
WBC: 8.2 10*3/uL (ref 3.8–10.6)

## 2017-11-05 LAB — VITAMIN B12: VITAMIN B 12: 942 pg/mL — AB (ref 180–914)

## 2017-11-05 LAB — PREALBUMIN: PREALBUMIN: 12.8 mg/dL — AB (ref 18–38)

## 2017-11-05 LAB — URINE DRUG SCREEN, QUALITATIVE (ARMC ONLY)
Amphetamines, Ur Screen: NOT DETECTED
Barbiturates, Ur Screen: NOT DETECTED
Benzodiazepine, Ur Scrn: NOT DETECTED
CANNABINOID 50 NG, UR ~~LOC~~: NOT DETECTED
Cocaine Metabolite,Ur ~~LOC~~: NOT DETECTED
MDMA (ECSTASY) UR SCREEN: NOT DETECTED
Methadone Scn, Ur: NOT DETECTED
OPIATE, UR SCREEN: POSITIVE — AB
PHENCYCLIDINE (PCP) UR S: NOT DETECTED
Tricyclic, Ur Screen: NOT DETECTED

## 2017-11-05 LAB — ETHANOL

## 2017-11-05 LAB — AMMONIA: Ammonia: 15 umol/L (ref 9–35)

## 2017-11-05 LAB — MAGNESIUM: Magnesium: 1.5 mg/dL — ABNORMAL LOW (ref 1.7–2.4)

## 2017-11-05 LAB — CK: Total CK: 86 U/L (ref 49–397)

## 2017-11-05 LAB — TROPONIN I: Troponin I: <0.03 ng/mL (ref <0.03)

## 2017-11-05 LAB — BETA-HYDROXYBUTYRIC ACID: BETA-HYDROXYBUTYRIC ACID: 0.45 mmol/L — AB (ref 0.05–0.27)

## 2017-11-05 LAB — PROTIME-INR
INR: 1
Prothrombin Time: 13.1 seconds (ref 11.4–15.2)

## 2017-11-05 LAB — PHOSPHORUS: Phosphorus: 2.6 mg/dL (ref 2.5–4.6)

## 2017-11-05 LAB — LIPASE, BLOOD: LIPASE: 21 U/L (ref 11–51)

## 2017-11-05 LAB — BRAIN NATRIURETIC PEPTIDE: B Natriuretic Peptide: 162 pg/mL — ABNORMAL HIGH (ref 0.0–100.0)

## 2017-11-05 LAB — FOLATE: Folate: 10.7 ng/mL (ref >5.9)

## 2017-11-05 MED ORDER — OCUVITE-LUTEIN PO CAPS
1.0000 | ORAL_CAPSULE | Freq: Every day | ORAL | Status: DC
  Administered 2017-11-05 – 2017-11-11 (×7): 1 via ORAL
  Filled 2017-11-05 (×7): qty 1

## 2017-11-05 MED ORDER — LORAZEPAM 2 MG/ML IJ SOLN: 1.0000 mg | Freq: Four times a day (QID) | INTRAMUSCULAR | Status: DC | PRN

## 2017-11-05 MED ORDER — TRAZODONE HCL 50 MG PO TABS: 150.0000 mg | ORAL_TABLET | Freq: Every day | ORAL | Status: DC

## 2017-11-05 MED ORDER — BISACODYL 5 MG PO TBEC
5.0000 mg | DELAYED_RELEASE_TABLET | Freq: Every day | ORAL | Status: DC | PRN
  Administered 2017-11-08: 5 mg via ORAL
  Filled 2017-11-05: qty 1

## 2017-11-05 MED ORDER — FOLIC ACID 1 MG PO TABS
1.0000 mg | ORAL_TABLET | Freq: Every day | ORAL | Status: DC
  Administered 2017-11-05 – 2017-11-11 (×7): 1 mg via ORAL
  Filled 2017-11-05 (×7): qty 1

## 2017-11-05 MED ORDER — SODIUM CHLORIDE 0.9 % IV BOLUS
500.0000 mL | Freq: Once | INTRAVENOUS | Status: AC
  Administered 2017-11-05: 500 mL via INTRAVENOUS

## 2017-11-05 MED ORDER — MAGNESIUM SULFATE 2 GM/50ML IV SOLN
2.0000 g | Freq: Once | INTRAVENOUS | Status: AC
  Administered 2017-11-05: 2 g via INTRAVENOUS
  Filled 2017-11-05: qty 50

## 2017-11-05 MED ORDER — ENSURE ENLIVE PO LIQD
237.0000 mL | Freq: Three times a day (TID) | ORAL | Status: DC
  Administered 2017-11-05 – 2017-11-11 (×16): 237 mL via ORAL

## 2017-11-05 MED ORDER — MORPHINE SULFATE ER 15 MG PO TBCR
15.0000 mg | EXTENDED_RELEASE_TABLET | Freq: Two times a day (BID) | ORAL | Status: DC
  Administered 2017-11-06 – 2017-11-11 (×11): 15 mg via ORAL
  Filled 2017-11-05 (×11): qty 1

## 2017-11-05 MED ORDER — FLUTICASONE FUROATE-VILANTEROL 200-25 MCG/INH IN AEPB
1.0000 | INHALATION_SPRAY | Freq: Every day | RESPIRATORY_TRACT | Status: DC
  Administered 2017-11-05 – 2017-11-11 (×7): 1 via RESPIRATORY_TRACT
  Filled 2017-11-05: qty 28

## 2017-11-05 MED ORDER — ADULT MULTIVITAMIN W/MINERALS CH
1.0000 | ORAL_TABLET | Freq: Every day | ORAL | Status: DC
  Administered 2017-11-05 – 2017-11-11 (×7): 1 via ORAL
  Filled 2017-11-05 (×7): qty 1

## 2017-11-05 MED ORDER — SUCRALFATE 1 G PO TABS
1.0000 g | ORAL_TABLET | Freq: Three times a day (TID) | ORAL | Status: DC
  Administered 2017-11-05 – 2017-11-11 (×27): 1 g via ORAL
  Filled 2017-11-05 (×27): qty 1

## 2017-11-05 MED ORDER — VITAMIN B-1 100 MG PO TABS
100.0000 mg | ORAL_TABLET | Freq: Every day | ORAL | Status: DC
  Administered 2017-11-05 – 2017-11-11 (×7): 100 mg via ORAL
  Filled 2017-11-05 (×7): qty 1

## 2017-11-05 MED ORDER — QUETIAPINE FUMARATE 300 MG PO TABS
300.0000 mg | ORAL_TABLET | Freq: Every day | ORAL | Status: DC
  Administered 2017-11-05 – 2017-11-10 (×6): 300 mg via ORAL
  Filled 2017-11-05 (×7): qty 1

## 2017-11-05 MED ORDER — PANTOPRAZOLE SODIUM 40 MG PO TBEC
40.0000 mg | DELAYED_RELEASE_TABLET | Freq: Every day | ORAL | Status: DC
  Administered 2017-11-05 – 2017-11-11 (×7): 40 mg via ORAL
  Filled 2017-11-05 (×7): qty 1

## 2017-11-05 MED ORDER — TRAZODONE HCL 100 MG PO TABS
200.0000 mg | ORAL_TABLET | Freq: Every day | ORAL | Status: DC
  Administered 2017-11-05 – 2017-11-10 (×6): 200 mg via ORAL
  Filled 2017-11-05 (×6): qty 2

## 2017-11-05 MED ORDER — ENOXAPARIN SODIUM 40 MG/0.4ML ~~LOC~~ SOLN
40.0000 mg | SUBCUTANEOUS | Status: DC
  Administered 2017-11-05 – 2017-11-11 (×7): 40 mg via SUBCUTANEOUS
  Filled 2017-11-05 (×7): qty 0.4

## 2017-11-05 MED ORDER — POLYVINYL ALCOHOL 1.4 % OP SOLN
1.0000 [drp] | Freq: Four times a day (QID) | OPHTHALMIC | Status: DC
  Administered 2017-11-05 – 2017-11-11 (×23): 1 [drp] via OPHTHALMIC
  Filled 2017-11-05: qty 15

## 2017-11-05 MED ORDER — LORAZEPAM 1 MG PO TABS: 1.0000 mg | ORAL_TABLET | Freq: Four times a day (QID) | ORAL | Status: DC | PRN

## 2017-11-05 MED ORDER — ZOLPIDEM TARTRATE 10 MG PO TABS: 10.0000 mg | ORAL_TABLET | Freq: Every evening | ORAL | Status: DC | PRN

## 2017-11-05 MED ORDER — TIOTROPIUM BROMIDE MONOHYDRATE 18 MCG IN CAPS
18.0000 ug | ORAL_CAPSULE | Freq: Every day | RESPIRATORY_TRACT | Status: DC
  Administered 2017-11-05 – 2017-11-11 (×7): 18 ug via RESPIRATORY_TRACT
  Filled 2017-11-05 (×2): qty 5

## 2017-11-05 MED ORDER — CARBOXYMETHYLCELLULOSE SODIUM 0.25 % OP SOLN: 2.0000 [drp] | Freq: Four times a day (QID) | OPHTHALMIC | Status: DC

## 2017-11-05 MED ORDER — OXYCODONE HCL 5 MG PO TABS
5.0000 mg | ORAL_TABLET | ORAL | Status: DC | PRN
  Administered 2017-11-05 (×2): 5 mg via ORAL
  Administered 2017-11-06 – 2017-11-07 (×5): 10 mg via ORAL
  Administered 2017-11-07: 5 mg via ORAL
  Administered 2017-11-07 – 2017-11-11 (×14): 10 mg via ORAL
  Filled 2017-11-05 (×3): qty 2
  Filled 2017-11-05: qty 1
  Filled 2017-11-05 (×5): qty 2
  Filled 2017-11-05: qty 1
  Filled 2017-11-05 (×8): qty 2
  Filled 2017-11-05: qty 1
  Filled 2017-11-05 (×3): qty 2

## 2017-11-05 MED ORDER — MORPHINE SULFATE ER 15 MG PO TBCR
30.0000 mg | EXTENDED_RELEASE_TABLET | Freq: Every day | ORAL | Status: DC
  Administered 2017-11-05: 30 mg via ORAL
  Filled 2017-11-05: qty 2

## 2017-11-05 MED ORDER — TAMSULOSIN HCL 0.4 MG PO CAPS
0.4000 mg | ORAL_CAPSULE | Freq: Every day | ORAL | Status: DC
  Administered 2017-11-05 – 2017-11-11 (×7): 0.4 mg via ORAL
  Filled 2017-11-05 (×7): qty 1

## 2017-11-05 MED ORDER — ALBUTEROL SULFATE (2.5 MG/3ML) 0.083% IN NEBU
2.5000 mg | INHALATION_SOLUTION | Freq: Four times a day (QID) | RESPIRATORY_TRACT | Status: DC | PRN
  Administered 2017-11-09 – 2017-11-11 (×3): 2.5 mg via RESPIRATORY_TRACT
  Filled 2017-11-05 (×3): qty 3

## 2017-11-05 MED ORDER — SENNOSIDES-DOCUSATE SODIUM 8.6-50 MG PO TABS: 1.0000 | ORAL_TABLET | Freq: Every evening | ORAL | Status: DC | PRN

## 2017-11-05 MED ORDER — GABAPENTIN 300 MG PO CAPS
900.0000 mg | ORAL_CAPSULE | Freq: Three times a day (TID) | ORAL | Status: DC
  Administered 2017-11-05 – 2017-11-11 (×19): 900 mg via ORAL
  Filled 2017-11-05 (×19): qty 3

## 2017-11-05 MED ORDER — CEFTRIAXONE SODIUM 1 G IJ SOLR
1.0000 g | Freq: Once | INTRAMUSCULAR | Status: AC
  Administered 2017-11-05: 1 g via INTRAVENOUS
  Filled 2017-11-05: qty 10

## 2017-11-05 MED ORDER — SODIUM CHLORIDE 0.9 % IV SOLN
1.0000 g | Freq: Every day | INTRAVENOUS | Status: DC
  Administered 2017-11-06 – 2017-11-07 (×2): 1 g via INTRAVENOUS
  Filled 2017-11-05 (×2): qty 10

## 2017-11-05 MED ORDER — LATANOPROST 0.005 % OP SOLN
1.0000 [drp] | Freq: Every day | OPHTHALMIC | Status: DC
  Administered 2017-11-05 – 2017-11-10 (×6): 1 [drp] via OPHTHALMIC
  Filled 2017-11-05: qty 2.5

## 2017-11-05 MED ORDER — MIRTAZAPINE 15 MG PO TABS
15.0000 mg | ORAL_TABLET | Freq: Every day | ORAL | Status: DC
  Administered 2017-11-05 – 2017-11-10 (×6): 15 mg via ORAL
  Filled 2017-11-05 (×7): qty 1

## 2017-11-05 MED ORDER — ONDANSETRON HCL 4 MG PO TABS
4.0000 mg | ORAL_TABLET | Freq: Four times a day (QID) | ORAL | Status: DC | PRN
  Administered 2017-11-05 – 2017-11-10 (×7): 4 mg via ORAL
  Filled 2017-11-05 (×7): qty 1

## 2017-11-05 MED ORDER — MOMETASONE FURO-FORMOTEROL FUM 200-5 MCG/ACT IN AERO: 2.0000 | INHALATION_SPRAY | Freq: Two times a day (BID) | RESPIRATORY_TRACT | Status: DC

## 2017-11-05 MED ORDER — ONDANSETRON HCL 4 MG/2ML IJ SOLN: 4.0000 mg | Freq: Four times a day (QID) | INTRAMUSCULAR | Status: DC | PRN

## 2017-11-05 NOTE — Progress Notes (Addendum)
Initial Nutrition Assessment  DOCUMENTATION CODES:   Not applicable  INTERVENTION:   Pt likely at high refeeding risk; recommend monitor K, Mg, and P labs.    Ensure Enlive po BID, each supplement provides 350 kcal and 20 grams of protein  Magic cup TID with meals, each supplement provides 290 kcal and 9 grams of protein  Ocuvite daily for wound healing (provides zinc, vitamin A, vitamin C, Vitamin E, copper, and selenium)  Dysphagia 3 diet   Daily MVI  Folic acid 74m po daily  Recommend Thiamine 1072mIV daily x 3 days, then 10082mo daily   NUTRITION DIAGNOSIS:   Increased nutrient needs related to wound healing, other (see comment)(COPD, etoh abuse ) as evidenced by increased estimated needs.  GOAL:   Patient will meet greater than or equal to 90% of their needs  MONITOR:   PO intake, Supplement acceptance, Labs, Weight trends, Skin, I & O's  REASON FOR ASSESSMENT:   Consult Assessment of nutrition requirement/status  ASSESSMENT:   [age]o. male with a known history of COPD/asthma, GERD, chronic HCV, EtOH abuse, PVD (per prior documentation), glaucoma, DJD/OA/CBP, anx/BPD who p/w UTI   Met with pt in room today. Pt reports poor appetite and oral intake for 2 months pta. Pt reports a 20lb(12%) wt loss over the past 2 months; per chart pt has lost 18lbs(11%) since 02/2017. Pt does like chocolate Ensure but does not drink it regularly. Pt reports difficulty chewing and swallowing; requires a mechanical soft diet. Pt reports that he has an appointment set up with ENT to have this checked. 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 Recommend IV thiamine supplementation as pt at high risk for Wernicke's. RD will order supplements and ocuvite to encourage wound healing. Pt currently eating <50% of meals. Pt likely at high refeeding risk; recommend monitor K, Mg, and P labs.    Medications reviewed and include: lovenox, folic acid,  morphine, MVI, protonix, carafate, thiamine, zofran, oxycodone   Labs reviewed: K 4.1 wnl, P 2.6 wnl, Mg 1.5(L), prealbumin 12.8(L), folate 10.7 wnl, B12 942(H), ammonia 15  NUTRITION - FOCUSED PHYSICAL EXAM:    Most Recent Value  Orbital Region  No depletion  Upper Arm Region  Mild depletion  Thoracic and Lumbar Region  Mild depletion  Buccal Region  No depletion  Temple Region  No depletion  Clavicle Bone Region  No depletion  Clavicle and Acromion Bone Region  No depletion  Scapular Bone Region  No depletion  Dorsal Hand  No depletion  Patellar Region  Moderate depletion  Anterior Thigh Region  Moderate depletion  Posterior Calf Region  Moderate depletion  Edema (RD Assessment)  None  Hair  Reviewed  Eyes  Reviewed  Mouth  Reviewed  Skin  Reviewed  Nails  Reviewed     Diet Order:   Diet Order           Diet regular Room service appropriate? Yes; Fluid consistency: Thin  Diet effective now         EDUCATION NEEDS:   Education needs have been addressed  Skin:  Skin Assessment: Reviewed RN Assessment(Stage II coccyx, cellulitis BLE )  Last BM:  pta  Height:   Ht Readings from Last 1 Encounters:  11/05/17 6' (1.829 m)    Weight:   Wt Readings from Last 1 Encounters:  11/05/17 150 lb 12.8 oz (68.4 kg)    Ideal Body Weight:  80.9  kg  BMI:  Body mass index is 20.45 kg/m.  Estimated Nutritional Needs:   Kcal:  1900-2200kcal/day   Protein:  82-95g/day   Fluid:  >1.7L/day   Koleen Distance MS, RD, LDN Pager #- [phone] Office#- [phone] After Hours Pager: [phone]

## 2017-11-05 NOTE — Progress Notes (Signed)
Pharmacy Electrolyte Monitoring Consult:  Pharmacy consulted to assist in monitoring and replacing electrolytes in this 57 y.o. male with a h/o PVD, COPD and ETOH abuse admitted on 11/05/2017 with UTI.  Labs:  Sodium (mmol/L)  Date Value  11/05/2017 138  07/13/2014 139   Potassium (mmol/L)  Date Value  11/05/2017 4.1  07/13/2014 3.9   Magnesium (mg/dL)  Date Value  16/03/9603 1.5 (L)   Phosphorus (mg/dL)  Date Value  54/02/8118 2.6   Calcium (mg/dL)  Date Value  14/78/2956 7.8 (L)   Calcium, Total (mg/dL)  Date Value  21/30/8657 8.1 (L)   Albumin (g/dL)  Date Value  84/69/6295 2.1 (L)  07/13/2014 3.1 (L)    Assessment/Plan: Mg sulfate 2 g iv once per MD. Will f/u AM labs.   Luisa Hart D 11/05/2017 1:08 PM

## 2017-11-05 NOTE — ED Notes (Signed)
Pt up to bedside commode, incontinent of watery brown stool.

## 2017-11-05 NOTE — ED Notes (Signed)
Hospitalist in to admit 

## 2017-11-05 NOTE — ED Triage Notes (Addendum)
Pt arrives to ED via [facility] from home with c/o "weakness and declining health" x2 weeks. EMS also reports pt with 1 episode of diarrhea at the pt's residence prior to transport. EMS states pt with multiple falls at home, bilateral lower leg cellulitis.

## 2017-11-05 NOTE — Progress Notes (Signed)
Patient verbalizes severe burning pain to Left great toe and second toe. Redness noted on second toe. MD Gouru aware. PRN oxycodone given.

## 2017-11-05 NOTE — ED Notes (Signed)
Pt incontinent of loose brown stool, peri care done.

## 2017-11-05 NOTE — Consult Note (Signed)
[facility] Consult   Reason for Consult: Consult for 57 year old man in the hospital with acute weakness.  Question about poor appetite Referring Physician: Gouru Patient Identification: Perry Bullock MRN:  [id number] Principal Diagnosis: Dysthymia Diagnosis:   Patient Active Problem List   Diagnosis Date Noted  . Complicated UTI (urinary tract infection) [N39.0] 11/05/2017  . Pressure injury of skin [L89.90] 11/05/2017  . Dysthymia [F34.1] 11/05/2017  . Closed intertrochanteric fracture of hip, right, initial encounter ([place]) [S72.141A] 01/15/2017  . Substance induced mood disorder ([place]) [F19.94] 03/05/2016  . Alcohol abuse [F10.10] 03/05/2016  . Cocaine abuse ([place]) [F14.10] 03/05/2016  . Angioedema [T78.3XXA] 04/22/2015  . Abscess of upper lobe of right lung without pneumonia ([place]) [J85.2] 04/03/2015  . COPD (chronic obstructive pulmonary disease) ([place]) [J44.9] 04/03/2015  . Hep C w/o coma, chronic ([place]) [B18.2] 04/03/2015  . Bipolar disorder ([place]) [F31.9] 04/03/2015    Total Time spent with patient: 45 minutes  Subjective:   Perry Bullock is a 57 y.o. male patient admitted with "I been sick".  HPI: Patient seen chart reviewed.  57 year old man with a history of multiple medical problems comes into the hospital with reports of weakness lack of appetite general malaise.  Patient appears to have a likely urinary tract infection.  He was only partially cooperative with interview today.  He tells me that he has not been eating well for a long time.  He has little appetite and nothing feels appealing.  Denies throwing up but does feel nauseous much of the time.  He also says he sleeps poorly at night and has been losing weight.  Patient feels like his mood is bad.  He denies any suicidal thoughts however.  He denies any hallucinations or psychosis.  Patient says that he still drinks regularly but will not tell me an exact amount.  His alcohol level on presentation was  negative.  Patient takes chronic narcotic pain medicines at home.  He says he is still taking his Seroquel and trazodone at night which is what he relies on to help him to sleep.  Social history: Lives with his wife.  Seems to have very little activity outside the home.  Medical history: COPD hepatitis C past history of abscess  Substance abuse history: Long history of abuse of multiple drugs including alcohol and cocaine.  Patient tells me that he is continue to drinking although the alcohol level on presentation was negative.  Past Psychiatric History: Patient has been seen before in consult.  Thought at that time to probably have mood disorder secondary to substance abuse.  He is on trazodone and Seroquel chronically from his primary care doctor and has been on the same doses for a long time.  I do not think he is ever had a psychiatric hospitalization or suicide attempt.  Risk to Self: Is patient at risk for suicide?: No Risk to Others:   Prior Inpatient Therapy:   Prior Outpatient Therapy:    Past Medical History:  Past Medical History:  Diagnosis Date  . Anxiety   . Arthritis   . Asthma   . Bipolar 1 disorder ([place])   . Blind right eye   . Chronic back pain    Diverticulosis  . Chronic headaches   . Convulsion ([place])    once a month when stands up too quickly  . COPD (chronic obstructive pulmonary disease) ([place])   . DDD (degenerative disc disease), lumbar   . Diverticulitis   . GERD (gastroesophageal reflux  disease)   . Glaucoma   . Hepatitis    hepatitis C  . Pneumonia   . PVD (peripheral vascular disease) ([facility])    lower extremities reddened and feet swollen  . Seasonal allergies   . Shortness of breath dyspnea     Past Surgical History:  Procedure Laterality Date  . EYE SURGERY Right   . INTRAMEDULLARY (IM) NAIL INTERTROCHANTERIC Right 01/16/2017   Procedure: INTRAMEDULLARY (IM) NAIL INTERTROCHANTRIC;  Surgeon: Corky Mull, MD;  Location: [facility] ORS;  Service:  Orthopedics;  Laterality: Right;  . INTUBATION-ENDOTRACHEAL WITH TRACHEOSTOMY STANDBY N/A 04/22/2015   Procedure: INTUBATION-ENDOTRACHEAL WITH TRACHEOSTOMY STANDBY;  Surgeon: Beverly Gust, MD;  Location: [facility] ORS;  Service: ENT;  Laterality: N/A;  . JOINT REPLACEMENT Left    Lt shoulder  . MANDIBLE SURGERY     Family History:  Family History  Problem Relation Age of Onset  . Breast cancer Mother   . Hypertension Father   . Diabetes Father    Family Psychiatric  History: Denies any Social History:  Social History   Substance and Sexual Activity  Alcohol Use Yes  . Alcohol/week: 3.6 oz  . Types: 6 Cans of beer per week   Comment: occ     Social History   Substance and Sexual Activity  Drug Use No    Social History   Socioeconomic History  . Marital status: Single    Spouse name: Not on file  . Number of children: Not on file  . Years of education: Not on file  . Highest education level: Not on file  Occupational History  . Not on file  Social Needs  . Financial resource strain: Not on file  . Food insecurity:    Worry: Not on file    Inability: Not on file  . Transportation needs:    Medical: Not on file    Non-medical: Not on file  Tobacco Use  . Smoking status: Current Every Day Smoker    Packs/day: 0.00    Years: 35.00    Pack years: 0.00    Types: Cigarettes  . Smokeless tobacco: Never Used  . Tobacco comment: 2-3 cigs daily--02/27/16  Substance and Sexual Activity  . Alcohol use: Yes    Alcohol/week: 3.6 oz    Types: 6 Cans of beer per week    Comment: occ  . Drug use: No  . Sexual activity: Yes    Birth control/protection: None  Lifestyle  . Physical activity:    Days per week: Not on file    Minutes per session: Not on file  . Stress: Not on file  Relationships  . Social connections:    Talks on phone: Not on file    Gets together: Not on file    Attends religious service: Not on file    Active member of club or organization: Not on file     Attends meetings of clubs or organizations: Not on file    Relationship status: Not on file  Other Topics Concern  . Not on file  Social History Narrative  . Not on file   Additional Social History:    Allergies:   Allergies  Allergen Reactions  . Ace Inhibitors Swelling    Labs:  Results for orders placed or performed during the hospital encounter of 11/05/17 (from the past 48 hour(s))  Comprehensive metabolic panel     Status: Abnormal   Collection Time: 11/05/17 12:23 AM  Result Value Ref Range   Sodium 138  135 - 145 mmol/L   Potassium 4.1 3.5 - 5.1 mmol/L   Chloride 105 101 - 111 mmol/L   CO2 28 22 - 32 mmol/L   Glucose, Bld 113 (H) 65 - 99 mg/dL   BUN 7 6 - 20 mg/dL   Creatinine, Ser 0.62 0.61 - 1.24 mg/dL   Calcium 7.8 (L) 8.9 - 10.3 mg/dL   Total Protein 5.2 (L) 6.5 - 8.1 g/dL   Albumin 2.1 (L) 3.5 - 5.0 g/dL   AST 35 15 - 41 U/L   ALT 19 17 - 63 U/L   Alkaline Phosphatase 145 (H) 38 - 126 U/L   Total Bilirubin 1.1 0.3 - 1.2 mg/dL   GFR calc non Af Amer >60 >60 mL/min   GFR calc Af Amer >60 >60 mL/min    Comment: (NOTE) The eGFR has been calculated using the CKD EPI equation. This calculation has not been validated in all clinical situations. eGFR's persistently <60 mL/min signify possible Chronic Kidney Disease.    Anion gap 5 5 - 15    Comment: Performed at [facility], [address] [place], [place] [address]  CBC with Differential     Status: Abnormal   Collection Time: 11/05/17 12:23 AM  Result Value Ref Range   WBC 8.2 3.8 - 10.6 K/uL   RBC 3.59 (L) 4.40 - 5.90 MIL/uL   Hemoglobin 12.6 (L) 13.0 - 18.0 g/dL   HCT 36.9 (L) 40.0 - 52.0 %   MCV 102.9 (H) 80.0 - 100.0 fL   MCH 35.2 (H) 26.0 - 34.0 pg   MCHC 34.2 32.0 - 36.0 g/dL   RDW 15.4 (H) 11.5 - 14.5 %   Platelets 326 150 - 440 K/uL   Neutrophils Relative % 71 %   Neutro Abs 5.9 1.4 - 6.5 K/uL   Lymphocytes Relative 12 %   Lymphs Abs 1.0 1.0 - 3.6 K/uL   Monocytes Relative 14  %   Monocytes Absolute 1.1 (H) 0.2 - 1.0 K/uL   Eosinophils Relative 2 %   Eosinophils Absolute 0.2 0 - 0.7 K/uL   Basophils Relative 1 %   Basophils Absolute 0.0 0 - 0.1 K/uL    Comment: Performed at [facility], [address], [place], [place] [address]  Protime-INR     Status: None   Collection Time: 11/05/17 12:23 AM  Result Value Ref Range   Prothrombin Time 13.1 11.4 - 15.2 seconds   INR 1.00     Comment: Performed at [facility], [address] [place], [place] [address]  CK     Status: None   Collection Time: 11/05/17 12:23 AM  Result Value Ref Range   Total CK 86 49 - 397 U/L    Comment: Performed at [facility], [address] [place], [place] [address]  Beta-hydroxybutyric acid     Status: Abnormal   Collection Time: 11/05/17 12:23 AM  Result Value Ref Range   Beta-Hydroxybutyric Acid 0.45 (H) 0.05 - 0.27 mmol/L    Comment: Performed at [facility], [address] [place], [place] [address]  Ethanol     Status: None   Collection Time: 11/05/17 12:23 AM  Result Value Ref Range   Alcohol, Ethyl (B) <10 <10 mg/dL    Comment:        LOWEST DETECTABLE LIMIT FOR SERUM ALCOHOL IS 10 mg/dL FOR MEDICAL PURPOSES ONLY Performed at [facility], [address] [place], [place] [address]   Troponin I  Status: None   Collection Time: 11/05/17 12:23 AM  Result Value Ref Range   Troponin I <0.03 <0.03 ng/mL    Comment: Performed at [facility], [address] [place], [place] [address]  Lipase, blood     Status: None   Collection Time: 11/05/17 12:23 AM  Result Value Ref Range   Lipase 21 11 - 51 U/L    Comment: Performed at [facility], [address] [place], [place] [address]  Urinalysis, Complete w Microscopic     Status: Abnormal   Collection Time: 11/05/17 12:25 AM  Result Value Ref Range   Color, Urine AMBER (A) YELLOW    Comment: BIOCHEMICALS MAY BE AFFECTED BY COLOR   APPearance  CLOUDY (A) CLEAR   Specific Gravity, Urine 1.017 1.005 - 1.030   pH 6.0 5.0 - 8.0   Glucose, UA NEGATIVE NEGATIVE mg/dL   Hgb urine dipstick SMALL (A) NEGATIVE   Bilirubin Urine NEGATIVE NEGATIVE   Ketones, ur 5 (A) NEGATIVE mg/dL   Protein, ur 30 (A) NEGATIVE mg/dL   Nitrite POSITIVE (A) NEGATIVE   Leukocytes, UA LARGE (A) NEGATIVE   RBC / HPF 21-50 0 - 5 RBC/hpf   WBC, UA >50 (H) 0 - 5 WBC/hpf   Bacteria, UA MANY (A) NONE SEEN   Squamous Epithelial / LPF 0-5 0 - 5    Comment: Please note change in reference range.   WBC Clumps PRESENT    Mucus PRESENT     Comment: Performed at [facility], [address] [place], [place] [address]  Urine Drug Screen, Qualitative     Status: Abnormal   Collection Time: 11/05/17 12:25 AM  Result Value Ref Range   Tricyclic, Ur Screen NONE DETECTED NONE DETECTED   Amphetamines, Ur Screen NONE DETECTED NONE DETECTED   MDMA (Ecstasy)Ur Screen NONE DETECTED NONE DETECTED   Cocaine Metabolite,Ur [place] NONE DETECTED NONE DETECTED   Opiate, Ur Screen POSITIVE (A) NONE DETECTED   Phencyclidine (PCP) Ur S NONE DETECTED NONE DETECTED   Cannabinoid 50 Ng, Ur [place] NONE DETECTED NONE DETECTED   Barbiturates, Ur Screen NONE DETECTED NONE DETECTED   Benzodiazepine, Ur Scrn NONE DETECTED NONE DETECTED   Methadone Scn, Ur NONE DETECTED NONE DETECTED    Comment: (NOTE) Tricyclics + metabolites, urine    Cutoff 1000 ng/mL Amphetamines + metabolites, urine  Cutoff 1000 ng/mL MDMA (Ecstasy), urine              Cutoff 500 ng/mL Cocaine Metabolite, urine          Cutoff 300 ng/mL Opiate + metabolites, urine        Cutoff 300 ng/mL Phencyclidine (PCP), urine         Cutoff 25 ng/mL Cannabinoid, urine                 Cutoff 50 ng/mL Barbiturates + metabolites, urine  Cutoff 200 ng/mL Benzodiazepine, urine              Cutoff 200 ng/mL Methadone, urine                   Cutoff 300 ng/mL The urine drug screen provides only a preliminary, unconfirmed analytical  test result and should not be used for non-medical purposes. Clinical consideration and professional judgment should be applied to any positive drug screen result due to possible interfering substances. A more specific alternate chemical method must be used in order to obtain a confirmed analytical result. Gas chromatography /  mass spectrometry (GC/MS) is the preferred confirmat ory method. Performed at [facility], [address] [place], [place] [address]   Brain natriuretic peptide     Status: Abnormal   Collection Time: 11/05/17 12:26 AM  Result Value Ref Range   B Natriuretic Peptide 162.0 (H) 0.0 - 100.0 pg/mL    Comment: Performed at [facility], [address] [place], [place] [address]  Ammonia     Status: None   Collection Time: 11/05/17 12:26 AM  Result Value Ref Range   Ammonia 15 9 - 35 umol/L    Comment: Performed at [facility], [address] [place], [place] [address]  Prealbumin     Status: Abnormal   Collection Time: 11/05/17  7:07 AM  Result Value Ref Range   Prealbumin 12.8 (L) 18 - 38 mg/dL    Comment: Performed at [facility], [place] [address], [place], [place] [address]  Magnesium     Status: Abnormal   Collection Time: 11/05/17  7:07 AM  Result Value Ref Range   Magnesium 1.5 (L) 1.7 - 2.4 mg/dL    Comment: Performed at [facility], [address] [place], [place] [address]  Phosphorus     Status: None   Collection Time: 11/05/17  7:07 AM  Result Value Ref Range   Phosphorus 2.6 2.5 - 4.6 mg/dL    Comment: Performed at [facility], [address] [place], [place] [address]  Vitamin B12     Status: Abnormal   Collection Time: 11/05/17  7:07 AM  Result Value Ref Range   Vitamin B-12 942 (H) 180 - 914 pg/mL    Comment: (NOTE) This assay is not validated for testing neonatal or myeloproliferative syndrome specimens for Vitamin B12 levels. Performed at [facility], [address] [address], [place], [place] [address]   Folate     Status: None   Collection Time: 11/05/17  7:07 AM  Result Value Ref Range   Folate 10.7 >5.9 ng/mL    Comment: Performed at [facility], [address] [place], [place] [address]  Hemoglobin A1c     Status: Abnormal   Collection Time: 11/05/17  7:07 AM  Result Value Ref Range   Hgb A1c MFr Bld 4.4 (L) 4.8 - 5.6 %    Comment: (NOTE) Pre diabetes:          5.7%-6.4% Diabetes:              >6.4% Glycemic control for   <7.0% adults with diabetes    Mean Plasma Glucose 79.58 mg/dL    Comment: Performed at [facility] [address] [place], [place] [address]    Current Facility-Administered Medications  Medication Dose Route Frequency Provider Last Rate Last Dose  . albuterol (PROVENTIL) (2.5 MG/3ML) 0.083% nebulizer solution 2.5 mg  2.5 mg Nebulization Q6H PRN Arta Silence, MD      . bisacodyl (DULCOLAX) EC tablet 5 mg  5 mg Oral Daily PRN Arta Silence, MD      . Derrill Memo ON 11/06/2017] cefTRIAXone (ROCEPHIN) 1 g in sodium chloride 0.9 % 100 mL IVPB  1 g Intravenous Daily Sridharan, Prasanna, MD      . enoxaparin (LOVENOX) injection 40 mg  40 mg Subcutaneous Q24H Arta Silence, MD   40 mg at 11/05/17 0918  . feeding supplement (ENSURE ENLIVE) (ENSURE ENLIVE) liquid 237 mL  237 mL Oral TID BM Nicholes Mango, MD      .  fluticasone furoate-vilanterol (BREO ELLIPTA) 200-25 MCG/INH 1 puff  1 puff Inhalation Daily Arta Silence, MD   1 puff at 11/05/17 0918  . folic acid (FOLVITE) tablet 1 mg  1 mg Oral Daily Arta Silence, MD   1 mg at 11/05/17 0917  . gabapentin (NEURONTIN) capsule 900 mg  900 mg Oral TID Arta Silence, MD   900 mg at 11/05/17 1733  . latanoprost (XALATAN) 0.005 % ophthalmic solution 1 drop  1 drop Both Eyes QHS Sridharan, Prasanna, MD      . LORazepam (ATIVAN) tablet 1 mg  1 mg Oral Q6H PRN Arta Silence, MD       Or  . LORazepam (ATIVAN) injection 1 mg  1 mg Intravenous Q6H  PRN Arta Silence, MD      . mirtazapine (REMERON) tablet 15 mg  15 mg Oral QHS Clapacs, John T, MD      . Derrill Memo ON 11/06/2017] morphine (MS CONTIN) 12 hr tablet 15 mg  15 mg Oral Q12H Gouru, Aruna, MD      . multivitamin with minerals tablet 1 tablet  1 tablet Oral Daily Arta Silence, MD   1 tablet at 11/05/17 0918  . multivitamin-lutein (OCUVITE-LUTEIN) capsule 1 capsule  1 capsule Oral Daily Gouru, Aruna, MD   1 capsule at 11/05/17 1733  . ondansetron (ZOFRAN) tablet 4 mg  4 mg Oral Q6H PRN Arta Silence, MD   4 mg at 11/05/17 0924   Or  . ondansetron (ZOFRAN) injection 4 mg  4 mg Intravenous Q6H PRN Arta Silence, MD      . oxyCODONE (Oxy IR/ROXICODONE) immediate release tablet 5-10 mg  5-10 mg Oral Q4H PRN Arta Silence, MD   5 mg at 11/05/17 1441  . pantoprazole (PROTONIX) EC tablet 40 mg  40 mg Oral Daily Arta Silence, MD   40 mg at 11/05/17 0917  . polyvinyl alcohol (LIQUIFILM TEARS) 1.4 % ophthalmic solution 1 drop  1 drop Both Eyes QID Gouru, Aruna, MD      . QUEtiapine (SEROQUEL) tablet 300 mg  300 mg Oral QHS Sridharan, Prasanna, MD      . senna-docusate (Senokot-S) tablet 1 tablet  1 tablet Oral QHS PRN Arta Silence, MD      . sucralfate (CARAFATE) tablet 1 g  1 g Oral TID WC & HS Arta Silence, MD   1 g at 11/05/17 1733  . tamsulosin (FLOMAX) capsule 0.4 mg  0.4 mg Oral Daily Arta Silence, MD   0.4 mg at 11/05/17 0918  . thiamine (VITAMIN B-1) tablet 100 mg  100 mg Oral Daily Arta Silence, MD   100 mg at 11/05/17 0917  . tiotropium (SPIRIVA) inhalation capsule 18 mcg  18 mcg Inhalation Daily Arta Silence, MD   18 mcg at 11/05/17 0918  . traZODone (DESYREL) tablet 200 mg  200 mg Oral QHS Clapacs, John T, MD      . zolpidem (AMBIEN) tablet 10 mg  10 mg Oral QHS PRN Arta Silence, MD        Musculoskeletal: Strength & Muscle Tone: decreased Gait & Station: unable to stand Patient leans: N/A  Psychiatric  Specialty Exam: Physical Exam  Nursing note and vitals reviewed. Constitutional: He appears well-developed and well-nourished.  HENT:  Head: Normocephalic and atraumatic.  Eyes: Pupils are equal, round, and reactive to light. Conjunctivae are normal.  Neck: Normal range of motion.  Cardiovascular: Regular rhythm and normal heart sounds.  Respiratory: Effort normal. No respiratory distress.  GI: Soft.  Musculoskeletal:  Normal range of motion.  Neurological: He is alert.  Skin: Skin is warm and dry.  Psychiatric: His affect is blunt. His speech is delayed. He is slowed. Cognition and memory are impaired. He expresses impulsivity. He expresses no suicidal ideation.    Review of Systems  Constitutional: Positive for malaise/fatigue and weight loss.  HENT: Negative.   Eyes: Negative.   Respiratory: Negative.   Cardiovascular: Negative.   Gastrointestinal: Positive for nausea.  Musculoskeletal: Negative.   Skin: Negative.   Neurological: Negative.   Psychiatric/Behavioral: Positive for depression and substance abuse. Negative for hallucinations, memory loss and suicidal ideas. The patient is nervous/anxious and has insomnia.     Blood pressure 102/77, pulse (!) 115, temperature 98.5 F (36.9 C), temperature source Oral, resp. rate 19, height 6' (1.829 m), weight 68.4 kg (150 lb 12.8 oz), SpO2 100 %.Body mass index is 20.45 kg/m.  General Appearance: Disheveled  Eye Contact:  Minimal  Speech:  Garbled  Volume:  Decreased  Mood:  Dysphoric  Affect:  Constricted  Thought Process:  Goal Directed  Orientation:  Full (Time, Place, and Person)  Thought Content:  Rumination  Suicidal Thoughts:  No  Homicidal Thoughts:  No  Memory:  Immediate;   Fair Recent;   Fair Remote;   Fair  Judgement:  Fair  Insight:  Fair  Psychomotor Activity:  Decreased  Concentration:  Concentration: Fair  Recall:  [employer] of Knowledge:  Fair  Language:  Fair  Akathisia:  No  Handed:  Right  AIMS  (if indicated):     Assets:  Housing  ADL's:  Impaired  Cognition:  Impaired,  Mild  Sleep:        Treatment Plan Summary: Medication management and Plan 57 year old man comes in the hospital with general malaise and weakness.  Concerned about his not eating.  Patient could have several reasons why he is not hungry and not eating.  The urinary tract infection could be 1.  Chronic alcohol abuse and/or alcohol withdrawal could be another.  Chronic hepatitis could be another.  Patient does seem dysphoric but is not suicidal and does not necessarily have a major depression.  I suggested that we try adding some mirtazapine at night 15 mg to help with appetite and sleep.  The patient's chief complaint is he wants a better night sleep.  I have also increased his trazodone to 200 mg at night.  The Seroquel is already at 300 and higher doses are probably not likely to help him sleep any better.  Does not need commitment does not need psychiatric hospitalization.  Disposition: No evidence of imminent risk to self or others at present.   Patient does not meet criteria for psychiatric inpatient admission. Supportive therapy provided about ongoing stressors.  Alethia Berthold, MD 11/05/2017 7:12 PM

## 2017-11-05 NOTE — ED Provider Notes (Signed)
[facility] Emergency Department Provider Note  ____________________________________________   First MD Initiated Contact with Patient 11/05/17 0014     (approximate)  I have reviewed the triage vital signs and the nursing notes.   HISTORY  Chief Complaint Weakness  History is limited by the patient's clinical condition  HPI Perry Bullock is a 57 y.o. male who comes to the emergency department via EMS from home.  Apparently the patient's wife called 911 because she was concerned that the patient has lost the will to live and is no longer caring for himself.  He had a large bowel movement on himself today and has not been eating or drinking recently.  He says that he has lost most energy and does not seem to want to do anything.  His girlfriend called 911 and he would not have sought medical care today had she not call.  Past Medical History:  Diagnosis Date  . Anxiety   . Arthritis   . Asthma   . Bipolar 1 disorder (HCC)   . Blind right eye   . Chronic back pain    Diverticulosis  . Chronic headaches   . Convulsion (HCC)    once a month when stands up too quickly  . COPD (chronic obstructive pulmonary disease) (HCC)   . DDD (degenerative disc disease), lumbar   . Diverticulitis   . GERD (gastroesophageal reflux disease)   . Glaucoma   . Hepatitis    hepatitis C  . Pneumonia   . PVD (peripheral vascular disease) (HCC)    lower extremities reddened and feet swollen  . Seasonal allergies   . Shortness of breath dyspnea     Patient Active Problem List   Diagnosis Date Noted  . Complicated UTI (urinary tract infection) 11/05/2017  . Pressure injury of skin 11/05/2017  . Dysthymia 11/05/2017  . Closed intertrochanteric fracture of hip, right, initial encounter (HCC) 01/15/2017  . Substance induced mood disorder (HCC) 03/05/2016  . Alcohol abuse 03/05/2016  . Cocaine abuse (HCC) 03/05/2016  . Angioedema 04/22/2015  . Abscess of upper lobe  of right lung without pneumonia (HCC) 04/03/2015  . COPD (chronic obstructive pulmonary disease) (HCC) 04/03/2015  . Hep C w/o coma, chronic (HCC) 04/03/2015  . Bipolar disorder (HCC) 04/03/2015    Past Surgical History:  Procedure Laterality Date  . EYE SURGERY Right   . INTRAMEDULLARY (IM) NAIL INTERTROCHANTERIC Right 01/16/2017   Procedure: INTRAMEDULLARY (IM) NAIL INTERTROCHANTRIC;  Surgeon: Christena Flake, MD;  Location: [facility] ORS;  Service: Orthopedics;  Laterality: Right;  . INTUBATION-ENDOTRACHEAL WITH TRACHEOSTOMY STANDBY N/A 04/22/2015   Procedure: INTUBATION-ENDOTRACHEAL WITH TRACHEOSTOMY STANDBY;  Surgeon: Linus Salmons, MD;  Location: [facility] ORS;  Service: ENT;  Laterality: N/A;  . JOINT REPLACEMENT Left    Lt shoulder  . MANDIBLE SURGERY      Prior to Admission medications   Medication Sig Start Date End Date Taking? Authorizing Provider  albuterol (PROAIR HFA) 108 (90 Base) MCG/ACT inhaler Inhale 2 puffs into the lungs every 6 (six) hours as needed for wheezing or shortness of breath. 02/27/16  Yes Erin Fulling, MD  cetirizine (ZYRTEC) 10 MG tablet Take 10 mg by mouth daily.   Yes [provider]  EMBEDA 30-1.2 MG CPCR Take 1 capsule by mouth daily. 10/23/17  Yes [provider]  fluticasone (FLONASE) 50 MCG/ACT nasal spray Place 2 sprays into both nostrils as needed.    Yes [provider]  gabapentin (NEURONTIN) 600 MG tablet Take  600 mg by mouth 3 (three) times daily.    Yes [provider]  mometasone-formoterol (DULERA) 200-5 MCG/ACT AERO Inhale 2 puffs into the lungs 2 (two) times daily. 02/27/16  Yes Kasa, Wallis Bamberg, MD  ondansetron (ZOFRAN) 4 MG tablet Take 1 tablet (4 mg total) by mouth every 8 (eight) hours as needed for nausea. 04/06/15  Yes Enedina Finner, MD  oxyCODONE (OXY IR/ROXICODONE) 5 MG immediate release tablet Take 1-2 tablets (5-10 mg total) by mouth every 4 (four) hours as needed for moderate pain. 01/18/17  Yes Evon Slack,  PA-C  QUEtiapine (SEROQUEL) 200 MG tablet Take 200 mg by mouth at bedtime.    Yes [provider]  traZODone (DESYREL) 100 MG tablet Take 300 mg by mouth at bedtime.    Yes [provider]  zolpidem (AMBIEN) 10 MG tablet Take 10 mg by mouth at bedtime as needed for sleep.   Yes [provider]  albuterol (PROVENTIL) (2.5 MG/3ML) 0.083% nebulizer solution Take 3 mLs (2.5 mg total) by nebulization every 6 (six) hours as needed for wheezing or shortness of breath. 12/25/15   Shane Crutch, MD  Carboxymethylcellulose Sodium (THERATEARS) 0.25 % SOLN Apply 2 drops to eye 4 (four) times daily as needed.     [provider]  enoxaparin (LOVENOX) 40 MG/0.4ML injection Inject 0.4 mLs (40 mg total) into the skin daily. Patient not taking: Reported on 11/05/2017 01/18/17 02/01/17  Evon Slack, PA-C  morphine (MS CONTIN) 30 MG 12 hr tablet Take 1 tablet (30 mg total) by mouth daily. Patient not taking: Reported on 11/05/2017 01/20/17   Enedina Finner, MD  nicotine (NICODERM CQ - DOSED IN MG/24 HOURS) 21 mg/24hr patch Place 1 patch (21 mg total) onto the skin daily. Patient not taking: Reported on 11/05/2017 11/22/15   Shane Crutch, MD  tamsulosin (FLOMAX) 0.4 MG CAPS capsule Take 1 capsule (0.4 mg total) by mouth daily. Patient not taking: Reported on 11/05/2017 08/17/17   Riki Altes, MD  tiotropium (SPIRIVA) 18 MCG inhalation capsule Place 1 capsule (18 mcg total) into inhaler and inhale daily. Patient not taking: Reported on 11/05/2017 02/27/16   Erin Fulling, MD    Allergies Ace inhibitors  Family History  Problem Relation Age of Onset  . Breast cancer Mother   . Hypertension Father   . Diabetes Father     Social History Social History   Tobacco Use  . Smoking status: Current Every Day Smoker    Packs/day: 0.00    Years: 35.00    Pack years: 0.00    Types: Cigarettes  . Smokeless tobacco: Never Used  . Tobacco comment: 2-3 cigs daily--02/27/16    Substance Use Topics  . Alcohol use: Yes    Alcohol/week: 3.6 oz    Types: 6 Cans of beer per week    Comment: occ  . Drug use: No    Review of Systems Level 5 exemption history limited by the patient's clinical condition  ____________________________________________   PHYSICAL EXAM:  VITAL SIGNS: ED Triage Vitals  Enc Vitals Group     BP      Pulse      Resp      Temp      Temp src      SpO2      Weight      Height      Head Circumference      Peak Flow      Pain Score  Pain Loc      Pain Edu?      Excl. in GC?     Constitutional: Appears disheveled and chronically ill-appearing.  No alcohol on his breath.  Seems somewhat apathetic Eyes: PERRL EOMI. midrange and brisk Head: Atraumatic. Nose: No congestion/rhinnorhea. Mouth/Throat: No trismus Neck: No stridor.   Cardiovascular: Normal rate, regular rhythm. Grossly normal heart sounds.  Good peripheral circulation. Respiratory: Normal respiratory effort.  No retractions. Lungs CTAB and moving good air Gastrointestinal: Soft mild diffuse tenderness but no rebound or guarding no peritonitis Musculoskeletal: No lower extremity edema   Neurologic: Moves all 4 Skin:  Skin is warm, dry and intact. No rash noted. Psychiatric: Sad affect    ____________________________________________   DIFFERENTIAL includes but not limited to  Dehydration, starvation, depression, hyperammonemia, sepsis ____________________________________________   LABS (all labs ordered are listed, but only abnormal results are displayed)  Labs Reviewed  URINE CULTURE - Abnormal; Notable for the following components:      Result Value   Culture MULTIPLE SPECIES PRESENT, SUGGEST RECOLLECTION (*)    All other components within normal limits  COMPREHENSIVE METABOLIC PANEL - Abnormal; Notable for the following components:   Glucose, Bld 113 (*)    Calcium 7.8 (*)    Total Protein 5.2 (*)    Albumin 2.1 (*)    Alkaline Phosphatase 145  (*)    All other components within normal limits  CBC WITH DIFFERENTIAL/PLATELET - Abnormal; Notable for the following components:   RBC 3.59 (*)    Hemoglobin 12.6 (*)    HCT 36.9 (*)    MCV 102.9 (*)    MCH 35.2 (*)    RDW 15.4 (*)    Monocytes Absolute 1.1 (*)    All other components within normal limits  URINALYSIS, COMPLETE (UACMP) WITH MICROSCOPIC - Abnormal; Notable for the following components:   Color, Urine AMBER (*)    APPearance CLOUDY (*)    Hgb urine dipstick SMALL (*)    Ketones, ur 5 (*)    Protein, ur 30 (*)    Nitrite POSITIVE (*)    Leukocytes, UA LARGE (*)    WBC, UA >50 (*)    Bacteria, UA MANY (*)    All other components within normal limits  URINE DRUG SCREEN, QUALITATIVE ([facility] ONLY) - Abnormal; Notable for the following components:   Opiate, Ur Screen POSITIVE (*)    All other components within normal limits  BETA-HYDROXYBUTYRIC ACID - Abnormal; Notable for the following components:   Beta-Hydroxybutyric Acid 0.45 (*)    All other components within normal limits  BRAIN NATRIURETIC PEPTIDE - Abnormal; Notable for the following components:   B Natriuretic Peptide 162.0 (*)    All other components within normal limits  PREALBUMIN - Abnormal; Notable for the following components:   Prealbumin 12.8 (*)    All other components within normal limits  MAGNESIUM - Abnormal; Notable for the following components:   Magnesium 1.5 (*)    All other components within normal limits  VITAMIN B12 - Abnormal; Notable for the following components:   Vitamin B-12 942 (*)    All other components within normal limits  HEMOGLOBIN A1C - Abnormal; Notable for the following components:   Hgb A1c MFr Bld 4.4 (*)    All other components within normal limits  BASIC METABOLIC PANEL - Abnormal; Notable for the following components:   Calcium 7.4 (*)    All other components within normal limits  URIC ACID - Abnormal; Notable for  the following components:   Uric Acid, Serum 7.8  (*)    All other components within normal limits  MAGNESIUM - Abnormal; Notable for the following components:   Magnesium 1.5 (*)    All other components within normal limits  BASIC METABOLIC PANEL - Abnormal; Notable for the following components:   Glucose, Bld 103 (*)    BUN <5 (*)    Creatinine, Ser 0.53 (*)    Calcium 7.5 (*)    All other components within normal limits  STOOL CULTURE  PROTIME-INR  CK  ETHANOL  TROPONIN I  LIPASE, BLOOD  AMMONIA  PHOSPHORUS  FOLATE  MAGNESIUM  PHOSPHORUS  PHOSPHORUS  LACTOFERRIN, FECAL,QUALITATIVE  MAGNESIUM  BASIC METABOLIC PANEL    Lab work reviewed by me with urinalysis which is consistent with infection Low albumin could be secondary to cirrhosis versus chronically poor nutrition __________________________________________  EKG  ED ECG REPORT I, Merrily Brittle, the attending physician, personally viewed and interpreted this ECG.  Date: 11/05/2017 EKG Time:  Rate: 110 Rhythm: Sinus tachycardia QRS Axis: Rightward axis Intervals: normal ST/T Wave abnormalities: normal Narrative Interpretation: no evidence of acute ischemia  ____________________________________________  RADIOLOGY  Ultrasound of the right upper quadrant reviewed by me with cholelithiasis with no evidence of cholecystitis ____________________________________________   PROCEDURES  Procedure(s) performed: no  Procedures  Critical Care performed: no  Observation: no ____________________________________________   INITIAL IMPRESSION / ASSESSMENT AND PLAN / ED COURSE  Pertinent labs & imaging results that were available during my care of the patient were reviewed by me and considered in my medical decision making (see chart for details).  The patient arrives chronically ill-appearing and largely apathetic.  Differential is extremely broad but includes psychiatric versus medical etiologies of the symptoms.  Broad work-up is pending including ammonia  and LFTs to evaluate for liver disease given his reported alcohol history.  The patient has a low albumin and I am concerned that he may have undiagnosed cirrhosis.  Ultrasound is pending.  The patient's ultrasound does not seem to show cirrhosis.  At this point his urinalysis is back concerning for infection.  Given his deconditioned state I do not believe she is safe to go home as he is nearly unable to ambulate on his own and his nutritional status is awful.  Cultures have been sent and I will cover him with a gram of ceftriaxone now and he requires inpatient admission for continued work-up and management of his acute infection and decline.  The patient verbalized understanding and agreement the plan.      ____________________________________________   FINAL CLINICAL IMPRESSION(S) / ED DIAGNOSES  Final diagnoses:  Cirrhosis (HCC)  Weakness  Alcohol abuse  Acute cystitis with hematuria      NEW MEDICATIONS STARTED DURING THIS VISIT:  Current Discharge Medication List       Note:  This document was prepared using Dragon voice recognition software and may include unintentional dictation errors.     Merrily Brittle, MD 11/07/17 2216

## 2017-11-05 NOTE — H&P (Signed)
[facility] - [place] at [facility]   PATIENT NAME: Perry Bullock    MR#:  [id number]  DATE OF BIRTH:  1961/03/31  DATE OF ADMISSION:  11/05/2017  PRIMARY CARE PHYSICIAN: Center, [facility]   REQUESTING/REFERRING PHYSICIAN: Merrily Brittle, MD  CHIEF COMPLAINT:   Chief Complaint  Patient presents with  . Weakness    HISTORY OF PRESENT ILLNESS:  Perry Bullock  is a 57 y.o. male with a known history of COPD/asthma, GERD, chronic HCV, EtOH abuse, PVD (per prior documentation), glaucoma, DJD/OA/CBP, anx/BPD who p/w UTI. Pt is AAOx2, and knows his name and the location, but cannot provide the day/month/year. He is grouchy/irritable, but answers most of my questions reasonably appropriately, and is non-combative. He is not in any acute distress, though he appears thin and chronically-ill. He provides incomplete Hx and vague ROS. He endorses 1wk Hx malodorous urine, though he cannot seem to tell me if he has had urinary frequency, urgency, hesitancy, dysuria or burning on urination. He does state that he has seen white material that appears to be pus in the urine. He endorses a 1wk Hx of fever and chills, as well as liquid brown diarrhea, though he cannot quantify. He denies malodorous, green, explosive, purulent or bloody stools. He denies recent ABx or recent hospitalization. He denies sick contacts. He endorses 2d Hx nausea and dry heaving, but denies vomiting. He endorses poor PO intake x2-3d. He endorses AP x1d, but is unable to localize, characterize or stratify the severity of the pain. He appears comfortable. He states the reason for admission was "spots on my legs", but based on the ED documentation, it would appear the pt was brought to the hospital for fecal incontinence, self-care deficit and poor PO intake. The pt does endorse claudication and difficult ambulating, but is not able to provide a timeframe/acuity. There is prior documentation that endorses a Hx  of PVD, though I do not see any previous ABIs.  Pt denies CP, SOB, palpitations, diaphoresis, night sweats, rigors, cough, hemoptysis, wheezing, HA, vertigo, blurred vision, LH, LOC. He is most alarmed about his diarrhea. Endorses daily EtOH, last drink yesterday. He is unable to quantify his EtOH intake. He states he smokes 3-4 cigarettes per day, and declined a nicotine patch.  PAST MEDICAL HISTORY:   Past Medical History:  Diagnosis Date  . Anxiety   . Arthritis   . Asthma   . Bipolar 1 disorder (HCC)   . Blind right eye   . Chronic back pain    Diverticulosis  . Chronic headaches   . Convulsion (HCC)    once a month when stands up too quickly  . COPD (chronic obstructive pulmonary disease) (HCC)   . DDD (degenerative disc disease), lumbar   . Diverticulitis   . GERD (gastroesophageal reflux disease)   . Glaucoma   . Hepatitis    hepatitis C  . Pneumonia   . PVD (peripheral vascular disease) (HCC)    lower extremities reddened and feet swollen  . Seasonal allergies   . Shortness of breath dyspnea     PAST SURGICAL HISTORY:   Past Surgical History:  Procedure Laterality Date  . EYE SURGERY Right   . INTRAMEDULLARY (IM) NAIL INTERTROCHANTERIC Right 01/16/2017   Procedure: INTRAMEDULLARY (IM) NAIL INTERTROCHANTRIC;  Surgeon: Christena Flake, MD;  Location: [facility] ORS;  Service: Orthopedics;  Laterality: Right;  . INTUBATION-ENDOTRACHEAL WITH TRACHEOSTOMY STANDBY N/A 04/22/2015   Procedure: INTUBATION-ENDOTRACHEAL WITH TRACHEOSTOMY STANDBY;  Surgeon: Linus Salmons,  MD;  Location: [facility] ORS;  Service: ENT;  Laterality: N/A;  . JOINT REPLACEMENT Left    Lt shoulder  . MANDIBLE SURGERY      SOCIAL HISTORY:   Social History   Tobacco Use  . Smoking status: Current Every Day Smoker    Packs/day: 0.00    Years: 35.00    Pack years: 0.00    Types: Cigarettes  . Smokeless tobacco: Never Used  . Tobacco comment: 2-3 cigs daily--02/27/16  Substance Use Topics  . Alcohol  use: Yes    Alcohol/week: 3.6 oz    Types: 6 Cans of beer per week    Comment: occ    FAMILY HISTORY:   Family History  Problem Relation Age of Onset  . Breast cancer Mother   . Hypertension Father   . Diabetes Father     DRUG ALLERGIES:   Allergies  Allergen Reactions  . Ace Inhibitors Swelling    REVIEW OF SYSTEMS:   Review of Systems  Constitutional: Positive for chills, fever and malaise/fatigue. Negative for diaphoresis and weight loss.  HENT: Negative for congestion, ear pain, hearing loss, nosebleeds, sinus pain, sore throat and tinnitus.   Eyes: Negative for blurred vision, double vision and photophobia.  Respiratory: Negative for cough, hemoptysis, sputum production, shortness of breath and wheezing.   Cardiovascular: Positive for leg swelling. Negative for chest pain, palpitations, orthopnea, claudication and PND.  Gastrointestinal: Positive for abdominal pain, diarrhea and nausea. Negative for blood in stool, constipation, heartburn, melena and vomiting.  Genitourinary: Negative for dysuria, flank pain, frequency, hematuria and urgency.  Musculoskeletal: Negative for back pain, joint pain, myalgias and neck pain.  Skin: Negative for itching and rash.  Neurological: Positive for weakness (+) generalized weakness. Negative for dizziness, tingling, tremors, sensory change, speech change, focal weakness, seizures, loss of consciousness and headaches.   MEDICATIONS AT HOME:   Prior to Admission medications   Medication Sig Start Date End Date Taking? Authorizing Provider  albuterol (PROAIR HFA) 108 (90 Base) MCG/ACT inhaler Inhale 2 puffs into the lungs every 6 (six) hours as needed for wheezing or shortness of breath. 02/27/16   Erin Fulling, MD  albuterol (PROVENTIL) (2.5 MG/3ML) 0.083% nebulizer solution Take 3 mLs (2.5 mg total) by nebulization every 6 (six) hours as needed for wheezing or shortness of breath. 12/25/15   Shane Crutch, MD  aspirin 81 MG  chewable tablet Chew 81 mg by mouth. 11/07/15   [provider]  Carboxymethylcellulose Sodium (THERATEARS) 0.25 % SOLN Apply 2 drops to eye 4 (four) times daily.    [provider]  cetirizine (ZYRTEC) 10 MG tablet Take 10 mg by mouth daily.    [provider]  enoxaparin (LOVENOX) 40 MG/0.4ML injection Inject 0.4 mLs (40 mg total) into the skin daily. 01/18/17 02/01/17  Evon Slack, PA-C  fluticasone (FLONASE) 50 MCG/ACT nasal spray Place 2 sprays into both nostrils as needed.     [provider]  gabapentin (NEURONTIN) 300 MG capsule Take 900 mg by mouth 3 (three) times daily.     [provider]  latanoprost (XALATAN) 0.005 % ophthalmic solution Place 1 drop into both eyes at bedtime.    [provider]  mometasone-formoterol (DULERA) 200-5 MCG/ACT AERO Inhale 2 puffs into the lungs 2 (two) times daily. 02/27/16   Erin Fulling, MD  morphine (MS CONTIN) 30 MG 12 hr tablet Take 1 tablet (30 mg total) by mouth daily. 01/20/17   Enedina Finner, MD  nicotine (NICODERM CQ -  DOSED IN MG/24 HOURS) 21 mg/24hr patch Place 1 patch (21 mg total) onto the skin daily. 11/22/15   Shane Crutch, MD  ondansetron (ZOFRAN) 4 MG tablet Take 1 tablet (4 mg total) by mouth every 8 (eight) hours as needed for nausea. 04/06/15   Enedina Finner, MD  oxyCODONE (OXY IR/ROXICODONE) 5 MG immediate release tablet Take 1-2 tablets (5-10 mg total) by mouth every 4 (four) hours as needed for moderate pain. 01/18/17   Evon Slack, PA-C  pantoprazole (PROTONIX) 40 MG tablet Take 40 mg by mouth daily.    [provider]  Polyethyl Glycol-Propyl Glycol (SYSTANE) 0.4-0.3 % SOLN Apply 1 drop to eye.    [provider]  QUEtiapine (SEROQUEL) 300 MG tablet Take 300 mg by mouth at bedtime.    [provider]  sucralfate (CARAFATE) 1 g tablet Take 1 g by mouth 4 (four) times daily -  with meals and at bedtime.    [provider]  tamsulosin  (FLOMAX) 0.4 MG CAPS capsule Take 1 capsule (0.4 mg total) by mouth daily. 08/17/17   Stoioff, Verna Czech, MD  tiotropium (SPIRIVA) 18 MCG inhalation capsule Place 1 capsule (18 mcg total) into inhaler and inhale daily. 02/27/16   Erin Fulling, MD  traZODone (DESYREL) 150 MG tablet Take 150 mg by mouth at bedtime.    [provider]  zolpidem (AMBIEN) 10 MG tablet Take 10 mg by mouth at bedtime as needed for sleep.    [provider]      VITAL SIGNS:  Blood pressure 102/86, pulse (!) 102, temperature 98.4 F (36.9 C), temperature source Oral, resp. rate 20, height 6' (1.829 m), weight 68 kg (150 lb), SpO2 98 %.  PHYSICAL EXAMINATION:  Physical Exam  Constitutional: He appears well-developed. He is active and cooperative.  Non-toxic appearance. He appears ill. No distress.  HENT:  Head: Normocephalic and atraumatic.  Mouth/Throat: No oropharyngeal exudate.  Eyes: Conjunctivae and lids are normal. No scleral icterus. Right pupil is reactive.  (+) R eye blind (2/2 traumatic injury, machine shop, metal debris, 1992)  Neck: Neck supple. No JVD present. No thyromegaly present.  Cardiovascular: Regular rhythm, S1 normal, S2 normal and normal heart sounds.  No extrasystoles are present. Tachycardia present. Exam reveals no gallop, no S3, no S4, no distant heart sounds and no friction rub.  No murmur heard. Pulmonary/Chest: Effort normal and breath sounds normal. No accessory muscle usage or stridor. No apnea, no tachypnea and no bradypnea. No respiratory distress. He has no decreased breath sounds. He has no wheezes. He has no rhonchi. He has no rales.  Abdominal: Soft. Bowel sounds are normal. He exhibits no distension. There is no tenderness. There is no rebound and no guarding.  Musculoskeletal: Normal range of motion. He exhibits edema and tenderness.  Lymphadenopathy:    He has no cervical adenopathy.  Neurological: He is alert.  Skin: Skin is warm and dry. Rash (+) B/L LE  erythema/swelling/TTP noted. He is not diaphoretic. There is erythema (+) B/L LE erythema/swelling/TTP.  Psychiatric: Judgment normal. His mood appears not anxious. His affect is angry. His affect is not blunt, not labile and not inappropriate. His speech is not rapid and/or pressured, not delayed, not tangential and not slurred. He is agitated. He is not aggressive, not hyperactive, not slowed, not withdrawn, not actively hallucinating and not combative. Thought content is not paranoid and not delusional. He does not express impulsivity or inappropriate judgment. He does not exhibit a depressed mood.  He is communicative. He is attentive.   LABORATORY PANEL:   CBC Recent Labs  Lab 11/05/17 0023  WBC 8.2  HGB 12.6*  HCT 36.9*  PLT 326   ------------------------------------------------------------------------------------------------------------------  Chemistries  Recent Labs  Lab 11/05/17 0023  NA 138  K 4.1  CL 105  CO2 28  GLUCOSE 113*  BUN 7  CREATININE 0.62  CALCIUM 7.8*  AST 35  ALT 19  ALKPHOS 145*  BILITOT 1.1   ------------------------------------------------------------------------------------------------------------------  Cardiac Enzymes Recent Labs  Lab 11/05/17 0023  TROPONINI <0.03   ------------------------------------------------------------------------------------------------------------------  RADIOLOGY:  US Abdomen Limited Ruq  Result Date: 11/05/2017 CLINICAL DATA:  57 year old male with abdominal pain and diarrhea. EXAM: ULTRASOUND ABDOMEN LIMITED RIGHT UPPER QUADRANT COMPARISON:  CT of the abdomen pelvis dated 04/23/2015 FINDINGS: Gallbladder: There are small gallstones. There is no gallbladder wall thickening or pericholecystic fluid. Negative sonographic Murphy's sign. Common bile duct: Diameter: 5 mm Liver: Diffuse increased liver echogenicity consistent with fatty infiltration. Portal vein is patent on color Doppler imaging with normal direction  of blood flow towards the liver. IMPRESSION: Cholelithiasis without sonographic evidence of acute cholecystitis. Fatty liver. Electronically Signed   By: Elgie Collard M.D.   On: 11/05/2017 04:08   IMPRESSION AND PLAN:   A/P: 70M UTI, B/L LE erythema/swelling/TTP, diarrhea, EtOH abuse, hypoalbuminemia/malnourishment.  1.) UTI: Tachycardic, however (-) fever/leukocytosis/tachypnea/hypoxia, SIRS (-). U/A (+) UTI, grossly infected per ED. UCx pending. Ceftriaxone 1g IV qD.  2.) B/L LE erythema/swelling/TTP: Pt w/ redness of B/L LE (below knees). (+) TTP. Tachycardic, however (-) fever/leukocytosis/tachypnea/hypoxia, SIRS (-). Cellulitis vs. PVD. Ceftriaxone as above. Ordered for ABIs, does not appear to have had testing performed in past. Symptomatic mgmt, pain ctrl.  3.) Diarrhea: Denies malodorous, green, explosive, purulent or bloody stools. Denies recent ABx or recent hospitalization. Do not suspect C. difficile. Denies sick contacts. Duration ~1wk, persistent, do not suspect viral diarrhea. Possibly 2/2 UTI, vs. Infectious diarrhea vs. noninfectious diarrhea. Stool Cx/WBC pending. If (-) for infxn, Imodium.  4.) EtOH abuse: Drinks daily, last drink yesterday. Does not quantify. EtOH < 10. CIWA, thiamine, folate, MVI.  5.) Hypoalbuminemia/malnourishment: Pt w/ self-care deficit, poor PO intake. Albumin 2.1. Prealbumin, Mag, Phos, B12, Folate levels pending. Nutrition consult.  6.) Hyperglycemia: Glucose 113 on admission. HbA1c pending.  7.) Macrocytic anemia: Hgb 12.6, MCV 102.9. B12, Folate levels pending. Low suspicion for active/acute bleeding. Monitor.  8.) COPD/asthma: DuoNebs PRN. C/w Dulera, Spiriva.  9.) GERD: c/w Protonix, Carafate.  10.) Glaucoma: c/w eyedrops (Theratears, Xalatan, Systane).  11.) DJD/OA/CBP: c/w pain medications.  12.) Anx/BPD: c/w Neurontin, Seroquel, Trazodone, Ambien.  13.) FEN/GI: Regular diet, Protonix, Carafate.  14.) DVT PPx: Lovenox  SQ  qD.  15.) Code status: Full code.  16.) Disposition: Admission, pt expected to stay > 2 midnights.   All the records are reviewed and case discussed with ED provider. Management plans discussed with the patient, family and they are in agreement.  CODE STATUS: Full code.  TOTAL TIME TAKING CARE OF THIS PATIENT: 90 minutes.    Barbaraann Rondo M.D on 11/05/2017 at 6:07 AM  Between 7am to 6pm - Pager - [phone]  After 6pm go to [email or web address] - Scientist, research (life sciences) [facility]  Office  [phone]  CC: Primary care physician; Center, [facility]   Note: This dictation was prepared with Dragon dictation along with smaller phrase technology. Any transcriptional errors that result from this process are unintentional.

## 2017-11-05 NOTE — Progress Notes (Signed)
[facility] - [place] at [facility]   PATIENT NAME: Perry Bullock    MR#:  [id number]  DATE OF BIRTH:  05-Dec-1960  SUBJECTIVE:  CHIEF COMPLAINT: Patient is feeling sad and depressed.  According to the nurses decreased appetite and patient admits not falling asleep during nights Denies any suicidal thoughts or ideation.  As reported by the nurse patient is not eating or drinking much  REVIEW OF SYSTEMS:  CONSTITUTIONAL: No fever, fatigue or weakness.  EYES: No blurred or double vision.  EARS, NOSE, AND THROAT: No tinnitus or ear pain.  RESPIRATORY: No cough, shortness of breath, wheezing or hemoptysis.  CARDIOVASCULAR: No chest pain, orthopnea, edema.  GASTROINTESTINAL: No nausea, vomiting, diarrhea or abdominal pain.  GENITOURINARY: No dysuria, hematuria.  ENDOCRINE: No polyuria, nocturia,  HEMATOLOGY: No anemia, easy bruising or bleeding SKIN: No rash or lesion. MUSCULOSKELETAL: No joint pain or arthritis.   NEUROLOGIC: No tingling, numbness, weakness.  PSYCHIATRY: No anxiety.  Feeling depressed  DRUG ALLERGIES:   Allergies  Allergen Reactions  . Ace Inhibitors Swelling    VITALS:  Blood pressure 102/77, pulse (!) 115, temperature 98.5 F (36.9 C), temperature source Oral, resp. rate 19, height 6' (1.829 m), weight 68.4 kg (150 lb 12.8 oz), SpO2 100 %.  PHYSICAL EXAMINATION:  GENERAL:  57 y.o.-year-old patient lying in the bed with no acute distress.  EYES: Pupils equal, round, reactive to light and accommodation. No scleral icterus. Extraocular muscles intact.  HEENT: Head atraumatic, normocephalic. Oropharynx and nasopharynx clear.  NECK:  Supple, no jugular venous distention. No thyroid enlargement, no tenderness.  LUNGS: Normal breath sounds bilaterally, no wheezing, rales,rhonchi or crepitation. No use of accessory muscles of respiration.  CARDIOVASCULAR: S1, S2 normal. No murmurs, rubs, or gallops.  ABDOMEN: Soft, nontender, nondistended.  Bowel sounds present. No organomegaly or mass.  EXTREMITIES: Left second toe is erythematous.  Left lower extremities with superficial scabs, no signs of infection, no pedal edema, cyanosis, or clubbing.  NEUROLOGIC: Cranial nerves II through XII are intact. Muscle strength 5/5 in all extremities. Sensation intact. Gait not checked.  PSYCHIATRIC: The patient is alert and oriented x 3.  SKIN: No obvious rash, lesion, or ulcer.    LABORATORY PANEL:   CBC Recent Labs  Lab 11/05/17 0023  WBC 8.2  HGB 12.6*  HCT 36.9*  PLT 326   ------------------------------------------------------------------------------------------------------------------  Chemistries  Recent Labs  Lab 11/05/17 0023 11/05/17 0707  NA 138  --   K 4.1  --   CL 105  --   CO2 28  --   GLUCOSE 113*  --   BUN 7  --   CREATININE 0.62  --   CALCIUM 7.8*  --   MG  --  1.5*  AST 35  --   ALT 19  --   ALKPHOS 145*  --   BILITOT 1.1  --    ------------------------------------------------------------------------------------------------------------------  Cardiac Enzymes Recent Labs  Lab 11/05/17 0023  TROPONINI <0.03   ------------------------------------------------------------------------------------------------------------------  RADIOLOGY:  US Arterial Abi (screening Lower Extremity)  Result Date: 11/05/2017 CLINICAL DATA:  Peripheral vascular disease. Bilateral lower extremity rest pain, claudication. Previous tobacco abuse. EXAM: NONINVASIVE PHYSIOLOGIC VASCULAR STUDY OF BILATERAL LOWER EXTREMITIES TECHNIQUE: Evaluation of both lower extremities were performed at rest, including calculation of ankle-brachial indices with single level Doppler, pressure and pulse volume recording. COMPARISON:  None available FINDINGS: Right ABI:  1.49 Left ABI:  1.54 Right Lower Extremity:  Monophasic arterial waveforms distally. Left Lower Extremity:  Monophasic  arterial waveforms distally. > 1.4 Non diagnostic secondary to  incompressible vessel calcifications (medial arterial sclerosis of Monckeberg) IMPRESSION: 1. Non diagnostic secondary to incompressible vessel calcifications (medial arterial sclerosis of Monckeberg) Electronically Signed   By: Corlis Leak M.D.   On: 11/05/2017 11:28   US Abdomen Limited Ruq  Result Date: 11/05/2017 CLINICAL DATA:  57 year old male with abdominal pain and diarrhea. EXAM: ULTRASOUND ABDOMEN LIMITED RIGHT UPPER QUADRANT COMPARISON:  CT of the abdomen pelvis dated 04/23/2015 FINDINGS: Gallbladder: There are small gallstones. There is no gallbladder wall thickening or pericholecystic fluid. Negative sonographic Murphy's sign. Common bile duct: Diameter: 5 mm Liver: Diffuse increased liver echogenicity consistent with fatty infiltration. Portal vein is patent on color Doppler imaging with normal direction of blood flow towards the liver. IMPRESSION: Cholelithiasis without sonographic evidence of acute cholecystitis. Fatty liver. Electronically Signed   By: Elgie Collard M.D.   On: 11/05/2017 04:08    EKG:   Orders placed or performed during the hospital encounter of 11/05/17  . ED EKG  . ED EKG  . EKG 12-Lead  . EKG 12-Lead    ASSESSMENT AND PLAN:    1.) UTI: Tachycardic, however (-) fever/leukocytosis/tachypnea/hypoxia, SIRS (-). U/A (+) UTI, grossly infected per ED. UCx pending. Ceftriaxone 1g IV qD.  2.) B/L LE erythema/swelling/TTP: Pt w/ redness of B/L LE (below knees). (+) TTP. Tachycardic, however (-) fever/leukocytosis/tachypnea/hypoxia, SIRS (-). Cellulitis vs. PVD. Ceftriaxone as above.  Ordered for ABIs-nondiagnostic secondary to incompressible vessel calcifications  3.) Diarrhea: Denies malodorous, green, explosive, purulent or bloody stools. Denies recent ABx or recent hospitalization. Do not suspect C. difficile. Denies sick contacts. Duration ~1wk, persistent, do not suspect viral diarrhea. Possibly 2/2 UTI, vs. Infectious diarrhea vs. noninfectious diarrhea.  Stool Cx/WBC pending. If (-) for infxn, Imodium.  4.) EtOH abuse: Drinks daily, last drink yesterday. Does not quantify. EtOH < 10. CIWA, thiamine, folate, MVI.  5.) Hypoalbuminemia/malnourishment: Pt w/ self-care deficit, poor PO intake. Albumin 2.1. Prealbumin, Mag, Phos, B12, Folate levels pending. Nutrition consult.  6.) Hyperglycemia: Glucose 113 on admission. HbA1c pending.  7.) Macrocytic anemia: Hgb 12.6, MCV 102.9. B12, Folate levels pending. Low suspicion for active/acute bleeding. Monitor.  8.) COPD/asthma: DuoNebs PRN. C/w Dulera, Spiriva.  9.) GERD: c/w Protonix, Carafate.  10.  Hypomagnesemia probably from alcohol abuse replete and recheck  11.  Depression acute; psych consult placed, seen by Dr. Toni Amend who thinks it is dysphoria.  Trazodone dose was increased to 200 mg at night Seroquel is at maximum dose 300 mg.  Recommending to add mirtazapine at night 15 mg to help with appetite and sleep.  Patient does not meet criteria to be placed inpatient psychiatry floor  12.  Erythema of the left second toe-check uric acid level   Disposition: Seems to be homeless at this time, consult social worker  All the records are reviewed and case discussed with Care Management/Social Workerr. Management plans discussed with the patient, he is in agreement CODE STATUS: fc  TOTAL TIME TAKING CARE OF THIS PATIENT: 36 minutes.   POSSIBLE D/C IN 1-2 DAYS, DEPENDING ON CLINICAL CONDITION.  Note: This dictation was prepared with Dragon dictation along with smaller phrase technology. Any transcriptional errors that result from this process are unintentional.   Ramonita Lab M.D on 11/05/2017 at 9:46 PM  Between 7am to 6pm - Pager - [phone] After 6pm go to [email or web address] - password [email or web address] [facility]  [facility]  Office  [phone]  CC: Primary care physician; Center, [facility]

## 2017-11-05 NOTE — ED Notes (Signed)
Pt in with co abd and diarrhea that started today states 2-3 episodes, no vomiting. Denies any fever, states is has had bilat leg swelling and redness for about a week.

## 2017-11-05 NOTE — ED Notes (Signed)
Report called to [facility], pt admitted to [id number]

## 2017-11-05 NOTE — ED Notes (Signed)
Pt to CT

## 2017-11-06 ENCOUNTER — Inpatient Hospital Stay: Payer: Medicaid Other

## 2017-11-06 LAB — BASIC METABOLIC PANEL
ANION GAP: 6 (ref 5–15)
BUN: 6 mg/dL (ref 6–20)
CHLORIDE: 109 mmol/L (ref 101–111)
CO2: 24 mmol/L (ref 22–32)
Calcium: 7.4 mg/dL — ABNORMAL LOW (ref 8.9–10.3)
Creatinine, Ser: 0.7 mg/dL (ref 0.61–1.24)
Glucose, Bld: 97 mg/dL (ref 65–99)
POTASSIUM: 3.5 mmol/L (ref 3.5–5.1)
SODIUM: 139 mmol/L (ref 135–145)

## 2017-11-06 LAB — PHOSPHORUS: PHOSPHORUS: 2.9 mg/dL (ref 2.5–4.6)

## 2017-11-06 LAB — URINE CULTURE

## 2017-11-06 LAB — MAGNESIUM: MAGNESIUM: 2 mg/dL (ref 1.7–2.4)

## 2017-11-06 LAB — URIC ACID: Uric Acid, Serum: 7.8 mg/dL — ABNORMAL HIGH (ref 4.4–7.6)

## 2017-11-06 MED ORDER — DICLOFENAC SODIUM 1 % TD GEL
2.0000 g | Freq: Four times a day (QID) | TRANSDERMAL | Status: DC
  Administered 2017-11-06 – 2017-11-11 (×21): 2 g via TOPICAL
  Filled 2017-11-06: qty 100

## 2017-11-06 NOTE — Clinical Social Work Placement (Signed)
   CLINICAL SOCIAL WORK PLACEMENT  NOTE  Date:  11/06/2017  Patient Details  Name: Perry Bullock MRN: [id number] Date of Birth: Jul 15, 1960  Clinical Social Work is seeking post-discharge placement for this patient at the Skilled  Nursing Facility level of care (*CSW will initial, date and re-position this form in  chart as items are completed):  Yes   Patient/family provided with [facility] Social Work Department's list of facilities offering this level of care within the geographic area requested by the patient (or if unable, by the patient's family).  Yes   Patient/family informed of their freedom to choose among providers that offer the needed level of care, that participate in Medicare, Medicaid or managed care program needed by the patient, have an available bed and are willing to accept the patient.  Yes   Patient/family informed of 's ownership interest in [facility] and [facility], as well as of the fact that they are under no obligation to receive care at these facilities.  PASRR submitted to EDS on 11/06/17     PASRR number received on       Existing PASRR number confirmed on       FL2 transmitted to all facilities in geographic area requested by pt/family on 11/06/17     FL2 transmitted to all facilities within larger geographic area on       Patient informed that his/her managed care company has contracts with or will negotiate with certain facilities, including the following:            Patient/family informed of bed offers received.  Patient chooses bed at       Physician recommends and patient chooses bed at      Patient to be transferred to   on  .  Patient to be transferred to facility by       Patient family notified on   of transfer.  Name of family member notified:        PHYSICIAN       Additional Comment:    _______________________________________________ Josceline Chenard, Darleen Crocker, LCSW 11/06/2017, 4:18 PM

## 2017-11-06 NOTE — NC FL2 (Signed)
[place] MEDICAID FL2 LEVEL OF CARE SCREENING TOOL     IDENTIFICATION  Patient Name: ROEMELLO SPEYER Birthdate: Mar 07, 1961 Sex: male Admission Date (Current Location): 11/05/2017  [facility] and [place] Number:  [address] ([phone]) Facility and Address:  [facility], [address], [place], [place] [address]      Provider Number: [id number]  Attending Physician Name and Address:  Ramonita Lab, MD  Relative Name and Phone Number:       Current Level of Care: Hospital Recommended Level of Care: Skilled Nursing Facility Prior Approval Number:    Date Approved/Denied:   PASRR Number:    Discharge Plan: SNF    Current Diagnoses: Patient Active Problem List   Diagnosis Date Noted  . Complicated UTI (urinary tract infection) 11/05/2017  . Pressure injury of skin 11/05/2017  . Dysthymia 11/05/2017  . Closed intertrochanteric fracture of hip, right, initial encounter (HCC) 01/15/2017  . Substance induced mood disorder (HCC) 03/05/2016  . Alcohol abuse 03/05/2016  . Cocaine abuse (HCC) 03/05/2016  . Angioedema 04/22/2015  . Abscess of upper lobe of right lung without pneumonia (HCC) 04/03/2015  . COPD (chronic obstructive pulmonary disease) (HCC) 04/03/2015  . Hep C w/o coma, chronic (HCC) 04/03/2015  . Bipolar disorder (HCC) 04/03/2015    Orientation RESPIRATION BLADDER Height & Weight     Self, Time, Situation, Place  Normal Incontinent Weight: 150 lb 12.8 oz (68.4 kg) Height:  6' (182.9 cm)  BEHAVIORAL SYMPTOMS/MOOD NEUROLOGICAL BOWEL NUTRITION STATUS      Continent Diet(Diet: DYS 3 )  AMBULATORY STATUS COMMUNICATION OF NEEDS Skin   Extensive Assist Verbally PU Stage and Appropriate Care(pressure ulcer stage 2 on coccyx. )                       Personal Care Assistance Level of Assistance  Bathing, Feeding, Dressing Bathing Assistance: Limited assistance Feeding assistance: Independent Dressing Assistance: Limited assistance      Functional Limitations Info  Sight, Hearing, Speech Sight Info: Impaired Hearing Info: Adequate Speech Info: Adequate    SPECIAL CARE FACTORS FREQUENCY  PT (By licensed PT), OT (By licensed OT)     PT Frequency: (5) OT Frequency: (5)            Contractures      Additional Factors Info  Code Status, Allergies Code Status Info: (Full Code. ) Allergies Info: (Ace Inhibitors)           Current Medications (11/06/2017):  This is the current hospital active medication list Current Facility-Administered Medications  Medication Dose Route Frequency Provider Last Rate Last Dose  . albuterol (PROVENTIL) (2.5 MG/3ML) 0.083% nebulizer solution 2.5 mg  2.5 mg Nebulization Q6H PRN Barbaraann Rondo, MD      . bisacodyl (DULCOLAX) EC tablet 5 mg  5 mg Oral Daily PRN Barbaraann Rondo, MD      . cefTRIAXone (ROCEPHIN) 1 g in sodium chloride 0.9 % 100 mL IVPB  1 g Intravenous Daily Barbaraann Rondo, MD   Stopped at 11/06/17 1216  . diclofenac sodium (VOLTAREN) 1 % transdermal gel 2 g  2 g Topical QID Gouru, Aruna, MD   2 g at 11/06/17 1235  . enoxaparin (LOVENOX) injection 40 mg  40 mg Subcutaneous Q24H Barbaraann Rondo, MD   40 mg at 11/06/17 0742  . feeding supplement (ENSURE ENLIVE) (ENSURE ENLIVE) liquid 237 mL  237 mL Oral TID BM Gouru, Aruna, MD   237 mL at 11/06/17 1535  . fluticasone furoate-vilanterol (  BREO ELLIPTA) 200-25 MCG/INH 1 puff  1 puff Inhalation Daily Barbaraann Rondo, MD   1 puff at 11/06/17 1028  . folic acid (FOLVITE) tablet 1 mg  1 mg Oral Daily Barbaraann Rondo, MD   1 mg at 11/06/17 1028  . gabapentin (NEURONTIN) capsule 900 mg  900 mg Oral TID Barbaraann Rondo, MD   900 mg at 11/06/17 1545  . latanoprost (XALATAN) 0.005 % ophthalmic solution 1 drop  1 drop Both Eyes QHS Barbaraann Rondo, MD   1 drop at 11/05/17 2128  . LORazepam (ATIVAN) tablet 1 mg  1 mg Oral Q6H PRN Barbaraann Rondo, MD       Or  . LORazepam (ATIVAN) injection 1 mg   1 mg Intravenous Q6H PRN Barbaraann Rondo, MD      . mirtazapine (REMERON) tablet 15 mg  15 mg Oral QHS Clapacs, Jackquline Denmark, MD   15 mg at 11/05/17 2131  . morphine (MS CONTIN) 12 hr tablet 15 mg  15 mg Oral Q12H Gouru, Aruna, MD   15 mg at 11/06/17 1028  . multivitamin with minerals tablet 1 tablet  1 tablet Oral Daily Barbaraann Rondo, MD   1 tablet at 11/06/17 1027  . multivitamin-lutein (OCUVITE-LUTEIN) capsule 1 capsule  1 capsule Oral Daily Gouru, Aruna, MD   1 capsule at 11/06/17 1028  . ondansetron (ZOFRAN) tablet 4 mg  4 mg Oral Q6H PRN Barbaraann Rondo, MD   4 mg at 11/06/17 0742   Or  . ondansetron (ZOFRAN) injection 4 mg  4 mg Intravenous Q6H PRN Barbaraann Rondo, MD      . oxyCODONE (Oxy IR/ROXICODONE) immediate release tablet 5-10 mg  5-10 mg Oral Q4H PRN Barbaraann Rondo, MD   10 mg at 11/06/17 1346  . pantoprazole (PROTONIX) EC tablet 40 mg  40 mg Oral Daily Barbaraann Rondo, MD   40 mg at 11/06/17 1028  . polyvinyl alcohol (LIQUIFILM TEARS) 1.4 % ophthalmic solution 1 drop  1 drop Both Eyes QID Gouru, Aruna, MD   1 drop at 11/06/17 1545  . QUEtiapine (SEROQUEL) tablet 300 mg  300 mg Oral QHS Barbaraann Rondo, MD   300 mg at 11/05/17 2128  . senna-docusate (Senokot-S) tablet 1 tablet  1 tablet Oral QHS PRN Barbaraann Rondo, MD      . sucralfate (CARAFATE) tablet 1 g  1 g Oral TID WC & HS Barbaraann Rondo, MD   1 g at 11/06/17 1236  . tamsulosin (FLOMAX) capsule 0.4 mg  0.4 mg Oral Daily Barbaraann Rondo, MD   0.4 mg at 11/06/17 1027  . thiamine (VITAMIN B-1) tablet 100 mg  100 mg Oral Daily Barbaraann Rondo, MD   100 mg at 11/06/17 1027  . tiotropium (SPIRIVA) inhalation capsule 18 mcg  18 mcg Inhalation Daily Barbaraann Rondo, MD   18 mcg at 11/06/17 1029  . traZODone (DESYREL) tablet 200 mg  200 mg Oral QHS Clapacs, John T, MD   200 mg at 11/05/17 2131  . zolpidem (AMBIEN) tablet 10 mg  10 mg Oral QHS PRN Barbaraann Rondo, MD          Discharge Medications: Please see discharge summary for a list of discharge medications.  Relevant Imaging Results:  Relevant Lab Results:   Additional Information (SSN: [id number])  Odessie Polzin, Darleen Crocker, LCSW

## 2017-11-06 NOTE — Evaluation (Signed)
Physical Therapy Evaluation Patient Details Name: Perry Bullock MRN: [id number] DOB: [birth year] Today's Date: 11/06/2017   History of Present Illness  57 y.o. male with a known history of COPD/asthma, GERD, chronic HCV, EtOH abuse, PVD, glaucoma, DJD/OA/CBP, anx/BPD who p/w UTI.  Admitted with dysthymia  Clinical Impression  Pt generally willing to participate but having b/l calf pain/tightness and is hesitant to do a lot.  Once up and standing he could not tolerate a lot of WBing was unable to walk even 10 ft with heavy reliance on the walker.  Pt fatigues quickly with the effort and though he was able to do ~ 10 minutes of activities apart from the exam (seated balance, standing tolerance, minimal ambulation) he is much weaker and more limited from his baseline and is not safe to return home in his current state.    Follow Up Recommendations SNF    Equipment Recommendations       Recommendations for Other Services       Precautions / Restrictions Precautions Precautions: Fall Restrictions Weight Bearing Restrictions: No      Mobility  Bed Mobility Overal bed mobility: Modified Independent             General bed mobility comments: Pt struggles with getting in/out of bed, c/o pain in lower legs but able to transition w/o assist  Transfers Overall transfer level: Needs assistance Equipment used: Rolling walker (2 wheeled) Transfers: Sit to/from Stand Sit to Stand: Min assist         General transfer comment: Pt showed good effort in getting to standing, ultimately needed assist to rise and showed poor overall WBing tolerance, balance and safety awareness once up  Ambulation/Gait Ambulation/Gait assistance: Min assist Ambulation Distance (Feet): 8 Feet Assistive device: Rolling walker (2 wheeled)       General Gait Details: Pt with very stooped, shuffling, unsafe gait.  He reports considerable pain in LEs (L>R) and did not tolerate being up long at  all  Stairs            Wheelchair Mobility    Modified Rankin (Stroke Patients Only)       Balance Overall balance assessment: Needs assistance   Sitting balance-Leahy Scale: Good       Standing balance-Leahy Scale: Fair Standing balance comment: highly reliant on walker and still staggering at times with minimal ambulation                             Pertinent Vitals/Pain Pain Assessment: (chronic pain from tortuous medical history)    Home Living Family/patient expects to be discharged to:: Unsure Living Arrangements: (girlfriend recently kicked him out)                    Prior Function Level of Independence: Independent         Comments: Pt has use a walker X 2 years after fall off roof.      Hand Dominance        Extremity/Trunk Assessment   Upper Extremity Assessment Upper Extremity Assessment: Generalized weakness(s/p L shld replacement, minimal elevation, generally weak)    Lower Extremity Assessment Lower Extremity Assessment: Generalized weakness(L hip/LE more limited than R, grossly 3- to 3+/5 t/o)       Communication   Communication: No difficulties  Cognition Arousal/Alertness: Awake/alert Behavior During Therapy: WFL for tasks assessed/performed Overall Cognitive Status: Within Functional Limits for tasks assessed  General Comments      Exercises     Assessment/Plan    PT Assessment Patient needs continued PT services  PT Problem List Decreased strength;Decreased range of motion;Decreased activity tolerance;Decreased balance;Decreased mobility;Decreased coordination;Decreased knowledge of use of DME;Decreased safety awareness;Decreased knowledge of precautions;Pain       PT Treatment Interventions DME instruction;Gait training;Therapeutic exercise;Therapeutic activities;Functional mobility training;Stair training;Balance training;Neuromuscular  re-education;Patient/family education    PT Goals (Current goals can be found in the Care Plan section)  Acute Rehab PT Goals Patient Stated Goal: get legs feeling better PT Goal Formulation: With patient Time For Goal Achievement: 11/20/17 Potential to Achieve Goals: Fair    Frequency Min 2X/week   Barriers to discharge        Co-evaluation               AM-PAC PT "6 Clicks" Daily Activity  Outcome Measure Difficulty turning over in bed (including adjusting bedclothes, sheets and blankets)?: None Difficulty moving from lying on back to sitting on the side of the bed? : A Lot Difficulty sitting down on and standing up from a chair with arms (e.g., wheelchair, bedside commode, etc,.)?: Unable Help needed moving to and from a bed to chair (including a wheelchair)?: A Little Help needed walking in hospital room?: A Lot Help needed climbing 3-5 steps with a railing? : A Lot 6 Click Score: 14    End of Session Equipment Utilized During Treatment: Gait belt Activity Tolerance: Patient limited by fatigue;Patient limited by pain Patient left: with bed alarm set;with call bell/phone within reach Nurse Communication: Mobility status PT Visit Diagnosis: Unsteadiness on feet (R26.81);Muscle weakness (generalized) (M62.81);Difficulty in walking, not elsewhere classified (R26.2)    Time: 1610-9604 PT Time Calculation (min) (ACUTE ONLY): 29 min   Charges:   PT Evaluation $PT Eval Low Complexity: 1 Low PT Treatments $Therapeutic Activity: 8-22 mins   PT G Codes:        Malachi Pro, DPT 11/06/2017, 5:16 PM

## 2017-11-06 NOTE — Progress Notes (Signed)
[facility]   PATIENT NAME: Perry Bullock    MR#:  [id number]  DATE OF BIRTH:  [birth year]  SUBJECTIVE:  CHIEF COMPLAINT: Patient is eating better, reporting back pain, girlfriend Angela at bedside  REVIEW OF SYSTEMS:  CONSTITUTIONAL: No fever, fatigue or weakness.  EYES: No blurred or double vision.  EARS, NOSE, AND THROAT: No tinnitus or ear pain.  RESPIRATORY: No cough, shortness of breath, wheezing or hemoptysis.  CARDIOVASCULAR: No chest pain, orthopnea, edema.  GASTROINTESTINAL: No nausea, vomiting, diarrhea or abdominal pain.  GENITOURINARY: No dysuria, hematuria.  ENDOCRINE: No polyuria, nocturia,  HEMATOLOGY: No anemia, easy bruising or bleeding SKIN: No rash or lesion. MUSCULOSKELETAL: No joint pain or arthritis.   NEUROLOGIC: No tingling, numbness, weakness.  PSYCHIATRY: No anxiety.  Feeling depressed  DRUG ALLERGIES:   Allergies  Allergen Reactions  . Ace Inhibitors Swelling    VITALS:  Blood pressure 118/79, pulse 99, temperature 98.7 F (37.1 C), temperature source Oral, resp. rate 16, height 6' (1.829 m), weight 68.4 kg (150 lb 12.8 oz), SpO2 99 %.  PHYSICAL EXAMINATION:  GENERAL:  57 y.o.-year-old patient lying in the bed with no acute distress.  EYES: Pupils equal, round, reactive to light and accommodation. No scleral icterus. Extraocular muscles intact.  HEENT: Head atraumatic, normocephalic. Oropharynx and nasopharynx clear.  NECK:  Supple, no jugular venous distention. No thyroid enlargement, no tenderness.  LUNGS: Normal breath sounds bilaterally, no wheezing, rales,rhonchi or crepitation. No use of accessory muscles of respiration.  CARDIOVASCULAR: S1, S2 normal. No murmurs, rubs, or gallops.  ABDOMEN: Soft, nontender, nondistended. Bowel sounds present. No organomegaly or mass.  EXTREMITIES: Left second toe is erythematous.  Left lower extremities with superficial scabs, no signs of infection, no pedal  edema, cyanosis, or clubbing.  NEUROLOGIC: Cranial nerves II through XII are intact. Muscle strength 5/5 in all extremities. Sensation intact. Gait not checked.  PSYCHIATRIC: The patient is alert and oriented x 3.  SKIN: No obvious rash, lesion, or ulcer.    LABORATORY PANEL:   CBC Recent Labs  Lab 11/05/17 0023  WBC 8.2  HGB 12.6*  HCT 36.9*  PLT 326   ------------------------------------------------------------------------------------------------------------------  Chemistries  Recent Labs  Lab 11/05/17 0023  11/06/17 0333  NA 138  --  139  K 4.1  --  3.5  CL 105  --  109  CO2 28  --  24  GLUCOSE 113*  --  97  BUN 7  --  6  CREATININE 0.62  --  0.70  CALCIUM 7.8*  --  7.4*  MG  --    < > 2.0  AST 35  --   --   ALT 19  --   --   ALKPHOS 145*  --   --   BILITOT 1.1  --   --    < > = values in this interval not displayed.   ------------------------------------------------------------------------------------------------------------------  Cardiac Enzymes Recent Labs  Lab 11/05/17 0023  TROPONINI <0.03   ------------------------------------------------------------------------------------------------------------------  RADIOLOGY:  US Arterial Abi (screening Lower Extremity)  Result Date: 11/05/2017 CLINICAL DATA:  Peripheral vascular disease. Bilateral lower extremity rest pain, claudication. Previous tobacco abuse. EXAM: NONINVASIVE PHYSIOLOGIC VASCULAR STUDY OF BILATERAL LOWER EXTREMITIES TECHNIQUE: Evaluation of both lower extremities were performed at rest, including calculation of ankle-brachial indices with single level Doppler, pressure and pulse volume recording. COMPARISON:  None available FINDINGS: Right ABI:  1.49 Left ABI:  1.54 Right Lower Extremity:  Monophasic arterial waveforms  distally. Left Lower Extremity:  Monophasic arterial waveforms distally. > 1.4 Non diagnostic secondary to incompressible vessel calcifications (medial arterial sclerosis of  [place]) IMPRESSION: 1. Non diagnostic secondary to incompressible vessel calcifications (medial arterial sclerosis of [place]) Electronically Signed   By: Corlis Leak M.D.   On: 11/05/2017 11:28   US Abdomen Limited Ruq  Result Date: 11/05/2017 CLINICAL DATA:  57 year old male with abdominal pain and diarrhea. EXAM: ULTRASOUND ABDOMEN LIMITED RIGHT UPPER QUADRANT COMPARISON:  CT of the abdomen pelvis dated 04/23/2015 FINDINGS: Gallbladder: There are small gallstones. There is no gallbladder wall thickening or pericholecystic fluid. Negative sonographic Murphy's sign. Common bile duct: Diameter: 5 mm Liver: Diffuse increased liver echogenicity consistent with fatty infiltration. Portal vein is patent on color Doppler imaging with normal direction of blood flow towards the liver. IMPRESSION: Cholelithiasis without sonographic evidence of acute cholecystitis. Fatty liver. Electronically Signed   By: Elgie Collard M.D.   On: 11/05/2017 04:08    EKG:   Orders placed or performed during the hospital encounter of 11/05/17  . ED EKG  . ED EKG  . EKG 12-Lead  . EKG 12-Lead    ASSESSMENT AND PLAN:    1.) UTI: Tachycardic, however (-) fever/leukocytosis/tachypnea/hypoxia, SIRS (-). U/A (+) UTI, grossly infected per ED. UCx pending. Ceftriaxone 1g IV qD.  2.) B/L LE erythema/swelling/TTP: Pt w/ redness of B/L LE (below knees). (+) TTP. Tachycardic, however (-) fever/leukocytosis/tachypnea/hypoxia, SIRS (-). Cellulitis vs. PVD. Ceftriaxone as above.  Ordered for ABIs-nondiagnostic secondary to incompressible vessel calcifications  3.) Diarrhea: Denies malodorous, green, explosive, purulent or bloody stools. Denies recent ABx or recent hospitalization. Do not suspect C. difficile. Denies sick contacts. Duration ~1wk, persistent, do not suspect viral diarrhea. Possibly 2/2 UTI, vs. Infectious diarrhea vs. noninfectious diarrhea. Stool Cx/WBC pending. If (-) for infxn, Imodium.  4.) EtOH abuse:  Drinks daily, last drink yesterday. Does not quantify. EtOH < 10. CIWA, thiamine, folate, MVI.  5.) Hypoalbuminemia/malnourishment: Pt w/ self-care deficit, poor PO intake. Albumin 2.1. Prealbumin, Mag, Phos, B12, Folate levels pending. Nutrition consult.  6.)  Acute on chronic low back pain Consult placed to Dr. Rosita Kea back specialist and PT Voltaren gel topical applied and pain medication as needed  7.) Macrocytic anemia: Hgb 12.6, MCV 102.9. B12, Folate levels pending. Low suspicion for active/acute bleeding. Monitor.  8.) COPD/asthma: DuoNebs PRN. C/w Dulera, Spiriva.  9.) GERD: c/w Protonix, Carafate.  10.  Hypomagnesemia probably from alcohol abuse replete and recheck  11.  Depression acute; psych consult placed, seen by Dr. Toni Amend who thinks it is dysphoria.  Trazodone dose was increased to 200 mg at night Seroquel is at maximum dose 300 mg.  Recommending to add mirtazapine at night 15 mg to help with appetite and sleep.  Patient does not meet criteria to be placed inpatient psychiatry floor  12.  Erythema of the left second toe-uric acid at 7.8 but patient denies any pain or tenderness   Disposition: Seems to be homeless at this time, consult social worker  All the records are reviewed and case discussed with Care Management/Social Workerr. Management plans discussed with the patient, he is in agreement CODE STATUS: fc  TOTAL TIME TAKING CARE OF THIS PATIENT: 36 minutes.   POSSIBLE D/C IN 1-2 DAYS, DEPENDING ON CLINICAL CONDITION.  Note: This dictation was prepared with Dragon dictation along with smaller phrase technology. Any transcriptional errors that result from this process are unintentional.   Ramonita Lab M.D on 11/06/2017 at 3:39 PM  Between 7am to  6pm - Pager - [phone] After 6pm go to [email or web address] - password [email or web address] [facility]  [place] [facility]  Office  [phone]  CC: Primary care physician; Center, [facility]

## 2017-11-06 NOTE — Progress Notes (Signed)
Pharmacy Electrolyte Monitoring Consult:  Pharmacy consulted to assist in monitoring and replacing electrolytes in this 57 y.o. male with a h/o PVD, COPD and ETOH abuse admitted on 11/05/2017 with UTI.  Labs:  Sodium (mmol/L)  Date Value  11/06/2017 139  07/13/2014 139   Potassium (mmol/L)  Date Value  11/06/2017 3.5  07/13/2014 3.9   Magnesium (mg/dL)  Date Value  82/95/6213 2.0   Phosphorus (mg/dL)  Date Value  08/65/7846 2.9   Calcium (mg/dL)  Date Value  96/29/5284 7.4 (L)   Calcium, Total (mg/dL)  Date Value  13/24/4010 8.1 (L)   Albumin (g/dL)  Date Value  27/25/3664 2.1 (L)  07/13/2014 3.1 (L)    Assessment/Plan: Electrolytes WNL. Will f/u AM labs.   Luisa Hart D 11/06/2017 12:50 PM

## 2017-11-06 NOTE — Clinical Social Work Note (Signed)
Clinical Social Work Assessment  Patient Details  Name: SHADEED COLBERG MRN: [id number] Date of Birth: 10/01/1960  Date of referral:  11/06/17               Reason for consult:  Facility Placement                Permission sought to share information with:  Chartered certified accountant granted to share information::  Yes, Verbal Permission Granted  Name::      [place]::     Relationship::     Contact Information:     Housing/Transportation Living arrangements for the past 2 months:  [place] of Information:  Patient Patient Interpreter Needed:  None Criminal Activity/Legal Involvement Pertinent to Current Situation/Hospitalization:  No - Comment as needed Significant Relationships:  Significant Other, Parents Lives with:  Self Do you feel safe going back to the place where you live?  No Need for family participation in patient care:  Yes (Comment)  Care giving concerns:  Patient was living with his girlfriend Levada Dy of 98 years in [place] prior to coming into [facility].    Social Worker assessment / plan:  Holiday representative (CSW) received verbal consult from MD that patient needs placement. PT is recommending SNF. CSW met with patient and his girlfriend Levada Dy was at bedside. Per Levada Dy patient drinks alcohol and does not eat or get exercise. Per Levada Dy patient has decompensated and needs SNF placement. Per Levada Dy patient can't go home to their house because the land lord is locking the place up. Per Levada Dy she is a traveling Marine scientist and is going away for work.  CSW met with patient alone at bedside after Levada Dy stepped out of the room. At first patient stated he did not want to go to nursing home however later on in the conversation he agreed to a bed search. CSW made patient aware that under medicaid he will have to sign over his disability check, stay for 30 days at the SNF and likely go outside of [facility]. Per patient  he does receive disability $754 and is willing to sign it over. FL2 complete and faxed out. PASARR is pending. CSW will continue to follow and assist as needed.    Employment status:  Disabled (Comment on whether or not currently receiving Disability) Insurance information:  Medicaid In [place] PT Recommendations:  [place] / Referral to community resources:  [place]  Patient/Family's Response to care:  Patient is agreeable to SNF placement.   Patient/Family's Understanding of and Emotional Response to Diagnosis, Current Treatment, and Prognosis:  Patient was pleasant and thanked CSW for assistance.   Emotional Assessment Appearance:  Appears stated age Attitude/Demeanor/Rapport:    Affect (typically observed):  Accepting, Adaptable, Pleasant Orientation:  Oriented to Self, Oriented to Place, Oriented to  Time, Oriented to Situation Alcohol / Substance use:  Alcohol Use Psych involvement (Current and /or in the community):  Yes (Comment)(Psych is following. )  Discharge Needs  Concerns to be addressed:  Discharge Planning Concerns Readmission within the last 30 days:  No Current discharge risk:  Dependent with Mobility Barriers to Discharge:  Continued Medical Work up   [employer], Veronia Beets, LCSW 11/06/2017, 4:20 PM

## 2017-11-07 DIAGNOSIS — J449 Chronic obstructive pulmonary disease, unspecified: Secondary | ICD-10-CM

## 2017-11-07 DIAGNOSIS — K219 Gastro-esophageal reflux disease without esophagitis: Secondary | ICD-10-CM

## 2017-11-07 DIAGNOSIS — N390 Urinary tract infection, site not specified: Secondary | ICD-10-CM

## 2017-11-07 DIAGNOSIS — F341 Dysthymic disorder: Secondary | ICD-10-CM

## 2017-11-07 LAB — BASIC METABOLIC PANEL
Anion gap: 5 (ref 5–15)
CALCIUM: 7.5 mg/dL — AB (ref 8.9–10.3)
CO2: 25 mmol/L (ref 22–32)
Chloride: 108 mmol/L (ref 101–111)
Creatinine, Ser: 0.53 mg/dL — ABNORMAL LOW (ref 0.61–1.24)
GFR calc Af Amer: >60 mL/min (ref >60)
Glucose, Bld: 103 mg/dL — ABNORMAL HIGH (ref 65–99)
Potassium: 3.7 mmol/L (ref 3.5–5.1)
Sodium: 138 mmol/L (ref 135–145)

## 2017-11-07 LAB — PHOSPHORUS: PHOSPHORUS: 3.3 mg/dL (ref 2.5–4.6)

## 2017-11-07 LAB — MAGNESIUM: MAGNESIUM: 1.5 mg/dL — AB (ref 1.7–2.4)

## 2017-11-07 MED ORDER — PREDNISONE 50 MG PO TABS
50.0000 mg | ORAL_TABLET | Freq: Every day | ORAL | Status: DC
  Administered 2017-11-07 – 2017-11-09 (×3): 50 mg via ORAL
  Filled 2017-11-07 (×3): qty 1

## 2017-11-07 MED ORDER — MAGNESIUM SULFATE 2 GM/50ML IV SOLN
2.0000 g | Freq: Once | INTRAVENOUS | Status: AC
  Administered 2017-11-07: 2 g via INTRAVENOUS
  Filled 2017-11-07: qty 50

## 2017-11-07 MED ORDER — ALLOPURINOL 100 MG PO TABS
100.0000 mg | ORAL_TABLET | Freq: Every day | ORAL | Status: DC
  Administered 2017-11-07 – 2017-11-11 (×5): 100 mg via ORAL
  Filled 2017-11-07 (×5): qty 1

## 2017-11-07 NOTE — Consult Note (Signed)
Patient seen, notes reviewed. Recommend a steroid taper for his low back pain. He may follow-up with me in 7 to 14 days. Detailed note to follow.

## 2017-11-07 NOTE — Clinical Social Work Note (Signed)
CSW was alerted by the attending MD that the patient is refusing SNF. The CSW met with the patient at bedside to discuss safe discharge plans. The patient changed his mind and now wants to continue to pursue SNF. CSW has advised the attending MD of this and requested that a psychiatric consult be entered to determine capacity to make medical decisions and to ensure that his psychiatric medications are appropriate. CSW is continuing to follow.   Martha , MSW, LCSWA [phone] 

## 2017-11-07 NOTE — Progress Notes (Signed)
Pharmacy Electrolyte Monitoring Consult:  Pharmacy consulted to assist in monitoring and replacing electrolytes in this 57 y.o. male with a h/o PVD, COPD and ETOH abuse admitted on 11/05/2017 with UTI.  Labs:  Sodium (mmol/L)  Date Value  11/07/2017 138  07/13/2014 139   Potassium (mmol/L)  Date Value  11/07/2017 3.7  07/13/2014 3.9   Magnesium (mg/dL)  Date Value  14/78/2956 1.5 (L)   Phosphorus (mg/dL)  Date Value  21/30/8657 3.3   Calcium (mg/dL)  Date Value  84/69/6295 7.5 (L)   Calcium, Total (mg/dL)  Date Value  28/41/3244 8.1 (L)   Albumin (g/dL)  Date Value  07/02/7251 2.1 (L)  07/13/2014 3.1 (L)    Assessment/Plan: Will replace with magnesium 2g IV x 1 dose.   Will f/u AM labs and continue to replace electrolytes as needed.   Gardner Candle, PharmD, BCPS Clinical Pharmacist 11/07/2017 7:40 AM

## 2017-11-07 NOTE — Consult Note (Signed)
[facility] Psychiatry Consult   Reason for Consult:  Determination of capacity Referring Physician:  Dr Margaretmary Eddy Patient Identification: Perry Bullock MRN:  [id number] Principal Diagnosis: Dysthymia Diagnosis:   Patient Active Problem List   Diagnosis Date Noted  . Complicated UTI (urinary tract infection) [N39.0] 11/05/2017  . Pressure injury of skin [L89.90] 11/05/2017  . Dysthymia [F34.1] 11/05/2017  . Closed intertrochanteric fracture of hip, right, initial encounter ([place]) [S72.141A] 01/15/2017  . Substance induced mood disorder ([place]) [F19.94] 03/05/2016  . Alcohol abuse [F10.10] 03/05/2016  . Cocaine abuse ([place]) [F14.10] 03/05/2016  . Angioedema [T78.3XXA] 04/22/2015  . Abscess of upper lobe of right lung without pneumonia ([place]) [J85.2] 04/03/2015  . COPD (chronic obstructive pulmonary disease) ([place]) [J44.9] 04/03/2015  . Hep C w/o coma, chronic ([place]) [B18.2] 04/03/2015  . Bipolar disorder ([place]) [F31.9] 04/03/2015    Total Time spent with patient: 45 minutes  Subjective:  Speaking with patient today, he denies any depressive symptoms or depressive cognitions.  Denies any anxiety symptoms.  He is able to tell me that he had cellulitis of his lower extremities and almost escaped amputation of his toes.  Apart from this patient does not seem to know why he is in the hospital or what has caused the other pathophysiological abnormalities that he has.  Tells me that the treating team has not spoken with him about any of this.  Patient is pleasant and endorses that his mood is normal.  HPI Perry Bullock is a 57 y.o. male patient admitted with failure to thrive, confusion and irritability.  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  He notes to have UTI, had cellulitis of lower extremities, in addition to diarrhea.  Also seemed to be malnourished with hypoalbuminemia.  Seen by psychiatry and advised addition of  mirtazapine to regimen, crease in appetite.  Primary team asked for psychiatry reconsult today, for capacity determination and optimization of psychotropic regimen.  When SNF was suggested to the patient, he initially disagreed and after discussion with social worker has agreed to this option.  This prompted psychiatry reconsult for capacity evaluation.  Performing a cognitive exam using the [facility] cognitive assessment scale, patient scores 24 on 30.  Loses points with delayed recall which was 3 out of 5 and serial subtraction which patient could not do.  Visual-spatial tasks which involves copying a cube and Trail making test were not performed correctly.  However the patient has issues with vision.  Past Psychiatric History: She has been previously diagnosed with mood disorder.  Has a history of alcohol use disorder, cocaine use disorder has been on trazodone and Seroquel chronically from his primary care doctor.  Risk to Self: Is patient at risk for suicide?: No Risk to Others:   Prior Inpatient Therapy:   Prior Outpatient Therapy:    Past Medical History:  Past Medical History:  Diagnosis Date  . Anxiety   . Arthritis   . Asthma   . Bipolar 1 disorder ([place])   . Blind right eye   . Chronic back pain    Diverticulosis  . Chronic headaches   . Convulsion ([place])    once a month when stands up too quickly  . COPD (chronic obstructive pulmonary disease) ([place])   . DDD (degenerative disc disease), lumbar   . Diverticulitis   . GERD (gastroesophageal reflux disease)   . Glaucoma   . Hepatitis    hepatitis C  .  Pneumonia   . PVD (peripheral vascular disease) ([facility])    lower extremities reddened and feet swollen  . Seasonal allergies   . Shortness of breath dyspnea     Past Surgical History:  Procedure Laterality Date  . EYE SURGERY Right   . INTRAMEDULLARY (IM) NAIL INTERTROCHANTERIC Right 01/16/2017   Procedure: INTRAMEDULLARY (IM) NAIL INTERTROCHANTRIC;  Surgeon: Corky Mull,  MD;  Location: [facility] ORS;  Service: Orthopedics;  Laterality: Right;  . INTUBATION-ENDOTRACHEAL WITH TRACHEOSTOMY STANDBY N/A 04/22/2015   Procedure: INTUBATION-ENDOTRACHEAL WITH TRACHEOSTOMY STANDBY;  Surgeon: Beverly Gust, MD;  Location: [facility] ORS;  Service: ENT;  Laterality: N/A;  . JOINT REPLACEMENT Left    Lt shoulder  . MANDIBLE SURGERY     Family History:  Family History  Problem Relation Age of Onset  . Breast cancer Mother   . Hypertension Father   . Diabetes Father    Family Psychiatric  History:  Social History:  Social History   Substance and Sexual Activity  Alcohol Use Yes  . Alcohol/week: 3.6 oz  . Types: 6 Cans of beer per week   Comment: occ     Social History   Substance and Sexual Activity  Drug Use No    Social History   Socioeconomic History  . Marital status: Single    Spouse name: Not on file  . Number of children: Not on file  . Years of education: Not on file  . Highest education level: Not on file  Occupational History  . Not on file  Social Needs  . Financial resource strain: Not on file  . Food insecurity:    Worry: Not on file    Inability: Not on file  . Transportation needs:    Medical: Not on file    Non-medical: Not on file  Tobacco Use  . Smoking status: Current Every Day Smoker    Packs/day: 0.00    Years: 35.00    Pack years: 0.00    Types: Cigarettes  . Smokeless tobacco: Never Used  . Tobacco comment: 2-3 cigs daily--02/27/16  Substance and Sexual Activity  . Alcohol use: Yes    Alcohol/week: 3.6 oz    Types: 6 Cans of beer per week    Comment: occ  . Drug use: No  . Sexual activity: Yes    Birth control/protection: None  Lifestyle  . Physical activity:    Days per week: Not on file    Minutes per session: Not on file  . Stress: Not on file  Relationships  . Social connections:    Talks on phone: Not on file    Gets together: Not on file    Attends religious service: Not on file    Active member of club or  organization: Not on file    Attends meetings of clubs or organizations: Not on file    Relationship status: Not on file  Other Topics Concern  . Not on file  Social History Narrative  . Not on file   Additional Social History:    Allergies:   Allergies  Allergen Reactions  . Ace Inhibitors Swelling    Labs:  Results for orders placed or performed during the hospital encounter of 11/05/17 (from the past 48 hour(s))  Basic metabolic panel     Status: Abnormal   Collection Time: 11/06/17  3:33 AM  Result Value Ref Range   Sodium 139 135 - 145 mmol/L   Potassium 3.5 3.5 - 5.1 mmol/L   Chloride 109  101 - 111 mmol/L   CO2 24 22 - 32 mmol/L   Glucose, Bld 97 65 - 99 mg/dL   BUN 6 6 - 20 mg/dL   Creatinine, Ser 0.70 0.61 - 1.24 mg/dL   Calcium 7.4 (L) 8.9 - 10.3 mg/dL   GFR calc non Af Amer >60 >60 mL/min   GFR calc Af Amer >60 >60 mL/min    Comment: (NOTE) The eGFR has been calculated using the CKD EPI equation. This calculation has not been validated in all clinical situations. eGFR's persistently <60 mL/min signify possible Chronic Kidney Disease.    Anion gap 6 5 - 15    Comment: Performed at [facility], [address] [place], [place] [address]  Magnesium     Status: None   Collection Time: 11/06/17  3:33 AM  Result Value Ref Range   Magnesium 2.0 1.7 - 2.4 mg/dL    Comment: Performed at [facility], [address] [place], [place] [address]  Phosphorus     Status: None   Collection Time: 11/06/17  3:33 AM  Result Value Ref Range   Phosphorus 2.9 2.5 - 4.6 mg/dL    Comment: Performed at [facility], [address] [place], [place] [address]  Uric acid     Status: Abnormal   Collection Time: 11/06/17  3:33 AM  Result Value Ref Range   Uric Acid, Serum 7.8 (H) 4.4 - 7.6 mg/dL    Comment: Performed at [facility], [address], [place], [place] [address]  Magnesium     Status: Abnormal   Collection Time: 11/07/17   4:37 AM  Result Value Ref Range   Magnesium 1.5 (L) 1.7 - 2.4 mg/dL    Comment: Performed at [facility], [address] [place], [place] [address]  Phosphorus     Status: None   Collection Time: 11/07/17  4:37 AM  Result Value Ref Range   Phosphorus 3.3 2.5 - 4.6 mg/dL    Comment: Performed at [facility], [address] [place], [place] [address]  Basic metabolic panel     Status: Abnormal   Collection Time: 11/07/17  4:37 AM  Result Value Ref Range   Sodium 138 135 - 145 mmol/L   Potassium 3.7 3.5 - 5.1 mmol/L   Chloride 108 101 - 111 mmol/L   CO2 25 22 - 32 mmol/L   Glucose, Bld 103 (H) 65 - 99 mg/dL   BUN <5 (L) 6 - 20 mg/dL   Creatinine, Ser 0.53 (L) 0.61 - 1.24 mg/dL   Calcium 7.5 (L) 8.9 - 10.3 mg/dL   GFR calc non Af Amer >60 >60 mL/min   GFR calc Af Amer >60 >60 mL/min    Comment: (NOTE) The eGFR has been calculated using the CKD EPI equation. This calculation has not been validated in all clinical situations. eGFR's persistently <60 mL/min signify possible Chronic Kidney Disease.    Anion gap 5 5 - 15    Comment: Performed at [facility], [address] [place], [place] [address]    Current Facility-Administered Medications  Medication Dose Route Frequency Provider Last Rate Last Dose  . albuterol (PROVENTIL) (2.5 MG/3ML) 0.083% nebulizer solution 2.5 mg  2.5 mg Nebulization Q6H PRN Arta Silence, MD      . allopurinol (ZYLOPRIM) tablet 100 mg  100 mg Oral Daily Gouru, Aruna, MD   100 mg at 11/07/17 1122  . bisacodyl (DULCOLAX) EC tablet 5 mg  5 mg  Oral Daily PRN Arta Silence, MD      . diclofenac sodium (VOLTAREN) 1 % transdermal gel 2 g  2 g Topical QID Gouru, Aruna, MD   2 g at 11/07/17 1606  . enoxaparin (LOVENOX) injection 40 mg  40 mg Subcutaneous Q24H Arta Silence, MD   40 mg at 11/07/17 0920  . feeding supplement (ENSURE ENLIVE) (ENSURE ENLIVE) liquid 237 mL  237 mL Oral TID BM Gouru, Aruna, MD   237 mL  at 11/07/17 1306  . fluticasone furoate-vilanterol (BREO ELLIPTA) 200-25 MCG/INH 1 puff  1 puff Inhalation Daily Arta Silence, MD   1 puff at 11/07/17 0919  . folic acid (FOLVITE) tablet 1 mg  1 mg Oral Daily Arta Silence, MD   1 mg at 11/07/17 0921  . gabapentin (NEURONTIN) capsule 900 mg  900 mg Oral TID Arta Silence, MD   900 mg at 11/07/17 1606  . latanoprost (XALATAN) 0.005 % ophthalmic solution 1 drop  1 drop Both Eyes QHS Arta Silence, MD   1 drop at 11/06/17 2104  . LORazepam (ATIVAN) tablet 1 mg  1 mg Oral Q6H PRN Arta Silence, MD       Or  . LORazepam (ATIVAN) injection 1 mg  1 mg Intravenous Q6H PRN Arta Silence, MD      . mirtazapine (REMERON) tablet 15 mg  15 mg Oral QHS Clapacs, Madie Reno, MD   15 mg at 11/06/17 2103  . morphine (MS CONTIN) 12 hr tablet 15 mg  15 mg Oral Q12H Gouru, Aruna, MD   15 mg at 11/07/17 6314  . multivitamin with minerals tablet 1 tablet  1 tablet Oral Daily Arta Silence, MD   1 tablet at 11/07/17 0920  . multivitamin-lutein (OCUVITE-LUTEIN) capsule 1 capsule  1 capsule Oral Daily Gouru, Aruna, MD   1 capsule at 11/07/17 0920  . ondansetron (ZOFRAN) tablet 4 mg  4 mg Oral Q6H PRN Arta Silence, MD   4 mg at 11/07/17 0921   Or  . ondansetron (ZOFRAN) injection 4 mg  4 mg Intravenous Q6H PRN Arta Silence, MD      . oxyCODONE (Oxy IR/ROXICODONE) immediate release tablet 5-10 mg  5-10 mg Oral Q4H PRN Arta Silence, MD   5 mg at 11/07/17 1606  . pantoprazole (PROTONIX) EC tablet 40 mg  40 mg Oral Daily Arta Silence, MD   40 mg at 11/07/17 0921  . polyvinyl alcohol (LIQUIFILM TEARS) 1.4 % ophthalmic solution 1 drop  1 drop Both Eyes QID Gouru, Aruna, MD   1 drop at 11/07/17 1606  . predniSONE (DELTASONE) tablet 50 mg  50 mg Oral Q breakfast Gouru, Aruna, MD   50 mg at 11/07/17 1122  . QUEtiapine (SEROQUEL) tablet 300 mg  300 mg Oral QHS Arta Silence, MD   300 mg at 11/06/17 2103  .  senna-docusate (Senokot-S) tablet 1 tablet  1 tablet Oral QHS PRN Arta Silence, MD      . sucralfate (CARAFATE) tablet 1 g  1 g Oral TID WC & HS Arta Silence, MD   1 g at 11/07/17 1606  . tamsulosin (FLOMAX) capsule 0.4 mg  0.4 mg Oral Daily Arta Silence, MD   0.4 mg at 11/07/17 0921  . thiamine (VITAMIN B-1) tablet 100 mg  100 mg Oral Daily Arta Silence, MD   100 mg at 11/07/17 0920  . tiotropium (SPIRIVA) inhalation capsule 18 mcg  18 mcg Inhalation Daily Arta Silence, MD   18 mcg at 11/07/17 0919  .  traZODone (DESYREL) tablet 200 mg  200 mg Oral QHS Clapacs, John T, MD   200 mg at 11/06/17 2103  . zolpidem (AMBIEN) tablet 10 mg  10 mg Oral QHS PRN Arta Silence, MD        Musculoskeletal: Strength & Muscle Tone: Not tested Gait & Station: unable to stand Patient leans: N/A  Psychiatric Specialty Exam: Physical Exam  ROS  Blood pressure 121/88, pulse 97, temperature 98 F (36.7 C), temperature source Oral, resp. rate 18, height 6' (1.829 m), weight 150 lb 12.8 oz (68.4 kg), SpO2 97 %.Body mass index is 20.45 kg/m.  General Appearance: Fairly Groomed  Eye Contact:  Good  Speech:  Normal Rate  Volume:  Normal  Mood:  Euthymic  Affect:  Appropriate  Thought Process:  Goal Directed  Orientation:  Full (Time, Place, and Person)  Thought Content:  Logical  Suicidal Thoughts:  No  Homicidal Thoughts:  No  Memory:  Impaired  Judgement:  Impaired  Insight:  Lacking  Psychomotor Activity:  Normal  AIMS (if indicated):     Assets:  Communication Skills Resilience  ADL's:  Intact  Cognition:  Impaired,  Mild  Sleep:      Patient is a 57 year old male gentleman who presented with failure to thrive, and multiple medical comorbid conditions.  Has had a chronic history of alcohol use, cocaine use and hepatitis C.  Previous CT brain done in 2018 showed enlarged ventricles with suggestive generalized atrophy of brain.  Speaking with patient, he is  able to communicate to me partially the reason for his hospitalization, however fails to communicate to me the entirety of his medical condition, his comorbid medical conditions or the consequences of not taking appropriate care of his comorbid medical conditions.  [place] cognitive assessment scale reveals a score of 24 on 30, with impaired delayed recall.  There is some degree of cognitive decline which although mild seems to be interfering with patient's functional performance.  At this point it does seem like patient does not have the capacity to make medical decisions.  Capacity is a dynamic entity and can change over the course of time.  Differentials for patient's cognitive impairment could include major neurocognitive disorder.  Would need work-up to rule out reversible causes for cognitive decline.  Treatment Plan Summary: Daily contact with patient to assess and evaluate symptoms and progress in treatment and Medication management  Disposition: Patient does not meet criteria for psychiatric inpatient admission.  Ramond Dial, MD 11/07/2017 6:03 PM

## 2017-11-07 NOTE — Progress Notes (Signed)
[facility] -  at [facility]   PATIENT NAME: Perry Bullock    MR#:  [id number]  DATE OF BIRTH:  Apr 16, 1961  SUBJECTIVE:  CHIEF COMPLAINT: Patient is eating better, reporting back pain, and left leg toe pain he was on allopurinol which was discontinued  REVIEW OF SYSTEMS:  CONSTITUTIONAL: No fever, fatigue or weakness.  EYES: No blurred or double vision.  EARS, NOSE, AND THROAT: No tinnitus or ear pain.  RESPIRATORY: No cough, shortness of breath, wheezing or hemoptysis.  CARDIOVASCULAR: No chest pain, orthopnea, edema.  GASTROINTESTINAL: No nausea, vomiting, diarrhea or abdominal pain.  GENITOURINARY: No dysuria, hematuria.  ENDOCRINE: No polyuria, nocturia,  HEMATOLOGY: No anemia, easy bruising or bleeding SKIN: No rash or lesion. MUSCULOSKELETAL: No joint pain or arthritis.   NEUROLOGIC: No tingling, numbness, weakness.  PSYCHIATRY: No anxiety.  Feeling depressed  DRUG ALLERGIES:   Allergies  Allergen Reactions  . Ace Inhibitors Swelling    VITALS:  Blood pressure 121/88, pulse 97, temperature 98 F (36.7 C), temperature source Oral, resp. rate 18, height 6' (1.829 m), weight 68.4 kg (150 lb 12.8 oz), SpO2 97 %.  PHYSICAL EXAMINATION:  GENERAL:  57 y.o.-year-old patient lying in the bed with no acute distress.  EYES: Pupils equal, round, reactive to light and accommodation. No scleral icterus. Extraocular muscles intact.  HEENT: Head atraumatic, normocephalic. Oropharynx and nasopharynx clear.  NECK:  Supple, no jugular venous distention. No thyroid enlargement, no tenderness.  LUNGS: Normal breath sounds bilaterally, no wheezing, rales,rhonchi or crepitation. No use of accessory muscles of respiration.  CARDIOVASCULAR: S1, S2 normal. No murmurs, rubs, or gallops.  ABDOMEN: Soft, nontender, nondistended. Bowel sounds present. No organomegaly or mass.  EXTREMITIES: Left second toe is erythematous.  Left lower extremities with superficial  scabs, no signs of infection, no pedal edema, cyanosis, or clubbing.  NEUROLOGIC: Cranial nerves II through XII are intact. Muscle strength 5/5 in all extremities. Sensation intact. Gait not checked.  PSYCHIATRIC: The patient is alert and oriented x 3.  SKIN: No obvious rash, lesion, or ulcer.    LABORATORY PANEL:   CBC Recent Labs  Lab 11/05/17 0023  WBC 8.2  HGB 12.6*  HCT 36.9*  PLT 326   ------------------------------------------------------------------------------------------------------------------  Chemistries  Recent Labs  Lab 11/05/17 0023  11/07/17 0437  NA 138   < > 138  K 4.1   < > 3.7  CL 105   < > 108  CO2 28   < > 25  GLUCOSE 113*   < > 103*  BUN 7   < > <5*  CREATININE 0.62   < > 0.53*  CALCIUM 7.8*   < > 7.5*  MG  --    < > 1.5*  AST 35  --   --   ALT 19  --   --   ALKPHOS 145*  --   --   BILITOT 1.1  --   --    < > = values in this interval not displayed.   ------------------------------------------------------------------------------------------------------------------  Cardiac Enzymes Recent Labs  Lab 11/05/17 0023  TROPONINI <0.03   ------------------------------------------------------------------------------------------------------------------  RADIOLOGY:  Dg Lumbar Spine 2-3 Views  Result Date: 11/06/2017 CLINICAL DATA:  Continued low back pain since falling off a roof 2 years ago. EXAM: LUMBAR SPINE - 2-3 VIEW COMPARISON:  Abdomen CT dated 04/23/2015. FINDINGS: Five non-rib-bearing lumbar vertebrae. Marked disc space narrowing and discogenic sclerosis are again demonstrated at the L4-5 level with progressive anterolisthesis at that level. Mild  anterior and posterior spur formation at multiple levels of the lumbar lower thoracic spine. Stable 30% T12 superior endplate compression deformity with no acute fracture lines. Interval 50% T10 vertebral compression deformity with no acute fracture lines visualized. No significant bony retropulsion  visualized. Atheromatous arterial calcifications. IMPRESSION: 1. Marked degenerative changes at the L4-5 level with progressive anterolisthesis at that level, currently grade 1-2. 2. Mild lumbar and lower thoracic spine degenerative changes. 3. Stable old T12 compression deformity and interval old T10 compression deformity. Electronically Signed   By: Beckie Salts M.D.   On: 11/06/2017 20:46    EKG:   Orders placed or performed during the hospital encounter of 11/05/17  . ED EKG  . ED EKG  . EKG 12-Lead  . EKG 12-Lead    ASSESSMENT AND PLAN:    1.) UTI: Tachycardic, however (-) fever/leukocytosis/tachypnea/hypoxia, SIRS (-). U/A (+) UTI, grossly infected per ED. UCx pending. Ceftriaxone 1g IV qD.  2.) B/L LE erythema/swelling/TTP: Pt w/ redness of B/L LE (below knees). (+) TTP. Tachycardic, however (-) fever/leukocytosis/tachypnea/hypoxia, SIRS (-). Cellulitis vs. PVD. Ceftriaxone as above.  Ordered for ABIs-nondiagnostic secondary to incompressible vessel calcifications  3.) Diarrhea: Denies malodorous, green, explosive, purulent or bloody stools. Denies recent ABx or recent hospitalization. Do not suspect C. difficile. Denies sick contacts. Duration ~1wk, persistent, do not suspect viral diarrhea. Possibly 2/2 UTI, vs. Infectious diarrhea vs. noninfectious diarrhea. Stool Cx/WBC pending. If (-) for infxn, Imodium.  4.) EtOH abuse: Drinks daily, last drink yesterday. Does not quantify. EtOH < 10. CIWA, thiamine, folate, MVI.  5.) Hypoalbuminemia/malnourishment: Pt w/ self-care deficit, poor PO intake. Albumin 2.1. Prealbumin, Mag, Phos, B12, Folate levels pending. Nutrition consult.  6.)  Acute on chronic low back pain  seen by orthopedics Dr. Odis Luster recommended prednisone tapering dose and outpatient follow-up in a week after discharge for steroid injections Voltaren gel topical applied and pain medication as needed  7.) Macrocytic anemia: Hgb 12.6, MCV 102.9. B12, Folate levels  pending. Low suspicion for active/acute bleeding. Monitor.  8.) COPD/asthma: DuoNebs PRN. C/w Dulera, Spiriva.  9.) GERD: c/w Protonix, Carafate.  10.  Hypomagnesemia probably from alcohol abuse replete and recheck  11.  Depression acute; psych consult placed, seen by Dr. Toni Amend who thinks it is dysphoria.  Trazodone dose was increased to 200 mg at night Seroquel is at maximum dose 300 mg.  Recommending to add mirtazapine at night 15 mg to help with appetite and sleep.  Patient does not meet criteria to be placed inpatient psychiatry floor  12.  Erythema of the left second toe-uric acid at 7.8 -mild gout flare Resumed allopurinol and started patient on prednisone taper  Disposition: Seems to be homeless at this time, consult social worker, recommending skilled nursing facility discharge for safety  All the records are reviewed and case discussed with Care Management/Social Workerr. Management plans discussed with the patient, he is in agreement CODE STATUS: fc  TOTAL TIME TAKING CARE OF THIS PATIENT: 36 minutes.   POSSIBLE D/C IN 1-2 DAYS, DEPENDING ON CLINICAL CONDITION.  Note: This dictation was prepared with Dragon dictation along with smaller phrase technology. Any transcriptional errors that result from this process are unintentional.   Ramonita Lab M.D on 11/07/2017 at 4:17 PM  Between 7am to 6pm - Pager - [phone] After 6pm go to [email or web address] - password [email or web address] [facility]  [facility]  Office  [phone]  CC: Primary care physician; Center, [facility]

## 2017-11-08 LAB — BASIC METABOLIC PANEL
ANION GAP: 2 — AB (ref 5–15)
BUN: 11 mg/dL (ref 6–20)
CO2: 31 mmol/L (ref 22–32)
Calcium: 7.8 mg/dL — ABNORMAL LOW (ref 8.9–10.3)
Chloride: 105 mmol/L (ref 101–111)
Creatinine, Ser: 0.57 mg/dL — ABNORMAL LOW (ref 0.61–1.24)
Glucose, Bld: 151 mg/dL — ABNORMAL HIGH (ref 65–99)
Potassium: 4.2 mmol/L (ref 3.5–5.1)
Sodium: 138 mmol/L (ref 135–145)

## 2017-11-08 LAB — MAGNESIUM: Magnesium: 1.8 mg/dL (ref 1.7–2.4)

## 2017-11-08 NOTE — Progress Notes (Addendum)
[facility] - [place] at [facility]   PATIENT NAME: Perry Bullock    MR#:  [id number]  DATE OF BIRTH:  Aug 17, 1960  SUBJECTIVE:  she is sitting at the edge of the bed eating breakfast. Complains of bilateral lower extremity rest pain. He does have claudication. History of long-standing smoking.  REVIEW OF SYSTEMS:   Review of Systems  Constitutional: Negative for chills, fever and weight loss.  HENT: Negative for ear discharge, ear pain and nosebleeds.   Eyes: Negative for blurred vision, pain and discharge.  Respiratory: Negative for sputum production, shortness of breath, wheezing and stridor.   Cardiovascular: Negative for chest pain, palpitations, orthopnea and PND.  Gastrointestinal: Negative for abdominal pain, diarrhea, nausea and vomiting.  Genitourinary: Negative for frequency and urgency.  Musculoskeletal: Negative for back pain and joint pain.       Bilateral lower extremity leg pain  Neurological: Negative for sensory change, speech change, focal weakness and weakness.  Psychiatric/Behavioral: Negative for depression and hallucinations. The patient is not nervous/anxious.    Tolerating Diet: yes Tolerating PT: rehab  DRUG ALLERGIES:   Allergies  Allergen Reactions  . Ace Inhibitors Swelling    VITALS:  Blood pressure 101/74, pulse (!) 117, temperature 99.5 F (37.5 C), temperature source Oral, resp. rate 18, height 6' (1.829 m), weight 68.4 kg (150 lb 12.8 oz), SpO2 96 %.  PHYSICAL EXAMINATION:   Physical Exam  GENERAL:  57 y.o.-year-old patient lying in the bed with no acute distress.  EYES: Pupils equal, round, reactive to light and accommodation. No scleral icterus. Extraocular muscles intact.  HEENT: Head atraumatic, normocephalic. Oropharynx and nasopharynx clear.  NECK:  Supple, no jugular venous distention. No thyroid enlargement, no tenderness.  LUNGS: Normal breath sounds bilaterally, no wheezing, rales, rhonchi. No use of  accessory muscles of respiration.  CARDIOVASCULAR: S1, S2 normal. No murmurs, rubs, or gallops.  ABDOMEN: Soft, nontender, nondistended. Bowel sounds present. No organomegaly or mass.  EXTREMITIES: bilateral lower extremity dusky hue iin both LE with skin breakdown in wbe space and some edema NEUROLOGIC: Cranial nerves II through XII are intact. No focal Motor or sensory deficits b/l.   PSYCHIATRIC:  patient is alert and awake SKIN: No obvious rash, lesion, or ulcer.   LABORATORY PANEL:  CBC Recent Labs  Lab 11/05/17 0023  WBC 8.2  HGB 12.6*  HCT 36.9*  PLT 326    Chemistries  Recent Labs  Lab 11/05/17 0023  11/08/17 0510  NA 138   < > 138  K 4.1   < > 4.2  CL 105   < > 105  CO2 28   < > 31  GLUCOSE 113*   < > 151*  BUN 7   < > 11  CREATININE 0.62   < > 0.57*  CALCIUM 7.8*   < > 7.8*  MG  --    < > 1.8  AST 35  --   --   ALT 19  --   --   ALKPHOS 145*  --   --   BILITOT 1.1  --   --    < > = values in this interval not displayed.   Cardiac Enzymes Recent Labs  Lab 11/05/17 0023  TROPONINI <0.03   RADIOLOGY:  Dg Lumbar Spine 2-3 Views  Result Date: 11/06/2017 CLINICAL DATA:  Continued low back pain since falling off a roof 2 years ago. EXAM: LUMBAR SPINE - 2-3 VIEW COMPARISON:  Abdomen CT dated 04/23/2015. FINDINGS: Five  non-rib-bearing lumbar vertebrae. Marked disc space narrowing and discogenic sclerosis are again demonstrated at the L4-5 level with progressive anterolisthesis at that level. Mild anterior and posterior spur formation at multiple levels of the lumbar lower thoracic spine. Stable 30% T12 superior endplate compression deformity with no acute fracture lines. Interval 50% T10 vertebral compression deformity with no acute fracture lines visualized. No significant bony retropulsion visualized. Atheromatous arterial calcifications. IMPRESSION: 1. Marked degenerative changes at the L4-5 level with progressive anterolisthesis at that level, currently grade 1-2.  2. Mild lumbar and lower thoracic spine degenerative changes. 3. Stable old T12 compression deformity and interval old T10 compression deformity. Electronically Signed   By: Beckie Salts M.D.   On: 11/06/2017 20:46   ASSESSMENT AND PLAN:    1.Acute on chronic low back pain  seen by orthopedics Dr. Odis Luster recommended prednisone tapering dose and outpatient follow-up in a week after discharge for steroid injections Voltaren gel topical applied and pain medication as needed  2.UTI: Tachycardic U/A (+) UTI, grossly infected per ED. UCx pending. Ceftriaxone 1g IV qD  3.EtOH abuse: Drinks daily, last drink yesterday. Does not quantify. EtOH < 10. CIWA, thiamine, folate, MVI. -scoring 0 at present   4.COPD/asthma: DuoNebs PRN. C/w Dulera, Spiriva.  5.B/L LE erythema/swelling: Pt w/ dusky redness of B/L LE (below knees). ?PVD - ABIs-nondiagnostic secondary to incompressible vessel calcifications -vascular consutl for claudication and rest pain (no one on call today) -patient has long standing history of smoking.  6.Depression acute; psych consult placed, seen by Dr. Toni Amend who thinks it is dysphoria.   -Trazodone dose was increased to 200 mg at night Seroquel is at maximum dose 300 mg. - Recommending to add mirtazapine at night 15 mg to help with appetite and sleep. -  Patient does not meet criteria to be placed inpatient psychiatry floor -re-eval by Dr rakesh--pt at present not able to make medical decisions  7.d/c planning Seems to be homeless at this time, consult social worker, recommending skilled nursing facility discharge for safety   Case discussed with Care Management/Social Worker. Management plans discussed with the patient, family and they are in agreement.  CODE STATUS: full  DVT Prophylaxis: lovenox  TOTAL TIME TAKING CARE OF THIS PATIENT: 30 minutes.  >50% time spent on counselling and coordination of care  POSSIBLE D/C IN ?? DAYS, DEPENDING ON CLINICAL  CONDITION.  Note: This dictation was prepared with Dragon dictation along with smaller phrase technology. Any transcriptional errors that result from this process are unintentional.  Enedina Finner M.D on 11/08/2017 at 9:32 AM  Between 7am to 6pm - Pager - [phone]  After 6pm go to [email or web address] - Social research officer, government  [facility]  Office  [phone]  CC: Primary care physician; [facility]: [facility] Bull[facility], male   DOB: 1961/04/18, 57 y.o.   MRN: [id number]

## 2017-11-08 NOTE — Consult Note (Signed)
ORTHOPAEDIC CONSULTATION  REQUESTING PHYSICIAN: Ramonita Lab, MD  Chief Complaint: low back pain  HPI: Perry Bullock is a 57 y.o. male who complains of increased low back pain. He has chronic low back pain and had a prior MRI. He denies any bowel or bladder symptoms. Please see H&P and ED notes for details.  Past Medical History:  Diagnosis Date  . Anxiety   . Arthritis   . Asthma   . Bipolar 1 disorder (HCC)   . Blind right eye   . Chronic back pain    Diverticulosis  . Chronic headaches   . Convulsion (HCC)    once a month when stands up too quickly  . COPD (chronic obstructive pulmonary disease) (HCC)   . DDD (degenerative disc disease), lumbar   . Diverticulitis   . GERD (gastroesophageal reflux disease)   . Glaucoma   . Hepatitis    hepatitis C  . Pneumonia   . PVD (peripheral vascular disease) (HCC)    lower extremities reddened and feet swollen  . Seasonal allergies   . Shortness of breath dyspnea    Past Surgical History:  Procedure Laterality Date  . EYE SURGERY Right   . INTRAMEDULLARY (IM) NAIL INTERTROCHANTERIC Right 01/16/2017   Procedure: INTRAMEDULLARY (IM) NAIL INTERTROCHANTRIC;  Surgeon: Christena Flake, MD;  Location: [facility] ORS;  Service: Orthopedics;  Laterality: Right;  . INTUBATION-ENDOTRACHEAL WITH TRACHEOSTOMY STANDBY N/A 04/22/2015   Procedure: INTUBATION-ENDOTRACHEAL WITH TRACHEOSTOMY STANDBY;  Surgeon: Linus Salmons, MD;  Location: [facility] ORS;  Service: ENT;  Laterality: N/A;  . JOINT REPLACEMENT Left    Lt shoulder  . MANDIBLE SURGERY     Social History   Socioeconomic History  . Marital status: Single    Spouse name: Not on file  . Number of children: Not on file  . Years of education: Not on file  . Highest education level: Not on file  Occupational History  . Not on file  Social Needs  . Financial resource strain: Not on file  . Food insecurity:    Worry: Not on file    Inability: Not on file  . Transportation needs:   Medical: Not on file    Non-medical: Not on file  Tobacco Use  . Smoking status: Current Every Day Smoker    Packs/day: 0.00    Years: 35.00    Pack years: 0.00    Types: Cigarettes  . Smokeless tobacco: Never Used  . Tobacco comment: 2-3 cigs daily--02/27/16  Substance and Sexual Activity  . Alcohol use: Yes    Alcohol/week: 3.6 oz    Types: 6 Cans of beer per week    Comment: occ  . Drug use: No  . Sexual activity: Yes    Birth control/protection: None  Lifestyle  . Physical activity:    Days per week: Not on file    Minutes per session: Not on file  . Stress: Not on file  Relationships  . Social connections:    Talks on phone: Not on file    Gets together: Not on file    Attends religious service: Not on file    Active member of club or organization: Not on file    Attends meetings of clubs or organizations: Not on file    Relationship status: Not on file  Other Topics Concern  . Not on file  Social History Narrative  . Not on file   Family History  Problem Relation Age of Onset  . Breast cancer Mother   .  Hypertension Father   . Diabetes Father    Allergies  Allergen Reactions  . Ace Inhibitors Swelling   Prior to Admission medications   Medication Sig Start Date End Date Taking? Authorizing Provider  albuterol (PROAIR HFA) 108 (90 Base) MCG/ACT inhaler Inhale 2 puffs into the lungs every 6 (six) hours as needed for wheezing or shortness of breath. 02/27/16  Yes Erin Fulling, MD  cetirizine (ZYRTEC) 10 MG tablet Take 10 mg by mouth daily.   Yes [provider]  EMBEDA 30-1.2 MG CPCR Take 1 capsule by mouth daily. 10/23/17  Yes [provider]  fluticasone (FLONASE) 50 MCG/ACT nasal spray Place 2 sprays into both nostrils as needed.    Yes [provider]  gabapentin (NEURONTIN) 600 MG tablet Take 600 mg by mouth 3 (three) times daily.    Yes [provider]  mometasone-formoterol (DULERA) 200-5 MCG/ACT AERO Inhale 2 puffs into  the lungs 2 (two) times daily. 02/27/16  Yes Kasa, Wallis Bamberg, MD  ondansetron (ZOFRAN) 4 MG tablet Take 1 tablet (4 mg total) by mouth every 8 (eight) hours as needed for nausea. 04/06/15  Yes Enedina Finner, MD  oxyCODONE (OXY IR/ROXICODONE) 5 MG immediate release tablet Take 1-2 tablets (5-10 mg total) by mouth every 4 (four) hours as needed for moderate pain. 01/18/17  Yes Evon Slack, PA-C  QUEtiapine (SEROQUEL) 200 MG tablet Take 200 mg by mouth at bedtime.    Yes [provider]  traZODone (DESYREL) 100 MG tablet Take 300 mg by mouth at bedtime.    Yes [provider]  zolpidem (AMBIEN) 10 MG tablet Take 10 mg by mouth at bedtime as needed for sleep.   Yes [provider]  albuterol (PROVENTIL) (2.5 MG/3ML) 0.083% nebulizer solution Take 3 mLs (2.5 mg total) by nebulization every 6 (six) hours as needed for wheezing or shortness of breath. 12/25/15   Shane Crutch, MD  Carboxymethylcellulose Sodium (THERATEARS) 0.25 % SOLN Apply 2 drops to eye 4 (four) times daily as needed.     [provider]  enoxaparin (LOVENOX) 40 MG/0.4ML injection Inject 0.4 mLs (40 mg total) into the skin daily. Patient not taking: Reported on 11/05/2017 01/18/17 02/01/17  Evon Slack, PA-C  morphine (MS CONTIN) 30 MG 12 hr tablet Take 1 tablet (30 mg total) by mouth daily. Patient not taking: Reported on 11/05/2017 01/20/17   Enedina Finner, MD  nicotine (NICODERM CQ - DOSED IN MG/24 HOURS) 21 mg/24hr patch Place 1 patch (21 mg total) onto the skin daily. Patient not taking: Reported on 11/05/2017 11/22/15   Shane Crutch, MD  tamsulosin (FLOMAX) 0.4 MG CAPS capsule Take 1 capsule (0.4 mg total) by mouth daily. Patient not taking: Reported on 11/05/2017 08/17/17   Riki Altes, MD  tiotropium (SPIRIVA) 18 MCG inhalation capsule Place 1 capsule (18 mcg total) into inhaler and inhale daily. Patient not taking: Reported on 11/05/2017 02/27/16   Erin Fulling, MD   Dg Lumbar Spine 2-3  Views  Result Date: 11/06/2017 CLINICAL DATA:  Continued low back pain since falling off a roof 2 years ago. EXAM: LUMBAR SPINE - 2-3 VIEW COMPARISON:  Abdomen CT dated 04/23/2015. FINDINGS: Five non-rib-bearing lumbar vertebrae. Marked disc space narrowing and discogenic sclerosis are again demonstrated at the L4-5 level with progressive anterolisthesis at that level. Mild anterior and posterior spur formation at multiple levels of the lumbar lower thoracic spine. Stable 30% T12 superior endplate compression deformity with no acute fracture lines. Interval 50% T10  vertebral compression deformity with no acute fracture lines visualized. No significant bony retropulsion visualized. Atheromatous arterial calcifications. IMPRESSION: 1. Marked degenerative changes at the L4-5 level with progressive anterolisthesis at that level, currently grade 1-2. 2. Mild lumbar and lower thoracic spine degenerative changes. 3. Stable old T12 compression deformity and interval old T10 compression deformity. Electronically Signed   By: Beckie Salts M.D.   On: 11/06/2017 20:46    Positive ROS: All other systems have been reviewed and were otherwise negative with the exception of those mentioned in the HPI and as above.  Physical Exam: GENERAL:  57 y.o.-year-old patient lying in the bed with no acute distress.  EYES: Pupils equal, round, reactive to light and accommodation. No scleral icterus. Extraocular muscles intact.  HEENT: Head atraumatic, normocephalic. Oropharynx and nasopharynx clear.  NECK:  Supple, no jugular venous distention. No thyroid enlargement, no tenderness.  LUNGS: Normal breath sounds bilaterally, no wheezing, rales, rhonchi. No use of accessory muscles of respiration.  CARDIOVASCULAR: S1, S2 normal. No murmurs, rubs, or gallops.  ABDOMEN: Soft, nontender, nondistended. Bowel sounds present. No organomegaly or mass.    MUSCULOSKELETAL: positive dural tension signs, motor is intact distally,  paraspinal tenderness, pitting edema Venous stasis changes  Assessment: Acute on chronic low back pain  Plan: He may benefit from a steroid taper and physical therapy. He may follow-up in our office as an outpatient for evaluation and possible lumber epidural steroid injections. Please call with any questions.    Lyndle Herrlich, MD    11/08/2017 10:34 AM

## 2017-11-08 NOTE — Progress Notes (Signed)
Pharmacy Electrolyte Monitoring Consult:  Pharmacy consulted to assist in monitoring and replacing electrolytes in this 57 y.o. male with a h/o PVD, COPD and ETOH abuse admitted on 11/05/2017 with UTI.  5/12 K =4.2, Mag = 1.8  Labs:  Sodium (mmol/L)  Date Value  11/08/2017 138  07/13/2014 139   Potassium (mmol/L)  Date Value  11/08/2017 4.2  07/13/2014 3.9   Magnesium (mg/dL)  Date Value  13/01/6577 1.8   Phosphorus (mg/dL)  Date Value  46/96/2952 3.3   Calcium (mg/dL)  Date Value  84/13/2440 7.8 (L)   Calcium, Total (mg/dL)  Date Value  04/26/2535 8.1 (L)   Albumin (g/dL)  Date Value  64/40/3474 2.1 (L)  07/13/2014 3.1 (L)    Assessment/Plan: Electrolytes WNL, no additional supplementation is needed.   Will f/u AM labs and continue to replace electrolytes as needed.   Clovia Cuff, PharmD, BCPS 11/08/2017 9:29 AM

## 2017-11-09 ENCOUNTER — Encounter: Payer: Self-pay | Admitting: Radiology

## 2017-11-09 ENCOUNTER — Inpatient Hospital Stay: Payer: Medicaid Other

## 2017-11-09 DIAGNOSIS — I70212 Atherosclerosis of native arteries of extremities with intermittent claudication, left leg: Secondary | ICD-10-CM

## 2017-11-09 DIAGNOSIS — M79605 Pain in left leg: Secondary | ICD-10-CM

## 2017-11-09 DIAGNOSIS — R600 Localized edema: Secondary | ICD-10-CM

## 2017-11-09 LAB — MAGNESIUM: Magnesium: 1.8 mg/dL (ref 1.7–2.4)

## 2017-11-09 LAB — BASIC METABOLIC PANEL
Anion gap: 3 — ABNORMAL LOW (ref 5–15)
BUN: 14 mg/dL (ref 6–20)
CALCIUM: 7.8 mg/dL — AB (ref 8.9–10.3)
CHLORIDE: 102 mmol/L (ref 101–111)
CO2: 31 mmol/L (ref 22–32)
Creatinine, Ser: 0.42 mg/dL — ABNORMAL LOW (ref 0.61–1.24)
GFR calc non Af Amer: >60 mL/min (ref >60)
Glucose, Bld: 143 mg/dL — ABNORMAL HIGH (ref 65–99)
Potassium: 4.4 mmol/L (ref 3.5–5.1)
SODIUM: 136 mmol/L (ref 135–145)

## 2017-11-09 MED ORDER — PREDNISONE 20 MG PO TABS
40.0000 mg | ORAL_TABLET | Freq: Every day | ORAL | Status: AC
  Administered 2017-11-10: 40 mg via ORAL
  Filled 2017-11-09: qty 2

## 2017-11-09 MED ORDER — PREDNISONE 20 MG PO TABS: 20.0000 mg | ORAL_TABLET | Freq: Every day | ORAL | Status: DC

## 2017-11-09 MED ORDER — PREDNISONE 10 MG PO TABS: 10.0000 mg | ORAL_TABLET | Freq: Every day | ORAL | Status: DC

## 2017-11-09 MED ORDER — SENNOSIDES-DOCUSATE SODIUM 8.6-50 MG PO TABS
1.0000 | ORAL_TABLET | Freq: Two times a day (BID) | ORAL | Status: DC
  Administered 2017-11-09 – 2017-11-11 (×5): 1 via ORAL
  Filled 2017-11-09 (×4): qty 1

## 2017-11-09 MED ORDER — PREDNISONE 20 MG PO TABS
30.0000 mg | ORAL_TABLET | Freq: Every day | ORAL | Status: AC
  Administered 2017-11-11: 30 mg via ORAL
  Filled 2017-11-09: qty 1

## 2017-11-09 MED ORDER — IOPAMIDOL (ISOVUE-370) INJECTION 76%
125.0000 mL | Freq: Once | INTRAVENOUS | Status: AC | PRN
Start: 2017-11-09 — End: 2017-11-09
  Administered 2017-11-09: 125 mL via INTRAVENOUS

## 2017-11-09 MED ORDER — MAGNESIUM HYDROXIDE 400 MG/5ML PO SUSP
30.0000 mL | Freq: Every day | ORAL | Status: DC | PRN
  Administered 2017-11-09 – 2017-11-10 (×2): 30 mL via ORAL
  Filled 2017-11-09 (×3): qty 30

## 2017-11-09 NOTE — Progress Notes (Signed)
[facility] - [place] at [facility]   PATIENT NAME: Perry Bullock    MR#:  [id number]  DATE OF BIRTH:  14-Jan-1961  SUBJECTIVE:  she is sitting at the edge of the bed eating breakfast. Complains of bilateral lower extremity rest pain. He does have claudication. History of long-standing smoking.  REVIEW OF SYSTEMS:   Review of Systems  Constitutional: Negative for chills, fever and weight loss.  HENT: Negative for ear discharge, ear pain and nosebleeds.   Eyes: Negative for blurred vision, pain and discharge.  Respiratory: Negative for sputum production, shortness of breath, wheezing and stridor.   Cardiovascular: Negative for chest pain, palpitations, orthopnea and PND.  Gastrointestinal: Negative for abdominal pain, diarrhea, nausea and vomiting.  Genitourinary: Negative for frequency and urgency.  Musculoskeletal: Negative for back pain and joint pain.       Bilateral lower extremity leg pain  Neurological: Negative for sensory change, speech change, focal weakness and weakness.  Psychiatric/Behavioral: Negative for depression and hallucinations. The patient is not nervous/anxious.    Tolerating Diet: yes Tolerating PT: rehab  DRUG ALLERGIES:   Allergies  Allergen Reactions  . Ace Inhibitors Swelling    VITALS:  Blood pressure 100/74, pulse (!) 106, temperature 98.7 F (37.1 C), resp. rate 17, height 6' (1.829 m), weight 68.4 kg (150 lb 12.8 oz), SpO2 95 %.  PHYSICAL EXAMINATION:   Physical Exam  GENERAL:  57 y.o.-year-old patient lying in the bed with no acute distress.  EYES: Pupils equal, round, reactive to light and accommodation. No scleral icterus. Extraocular muscles intact.  HEENT: Head atraumatic, normocephalic. Oropharynx and nasopharynx clear.  NECK:  Supple, no jugular venous distention. No thyroid enlargement, no tenderness.  LUNGS: Normal breath sounds bilaterally, no wheezing, rales, rhonchi. No use of accessory muscles of  respiration.  CARDIOVASCULAR: S1, S2 normal. No murmurs, rubs, or gallops.  ABDOMEN: Soft, nontender, nondistended. Bowel sounds present. No organomegaly or mass.  EXTREMITIES: bilateral lower extremity dusky hue iin both LE with skin breakdown in wbe space and some edema NEUROLOGIC: Cranial nerves II through XII are intact. No focal Motor or sensory deficits b/l.   PSYCHIATRIC:  patient is alert and awake SKIN: No obvious rash, lesion, or ulcer.   LABORATORY PANEL:  CBC Recent Labs  Lab 11/05/17 0023  WBC 8.2  HGB 12.6*  HCT 36.9*  PLT 326    Chemistries  Recent Labs  Lab 11/05/17 0023  11/09/17 0355  NA 138   < > 136  K 4.1   < > 4.4  CL 105   < > 102  CO2 28   < > 31  GLUCOSE 113*   < > 143*  BUN 7   < > 14  CREATININE 0.62   < > 0.42*  CALCIUM 7.8*   < > 7.8*  MG  --    < > 1.8  AST 35  --   --   ALT 19  --   --   ALKPHOS 145*  --   --   BILITOT 1.1  --   --    < > = values in this interval not displayed.   Cardiac Enzymes Recent Labs  Lab 11/05/17 0023  TROPONINI <0.03   RADIOLOGY:  No results found. ASSESSMENT AND PLAN:    1.Acute on chronic low back pain  seen by orthopedics Dr. Odis Luster recommended prednisone tapering dose and outpatient follow-up in a week after discharge for steroid injections Voltaren gel topical applied and  pain medication as needed  2.UTI: Tachycardic U/A (+) UTI. UCx will species. D/ced Ceftriaxone 1g IV qD  3.EtOH abuse: Drinks daily, last drink yesterday. Does not quantify. EtOH < 10. CIWA, thiamine, folate, MVI. -scoring 0 at present   4.COPD/asthma: DuoNebs PRN. C/w Dulera, Spiriva.  5.B/L LE erythema/swelling Pt w/ dusky redness of B/L LE (below knees). . Tachycardic, however  - ABIs-nondiagnostic secondary to incompressible vessel calcifications -vascular consutl for claudication and rest pain -- spoke with Dr. Gilda Crease. Will do CT angiogram bilateral lower extremity  -patient has long standing history of  smoking.  6.Depression acute; psych consult placed, seen by Dr. Toni Amend who thinks it is dysphoria.   -Trazodone dose was increased to 200 mg at night Seroquel is at maximum dose 300 mg. - Recommending to add mirtazapine at night 15 mg to help with appetite and sleep. -  Patient does not meet criteria to be placed inpatient psychiatry floor -re-eval by Dr rakesh--pt at present not able to make medical decisions  7.d/c planning Seems to be homeless at this time, consult social worker for discharge planning.  Case discussed with Care Management/Social Worker. Management plans discussed with the patient, family and they are in agreement.  CODE STATUS: full  DVT Prophylaxis: lovenox  TOTAL TIME TAKING CARE OF THIS PATIENT: 30 minutes.  >50% time spent on counselling and coordination of care  POSSIBLE D/C IN ?? DAYS, DEPENDING ON CLINICAL CONDITION.  Note: This dictation was prepared with Dragon dictation along with smaller phrase technology. Any transcriptional errors that result from this process are unintentional.  Enedina Finner M.D on 11/09/2017 at 9:03 AM  Between 7am to 6pm - Pager - [phone]  After 6pm go to [email or web address] - Social research officer, government  [facility]  Office  [phone]  CC: Primary care physician; [facility], male   DOB: 1961/04/27, 57 y.o.   MRN: [id number]

## 2017-11-09 NOTE — Progress Notes (Signed)
Physical Therapy Treatment Patient Details Name: Perry Bullock MRN: [id number] DOB: 10/03/1960 Today's Date: 11/09/2017    History of Present Illness 57 y.o. male with a known history of COPD/asthma, GERD, chronic HCV, EtOH abuse, PVD, glaucoma, DJD/OA/CBP, anx/BPD who p/w UTI.  Admitted with dysthymia    PT Comments    Mr. Nuon made significant gains with mobility this session.  He performed bed mobility independently and performed sit>stand transfer with use of SPC and no signs of instability.   Pt ambulated in hallway using SPC as pt reports this is his baseline.  Pulse reading 134 after ambulating ~50 ft and up to 150 after ambulating 70 ft.  RN notified and to page MD.  Given pt's significantly improved mobility status, updated follow up recommendations.    Follow Up Recommendations  Home health PT     Equipment Recommendations  None recommended by PT    Recommendations for Other Services       Precautions / Restrictions Precautions Precautions: Fall;Other (comment) Precaution Comments: Monitor HR Restrictions Weight Bearing Restrictions: No    Mobility  Bed Mobility Overal bed mobility: Independent             General bed mobility comments: Pt performs independently without cues or physical assist  Transfers Overall transfer level: Modified independent Equipment used: Straight cane Transfers: Sit to/from Stand Sit to Stand: Modified independent (Device/Increase time)         General transfer comment: No instability noted.  Pt performs independently and safely with use of SPC.   Ambulation/Gait Ambulation/Gait assistance: Min guard Ambulation Distance (Feet): 70 Feet Assistive device: Straight cane Gait Pattern/deviations: WFL(Within Functional Limits)     General Gait Details: Pt with upright posture and no unsteadiness noted.  Pt uses SPC as pt reports this is his baseline.  Pulse reading 134 after ambulating ~50 ft and up to 150 after  ambulating 70 ft.  RN notified. Pt denies feeling heart racing.    Stairs             Wheelchair Mobility    Modified Rankin (Stroke Patients Only)       Balance Overall balance assessment: Needs assistance Sitting-balance support: No upper extremity supported;Feet supported Sitting balance-Leahy Scale: Fair     Standing balance support: No upper extremity supported;During functional activity Standing balance-Leahy Scale: Fair Standing balance comment: Pt able to stand statically without UE support but prefers SPC use when ambulating.                             Cognition Arousal/Alertness: Awake/alert Behavior During Therapy: WFL for tasks assessed/performed Overall Cognitive Status: Within Functional Limits for tasks assessed                                        Exercises      General Comments        Pertinent Vitals/Pain Pain Assessment: 0-10 Pain Score: 6  Pain Location: "my hips, knees, ankles" Pain Descriptors / Indicators: Aching;Discomfort Pain Intervention(s): Limited activity within patient's tolerance;Monitored during session    Home Living                      Prior Function            PT Goals (current goals can now be found in the care plan  section) Acute Rehab PT Goals Patient Stated Goal: Pt reports his legs are feeling better but he is still having some pain PT Goal Formulation: With patient Time For Goal Achievement: 11/20/17 Potential to Achieve Goals: Good Progress towards PT goals: Progressing toward goals    Frequency    Min 2X/week      PT Plan Discharge plan needs to be updated    Co-evaluation              AM-PAC PT "6 Clicks" Daily Activity  Outcome Measure  Difficulty turning over in bed (including adjusting bedclothes, sheets and blankets)?: None Difficulty moving from lying on back to sitting on the side of the bed? : None Difficulty sitting down on and standing up  from a chair with arms (e.g., wheelchair, bedside commode, etc,.)?: A Little Help needed moving to and from a bed to chair (including a wheelchair)?: A Little Help needed walking in hospital room?: A Little Help needed climbing 3-5 steps with a railing? : A Little 6 Click Score: 20    End of Session Equipment Utilized During Treatment: Gait belt Activity Tolerance: Treatment limited secondary to medical complications (Comment)(elevated HR) Patient left: with bed alarm set;with call bell/phone within reach;in bed Nurse Communication: Mobility status;Other (comment)(elevated HR (RN to page MD)) PT Visit Diagnosis: Unsteadiness on feet (R26.81);Muscle weakness (generalized) (M62.81);Difficulty in walking, not elsewhere classified (R26.2)     Time: 1914-7829 PT Time Calculation (min) (ACUTE ONLY): 11 min  Charges:  $Gait Training: 8-22 mins                    G Codes:       Encarnacion Chu PT, DPT 11/09/2017, 4:40 PM

## 2017-11-09 NOTE — Consult Note (Signed)
[facility] Vascular Consult Note  MRN : [id number]  Perry Bullock is a 57 y.o. (Oct 15, 1960) male who presents with chief complaint of  Chief Complaint  Patient presents with  . Weakness  .  History of Present Illness: I am asked to see the patient by Dr. Eliane Decree for left foot pain and PAD.  The patient was admitted with left foot pain was severe and unrelenting several days ago.  This was associated with redness and swelling.  He does describe some pain in both legs with activity.  The pain has improved since he was admitted but not resolved.  There is no clear inciting event or causative factor that started the symptoms.  Despite his younger age, he has a long history of alcohol and tobacco use and multiple other medical issues so consideration for PAD was high.  It sounds like he had noninvasive studies performed over the weekend which were basically nondiagnostic which is not surprising given the hospital's lack of non-invasive studies up until recently and inexperienced technologist.  He has subsequently undergone a CT angiogram which I have independently reviewed.  His CT angiogram demonstrates mild to moderate atherosclerotic changes without clear high-grade stenosis or occlusion in either lower extremity and three-vessel runoff distally.  Current Facility-Administered Medications  Medication Dose Route Frequency Provider Last Rate Last Dose  . albuterol (PROVENTIL) (2.5 MG/3ML) 0.083% nebulizer solution 2.5 mg  2.5 mg Nebulization Q6H PRN Barbaraann Rondo, MD   2.5 mg at 11/09/17 1024  . allopurinol (ZYLOPRIM) tablet 100 mg  100 mg Oral Daily Gouru, Aruna, MD   100 mg at 11/09/17 1005  . bisacodyl (DULCOLAX) EC tablet 5 mg  5 mg Oral Daily PRN Barbaraann Rondo, MD   5 mg at 11/08/17 1244  . diclofenac sodium (VOLTAREN) 1 % transdermal gel 2 g  2 g Topical QID Gouru, Aruna, MD   2 g at 11/09/17 1006  . enoxaparin (LOVENOX) injection 40 mg  40 mg  Subcutaneous Q24H Barbaraann Rondo, MD   40 mg at 11/09/17 0828  . feeding supplement (ENSURE ENLIVE) (ENSURE ENLIVE) liquid 237 mL  237 mL Oral TID BM Gouru, Aruna, MD   237 mL at 11/09/17 1003  . fluticasone furoate-vilanterol (BREO ELLIPTA) 200-25 MCG/INH 1 puff  1 puff Inhalation Daily Barbaraann Rondo, MD   1 puff at 11/09/17 1007  . folic acid (FOLVITE) tablet 1 mg  1 mg Oral Daily Barbaraann Rondo, MD   1 mg at 11/09/17 1005  . gabapentin (NEURONTIN) capsule 900 mg  900 mg Oral TID Barbaraann Rondo, MD   900 mg at 11/09/17 1004  . latanoprost (XALATAN) 0.005 % ophthalmic solution 1 drop  1 drop Both Eyes QHS Barbaraann Rondo, MD   1 drop at 11/08/17 2151  . magnesium hydroxide (MILK OF MAGNESIA) suspension 30 mL  30 mL Oral Daily PRN Enedina Finner, MD   30 mL at 11/09/17 1003  . mirtazapine (REMERON) tablet 15 mg  15 mg Oral QHS Clapacs, Jackquline Denmark, MD   15 mg at 11/08/17 2143  . morphine (MS CONTIN) 12 hr tablet 15 mg  15 mg Oral Q12H Gouru, Aruna, MD   15 mg at 11/09/17 1004  . multivitamin with minerals tablet 1 tablet  1 tablet Oral Daily Barbaraann Rondo, MD   1 tablet at 11/09/17 1005  . multivitamin-lutein (OCUVITE-LUTEIN) capsule 1 capsule  1 capsule Oral Daily Gouru, Aruna, MD   1 capsule at 11/09/17 1006  . ondansetron (  ZOFRAN) tablet 4 mg  4 mg Oral Q6H PRN Barbaraann Rondo, MD   4 mg at 11/08/17 2152   Or  . ondansetron (ZOFRAN) injection 4 mg  4 mg Intravenous Q6H PRN Barbaraann Rondo, MD      . oxyCODONE (Oxy IR/ROXICODONE) immediate release tablet 5-10 mg  5-10 mg Oral Q4H PRN Barbaraann Rondo, MD   10 mg at 11/09/17 1228  . pantoprazole (PROTONIX) EC tablet 40 mg  40 mg Oral Daily Barbaraann Rondo, MD   40 mg at 11/09/17 1005  . polyvinyl alcohol (LIQUIFILM TEARS) 1.4 % ophthalmic solution 1 drop  1 drop Both Eyes QID Gouru, Aruna, MD   1 drop at 11/09/17 1007  . predniSONE (DELTASONE) tablet 50 mg  50 mg Oral Q breakfast Gouru, Aruna, MD   50 mg at  11/09/17 0829  . QUEtiapine (SEROQUEL) tablet 300 mg  300 mg Oral QHS Barbaraann Rondo, MD   300 mg at 11/08/17 2152  . senna-docusate (Senokot-S) tablet 1 tablet  1 tablet Oral QHS PRN Barbaraann Rondo, MD      . sucralfate (CARAFATE) tablet 1 g  1 g Oral TID WC & HS Barbaraann Rondo, MD   1 g at 11/09/17 1227  . tamsulosin (FLOMAX) capsule 0.4 mg  0.4 mg Oral Daily Barbaraann Rondo, MD   0.4 mg at 11/09/17 1005  . thiamine (VITAMIN B-1) tablet 100 mg  100 mg Oral Daily Barbaraann Rondo, MD   100 mg at 11/09/17 1004  . tiotropium (SPIRIVA) inhalation capsule 18 mcg  18 mcg Inhalation Daily Barbaraann Rondo, MD   18 mcg at 11/09/17 1157  . traZODone (DESYREL) tablet 200 mg  200 mg Oral QHS Clapacs, John T, MD   200 mg at 11/08/17 2143  . zolpidem (AMBIEN) tablet 10 mg  10 mg Oral QHS PRN Barbaraann Rondo, MD        Past Medical History:  Diagnosis Date  . Anxiety   . Arthritis   . Asthma   . Bipolar 1 disorder (HCC)   . Blind right eye   . Chronic back pain    Diverticulosis  . Chronic headaches   . Convulsion (HCC)    once a month when stands up too quickly  . COPD (chronic obstructive pulmonary disease) (HCC)   . DDD (degenerative disc disease), lumbar   . Diverticulitis   . GERD (gastroesophageal reflux disease)   . Glaucoma   . Hepatitis    hepatitis C  . Pneumonia   . PVD (peripheral vascular disease) (HCC)    lower extremities reddened and feet swollen  . Seasonal allergies   . Shortness of breath dyspnea     Past Surgical History:  Procedure Laterality Date  . EYE SURGERY Right   . INTRAMEDULLARY (IM) NAIL INTERTROCHANTERIC Right 01/16/2017   Procedure: INTRAMEDULLARY (IM) NAIL INTERTROCHANTRIC;  Surgeon: Christena Flake, MD;  Location: [facility] ORS;  Service: Orthopedics;  Laterality: Right;  . INTUBATION-ENDOTRACHEAL WITH TRACHEOSTOMY STANDBY N/A 04/22/2015   Procedure: INTUBATION-ENDOTRACHEAL WITH TRACHEOSTOMY STANDBY;  Surgeon: Linus Salmons, MD;   Location: [facility] ORS;  Service: ENT;  Laterality: N/A;  . JOINT REPLACEMENT Left    Lt shoulder  . MANDIBLE SURGERY      Social History Social History   Tobacco Use  . Smoking status: Current Every Day Smoker    Packs/day: 0.00    Years: 35.00    Pack years: 0.00    Types: Cigarettes  . Smokeless tobacco: Never Used  . Tobacco  comment: 2-3 cigs daily--02/27/16  Substance Use Topics  . Alcohol use: Yes    Alcohol/week: 3.6 oz    Types: 6 Cans of beer per week    Comment: occ  . Drug use: No    Family History Family History  Problem Relation Age of Onset  . Breast cancer Mother   . Hypertension Father   . Diabetes Father   No bleeding or clotting disorders  Allergies  Allergen Reactions  . Ace Inhibitors Swelling     REVIEW OF SYSTEMS (Negative unless checked)  Constitutional: Weight loss  Fever  Chills Cardiac: Chest pain   Chest pressure   Palpitations   Shortness of breath when laying flat   Shortness of breath at rest   Shortness of breath with exertion. Vascular:  Pain in legs with walking   Pain in legs at rest   Pain in legs when laying flat   Claudication   Pain in feet when walking  Pain in feet at rest  Pain in feet when laying flat   History of DVT   Phlebitis   Swelling in legs   Varicose veins   Non-healing ulcers Pulmonary:   Uses home oxygen   Productive cough   Hemoptysis   Wheeze  COPD   Asthma Neurologic:  Dizziness  Blackouts   Seizures   History of stroke   History of TIA  Aphasia   Temporary blindness   Dysphagia   Weakness or numbness in arms   Weakness or numbness in legs Musculoskeletal:  Arthritis   Joint swelling   Joint pain   Low back pain Hematologic:  Easy bruising  Easy bleeding   Hypercoagulable state   Anemic  Hepatitis Gastrointestinal:  Blood in stool   Vomiting blood  Gastroesophageal reflux/heartburn   Difficulty  swallowing. Genitourinary:  Chronic kidney disease   Difficult urination  Frequent urination  Burning with urination   Blood in urine Skin:  Rashes   Ulcers   Wounds Psychological:  History of anxiety    History of major depression.  Physical Examination  Vitals:   11/08/17 1522 11/08/17 2143 11/09/17 0022 11/09/17 0748  BP: 99/71 100/65 108/74 100/74  Pulse: (!) 108  99 (!) 106  Resp: Temp: 98.5 F (36.9 C)  98.4 F (36.9 C) 98.7 F (37.1 C)  TempSrc: Oral  Oral   SpO2: 93%  95% 95%  Weight:      Height:       Body mass index is 20.45 kg/m. Gen:  WD/WN, NAD Head: /AT, No temporalis wasting. Ear/Nose/Throat: Hearing grossly intact, nares w/o erythema or drainage, oropharynx w/o Erythema/Exudate Eyes: Blind in the right eye Neck: Trachea midline.  No JVD.  Pulmonary:  Good air movement, respirations not labored, equal bilaterally.  Cardiac: RRR, no JVD Vascular:  Vessel Right Left  Radial Palpable Palpable                          PT Palpable  1+ palpable  DP Palpable Palpable    Musculoskeletal: M/S 5/5 throughout.  1+ left lower extremity swelling with some mild redness in the forefoot and toes.  No deformity or atrophy. Neurologic: Sensation grossly intact in extremities.  Symmetrical.  Speech is fluent. Motor exam as listed above. Psychiatric: Judgment intact, Mood & affect appropriate for pt's clinical situation. Dermatologic: No rashes or ulcers noted.  No cellulitis or open wounds.  CBC Lab Results  Component Value Date   WBC 8.2 11/05/2017   HGB 12.6 (L) 11/05/2017   HCT 36.9 (L) 11/05/2017   MCV 102.9 (H) 11/05/2017   PLT 326 11/05/2017    BMET    Component Value Date/Time   NA 136 11/09/2017 0355   NA 139 07/13/2014 0933   K 4.4 11/09/2017 0355   K 3.9 07/13/2014 0933   CL 102 11/09/2017 0355   CL 105 07/13/2014 0933   CO2 31 11/09/2017 0355   CO2 25 07/13/2014 0933   GLUCOSE 143 (H) 11/09/2017  0355   GLUCOSE 92 07/13/2014 0933   BUN 14 11/09/2017 0355   BUN 4 (L) 07/13/2014 0933   CREATININE 0.42 (L) 11/09/2017 0355   CREATININE 0.79 07/13/2014 0933   CALCIUM 7.8 (L) 11/09/2017 0355   CALCIUM 8.1 (L) 07/13/2014 0933   GFRNONAA >60 11/09/2017 0355   GFRNONAA >60 07/13/2014 0933   GFRNONAA 42 (L) 04/22/2013 0750   GFRAA >60 11/09/2017 0355   GFRAA >60 07/13/2014 0933   GFRAA 48 (L) 04/22/2013 0750   Estimated Creatinine Clearance: 99.8 mL/min (A) (by C-G formula based on SCr of 0.42 mg/dL (L)).  COAG Lab Results  Component Value Date   INR 1.00 11/05/2017   INR 0.96 01/15/2017   INR 1.16 09/30/2010    Radiology Dg Lumbar Spine 2-3 Views  Result Date: 11/06/2017 CLINICAL DATA:  Continued low back pain since falling off a roof 2 years ago. EXAM: LUMBAR SPINE - 2-3 VIEW COMPARISON:  Abdomen CT dated 04/23/2015. FINDINGS: Five non-rib-bearing lumbar vertebrae. Marked disc space narrowing and discogenic sclerosis are again demonstrated at the L4-5 level with progressive anterolisthesis at that level. Mild anterior and posterior spur formation at multiple levels of the lumbar lower thoracic spine. Stable 30% T12 superior endplate compression deformity with no acute fracture lines. Interval 50% T10 vertebral compression deformity with no acute fracture lines visualized. No significant bony retropulsion visualized. Atheromatous arterial calcifications. IMPRESSION: 1. Marked degenerative changes at the L4-5 level with progressive anterolisthesis at that level, currently grade 1-2. 2. Mild lumbar and lower thoracic spine degenerative changes. 3. Stable old T12 compression deformity and interval old T10 compression deformity. Electronically Signed   By: Beckie Salts M.D.   On: 11/06/2017 20:46   Ct Angio Ao+bifem W & Or Wo Contrast  Result Date: 11/09/2017 CLINICAL DATA:  Bilateral lower extremity erythema and swelling x1 week. Persistent cellulitis. EXAM: CT ANGIOGRAPHY OF ABDOMINAL  AORTA WITH ILIOFEMORAL RUNOFF TECHNIQUE: Multidetector CT imaging of the abdomen, pelvis and lower extremities was performed using the standard protocol during bolus administration of intravenous contrast. Multiplanar CT image reconstructions and MIPs were obtained to evaluate the vascular anatomy. CONTRAST:  ISOVUE-370 IOPAMIDOL (ISOVUE-370) INJECTION 76% COMPARISON:  04/23/2015 CT abdomen, CT chest 06/11/2016 FINDINGS: VASCULAR Aorta: Moderate partially calcified atheromatous plaque particularly in the infrarenal segment. No aneurysm, dissection, or stenosis. Celiac: Patent without evidence of aneurysm, dissection, vasculitis or significant stenosis. SMA: Patent without evidence of aneurysm, dissection, vasculitis or significant stenosis. Renals: Single right, with partially calcified ostial plaque but no significant stenosis, early bifurcation, patent distally. On the left there are 3 renal arteries, the middle is dominant, all appear grossly patent. IMA: Patent without evidence of aneurysm, dissection, vasculitis or significant stenosis. RIGHT Lower Extremity Inflow: Calcified eccentric plaque through the common iliac without high-grade stenosis. Internal iliac patent. External iliac unremarkable. Outflow: Eccentric nonocclusive plaque in the common femoral artery. Deep femoral branches patent. Mild plaque in the  SFA and popliteal artery without high-grade stenosis. Runoff: Mildly atheromatous but contiguous three-vessel runoff. LEFT Lower Extremity Inflow: Heavily calcified plaque in the common iliac without high-grade stenosis. Internal and external iliac arteries unremarkable. Outflow: Eccentric nonocclusive plaque in the common femoral. Deep femoral branches patent. Scattered eccentric plaque in the SFA and popliteal without high-grade stenosis. Runoff: Mildly diseased but contiguous three-vessel runoff. Veins: Early venous filling in the left lower leg probably secondary to hyperemia. No obvious  venous abnormality within the limitations of this arterial phase study. Review of the MIP images confirms the above findings. NON-VASCULAR Lower chest: Small bilateral pleural effusions. Partial calcified pleural plaque at the left lung base. Hepatobiliary: No focal liver abnormality is seen. No gallstones, gallbladder wall thickening, or biliary dilatation. Pancreas: Unremarkable. No pancreatic ductal dilatation or surrounding inflammatory changes. Spleen: Normal in size without focal abnormality. Adrenals/Urinary Tract: Adrenal glands and kidneys unremarkable. There is gas in the urinary bladder which is incompletely distended. Stomach/Bowel: Stomach is distended by ingested material. Small bowel is moderately distended throughout without evidence of obstruction. Normal appendix. Scattered distal descending and sigmoid diverticula without adjacent inflammatory/edematous change or abscess. Lymphatic: No abdominal or pelvic adenopathy. Reproductive: Prostate is unremarkable. Other: No ascites.  No free air. Musculoskeletal: Compression deformities T10, T11, and T12, stable since 06/11/2016 CT chest. Advanced degenerative disc disease L4-5 and L5-S1. Grade 1 anterolisthesis L4-5 probably related to advanced facet DJD at this level. IM rod and sliding screw across right femoral intertrochanteric fracture with bridging callus. No acute fracture or worrisome bone lesion. IMPRESSION: VASCULAR 1. Aortoiliac atherosclerosis (ICD10-170.0) without significant stenosis. 2. Bilateral femoropopliteal atherosclerosis without significant stenosis, contiguous bilateral three-vessel runoff. NON-VASCULAR 1. Gas in the urinary bladder. Correlate with recent instrumentation or Foley placement. 2. Small bilateral pleural effusions, new since 06/11/2016 3. Scattered descending and sigmoid diverticula. 4. Chronic left pleural plaques Electronically Signed   By: Corlis Leak M.D.   On: 11/09/2017 10:38   US Arterial Abi (screening Lower  Extremity)  Result Date: 11/05/2017 CLINICAL DATA:  Peripheral vascular disease. Bilateral lower extremity rest pain, claudication. Previous tobacco abuse. EXAM: NONINVASIVE PHYSIOLOGIC VASCULAR STUDY OF BILATERAL LOWER EXTREMITIES TECHNIQUE: Evaluation of both lower extremities were performed at rest, including calculation of ankle-brachial indices with single level Doppler, pressure and pulse volume recording. COMPARISON:  None available FINDINGS: Right ABI:  1.49 Left ABI:  1.54 Right Lower Extremity:  Monophasic arterial waveforms distally. Left Lower Extremity:  Monophasic arterial waveforms distally. > 1.4 Non diagnostic secondary to incompressible vessel calcifications (medial arterial sclerosis of [place]ckeberg) IMPRESSION: 1. Non diagnostic secondary to incompressible vessel calcifications (medial arterial sclerosis of [place]cke[place]) Electronically Signed   By: Corlis Leak M.D.   On: 11/05/2017 11:28   US Abdomen Limited Ruq  Result Date: 11/05/2017 CLINICAL DATA:  57 year old male with abdominal pain and diarrhea. EXAM: ULTRASOUND ABDOMEN LIMITED RIGHT UPPER QUADRANT COMPARISON:  CT of the abdomen pelvis dated 04/23/2015 FINDINGS: Gallbladder: There are small gallstones. There is no gallbladder wall thickening or pericholecystic fluid. Negative sonographic Murphy's sign. Common bile duct: Diameter: 5 mm Liver: Diffuse increased liver echogenicity consistent with fatty infiltration. Portal vein is patent on color Doppler imaging with normal direction of blood flow towards the liver. IMPRESSION: Cholelithiasis without sonographic evidence of acute cholecystitis. Fatty liver. Electronically Signed   By: Elgie Collard M.D.   On: 11/05/2017 04:08      Assessment/Plan 1.  Lower extremity pain.  More likely gout or arthritic pain.  His CT angiogram does not show  significant disease that would explain ischemic rest pain. 2.  PAD.  Fairly mild. His CT angiogram demonstrates mild to moderate atherosclerotic  changes without clear high-grade stenosis or occlusion in either lower extremity and three-vessel runoff distally.  No intervention would be of benefit currently.  I will be happy to follow him up as an outpatient and can see him in 3 to 6 months with noninvasive studies with an experienced technologist. 3.  Tobacco use.  Increases his risk of atherosclerotic disease.   Festus Barren, MD  11/09/2017 2:30 PM    This note was created with Dragon medical transcription system.  Any error is purely unintentional

## 2017-11-09 NOTE — Progress Notes (Signed)
Patient's SNF level [facility] is pending. Patient has no SNF bed offers. CSW contacted [facility] admissions coordinator at [facility] and she stated they can't make a bed offer due to substance abuse. CSW contacted [facility] admissions coordinator at [facility] and asked her to review referral.   [facility] LCSW [phone]

## 2017-11-09 NOTE — Progress Notes (Signed)
Pharmacy Electrolyte Monitoring Consult:  Pharmacy consulted to assist in monitoring and replacing electrolytes in this 57 y.o. male with a h/o PVD, COPD and ETOH abuse admitted on 11/05/2017 with UTI.  Labs:  Sodium (mmol/L)  Date Value  11/09/2017 136  07/13/2014 139   Potassium (mmol/L)  Date Value  11/09/2017 4.4  07/13/2014 3.9   Magnesium (mg/dL)  Date Value  13/01/6577 1.8   Phosphorus (mg/dL)  Date Value  46/96/2952 3.3   Calcium (mg/dL)  Date Value  84/13/2440 7.8 (L)   Calcium, Total (mg/dL)  Date Value  04/26/2535 8.1 (L)   Albumin (g/dL)  Date Value  64/40/3474 2.1 (L)  07/13/2014 3.1 (L)    Assessment/Plan: Electrolytes remain WNL. Will recheck labs in 48 hours.   Valentina Gu, PharmD, BCPS Clinical Pharmacist 11/09/2017 9:51 AM

## 2017-11-10 MED ORDER — POLYETHYLENE GLYCOL 3350 17 G PO PACK
17.0000 g | PACK | Freq: Every day | ORAL | Status: DC
Start: 2017-11-10 — End: 2017-11-11
  Administered 2017-11-10 – 2017-11-11 (×2): 17 g via ORAL
  Filled 2017-11-10 (×2): qty 1

## 2017-11-10 MED ORDER — GUAIFENESIN ER 600 MG PO TB12
600.0000 mg | ORAL_TABLET | Freq: Two times a day (BID) | ORAL | Status: DC
  Administered 2017-11-10 – 2017-11-11 (×3): 600 mg via ORAL
  Filled 2017-11-10 (×3): qty 1

## 2017-11-10 MED ORDER — BISACODYL 10 MG RE SUPP
10.0000 mg | Freq: Every day | RECTAL | Status: DC | PRN
  Administered 2017-11-10: 10 mg via RECTAL
  Filled 2017-11-10: qty 1

## 2017-11-10 NOTE — Progress Notes (Signed)
[facility] [place]   PATIENT NAME: Perry Bullock    MR#:  [id number]  DATE OF BIRTH:  01-15-1961  SUBJECTIVE:  she is sitting at the edge of the bed eating breakfast. Complains of bilateral lower extremity rest pain. He does have claudication. History of long-standing smoking.  REVIEW OF SYSTEMS:   Review of Systems  Constitutional: Negative for chills, fever and weight loss.  HENT: Negative for ear discharge, ear pain and nosebleeds.   Eyes: Negative for blurred vision, pain and discharge.  Respiratory: Negative for sputum production, shortness of breath, wheezing and stridor.   Cardiovascular: Negative for chest pain, palpitations, orthopnea and PND.  Gastrointestinal: Negative for abdominal pain, diarrhea, nausea and vomiting.  Genitourinary: Negative for frequency and urgency.  Musculoskeletal: Negative for back pain and joint pain.       Bilateral lower extremity leg pain  Neurological: Negative for sensory change, speech change, focal weakness and weakness.  Psychiatric/Behavioral: Negative for depression and hallucinations. The patient is not nervous/anxious.    Tolerating Diet: yes Tolerating PT: rehab  DRUG ALLERGIES:   Allergies  Allergen Reactions  . Ace Inhibitors Swelling    VITALS:  Blood pressure (!) 112/94, pulse (!) 124, temperature 97.8 F (36.6 C), temperature source Oral, resp. rate 16, height 6' (1.829 m), weight 68.4 kg (150 lb 12.8 oz), SpO2 96 %.  PHYSICAL EXAMINATION:   Physical Exam  GENERAL:  57 y.o.-year-old patient lying in the bed with no acute distress.  EYES: Pupils equal, round, reactive to light and accommodation. No scleral icterus. Extraocular muscles intact.  HEENT: Head atraumatic, normocephalic. Oropharynx and nasopharynx clear.  NECK:  Supple, no jugular venous distention. No thyroid enlargement, no tenderness.  LUNGS: Normal breath sounds bilaterally, no wheezing, rales, rhonchi. No use of  accessory muscles of respiration.  CARDIOVASCULAR: S1, S2 normal. No murmurs, rubs, or gallops.  ABDOMEN: Soft, nontender, nondistended. Bowel sounds present. No organomegaly or mass.  EXTREMITIES: bilateral lower extremity dusky hue iin both LE with skin breakdown in wbe space and some edema NEUROLOGIC: Cranial nerves II through XII are intact. No focal Motor or sensory deficits b/l.   PSYCHIATRIC:  patient is alert and awake SKIN: No obvious rash, lesion, or ulcer.   LABORATORY PANEL:  CBC Recent Labs  Lab 11/05/17 0023  WBC 8.2  HGB 12.6*  HCT 36.9*  PLT 326    Chemistries  Recent Labs  Lab 11/05/17 0023  11/09/17 0355  NA 138   < > 136  K 4.1   < > 4.4  CL 105   < > 102  CO2 28   < > 31  GLUCOSE 113*   < > 143*  BUN 7   < > 14  CREATININE 0.62   < > 0.42*  CALCIUM 7.8*   < > 7.8*  MG  --    < > 1.8  AST 35  --   --   ALT 19  --   --   ALKPHOS 145*  --   --   BILITOT 1.1  --   --    < > = values in this interval not displayed.   Cardiac Enzymes Recent Labs  Lab 11/05/17 0023  TROPONINI <0.03   RADIOLOGY:  Ct Angio Ao+bifem W & Or Wo Contrast  Result Date: 11/09/2017 CLINICAL DATA:  Bilateral lower extremity erythema and swelling x1 week. Persistent cellulitis. EXAM: CT ANGIOGRAPHY OF ABDOMINAL AORTA WITH ILIOFEMORAL RUNOFF TECHNIQUE: Multidetector CT  imaging of the abdomen, pelvis and lower extremities was performed using the standard protocol during bolus administration of intravenous contrast. Multiplanar CT image reconstructions and MIPs were obtained to evaluate the vascular anatomy. CONTRAST:  ISOVUE-370 IOPAMIDOL (ISOVUE-370) INJECTION 76% COMPARISON:  04/23/2015 CT abdomen, CT chest 06/11/2016 FINDINGS: VASCULAR Aorta: Moderate partially calcified atheromatous plaque particularly in the infrarenal segment. No aneurysm, dissection, or stenosis. Celiac: Patent without evidence of aneurysm, dissection, vasculitis or significant stenosis. SMA: Patent  without evidence of aneurysm, dissection, vasculitis or significant stenosis. Renals: Single right, with partially calcified ostial plaque but no significant stenosis, early bifurcation, patent distally. On the left there are 3 renal arteries, the middle is dominant, all appear grossly patent. IMA: Patent without evidence of aneurysm, dissection, vasculitis or significant stenosis. RIGHT Lower Extremity Inflow: Calcified eccentric plaque through the common iliac without high-grade stenosis. Internal iliac patent. External iliac unremarkable. Outflow: Eccentric nonocclusive plaque in the common femoral artery. Deep femoral branches patent. Mild plaque in the SFA and popliteal artery without high-grade stenosis. Runoff: Mildly atheromatous but contiguous three-vessel runoff. LEFT Lower Extremity Inflow: Heavily calcified plaque in the common iliac without high-grade stenosis. Internal and external iliac arteries unremarkable. Outflow: Eccentric nonocclusive plaque in the common femoral. Deep femoral branches patent. Scattered eccentric plaque in the SFA and popliteal without high-grade stenosis. Runoff: Mildly diseased but contiguous three-vessel runoff. Veins: Early venous filling in the left lower leg probably secondary to hyperemia. No obvious venous abnormality within the limitations of this arterial phase study. Review of the MIP images confirms the above findings. NON-VASCULAR Lower chest: Small bilateral pleural effusions. Partial calcified pleural plaque at the left lung base. Hepatobiliary: No focal liver abnormality is seen. No gallstones, gallbladder wall thickening, or biliary dilatation. Pancreas: Unremarkable. No pancreatic ductal dilatation or surrounding inflammatory changes. Spleen: Normal in size without focal abnormality. Adrenals/Urinary Tract: Adrenal glands and kidneys unremarkable. There is gas in the urinary bladder which is incompletely distended. Stomach/Bowel: Stomach is distended by  ingested material. Small bowel is moderately distended throughout without evidence of obstruction. Normal appendix. Scattered distal descending and sigmoid diverticula without adjacent inflammatory/edematous change or abscess. Lymphatic: No abdominal or pelvic adenopathy. Reproductive: Prostate is unremarkable. Other: No ascites.  No free air. Musculoskeletal: Compression deformities T10, T11, and T12, stable since 06/11/2016 CT chest. Advanced degenerative disc disease L4-5 and L5-S1. Grade 1 anterolisthesis L4-5 probably related to advanced facet DJD at this level. IM rod and sliding screw across right femoral intertrochanteric fracture with bridging callus. No acute fracture or worrisome bone lesion. IMPRESSION: VASCULAR 1. Aortoiliac atherosclerosis (ICD10-170.0) without significant stenosis. 2. Bilateral femoropopliteal atherosclerosis without significant stenosis, contiguous bilateral three-vessel runoff. NON-VASCULAR 1. Gas in the urinary bladder. Correlate with recent instrumentation or Foley placement. 2. Small bilateral pleural effusions, new since 06/11/2016 3. Scattered descending and sigmoid diverticula. 4. Chronic left pleural plaques Electronically Signed   By: Corlis Leak M.D.   On: 11/09/2017 10:38   ASSESSMENT AND PLAN:    1.Acute on chronic low back pain  seen by orthopedics Dr. Odis Luster recommended prednisone tapering dose and outpatient follow-up in a week after discharge for steroid injections Voltaren gel topical applied and pain medication as needed  2.UTI: Tachycardic U/A (+) UTI. UCx will species. D/ced Ceftriaxone 1g IV qD  3.EtOH abuse: Drinks daily, last drink yesterday. Does not quantify. EtOH < 10. CIWA, thiamine, folate, MVI. -scoring 0 at present   4.COPD/asthma: DuoNebs PRN. C/w Dulera, Spiriva.  5.B/L LE erythema/swelling Pt w/ dusky redness of B/L  LE (below knees). . Tachycardic, however  - ABIs-nondiagnostic secondary to incompressible vessel  calcifications -vascular consutl for claudication and rest pain -- patient was seen by vascular Dr. Festus Barren. CT angiogram does not show significant PVD. He has mild PVD which can be managed medically. -patient has long standing history of smoking.  6.Depression acute; psych consult placed, seen by Dr. Toni Amend who thinks it is dysphoria.   -Trazodone dose was increased to 200 mg at night Seroquel is at maximum dose 300 mg. - Recommending to add mirtazapine at night 15 mg to help with appetite and sleep. -  Patient does not meet criteria to be placed inpatient psychiatry floor -re-eval by Dr rakesh--pt at present not able to make medical decisions. Discussed with Dr. Toni Amend to see if patient can be reevaluated getting his ability to make decisions.  7.d/c planning Seems to be homeless at this time, consult social worker for discharge planning.  Case discussed with Care Management/Social Worker. Management plans discussed with the patient, family and they are in agreement.  CODE STATUS: full  DVT Prophylaxis: lovenox  TOTAL TIME TAKING CARE OF THIS PATIENT: 30 minutes.  >50% time spent on counselling and coordination of care  POSSIBLE D/C IN ?? DAYS, DEPENDING ON CLINICAL CONDITION.  Note: This dictation was prepared with Dragon dictation along with smaller phrase technology. Any transcriptional errors that result from this process are unintentional.  Enedina Finner M.D on 11/10/2017 at 2:37 PM  Between 7am to 6pm - Pager - [phone]  After 6pm go to [email or web address] - Social research officer, government  [facility]  Office  [phone]  CC: [facility] physician; [facility], male   DOB: 10-05-1960, 57 y.o.   MRN: [id number]

## 2017-11-10 NOTE — Progress Notes (Signed)
Jacqualin Combes PASARR evaluator came to visit patient today and stated that he does not require SNF placement and he will not be issued a SNF level PASARR. PT is recommending home health. Clinical Education officer, museum (CSW) met with patient alone at bedside to discuss D/C plan and he was sitting up on the side of the bed eating lunch. CSW explained that SNF is not an option because he no longer needs it and he will not get a SNF level PASARR. CSW explained that group home and family care home options and that he would have to sign over his disability check. CSW explained that a group home and family care home is long term placement. Patient reported that he did not want to go live in a group home and will find somewhere to go. Per patient he call around to friends and family today to try and find a place to go. CSW made patient aware that he will likely D/C tomorrow and that the only emergency housing is the homeless shelter. Patient reported that he would not go to the shelter and he will work on finding a place to go. RN case manager aware of above. RN aware of above.   [employer], LCSW [phone]

## 2017-11-10 NOTE — Progress Notes (Signed)
Pt requesting a suppository after unsuccessful attempts to have BM. MD Allena Katz paged. Verbal orders for dulcolax suppository or fleet enema to be administered.

## 2017-11-10 NOTE — Consult Note (Signed)
[facility] Consult   Reason for Consult: Follow-up consult for 57 year old man with a history of mood symptoms and substance abuse.  Specifically asked to reassess "capacity" Referring Physician: Posey Pronto Patient Identification: Perry Bullock MRN:  [id number] Principal Diagnosis: Dysthymia Diagnosis:   Patient Active Problem List   Diagnosis Date Noted  . Complicated UTI (urinary tract infection) [N39.0] 11/05/2017  . Pressure injury of skin [L89.90] 11/05/2017  . Dysthymia [F34.1] 11/05/2017  . Closed intertrochanteric fracture of hip, right, initial encounter ([place]) [S72.141A] 01/15/2017  . Substance induced mood disorder ([place]) [F19.94] 03/05/2016  . Alcohol abuse [F10.10] 03/05/2016  . Cocaine abuse ([place]) [F14.10] 03/05/2016  . Angioedema [T78.3XXA] 04/22/2015  . Abscess of upper lobe of right lung without pneumonia ([place]) [J85.2] 04/03/2015  . COPD (chronic obstructive pulmonary disease) ([place]) [J44.9] 04/03/2015  . Hep C w/o coma, chronic ([place]) [B18.2] 04/03/2015  . Bipolar disorder ([place]) [F31.9] 04/03/2015    Total Time spent with patient: 30 minutes  Subjective:   Perry Bullock is a 57 y.o. male patient admitted with "I am all stopped up"..  HPI: Patient interviewed, chart reviewed.  I saw this patient last Thursday and he was seen again over the weekend.  The psychiatrist on call over the weekend expressed the opinion that the patient did not have capacity to make medical decisions.  I was asked to reassess this today.  I found the patient awake and alert and cooperative.  He was oriented to date and situation and place.  He was able to tell me his chief complaint that brought him into the hospital.  Patient was able to tell me a little bit about the kind of medications that he takes outside the hospital.  His chief complaint today is that he is constipated which she attributes to having been fed too much protein since he has been in the hospital.  He denies feeling  suicidal or homicidal.  Patient says that he feels like he has options of places to go to stay when he gets outside the hospital specifically having several friends he can call.  He acknowledges that if all else fails he might have to go to the shelter although he would prefer not to.  Patient knows that he gets his outpatient follow-up treatment at the [facility] clinic and knows that he needs to continue taking care of his health.  He knows how to come back to the hospital if things worsen.  Social history: Had been living with his wife but for reasons that seem unclear to all of that she is refusing to take him back right now.  Patient speculates that it is because she wants him out of the house so that she can "party".  Medical history: History of hepatitis C COPD weight loss recent cellulitis  Substance abuse history: History of alcohol and cocaine use  Past Psychiatric History: Patient has been assessed in the past for substance-induced mood symptoms depression possible bipolar although it does not seem to be a classic presentation.  See previous notes.  Risk to Self: Is patient at risk for suicide?: No Risk to Others:   Prior Inpatient Therapy:   Prior Outpatient Therapy:    Past Medical History:  Past Medical History:  Diagnosis Date  . Anxiety   . Arthritis   . Asthma   . Bipolar 1 disorder ([place])   . Blind right eye   . Chronic back pain    Diverticulosis  . Chronic headaches   .  Convulsion ([place])    once a month when stands up too quickly  . COPD (chronic obstructive pulmonary disease) ([place])   . DDD (degenerative disc disease), lumbar   . Diverticulitis   . GERD (gastroesophageal reflux disease)   . Glaucoma   . Hepatitis    hepatitis C  . Pneumonia   . PVD (peripheral vascular disease) ([facility])    lower extremities reddened and feet swollen  . Seasonal allergies   . Shortness of breath dyspnea     Past Surgical History:  Procedure Laterality Date  . EYE SURGERY Right   .  INTRAMEDULLARY (IM) NAIL INTERTROCHANTERIC Right 01/16/2017   Procedure: INTRAMEDULLARY (IM) NAIL INTERTROCHANTRIC;  Surgeon: Corky Mull, MD;  Location: [facility] ORS;  Service: Orthopedics;  Laterality: Right;  . INTUBATION-ENDOTRACHEAL WITH TRACHEOSTOMY STANDBY N/A 04/22/2015   Procedure: INTUBATION-ENDOTRACHEAL WITH TRACHEOSTOMY STANDBY;  Surgeon: Beverly Gust, MD;  Location: [facility] ORS;  Service: ENT;  Laterality: N/A;  . JOINT REPLACEMENT Left    Lt shoulder  . MANDIBLE SURGERY     Family History:  Family History  Problem Relation Age of Onset  . Breast cancer Mother   . Hypertension Father   . Diabetes Father    Family Psychiatric  History: See previous notes.  Alcohol abuse but little other history Social History:  Social History   Substance and Sexual Activity  Alcohol Use Yes  . Alcohol/week: 3.6 oz  . Types: 6 Cans of beer per week   Comment: occ     Social History   Substance and Sexual Activity  Drug Use No    Social History   Socioeconomic History  . Marital status: Single    Spouse name: Not on file  . Number of children: Not on file  . Years of education: Not on file  . Highest education level: Not on file  Occupational History  . Not on file  Social Needs  . Financial resource strain: Not on file  . Food insecurity:    Worry: Not on file    Inability: Not on file  . Transportation needs:    Medical: Not on file    Non-medical: Not on file  Tobacco Use  . Smoking status: Current Every Day Smoker    Packs/day: 0.00    Years: 35.00    Pack years: 0.00    Types: Cigarettes  . Smokeless tobacco: Never Used  . Tobacco comment: 2-3 cigs daily--02/27/16  Substance and Sexual Activity  . Alcohol use: Yes    Alcohol/week: 3.6 oz    Types: 6 Cans of beer per week    Comment: occ  . Drug use: No  . Sexual activity: Yes    Birth control/protection: None  Lifestyle  . Physical activity:    Days per week: Not on file    Minutes per session: Not on file   . Stress: Not on file  Relationships  . Social connections:    Talks on phone: Not on file    Gets together: Not on file    Attends religious service: Not on file    Active member of club or organization: Not on file    Attends meetings of clubs or organizations: Not on file    Relationship status: Not on file  Other Topics Concern  . Not on file  Social History Narrative  . Not on file   Additional Social History:    Allergies:   Allergies  Allergen Reactions  . Ace Inhibitors Swelling  Labs:  Results for orders placed or performed during the hospital encounter of 11/05/17 (from the past 48 hour(s))  Magnesium     Status: None   Collection Time: 11/09/17  3:55 AM  Result Value Ref Range   Magnesium 1.8 1.7 - 2.4 mg/dL    Comment: Performed at [facility], [address] [place], [place] [address]  Basic metabolic panel     Status: Abnormal   Collection Time: 11/09/17  3:55 AM  Result Value Ref Range   Sodium 136 135 - 145 mmol/L   Potassium 4.4 3.5 - 5.1 mmol/L   Chloride 102 101 - 111 mmol/L   CO2 31 22 - 32 mmol/L   Glucose, Bld 143 (H) 65 - 99 mg/dL   BUN 14 6 - 20 mg/dL   Creatinine, Ser 0.42 (L) 0.61 - 1.24 mg/dL   Calcium 7.8 (L) 8.9 - 10.3 mg/dL   GFR calc non Af Amer >60 >60 mL/min   GFR calc Af Amer >60 >60 mL/min    Comment: (NOTE) The eGFR has been calculated using the CKD EPI equation. This calculation has not been validated in all clinical situations. eGFR's persistently <60 mL/min signify possible Chronic Kidney Disease.    Anion gap 3 (L) 5 - 15    Comment: Performed at [facility], [address] [place], [place] [address]    Current Facility-Administered Medications  Medication Dose Route Frequency Provider Last Rate Last Dose  . albuterol (PROVENTIL) (2.5 MG/3ML) 0.083% nebulizer solution 2.5 mg  2.5 mg Nebulization Q6H PRN Arta Silence, MD   2.5 mg at 11/10/17 0510  . allopurinol (ZYLOPRIM) tablet 100 mg   100 mg Oral Daily Gouru, Aruna, MD   100 mg at 11/10/17 0941  . bisacodyl (DULCOLAX) EC tablet 5 mg  5 mg Oral Daily PRN Arta Silence, MD   5 mg at 11/08/17 1244  . bisacodyl (DULCOLAX) suppository 10 mg  10 mg Rectal Daily PRN Fritzi Mandes, MD      . diclofenac sodium (VOLTAREN) 1 % transdermal gel 2 g  2 g Topical QID Gouru, Aruna, MD   2 g at 11/10/17 1351  . enoxaparin (LOVENOX) injection 40 mg  40 mg Subcutaneous Q24H Arta Silence, MD   40 mg at 11/10/17 0805  . feeding supplement (ENSURE ENLIVE) (ENSURE ENLIVE) liquid 237 mL  237 mL Oral TID BM Gouru, Aruna, MD   237 mL at 11/10/17 1350  . fluticasone furoate-vilanterol (BREO ELLIPTA) 200-25 MCG/INH 1 puff  1 puff Inhalation Daily Arta Silence, MD   1 puff at 11/10/17 0944  . folic acid (FOLVITE) tablet 1 mg  1 mg Oral Daily Arta Silence, MD   1 mg at 11/10/17 0940  . gabapentin (NEURONTIN) capsule 900 mg  900 mg Oral TID Arta Silence, MD   900 mg at 11/10/17 1514  . guaiFENesin (MUCINEX) 12 hr tablet 600 mg  600 mg Oral BID Fritzi Mandes, MD   600 mg at 11/10/17 1012  . latanoprost (XALATAN) 0.005 % ophthalmic solution 1 drop  1 drop Both Eyes QHS Arta Silence, MD   1 drop at 11/09/17 2145  . magnesium hydroxide (MILK OF MAGNESIA) suspension 30 mL  30 mL Oral Daily PRN Fritzi Mandes, MD   30 mL at 11/10/17 1218  . mirtazapine (REMERON) tablet 15 mg  15 mg Oral QHS Clapacs, Madie Reno, MD   15 mg at 11/09/17 2136  . morphine (MS CONTIN) 12 hr tablet 15 mg  15 mg Oral Q12H Gouru, Aruna, MD   15 mg at 11/10/17 0940  . multivitamin with minerals tablet 1 tablet  1 tablet Oral Daily Arta Silence, MD   1 tablet at 11/10/17 0941  . multivitamin-lutein (OCUVITE-LUTEIN) capsule 1 capsule  1 capsule Oral Daily Gouru, Aruna, MD   1 capsule at 11/10/17 0941  . ondansetron (ZOFRAN) tablet 4 mg  4 mg Oral Q6H PRN Arta Silence, MD   4 mg at 11/10/17 1514   Or  . ondansetron (ZOFRAN) injection 4 mg  4 mg  Intravenous Q6H PRN Arta Silence, MD      . oxyCODONE (Oxy IR/ROXICODONE) immediate release tablet 5-10 mg  5-10 mg Oral Q4H PRN Arta Silence, MD   10 mg at 11/10/17 1221  . pantoprazole (PROTONIX) EC tablet 40 mg  40 mg Oral Daily Arta Silence, MD   40 mg at 11/10/17 0941  . polyethylene glycol (MIRALAX / GLYCOLAX) packet 17 g  17 g Oral Daily Fritzi Mandes, MD   17 g at 11/10/17 1351  . polyvinyl alcohol (LIQUIFILM TEARS) 1.4 % ophthalmic solution 1 drop  1 drop Both Eyes QID Gouru, Aruna, MD   1 drop at 11/10/17 1350  . [START ON 11/11/2017] predniSONE (DELTASONE) tablet 30 mg  30 mg Oral Q breakfast Fritzi Mandes, MD       Followed by  . [START ON 11/12/2017] predniSONE (DELTASONE) tablet 20 mg  20 mg Oral Q breakfast Fritzi Mandes, MD       Followed by  . [START ON 11/13/2017] predniSONE (DELTASONE) tablet 10 mg  10 mg Oral Q breakfast Fritzi Mandes, MD      . QUEtiapine (SEROQUEL) tablet 300 mg  300 mg Oral QHS Arta Silence, MD   300 mg at 11/09/17 2146  . senna-docusate (Senokot-S) tablet 1 tablet  1 tablet Oral QHS PRN Arta Silence, MD      . senna-docusate (Senokot-S) tablet 1 tablet  1 tablet Oral BID Fritzi Mandes, MD   1 tablet at 11/10/17 0941  . sucralfate (CARAFATE) tablet 1 g  1 g Oral TID WC & HS Arta Silence, MD   1 g at 11/10/17 1218  . tamsulosin (FLOMAX) capsule 0.4 mg  0.4 mg Oral Daily Arta Silence, MD   0.4 mg at 11/10/17 0941  . thiamine (VITAMIN B-1) tablet 100 mg  100 mg Oral Daily Arta Silence, MD   100 mg at 11/10/17 0941  . tiotropium (SPIRIVA) inhalation capsule 18 mcg  18 mcg Inhalation Daily Arta Silence, MD   18 mcg at 11/10/17 0943  . traZODone (DESYREL) tablet 200 mg  200 mg Oral QHS Clapacs, John T, MD   200 mg at 11/09/17 2136  . zolpidem (AMBIEN) tablet 10 mg  10 mg Oral QHS PRN Arta Silence, MD        Musculoskeletal: Strength & Muscle Tone: decreased Gait & Station: unsteady Patient leans:  N/A  Psychiatric Specialty Exam: Physical Exam  Nursing note and vitals reviewed. Constitutional: He appears well-developed and well-nourished.  HENT:  Head: Normocephalic and atraumatic.  Eyes: Pupils are equal, round, and reactive to light. Conjunctivae are normal.  Neck: Normal range of motion.  Cardiovascular: Normal heart sounds.  Respiratory: Effort normal.  GI: Soft.  Musculoskeletal: Normal range of motion.  Neurological: He is alert.  Skin: Skin is warm and dry.     Psychiatric: His speech is normal. Judgment normal. His affect is blunt. He is slowed. Thought content is not paranoid. Cognition  and memory are impaired. He expresses no homicidal and no suicidal ideation.    Review of Systems  Constitutional: Negative.   HENT: Negative.   Eyes: Negative.   Respiratory: Negative.   Cardiovascular: Negative.   Gastrointestinal: Negative.   Musculoskeletal: Negative.   Skin: Negative.   Neurological: Negative.   Psychiatric/Behavioral: Negative for depression, hallucinations, memory loss, substance abuse and suicidal ideas. The patient is nervous/anxious. The patient does not have insomnia.     Blood pressure 123/63, pulse (!) 102, temperature 98.2 F (36.8 C), temperature source Oral, resp. rate 18, height 6' (1.829 m), weight 68.4 kg (150 lb 12.8 oz), SpO2 97 %.Body mass index is 20.45 kg/m.  General Appearance: Casual  Eye Contact:  Minimal  Speech:  Slow  Volume:  Decreased  Mood:  Dysphoric  Affect:  Congruent  Thought Process:  Goal Directed  Orientation:  Full (Time, Place, and Person)  Thought Content:  Logical  Suicidal Thoughts:  No  Homicidal Thoughts:  No  Memory:  Immediate;   Fair Recent;   Fair Remote;   Fair  Judgement:  Fair  Insight:  Fair  Psychomotor Activity:  Decreased  Concentration:  Concentration: Fair  Recall:  [employer] of Knowledge:  Fair  Language:  Fair  Akathisia:  No  Handed:  Right  AIMS (if indicated):     Assets:   Desire for Improvement Resilience  ADL's:  Intact  Cognition:  Impaired,  Mild  Sleep:        Treatment Plan Summary: Daily contact with patient to assess and evaluate symptoms and progress in treatment, Medication management and Plan 57 year old man with a history of substance abuse mood symptoms and probably at least a mild degree of dementia although this probably waxes and wanes.  I found the patient to be alert and oriented today and while I did not do a complete mental status exam for cognition he does show some problems with short-term memory.  Nevertheless the patient is oriented to his situation and able to articulate a plan for the future.  He understands the need for medical treatment and the basics of his current medical condition.  He is not suicidal and not psychotic.  At this point I do not see any reason to question his capacity.  I think he has basic capacity to make decisions although as usual I need to specify that if there were a specific decision that was in question that could be reassessed separately.  However at this point I think in general he should be considered to have normal capacity for decisions.  No indication to make any further changes right now to psychiatric medicine and I will follow-up as needed.  Disposition: No evidence of imminent risk to self or others at present.   Patient does not meet criteria for psychiatric inpatient admission. Supportive therapy provided about ongoing stressors. Discussed crisis plan, support from social network, calling 911, coming to the Emergency Department, and calling Suicide Hotline.  Alethia Berthold, MD 11/10/2017 5:01 PM

## 2017-11-11 LAB — BASIC METABOLIC PANEL
ANION GAP: 7 (ref 5–15)
BUN: 19 mg/dL (ref 6–20)
CALCIUM: 8.8 mg/dL — AB (ref 8.9–10.3)
CHLORIDE: 96 mmol/L — AB (ref 101–111)
CO2: 32 mmol/L (ref 22–32)
Creatinine, Ser: 0.7 mg/dL (ref 0.61–1.24)
GFR calc Af Amer: >60 mL/min (ref >60)
GFR calc non Af Amer: >60 mL/min (ref >60)
GLUCOSE: 112 mg/dL — AB (ref 65–99)
POTASSIUM: 4 mmol/L (ref 3.5–5.1)
Sodium: 135 mmol/L (ref 135–145)

## 2017-11-11 LAB — MAGNESIUM: Magnesium: 1.8 mg/dL (ref 1.7–2.4)

## 2017-11-11 LAB — PHOSPHORUS: Phosphorus: 2.6 mg/dL (ref 2.5–4.6)

## 2017-11-11 MED ORDER — ALLOPURINOL 100 MG PO TABS: 100.0000 mg | ORAL_TABLET | Freq: Every day | ORAL | 0 refills | Status: DC

## 2017-11-11 MED ORDER — PREDNISONE 10 MG PO TABS: 20.0000 mg | ORAL_TABLET | Freq: Every day | ORAL | 0 refills | Status: DC

## 2017-11-11 MED ORDER — PANTOPRAZOLE SODIUM 40 MG PO TBEC: 40.0000 mg | DELAYED_RELEASE_TABLET | Freq: Every day | ORAL | 0 refills | Status: DC

## 2017-11-11 MED ORDER — BISACODYL 10 MG RE SUPP
10.0000 mg | Freq: Once | RECTAL | Status: AC
  Administered 2017-11-11: 10 mg via RECTAL
  Filled 2017-11-11: qty 1

## 2017-11-11 MED ORDER — FLEET ENEMA 7-19 GM/118ML RE ENEM: 1.0000 | ENEMA | Freq: Every day | RECTAL | Status: DC | PRN

## 2017-11-11 MED ORDER — MAGNESIUM CITRATE PO SOLN
1.0000 | Freq: Once | ORAL | Status: AC
  Administered 2017-11-11: 1 via ORAL
  Filled 2017-11-11: qty 296

## 2017-11-11 NOTE — Care Management (Signed)
Attempted to reach significant other regarding home health choice with no answer. Will reattempt at a later time.

## 2017-11-11 NOTE — Progress Notes (Signed)
PT Cancellation Note  Patient Details Name: SRIHAAN MASTRANGELO MRN: [id number] DOB: September 24, 1960   Cancelled Treatment:    Reason Eval/Treat Not Completed: Other (comment).  Attempted to see pt for PT session; however, pt receiving enema due to no BM so RN requested that PT attempt again at a later time.     Encarnacion Chu PT, DPT 11/11/2017, 8:35 AM

## 2017-11-11 NOTE — Progress Notes (Addendum)
Clinical Child psychotherapist (CSW) received a call from Vonna Kotyk [phone] at [facility] who stated that patient can come to the shelter today and requested that he be there before 2 pm. Per Vonna Kotyk the staff leaves at 4 pm. MD and RN aware of above. Taxi voucher provided.   [employer], LCSW [phone]

## 2017-11-11 NOTE — Care Management Note (Signed)
Case Management Note  Patient Details  Name: Perry Bullock MRN: [id number] Date of Birth: [birth year]  Subjective/Objective:  Patient going home with girlfriend. PT recommending home with home health PT.  Offered patient choice of home care providers and he prefers [facility]. [facility] to [facility] with [facility]. No DME needs. Patient will discharge home by car.                  Action/Plan: [facility] for HHPT  Expected Discharge Date:  11/11/17               Expected Discharge Plan:  Home w Home Health Services  In-House Referral:  Clinical Social Work  Discharge planning Services  CM Consult  Post Acute Care Choice:  Home Health Choice offered to:  Patient  DME Arranged:    DME Agency:     HH Arranged:  PT HH Agency:  [facility]  Status of Service:  Completed, signed off  If discussed at Long Length of Stay Meetings, dates discussed:    Additional Comments:  Marily Memos, RN 11/11/2017, 2:08 PM

## 2017-11-11 NOTE — Progress Notes (Signed)
Per nurse and patient his girlfriend [name] will come pick patient up in 2 hours because she does not want him to go to the shelter. RN case manager aware of above.   [employer], LCSW [phone]

## 2017-11-11 NOTE — Progress Notes (Signed)
Patient discharged home with home health as ordered,instructions explained and well understood,vital signs within normal limits,had a bowel movement before discharge,excuted by staff member via wheel chair

## 2017-11-11 NOTE — Progress Notes (Signed)
Per patient he has no where to go and has not been able to get in touch with his friend. Patient requested to go to the [facility] in [place]. Clinical Child psychotherapist (CSW) contacted the [facility] and spoke to [employer] [phone]. William asked to speak to the patient directly. Patient and Chrissie Noa spoke via telephone and it was determined that patient is not "a good fit for the program" per Demetrius Charity asked patient if he could work because it is a requirement to be in the program and patient stated that he can't work and is on disability. CSW made patient aware that he will have to discharge to [facility] homeless shelter today if he can't find a place to go. Patient verbalized his understanding and reported that he has his wallet with his ID in it. Per patient he has never stayed at the shelter before. CSW attempted to contact [facility] 4 times and left 4 voicemails. CSW also contacted the [facility] in [place] and was told they do not have any beds available and are not letting people stay in the lobby.   [employer], LCSW [phone]

## 2017-11-11 NOTE — Progress Notes (Signed)
[facility]  Next of kin: Braydon Kullman (father) [phone]  If patient discharges after hours, please call [phone].   Dimple Casey 11/11/2017, 2:29 PM

## 2017-11-11 NOTE — Discharge Instructions (Signed)
Please speak to Irving Burton intake specialist at [facility] today.   It was a pleasure to take care of you today, and thank you for coming to our emergency department.  If you have any questions or concerns before leaving please ask the nurse to grab me and I'm more than happy to go through your aftercare instructions again.  If you were prescribed any opioid pain medication today such as Norco, Vicodin, Percocet, morphine, hydrocodone, or oxycodone please make sure you do not drive when you are taking this medication as it can alter your ability to drive safely.  If you have any concerns once you are home that you are not improving or are in fact getting worse before you can make it to your follow-up appointment, please do not hesitate to call 911 and come back for further evaluation.  Merrily Brittle, MD  Results for orders placed or performed during the hospital encounter of 11/05/17  Comprehensive metabolic panel  Result Value Ref Range   Sodium 138 135 - 145 mmol/L   Potassium 4.1 3.5 - 5.1 mmol/L   Chloride 105 101 - 111 mmol/L   CO2 28 22 - 32 mmol/L   Glucose, Bld 113 (H) 65 - 99 mg/dL   BUN 7 6 - 20 mg/dL   Creatinine, Ser 4.09 0.61 - 1.24 mg/dL   Calcium 7.8 (L) 8.9 - 10.3 mg/dL   Total Protein 5.2 (L) 6.5 - 8.1 g/dL   Albumin 2.1 (L) 3.5 - 5.0 g/dL   AST 35 15 - 41 U/L   ALT 19 17 - 63 U/L   Alkaline Phosphatase 145 (H) 38 - 126 U/L   Total Bilirubin 1.1 0.3 - 1.2 mg/dL   GFR calc non Af Amer >60 >60 mL/min   GFR calc Af Amer >60 >60 mL/min   Anion gap 5 5 - 15  CBC with Differential  Result Value Ref Range   WBC 8.2 3.8 - 10.6 K/uL   RBC 3.59 (L) 4.40 - 5.90 MIL/uL   Hemoglobin 12.6 (L) 13.0 - 18.0 g/dL   HCT 81.1 (L) 91.4 - 78.2 %   MCV 102.9 (H) 80.0 - 100.0 fL   MCH 35.2 (H) 26.0 - 34.0 pg   MCHC 34.2 32.0 - 36.0 g/dL   RDW 95.6 (H) 21.3 - 08.6 %   Platelets 326 150 - 440 K/uL   Neutrophils Relative % 71 %   Neutro Abs 5.9 1.4 - 6.5 K/uL   Lymphocytes  Relative 12 %   Lymphs Abs 1.0 1.0 - 3.6 K/uL   Monocytes Relative 14 %   Monocytes Absolute 1.1 (H) 0.2 - 1.0 K/uL   Eosinophils Relative 2 %   Eosinophils Absolute 0.2 0 - 0.7 K/uL   Basophils Relative 1 %   Basophils Absolute 0.0 0 - 0.1 K/uL  Protime-INR  Result Value Ref Range   Prothrombin Time 13.1 11.4 - 15.2 seconds   INR 1.00   CK  Result Value Ref Range   Total CK 86 49 - 397 U/L  Beta-hydroxybutyric acid  Result Value Ref Range   Beta-Hydroxybutyric Acid 0.45 (H) 0.05 - 0.27 mmol/L  Ethanol  Result Value Ref Range   Alcohol, Ethyl (B) <10 <10 mg/dL  Troponin I  Result Value Ref Range   Troponin I <0.03 <0.03 ng/mL  Brain natriuretic peptide  Result Value Ref Range   B Natriuretic Peptide 162.0 (H) 0.0 - 100.0 pg/mL  Lipase, blood  Result Value Ref Range  Lipase 21 11 - 51 U/L  Ammonia  Result Value Ref Range   Ammonia 15 9 - 35 umol/L   US Abdomen Limited Ruq  Result Date: 11/05/2017 CLINICAL DATA:  57 year old male with abdominal pain and diarrhea. EXAM: ULTRASOUND ABDOMEN LIMITED RIGHT UPPER QUADRANT COMPARISON:  CT of the abdomen pelvis dated 04/23/2015 FINDINGS: Gallbladder: There are small gallstones. There is no gallbladder wall thickening or pericholecystic fluid. Negative sonographic Murphy's sign. Common bile duct: Diameter: 5 mm Liver: Diffuse increased liver echogenicity consistent with fatty infiltration. Portal vein is patent on color Doppler imaging with normal direction of blood flow towards the liver. IMPRESSION: Cholelithiasis without sonographic evidence of acute cholecystitis. Fatty liver. Electronically Signed   By: Elgie Collard M.D.   On: 11/05/2017 04:08

## 2017-11-11 NOTE — Discharge Summary (Signed)
[facility] [place] at [facility]   PATIENT NAME: Perry Bullock    MR#:  [id number]  DATE OF BIRTH:  1961/04/23  DATE OF ADMISSION:  11/05/2017 ADMITTING PHYSICIAN: Barbaraann Rondo, MD  DATE OF DISCHARGE: 11/11/2017  PRIMARY CARE PHYSICIAN: [facility], [facility]    ADMISSION DIAGNOSIS:  Alcohol abuse [F10.10] Cirrhosis (HCC) [K74.60] PVD (peripheral vascular disease) (HCC) [I73.9] Weakness [R53.1] Acute cystitis with hematuria [N30.01]  DISCHARGE DIAGNOSIS:  Acute on Chronic low back pain PVD Depression COPD/Asthma/tobcco abuse ETOH abuse  SECONDARY DIAGNOSIS:   Past Medical History:  Diagnosis Date  . Anxiety   . Arthritis   . Asthma   . Bipolar 1 disorder (HCC)   . Blind right eye   . Chronic back pain    Diverticulosis  . Chronic headaches   . Convulsion (HCC)    once a month when stands up too quickly  . COPD (chronic obstructive pulmonary disease) (HCC)   . DDD (degenerative disc disease), lumbar   . Diverticulitis   . GERD (gastroesophageal reflux disease)   . Glaucoma   . Hepatitis    hepatitis C  . Pneumonia   . PVD (peripheral vascular disease) (HCC)    lower extremities reddened and feet swollen  . Seasonal allergies   . Shortness of breath dyspnea     HOSPITAL COURSE:   1.Acute on chronic low back pain seen by orthopedics Dr. Odis Luster recommended prednisone tapering dose and outpatient follow-up in a week after discharge for steroid injections Voltaren gel topical applied and pain medication as needed  2.Suspected UTI: Tachycardic U/A (+) UTI. UCx will species. D/ced Ceftriaxone qD  3.EtOH abuse: Drinks daily -Does not quantify. EtOH < 10. CIWA, thiamine, folate, MVI. -scoring 0 at present   4.COPD/asthma: DuoNebs PRN. C/w Dulera, Spiriva.  5.B/L LE erythema/swelling Pt w/ dusky redness of B/L LE (below knees).  - ABIs-nondiagnostic secondary to incompressible vessel calcifications -vascular  consutl for claudication and rest pain -- patient was seen by vascular Dr. Festus Barren. CT angiogram does not show significant PVD. He has mild PVD which can be managed medically. -patient has long standing history of smoking.  6.Depression acute; psych consult placed, seen by Dr. Toni Amend who thinks it is dysphoria.  -Trazodone dose was increased to 200 mg at night Seroquel is at maximum dose 300 mg. -Recommending to add mirtazapine at night 15 mg to help with appetite and sleep. - Patient does not meet criteria to be placed inpatient psychiatry floor -re-eval by Dr rakesh--pt at present not able to make medical decisions. - Seen by Dr. Toni Amend  And per his evaluation pt DOES have the capacity to make his decisions  7.d/c planning Seems to be homeless at this time, consult social worker for discharge planning--pt will go to shelter  8.Constipation due to narcotics -enema and mag citrate  Pt will d/c today. Spoke with him   CONSULTS OBTAINED:  Treatment Team:  Barbaraann Rondo, MD Clapacs, Jackquline Denmark, MD Aundria Rud, MD Schnier, Latina Craver, MD  DRUG ALLERGIES:   Allergies  Allergen Reactions  . Ace Inhibitors Swelling    DISCHARGE MEDICATIONS:   Allergies as of 11/11/2017      Reactions   Ace Inhibitors Swelling      Medication List    STOP taking these medications   enoxaparin 40 MG/0.4ML injection Commonly known as:  LOVENOX   morphine 30 MG 12 hr tablet Commonly known as:  MS CONTIN   nicotine 21 mg/24hr  patch Commonly known as:  NICODERM CQ - dosed in mg/24 hours   tamsulosin 0.4 MG Caps capsule Commonly known as:  FLOMAX   THERATEARS 0.25 % Soln Generic drug:  Carboxymethylcellulose Sodium   tiotropium 18 MCG inhalation capsule Commonly known as:  SPIRIVA   zolpidem 10 MG tablet Commonly known as:  AMBIEN     TAKE these medications   albuterol (2.5 MG/3ML) 0.083% nebulizer solution Commonly known as:  PROVENTIL Take 3 mLs (2.5 mg total) by  nebulization every 6 (six) hours as needed for wheezing or shortness of breath.   albuterol 108 (90 Base) MCG/ACT inhaler Commonly known as:  PROAIR HFA Inhale 2 puffs into the lungs every 6 (six) hours as needed for wheezing or shortness of breath.   allopurinol 100 MG tablet Commonly known as:  ZYLOPRIM Take 1 tablet (100 mg total) by mouth daily. Start taking on:  11/12/2017   cetirizine 10 MG tablet Commonly known as:  ZYRTEC Take 10 mg by mouth daily.   EMBEDA 30-1.2 MG Cpcr Generic drug:  Morphine-Naltrexone Take 1 capsule by mouth daily.   fluticasone 50 MCG/ACT nasal spray Commonly known as:  FLONASE Place 2 sprays into both nostrils as needed.   gabapentin 600 MG tablet Commonly known as:  NEURONTIN Take 600 mg by mouth 3 (three) times daily.   mometasone-formoterol 200-5 MCG/ACT Aero Commonly known as:  DULERA Inhale 2 puffs into the lungs 2 (two) times daily.   ondansetron 4 MG tablet Commonly known as:  ZOFRAN Take 1 tablet (4 mg total) by mouth every 8 (eight) hours as needed for nausea.   oxyCODONE 5 MG immediate release tablet Commonly known as:  Oxy IR/ROXICODONE Take 1-2 tablets (5-10 mg total) by mouth every 4 (four) hours as needed for moderate pain.   pantoprazole 40 MG tablet Commonly known as:  PROTONIX Take 1 tablet (40 mg total) by mouth daily.   predniSONE 10 MG tablet Commonly known as:  DELTASONE Take 2 tablets (20 mg total) by mouth daily with breakfast. Take 20 mg daily taper by  then stop Start taking on:  11/12/2017   QUEtiapine 200 MG tablet Commonly known as:  SEROQUEL Take 200 mg by mouth at bedtime.   traZODone 100 MG tablet Commonly known as:  DESYREL Take 300 mg by mouth at bedtime.       If you experience worsening of your admission symptoms, develop shortness of breath, life threatening emergency, suicidal or homicidal thoughts you must seek medical attention immediately by calling 911 or calling your MD immediately   if symptoms less severe.  You Must read complete instructions/literature along with all the possible adverse reactions/side effects for all the Medicines you take and that have been prescribed to you. Take any new Medicines after you have completely understood and accept all the possible adverse reactions/side effects.   Please note  You were cared for by a hospitalist during your hospital stay. If you have any questions about your discharge medications or the care you received while you were in the hospital after you are discharged, you can call the unit and asked to speak with the hospitalist on call if the hospitalist that took care of you is not available. Once you are discharged, your primary care physician will handle any further medical issues. Please note that NO REFILLS for any discharge medications will be authorized once you are discharged, as it is imperative that you return to your primary care physician (or establish a relationship  with a primary care physician if you do not have one) for your aftercare needs so that they can reassess your need for medications and monitor your lab values. Today   SUBJECTIVE  constipation   VITAL SIGNS:  Blood pressure (!) 121/98, pulse (!) 108, temperature 99 F (37.2 C), temperature source Oral, resp. rate 20, height 6' (1.829 m), weight 68.4 kg (150 lb 12.8 oz), SpO2 98 %.  I/O:  No intake or output data in the 24 hours ending 11/11/17 1055  PHYSICAL EXAMINATION:  GENERAL:  57 y.o.-year-old patient lying in the bed with no acute distress.  EYES: Pupils equal, round, reactive to light and accommodation. No scleral icterus. Extraocular muscles intact.  HEENT: Head atraumatic, normocephalic. Oropharynx and nasopharynx clear.  NECK:  Supple, no jugular venous distention. No thyroid enlargement, no tenderness.  LUNGS: Normal breath sounds bilaterally, no wheezing, rales,rhonchi or crepitation. No use of accessory muscles of respiration.   CARDIOVASCULAR: S1, S2 normal. No murmurs, rubs, or gallops.  ABDOMEN: Soft, non-tender, non-distended. Bowel sounds present. No organomegaly or mass.  EXTREMITIES: No pedal edema, cyanosis, or clubbing.  NEUROLOGIC: Cranial nerves II through XII are intact. Muscle strength 5/5 in all extremities. Sensation intact. Gait not checked.  PSYCHIATRIC: The patient is alert and oriented x 3.  SKIN: No obvious rash, lesion, or ulcer.   DATA REVIEW:   CBC  Recent Labs  Lab 11/05/17 0023  WBC 8.2  HGB 12.6*  HCT 36.9*  PLT 326    Chemistries  Recent Labs  Lab 11/05/17 0023  11/11/17 0440  NA 138   < > 135  K 4.1   < > 4.0  CL 105   < > 96*  CO2 28   < > 32  GLUCOSE 113*   < > 112*  BUN 7   < > 19  CREATININE 0.62   < > 0.70  CALCIUM 7.8*   < > 8.8*  MG  --    < > 1.8  AST 35  --   --   ALT 19  --   --   ALKPHOS 145*  --   --   BILITOT 1.1  --   --    < > = values in this interval not displayed.    Microbiology Results   Recent Results (from the past 240 hour(s))  Urine culture     Status: Abnormal   Collection Time: 11/05/17 12:25 AM  Result Value Ref Range Status   Specimen Description   Final    URINE, RANDOM Performed at [facility], [address], [place], [place] [address]    Special Requests   Final    NONE Performed at [facility], [address] [place], [place] [address]    Culture MULTIPLE SPECIES PRESENT, SUGGEST RECOLLECTION (A)  Final   Report Status 11/06/2017 FINAL  Final    RADIOLOGY:  No results found.   Management plans discussed with the patient, family and they are in agreement.  CODE STATUS:     Code Status Orders  (From admission, onward)        Start     Ordered   11/05/17 0617  Full code  Continuous     11/05/17 0617    Code Status History    Date Active Date Inactive Code Status Order ID Comments User Context   01/15/2017 2319 01/19/2017 2122 Full Code [id number]  Tonye Royalty, DO Inpatient    04/22/2015 0245 04/25/2015 1642 Full  Code [id number]  Wyatt Haste, MD ED   04/03/2015 0732 04/06/2015 1743 Full Code [id number]  Crissie Figures, MD Inpatient      TOTAL TIME TAKING CARE OF THIS PATIENT: *40* minutes.    Enedina Finner M.D on 11/11/2017 at 10:55 AM  Between 7am to 6pm - Pager - [phone] After 6pm go to [email or web address] - Social research officer, government  [facility]  Office  [phone]  CC: Primary care physician; [facility], [facility]

## 2017-11-18 ENCOUNTER — Telehealth: Payer: Self-pay

## 2017-11-18 NOTE — Telephone Encounter (Signed)
EMMI Follow-up: It was noted on the report that patient had a question about scheduled follow-up appointment.  I called the number provided on the report and got Perry Bullock' VM. I left a message for Perry Bullock to call me at my number. Will try again.

## 2017-11-20 ENCOUNTER — Telehealth: Payer: Self-pay

## 2017-11-20 NOTE — Telephone Encounter (Signed)
EMMI Follow-up: 2nd attempt to reach patient.  Perry Bullock's number is listed for patient contact so left another VM for Perry Bullock to contact me if he had any questions.

## 2017-12-04 ENCOUNTER — Other Ambulatory Visit: Payer: Self-pay

## 2017-12-07 ENCOUNTER — Other Ambulatory Visit: Payer: Self-pay

## 2017-12-07 MED ORDER — TIOTROPIUM BROMIDE MONOHYDRATE 18 MCG IN CAPS: 18.0000 ug | ORAL_CAPSULE | Freq: Every day | RESPIRATORY_TRACT | 0 refills | Status: DC

## 2017-12-25 ENCOUNTER — Ambulatory Visit: Payer: Self-pay | Admitting: Nurse Practitioner

## 2018-01-07 ENCOUNTER — Ambulatory Visit: Payer: Self-pay | Admitting: Internal Medicine

## 2018-03-25 ENCOUNTER — Other Ambulatory Visit: Payer: Self-pay | Admitting: Family Medicine

## 2018-03-25 DIAGNOSIS — K7460 Unspecified cirrhosis of liver: Secondary | ICD-10-CM

## 2018-04-07 ENCOUNTER — Ambulatory Visit
Admission: RE | Admit: 2018-04-07 | Discharge: 2018-04-07 | Disposition: A | Discharge status: Home / Self Care | Payer: Medicaid Other | Source: Ambulatory Visit | Attending: Family Medicine | Admitting: Family Medicine

## 2018-04-07 DIAGNOSIS — K760 Fatty (change of) liver, not elsewhere classified: Secondary | ICD-10-CM | POA: Insufficient documentation

## 2018-04-07 DIAGNOSIS — K8020 Calculus of gallbladder without cholecystitis without obstruction: Secondary | ICD-10-CM | POA: Diagnosis not present

## 2018-04-07 DIAGNOSIS — K7460 Unspecified cirrhosis of liver: Secondary | ICD-10-CM | POA: Insufficient documentation

## 2018-04-08 ENCOUNTER — Encounter: Payer: Self-pay | Admitting: Adult Health

## 2018-04-08 ENCOUNTER — Ambulatory Visit: Payer: Medicaid Other | Admitting: Adult Health

## 2018-04-08 VITALS — BP 126/76 | HR 87 | Resp 16 | Ht 72.0 in | Wt 142.6 lb

## 2018-04-08 DIAGNOSIS — J069 Acute upper respiratory infection, unspecified: Secondary | ICD-10-CM | POA: Diagnosis not present

## 2018-04-08 DIAGNOSIS — G4733 Obstructive sleep apnea (adult) (pediatric): Secondary | ICD-10-CM

## 2018-04-08 DIAGNOSIS — J449 Chronic obstructive pulmonary disease, unspecified: Secondary | ICD-10-CM

## 2018-04-08 DIAGNOSIS — F17219 Nicotine dependence, cigarettes, with unspecified nicotine-induced disorders: Secondary | ICD-10-CM

## 2018-04-08 MED ORDER — ALBUTEROL SULFATE HFA 108 (90 BASE) MCG/ACT IN AERS: 2.0000 | INHALATION_SPRAY | Freq: Four times a day (QID) | RESPIRATORY_TRACT | 11 refills | Status: DC | PRN

## 2018-04-08 MED ORDER — NICOTINE 14 MG/24HR TD PT24: 14.0000 mg | MEDICATED_PATCH | Freq: Every day | TRANSDERMAL | 0 refills | Status: DC

## 2018-04-08 MED ORDER — LEVOFLOXACIN 500 MG PO TABS: 500.0000 mg | ORAL_TABLET | Freq: Every day | ORAL | 0 refills | Status: DC

## 2018-04-08 MED ORDER — NICOTINE 21 MG/24HR TD PT24: 21.0000 mg | MEDICATED_PATCH | Freq: Every day | TRANSDERMAL | 0 refills | Status: DC

## 2018-04-08 MED ORDER — TIOTROPIUM BROMIDE MONOHYDRATE 18 MCG IN CAPS: 18.0000 ug | ORAL_CAPSULE | Freq: Every day | RESPIRATORY_TRACT | 0 refills | Status: DC

## 2018-04-08 MED ORDER — ALBUTEROL SULFATE (2.5 MG/3ML) 0.083% IN NEBU: 2.5000 mg | INHALATION_SOLUTION | Freq: Four times a day (QID) | RESPIRATORY_TRACT | 2 refills | Status: DC | PRN

## 2018-04-08 MED ORDER — MOMETASONE FURO-FORMOTEROL FUM 200-5 MCG/ACT IN AERO: 2.0000 | INHALATION_SPRAY | Freq: Two times a day (BID) | RESPIRATORY_TRACT | 11 refills | Status: DC

## 2018-04-08 MED ORDER — PREDNISONE 10 MG PO TABS: ORAL_TABLET | ORAL | 0 refills | Status: DC

## 2018-04-08 NOTE — Progress Notes (Signed)
[facility] [address] [place], [place] [address]  Pulmonary Sleep Medicine   Office Visit Note  Patient Name: Perry Bullock DOB: October 05, 1960 MRN [id number]  Date of Service: 04/08/2018  Complaints/HPI: Pt here for follow up on copd, and asthma. He reports he has generally been doing well.  He reports DOE at times even with supplemental oxygen. He has had sinus drainage for 2 weeks.  He reports blowing his nose, sinus pressure and pain.  Reports now he is coughing, and he describes it as a productive cough.    ROS  General: (-) fever, (-) chills, (-) night sweats, (-) weakness Skin: (-) rashes, (-) itching,. Eyes: (-) visual changes, (-) redness, (-) itching. Nose and Sinuses: (-) nasal stuffiness or itchiness, (-) postnasal drip, (-) nosebleeds, (-) sinus trouble. Mouth and Throat: (-) sore throat, (-) hoarseness. Neck: (-) swollen glands, (-) enlarged thyroid, (-) neck pain. Respiratory: + cough, (-) bloody sputum, - shortness of breath, - wheezing. Cardiovascular: - ankle swelling, (-) chest pain. Lymphatic: (-) lymph node enlargement. Neurologic: (-) numbness, (-) tingling. Psychiatric: (-) anxiety, (-) depression   Current Medication: Outpatient Encounter Medications as of 04/08/2018  Medication Sig  . albuterol (PROAIR HFA) 108 (90 Base) MCG/ACT inhaler Inhale 2 puffs into the lungs every 6 (six) hours as needed for wheezing or shortness of breath.  Marland Kitchen albuterol (PROVENTIL) (2.5 MG/3ML) 0.083% nebulizer solution Take 3 mLs (2.5 mg total) by nebulization every 6 (six) hours as needed for wheezing or shortness of breath.  . allopurinol (ZYLOPRIM) 100 MG tablet Take 1 tablet (100 mg total) by mouth daily.  . cetirizine (ZYRTEC) 10 MG tablet Take 10 mg by mouth daily.  . EMBEDA 30-1.2 MG CPCR Take 1 capsule by mouth daily.  . fluticasone (FLONASE) 50 MCG/ACT nasal spray Place 2 sprays into both nostrils as needed.   . gabapentin (NEURONTIN) 600 MG tablet Take  600 mg by mouth 3 (three) times daily.   . mometasone-formoterol (DULERA) 200-5 MCG/ACT AERO Inhale 2 puffs into the lungs 2 (two) times daily.  . ondansetron (ZOFRAN) 4 MG tablet Take 1 tablet (4 mg total) by mouth every 8 (eight) hours as needed for nausea.  Marland Kitchen oxyCODONE (OXY IR/ROXICODONE) 5 MG immediate release tablet Take 1-2 tablets (5-10 mg total) by mouth every 4 (four) hours as needed for moderate pain.  . pantoprazole (PROTONIX) 40 MG tablet Take 1 tablet (40 mg total) by mouth daily.  . QUEtiapine (SEROQUEL) 200 MG tablet Take 200 mg by mouth at bedtime.   Marland Kitchen tiotropium (SPIRIVA HANDIHALER) 18 MCG inhalation capsule Place 1 capsule (18 mcg total) into inhaler and inhale daily.  . traZODone (DESYREL) 100 MG tablet Take 300 mg by mouth at bedtime.   . [DISCONTINUED] albuterol (PROAIR HFA) 108 (90 Base) MCG/ACT inhaler Inhale 2 puffs into the lungs every 6 (six) hours as needed for wheezing or shortness of breath.  . [DISCONTINUED] albuterol (PROVENTIL) (2.5 MG/3ML) 0.083% nebulizer solution Take 3 mLs (2.5 mg total) by nebulization every 6 (six) hours as needed for wheezing or shortness of breath.  . [DISCONTINUED] mometasone-formoterol (DULERA) 200-5 MCG/ACT AERO Inhale 2 puffs into the lungs 2 (two) times daily.  . [DISCONTINUED] tiotropium (SPIRIVA HANDIHALER) 18 MCG inhalation capsule Place 1 capsule (18 mcg total) into inhaler and inhale daily.  Marland Kitchen levofloxacin (LEVAQUIN) 500 MG tablet Take 1 tablet (500 mg total) by mouth daily.  . nicotine (NICODERM CQ) 14 mg/24hr patch Place 1 patch (14 mg total) onto the  skin daily.  . nicotine (NICODERM CQ) 21 mg/24hr patch Place 1 patch (21 mg total) onto the skin daily.  . predniSONE (DELTASONE) 10 MG tablet Take 2 tablets (20 mg total) by mouth daily with breakfast. Take 20 mg daily taper by 10mg  then stop (Patient not taking: Reported on 04/08/2018)  . predniSONE (DELTASONE) 10 MG tablet Use per dose pack   No facility-administered encounter  medications on file as of 04/08/2018.     Surgical History: Past Surgical History:  Procedure Laterality Date  . EYE SURGERY Right   . INTRAMEDULLARY (IM) NAIL INTERTROCHANTERIC Right 01/16/2017   Procedure: INTRAMEDULLARY (IM) NAIL INTERTROCHANTRIC;  Surgeon: Christena Flake, MD;  Location: [facility] ORS;  Service: Orthopedics;  Laterality: Right;  . INTUBATION-ENDOTRACHEAL WITH TRACHEOSTOMY STANDBY N/A 04/22/2015   Procedure: INTUBATION-ENDOTRACHEAL WITH TRACHEOSTOMY STANDBY;  Surgeon: Linus Salmons, MD;  Location: [facility] ORS;  Service: ENT;  Laterality: N/A;  . JOINT REPLACEMENT Left    Lt shoulder  . MANDIBLE SURGERY      Medical History: Past Medical History:  Diagnosis Date  . Anxiety   . Arthritis   . Asthma   . Bipolar 1 disorder (HCC)   . Blind right eye   . Broken hip (HCC)   . Chronic back pain    Diverticulosis  . Chronic headaches   . Convulsion (HCC)    once a month when stands up too quickly  . COPD (chronic obstructive pulmonary disease) (HCC)   . DDD (degenerative disc disease), lumbar   . Diverticulitis   . GERD (gastroesophageal reflux disease)   . Glaucoma   . Hepatitis    hepatitis C  . Pneumonia   . PVD (peripheral vascular disease) (HCC)    lower extremities reddened and feet swollen  . Seasonal allergies   . Shortness of breath dyspnea     Family History: Family History  Problem Relation Age of Onset  . Breast cancer Mother   . Hypertension Father   . Diabetes Father     Social History: Social History   Socioeconomic History  . Marital status: Single    Spouse name: Not on file  . Number of children: Not on file  . Years of education: Not on file  . Highest education level: Not on file  Occupational History  . Not on file  Social Needs  . Financial resource strain: Not on file  . Food insecurity:    Worry: Not on file    Inability: Not on file  . Transportation needs:    Medical: Not on file    Non-medical: Not on file  Tobacco  Use  . Smoking status: Current Every Day Smoker    Packs/day: 0.00    Years: 35.00    Pack years: 0.00    Types: Cigarettes  . Smokeless tobacco: Never Used  . Tobacco comment: 2-3 cigs daily--02/27/16  Substance and Sexual Activity  . Alcohol use: Yes    Alcohol/week: 3.0 standard drinks    Types: 3 Cans of beer per week    Comment: pt is cutting back to 3-4 daily  . Drug use: No  . Sexual activity: Yes    Birth control/protection: None  Lifestyle  . Physical activity:    Days per week: Not on file    Minutes per session: Not on file  . Stress: Not on file  Relationships  . Social connections:    Talks on phone: Not on file    Gets together: Not on file  Attends religious service: Not on file    Active member of club or organization: Not on file    Attends meetings of clubs or organizations: Not on file    Relationship status: Not on file  . Intimate partner violence:    Fear of current or ex partner: Not on file    Emotionally abused: Not on file    Physically abused: Not on file    Forced sexual activity: Not on file  Other Topics Concern  . Not on file  Social History Narrative  . Not on file    Vital Signs: Blood pressure 126/76, pulse 87, resp. rate 16, height 6' (1.829 m), weight 142 lb 9.6 oz (64.7 kg), SpO2 93 %.  Examination: General Appearance: The patient is well-developed, well-nourished, and in no distress. Skin: Gross inspection of skin unremarkable. Head: normocephalic, no gross deformities. Eyes: no gross deformities noted. ENT: ears appear grossly normal no exudates. Neck: Supple. No thyromegaly. No LAD. Respiratory: diminished breath sounds, with slight friction rub noted. . Cardiovascular: Normal S1 and S2 without murmur or rub. Extremities: No cyanosis. pulses are equal. Neurologic: Alert and oriented. No involuntary movements.  LABS: No results found for this or any previous visit (from the past 2160 hour(s)).  Radiology: US Abdomen  Complete  Result Date: 04/07/2018 CLINICAL DATA:  Hepatic cirrhosis. EXAM: ABDOMEN ULTRASOUND COMPLETE COMPARISON:  CT scan 11/09/2017 FINDINGS: Gallbladder: Single mobile gallstone noted in the gallbladder. This measures 7 mm. No gallbladder wall thickening, pericholecystic fluid or sonographic Murphy sign to suggest acute cholecystitis. Common bile duct: Diameter: 4.6 mm Liver: Somewhat nodular liver contour consistent with cirrhosis. Diffuse increased echogenicity is noted suggesting fatty infiltration with focal fatty sparing near the gallbladder fossa. No worrisome hepatic lesions or intrahepatic biliary dilatation. Portal vein is patent on color Doppler imaging with normal direction of blood flow towards the liver. IVC: Normal caliber. Pancreas: Visualized portion unremarkable. Spleen: Normal size.  No focal lesions. Right Kidney: Length: 10.8 cm. Normal renal cortical thickness and echogenicity without focal lesions or hydronephrosis. Left Kidney: Length: 9.8 cm. Normal renal cortical thickness and echogenicity without focal lesions or hydronephrosis. Abdominal aorta: Atherosclerotic changes but no aneurysm. Other findings: No ascites. IMPRESSION: 1. Cholelithiasis without sonographic findings for acute cholecystitis. 2. Cirrhotic changes involving the liver along with diffuse fatty infiltration and focal fatty sparing in the gallbladder fossa. No worrisome hepatic lesions. 3. No splenomegaly or ascites. Electronically Signed   By: Rudie Meyer M.D.   On: 04/07/2018 17:02    US Abdomen Complete  Result Date: 04/07/2018 CLINICAL DATA:  Hepatic cirrhosis. EXAM: ABDOMEN ULTRASOUND COMPLETE COMPARISON:  CT scan 11/09/2017 FINDINGS: Gallbladder: Single mobile gallstone noted in the gallbladder. This measures 7 mm. No gallbladder wall thickening, pericholecystic fluid or sonographic Murphy sign to suggest acute cholecystitis. Common bile duct: Diameter: 4.6 mm Liver: Somewhat nodular liver contour  consistent with cirrhosis. Diffuse increased echogenicity is noted suggesting fatty infiltration with focal fatty sparing near the gallbladder fossa. No worrisome hepatic lesions or intrahepatic biliary dilatation. Portal vein is patent on color Doppler imaging with normal direction of blood flow towards the liver. IVC: Normal caliber. Pancreas: Visualized portion unremarkable. Spleen: Normal size.  No focal lesions. Right Kidney: Length: 10.8 cm. Normal renal cortical thickness and echogenicity without focal lesions or hydronephrosis. Left Kidney: Length: 9.8 cm. Normal renal cortical thickness and echogenicity without focal lesions or hydronephrosis. Abdominal aorta: Atherosclerotic changes but no aneurysm. Other findings: No ascites. IMPRESSION: 1. Cholelithiasis without sonographic findings for  acute cholecystitis. 2. Cirrhotic changes involving the liver along with diffuse fatty infiltration and focal fatty sparing in the gallbladder fossa. No worrisome hepatic lesions. 3. No splenomegaly or ascites. Electronically Signed   By: Rudie Meyer M.D.   On: 04/07/2018 17:02    US Abdomen Complete  Result Date: 04/07/2018 CLINICAL DATA:  Hepatic cirrhosis. EXAM: ABDOMEN ULTRASOUND COMPLETE COMPARISON:  CT scan 11/09/2017 FINDINGS: Gallbladder: Single mobile gallstone noted in the gallbladder. This measures 7 mm. No gallbladder wall thickening, pericholecystic fluid or sonographic Murphy sign to suggest acute cholecystitis. Common bile duct: Diameter: 4.6 mm Liver: Somewhat nodular liver contour consistent with cirrhosis. Diffuse increased echogenicity is noted suggesting fatty infiltration with focal fatty sparing near the gallbladder fossa. No worrisome hepatic lesions or intrahepatic biliary dilatation. Portal vein is patent on color Doppler imaging with normal direction of blood flow towards the liver. IVC: Normal caliber. Pancreas: Visualized portion unremarkable. Spleen: Normal size.  No focal lesions.  Right Kidney: Length: 10.8 cm. Normal renal cortical thickness and echogenicity without focal lesions or hydronephrosis. Left Kidney: Length: 9.8 cm. Normal renal cortical thickness and echogenicity without focal lesions or hydronephrosis. Abdominal aorta: Atherosclerotic changes but no aneurysm. Other findings: No ascites. IMPRESSION: 1. Cholelithiasis without sonographic findings for acute cholecystitis. 2. Cirrhotic changes involving the liver along with diffuse fatty infiltration and focal fatty sparing in the gallbladder fossa. No worrisome hepatic lesions. 3. No splenomegaly or ascites. Electronically Signed   By: Rudie Meyer M.D.   On: 04/07/2018 17:02      Assessment and Plan: Patient Active Problem List   Diagnosis Date Noted  . Complicated UTI (urinary tract infection) 11/05/2017  . Pressure injury of skin 11/05/2017  . Dysthymia 11/05/2017  . Closed intertrochanteric fracture of hip, right, initial encounter (HCC) 01/15/2017  . Substance induced mood disorder (HCC) 03/05/2016  . Alcohol abuse 03/05/2016  . Cocaine abuse (HCC) 03/05/2016  . Angioedema 04/22/2015  . Abscess of upper lobe of right lung without pneumonia (HCC) 04/03/2015  . COPD (chronic obstructive pulmonary disease) (HCC) 04/03/2015  . Hep C w/o coma, chronic (HCC) 04/03/2015  . Bipolar disorder (HCC) 04/03/2015   1. Upper respiratory tract infection, unspecified type Patient has a long history of multiple lung infections.  Will treat at this time with Levaquin and prednisone Dosepak. - levofloxacin (LEVAQUIN) 500 MG tablet; Take 1 tablet (500 mg total) by mouth daily.  Dispense: 10 tablet; Refill: 0  2. Chronic obstructive pulmonary disease, unspecified COPD type [facility]) Patient requests multiple refills for COPD medication.  Obliged at this time.  He reports good compliance with his medications and states that his breathing has been okay up until the last 2 weeks when this upper respiratory process started. -  predniSONE (DELTASONE) 10 MG tablet; Use per dose pack  Dispense: 21 tablet; Refill: 0 - albuterol (PROAIR HFA) 108 (90 Base) MCG/ACT inhaler; Inhale 2 puffs into the lungs every 6 (six) hours as needed for wheezing or shortness of breath.  Dispense: 1 Inhaler; Refill: 11 - albuterol (PROVENTIL) (2.5 MG/3ML) 0.083% nebulizer solution; Take 3 mLs (2.5 mg total) by nebulization every 6 (six) hours as needed for wheezing or shortness of breath.  Dispense: 75 mL; Refill: 2 - mometasone-formoterol (DULERA) 200-5 MCG/ACT AERO; Inhale 2 puffs into the lungs 2 (two) times daily.  Dispense: 1 Inhaler; Refill: 11 - tiotropium (SPIRIVA HANDIHALER) 18 MCG inhalation capsule; Place 1 capsule (18 mcg total) into inhaler and inhale daily.  Dispense: 30 capsule; Refill: 0  3.  OSA (obstructive sleep apnea) Pt has been diagnosed via sleep study by his PCP.  Does not have CPAP at this time.   4. Cigarette nicotine dependence with nicotine-induced disorder Discussed smoking cessation with patient once again.  He reports he is down to 4 cigarettes a day.  Prescriptions for NicoDerm 14 mg and 21 g provided at this time we will see patient back in 6 months for follow-up. Smoking cessation counseling: 1. Pt acknowledges the risks of long term smoking, she will try to quite smoking. 2. Options for different medications including nicotine products, chewing gum, patch etc, Wellbutrin and Chantix is discussed 3. Goal and date of compete cessation is discussed 4. Total time spent in smoking cessation is 15 min.  - nicotine (NICODERM CQ) 21 mg/24hr patch; Place 1 patch (21 mg total) onto the skin daily.  Dispense: 28 patch; Refill: 0 - nicotine (NICODERM CQ) 14 mg/24hr patch; Place 1 patch (14 mg total) onto the skin daily.  Dispense: 28 patch; Refill: 0   General Counseling: I have discussed the findings of the evaluation and examination with Jillyn Hidden.  I have also discussed any further diagnostic evaluation thatmay be needed  or ordered today. Zavian verbalizes understanding of the findings of todays visit. We also reviewed his medications today and discussed drug interactions and side effects including but not limited excessive drowsiness and altered mental states. We also discussed that there is always a risk not just to him but also people around him. he has been encouraged to call the office with any questions or concerns that should arise related to todays visit.    Time spent: 25 This patient was seen by Blima Ledger AGNP-C in Collaboration with Dr. Freda Munro as a part of collaborative care agreement.   I have personally obtained a history, examined the patient, evaluated laboratory and imaging results, formulated the assessment and plan and placed orders.    Yevonne Pax, MD [facility] Pulmonary and [facility] Care Sleep medicine

## 2018-04-08 NOTE — Patient Instructions (Signed)
Coping with Quitting Smoking Quitting smoking is a physical and mental challenge. You will face cravings, withdrawal symptoms, and temptation. Before quitting, work with your health care provider to make a plan that can help you cope. Preparation can help you quit and keep you from giving in. How can I cope with cravings? Cravings usually last for 5-10 minutes. If you get through it, the craving will pass. Consider taking the following actions to help you cope with cravings:  Keep your mouth busy: ? Chew sugar-free gum. ? Suck on hard candies or a straw. ? Brush your teeth.  Keep your hands and body busy: ? Immediately change to a different activity when you feel a craving. ? Squeeze or play with a ball. ? Do an activity or a hobby, like making bead jewelry, practicing needlepoint, or working with wood. ? Mix up your normal routine. ? Take a short exercise break. Go for a quick walk or run up and down stairs. ? Spend time in public places where smoking is not allowed.  Focus on doing something kind or helpful for someone else.  Call a friend or family member to talk during a craving.  Join a support group.  Call a quit line, such as 1-800-QUIT-NOW.  Talk with your health care provider about medicines that might help you cope with cravings and make quitting easier for you.  How can I deal with withdrawal symptoms? Your body may experience negative effects as it tries to get used to not having nicotine in the system. These effects are called withdrawal symptoms. They may include:  Feeling hungrier than normal.  Trouble concentrating.  Irritability.  Trouble sleeping.  Feeling depressed.  Restlessness and agitation.  Craving a cigarette.  To manage withdrawal symptoms:  Avoid places, people, and activities that trigger your cravings.  Remember why you want to quit.  Get plenty of sleep.  Avoid coffee and other caffeinated drinks. These may worsen some of your  symptoms.  How can I handle social situations? Social situations can be difficult when you are quitting smoking, especially in the first few weeks. To manage this, you can:  Avoid parties, bars, and other social situations where people might be smoking.  Avoid alcohol.  Leave right away if you have the urge to smoke.  Explain to your family and friends that you are quitting smoking. Ask for understanding and support.  Plan activities with friends or family where smoking is not an option.  What are some ways I can cope with stress? Wanting to smoke may cause stress, and stress can make you want to smoke. Find ways to manage your stress. Relaxation techniques can help. For example:  Breathe slowly and deeply, in through your nose and out through your mouth.  Listen to soothing, relaxing music.  Talk with a family member or friend about your stress.  Light a candle.  Soak in a bath or take a shower.  Think about a peaceful place.  What are some ways I can prevent weight gain? Be aware that many people gain weight after they quit smoking. However, not everyone does. To keep from gaining weight, have a plan in place before you quit and stick to the plan after you quit. Your plan should include:  Having healthy snacks. When you have a craving, it may help to: ? Eat plain popcorn, crunchy carrots, celery, or other cut vegetables. ? Chew sugar-free gum.  Changing how you eat: ? Eat small portion sizes at meals. ?   Eat 4-6 small meals throughout the day instead of 1-2 large meals a day. ? Be mindful when you eat. Do not watch television or do other things that might distract you as you eat.  Exercising regularly: ? Make time to exercise each day. If you do not have time for a long workout, do short bouts of exercise for 5-10 minutes several times a day. ? Do some form of strengthening exercise, like weight lifting, and some form of aerobic exercise, like running or  swimming.  Drinking plenty of water or other low-calorie or no-calorie drinks. Drink 6-8 glasses of water daily, or as much as instructed by your health care provider.  Summary  Quitting smoking is a physical and mental challenge. You will face cravings, withdrawal symptoms, and temptation to smoke again. Preparation can help you as you go through these challenges.  You can cope with cravings by keeping your mouth busy (such as by chewing gum), keeping your body and hands busy, and making calls to family, friends, or a helpline for people who want to quit smoking.  You can cope with withdrawal symptoms by avoiding places where people smoke, avoiding drinks with caffeine, and getting plenty of rest.  Ask your health care provider about the different ways to prevent weight gain, avoid stress, and handle social situations. This information is not intended to replace advice given to you by your health care provider. Make sure you discuss any questions you have with your health care provider. Document Released: 06/13/2016 Document Revised: 06/13/2016 Document Reviewed: 06/13/2016 Elsevier Interactive Patient Education  2018 [employer] Chronic Obstructive Pulmonary Disease Chronic obstructive pulmonary disease (COPD) is a long-term (chronic) lung problem. When you have COPD, it is hard for air to get in and out of your lungs. The way your lungs work will never return to normal. Usually the condition gets worse over time. There are things you can do to keep yourself as healthy as possible. Your doctor may treat your condition with:  Medicines.  Quitting smoking, if you smoke.  Rehabilitation. This may involve a team of specialists.  Oxygen.  Exercise and changes to your diet.  Lung surgery.  Comfort measures (palliative care).  Follow these instructions at home: Medicines  Take over-the-counter and prescription medicines only as told by your doctor.  Talk to your doctor before  taking any cough or allergy medicines. You may need to avoid medicines that cause your lungs to be dry. Lifestyle  If you smoke, stop. Smoking makes the problem worse. If you need help quitting, ask your doctor.  Avoid being around things that make your breathing worse. This may include smoke, chemicals, and fumes.  Stay active, but remember to also rest.  Learn and use tips on how to relax.  Make sure you get enough sleep. Most adults need at least 7 hours a night.  Eat healthy foods. Eat smaller meals more often. Rest before meals. Controlled breathing  Learn and use tips on how to control your breathing as told by your doctor. Try: ? Breathing in (inhaling) through your nose for 1 second. Then, pucker your lips and breath out (exhale) through your lips for 2 seconds. ? Putting one hand on your belly (abdomen). Breathe in slowly through your nose for 1 second. Your hand on your belly should move out. Pucker your lips and breathe out slowly through your lips. Your hand on your belly should move in as you breathe out. Controlled coughing  Learn and use controlled coughing  to clear mucus from your lungs. The steps are: 1. Lean your head a little forward. 2. Breathe in deeply. 3. Try to hold your breath for 3 seconds. 4. Keep your mouth slightly open while coughing 2 times. 5. Spit any mucus out into a tissue. 6. Rest and do the steps again 1 or 2 times as needed. General instructions  Make sure you get all the shots (vaccines) that your doctor recommends. Ask your doctor about a flu shot and a pneumonia shot.  Use oxygen therapy and therapy to help improve your lungs (pulmonary rehabilitation) if told by your doctor. If you need home oxygen therapy, ask your doctor if you should buy a tool to measure your oxygen level (oximeter).  Make a COPD action plan with your doctor. This helps you know what to do if you feel worse than usual.  Manage any other conditions you have as told by  your doctor.  Avoid going outside when it is very hot, cold, or humid.  Avoid people who have a sickness you can catch (contagious).  Keep all follow-up visits as told by your doctor. This is important. Contact a doctor if:  You cough up more mucus than usual.  There is a change in the color or thickness of the mucus.  It is harder to breathe than usual.  Your breathing is faster than usual.  You have trouble sleeping.  You need to use your medicines more often than usual.  You have trouble doing your normal activities such as getting dressed or walking around the house. Get help right away if:  You have shortness of breath while resting.  You have shortness of breath that stops you from: ? Being able to talk. ? Doing normal activities.  Your chest hurts for longer than 5 minutes.  Your skin color is more blue than usual.  Your pulse oximeter shows that you have low oxygen for longer than 5 minutes.  You have a fever.  You feel too tired to breathe normally. Summary  Chronic obstructive pulmonary disease (COPD) is a long-term lung problem.  The way your lungs work will never return to normal. Usually the condition gets worse over time. There are things you can do to keep yourself as healthy as possible.  Take over-the-counter and prescription medicines only as told by your doctor.  If you smoke, stop. Smoking makes the problem worse. This information is not intended to replace advice given to you by your health care provider. Make sure you discuss any questions you have with your health care provider. Document Released: 12/03/2007 Document Revised: 11/22/2015 Document Reviewed: 02/10/2013 Elsevier Interactive Patient Education  2017 [employer]

## 2018-04-09 ENCOUNTER — Telehealth: Payer: Self-pay

## 2018-04-09 NOTE — Telephone Encounter (Signed)
Called patient and left message and asked him to call back, he will need to come in for a walk before getting approved for [employer] oxygen. I have paperwork saved to complete once comes in for test to be faxed.Beth

## 2018-06-20 IMAGING — CR DG LUMBAR SPINE 2-3V
1 series · 3 of 3 positions shown · non-contrast
Comparison: Abdomen CT dated 04/23/2015.

CLINICAL DATA: Continued low back pain since falling off a roof 2
years ago.

EXAM:
LUMBAR SPINE - 2-3 VIEW

[Series 1: dg lumbar spine 2-3 views · 0.14mm/px · 3 of 3 slices shown]
[im 1/3]
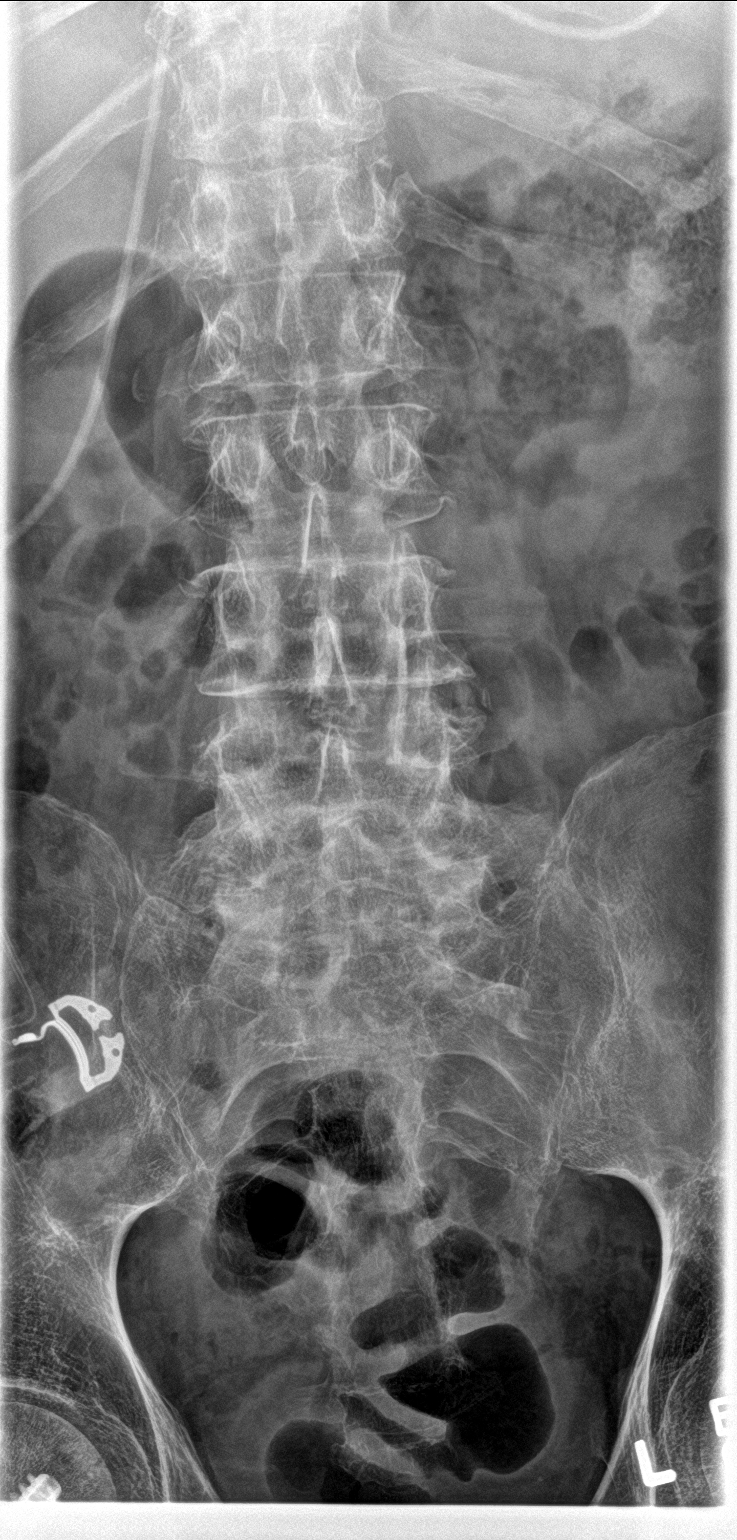
[im 2/3]
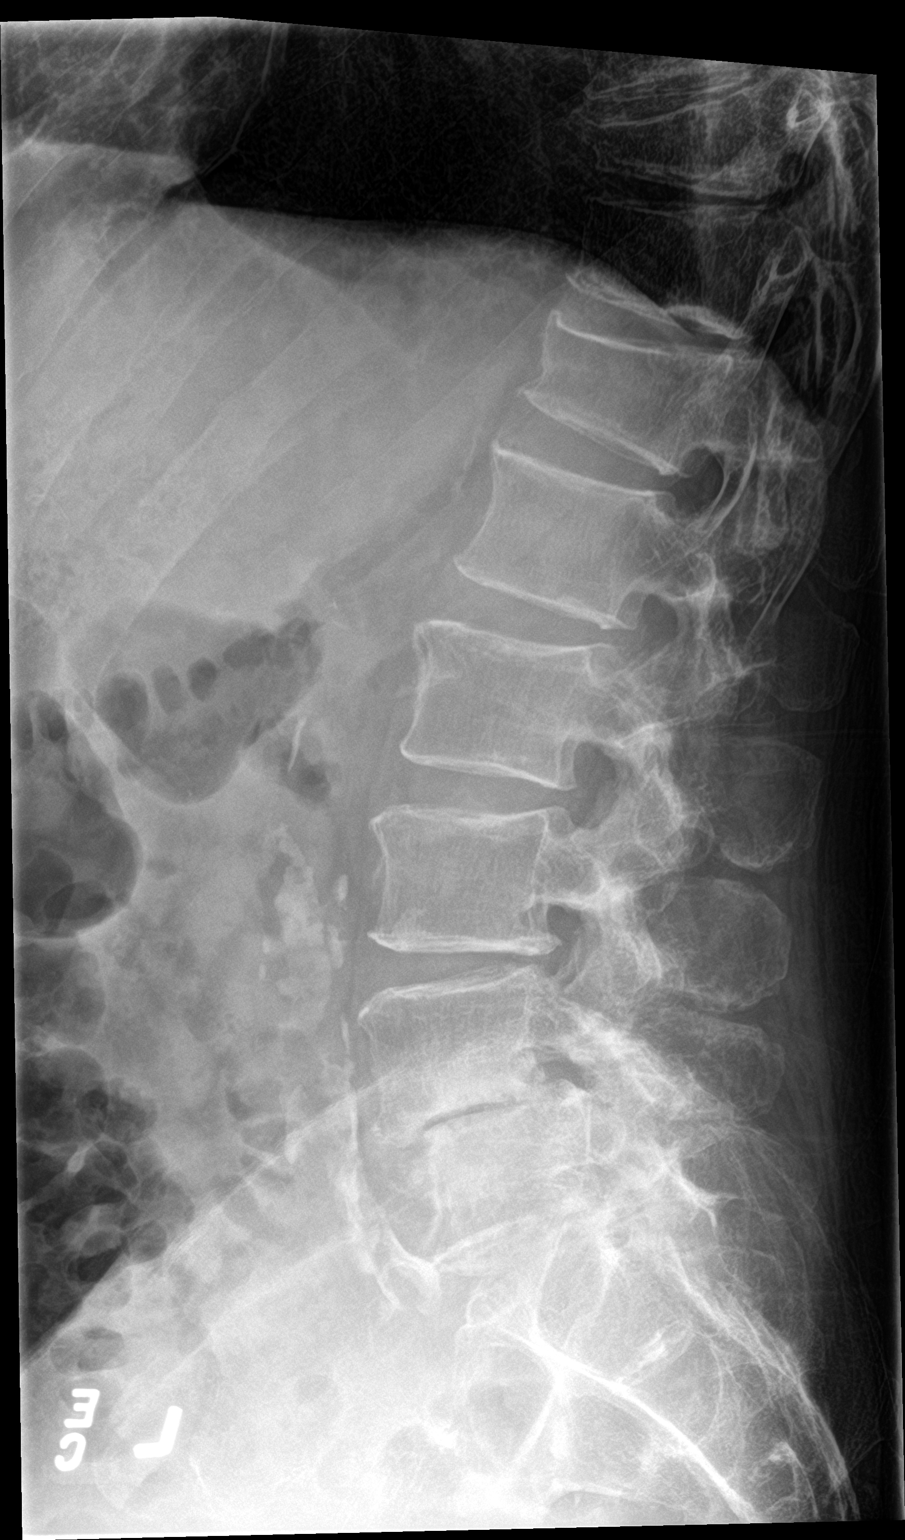
[im 3/3]
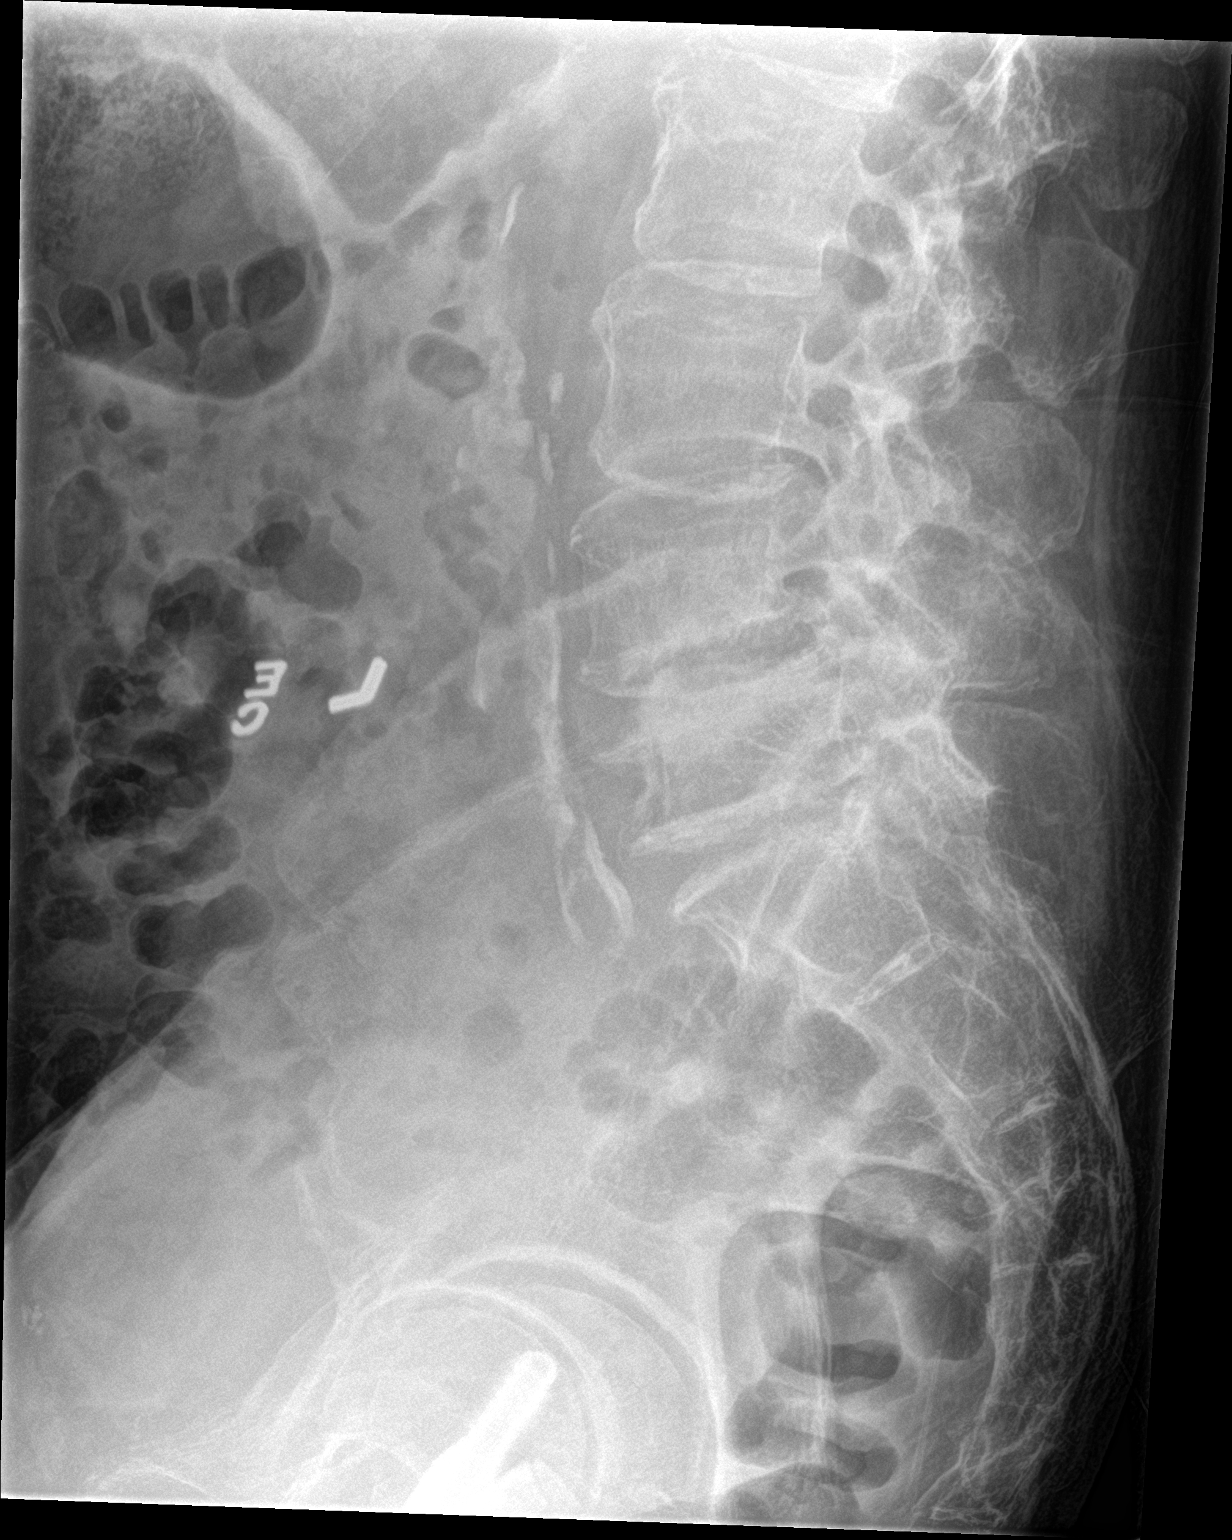

[3 of 3 positions shown; findings below may reference images not displayed]

FINDINGS: Five non-rib-bearing lumbar vertebrae. Marked disc space narrowing
and discogenic sclerosis are again demonstrated at the L4-5 level
with progressive anterolisthesis at that level. Mild anterior and
posterior spur formation at multiple levels of the lumbar lower
thoracic spine. Stable 30% T12 superior endplate compression
deformity with no acute fracture lines. Interval 50% T10 vertebral
compression deformity with no acute fracture lines visualized. No
significant bony retropulsion visualized. Atheromatous arterial
calcifications.
IMPRESSION: 1. Marked degenerative changes at the L4-5 level with progressive
anterolisthesis at that level, currently grade 1-2.
2. Mild lumbar and lower thoracic spine degenerative changes.
3. Stable old T12 compression deformity and interval old T10
compression deformity.

## 2018-07-06 ENCOUNTER — Other Ambulatory Visit: Payer: Self-pay | Admitting: Adult Health

## 2018-07-06 DIAGNOSIS — F17219 Nicotine dependence, cigarettes, with unspecified nicotine-induced disorders: Secondary | ICD-10-CM

## 2018-07-13 ENCOUNTER — Other Ambulatory Visit: Payer: Self-pay | Admitting: Internal Medicine

## 2018-07-13 DIAGNOSIS — J449 Chronic obstructive pulmonary disease, unspecified: Secondary | ICD-10-CM

## 2018-08-09 ENCOUNTER — Other Ambulatory Visit: Payer: Self-pay

## 2018-08-09 DIAGNOSIS — J449 Chronic obstructive pulmonary disease, unspecified: Secondary | ICD-10-CM

## 2018-08-09 MED ORDER — TIOTROPIUM BROMIDE MONOHYDRATE 18 MCG IN CAPS: ORAL_CAPSULE | RESPIRATORY_TRACT | 1 refills | Status: DC

## 2018-09-28 ENCOUNTER — Other Ambulatory Visit: Payer: Self-pay | Admitting: Internal Medicine

## 2018-09-28 DIAGNOSIS — F17219 Nicotine dependence, cigarettes, with unspecified nicotine-induced disorders: Secondary | ICD-10-CM

## 2018-09-29 NOTE — Telephone Encounter (Signed)
Ok to send pulmonary pt

## 2018-10-06 ENCOUNTER — Other Ambulatory Visit: Payer: Self-pay | Admitting: Adult Health

## 2018-10-06 DIAGNOSIS — J449 Chronic obstructive pulmonary disease, unspecified: Secondary | ICD-10-CM

## 2018-10-12 ENCOUNTER — Other Ambulatory Visit: Payer: Self-pay

## 2018-10-12 ENCOUNTER — Encounter: Payer: Self-pay | Admitting: Internal Medicine

## 2018-10-12 ENCOUNTER — Ambulatory Visit: Payer: Medicaid Other | Admitting: Internal Medicine

## 2018-10-12 VITALS — Ht 72.0 in | Wt 149.0 lb

## 2018-10-12 DIAGNOSIS — J449 Chronic obstructive pulmonary disease, unspecified: Secondary | ICD-10-CM

## 2018-10-12 DIAGNOSIS — J4530 Mild persistent asthma, uncomplicated: Secondary | ICD-10-CM | POA: Diagnosis not present

## 2018-10-12 MED ORDER — TIOTROPIUM BROMIDE MONOHYDRATE 18 MCG IN CAPS
ORAL_CAPSULE | RESPIRATORY_TRACT | 2 refills | Status: DC
Start: 2018-10-12 — End: 2019-03-31

## 2018-10-12 MED ORDER — MOMETASONE FURO-FORMOTEROL FUM 200-5 MCG/ACT IN AERO: 2.0000 | INHALATION_SPRAY | Freq: Two times a day (BID) | RESPIRATORY_TRACT | 11 refills | Status: DC

## 2018-10-12 MED ORDER — ALBUTEROL SULFATE HFA 108 (90 BASE) MCG/ACT IN AERS: 2.0000 | INHALATION_SPRAY | Freq: Four times a day (QID) | RESPIRATORY_TRACT | 11 refills | Status: DC | PRN

## 2018-10-12 MED ORDER — ALBUTEROL SULFATE (2.5 MG/3ML) 0.083% IN NEBU: 2.5000 mg | INHALATION_SOLUTION | Freq: Four times a day (QID) | RESPIRATORY_TRACT | 2 refills | Status: DC | PRN

## 2018-10-12 NOTE — Patient Instructions (Signed)
Asthma, Adult    Asthma is a long-term (chronic) condition in which the airways get tight and narrow. The airways are the breathing passages that lead from the nose and mouth down into the lungs. A person with asthma will have times when symptoms get worse. These are called asthma attacks. They can cause coughing, whistling sounds when you breathe (wheezing), shortness of breath, and chest pain. They can make it hard to breathe. There is no cure for asthma, but medicines and lifestyle changes can help control it.  There are many things that can bring on an asthma attack or make asthma symptoms worse (triggers). Common triggers include:  · Mold.  · Dust.  · Cigarette smoke.  · Cockroaches.  · Things that can cause allergy symptoms (allergens). These include animal skin flakes (dander) and pollen from trees or grass.  · Things that pollute the air. These may include household cleaners, wood smoke, smog, or chemical odors.  · Cold air, weather changes, and wind.  · Crying or laughing hard.  · Stress.  · Certain medicines or drugs.  · Certain foods such as dried fruit, potato chips, and grape juice.  · Infections, such as a cold or the flu.  · Certain medical conditions or diseases.  · Exercise or tiring activities.  Asthma may be treated with medicines and by staying away from the things that cause asthma attacks. Types of medicines may include:  · Controller medicines. These help prevent asthma symptoms. They are usually taken every day.  · Fast-acting reliever or rescue medicines. These quickly relieve asthma symptoms. They are used as needed and provide short-term relief.  · Allergy medicines if your attacks are brought on by allergens.  · Medicines to help control the body's defense (immune) system.  Follow these instructions at home:  Avoiding triggers in your home  · Change your heating and air conditioning filter often.  · Limit your use of fireplaces and wood stoves.  · Get rid of pests (such as roaches and  mice) and their droppings.  · Throw away plants if you see mold on them.  · Clean your floors. Dust regularly. Use cleaning products that do not smell.  · Have someone vacuum when you are not home. Use a vacuum cleaner with a HEPA filter if possible.  · Replace carpet with wood, tile, or vinyl flooring. Carpet can trap animal skin flakes and dust.  · Use allergy-proof pillows, mattress covers, and box spring covers.  · Wash bed sheets and blankets every week in hot water. Dry them in a dryer.  · Keep your bedroom free of any triggers.   · Avoid pets and keep windows closed when things that cause allergy symptoms are in the air.  · Use blankets that are made of polyester or cotton.  · Clean bathrooms and kitchens with bleach. If possible, have someone repaint the walls in these rooms with mold-resistant paint. Keep out of the rooms that are being cleaned and painted.  · Wash your hands often with soap and water. If soap and water are not available, use hand sanitizer.  · Do not allow anyone to smoke in your home.  General instructions  · Take over-the-counter and prescription medicines only as told by your doctor.  ? Talk with your doctor if you have questions about how or when to take your medicines.  ? Make note if you need to use your medicines more often than usual.  · Do not use any products that   contain nicotine or tobacco, such as cigarettes and e-cigarettes. If you need help quitting, ask your doctor.  · Stay away from secondhand smoke.  · Avoid doing things outdoors when allergen counts are high and when air quality is low.  · Wear a ski mask when doing outdoor activities in the winter. The mask should cover your nose and mouth. Exercise indoors on cold days if you can.  · Warm up before you exercise. Take time to cool down after exercise.  · Use a peak flow meter as told by your doctor. A peak flow meter is a tool that measures how well the lungs are working.  · Keep track of the peak flow meter's readings.  Write them down.  · Follow your asthma action plan. This is a written plan for taking care of your asthma and treating your attacks.  · Make sure you get all the shots (vaccines) that your doctor recommends. Ask your doctor about a flu shot and a pneumonia shot.  · Keep all follow-up visits as told by your doctor. This is important.  Contact a doctor if:  · You have wheezing, shortness of breath, or a cough even while taking medicine to prevent attacks.  · The mucus you cough up (sputum) is thicker than usual.  · The mucus you cough up changes from clear or white to yellow, green, gray, or bloody.  · You have problems from the medicine you are taking, such as:  ? A rash.  ? Itching.  ? Swelling.  ? Trouble breathing.  · You need reliever medicines more than 2-3 times a week.  · Your peak flow reading is still at 50-79% of your personal best after following the action plan for 1 hour.  · You have a fever.  Get help right away if:  · You seem to be worse and are not responding to medicine during an asthma attack.  · You are short of breath even at rest.  · You get short of breath when doing very little activity.  · You have trouble eating, drinking, or talking.  · You have chest pain or tightness.  · You have a fast heartbeat.  · Your lips or fingernails start to turn blue.  · You are light-headed or dizzy, or you faint.  · Your peak flow is less than 50% of your personal best.  · You feel too tired to breathe normally.  Summary  · Asthma is a long-term (chronic) condition in which the airways get tight and narrow. An asthma attack can make it hard to breathe.  · Asthma cannot be cured, but medicines and lifestyle changes can help control it.  · Make sure you understand how to avoid triggers and how and when to use your medicines.  This information is not intended to replace advice given to you by your health care provider. Make sure you discuss any questions you have with your health care provider.  Document  Released: 12/03/2007 Document Revised: 07/21/2016 Document Reviewed: 07/21/2016  Elsevier Interactive Patient Education © 2019 [employer]

## 2018-10-12 NOTE — Progress Notes (Signed)
[facility] [address] [place], [place] [address]  Internal MEDICINE  Telephone Visit  Patient Name: Perry Bullock  [id number]  [id number]  Date of Service: 10/12/2018  I connected with the patient at 255 by telephone and verified the patients identity using two identifiers.   I discussed the limitations, risks, security and privacy concerns of performing an evaluation and management service by telephone and the availability of in person appointments. I also discussed with the patient that there may be a patient responsible charge related to the service.  The patient expressed understanding and agrees to proceed.    Chief Complaint  Patient presents with  . Medical Management of Chronic Issues    video visit, 6 mohtn follow up  . Asthma  . COPD    HPI Pt reporting his allergies have been giving him trouble. He is taking flonase and zyrtec. This is helping mostly, but he still has some days that are bad.  He is currently doing well, denies any issues with his COPD or Asthma.  He is requesting some refills on medications at this time.      Current Medication: Outpatient Encounter Medications as of 10/12/2018  Medication Sig  . albuterol (PROAIR HFA) 108 (90 Base) MCG/ACT inhaler Inhale 2 puffs into the lungs every 6 (six) hours as needed for wheezing or shortness of breath.  Marland Kitchen albuterol (PROVENTIL) (2.5 MG/3ML) 0.083% nebulizer solution Take 3 mLs (2.5 mg total) by nebulization every 6 (six) hours as needed for wheezing or shortness of breath.  . allopurinol (ZYLOPRIM) 100 MG tablet Take 1 tablet (100 mg total) by mouth daily.  . cetirizine (ZYRTEC) 10 MG tablet Take 10 mg by mouth daily.  . EMBEDA 30-1.2 MG CPCR Take 1 capsule by mouth daily.  . fluticasone (FLONASE) 50 MCG/ACT nasal spray Place 2 sprays into both nostrils as needed.   . gabapentin (NEURONTIN) 600 MG tablet Take 600 mg by mouth 3 (three) times daily.   Marland Kitchen levofloxacin (LEVAQUIN) 500 MG tablet Take 1  tablet (500 mg total) by mouth daily.  . mometasone-formoterol (DULERA) 200-5 MCG/ACT AERO Inhale 2 puffs into the lungs 2 (two) times daily.  . nicotine (NICODERM CQ - DOSED IN MG/24 HOURS) 21 mg/24hr patch APPLY 1 PATCH TO SKIN ONCE DAILY  . nicotine (NICODERM CQ) 14 mg/24hr patch Place 1 patch (14 mg total) onto the skin daily.  . ondansetron (ZOFRAN) 4 MG tablet Take 1 tablet (4 mg total) by mouth every 8 (eight) hours as needed for nausea.  Marland Kitchen oxyCODONE (OXY IR/ROXICODONE) 5 MG immediate release tablet Take 1-2 tablets (5-10 mg total) by mouth every 4 (four) hours as needed for moderate pain.  . pantoprazole (PROTONIX) 40 MG tablet Take 1 tablet (40 mg total) by mouth daily.  . predniSONE (DELTASONE) 10 MG tablet Take 2 tablets (20 mg total) by mouth daily with breakfast. Take 20 mg daily taper by  then stop (Patient not taking: Reported on 04/08/2018)  . predniSONE (DELTASONE) 10 MG tablet Use per dose pack  . QUEtiapine (SEROQUEL) 200 MG tablet Take 200 mg by mouth at bedtime.   Marland Kitchen tiotropium (SPIRIVA HANDIHALER) 18 MCG inhalation capsule INHALE THE CONTENTS OF ONE CAPSULE BY MOUTH ONCE DAILY  . traZODone (DESYREL) 100 MG tablet Take 300 mg by mouth at bedtime.   . [DISCONTINUED] albuterol (PROAIR HFA) 108 (90 Base) MCG/ACT inhaler Inhale 2 puffs into the lungs every 6 (six) hours as needed for wheezing or shortness of breath.  . [  DISCONTINUED] albuterol (PROVENTIL) (2.5 MG/3ML) 0.083% nebulizer solution Take 3 mLs (2.5 mg total) by nebulization every 6 (six) hours as needed for wheezing or shortness of breath.  . [DISCONTINUED] mometasone-formoterol (DULERA) 200-5 MCG/ACT AERO Inhale 2 puffs into the lungs 2 (two) times daily.  . [DISCONTINUED] tiotropium (SPIRIVA HANDIHALER) 18 MCG inhalation capsule INHALE THE CONTENTS OF ONE CAPSULE BY MOUTH ONCE DAILY   No facility-administered encounter medications on file as of 10/12/2018.     Surgical History: Past Surgical History:  Procedure  Laterality Date  . EYE SURGERY Right   . INTRAMEDULLARY (IM) NAIL INTERTROCHANTERIC Right 01/16/2017   Procedure: INTRAMEDULLARY (IM) NAIL INTERTROCHANTRIC;  Surgeon: Christena Flake, MD;  Location: [facility] ORS;  Service: Orthopedics;  Laterality: Right;  . INTUBATION-ENDOTRACHEAL WITH TRACHEOSTOMY STANDBY N/A 04/22/2015   Procedure: INTUBATION-ENDOTRACHEAL WITH TRACHEOSTOMY STANDBY;  Surgeon: Linus Salmons, MD;  Location: [facility] ORS;  Service: ENT;  Laterality: N/A;  . JOINT REPLACEMENT Left    Lt shoulder  . MANDIBLE SURGERY      Medical History: Past Medical History:  Diagnosis Date  . Anxiety   . Arthritis   . Asthma   . Bipolar 1 disorder (HCC)   . Blind right eye   . Broken hip (HCC)   . Chronic back pain    Diverticulosis  . Chronic headaches   . Convulsion (HCC)    once a month when stands up too quickly  . COPD (chronic obstructive pulmonary disease) (HCC)   . DDD (degenerative disc disease), lumbar   . Diverticulitis   . GERD (gastroesophageal reflux disease)   . Glaucoma   . Hepatitis    hepatitis C  . Pneumonia   . PVD (peripheral vascular disease) (HCC)    lower extremities reddened and feet swollen  . Seasonal allergies   . Shortness of breath dyspnea     Family History: Family History  Problem Relation Age of Onset  . Breast cancer Mother   . Hypertension Father   . Diabetes Father     Social History   Socioeconomic History  . Marital status: Single    Spouse name: Not on file  . Number of children: Not on file  . Years of education: Not on file  . Highest education level: Not on file  Occupational History  . Not on file  Social Needs  . Financial resource strain: Not on file  . Food insecurity:    Worry: Not on file    Inability: Not on file  . Transportation needs:    Medical: Not on file    Non-medical: Not on file  Tobacco Use  . Smoking status: Current Every Day Smoker    Packs/day: 0.00    Years: 35.00    Pack years: 0.00    Types:  Cigarettes  . Smokeless tobacco: Never Used  . Tobacco comment: 1 cigs daily--02/27/16, pt has patch  Substance and Sexual Activity  . Alcohol use: Yes    Alcohol/week: 3.0 standard drinks    Types: 3 Cans of beer per week    Comment: pt is cutting back to 3-4 daily  . Drug use: No  . Sexual activity: Yes    Birth control/protection: None  Lifestyle  . Physical activity:    Days per week: Not on file    Minutes per session: Not on file  . Stress: Not on file  Relationships  . Social connections:    Talks on phone: Not on file    Gets together:  Not on file    Attends religious service: Not on file    Active member of club or organization: Not on file    Attends meetings of clubs or organizations: Not on file    Relationship status: Not on file  . Intimate partner violence:    Fear of current or ex partner: Not on file    Emotionally abused: Not on file    Physically abused: Not on file    Forced sexual activity: Not on file  Other Topics Concern  . Not on file  Social History Narrative  . Not on file      Review of Systems  Constitutional: Negative.  Negative for chills, fatigue and unexpected weight change.  HENT: Negative.  Negative for congestion, rhinorrhea, sneezing and sore throat.   Eyes: Negative for redness.  Respiratory: Negative.  Negative for cough, chest tightness and shortness of breath.   Cardiovascular: Negative.  Negative for chest pain and palpitations.  Gastrointestinal: Negative.  Negative for abdominal pain, constipation, diarrhea, nausea and vomiting.  Endocrine: Negative.   Genitourinary: Negative.  Negative for dysuria and frequency.  Musculoskeletal: Negative.  Negative for arthralgias, back pain, joint swelling and neck pain.  Skin: Negative.  Negative for rash.  Allergic/Immunologic: Negative.   Neurological: Negative.  Negative for tremors and numbness.  Hematological: Negative for adenopathy. Does not bruise/bleed easily.   Psychiatric/Behavioral: Negative.  Negative for behavioral problems, sleep disturbance and suicidal ideas. The patient is not nervous/anxious.     Vital Signs: Ht 6' (1.829 m)   Wt 149 lb (67.6 kg)   BMI 20.21 kg/m    Observation/Objective: Speaking in complete sentences.  NAD noted.      Assessment/Plan: 1. Mild persistent asthma, unspecified whether complicated Ordered patient new nebulizer, as his is not working.  - For home use only DME Nebulizer machine  2. Chronic obstructive pulmonary disease, unspecified COPD type (HCC) Refilled patients medications as follows.  Denies other needs.  Continue to use medications as prescribed.  - mometasone-formoterol (DULERA) 200-5 MCG/ACT AERO; Inhale 2 puffs into the lungs 2 (two) times daily.  Dispense: 1 Inhaler; Refill: 11 - albuterol (PROAIR HFA) 108 (90 Base) MCG/ACT inhaler; Inhale 2 puffs into the lungs every 6 (six) hours as needed for wheezing or shortness of breath.  Dispense: 1 Inhaler; Refill: 11 - tiotropium (SPIRIVA HANDIHALER) 18 MCG inhalation capsule; INHALE THE CONTENTS OF ONE CAPSULE BY MOUTH ONCE DAILY  Dispense: 30 capsule; Refill: 2 - albuterol (PROVENTIL) (2.5 MG/3ML) 0.083% nebulizer solution; Take 3 mLs (2.5 mg total) by nebulization every 6 (six) hours as needed for wheezing or shortness of breath.  Dispense: 75 mL; Refill: 2  General Counseling: Perry Bullock verbalizes understanding of the findings of today's phone visit and agrees with plan of treatment. I have discussed any further diagnostic evaluation that may be needed or ordered today. We also reviewed his medications today. he has been encouraged to call the office with any questions or concerns that should arise related to todays visit.    Orders Placed This Encounter  Procedures  . For home use only DME Nebulizer machine    Meds ordered this encounter  Medications  . mometasone-formoterol (DULERA) 200-5 MCG/ACT AERO    Sig: Inhale 2 puffs into the lungs 2  (two) times daily.    Dispense:  1 Inhaler    Refill:  11  . albuterol (PROAIR HFA) 108 (90 Base) MCG/ACT inhaler    Sig: Inhale 2 puffs into the lungs every  6 (six) hours as needed for wheezing or shortness of breath.    Dispense:  1 Inhaler    Refill:  11  . tiotropium (SPIRIVA HANDIHALER) 18 MCG inhalation capsule    Sig: INHALE THE CONTENTS OF ONE CAPSULE BY MOUTH ONCE DAILY    Dispense:  30 capsule    Refill:  2  . albuterol (PROVENTIL) (2.5 MG/3ML) 0.083% nebulizer solution    Sig: Take 3 mLs (2.5 mg total) by nebulization every 6 (six) hours as needed for wheezing or shortness of breath.    Dispense:  75 mL    Refill:  2    Time spent: 11 Minutes    Blima LedgerAdam Grayland Daisey AGNP-C Internal medicine

## 2018-10-20 ENCOUNTER — Telehealth: Payer: Self-pay

## 2018-10-20 NOTE — Telephone Encounter (Signed)
FAXED ORDER TO [facility] FOR NEBULIZER AND ALSO CALLED [facility]  AND ALSO LMOM WITH PT THAT WE SEND ORDER

## 2018-12-14 ENCOUNTER — Other Ambulatory Visit: Payer: Self-pay | Admitting: Adult Health

## 2018-12-14 DIAGNOSIS — F17219 Nicotine dependence, cigarettes, with unspecified nicotine-induced disorders: Secondary | ICD-10-CM

## 2019-01-13 ENCOUNTER — Other Ambulatory Visit: Payer: Self-pay

## 2019-01-13 ENCOUNTER — Ambulatory Visit (INDEPENDENT_AMBULATORY_CARE_PROVIDER_SITE_OTHER): Payer: Medicaid Other | Admitting: Internal Medicine

## 2019-01-13 ENCOUNTER — Encounter: Payer: Self-pay | Admitting: Internal Medicine

## 2019-01-13 ENCOUNTER — Ambulatory Visit
Admission: RE | Admit: 2019-01-13 | Discharge: 2019-01-13 | Disposition: A | Discharge status: Home / Self Care | Payer: Medicaid Other | Source: Ambulatory Visit | Attending: Adult Health | Admitting: Adult Health

## 2019-01-13 VITALS — BP 111/75 | HR 91 | Resp 16 | Ht 72.0 in | Wt 149.0 lb

## 2019-01-13 DIAGNOSIS — F17219 Nicotine dependence, cigarettes, with unspecified nicotine-induced disorders: Secondary | ICD-10-CM | POA: Diagnosis not present

## 2019-01-13 DIAGNOSIS — Z9981 Dependence on supplemental oxygen: Secondary | ICD-10-CM

## 2019-01-13 DIAGNOSIS — R0602 Shortness of breath: Secondary | ICD-10-CM | POA: Diagnosis not present

## 2019-01-13 DIAGNOSIS — J449 Chronic obstructive pulmonary disease, unspecified: Secondary | ICD-10-CM

## 2019-01-13 MED ORDER — NICOTINE 21 MG/24HR TD PT24: 21.0000 mg | MEDICATED_PATCH | Freq: Every day | TRANSDERMAL | 1 refills | Status: DC

## 2019-01-13 NOTE — Patient Instructions (Signed)
Chronic Obstructive Pulmonary Disease °Chronic obstructive pulmonary disease (COPD) is a long-term (chronic) lung problem. When you have COPD, it is hard for air to get in and out of your lungs. Usually the condition gets worse over time, and your lungs will never return to normal. There are things you can do to keep yourself as healthy as possible. °· Your doctor may treat your condition with: °? Medicines. °? Oxygen. °? Lung surgery. °· Your doctor may also recommend: °? Rehabilitation. This includes steps to make your body work better. It may involve a team of specialists. °? Quitting smoking, if you smoke. °? Exercise and changes to your diet. °? Comfort measures (palliative care). °Follow these instructions at home: °Medicines °· Take over-the-counter and prescription medicines only as told by your doctor. °· Talk to your doctor before taking any cough or allergy medicines. You may need to avoid medicines that cause your lungs to be dry. °Lifestyle °· If you smoke, stop. Smoking makes the problem worse. If you need help quitting, ask your doctor. °· Avoid being around things that make your breathing worse. This may include smoke, chemicals, and fumes. °· Stay active, but remember to rest as well. °· Learn and use tips on how to relax. °· Make sure you get enough sleep. Most adults need at least 7 hours of sleep every night. °· Eat healthy foods. Eat smaller meals more often. Rest before meals. °Controlled breathing °Learn and use tips on how to control your breathing as told by your doctor. Try: °· Breathing in (inhaling) through your nose for 1 second. Then, pucker your lips and breath out (exhale) through your lips for 2 seconds. °· Putting one hand on your belly (abdomen). Breathe in slowly through your nose for 1 second. Your hand on your belly should move out. Pucker your lips and breathe out slowly through your lips. Your hand on your belly should move in as you breathe out. ° °Controlled coughing °Learn  and use controlled coughing to clear mucus from your lungs. Follow these steps: °1. Lean your head a little forward. °2. Breathe in deeply. °3. Try to hold your breath for 3 seconds. °4. Keep your mouth slightly open while coughing 2 times. °5. Spit any mucus out into a tissue. °6. Rest and do the steps again 1 or 2 times as needed. °General instructions °· Make sure you get all the shots (vaccines) that your doctor recommends. Ask your doctor about a flu shot and a pneumonia shot. °· Use oxygen therapy and pulmonary rehabilitation if told by your doctor. If you need home oxygen therapy, ask your doctor if you should buy a tool to measure your oxygen level (oximeter). °· Make a COPD action plan with your doctor. This helps you to know what to do if you feel worse than usual. °· Manage any other conditions you have as told by your doctor. °· Avoid going outside when it is very hot, cold, or humid. °· Avoid people who have a sickness you can catch (contagious). °· Keep all follow-up visits as told by your doctor. This is important. °Contact a doctor if: °· You cough up more mucus than usual. °· There is a change in the color or thickness of the mucus. °· It is harder to breathe than usual. °· Your breathing is faster than usual. °· You have trouble sleeping. °· You need to use your medicines more often than usual. °· You have trouble doing your normal activities such as getting dressed   or walking around the house. °Get help right away if: °· You have shortness of breath while resting. °· You have shortness of breath that stops you from: °? Being able to talk. °? Doing normal activities. °· Your chest hurts for longer than 5 minutes. °· Your skin color is more blue than usual. °· Your pulse oximeter shows that you have low oxygen for longer than 5 minutes. °· You have a fever. °· You feel too tired to breathe normally. °Summary °· Chronic obstructive pulmonary disease (COPD) is a long-term lung problem. °· The way your  lungs work will never return to normal. Usually the condition gets worse over time. There are things you can do to keep yourself as healthy as possible. °· Take over-the-counter and prescription medicines only as told by your doctor. °· If you smoke, stop. Smoking makes the problem worse. °This information is not intended to replace advice given to you by your health care provider. Make sure you discuss any questions you have with your health care provider. °Document Released: 12/03/2007 Document Revised: 05/29/2017 Document Reviewed: 07/21/2016 °Elsevier Patient Education © 2020 [employer] ° °
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
## 2019-01-18 ENCOUNTER — Other Ambulatory Visit: Payer: Self-pay | Admitting: Internal Medicine

## 2019-01-18 DIAGNOSIS — J90 Pleural effusion, not elsewhere classified: Secondary | ICD-10-CM

## 2019-01-18 DIAGNOSIS — R918 Other nonspecific abnormal finding of lung field: Secondary | ICD-10-CM

## 2019-01-26 ENCOUNTER — Other Ambulatory Visit: Payer: Self-pay

## 2019-01-26 ENCOUNTER — Ambulatory Visit: Payer: Medicaid Other | Admitting: Internal Medicine

## 2019-01-26 DIAGNOSIS — R0602 Shortness of breath: Secondary | ICD-10-CM | POA: Diagnosis not present

## 2019-01-26 LAB — PULMONARY FUNCTION TEST

## 2019-01-30 NOTE — Procedures (Signed)
[place], [address]  DATE OF SERVICE: January 26, 2019  Complete Pulmonary Function Testing Interpretation:  FINDINGS:  Forced vital capacity is moderately decreased.  The FEV1 is 1.7 L which is 44% of predicted and is severely decreased.  Postbronchodilator there is no significant change in the FEV1.  Total lung capacity is normal residual volume is increased residual anti-lung capacity ratio is increased thoracic gas volume is increased.  DLCO is moderately decreased.  IMPRESSION:  This pulmonary function study is consistent with severe obstructive lung disease.  No response to bronchodilator DLCO is also moderately decreased.  Allyne Gee, MD [facility] Pulmonary Critical Care Medicine Sleep Medicine

## 2019-02-01 ENCOUNTER — Ambulatory Visit
Admission: RE | Admit: 2019-02-01 | Discharge: 2019-02-01 | Disposition: A | Discharge status: Home / Self Care | Payer: Medicaid Other | Source: Ambulatory Visit | Attending: Internal Medicine | Admitting: Internal Medicine

## 2019-02-01 ENCOUNTER — Other Ambulatory Visit: Payer: Self-pay

## 2019-02-01 DIAGNOSIS — R918 Other nonspecific abnormal finding of lung field: Secondary | ICD-10-CM | POA: Diagnosis present

## 2019-02-01 DIAGNOSIS — J90 Pleural effusion, not elsewhere classified: Secondary | ICD-10-CM | POA: Diagnosis present

## 2019-02-24 ENCOUNTER — Ambulatory Visit: Payer: Medicaid Other | Admitting: Internal Medicine

## 2019-03-08 ENCOUNTER — Ambulatory Visit: Payer: Self-pay | Admitting: Internal Medicine

## 2019-03-17 ENCOUNTER — Ambulatory Visit: Payer: Medicaid Other | Admitting: Internal Medicine

## 2019-03-31 ENCOUNTER — Encounter: Payer: Self-pay | Admitting: Internal Medicine

## 2019-03-31 ENCOUNTER — Ambulatory Visit: Payer: Medicaid Other | Admitting: Internal Medicine

## 2019-03-31 ENCOUNTER — Other Ambulatory Visit: Payer: Self-pay

## 2019-03-31 VITALS — BP 121/88 | HR 84 | Temp 97.2°F | Resp 16 | Ht 72.0 in | Wt 140.0 lb

## 2019-03-31 DIAGNOSIS — J449 Chronic obstructive pulmonary disease, unspecified: Secondary | ICD-10-CM

## 2019-03-31 DIAGNOSIS — R0602 Shortness of breath: Secondary | ICD-10-CM | POA: Diagnosis not present

## 2019-03-31 DIAGNOSIS — F17219 Nicotine dependence, cigarettes, with unspecified nicotine-induced disorders: Secondary | ICD-10-CM

## 2019-03-31 DIAGNOSIS — Z9981 Dependence on supplemental oxygen: Secondary | ICD-10-CM

## 2019-03-31 DIAGNOSIS — T17908A Unspecified foreign body in respiratory tract, part unspecified causing other injury, initial encounter: Secondary | ICD-10-CM

## 2019-03-31 MED ORDER — SPIRIVA HANDIHALER 18 MCG IN CAPS: ORAL_CAPSULE | RESPIRATORY_TRACT | 2 refills | Status: DC

## 2019-03-31 MED ORDER — ALBUTEROL SULFATE HFA 108 (90 BASE) MCG/ACT IN AERS: 2.0000 | INHALATION_SPRAY | Freq: Four times a day (QID) | RESPIRATORY_TRACT | 3 refills | Status: DC | PRN

## 2019-03-31 MED ORDER — DULERA 200-5 MCG/ACT IN AERO: 2.0000 | INHALATION_SPRAY | Freq: Two times a day (BID) | RESPIRATORY_TRACT | 4 refills | Status: DC
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
## 2019-04-24 ENCOUNTER — Other Ambulatory Visit: Payer: Self-pay | Admitting: Adult Health

## 2019-04-24 DIAGNOSIS — J449 Chronic obstructive pulmonary disease, unspecified: Secondary | ICD-10-CM

## 2019-05-17 ENCOUNTER — Other Ambulatory Visit: Payer: Self-pay | Admitting: Adult Health

## 2019-05-17 DIAGNOSIS — F17219 Nicotine dependence, cigarettes, with unspecified nicotine-induced disorders: Secondary | ICD-10-CM

## 2019-05-24 ENCOUNTER — Telehealth: Payer: Self-pay

## 2019-05-24 NOTE — Telephone Encounter (Signed)
Called lmom informing patient of appointment. klh 

## 2019-05-30 ENCOUNTER — Telehealth: Payer: Self-pay

## 2019-05-30 NOTE — Telephone Encounter (Signed)
Confirmed pt appt for 05/31/2019. Perry Bullock

## 2019-05-31 ENCOUNTER — Ambulatory Visit: Payer: Medicaid Other | Admitting: Internal Medicine

## 2019-07-13 ENCOUNTER — Other Ambulatory Visit: Payer: Self-pay | Admitting: Adult Health

## 2019-07-13 DIAGNOSIS — F17219 Nicotine dependence, cigarettes, with unspecified nicotine-induced disorders: Secondary | ICD-10-CM

## 2019-07-17 ENCOUNTER — Emergency Department
Admission: EM | Admit: 2019-07-17 | Discharge: 2019-07-18 | Disposition: A | Discharge status: Home / Self Care | Payer: Medicaid Other | Attending: Emergency Medicine | Admitting: Emergency Medicine

## 2019-07-17 ENCOUNTER — Other Ambulatory Visit: Payer: Self-pay

## 2019-07-17 DIAGNOSIS — J449 Chronic obstructive pulmonary disease, unspecified: Secondary | ICD-10-CM | POA: Diagnosis not present

## 2019-07-17 DIAGNOSIS — N390 Urinary tract infection, site not specified: Secondary | ICD-10-CM | POA: Diagnosis not present

## 2019-07-17 DIAGNOSIS — Z79899 Other long term (current) drug therapy: Secondary | ICD-10-CM | POA: Insufficient documentation

## 2019-07-17 DIAGNOSIS — F10929 Alcohol use, unspecified with intoxication, unspecified: Secondary | ICD-10-CM | POA: Diagnosis not present

## 2019-07-17 DIAGNOSIS — Y908 Blood alcohol level of 240 mg/100 ml or more: Secondary | ICD-10-CM | POA: Diagnosis not present

## 2019-07-17 DIAGNOSIS — F17210 Nicotine dependence, cigarettes, uncomplicated: Secondary | ICD-10-CM | POA: Diagnosis not present

## 2019-07-17 LAB — CBC WITH DIFFERENTIAL/PLATELET
Abs Immature Granulocytes: 0.02 10*3/uL (ref 0.00–0.07)
Basophils Absolute: 0.1 10*3/uL (ref 0.0–0.1)
Basophils Relative: 1 %
Eosinophils Absolute: 0.2 10*3/uL (ref 0.0–0.5)
Eosinophils Relative: 2 %
HCT: 40.4 % (ref 39.0–52.0)
Hemoglobin: 13.7 g/dL (ref 13.0–17.0)
Immature Granulocytes: 0 %
Lymphocytes Relative: 16 %
Lymphs Abs: 1.4 10*3/uL (ref 0.7–4.0)
MCH: 32.6 pg (ref 26.0–34.0)
MCHC: 33.9 g/dL (ref 30.0–36.0)
MCV: 96.2 fL (ref 80.0–100.0)
Monocytes Absolute: 0.6 10*3/uL (ref 0.1–1.0)
Monocytes Relative: 7 %
Neutro Abs: 6.3 10*3/uL (ref 1.7–7.7)
Neutrophils Relative %: 74 %
Platelets: 140 10*3/uL — ABNORMAL LOW (ref 150–400)
RBC: 4.2 MIL/uL — ABNORMAL LOW (ref 4.22–5.81)
RDW: 13.3 % (ref 11.5–15.5)
WBC: 8.6 10*3/uL (ref 4.0–10.5)
nRBC: 0 % (ref 0.0–0.2)

## 2019-07-17 LAB — URINE DRUG SCREEN, QUALITATIVE (ARMC ONLY)
Amphetamines, Ur Screen: NOT DETECTED
Barbiturates, Ur Screen: NOT DETECTED
Benzodiazepine, Ur Scrn: NOT DETECTED
Cannabinoid 50 Ng, Ur ~~LOC~~: POSITIVE — AB
Cocaine Metabolite,Ur ~~LOC~~: NOT DETECTED
MDMA (Ecstasy)Ur Screen: NOT DETECTED
Methadone Scn, Ur: NOT DETECTED
Opiate, Ur Screen: POSITIVE — AB
Phencyclidine (PCP) Ur S: NOT DETECTED
Tricyclic, Ur Screen: NOT DETECTED

## 2019-07-17 LAB — BASIC METABOLIC PANEL
Anion gap: 12 (ref 5–15)
BUN: 7 mg/dL (ref 6–20)
CO2: 26 mmol/L (ref 22–32)
Calcium: 8.4 mg/dL — ABNORMAL LOW (ref 8.9–10.3)
Chloride: 100 mmol/L (ref 98–111)
Creatinine, Ser: 0.69 mg/dL (ref 0.61–1.24)
GFR calc Af Amer: >60 mL/min (ref >60)
GFR calc non Af Amer: >60 mL/min (ref >60)
Glucose, Bld: 83 mg/dL (ref 70–99)
Potassium: 4.1 mmol/L (ref 3.5–5.1)
Sodium: 138 mmol/L (ref 135–145)

## 2019-07-17 LAB — URINALYSIS, COMPLETE (UACMP) WITH MICROSCOPIC
Bilirubin Urine: NEGATIVE
Glucose, UA: NEGATIVE mg/dL
Hgb urine dipstick: NEGATIVE
Ketones, ur: NEGATIVE mg/dL
Nitrite: NEGATIVE
Protein, ur: 30 mg/dL — AB
Specific Gravity, Urine: 1.013 (ref 1.005–1.030)
Squamous Epithelial / HPF: NONE SEEN (ref 0–5)
WBC, UA: >50 WBC/hpf — ABNORMAL HIGH (ref 0–5)
pH: 6 (ref 5.0–8.0)

## 2019-07-17 LAB — HEPATIC FUNCTION PANEL
ALT: 31 U/L (ref 0–44)
AST: 77 U/L — ABNORMAL HIGH (ref 15–41)
Albumin: 4.2 g/dL (ref 3.5–5.0)
Alkaline Phosphatase: 82 U/L (ref 38–126)
Bilirubin, Direct: 0.3 mg/dL — ABNORMAL HIGH (ref 0.0–0.2)
Indirect Bilirubin: 0.6 mg/dL (ref 0.3–0.9)
Total Bilirubin: 0.9 mg/dL (ref 0.3–1.2)
Total Protein: 7 g/dL (ref 6.5–8.1)

## 2019-07-17 LAB — SALICYLATE LEVEL: Salicylate Lvl: <7 mg/dL — ABNORMAL LOW (ref 7.0–30.0)

## 2019-07-17 LAB — ACETAMINOPHEN LEVEL: Acetaminophen (Tylenol), Serum: <10 ug/mL — ABNORMAL LOW (ref 10–30)

## 2019-07-17 LAB — LIPASE, BLOOD: Lipase: 34 U/L (ref 11–51)

## 2019-07-17 LAB — ETHANOL: Alcohol, Ethyl (B): 469 mg/dL (ref <10)

## 2019-07-17 LAB — AMMONIA: Ammonia: 25 umol/L (ref 9–35)

## 2019-07-17 MED ORDER — ONDANSETRON 4 MG PO TBDP
4.0000 mg | ORAL_TABLET | Freq: Once | ORAL | Status: AC
  Administered 2019-07-17: 20:00:00 4 mg via ORAL
  Filled 2019-07-17: qty 1

## 2019-07-17 MED ORDER — IPRATROPIUM-ALBUTEROL 0.5-2.5 (3) MG/3ML IN SOLN
3.0000 mL | Freq: Once | RESPIRATORY_TRACT | Status: AC
  Administered 2019-07-17: 19:00:00 3 mL via RESPIRATORY_TRACT
  Filled 2019-07-17: qty 3

## 2019-07-17 MED ORDER — CEPHALEXIN 500 MG PO CAPS: 500.0000 mg | ORAL_CAPSULE | Freq: Two times a day (BID) | ORAL | 0 refills | Status: DC

## 2019-07-17 NOTE — ED Notes (Addendum)
Pt requesting oxygen. Reports he uses 2L/min via [place] at home when he feels like he needs it. Pt then vomited a large amount of thin clear fluid that smelled like alcohol. Pt was able to roll himself to the side and vomit on the floor. Pt then requested zofran which he takes at home when needed. Dr Marisa Severin informed. New orders received.

## 2019-07-17 NOTE — BH Assessment (Signed)
Writer unable to complete TTS consult at this time. Patient is unable to participate in the interview/assessment.

## 2019-07-17 NOTE — ED Notes (Signed)
Gave pt dinner tray, said he did not want it. Placed on sink in room.

## 2019-07-17 NOTE — ED Notes (Addendum)
Pt arrived in handcuffs with several [employer] officers with IVC paperwork.  Pt uncooperative in triage, spitting. Unable to obtain labs, urine and unable to dress out. Pt taken to room 22. Charge RN Gearldine Bienenstock and Primary RN Bill notified of patient status and unable to dress out. Dr. Marisa Severin advised of patient as well.
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
## 2019-07-17 NOTE — BH Assessment (Signed)
Patient unable to engage in a meaniful assessment at this time. Provider will try again before end of shift.

## 2019-07-17 NOTE — ED Notes (Addendum)
Pt brought from triage in street clothes not dressed out in scrubs. Pt loud but not aggressive. Agreed to blood draw and urine. Denies SI/HI. Plan to wait til pt is sleepy prior to dressing out.
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
## 2019-07-18 DIAGNOSIS — F10929 Alcohol use, unspecified with intoxication, unspecified: Secondary | ICD-10-CM | POA: Insufficient documentation

## 2019-07-18 MED ORDER — LORAZEPAM 2 MG PO TABS
2.0000 mg | ORAL_TABLET | Freq: Once | ORAL | Status: AC
  Administered 2019-07-18: 06:00:00 2 mg via ORAL
  Filled 2019-07-18: qty 1

## 2019-07-18 MED ORDER — CHLORDIAZEPOXIDE HCL 25 MG PO CAPS
50.0000 mg | ORAL_CAPSULE | Freq: Three times a day (TID) | ORAL | Status: DC
  Administered 2019-07-18: 10:00:00 50 mg via ORAL
  Filled 2019-07-18: qty 2

## 2019-07-18 MED ORDER — FOLIC ACID 1 MG PO TABS
1.0000 mg | ORAL_TABLET | Freq: Once | ORAL | Status: AC
  Administered 2019-07-18: 10:00:00 1 mg via ORAL
  Filled 2019-07-18: qty 1

## 2019-07-18 MED ORDER — CEFDINIR 300 MG PO CAPS
300.0000 mg | ORAL_CAPSULE | Freq: Once | ORAL | Status: AC
  Administered 2019-07-18: 11:00:00 300 mg via ORAL
  Filled 2019-07-18: qty 1

## 2019-07-18 MED ORDER — CEFDINIR 300 MG PO CAPS
300.0000 mg | ORAL_CAPSULE | Freq: Two times a day (BID) | ORAL | Status: DC
  Administered 2019-07-18: 11:00:00 300 mg via ORAL
  Filled 2019-07-18 (×2): qty 1

## 2019-07-18 MED ORDER — THIAMINE HCL 100 MG PO TABS
100.0000 mg | ORAL_TABLET | Freq: Once | ORAL | Status: AC
  Administered 2019-07-18: 10:00:00 100 mg via ORAL
  Filled 2019-07-18: qty 1

## 2019-07-18 MED ORDER — CEFDINIR 300 MG PO CAPS: 300.0000 mg | ORAL_CAPSULE | Freq: Two times a day (BID) | ORAL | 0 refills | Status: AC

## 2019-07-18 MED ORDER — CEPHALEXIN 500 MG PO CAPS: 500.0000 mg | ORAL_CAPSULE | Freq: Once | ORAL | Status: DC

## 2019-07-18 NOTE — Discharge Instructions (Addendum)
Thank you for letting us take care of you in the emergency department today.   Please continue to take any regular, prescribed medications.   New medications we have prescribed:  - Cefdinir (Omnicef), for your urine infection - please take the entire course as directed  Please follow up with: - Your primary care doctor - to review your ER visit and follow up on your symptoms.  - [facility] services, if desired, for assistance w/ detox  Please return to the ER for any new or worsening symptoms.

## 2019-07-18 NOTE — ED Provider Notes (Signed)
Patient awaiting repeat psychiatric evaluation.  Will initiate Librium taper while waiting, monitor on CIWA. Thiamine/folate. Will give first dose of antibiotics for UTI.     Miguel Aschoff., MD 07/18/19 [phone]

## 2019-07-18 NOTE — ED Provider Notes (Signed)
10:37 AM Patient has been seen and evaluated by psychiatry team and deemed appropriate for discharge. Patient determined not to be a threat to themselves or others. IVC rescinded by psychiatry team. Patient is otherwise medically clear.   Patient is clinically sober - clear speech, linear thought process, and has tolerated PO in the ED.   At this time, he does not desire any assistance with alcohol detox, including declining Librium taper. Will provide outpatient resources should he change his mind.   Will plan for discharge with outpatient follow up. Rx for UTI.     Miguel Aschoff., MD 07/18/19 1039

## 2019-07-18 NOTE — ED Notes (Signed)
Pt discharged home with significant other. VS stable. Pt denies SI/HI.  All belongings returned to patient.  Discharge instructions and prescriptions reviewed with patient. Pt signed for discharge.

## 2019-07-18 NOTE — ED Notes (Signed)
IVC/Refused to engage in Psych Consult.

## 2019-07-18 NOTE — ED Notes (Signed)
Pt given phone to make discharge arrangements.

## 2019-07-18 NOTE — ED Notes (Signed)
Pt refusing BP

## 2019-07-18 NOTE — ED Notes (Signed)
Pt awake, shaking reports he would like medication for his shakes. [place], NP informed and will order ativan.

## 2019-07-18 NOTE — ED Notes (Addendum)
RN contacted his significant other, [name].  [name] stated the patient is no longer able to reside in her home.  She reported she is filing a 50B.  EDP and patient made aware. SW consult ordered.

## 2019-07-18 NOTE — Consult Note (Signed)
[facility] Consult   Reason for Consult: IVC for drinking Referring Physician: Dr. Cherylann Banas Patient Identification: Perry Bullock MRN:  [id number] Principal Diagnosis: <principal problem not specified> Diagnosis:  Active Problems:   * No active hospital problems. *   Total Time spent with patient: 30 minutes  Subjective:   Perry Bullock is a 59 y.o. male patient who presented under IVC due to heavy substance use and neglect of his medical condition including UTI.  HPI: Patient is a 59 year old male who was placed under IVC by his girlfriend due to neglect of his medical condition and heavy drinking.  Patient reports drinking he serious amount of alcohol.  He reports drinking like this often, states almost every day.  He denies any inciting factors for his drinking or any poor mood that leads him to drink.  Patient states that he drinks the past that time since being placed on disability 3 years ago after falling off a roof.  Patient denies any current psychiatric symptoms.  Patient denies any suicidal or homicidal ideation.   Past Psychiatric History: Patient has a history of bipolar disorder, although denies any previous manic episodes or depressive episodes.  Reports he was given that diagnosis one time in emergency department but was never placed in psychiatric hospital before.  Denies any previous suicide attempts  Risk to Self:  No Risk to Others:  No Prior Inpatient Therapy:  No Prior Outpatient Therapy:  Yes  Past Medical History:  Past Medical History:  Diagnosis Date  . Anxiety   . Arthritis   . Asthma   . Bipolar 1 disorder ([place])   . Blind right eye   . Broken hip ([place])   . Chronic back pain    Diverticulosis  . Chronic headaches   . Convulsion ([place])    once a month when stands up too quickly  . COPD (chronic obstructive pulmonary disease) ([place])   . DDD (degenerative disc disease), lumbar   . Diverticulitis   . GERD (gastroesophageal reflux  disease)   . Glaucoma   . Hepatitis    hepatitis C  . Pneumonia   . PVD (peripheral vascular disease) ([facility])    lower extremities reddened and feet swollen  . Seasonal allergies   . Shortness of breath dyspnea     Past Surgical History:  Procedure Laterality Date  . EYE SURGERY Right   . INTRAMEDULLARY (IM) NAIL INTERTROCHANTERIC Right 01/16/2017   Procedure: INTRAMEDULLARY (IM) NAIL INTERTROCHANTRIC;  Surgeon: Corky Mull, MD;  Location: [facility] ORS;  Service: Orthopedics;  Laterality: Right;  . INTUBATION-ENDOTRACHEAL WITH TRACHEOSTOMY STANDBY N/A 04/22/2015   Procedure: INTUBATION-ENDOTRACHEAL WITH TRACHEOSTOMY STANDBY;  Surgeon: Beverly Gust, MD;  Location: [facility] ORS;  Service: ENT;  Laterality: N/A;  . JOINT REPLACEMENT Left    Lt shoulder  . MANDIBLE SURGERY     Family History:  Family History  Problem Relation Age of Onset  . Breast cancer Mother   . Hypertension Father   . Diabetes Father    Family Psychiatric  History: Denies Social History:  Social History   Substance and Sexual Activity  Alcohol Use Yes  . Alcohol/week: 3.0 standard drinks  . Types: 3 Cans of beer per week   Comment: pt is cutting back to 3-4 daily     Social History   Substance and Sexual Activity  Drug Use No    Social History   Socioeconomic History  . Marital status: Single    Spouse name: Not on  file  . Number of children: Not on file  . Years of education: Not on file  . Highest education level: Not on file  Occupational History  . Not on file  Tobacco Use  . Smoking status: Current Every Day Smoker    Packs/day: 0.00    Years: 35.00    Pack years: 0.00    Types: Cigarettes  . Smokeless tobacco: Never Used  . Tobacco comment: 4 cigs daily--02/27/16, pt has patch  Substance and Sexual Activity  . Alcohol use: Yes    Alcohol/week: 3.0 standard drinks    Types: 3 Cans of beer per week    Comment: pt is cutting back to 3-4 daily  . Drug use: No  . Sexual activity: Yes     Birth control/protection: None  Other Topics Concern  . Not on file  Social History Narrative  . Not on file   Social Determinants of Health   Financial Resource Strain:   . Difficulty of Paying Living Expenses: Not on file  Food Insecurity:   . Worried About Programme researcher, broadcasting/film/videounning Out of Food in the Last Year: Not on file  . Ran Out of Food in the Last Year: Not on file  Transportation Needs:   . Lack of Transportation (Medical): Not on file  . Lack of Transportation (Non-Medical): Not on file  Physical Activity:   . Days of Exercise per Week: Not on file  . Minutes of Exercise per Session: Not on file  Stress:   . Feeling of Stress : Not on file  Social Connections:   . Frequency of Communication with Friends and Family: Not on file  . Frequency of Social Gatherings with Friends and Family: Not on file  . Attends Religious Services: Not on file  . Active Member of Clubs or Organizations: Not on file  . Attends BankerClub or Organization Meetings: Not on file  . Marital Status: Not on file   Additional Social History: Patient reports living with his girlfriend.  Reports not working for about 3 years since being placed on disability when falling off her.    Allergies:   Allergies  Allergen Reactions  . Ace Inhibitors Swelling    Angioedema 10/16 requiring intubation @ OSH 2/2 pt reportedly taking someone else's Lisinopril.  Angioedema 10/16 requiring intubation @ OSH 2/2 pt reportedly taking someone else's Lisinopril.      Labs:  Results for orders placed or performed during the hospital encounter of 07/17/19 (from the past 48 hour(s))  Basic metabolic panel     Status: Abnormal   Collection Time: 07/17/19  4:29 PM  Result Value Ref Range   Sodium 138 135 - 145 mmol/L   Potassium 4.1 3.5 - 5.1 mmol/L   Chloride 100 98 - 111 mmol/L   CO2 26 22 - 32 mmol/L   Glucose, Bld 83 70 - 99 mg/dL   BUN 7 6 - 20 mg/dL   Creatinine, Ser 4.090.69 0.61 - 1.24 mg/dL   Calcium 8.4 (L) 8.9 - 10.3 mg/dL    GFR calc non Af Amer >60 >60 mL/min   GFR calc Af Amer >60 >60 mL/min   Anion gap 12 5 - 15    Comment: Performed at [facility], [address] [place], [place] [address]  CBC with Differential     Status: Abnormal   Collection Time: 07/17/19  4:29 PM  Result Value Ref Range   WBC 8.6 4.0 - 10.5 K/uL   RBC 4.20 (L) 4.22 -  5.81 MIL/uL   Hemoglobin 13.7 13.0 - 17.0 g/dL   HCT 29.9 24.2 - 68.3 %   MCV 96.2 80.0 - 100.0 fL   MCH 32.6 26.0 - 34.0 pg   MCHC 33.9 30.0 - 36.0 g/dL   RDW 41.9 62.2 - 29.7 %   Platelets 140 (L) 150 - 400 K/uL   nRBC 0.0 0.0 - 0.2 %   Neutrophils Relative % 74 %   Neutro Abs 6.3 1.7 - 7.7 K/uL   Lymphocytes Relative 16 %   Lymphs Abs 1.4 0.7 - 4.0 K/uL   Monocytes Relative 7 %   Monocytes Absolute 0.6 0.1 - 1.0 K/uL   Eosinophils Relative 2 %   Eosinophils Absolute 0.2 0.0 - 0.5 K/uL   Basophils Relative 1 %   Basophils Absolute 0.1 0.0 - 0.1 K/uL   Immature Granulocytes 0 %   Abs Immature Granulocytes 0.02 0.00 - 0.07 K/uL    Comment: Performed at [facility], [address] [place], [place] [address]  Lipase, blood     Status: None   Collection Time: 07/17/19  4:29 PM  Result Value Ref Range   Lipase 34 11 - 51 U/L    Comment: Performed at [facility], [address] [place], [place] [address]  Hepatic function panel     Status: Abnormal   Collection Time: 07/17/19  4:29 PM  Result Value Ref Range   Total Protein 7.0 6.5 - 8.1 g/dL   Albumin 4.2 3.5 - 5.0 g/dL   AST 77 (H) 15 - 41 U/L   ALT 31 0 - 44 U/L   Alkaline Phosphatase 82 38 - 126 U/L   Total Bilirubin 0.9 0.3 - 1.2 mg/dL   Bilirubin, Direct 0.3 (H) 0.0 - 0.2 mg/dL   Indirect Bilirubin 0.6 0.3 - 0.9 mg/dL    Comment: Performed at [facility], [address] [place], [place] [address]  Acetaminophen level     Status: Abnormal   Collection Time: 07/17/19  4:29 PM  Result Value Ref Range   Acetaminophen (Tylenol), Serum <10 (L) 10 - 30  ug/mL    Comment: (NOTE) Therapeutic concentrations vary significantly. A range of 10-30 ug/mL  may be an effective concentration for many patients. However, some  are best treated at concentrations outside of this range. Acetaminophen concentrations >150 ug/mL at 4 hours after ingestion  and >50 ug/mL at 12 hours after ingestion are often associated with  toxic reactions. Performed at [facility], [address] [place], [place] [address]   Salicylate level     Status: Abnormal   Collection Time: 07/17/19  4:29 PM  Result Value Ref Range   Salicylate Lvl <7.0 (L) 7.0 - 30.0 mg/dL    Comment: Performed at [facility], [address] [place], [place] [address]  Ethanol     Status: Abnormal   Collection Time: 07/17/19  4:29 PM  Result Value Ref Range   Alcohol, Ethyl (B) 469 (HH) <10 mg/dL    Comment: CRITICAL RESULT CALLED TO, READ BACK BY AND VERIFIED WITH Corwin Levins ON 07/17/19 AT 1704 BY JAG (NOTE) Lowest detectable limit for serum alcohol is 10 mg/dL. For medical purposes only. Performed at [facility], [address] [place], [place] [address]   Urine Drug Screen, Qualitative     Status: Abnormal   Collection Time: 07/17/19  4:47 PM  Result Value Ref Range   Tricyclic, Ur Screen NONE DETECTED NONE DETECTED  Amphetamines, Ur Screen NONE DETECTED NONE DETECTED   MDMA (Ecstasy)Ur Screen NONE DETECTED NONE DETECTED   Cocaine Metabolite,Ur [place] NONE DETECTED NONE DETECTED   Opiate, Ur Screen POSITIVE (A) NONE DETECTED   Phencyclidine (PCP) Ur S NONE DETECTED NONE DETECTED   Cannabinoid 50 Ng, Ur [place] POSITIVE (A) NONE DETECTED   Barbiturates, Ur Screen NONE DETECTED NONE DETECTED   Benzodiazepine, Ur Scrn NONE DETECTED NONE DETECTED   Methadone Scn, Ur NONE DETECTED NONE DETECTED    Comment: (NOTE) Tricyclics + metabolites, urine    Cutoff 1000 ng/mL Amphetamines + metabolites, urine  Cutoff 1000 ng/mL MDMA (Ecstasy), urine               Cutoff 500 ng/mL Cocaine Metabolite, urine          Cutoff 300 ng/mL Opiate + metabolites, urine        Cutoff 300 ng/mL Phencyclidine (PCP), urine         Cutoff 25 ng/mL Cannabinoid, urine                 Cutoff 50 ng/mL Barbiturates + metabolites, urine  Cutoff 200 ng/mL Benzodiazepine, urine              Cutoff 200 ng/mL Methadone, urine                   Cutoff 300 ng/mL The urine drug screen provides only a preliminary, unconfirmed analytical test result and should not be used for non-medical purposes. Clinical consideration and professional judgment should be applied to any positive drug screen result due to possible interfering substances. A more specific alternate chemical method must be used in order to obtain a confirmed analytical result. Gas chromatography / mass spectrometry (GC/MS) is the preferred confirmat ory method. Performed at [facility], [address] [place], [place] [address]   Urinalysis, Complete w Microscopic     Status: Abnormal   Collection Time: 07/17/19  4:47 PM  Result Value Ref Range   Color, Urine AMBER (A) YELLOW    Comment: BIOCHEMICALS MAY BE AFFECTED BY COLOR   APPearance CLOUDY (A) CLEAR   Specific Gravity, Urine 1.013 1.005 - 1.030   pH 6.0 5.0 - 8.0   Glucose, UA NEGATIVE NEGATIVE mg/dL   Hgb urine dipstick NEGATIVE NEGATIVE   Bilirubin Urine NEGATIVE NEGATIVE   Ketones, ur NEGATIVE NEGATIVE mg/dL   Protein, ur 30 (A) NEGATIVE mg/dL   Nitrite NEGATIVE NEGATIVE   Leukocytes,Ua MODERATE (A) NEGATIVE   RBC / HPF 0-5 0 - 5 RBC/hpf   WBC, UA >50 (H) 0 - 5 WBC/hpf   Bacteria, UA MANY (A) NONE SEEN   Squamous Epithelial / LPF NONE SEEN 0 - 5   WBC Clumps PRESENT    Mucus PRESENT     Comment: Performed at [facility], [address] [place], [place] [address]  Ammonia     Status: None   Collection Time: 07/17/19  4:47 PM  Result Value Ref Range   Ammonia 25 9 - 35 umol/L    Comment: Performed at [facility], [address] [place], [place] [address]    Current Facility-Administered Medications  Medication Dose Route Frequency Provider Last Rate Last Admin  . chlordiazePOXIDE (LIBRIUM) capsule 50 mg  50 mg Oral TID Miguel Aschoff., MD   50 mg at 07/18/19 0263   Current Outpatient Medications  Medication Sig Dispense Refill  . albuterol (PROAIR HFA) 108 (90 Base) MCG/ACT inhaler Inhale  2 puffs into the lungs every 6 (six) hours as needed for wheezing or shortness of breath. 18 g 3  . albuterol (PROVENTIL) (2.5 MG/3ML) 0.083% nebulizer solution Take 3 mLs (2.5 mg total) by nebulization every 6 (six) hours as needed for wheezing or shortness of breath. 75 mL 2  . allopurinol (ZYLOPRIM) 100 MG tablet Take 1 tablet (100 mg total) by mouth daily. 20 tablet 0  . cefdinir (OMNICEF) 300 MG capsule Take 1 capsule (300 mg total) by mouth 2 (two) times daily for 10 days. 20 capsule 0  . cetirizine (ZYRTEC) 10 MG tablet Take 10 mg by mouth daily.    . DULERA 200-5 MCG/ACT AERO Inhale 2 puffs by mouth twice daily 13 g 0  . EMBEDA 30-1.2 MG CPCR Take 1 capsule by mouth daily.  0  . fluticasone (FLONASE) 50 MCG/ACT nasal spray Place 2 sprays into both nostrils as needed.     . gabapentin (NEURONTIN) 600 MG tablet Take 600 mg by mouth 3 (three) times daily.     . nicotine (NICODERM CQ - DOSED IN MG/24 HOURS) 21 mg/24hr patch APPLY 1 PATCH TOPICALLY TO SKIN ONCE DAILY 28 patch 0  . nicotine (NICODERM CQ) 14 mg/24hr patch Place 1 patch (14 mg total) onto the skin daily. 28 patch 0  . ondansetron (ZOFRAN) 4 MG tablet Take 1 tablet (4 mg total) by mouth every 8 (eight) hours as needed for nausea. 20 tablet 0  . oxyCODONE (OXY IR/ROXICODONE) 5 MG immediate release tablet Take 1-2 tablets (5-10 mg total) by mouth every 4 (four) hours as needed for moderate pain. 40 tablet 0  . OXYGEN Inhale 2 L into the lungs.    . pantoprazole (PROTONIX) 40 MG tablet Take 1 tablet (40 mg total) by mouth daily. 30 tablet 0  .  predniSONE (DELTASONE) 10 MG tablet Use per dose pack 21 tablet 0  . QUEtiapine (SEROQUEL) 200 MG tablet Take 200 mg by mouth at bedtime.     Marland Kitchen tiotropium (SPIRIVA HANDIHALER) 18 MCG inhalation capsule INHALE THE CONTENTS OF ONE CAPSULE BY MOUTH ONCE DAILY 30 capsule 2  . traZODone (DESYREL) 100 MG tablet Take 300 mg by mouth at bedtime.       Musculoskeletal: Strength & Muscle Tone: within normal limits Gait & Station: normal Patient leans: N/A  Psychiatric Specialty Exam: Physical Exam  Constitutional: He appears well-developed.  Musculoskeletal:        General: Normal range of motion.  Psychiatric: He has a normal mood and affect. His behavior is normal. Judgment normal.    Review of Systems  HENT: Negative for congestion.   Eyes: Negative for discharge.  Respiratory: Negative for apnea.   Endocrine: Negative for cold intolerance.  Psychiatric/Behavioral: Negative for agitation, behavioral problems, confusion, decreased concentration, dysphoric mood, hallucinations, self-injury, sleep disturbance and suicidal ideas. The patient is not nervous/anxious and is not hyperactive.     Blood pressure 137/78, pulse (!) 112, resp. rate 18, height 5\' 8"  (1.727 m), weight 59 kg, SpO2 96 %.Body mass index is 19.77 kg/m.  General Appearance: Disheveled  Eye Contact:  Fair  Speech:  Clear and Coherent  Volume:  Normal  Mood:  Euthymic  Affect:  Congruent  Thought Process:  Coherent  Orientation:  Full (Time, Place, and Person)  Thought Content:  Logical  Suicidal Thoughts:  No  Homicidal Thoughts:  No  Memory:  Recent;   Fair  Judgement:  Intact  Insight:  Fair  Psychomotor Activity:  Normal  Concentration:  Concentration: Fair  Recall:  [employer]Fair  Fund of Knowledge:  Fair  Language:  Fair  Akathisia:  No  Handed:  Right  AIMS (if indicated):     Assets:  Communication Skills Desire for Improvement Housing Physical Health Resilience  ADL's:  Intact  Cognition:  WNL  Sleep:         Treatment Plan Summary: Patient is a 59 year old male who was brought to the emergency department under IVC due to his drinking and neglect of medical issue.  Patient currently accepting treatment for his UTI.  Denies that his drinking is a problem and denies wanting assistance to stop drinking.  Resources will be placed in his chart.  Patient has a reported history of bipolar disorder however writer sees no evidence of this presently.  Patient is deemed safe to return to community.  IVC rescinded patient to be discharged  Diagnosis: Alcohol abuse    Disposition: No evidence of imminent risk to self or others at present.   Patient does not meet criteria for psychiatric inpatient admission. Supportive therapy provided about ongoing stressors. Discussed crisis plan, support from social network, calling 911, coming to the Emergency Department, and calling Suicide Hotline.  Clement SayresPaul A Chalmer Zheng, MD 07/18/2019 11:35 AM

## 2019-07-18 NOTE — ED Notes (Signed)
Pt awake asking for something to drink. Given juice and water. Asked if he wants to go home and pt states "I don't have a home'. Handed his drink to this RN and asked me to sit by the bed. Said Thank You and rolled over covering his head with his pillow.

## 2019-07-18 NOTE — ED Notes (Addendum)
Pt's significant other called this Clinical research associate and stated she was on the way to pick up the patient. EDP made aware.

## 2019-07-18 NOTE — ED Notes (Signed)
Pt coughing and sounds like he is dry heaving. When asked if he is nauseated he states no asked about other sx related to withdrawal sx and pt refusing to answer questions. Explained to pt that med's can be requested if he develops withdrawal sx. Encouraged pt to inform staff if he needs anything. Pt continues to not answer or respond to this RN.

## 2019-07-18 NOTE — BH Specialist Note (Signed)
Patient refused to engage in any type of assessment with me. He did however tell me his name and date of birth and the put his head under the covers and refused to answer any more questions.

## 2019-07-29 IMAGING — US US EXTREM LOW VENOUS*R*
1 series · 13 of 24 positions shown · non-contrast
Comparison: None.

CLINICAL DATA: Right lower extremity pain and swelling. Evaluate
for DVT.



[Series 1: us extrem low venous*right* · 0.07mm/px · 13 of 33 slices shown]
[im 1/33]
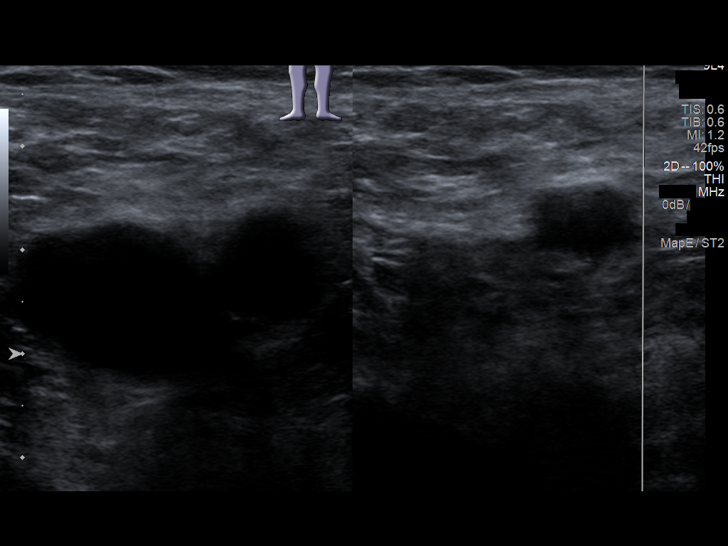
[im 3/33]
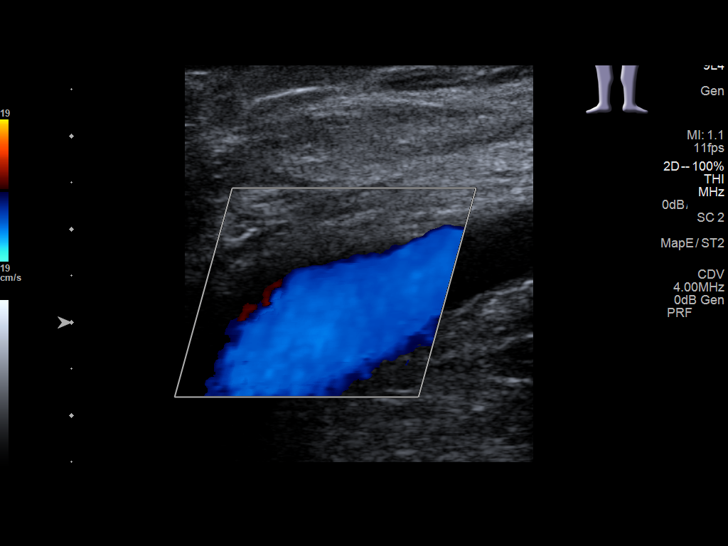
[im 6/33]
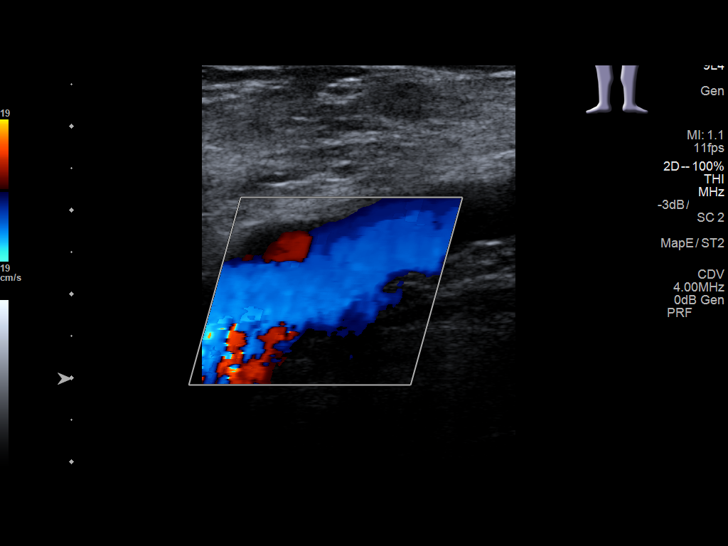
[im 9/33]
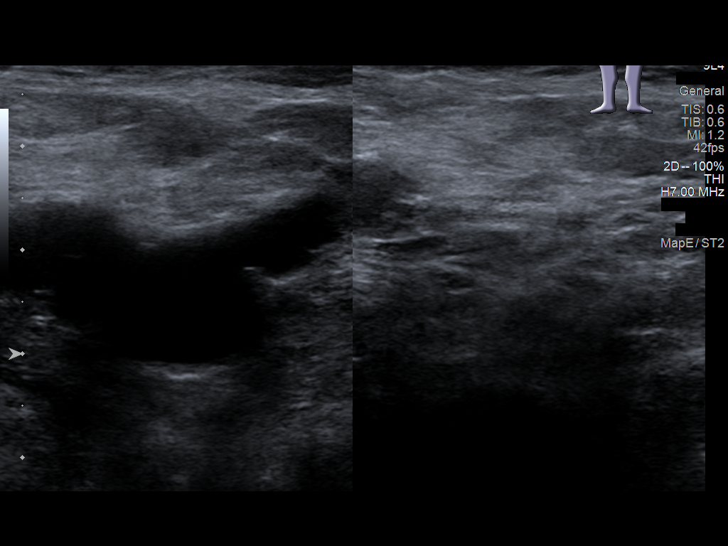
[im 12/33]
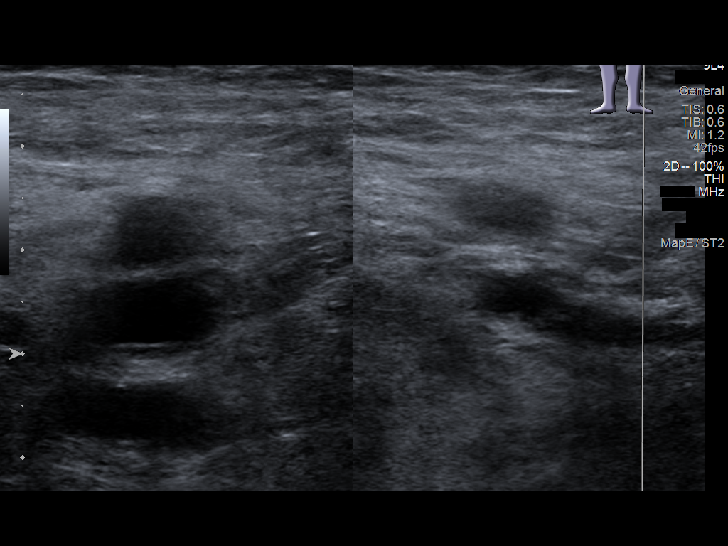
[im 14/33]
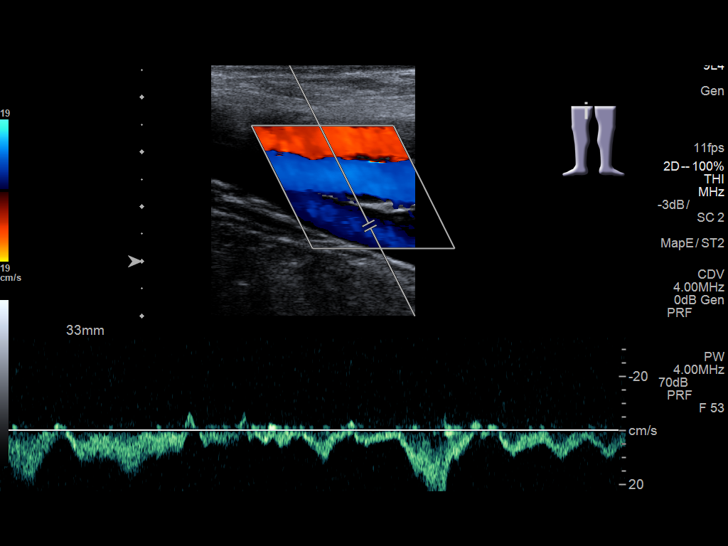
[im 17/33]
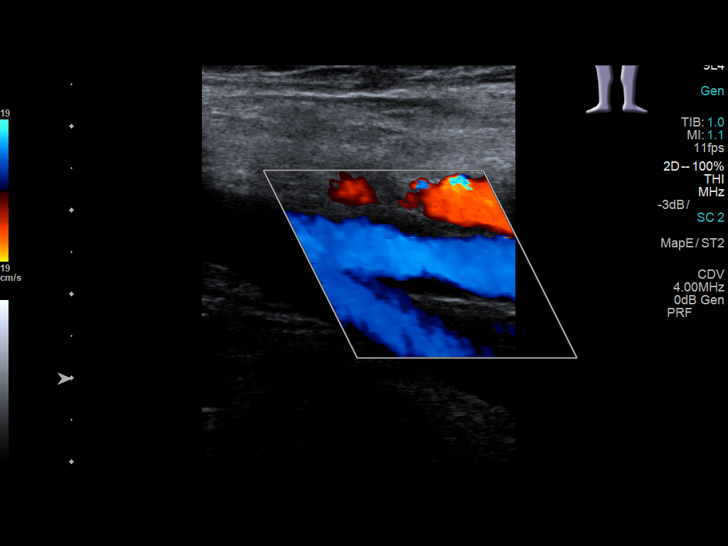
[im 19/33]
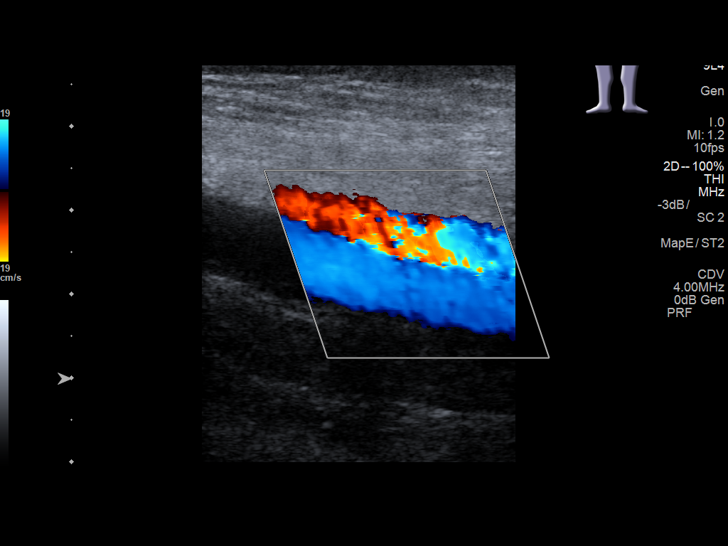
[im 21/33]
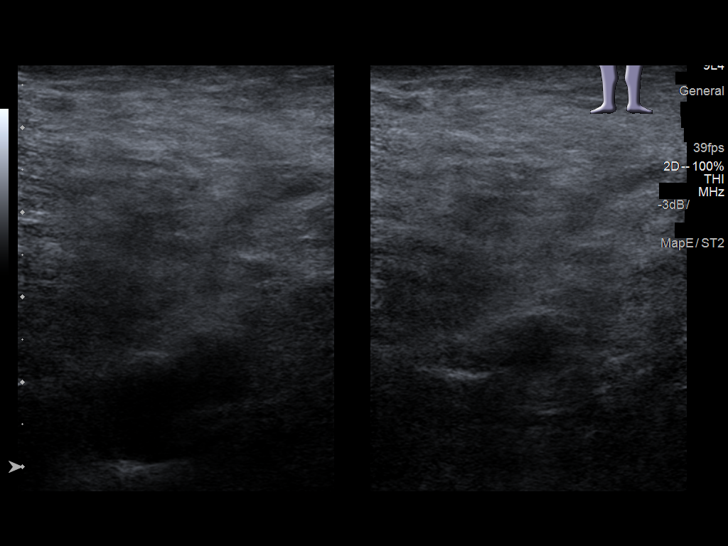
[im 24/33]
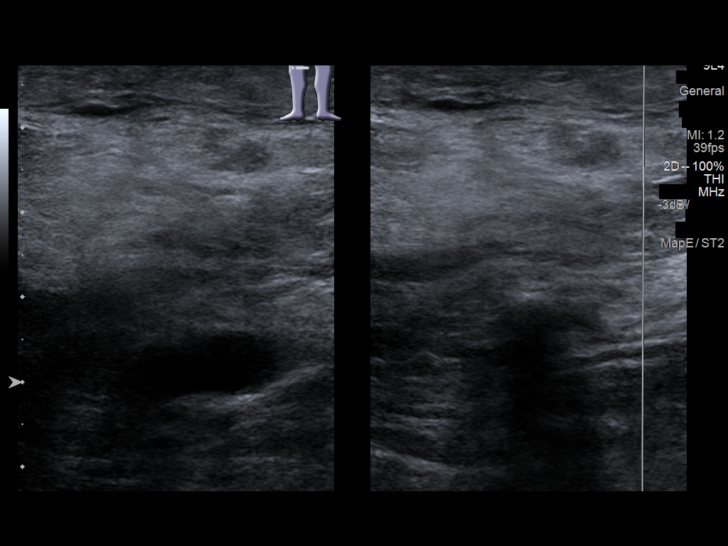
[im 27/33]
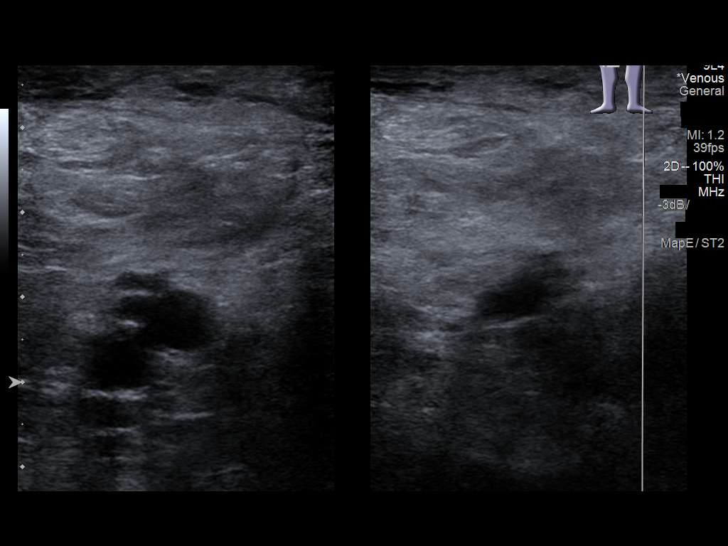
[im 30/33]
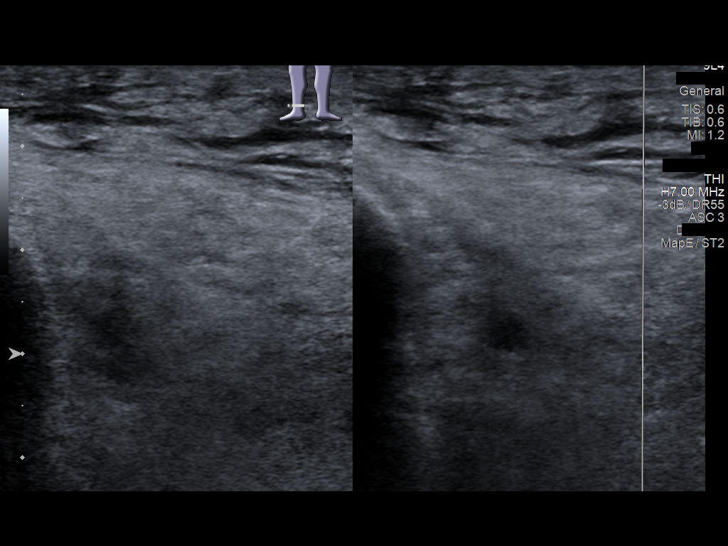
[im 33/33]
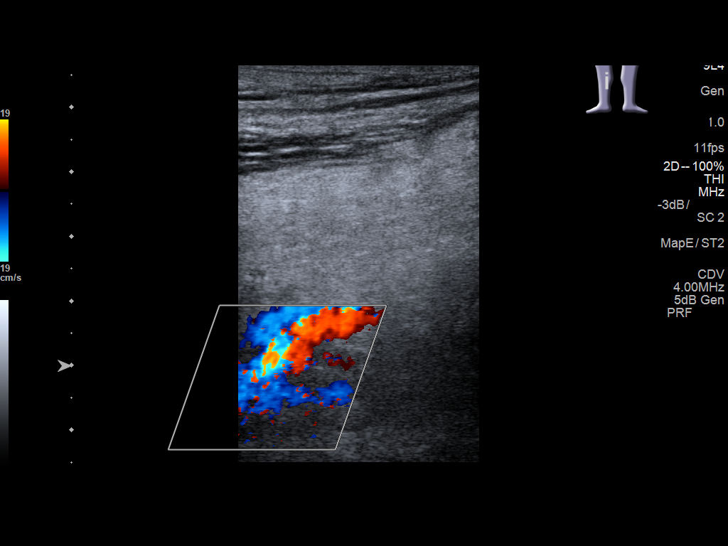

[13 of 24 positions shown; findings below may reference images not displayed]

FINDINGS: Contralateral Common Femoral Vein: Respiratory phasicity is normal
and symmetric with the symptomatic side. No evidence of thrombus.
Normal compressibility.

Common Femoral Vein: No evidence of thrombus. Normal
compressibility, respiratory phasicity and response to augmentation.

Saphenofemoral Junction: No evidence of thrombus. Normal
compressibility and flow on color Doppler imaging.

Profunda Femoral Vein: No evidence of thrombus. Normal
compressibility and flow on color Doppler imaging.

Femoral Vein: No evidence of thrombus. Normal compressibility,
respiratory phasicity and response to augmentation.

Popliteal Vein: No evidence of thrombus. Normal compressibility,
respiratory phasicity and response to augmentation.

Calf Veins: No evidence of thrombus. Normal compressibility and flow
on color Doppler imaging.

Superficial Great Saphenous Vein: No evidence of thrombus. Normal
compressibility and flow on color Doppler imaging.

Venous Reflux:  None.

Other Findings: There is a minimal amount of subcutaneous edema at
the level the right lower leg.
IMPRESSION: No evidence of DVT within the right lower extremity.

## 2019-08-02 ENCOUNTER — Other Ambulatory Visit: Payer: Self-pay

## 2019-08-02 ENCOUNTER — Ambulatory Visit: Payer: Medicaid Other | Admitting: Internal Medicine

## 2019-08-02 ENCOUNTER — Encounter: Payer: Self-pay | Admitting: Internal Medicine

## 2019-08-02 VITALS — BP 129/75 | HR 85 | Temp 97.5°F | Resp 16 | Ht 72.0 in | Wt 146.0 lb

## 2019-08-02 DIAGNOSIS — G4734 Idiopathic sleep related nonobstructive alveolar hypoventilation: Secondary | ICD-10-CM | POA: Diagnosis not present

## 2019-08-02 DIAGNOSIS — R0602 Shortness of breath: Secondary | ICD-10-CM

## 2019-08-02 DIAGNOSIS — J449 Chronic obstructive pulmonary disease, unspecified: Secondary | ICD-10-CM | POA: Diagnosis not present
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
## 2019-09-12 ENCOUNTER — Telehealth: Payer: Self-pay

## 2019-09-12 NOTE — Telephone Encounter (Signed)
Spoke with patient, he uses [employer] for his oxygen company, will fax over his overnight ox test and re cert notes. Beth

## 2019-09-29 ENCOUNTER — Telehealth: Payer: Self-pay

## 2019-09-29 NOTE — Telephone Encounter (Signed)
Called lmom informing patient of appointment on 10/04/2019. klh 

## 2019-10-04 ENCOUNTER — Ambulatory Visit: Payer: Medicaid Other | Admitting: Internal Medicine

## 2019-10-04 ENCOUNTER — Telehealth: Payer: Self-pay

## 2019-10-04 NOTE — Telephone Encounter (Signed)
Patient cancelled appointment on 10/04/2019 will call back to reschedule. klh

## 2019-10-13 NOTE — Progress Notes (Signed)
Scanned Overnight Ox results. Beth 

## 2019-11-10 ENCOUNTER — Other Ambulatory Visit: Payer: Self-pay | Admitting: Adult Health

## 2019-11-10 DIAGNOSIS — J449 Chronic obstructive pulmonary disease, unspecified: Secondary | ICD-10-CM

## 2019-11-14 ENCOUNTER — Other Ambulatory Visit: Payer: Self-pay | Admitting: Adult Health

## 2019-11-14 DIAGNOSIS — J449 Chronic obstructive pulmonary disease, unspecified: Secondary | ICD-10-CM

## 2019-12-04 ENCOUNTER — Other Ambulatory Visit: Payer: Self-pay | Admitting: Adult Health

## 2019-12-04 DIAGNOSIS — J449 Chronic obstructive pulmonary disease, unspecified: Secondary | ICD-10-CM

## 2019-12-12 ENCOUNTER — Other Ambulatory Visit: Payer: Self-pay | Admitting: Adult Health

## 2019-12-12 DIAGNOSIS — F17219 Nicotine dependence, cigarettes, with unspecified nicotine-induced disorders: Secondary | ICD-10-CM

## 2019-12-14 NOTE — Telephone Encounter (Signed)
Ok to fill this med?

## 2020-01-09 ENCOUNTER — Other Ambulatory Visit: Payer: Self-pay | Admitting: Adult Health

## 2020-01-09 DIAGNOSIS — F17219 Nicotine dependence, cigarettes, with unspecified nicotine-induced disorders: Secondary | ICD-10-CM

## 2020-01-18 ENCOUNTER — Other Ambulatory Visit: Payer: Self-pay | Admitting: Internal Medicine

## 2020-01-18 DIAGNOSIS — J449 Chronic obstructive pulmonary disease, unspecified: Secondary | ICD-10-CM

## 2020-01-18 NOTE — Progress Notes (Signed)
pft  

## 2020-02-01 ENCOUNTER — Ambulatory Visit: Payer: Medicaid Other | Admitting: Internal Medicine

## 2020-03-08 ENCOUNTER — Emergency Department: Payer: Medicaid Other

## 2020-03-08 ENCOUNTER — Inpatient Hospital Stay
Admission: EM | Admit: 2020-03-08 | Discharge: 2020-03-15 | DRG: 480 | Disposition: A | Discharge status: Home / Self Care | Payer: Medicaid Other | Attending: Internal Medicine | Admitting: Internal Medicine

## 2020-03-08 ENCOUNTER — Other Ambulatory Visit: Payer: Self-pay

## 2020-03-08 DIAGNOSIS — B1920 Unspecified viral hepatitis C without hepatic coma: Secondary | ICD-10-CM | POA: Diagnosis present

## 2020-03-08 DIAGNOSIS — F419 Anxiety disorder, unspecified: Secondary | ICD-10-CM | POA: Diagnosis present

## 2020-03-08 DIAGNOSIS — F319 Bipolar disorder, unspecified: Secondary | ICD-10-CM | POA: Diagnosis present

## 2020-03-08 DIAGNOSIS — J690 Pneumonitis due to inhalation of food and vomit: Secondary | ICD-10-CM | POA: Diagnosis not present

## 2020-03-08 DIAGNOSIS — F1010 Alcohol abuse, uncomplicated: Secondary | ICD-10-CM | POA: Diagnosis present

## 2020-03-08 DIAGNOSIS — M1990 Unspecified osteoarthritis, unspecified site: Secondary | ICD-10-CM | POA: Diagnosis present

## 2020-03-08 DIAGNOSIS — J431 Panlobular emphysema: Secondary | ICD-10-CM | POA: Diagnosis not present

## 2020-03-08 DIAGNOSIS — D6959 Other secondary thrombocytopenia: Secondary | ICD-10-CM | POA: Diagnosis not present

## 2020-03-08 DIAGNOSIS — Z419 Encounter for procedure for purposes other than remedying health state, unspecified: Secondary | ICD-10-CM

## 2020-03-08 DIAGNOSIS — D696 Thrombocytopenia, unspecified: Secondary | ICD-10-CM

## 2020-03-08 DIAGNOSIS — K219 Gastro-esophageal reflux disease without esophagitis: Secondary | ICD-10-CM | POA: Diagnosis present

## 2020-03-08 DIAGNOSIS — Z888 Allergy status to other drugs, medicaments and biological substances status: Secondary | ICD-10-CM

## 2020-03-08 DIAGNOSIS — Z9981 Dependence on supplemental oxygen: Secondary | ICD-10-CM

## 2020-03-08 DIAGNOSIS — M5136 Other intervertebral disc degeneration, lumbar region: Secondary | ICD-10-CM | POA: Diagnosis present

## 2020-03-08 DIAGNOSIS — W010XXA Fall on same level from slipping, tripping and stumbling without subsequent striking against object, initial encounter: Secondary | ICD-10-CM | POA: Diagnosis present

## 2020-03-08 DIAGNOSIS — Z79899 Other long term (current) drug therapy: Secondary | ICD-10-CM

## 2020-03-08 DIAGNOSIS — Y92009 Unspecified place in unspecified non-institutional (private) residence as the place of occurrence of the external cause: Secondary | ICD-10-CM

## 2020-03-08 DIAGNOSIS — D62 Acute posthemorrhagic anemia: Secondary | ICD-10-CM | POA: Diagnosis not present

## 2020-03-08 DIAGNOSIS — H409 Unspecified glaucoma: Secondary | ICD-10-CM | POA: Diagnosis present

## 2020-03-08 DIAGNOSIS — S72142A Displaced intertrochanteric fracture of left femur, initial encounter for closed fracture: Principal | ICD-10-CM | POA: Diagnosis present

## 2020-03-08 DIAGNOSIS — S72009A Fracture of unspecified part of neck of unspecified femur, initial encounter for closed fracture: Secondary | ICD-10-CM

## 2020-03-08 DIAGNOSIS — H5461 Unqualified visual loss, right eye, normal vision left eye: Secondary | ICD-10-CM | POA: Diagnosis present

## 2020-03-08 DIAGNOSIS — D509 Iron deficiency anemia, unspecified: Secondary | ICD-10-CM | POA: Diagnosis not present

## 2020-03-08 DIAGNOSIS — I9589 Other hypotension: Secondary | ICD-10-CM | POA: Diagnosis not present

## 2020-03-08 DIAGNOSIS — E860 Dehydration: Secondary | ICD-10-CM | POA: Diagnosis not present

## 2020-03-08 DIAGNOSIS — Z20822 Contact with and (suspected) exposure to covid-19: Secondary | ICD-10-CM | POA: Diagnosis present

## 2020-03-08 DIAGNOSIS — Y907 Blood alcohol level of 200-239 mg/100 ml: Secondary | ICD-10-CM | POA: Diagnosis present

## 2020-03-08 DIAGNOSIS — E876 Hypokalemia: Secondary | ICD-10-CM | POA: Diagnosis not present

## 2020-03-08 DIAGNOSIS — J440 Chronic obstructive pulmonary disease with acute lower respiratory infection: Secondary | ICD-10-CM | POA: Diagnosis not present

## 2020-03-08 DIAGNOSIS — M549 Dorsalgia, unspecified: Secondary | ICD-10-CM | POA: Diagnosis present

## 2020-03-08 DIAGNOSIS — I959 Hypotension, unspecified: Secondary | ICD-10-CM

## 2020-03-08 DIAGNOSIS — F1020 Alcohol dependence, uncomplicated: Secondary | ICD-10-CM | POA: Diagnosis present

## 2020-03-08 DIAGNOSIS — G8929 Other chronic pain: Secondary | ICD-10-CM | POA: Diagnosis present

## 2020-03-08 DIAGNOSIS — F17210 Nicotine dependence, cigarettes, uncomplicated: Secondary | ICD-10-CM | POA: Diagnosis present

## 2020-03-08 DIAGNOSIS — S72002A Fracture of unspecified part of neck of left femur, initial encounter for closed fracture: Secondary | ICD-10-CM | POA: Diagnosis present

## 2020-03-08 DIAGNOSIS — Z7951 Long term (current) use of inhaled steroids: Secondary | ICD-10-CM

## 2020-03-08 DIAGNOSIS — I739 Peripheral vascular disease, unspecified: Secondary | ICD-10-CM | POA: Diagnosis present

## 2020-03-08 DIAGNOSIS — E875 Hyperkalemia: Secondary | ICD-10-CM | POA: Diagnosis not present

## 2020-03-08 DIAGNOSIS — Z8701 Personal history of pneumonia (recurrent): Secondary | ICD-10-CM

## 2020-03-08 DIAGNOSIS — J45909 Unspecified asthma, uncomplicated: Secondary | ICD-10-CM | POA: Diagnosis present

## 2020-03-08 DIAGNOSIS — J449 Chronic obstructive pulmonary disease, unspecified: Secondary | ICD-10-CM | POA: Diagnosis present

## 2020-03-08 DIAGNOSIS — K08109 Complete loss of teeth, unspecified cause, unspecified class: Secondary | ICD-10-CM | POA: Diagnosis present

## 2020-03-08 LAB — BASIC METABOLIC PANEL
Anion gap: 13 (ref 5–15)
BUN: 7 mg/dL (ref 6–20)
CO2: 22 mmol/L (ref 22–32)
Calcium: 8.4 mg/dL — ABNORMAL LOW (ref 8.9–10.3)
Chloride: 105 mmol/L (ref 98–111)
Creatinine, Ser: 0.67 mg/dL (ref 0.61–1.24)
GFR calc Af Amer: >60 mL/min (ref >60)
GFR calc non Af Amer: >60 mL/min (ref >60)
Glucose, Bld: 89 mg/dL (ref 70–99)
Potassium: 3.7 mmol/L (ref 3.5–5.1)
Sodium: 140 mmol/L (ref 135–145)

## 2020-03-08 LAB — COMPREHENSIVE METABOLIC PANEL
ALT: 27 U/L (ref 0–44)
AST: 65 U/L — ABNORMAL HIGH (ref 15–41)
Albumin: 3.4 g/dL — ABNORMAL LOW (ref 3.5–5.0)
Alkaline Phosphatase: 114 U/L (ref 38–126)
Anion gap: 12 (ref 5–15)
BUN: 7 mg/dL (ref 6–20)
CO2: 24 mmol/L (ref 22–32)
Calcium: 8.2 mg/dL — ABNORMAL LOW (ref 8.9–10.3)
Chloride: 106 mmol/L (ref 98–111)
Creatinine, Ser: 0.61 mg/dL (ref 0.61–1.24)
GFR calc Af Amer: >60 mL/min (ref >60)
GFR calc non Af Amer: >60 mL/min (ref >60)
Glucose, Bld: 84 mg/dL (ref 70–99)
Potassium: 3.5 mmol/L (ref 3.5–5.1)
Sodium: 142 mmol/L (ref 135–145)
Total Bilirubin: 1 mg/dL (ref 0.3–1.2)
Total Protein: 6.6 g/dL (ref 6.5–8.1)

## 2020-03-08 LAB — PROTIME-INR
INR: 1 (ref 0.8–1.2)
Prothrombin Time: 12.5 seconds (ref 11.4–15.2)

## 2020-03-08 LAB — CBC WITH DIFFERENTIAL/PLATELET
Abs Immature Granulocytes: 0.03 10*3/uL (ref 0.00–0.07)
Basophils Absolute: 0.1 10*3/uL (ref 0.0–0.1)
Basophils Relative: 1 %
Eosinophils Absolute: 0.3 10*3/uL (ref 0.0–0.5)
Eosinophils Relative: 3 %
HCT: 35.2 % — ABNORMAL LOW (ref 39.0–52.0)
Hemoglobin: 12.1 g/dL — ABNORMAL LOW (ref 13.0–17.0)
Immature Granulocytes: 0 %
Lymphocytes Relative: 19 %
Lymphs Abs: 1.7 10*3/uL (ref 0.7–4.0)
MCH: 33.9 pg (ref 26.0–34.0)
MCHC: 34.4 g/dL (ref 30.0–36.0)
MCV: 98.6 fL (ref 80.0–100.0)
Monocytes Absolute: 0.6 10*3/uL (ref 0.1–1.0)
Monocytes Relative: 7 %
Neutro Abs: 6.3 10*3/uL (ref 1.7–7.7)
Neutrophils Relative %: 70 %
Platelets: 199 10*3/uL (ref 150–400)
RBC: 3.57 MIL/uL — ABNORMAL LOW (ref 4.22–5.81)
RDW: 12.4 % (ref 11.5–15.5)
WBC: 9 10*3/uL (ref 4.0–10.5)
nRBC: 0 % (ref 0.0–0.2)

## 2020-03-08 LAB — APTT: aPTT: 28 seconds (ref 24–36)

## 2020-03-08 LAB — CBC
HCT: 36.3 % — ABNORMAL LOW (ref 39.0–52.0)
Hemoglobin: 11.9 g/dL — ABNORMAL LOW (ref 13.0–17.0)
MCH: 33.4 pg (ref 26.0–34.0)
MCHC: 32.8 g/dL (ref 30.0–36.0)
MCV: 102 fL — ABNORMAL HIGH (ref 80.0–100.0)
Platelets: 196 10*3/uL (ref 150–400)
RBC: 3.56 MIL/uL — ABNORMAL LOW (ref 4.22–5.81)
RDW: 12.3 % (ref 11.5–15.5)
WBC: 10.6 10*3/uL — ABNORMAL HIGH (ref 4.0–10.5)
nRBC: 0 % (ref 0.0–0.2)

## 2020-03-08 LAB — SARS CORONAVIRUS 2 BY RT PCR (HOSPITAL ORDER, PERFORMED IN ~~LOC~~ HOSPITAL LAB): SARS Coronavirus 2: NEGATIVE

## 2020-03-08 LAB — ETHANOL: Alcohol, Ethyl (B): 239 mg/dL — ABNORMAL HIGH (ref <10)

## 2020-03-08 LAB — MAGNESIUM: Magnesium: 1.4 mg/dL — ABNORMAL LOW (ref 1.7–2.4)

## 2020-03-08 LAB — PHOSPHORUS: Phosphorus: 4.4 mg/dL (ref 2.5–4.6)

## 2020-03-08 MED ORDER — DEXTROSE-NACL 5-0.9 % IV SOLN: INTRAVENOUS | Status: DC

## 2020-03-08 MED ORDER — LORAZEPAM 1 MG PO TABS
1.0000 mg | ORAL_TABLET | ORAL | Status: AC | PRN
  Administered 2020-03-10: 1 mg via ORAL
  Filled 2020-03-08: qty 1

## 2020-03-08 MED ORDER — METHOCARBAMOL 500 MG PO TABS
500.0000 mg | ORAL_TABLET | Freq: Four times a day (QID) | ORAL | Status: DC | PRN
  Administered 2020-03-08 – 2020-03-15 (×12): 500 mg via ORAL
  Filled 2020-03-08 (×12): qty 1

## 2020-03-08 MED ORDER — ONDANSETRON HCL 4 MG PO TABS: 4.0000 mg | ORAL_TABLET | Freq: Four times a day (QID) | ORAL | Status: DC | PRN

## 2020-03-08 MED ORDER — SODIUM CHLORIDE 0.9 % IV SOLN: Freq: Once | INTRAVENOUS | Status: AC

## 2020-03-08 MED ORDER — THIAMINE HCL 100 MG PO TABS
100.0000 mg | ORAL_TABLET | Freq: Every day | ORAL | Status: DC
  Administered 2020-03-08 – 2020-03-15 (×7): 100 mg via ORAL
  Filled 2020-03-08 (×7): qty 1

## 2020-03-08 MED ORDER — FOLIC ACID 1 MG PO TABS
1.0000 mg | ORAL_TABLET | Freq: Every day | ORAL | Status: DC
  Administered 2020-03-08 – 2020-03-15 (×7): 1 mg via ORAL
  Filled 2020-03-08 (×8): qty 1

## 2020-03-08 MED ORDER — HYDROMORPHONE HCL 1 MG/ML IJ SOLN
0.5000 mg | INTRAMUSCULAR | Status: DC | PRN
  Administered 2020-03-08 – 2020-03-09 (×7): 1 mg via INTRAVENOUS
  Filled 2020-03-08 (×7): qty 1

## 2020-03-08 MED ORDER — MORPHINE SULFATE (PF) 4 MG/ML IV SOLN
4.0000 mg | Freq: Once | INTRAVENOUS | Status: AC
  Administered 2020-03-08: 4 mg via INTRAMUSCULAR
  Filled 2020-03-08: qty 1

## 2020-03-08 MED ORDER — ADULT MULTIVITAMIN W/MINERALS CH
1.0000 | ORAL_TABLET | Freq: Every day | ORAL | Status: DC
  Administered 2020-03-08 – 2020-03-15 (×7): 1 via ORAL
  Filled 2020-03-08 (×7): qty 1

## 2020-03-08 MED ORDER — THIAMINE HCL 100 MG/ML IJ SOLN
100.0000 mg | Freq: Every day | INTRAMUSCULAR | Status: DC
  Filled 2020-03-08 (×5): qty 2

## 2020-03-08 MED ORDER — ACETAMINOPHEN 650 MG RE SUPP: 650.0000 mg | Freq: Four times a day (QID) | RECTAL | Status: DC | PRN

## 2020-03-08 MED ORDER — ACETAMINOPHEN 325 MG PO TABS
650.0000 mg | ORAL_TABLET | Freq: Four times a day (QID) | ORAL | Status: DC | PRN
  Administered 2020-03-09 – 2020-03-11 (×2): 650 mg via ORAL
  Filled 2020-03-08: qty 2

## 2020-03-08 MED ORDER — LORAZEPAM 2 MG/ML IJ SOLN
1.0000 mg | INTRAMUSCULAR | Status: AC | PRN
  Administered 2020-03-09: 2 mg via INTRAVENOUS
  Filled 2020-03-08: qty 1

## 2020-03-08 MED ORDER — ONDANSETRON HCL 4 MG/2ML IJ SOLN: 4.0000 mg | Freq: Four times a day (QID) | INTRAMUSCULAR | Status: DC | PRN

## 2020-03-08 MED ORDER — CEFAZOLIN SODIUM-DEXTROSE 2-4 GM/100ML-% IV SOLN
2.0000 g | INTRAVENOUS | Status: AC
  Administered 2020-03-09: 2 g via INTRAVENOUS
  Filled 2020-03-08: qty 100
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
## 2020-03-08 NOTE — Consult Note (Signed)
ORTHOPAEDIC CONSULTATION  REQUESTING PHYSICIAN: Rometta EmeryGarba, Mohammad L, MD  Chief Complaint: Left hip pain status post fall  HPI: Perry Bullock is a 59 y.o. male with a history of bipolar, COPD, hepatitis C, peripheral vascular disease and alcohol abuse who presents today with left hip pain after the patient reported mechanical fall while going to the bathroom.  Patient fell onto his left side.  He was unable to stand or ambulate following this injury.  Patient is seen in the hallway in the ER with a blanket over his head.  He is complaining of significant pain in the left hip.  Patient's blood alcohol level is 239 on admission.  His drug screen is positive for cannabis.  Past Medical History:  Diagnosis Date  . Anxiety   . Arthritis   . Asthma   . Bipolar 1 disorder (HCC)   . Blind right eye   . Broken hip (HCC)   . Chronic back pain    Diverticulosis  . Chronic headaches   . Convulsion (HCC)    once a month when stands up too quickly  . COPD (chronic obstructive pulmonary disease) (HCC)   . DDD (degenerative disc disease), lumbar   . Diverticulitis   . GERD (gastroesophageal reflux disease)   . Glaucoma   . Hepatitis    hepatitis C  . Pneumonia   . PVD (peripheral vascular disease) (HCC)    lower extremities reddened and feet swollen  . Seasonal allergies   . Shortness of breath dyspnea    Past Surgical History:  Procedure Laterality Date  . EYE SURGERY Right   . INTRAMEDULLARY (IM) NAIL INTERTROCHANTERIC Right 01/16/2017   Procedure: INTRAMEDULLARY (IM) NAIL INTERTROCHANTRIC;  Surgeon: Christena FlakePoggi, John J, MD;  Location: [facility] ORS;  Service: Orthopedics;  Laterality: Right;  . INTUBATION-ENDOTRACHEAL WITH TRACHEOSTOMY STANDBY N/A 04/22/2015   Procedure: INTUBATION-ENDOTRACHEAL WITH TRACHEOSTOMY STANDBY;  Surgeon: Linus Salmonshapman McQueen, MD;  Location: [facility] ORS;  Service: ENT;  Laterality: N/A;  . JOINT REPLACEMENT Left    Lt shoulder  . MANDIBLE SURGERY     Social History    Socioeconomic History  . Marital status: Single    Spouse name: Not on file  . Number of children: Not on file  . Years of education: Not on file  . Highest education level: Not on file  Occupational History  . Not on file  Tobacco Use  . Smoking status: Current Every Day Smoker    Packs/day: 0.00    Years: 35.00    Pack years: 0.00    Types: Cigarettes  . Smokeless tobacco: Never Used  . Tobacco comment: 4 cigs daily--02/27/16, pt has patch  Substance and Sexual Activity  . Alcohol use: Yes    Alcohol/week: 3.0 standard drinks    Types: 3 Cans of beer per week    Comment: pt is cutting back to 3-4 daily  . Drug use: No  . Sexual activity: Yes    Birth control/protection: None  Other Topics Concern  . Not on file  Social History Narrative  . Not on file   Social Determinants of Health   Financial Resource Strain:   . Difficulty of Paying Living Expenses: Not on file  Food Insecurity:   . Worried About Programme researcher, broadcasting/film/videounning Out of Food in the Last Year: Not on file  . Ran Out of Food in the Last Year: Not on file  Transportation Needs:   . Lack of Transportation (Medical): Not on file  . Lack of Transportation (Non-Medical):  Not on file  Physical Activity:   . Days of Exercise per Week: Not on file  . Minutes of Exercise per Session: Not on file  Stress:   . Feeling of Stress : Not on file  Social Connections:   . Frequency of Communication with Friends and Family: Not on file  . Frequency of Social Gatherings with Friends and Family: Not on file  . Attends Religious Services: Not on file  . Active Member of Clubs or Organizations: Not on file  . Attends Banker Meetings: Not on file  . Marital Status: Not on file   Family History  Problem Relation Age of Onset  . Breast cancer Mother   . Hypertension Father   . Diabetes Father    Allergies  Allergen Reactions  . Ace Inhibitors Swelling    Angioedema 10/16 requiring intubation @ OSH 2/2 pt reportedly  taking someone else's Lisinopril.  Angioedema 10/16 requiring intubation @ OSH 2/2 pt reportedly taking someone else's Lisinopril.     Prior to Admission medications   Medication Sig Start Date End Date Taking? Authorizing Provider  allopurinol (ZYLOPRIM) 100 MG tablet Take 1 tablet (100 mg total) by mouth daily. 11/12/17  Yes Enedina Finner, MD  cetirizine (ZYRTEC) 10 MG tablet Take 10 mg by mouth daily.   Yes [provider]  DULERA 200-5 MCG/ACT AERO INHALE 2 PUFFS INTO LUNGS TWICE DAILY 11/14/19  Yes Scarboro, Coralee North, NP  gabapentin (NEURONTIN) 600 MG tablet Take 600 mg by mouth 3 (three) times daily.    Yes [provider]  pantoprazole (PROTONIX) 40 MG tablet Take 1 tablet (40 mg total) by mouth daily. 11/11/17  Yes Enedina Finner, MD  QUEtiapine (SEROQUEL) 200 MG tablet Take 200 mg by mouth at bedtime.    Yes [provider]  tiotropium (SPIRIVA HANDIHALER) 18 MCG inhalation capsule INHALE THE CONTENTS OF ONE CAPSULE BY MOUTH ONCE DAILY 03/31/19  Yes Scarboro, Coralee North, NP  traZODone (DESYREL) 100 MG tablet Take 100-200 mg by mouth at bedtime.    Yes [provider]  albuterol (PROVENTIL) (2.5 MG/3ML) 0.083% nebulizer solution Take 3 mLs (2.5 mg total) by nebulization every 6 (six) hours as needed for wheezing or shortness of breath. 10/12/18   Johnna Acosta, NP  fluticasone (FLONASE) 50 MCG/ACT nasal spray Place 2 sprays into both nostrils as needed.     [provider]  nicotine (NICODERM CQ - DOSED IN MG/24 HOURS) 21 mg/24hr patch APPLY 1 PATCH TOPICALLY TO SKIN ONCE DAILY Patient not taking: Reported on 03/08/2020 01/09/20   Johnna Acosta, NP  nicotine (NICODERM CQ) 14 mg/24hr patch Place 1 patch (14 mg total) onto the skin daily. Patient not taking: Reported on 03/08/2020 04/08/18   Johnna Acosta, NP  ondansetron (ZOFRAN) 4 MG tablet Take 1 tablet (4 mg total) by mouth every 8 (eight) hours as needed for nausea. Patient not taking: Reported on  03/08/2020 04/06/15   Enedina Finner, MD  oxyCODONE (OXY IR/ROXICODONE) 5 MG immediate release tablet Take 1-2 tablets (5-10 mg total) by mouth every 4 (four) hours as needed for moderate pain. Patient not taking: Reported on 03/08/2020 01/18/17   Evon Slack, PA-C  PROAIR HFA 108 [phone] Base) MCG/ACT inhaler INHALE 2 PUFFS INTO LUNGS EVERY 6 HOURS AS NEEDED FOR WHEEZING OR SHORTNESS OF BREATH 12/05/19   Johnna Acosta, NP   DG Chest 1 View  Result Date: 03/08/2020 CLINICAL DATA:  Preop EXAM: CHEST  1  VIEW COMPARISON:  January 13, 2019 FINDINGS: There is an old right clavicle fracture. The patient is status post prior left total shoulder arthroplasty. There appears to be stable lucency about the left shoulder prosthesis. There is stable pleural thickening along the mid left chest wall. Stable coarse airspace opacities are noted throughout both lung fields most evident in the right upper lobe. There are old healed left-sided rib fractures. There is no pneumothorax. The heart size is stable. Aortic calcifications are noted. IMPRESSION: No active disease. Electronically Signed   By: Katherine Mantle M.D.   On: 03/08/2020 16:20   CT Head Wo Contrast  Result Date: 03/08/2020 CLINICAL DATA:  Pain after fall. EXAM: CT HEAD WITHOUT CONTRAST TECHNIQUE: Contiguous axial images were obtained from the base of the skull through the vertex without intravenous contrast. COMPARISON:  Most recent head CT 01/15/2017 FINDINGS: Brain: Generalized atrophy is similar to prior exam, advanced for age. Mild periventricular chronic small vessel ischemia. No intracranial hemorrhage, mass effect, or midline shift. No hydrocephalus. The basilar cisterns are patent. No evidence of territorial infarct or acute ischemia. No extra-axial or intracranial fluid collection. Vascular: No hyperdense vessel. Skull: No fracture or focal lesion. Sinuses/Orbits: No acute findings. Chronic opacification of lower lateral left mastoid air cells. Remote medial  left orbital wall fracture. Remote nasal bone fracture with rightward nasal septal bowing. Other: None. IMPRESSION: 1. No acute intracranial abnormality. No skull fracture. 2. Unchanged atrophy, age advanced. Electronically Signed   By: Narda Rutherford M.D.   On: 03/08/2020 16:32   CT Cervical Spine Wo Contrast  Result Date: 03/08/2020 CLINICAL DATA:  Pain after fall. EXAM: CT CERVICAL SPINE WITHOUT CONTRAST TECHNIQUE: Multidetector CT imaging of the cervical spine was performed without intravenous contrast. Multiplanar CT image reconstructions were also generated. COMPARISON:  None. FINDINGS: Alignment: No evidence of traumatic malalignment. 3 mm anterolisthesis of C4 on C5, 2 mm retrolisthesis of C5 on C6 appears degenerative. Mild straightening of normal lordosis. Skull base and vertebrae: No acute fracture. Modic endplate changes at C5-C6. The dens and skull base are intact. Soft tissues and spinal canal: No prevertebral fluid or swelling. No visible canal hematoma. Disc levels: Diffuse degenerative disc disease, most prominent at C5-C6 where there is near complete disc space loss suggestion Modic endplate changes. Multilevel facet hypertrophy, prominent at C3-C4 on the right and CT C3 on the left. Multilevel neural foraminal stenosis. Upper chest: Stable appearance of cavitary focus in the right lung apex from 02/01/2019 chest CT. No acute findings. Other: None. IMPRESSION: 1. No acute fracture or subluxation of the cervical spine. 2. Multilevel degenerative disc disease and facet hypertrophy. Electronically Signed   By: Narda Rutherford M.D.   On: 03/08/2020 16:39   DG Hip Unilat W or Wo Pelvis 2-3 Views Left  Result Date: 03/08/2020 CLINICAL DATA:  Larey Seat, left hip pain EXAM: DG HIP (WITH OR WITHOUT PELVIS) 2-3V LEFT COMPARISON:  None. FINDINGS: Frontal view of the pelvis as well as frontal and cross-table lateral views of the left hip are obtained. There is a comminuted intertrochanteric left hip  fracture with varus angulation and impaction at the fracture site. No dislocation. Chronic postsurgical changes are seen within the right hip and femur. The remainder of the bony pelvis is unremarkable. Moderate spondylosis and facet hypertrophy at the lumbosacral junction. IMPRESSION: 1. Comminuted impacted intertrochanteric left hip fracture. Electronically Signed   By: Sharlet Salina M.D.   On: 03/08/2020 16:16    Positive ROS: All other systems have been  reviewed and were otherwise negative with the exception of those mentioned in the HPI and as above.  Physical Exam: General: Awake, no acute distress  MUSCULOSKELETAL: Left lower extremity: Patient skin is intact overlying the left hip.  His left lower extremity is shortened and externally rotated.  He has palpable pedal pulses, intact sensation light touch is intact motor function distally.  He can flex and extend his toes and dorsiflex and plantarflex his ankle.  Assessment: Displaced left intertrochanteric hip fracture  Plan: Patient has a history of a previous intramedullary fixation for right intertrochanteric hip fracture by Dr. Joice Lofts in 2018.  I explained to the patient that he will undergo the same operation in the left hip now.  Patient is intoxicated and agitated.  He is admitted to the hospital service for preop evaluation.  He will need to be on the CIWA protocol given his history of alcohol abuse.  Patient will be n.p.o. after midnight.  His surgery will be scheduled tomorrow afternoon.  Patient should not receive anticoagulation therapy tonight in preparation for surgery tomorrow.  Patient been ordered for teds and SCDs.   Juanell Fairly, MD    03/08/2020 7:24 PM

## 2020-03-08 NOTE — ED Notes (Signed)
Pt to xray

## 2020-03-08 NOTE — H&P (Signed)
History and Physical   Perry Bullock WPY:[id number] DOB: Nov 20, 1960 DOA: 03/08/2020  Referring MD/NP/PA: Dr. Erma Heritage  PCP: [facility]   Outpatient Specialists: None  Patient coming from: Home  Chief Complaint: Fall and confusion  HPI: Perry Bullock is a 59 y.o. male with medical history significant of alcohol abuse with recurrent hospitalization, bipolar disorder, anxiety disorder, asthma, previous multiple fractures, COPD, hepatitis C, peripheral vascular disease and degenerative disc disease who has had previous right femoral neck fracture from a fall.  Patient came in to the ER today.  Not really communicating not adequately able to give history.  He appears intoxicated and moaning in pain.  He apparently had a fall probably intoxicated.  Patient has fracture of the left femoral neck.  Orthopedics consulted and we are admitting the patient to the hospital for medical clearance.  Prior to surgery..  ED Course: Temperature 98.9 blood pressure 106/89 pulse 116 respiratory rate of 14 oxygen sat 94% on room air.  White count is 10.6 hemoglobin 11.9 and platelets 196.  Chemistry appears to be within normal except for calcium 8.2 glucose 84.  PT 12.5 INR 1.0.  Glucose 89 COVID-19 screen is negative alcohol level 239.  CT cervical spine head CT without contrast well negative.  Chest x-ray shows no active disease.  X-ray of the left hip showed commuted impacted intertrochanteric left hip fracture.  Ortho consulted and patient being admitted for further evaluation and treatment.  Review of Systems: As per HPI otherwise 10 point review of systems negative.    Past Medical History:  Diagnosis Date  . Anxiety   . Arthritis   . Asthma   . Bipolar 1 disorder (HCC)   . Blind right eye   . Broken hip (HCC)   . Chronic back pain    Diverticulosis  . Chronic headaches   . Convulsion (HCC)    once a month when stands up too quickly  . COPD (chronic obstructive pulmonary disease)  (HCC)   . DDD (degenerative disc disease), lumbar   . Diverticulitis   . GERD (gastroesophageal reflux disease)   . Glaucoma   . Hepatitis    hepatitis C  . Pneumonia   . PVD (peripheral vascular disease) (HCC)    lower extremities reddened and feet swollen  . Seasonal allergies   . Shortness of breath dyspnea     Past Surgical History:  Procedure Laterality Date  . EYE SURGERY Right   . INTRAMEDULLARY (IM) NAIL INTERTROCHANTERIC Right 01/16/2017   Procedure: INTRAMEDULLARY (IM) NAIL INTERTROCHANTRIC;  Surgeon: Christena Flake, MD;  Location: [facility] ORS;  Service: Orthopedics;  Laterality: Right;  . INTUBATION-ENDOTRACHEAL WITH TRACHEOSTOMY STANDBY N/A 04/22/2015   Procedure: INTUBATION-ENDOTRACHEAL WITH TRACHEOSTOMY STANDBY;  Surgeon: Linus Salmons, MD;  Location: [facility] ORS;  Service: ENT;  Laterality: N/A;  . JOINT REPLACEMENT Left    Lt shoulder  . MANDIBLE SURGERY       reports that he has been smoking cigarettes. He has been smoking about 0.00 packs per day for the past 35.00 years. He has never used smokeless tobacco. He reports current alcohol use of about 3.0 standard drinks of alcohol per week. He reports that he does not use drugs.  Allergies  Allergen Reactions  . Ace Inhibitors Swelling    Angioedema 10/16 requiring intubation @ OSH 2/2 pt reportedly taking someone else's Lisinopril.  Angioedema 10/16 requiring intubation @ OSH 2/2 pt reportedly taking someone else's Lisinopril.      Family History  Problem Relation Age of Onset  . Breast cancer Mother   . Hypertension Father   . Diabetes Father      Prior to Admission medications   Medication Sig Start Date End Date Taking? Authorizing Provider  allopurinol (ZYLOPRIM) 100 MG tablet Take 1 tablet (100 mg total) by mouth daily. 11/12/17  Yes Enedina Finner, MD  cetirizine (ZYRTEC) 10 MG tablet Take 10 mg by mouth daily.   Yes [provider]  DULERA 200-5 MCG/ACT AERO INHALE 2 PUFFS INTO LUNGS TWICE DAILY  11/14/19  Yes Scarboro, Coralee North, NP  gabapentin (NEURONTIN) 600 MG tablet Take 600 mg by mouth 3 (three) times daily.    Yes [provider]  pantoprazole (PROTONIX) 40 MG tablet Take 1 tablet (40 mg total) by mouth daily. 11/11/17  Yes Enedina Finner, MD  QUEtiapine (SEROQUEL) 200 MG tablet Take 200 mg by mouth at bedtime.    Yes [provider]  tiotropium (SPIRIVA HANDIHALER) 18 MCG inhalation capsule INHALE THE CONTENTS OF ONE CAPSULE BY MOUTH ONCE DAILY 03/31/19  Yes Scarboro, Coralee North, NP  traZODone (DESYREL) 100 MG tablet Take 100-200 mg by mouth at bedtime.    Yes [provider]  albuterol (PROVENTIL) (2.5 MG/3ML) 0.083% nebulizer solution Take 3 mLs (2.5 mg total) by nebulization every 6 (six) hours as needed for wheezing or shortness of breath. 10/12/18   Johnna Acosta, NP  fluticasone (FLONASE) 50 MCG/ACT nasal spray Place 2 sprays into both nostrils as needed.     [provider]  nicotine (NICODERM CQ - DOSED IN MG/24 HOURS) 21 mg/24hr patch APPLY 1 PATCH TOPICALLY TO SKIN ONCE DAILY Patient not taking: Reported on 03/08/2020 01/09/20   Johnna Acosta, NP  nicotine (NICODERM CQ) 14 mg/24hr patch Place 1 patch (14 mg total) onto the skin daily. Patient not taking: Reported on 03/08/2020 04/08/18   Johnna Acosta, NP  ondansetron (ZOFRAN) 4 MG tablet Take 1 tablet (4 mg total) by mouth every 8 (eight) hours as needed for nausea. Patient not taking: Reported on 03/08/2020 04/06/15   Enedina Finner, MD  oxyCODONE (OXY IR/ROXICODONE) 5 MG immediate release tablet Take 1-2 tablets (5-10 mg total) by mouth every 4 (four) hours as needed for moderate pain. Patient not taking: Reported on 03/08/2020 01/18/17   Ronnette Juniper  [facility] HFA 108 [phone] Base) MCG/ACT inhaler INHALE 2 PUFFS INTO LUNGS EVERY 6 HOURS AS NEEDED FOR WHEEZING OR SHORTNESS OF BREATH 12/05/19   Johnna Acosta, NP    Physical Exam: Vitals:   03/08/20 2036 03/08/20 2150 03/08/20 2304 03/09/20 0008    BP: (!) 126/91  (!) 124/98 (!) 139/91  Pulse: (!) 109 (!) 113 (!) 116 (!) 113  Resp: 19  18 19   Temp:   98.9 F (37.2 C) 98.2 F (36.8 C)  TempSrc:   Oral Oral  SpO2: 94% 95% 99% 100%  Weight:      Height:          Constitutional: Acutely ill looking, agitated Vitals:   03/08/20 2036 03/08/20 2150 03/08/20 2304 03/09/20 0008  BP: (!) 126/91  (!) 124/98 (!) 139/91  Pulse: (!) 109 (!) 113 (!) 116 (!) 113  Resp: 19  18 19   Temp:   98.9 F (37.2 C) 98.2 F (36.8 C)  TempSrc:   Oral Oral  SpO2: 94% 95% 99% 100%  Weight:      Height:       Eyes: PERRL, lids and conjunctivae normal  ENMT: Mucous membranes are dry. Posterior pharynx clear of any exudate or lesions.Normal dentition.  Neck: normal, supple, no masses, no thyromegaly Respiratory: clear to auscultation bilaterally, no wheezing, no crackles. Normal respiratory effort. No accessory muscle use.  Cardiovascular: Sinus tachycardia, no murmurs / rubs / gallops. No extremity edema. 2+ pedal pulses. No carotid bruits.  Abdomen: no tenderness, no masses palpated. No hepatosplenomegaly. Bowel sounds positive.  Musculoskeletal: no clubbing / cyanosis.  Left lower extremity laterally rotated tender to touch with ecchymosis. Good ROM, no contractures. Normal muscle tone.  Skin: no rashes, lesions, ulcers. No induration Neurologic: CN 2-12 grossly intact. Sensation intact, DTR normal. Strength 5/5 in all 4.  Psychiatric: Confused, agitated, disoriented    Labs on Admission: I have personally reviewed following labs and imaging studies  CBC: Recent Labs  Lab 03/08/20 1740 03/08/20 2132  WBC 9.0 10.6*  NEUTROABS 6.3  --   HGB 12.1* 11.9*  HCT 35.2* 36.3*  MCV 98.6 102.0*  PLT 199 196   Basic Metabolic Panel: Recent Labs  Lab 03/08/20 1740 03/08/20 2132  NA 140 142  K 3.7 3.5  CL 105 106  CO2 22 24  GLUCOSE 89 84  BUN 7 7  CREATININE 0.67 0.61  CALCIUM 8.4* 8.2*  MG  --  1.4*  PHOS  --  4.4   GFR: Estimated  Creatinine Clearance: 102.7 mL/min (by C-G formula based on SCr of 0.61 mg/dL). Liver Function Tests: Recent Labs  Lab 03/08/20 2132  AST 65*  ALT 27  ALKPHOS 114  BILITOT 1.0  PROT 6.6  ALBUMIN 3.4*   No results for input(s): LIPASE, AMYLASE in the last 168 hours. No results for input(s): AMMONIA in the last 168 hours. Coagulation Profile: Recent Labs  Lab 03/08/20 1740  INR 1.0   Cardiac Enzymes: No results for input(s): CKTOTAL, CKMB, CKMBINDEX, TROPONINI in the last 168 hours. BNP (last 3 results) No results for input(s): PROBNP in the last 8760 hours. HbA1C: No results for input(s): HGBA1C in the last 72 hours. CBG: No results for input(s): GLUCAP in the last 168 hours. Lipid Profile: No results for input(s): CHOL, HDL, LDLCALC, TRIG, CHOLHDL, LDLDIRECT in the last 72 hours. Thyroid Function Tests: No results for input(s): TSH, T4TOTAL, FREET4, T3FREE, THYROIDAB in the last 72 hours. Anemia Panel: No results for input(s): VITAMINB12, FOLATE, FERRITIN, TIBC, IRON, RETICCTPCT in the last 72 hours. Urine analysis:    Component Value Date/Time   COLORURINE AMBER (A) 07/17/2019 1647   APPEARANCEUR CLOUDY (A) 07/17/2019 1647   APPEARANCEUR Clear 07/13/2014 0933   LABSPEC 1.013 07/17/2019 1647   LABSPEC 1.005 07/13/2014 0933   PHURINE 6.0 07/17/2019 1647   GLUCOSEU NEGATIVE 07/17/2019 1647   GLUCOSEU Negative 07/13/2014 0933   HGBUR NEGATIVE 07/17/2019 1647   BILIRUBINUR NEGATIVE 07/17/2019 1647   BILIRUBINUR Negative 07/13/2014 0933   KETONESUR NEGATIVE 07/17/2019 1647   PROTEINUR 30 (A) 07/17/2019 1647   UROBILINOGEN 0.2 10/01/2010 1133   NITRITE NEGATIVE 07/17/2019 1647   LEUKOCYTESUR MODERATE (A) 07/17/2019 1647   LEUKOCYTESUR Trace 07/13/2014 0933   Sepsis Labs: (procalcitonin:4,lacticidven:4) ) Recent Results (from the past 240 hour(s))  SARS Coronavirus 2 by RT PCR (hospital order, performed in [facility] hospital lab) Nasopharyngeal  Nasopharyngeal Swab     Status: None   Collection Time: 03/08/20  4:47 PM   Specimen: Nasopharyngeal Swab  Result Value Ref Range Status   SARS Coronavirus 2 NEGATIVE NEGATIVE Final    Comment: (NOTE) SARS-CoV-2 target nucleic acids are  NOT DETECTED.  The SARS-CoV-2 RNA is generally detectable in upper and lower respiratory specimens during the acute phase of infection. The lowest concentration of SARS-CoV-2 viral copies this assay can detect is 250 copies / mL. A negative result does not preclude SARS-CoV-2 infection and should not be used as the sole basis for treatment or other patient management decisions.  A negative result may occur with improper specimen collection / handling, submission of specimen other than nasopharyngeal swab, presence of viral mutation(s) within the areas targeted by this assay, and inadequate number of viral copies (<250 copies / mL). A negative result must be combined with clinical observations, patient history, and epidemiological information.  Fact Sheet for Patients:   BoilerBrush.com.cyhttps://www.fda.gov/media/136312/download  Fact Sheet for Healthcare Providers: https://pope.com/https://www.fda.gov/media/136313/download  This test is not yet approved or  cleared by the [place] FDA and has been authorized for detection and/or diagnosis of SARS-CoV-2 by FDA under an Emergency Use Authorization (EUA).  This EUA will remain in effect (meaning this test can be used) for the duration of the COVID-19 declaration under Section 564(b)(1) of the Act, 21 U.S.C. section 360bbb-3(b)(1), unless the authorization is terminated or revoked sooner.  Performed at [facility], [address] [place], [place] [address]      Radiological Exams on Admission: DG Chest 1 View  Result Date: 03/08/2020 CLINICAL DATA:  Preop EXAM: CHEST  1 VIEW COMPARISON:  January 13, 2019 FINDINGS: There is an old right clavicle fracture. The patient is status post prior left total shoulder  arthroplasty. There appears to be stable lucency about the left shoulder prosthesis. There is stable pleural thickening along the mid left chest wall. Stable coarse airspace opacities are noted throughout both lung fields most evident in the right upper lobe. There are old healed left-sided rib fractures. There is no pneumothorax. The heart size is stable. Aortic calcifications are noted. IMPRESSION: No active disease. Electronically Signed   By: Katherine Mantlehristopher  Green M.D.   On: 03/08/2020 16:20   CT Head Wo Contrast  Result Date: 03/08/2020 CLINICAL DATA:  Pain after fall. EXAM: CT HEAD WITHOUT CONTRAST TECHNIQUE: Contiguous axial images were obtained from the base of the skull through the vertex without intravenous contrast. COMPARISON:  Most recent head CT 01/15/2017 FINDINGS: Brain: Generalized atrophy is similar to prior exam, advanced for age. Mild periventricular chronic small vessel ischemia. No intracranial hemorrhage, mass effect, or midline shift. No hydrocephalus. The basilar cisterns are patent. No evidence of territorial infarct or acute ischemia. No extra-axial or intracranial fluid collection. Vascular: No hyperdense vessel. Skull: No fracture or focal lesion. Sinuses/Orbits: No acute findings. Chronic opacification of lower lateral left mastoid air cells. Remote medial left orbital wall fracture. Remote nasal bone fracture with rightward nasal septal bowing. Other: None. IMPRESSION: 1. No acute intracranial abnormality. No skull fracture. 2. Unchanged atrophy, age advanced. Electronically Signed   By: Narda RutherfordMelanie  Sanford M.D.   On: 03/08/2020 16:32   CT Cervical Spine Wo Contrast  Result Date: 03/08/2020 CLINICAL DATA:  Pain after fall. EXAM: CT CERVICAL SPINE WITHOUT CONTRAST TECHNIQUE: Multidetector CT imaging of the cervical spine was performed without intravenous contrast. Multiplanar CT image reconstructions were also generated. COMPARISON:  None. FINDINGS: Alignment: No evidence of traumatic  malalignment. 3 mm anterolisthesis of C4 on C5, 2 mm retrolisthesis of C5 on C6 appears degenerative. Mild straightening of normal lordosis. Skull base and vertebrae: No acute fracture. Modic endplate changes at C5-C6. The dens and skull base are intact. Soft tissues and spinal  canal: No prevertebral fluid or swelling. No visible canal hematoma. Disc levels: Diffuse degenerative disc disease, most prominent at C5-C6 where there is near complete disc space loss suggestion Modic endplate changes. Multilevel facet hypertrophy, prominent at C3-C4 on the right and CT C3 on the left. Multilevel neural foraminal stenosis. Upper chest: Stable appearance of cavitary focus in the right lung apex from 02/01/2019 chest CT. No acute findings. Other: None. IMPRESSION: 1. No acute fracture or subluxation of the cervical spine. 2. Multilevel degenerative disc disease and facet hypertrophy. Electronically Signed   By: Narda Rutherford M.D.   On: 03/08/2020 16:39   DG Hip Unilat W or Wo Pelvis 2-3 Views Left  Result Date: 03/08/2020 CLINICAL DATA:  Larey Seat, left hip pain EXAM: DG HIP (WITH OR WITHOUT PELVIS) 2-3V LEFT COMPARISON:  None. FINDINGS: Frontal view of the pelvis as well as frontal and cross-table lateral views of the left hip are obtained. There is a comminuted intertrochanteric left hip fracture with varus angulation and impaction at the fracture site. No dislocation. Chronic postsurgical changes are seen within the right hip and femur. The remainder of the bony pelvis is unremarkable. Moderate spondylosis and facet hypertrophy at the lumbosacral junction. IMPRESSION: 1. Comminuted impacted intertrochanteric left hip fracture. Electronically Signed   By: Sharlet Salina M.D.   On: 03/08/2020 16:16    EKG: Independently reviewed.  Sinus tachycardia no significant ST changes  Assessment/Plan Principal Problem:   Displaced intertrochanteric fracture of left femur, initial encounter for closed fracture [facility]) Active  Problems:   Alcohol abuse   Asthma   Bipolar 1 disorder (HCC)   COPD (chronic obstructive pulmonary disease) (HCC)     #1 left intertrochanteric fracture: Patient is going to be admitted and initiated on pain management and supportive care.  He will be seen by orthopedics and evaluated.  We will continue with some pain management and supportive care.  #2 alcohol abuse: Patient is intoxicated the moment.  Will initiate CIWA protocol and monitor.  Counseling after he recovers.  #3 bipolar disorder: Confirm and resume home regimen.  #4 history of asthma: Initiate and continue home regimen.  #5 COPD: No acute exacerbation.   DVT prophylaxis: SCD Code Status: Full code Family Communication: No family at bedside Disposition Plan: To be determined Consults called: Orthopedic surgery Dr. Martha Clan Admission status: Inpatient   Severity of Illness: The appropriate patient status for this patient is INPATIENT. Inpatient status is judged to be reasonable and necessary in order to provide the required intensity of service to ensure the patient's safety. The patient's presenting symptoms, physical exam findings, and initial radiographic and laboratory data in the context of their chronic comorbidities is felt to place them at high risk for further clinical deterioration. Furthermore, it is not anticipated that the patient will be medically stable for discharge from the hospital within 2 midnights of admission. The following factors support the patient status of inpatient.   " The patient's presenting symptoms include Fall and left hip pain. " The worrisome physical exam findings include Pain and tenderness with confusion. " The initial radiographic and laboratory data are worrisome because of Fracture of left hip. " The chronic co-morbidities include Alcohol abuse.   * I certify that at the point of admission it is my clinical judgment that the patient will require inpatient hospital care  spanning beyond 2 midnights from the point of admission due to high intensity of service, high risk for further deterioration and high frequency of surveillance required.*  Lonia Blood MD [facility] Pager [phone]  If 7PM-7AM, please contact night-coverage [email or web address] Password [email or web address]  03/09/2020, 1:05 AM

## 2020-03-08 NOTE — ED Triage Notes (Signed)
Pt states he was walking to bathroom and fell hitting right hip. 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 ) Pt states he has had some alcohol today, obvious alcohol smell to breath. Pt alert and oriented x 4 . Pt states he does not remember LOC or if he hit his head. Pt non ambulatory when EMS arrived.

## 2020-03-08 NOTE — ED Notes (Signed)
Iv insertion attempted x 2 with no success, IV team consult placed as pt is difficult stick

## 2020-03-09 ENCOUNTER — Encounter
Admission: EM | Disposition: A | Discharge status: Home / Self Care | Payer: Self-pay | Source: Home / Self Care | Attending: Internal Medicine

## 2020-03-09 ENCOUNTER — Inpatient Hospital Stay: Payer: Medicaid Other | Admitting: Anesthesiology

## 2020-03-09 ENCOUNTER — Encounter: Payer: Self-pay | Admitting: Internal Medicine

## 2020-03-09 ENCOUNTER — Inpatient Hospital Stay: Payer: Medicaid Other

## 2020-03-09 DIAGNOSIS — F1010 Alcohol abuse, uncomplicated: Secondary | ICD-10-CM

## 2020-03-09 DIAGNOSIS — J431 Panlobular emphysema: Secondary | ICD-10-CM

## 2020-03-09 HISTORY — PX: INTRAMEDULLARY (IM) NAIL INTERTROCHANTERIC: SHX5875

## 2020-03-09 LAB — COMPREHENSIVE METABOLIC PANEL
ALT: 28 U/L (ref 0–44)
AST: 65 U/L — ABNORMAL HIGH (ref 15–41)
Albumin: 3.2 g/dL — ABNORMAL LOW (ref 3.5–5.0)
Alkaline Phosphatase: 113 U/L (ref 38–126)
Anion gap: 9 (ref 5–15)
BUN: 7 mg/dL (ref 6–20)
CO2: 23 mmol/L (ref 22–32)
Calcium: 8.2 mg/dL — ABNORMAL LOW (ref 8.9–10.3)
Chloride: 108 mmol/L (ref 98–111)
Creatinine, Ser: 0.61 mg/dL (ref 0.61–1.24)
GFR calc Af Amer: >60 mL/min (ref >60)
GFR calc non Af Amer: >60 mL/min (ref >60)
Glucose, Bld: 123 mg/dL — ABNORMAL HIGH (ref 70–99)
Potassium: 4.2 mmol/L (ref 3.5–5.1)
Sodium: 140 mmol/L (ref 135–145)
Total Bilirubin: 1.6 mg/dL — ABNORMAL HIGH (ref 0.3–1.2)
Total Protein: 6.1 g/dL — ABNORMAL LOW (ref 6.5–8.1)

## 2020-03-09 LAB — GLUCOSE, CAPILLARY: Glucose-Capillary: 138 mg/dL — ABNORMAL HIGH (ref 70–99)

## 2020-03-09 LAB — URINE DRUG SCREEN, QUALITATIVE (ARMC ONLY)
Amphetamines, Ur Screen: NOT DETECTED
Barbiturates, Ur Screen: NOT DETECTED
Benzodiazepine, Ur Scrn: NOT DETECTED
Cannabinoid 50 Ng, Ur ~~LOC~~: NOT DETECTED
Cocaine Metabolite,Ur ~~LOC~~: NOT DETECTED
MDMA (Ecstasy)Ur Screen: NOT DETECTED
Methadone Scn, Ur: NOT DETECTED
Opiate, Ur Screen: POSITIVE — AB
Phencyclidine (PCP) Ur S: NOT DETECTED
Tricyclic, Ur Screen: POSITIVE — AB

## 2020-03-09 LAB — CBC
HCT: 36.3 % — ABNORMAL LOW (ref 39.0–52.0)
Hemoglobin: 12.4 g/dL — ABNORMAL LOW (ref 13.0–17.0)
MCH: 34.8 pg — ABNORMAL HIGH (ref 26.0–34.0)
MCHC: 34.2 g/dL (ref 30.0–36.0)
MCV: 102 fL — ABNORMAL HIGH (ref 80.0–100.0)
Platelets: 157 10*3/uL (ref 150–400)
RBC: 3.56 MIL/uL — ABNORMAL LOW (ref 4.22–5.81)
RDW: 12.2 % (ref 11.5–15.5)
WBC: 9 10*3/uL (ref 4.0–10.5)
nRBC: 0 % (ref 0.0–0.2)

## 2020-03-09 LAB — HIV ANTIBODY (ROUTINE TESTING W REFLEX): HIV Screen 4th Generation wRfx: NONREACTIVE

## 2020-03-09 SURGERY — FIXATION, FRACTURE, INTERTROCHANTERIC, WITH INTRAMEDULLARY ROD
Anesthesia: Spinal | Laterality: Left

## 2020-03-09 MED ORDER — SODIUM CHLORIDE 0.9 % IV BOLUS
1000.0000 mL | Freq: Once | INTRAVENOUS | Status: AC
  Administered 2020-03-09: 1000 mL via INTRAVENOUS

## 2020-03-09 MED ORDER — MIDAZOLAM HCL 2 MG/2ML IJ SOLN
INTRAMUSCULAR | Status: AC
  Filled 2020-03-09: qty 2

## 2020-03-09 MED ORDER — QUETIAPINE FUMARATE 25 MG PO TABS
200.0000 mg | ORAL_TABLET | Freq: Every day | ORAL | Status: DC
  Administered 2020-03-09: 200 mg via ORAL
  Filled 2020-03-09 (×2): qty 8

## 2020-03-09 MED ORDER — ALLOPURINOL 100 MG PO TABS
100.0000 mg | ORAL_TABLET | Freq: Every day | ORAL | Status: DC
  Administered 2020-03-10 – 2020-03-15 (×6): 100 mg via ORAL
  Filled 2020-03-09 (×8): qty 1

## 2020-03-09 MED ORDER — OXYCODONE HCL 5 MG PO TABS: 10.0000 mg | ORAL_TABLET | ORAL | Status: DC | PRN

## 2020-03-09 MED ORDER — TRAMADOL HCL 50 MG PO TABS
50.0000 mg | ORAL_TABLET | Freq: Four times a day (QID) | ORAL | Status: DC
  Administered 2020-03-10 – 2020-03-15 (×19): 50 mg via ORAL
  Filled 2020-03-09 (×21): qty 1

## 2020-03-09 MED ORDER — CHLORDIAZEPOXIDE HCL 5 MG PO CAPS
25.0000 mg | ORAL_CAPSULE | Freq: Three times a day (TID) | ORAL | Status: DC
  Administered 2020-03-10 – 2020-03-11 (×4): 25 mg via ORAL
  Filled 2020-03-09 (×4): qty 5

## 2020-03-09 MED ORDER — OXYCODONE HCL 5 MG PO TABS: 5.0000 mg | ORAL_TABLET | ORAL | Status: DC | PRN

## 2020-03-09 MED ORDER — LACTATED RINGERS IV SOLN: INTRAVENOUS | Status: DC | PRN

## 2020-03-09 MED ORDER — CHLORHEXIDINE GLUCONATE CLOTH 2 % EX PADS
6.0000 | MEDICATED_PAD | Freq: Every day | CUTANEOUS | Status: DC
  Administered 2020-03-09 – 2020-03-15 (×4): 6 via TOPICAL

## 2020-03-09 MED ORDER — METOPROLOL TARTRATE 5 MG/5ML IV SOLN
5.0000 mg | Freq: Once | INTRAVENOUS | Status: AC
  Administered 2020-03-09: 5 mg via INTRAVENOUS
  Filled 2020-03-09: qty 5

## 2020-03-09 MED ORDER — CEFAZOLIN SODIUM-DEXTROSE 2-4 GM/100ML-% IV SOLN
INTRAVENOUS | Status: AC
  Filled 2020-03-09: qty 100

## 2020-03-09 MED ORDER — MIDAZOLAM HCL 5 MG/5ML IJ SOLN
INTRAMUSCULAR | Status: DC | PRN
  Administered 2020-03-09: 2 mg via INTRAVENOUS

## 2020-03-09 MED ORDER — FENTANYL CITRATE (PF) 100 MCG/2ML IJ SOLN
INTRAMUSCULAR | Status: AC
  Filled 2020-03-09: qty 2

## 2020-03-09 MED ORDER — LORATADINE 10 MG PO TABS
10.0000 mg | ORAL_TABLET | Freq: Every day | ORAL | Status: DC
  Administered 2020-03-10 – 2020-03-15 (×6): 10 mg via ORAL
  Filled 2020-03-09 (×6): qty 1

## 2020-03-09 MED ORDER — PROPOFOL 10 MG/ML IV BOLUS
INTRAVENOUS | Status: DC | PRN
  Administered 2020-03-09: 30 mg via INTRAVENOUS
  Administered 2020-03-09: 20 mg via INTRAVENOUS

## 2020-03-09 MED ORDER — ENOXAPARIN SODIUM 40 MG/0.4ML ~~LOC~~ SOLN
40.0000 mg | SUBCUTANEOUS | Status: DC
  Administered 2020-03-10: 40 mg via SUBCUTANEOUS
  Filled 2020-03-09: qty 0.4

## 2020-03-09 MED ORDER — OXYCODONE HCL 5 MG/5ML PO SOLN: 5.0000 mg | Freq: Once | ORAL | Status: DC | PRN

## 2020-03-09 MED ORDER — BUPIVACAINE HCL (PF) 0.5 % IJ SOLN
INTRAMUSCULAR | Status: DC | PRN
  Administered 2020-03-09: 2.5 mL

## 2020-03-09 MED ORDER — PROPOFOL 500 MG/50ML IV EMUL
INTRAVENOUS | Status: DC | PRN
  Administered 2020-03-09: 100 ug/kg/min via INTRAVENOUS

## 2020-03-09 MED ORDER — HYDROMORPHONE HCL 1 MG/ML IJ SOLN: 0.5000 mg | INTRAMUSCULAR | Status: DC | PRN

## 2020-03-09 MED ORDER — KETOROLAC TROMETHAMINE 15 MG/ML IJ SOLN
7.5000 mg | Freq: Four times a day (QID) | INTRAMUSCULAR | Status: AC
  Administered 2020-03-09 – 2020-03-10 (×4): 7.5 mg via INTRAVENOUS
  Filled 2020-03-09 (×4): qty 1

## 2020-03-09 MED ORDER — FLUTICASONE PROPIONATE 50 MCG/ACT NA SUSP
2.0000 | Freq: Every day | NASAL | Status: DC
  Administered 2020-03-10 – 2020-03-14 (×5): 2 via NASAL
  Filled 2020-03-09: qty 16

## 2020-03-09 MED ORDER — MAGNESIUM CITRATE PO SOLN
1.0000 | Freq: Once | ORAL | Status: DC | PRN
  Filled 2020-03-09: qty 296

## 2020-03-09 MED ORDER — ACETAMINOPHEN 500 MG PO TABS
1000.0000 mg | ORAL_TABLET | Freq: Four times a day (QID) | ORAL | Status: DC
  Administered 2020-03-09 – 2020-03-10 (×3): 1000 mg via ORAL
  Filled 2020-03-09 (×3): qty 2

## 2020-03-09 MED ORDER — MOMETASONE FURO-FORMOTEROL FUM 200-5 MCG/ACT IN AERO
2.0000 | INHALATION_SPRAY | Freq: Two times a day (BID) | RESPIRATORY_TRACT | Status: DC
  Administered 2020-03-09 – 2020-03-15 (×11): 2 via RESPIRATORY_TRACT
  Filled 2020-03-09: qty 8.8

## 2020-03-09 MED ORDER — ONDANSETRON HCL 4 MG/2ML IJ SOLN: 4.0000 mg | Freq: Four times a day (QID) | INTRAMUSCULAR | Status: DC | PRN

## 2020-03-09 MED ORDER — TRAZODONE HCL 100 MG PO TABS
100.0000 mg | ORAL_TABLET | Freq: Every day | ORAL | Status: DC
  Administered 2020-03-09: 100 mg via ORAL
  Filled 2020-03-09: qty 1

## 2020-03-09 MED ORDER — PANTOPRAZOLE SODIUM 40 MG PO TBEC
40.0000 mg | DELAYED_RELEASE_TABLET | Freq: Every day | ORAL | Status: DC
  Administered 2020-03-10 – 2020-03-15 (×6): 40 mg via ORAL
  Filled 2020-03-09 (×6): qty 1

## 2020-03-09 MED ORDER — PROPOFOL 500 MG/50ML IV EMUL
INTRAVENOUS | Status: AC
  Filled 2020-03-09: qty 50

## 2020-03-09 MED ORDER — POLYETHYLENE GLYCOL 3350 17 G PO PACK
17.0000 g | PACK | Freq: Every day | ORAL | Status: DC | PRN
  Administered 2020-03-11: 17 g via ORAL
  Filled 2020-03-09: qty 1

## 2020-03-09 MED ORDER — ALBUTEROL SULFATE (2.5 MG/3ML) 0.083% IN NEBU: 3.0000 mL | INHALATION_SOLUTION | Freq: Four times a day (QID) | RESPIRATORY_TRACT | Status: DC | PRN

## 2020-03-09 MED ORDER — SENNA 8.6 MG PO TABS
1.0000 | ORAL_TABLET | Freq: Two times a day (BID) | ORAL | Status: DC
  Administered 2020-03-09 – 2020-03-15 (×12): 8.6 mg via ORAL
  Filled 2020-03-09 (×12): qty 1

## 2020-03-09 MED ORDER — FENTANYL CITRATE (PF) 100 MCG/2ML IJ SOLN: 25.0000 ug | INTRAMUSCULAR | Status: DC | PRN

## 2020-03-09 MED ORDER — FENTANYL CITRATE (PF) 100 MCG/2ML IJ SOLN
INTRAMUSCULAR | Status: DC | PRN
Start: 2020-03-09 — End: 2020-03-09
  Administered 2020-03-09 (×2): 50 ug via INTRAVENOUS

## 2020-03-09 MED ORDER — MAGNESIUM SULFATE 2 GM/50ML IV SOLN
2.0000 g | Freq: Once | INTRAVENOUS | Status: AC
  Administered 2020-03-09: 2 g via INTRAVENOUS
  Filled 2020-03-09: qty 50

## 2020-03-09 MED ORDER — SODIUM CHLORIDE 0.9 % IV BOLUS: 500.0000 mL | Freq: Once | INTRAVENOUS | Status: DC

## 2020-03-09 MED ORDER — ALBUTEROL SULFATE (2.5 MG/3ML) 0.083% IN NEBU: 2.5000 mg | INHALATION_SOLUTION | Freq: Four times a day (QID) | RESPIRATORY_TRACT | Status: DC | PRN

## 2020-03-09 MED ORDER — ALUM & MAG HYDROXIDE-SIMETH 200-200-20 MG/5ML PO SUSP
30.0000 mL | ORAL | Status: DC | PRN
  Administered 2020-03-10: 30 mL via ORAL
  Filled 2020-03-09: qty 30

## 2020-03-09 MED ORDER — BISACODYL 10 MG RE SUPP: 10.0000 mg | Freq: Every day | RECTAL | Status: DC | PRN

## 2020-03-09 MED ORDER — ONDANSETRON HCL 4 MG PO TABS: 4.0000 mg | ORAL_TABLET | Freq: Four times a day (QID) | ORAL | Status: DC | PRN

## 2020-03-09 MED ORDER — ONDANSETRON HCL 4 MG/2ML IJ SOLN: 4.0000 mg | Freq: Once | INTRAMUSCULAR | Status: DC | PRN

## 2020-03-09 MED ORDER — TIOTROPIUM BROMIDE MONOHYDRATE 18 MCG IN CAPS
18.0000 ug | ORAL_CAPSULE | Freq: Every day | RESPIRATORY_TRACT | Status: DC
  Administered 2020-03-10 – 2020-03-15 (×6): 18 ug via RESPIRATORY_TRACT
  Filled 2020-03-09 (×2): qty 5

## 2020-03-09 MED ORDER — CEFAZOLIN SODIUM-DEXTROSE 1-4 GM/50ML-% IV SOLN
1.0000 g | Freq: Four times a day (QID) | INTRAVENOUS | Status: AC
  Administered 2020-03-09 – 2020-03-10 (×2): 1 g via INTRAVENOUS
  Filled 2020-03-09 (×2): qty 50

## 2020-03-09 MED ORDER — SODIUM CHLORIDE 0.9 % IV SOLN
INTRAVENOUS | Status: DC | PRN
  Administered 2020-03-09: 50 ug/min via INTRAVENOUS

## 2020-03-09 MED ORDER — GABAPENTIN 600 MG PO TABS
600.0000 mg | ORAL_TABLET | Freq: Three times a day (TID) | ORAL | Status: DC
  Administered 2020-03-09 – 2020-03-15 (×17): 600 mg via ORAL
  Filled 2020-03-09 (×19): qty 1

## 2020-03-09 MED ORDER — ONDANSETRON HCL 4 MG/2ML IJ SOLN
INTRAMUSCULAR | Status: DC | PRN
  Administered 2020-03-09: 4 mg via INTRAVENOUS

## 2020-03-09 MED ORDER — DOCUSATE SODIUM 100 MG PO CAPS
100.0000 mg | ORAL_CAPSULE | Freq: Two times a day (BID) | ORAL | Status: DC
  Administered 2020-03-09 – 2020-03-15 (×12): 100 mg via ORAL
  Filled 2020-03-09 (×12): qty 1

## 2020-03-09 MED ORDER — OXYCODONE HCL 5 MG PO TABS: 5.0000 mg | ORAL_TABLET | Freq: Once | ORAL | Status: DC | PRN

## 2020-03-09 MED ORDER — SODIUM CHLORIDE 0.9 % IR SOLN
Status: DC | PRN
  Administered 2020-03-09: 2 mL

## 2020-03-09 SURGICAL SUPPLY — 44 items
BIT DRILL 4.3MMS DISTAL GRDTED (BIT) ×1 IMPLANT
BNDG COHESIVE 6X5 TAN STRL LF (GAUZE/BANDAGES/DRESSINGS) ×6 IMPLANT
CANISTER SUCT 1200ML W/VALVE (MISCELLANEOUS) ×3 IMPLANT
CATH COUDE FOLEY 5CC 14FR (CATHETERS) ×3 IMPLANT
COVER WAND RF STERILE (DRAPES) ×3 IMPLANT
DRAPE 3/4 80X56 (DRAPES) ×6 IMPLANT
DRAPE SURG 17X11 SM STRL (DRAPES) ×6 IMPLANT
DRAPE U-SHAPE 47X51 STRL (DRAPES) ×3 IMPLANT
DRILL 4.3MMS DISTAL GRADUATED (BIT) ×3
DRSG OPSITE POSTOP 3X4 (GAUZE/BANDAGES/DRESSINGS) ×6 IMPLANT
DRSG OPSITE POSTOP 4X14 (GAUZE/BANDAGES/DRESSINGS) IMPLANT
DRSG OPSITE POSTOP 4X6 (GAUZE/BANDAGES/DRESSINGS) ×3 IMPLANT
DURAPREP 26ML APPLICATOR (WOUND CARE) ×6 IMPLANT
ELECT REM PT RETURN 9FT ADLT (ELECTROSURGICAL) ×3
ELECTRODE REM PT RTRN 9FT ADLT (ELECTROSURGICAL) ×1 IMPLANT
GLOVE BIOGEL PI IND STRL 9 (GLOVE) ×1 IMPLANT
GLOVE BIOGEL PI INDICATOR 9 (GLOVE) ×2
GLOVE SURG 9.0 ORTHO LTXF (GLOVE) ×6 IMPLANT
GOWN STRL REUS TWL 2XL XL LVL4 (GOWN DISPOSABLE) ×3 IMPLANT
GOWN STRL REUS W/ TWL LRG LVL3 (GOWN DISPOSABLE) ×1 IMPLANT
GOWN STRL REUS W/TWL LRG LVL3 (GOWN DISPOSABLE) ×2
GUIDEPIN VERSANAIL DSP 3.2X444 (ORTHOPEDIC DISPOSABLE SUPPLIES) ×3 IMPLANT
GUIDEWIRE BALL NOSE 100CM (WIRE) ×3 IMPLANT
HEMOVAC 400CC 10FR (MISCELLANEOUS) IMPLANT
HFN LH 130 DEG 11MM X 380MM (Orthopedic Implant) ×3 IMPLANT
HOLDER FOLEY CATH W/STRAP (MISCELLANEOUS) ×3 IMPLANT
KIT TURNOVER CYSTO (KITS) ×3 IMPLANT
MAT ABSORB  FLUID 56X50 GRAY (MISCELLANEOUS) ×2
MAT ABSORB FLUID 56X50 GRAY (MISCELLANEOUS) ×1 IMPLANT
NS IRRIG 1000ML POUR BTL (IV SOLUTION) ×3 IMPLANT
PACK HIP COMPR (MISCELLANEOUS) ×3 IMPLANT
PAD ARMBOARD 7.5X6 YLW CONV (MISCELLANEOUS) ×3 IMPLANT
SCREW BONE CORTICAL 5.0X50 (Screw) ×3 IMPLANT
SCREW BONE CORTICAL 5.0X54 (Screw) ×3 IMPLANT
SCREW LAG 10.5MMX105MM HFN (Screw) ×3 IMPLANT
STAPLER SKIN PROX 35W (STAPLE) ×3 IMPLANT
SUCTION FRAZIER HANDLE 10FR (MISCELLANEOUS) ×2
SUCTION TUBE FRAZIER 10FR DISP (MISCELLANEOUS) ×1 IMPLANT
SUT VIC AB 0 CT1 36 (SUTURE) ×6 IMPLANT
SUT VIC AB 2-0 CT1 27 (SUTURE) ×2
SUT VIC AB 2-0 CT1 TAPERPNT 27 (SUTURE) ×1 IMPLANT
SUT VICRYL 0 AB UR-6 (SUTURE) ×3 IMPLANT
SYR 30ML LL (SYRINGE) ×3 IMPLANT
TRAY FOLEY MTR SLVR 16FR STAT (SET/KITS/TRAYS/PACK) ×3 IMPLANT

## 2020-03-09 NOTE — Anesthesia Preprocedure Evaluation (Addendum)
Anesthesia Evaluation  Patient identified by MRN, date of birth, ID band Patient awake    Reviewed: Allergy & Precautions, H&P , NPO status , Patient's Chart, lab work & pertinent test results  History of Anesthesia Complications Negative for: history of anesthetic complications  Airway Mallampati: II  TM Distance: >3 FB Neck ROM: Full    Dental  (+) Edentulous Upper, Edentulous Lower, Dental Advisory Given   Pulmonary shortness of breath, asthma , neg sleep apnea, COPD,  COPD inhaler, Current Smoker,    Pulmonary exam normal breath sounds clear to auscultation       Cardiovascular (-) angina+ Peripheral Vascular Disease  (-) Past MI and (-) Cardiac Stents (-) dysrhythmias  Rhythm:Regular Rate:Normal - Systolic murmurs    Neuro/Psych  Headaches, Seizures -,  PSYCHIATRIC DISORDERS Anxiety Depression Bipolar Disorder Chronic back pain Claims never had withdrawal / DTs from alcohol    GI/Hepatic GERD  ,(+)     substance abuse  alcohol use, Hepatitis -, C  Endo/Other  negative endocrine ROS  Renal/GU      Musculoskeletal   Abdominal   Peds  Hematology negative hematology ROS (+)   Anesthesia Other Findings Past Medical History: No date: Anxiety No date: Arthritis No date: Asthma No date: Bipolar 1 disorder (HCC) No date: Blind right eye No date: Broken hip (HCC) No date: Chronic back pain     Comment:  Diverticulosis No date: Chronic headaches No date: Convulsion (HCC)     Comment:  once a month when stands up too quickly No date: COPD (chronic obstructive pulmonary disease) (HCC) No date: DDD (degenerative disc disease), lumbar No date: Diverticulitis No date: GERD (gastroesophageal reflux disease) No date: Glaucoma No date: Hepatitis     Comment:  hepatitis C No date: Pneumonia No date: PVD (peripheral vascular disease) (HCC)     Comment:  lower extremities reddened and feet swollen No date: Seasonal  allergies No date: Shortness of breath dyspnea  Past Surgical History: No date: EYE SURGERY; Right 01/16/2017: INTRAMEDULLARY (IM) NAIL INTERTROCHANTERIC; Right     Comment:  Procedure: INTRAMEDULLARY (IM) NAIL INTERTROCHANTRIC;                Surgeon: Christena Flake, MD;  Location: [facility] ORS;                Service: Orthopedics;  Laterality: Right; 04/22/2015: INTUBATION-ENDOTRACHEAL WITH TRACHEOSTOMY STANDBY; N/A     Comment:  Procedure: INTUBATION-ENDOTRACHEAL WITH TRACHEOSTOMY               STANDBY;  Surgeon: Linus Salmons, MD;  Location: [facility]               ORS;  Service: ENT;  Laterality: N/A; No date: JOINT REPLACEMENT; Left     Comment:  Lt shoulder No date: MANDIBLE SURGERY  BMI    Body Mass Index: 23.71 kg/m      Reproductive/Obstetrics negative OB ROS                            Anesthesia Physical Anesthesia Plan  ASA: III  Anesthesia Plan: Spinal   Post-op Pain Management:    Induction: Intravenous  PONV Risk Score and Plan: 3 and Propofol infusion, Ondansetron, Dexamethasone, TIVA and Midazolam  Airway Management Planned: Natural Airway  Additional Equipment: None  Intra-op Plan:   Post-operative Plan:   Informed Consent: I have reviewed the patients History and Physical, chart, labs and discussed the procedure including  the risks, benefits and alternatives for the proposed anesthesia with the patient or authorized representative who has indicated his/her understanding and acceptance.     Dental Advisory Given  Plan Discussed with: Anesthesiologist, CRNA and Surgeon  Anesthesia Plan Comments: (Denies blood thinner use Discussed R/B/A of neuraxial anesthesia technique with patient: - rare risks of spinal/epidural hematoma, nerve damage, infection - Risk of PDPH - Risk of nausea and vomiting - Risk of conversion to general anesthesia and its associated risks, including sore throat, damage to lips/teeth/oropharynx, and rare  risks such as cardiac and respiratory events.  Patient voiced understanding.)       Anesthesia Quick Evaluation

## 2020-03-09 NOTE — Progress Notes (Signed)
Patients Informed Consent signed and witnessed by this writer , placed in medical chart

## 2020-03-09 NOTE — Progress Notes (Signed)
   03/09/20 0621  Assess: MEWS Score  Temp 100.1 F (37.8 C)  BP (!) 164/110  Pulse Rate (!) 124  Resp 15  SpO2 100 %  Assess: MEWS Score  MEWS Temp 0  MEWS Systolic 0  MEWS Pulse 2  MEWS RR 0  MEWS LOC 0  MEWS Score 2  MEWS Score Color Yellow  Document  Patient Outcome Other (Comment) (patient remains stable with Tachycardia, metoprolol ordered)  Progress note created (see row info) Yes

## 2020-03-09 NOTE — Anesthesia Postprocedure Evaluation (Signed)
Anesthesia Post Note  Patient: Perry Bullock  Procedure(s) Performed: INTRAMEDULLARY (IM) NAIL INTERTROCHANTRIC (Left )  Patient location during evaluation: PACU Anesthesia Type: Spinal Level of consciousness: oriented and awake and alert Pain management: pain level controlled Vital Signs Assessment: post-procedure vital signs reviewed and stable Respiratory status: spontaneous breathing, respiratory function stable and patient connected to nasal cannula oxygen Cardiovascular status: blood pressure returned to baseline and stable Postop Assessment: no headache, no backache and no apparent nausea or vomiting Anesthetic complications: no   No complications documented.   Last Vitals:  Vitals:   03/09/20 1756 03/09/20 1839  BP: 125/84   Pulse: 95   Resp: (!) 22   Temp: 36.6 C   SpO2: 100% 95%    Last Pain:  Vitals:   03/09/20 1839  TempSrc:   PainSc: 0-No pain                 Corinda Gubler

## 2020-03-09 NOTE — Op Note (Signed)
03/09/2020  6:05 PM  PATIENT:  Perry Bullock    PRE-OPERATIVE DIAGNOSIS:  Displaced left intertrochanteric hip fracture   POST-OPERATIVE DIAGNOSIS:  Same  PROCEDURE:  INTRAMEDULLARY FIXATION OF LEFT  INTERTROCHANTERIC HIP FRACTURE WITH LONG IM NAIL  SURGEON:  Juanell Fairly, MD  ANESTHESIA:   Spinal   EBL:  minimal  IMPLANT:  ZIMMER BIOMET AFFIXUS NAIL 41mm x with a lag screw and distal interlocking screws 102mm  and 50 mm in length.  PREOPERATIVE INDICATIONS:  Perry Bullock is a  59 y.o. male with a diagnosis of Displaced left intertrochanteric hip fracture status post fall.  Given the displacement of the fracture it was recommended the patient have intramedullary fixation for remainder of his injury.    The risks, benefits and alternatives were discussed with the patient and their family.  The risks include but are not limited to infection, bleeding requiring blood transfusion, nerve or blood vessel injury, malunion, nonunion, hardware prominence, hardware failure, leg length discrepancy or change in lower extremity rotation and need for further surgery including hardware removal with conversion to a total hip arthroplasty. Medical risks include but are not limited to DVT and pulmonary embolism, myocardial infarction, stroke, pneumonia, respiratory failure and death. The patient and their understood these risks and wished to proceed with surgery.  OPERATIVE PROCEDURE:  The patient was brought to the operating room and placed in the supine position on the fracture table. The patient received spinal anesthesia.  A closed reduction was performed under C-arm guidance.  The fracture reduction was confirmed on both AP and lateral views. After adequate reduction was achieved, a time out was performed to verify the patient's name, date of birth, medical record number, correct site of surgery correct procedure to be performed. The timeout was also used to verify the patient  received antibiotics and all appropriate instruments, implants and radiographic studies were available in the room. Once all in attendance were in agreement, the case began. The patient was prepped and draped in a sterile fashion. He received preoperative antibiotics with ancef 2 grams IV.  An incision was made proximal to the greater trochanter in line with the femur. A guidewire was placed over the tip of the greater trochanter and advanced by drill into the proximal femur to the level of the lesser trochanter.  Confirmation of the drill pin position was made on AP and lateral C-arm images.  The threaded guidepin was then overdrilled with the proximal femoral entry reamer.  A ball-tipped guidewire was then advanced down the intramedullary canal, across the fracture, and down the femoral shaft to the knee.  The ball tip guidewire's position was confirmed at both the knee and hip via C-arm imaging. A depth gauge was used to measure the length of the long nail to be used. It was measured to be 380 mm. The actual nail was then inserted into the proximal femur, across the fracture site and down the femoral shaft. Its position was confirmed on AP and lateral C-arm images.  The ball tip guidewire was removed.  Once the nail was completely seated, the drill guide for the lag screw was placed through the guide arm for the Affixus nail. A guidepin was then placed through this drill guide and advanced through the lateral cortex of the femur, across the fracture site and into the femoral head achieving a tip apex distance of less than 25 mm. The length of the drill pin was measured to be 105 mm, and  then the drill for the lag screw was advanced through the lateral cortex, across the fracture site and up into the femoral head to the depth of the lag screw.  The actual 105 mm lag screw was then advanced by hand into position across the fracture site into the femoral head. Its final position was confirmed on AP and lateral  C-arm images. Compression was applied as traction was carefully released. The set screw in the top of the intramedullary rod was tightened by hand using a screwdriver. It was backed off a quarter turn to allow for compression at the fracture site.  The attention was then turned to placement of the distal interlocking screws. A perfect circle technique was used.  Two small stab incisions were made over the distal interlocking screw holes.  A free hand technique was used to drill both distal interlocking screws.  The depth of the screw holes was measured with a depth gauge. The 11mm and 56 mm screws were then advanced into position and tightened by hand. Final C-arm images of the entire intramedullary construct were taken in both the AP and lateral planes.   The wounds were irrigated copiously and closed with 0 Vicryl for closure of the deep fascia and 2-0 Vicryl for subcutaneous closure. The skin was approximated with staples. A dry sterile dressing was applied. I was scrubbed and present the entire case and all sharp, sponge and instrument counts were correct at the conclusion of the case. Patient was transferred to a hospital bed and brought to PACU in stable condition.     Kathreen Devoid, MD

## 2020-03-09 NOTE — Plan of Care (Signed)
°  Problem: Education: Goal: Knowledge of General Education information will improve Description: Including pain rating scale, medication(s)/side effects and non-pharmacologic comfort measures Outcome: Progressing   Problem: Health Behavior/Discharge Planning: Goal: Ability to manage health-related needs will improve Outcome: Progressing   Problem: Clinical Measurements: Goal: Ability to maintain clinical measurements within normal limits will improve Outcome: Progressing Goal: Will remain free from infection Outcome: Progressing Goal: Diagnostic test results will improve Outcome: Progressing Goal: Respiratory complications will improve Outcome: Progressing Goal: Cardiovascular complication will be avoided Outcome: Progressing   Problem: Activity: Goal: Risk for activity intolerance will decrease Outcome: Not Progressing   Problem: Elimination: Goal: Will not experience complications related to bowel motility Outcome: Progressing Goal: Will not experience complications related to urinary retention Outcome: Progressing   Problem: Pain Managment: Goal: General experience of comfort will improve Outcome: Progressing   Problem: Skin Integrity: Goal: Risk for impaired skin integrity will decrease Outcome: Progressing

## 2020-03-09 NOTE — Anesthesia Procedure Notes (Signed)
Spinal  Patient location during procedure: OR Start time: 03/09/2020 4:20 PM End time: 03/09/2020 4:28 PM Staffing Performed: resident/CRNA  Resident/CRNA: Nelda Marseille, CRNA Preanesthetic Checklist Completed: patient identified, IV checked, site marked, risks and benefits discussed, surgical consent, monitors and equipment checked, pre-op evaluation and timeout performed Spinal Block Patient position: sitting Prep: Betadine Patient monitoring: heart rate, continuous pulse ox, blood pressure and cardiac monitor Approach: midline Location: L3-4 Injection technique: single-shot Needle Needle type: Whitacre and Introducer  Needle gauge: 25 G Needle length: 9 cm Assessment Sensory level: T10 Additional Notes Negative paresthesia. Negative blood return. Positive free-flowing CSF. Expiration date of kit checked and confirmed. Patient tolerated procedure well, without complications.

## 2020-03-09 NOTE — Progress Notes (Signed)
PROGRESS NOTE    Perry Bullock  RDE:[id number] DOB: 22-Nov-1960 DOA: 03/08/2020 PCP: [facility]  Chief complaint.  Hip pain.  Brief Narrative:   AYVIN LIPINSKI is a 59 y.o. male with medical history significant of alcohol abuse with recurrent hospitalization, bipolar disorder, anxiety disorder, asthma, previous multiple fractures, COPD, hepatitis C, peripheral vascular disease and degenerative disc disease who has had previous right femoral neck fracture from a fall. He had a fall before admission due to intoxication from alcohol.  Sustained a left hip fracture.  9/10.  No confusion today.  Elevated blood pressure and heart rate due to pain.  No evidence of alcohol withdrawal.  Surgery scheduled for today.  Assessment & Plan:   Principal Problem:   Displaced intertrochanteric fracture of left femur, initial encounter for closed fracture [facility]) Active Problems:   Alcohol abuse   Asthma   Bipolar 1 disorder (HCC)   COPD (chronic obstructive pulmonary disease) (HCC)   #1.  Left hip fracture. Surgery scheduled for today.  No major risk factors for surgery, no additional work-up needed.  #2.  Alcohol abuse. Currently no evidence of alcohol withdrawal.  Continue CIWA protocol, I will also start Librium tomorrow morning.  #3.  Bipolar disorder. Continue home medicines.  4.  COPD. No exacerbation.    DVT prophylaxis: SCDs currently, Lovenox postop. Code Status: Full Family Communication: None  .   Status is: Inpatient  Remains inpatient appropriate because:Inpatient level of care appropriate due to severity of illness   Dispo: The patient is from: Home              Anticipated d/c is to: Home              Anticipated d/c date is: 3 days              Patient currently is not medically stable to d/c.        I/O last 3 completed shifts: In: 601.6 [I.V.:601.6] Out: -  No intake/output data recorded.     Consultants:    Orthopedics.  Procedures: Pending  Antimicrobials: None  Subjective: Still complaining hip pain receiving pain medicine.  No confusion today, denies any short of breath or cough.  No abdominal pain nausea vomiting.  Last bowel movement yesterday.  No fever or chills.  Objective: Vitals:   03/08/20 2304 03/09/20 0008 03/09/20 0246 03/09/20 0621  BP: (!) 124/98 (!) 139/91 (!) 163/97 (!) 164/110  Pulse: (!) 116 (!) 113 (!) 117 (!) 124  Resp: 18 19 16 15   Temp: 98.9 F (37.2 C) 98.2 F (36.8 C) (!) 97.5 F (36.4 C) 100.1 F (37.8 C)  TempSrc: Oral Oral Oral Oral  SpO2: 99% 100% 99% 100%  Weight:      Height:  5\' 11"  (1.803 m)      Intake/Output Summary (Last 24 hours) at 03/09/2020 0823 Last data filed at 03/09/2020 0400 Gross per 24 hour  Intake 601.63 ml  Output --  Net 601.63 ml   Filed Weights   03/08/20 1513  Weight: 77.1 kg    Examination:  General exam: Appears calm and comfortable  Respiratory system: Clear to auscultation. Respiratory effort normal. Cardiovascular system: S1 & S2 heard, tachycardic, no JVD, murmurs, rubs, gallops or clicks. No pedal edema. Gastrointestinal system: Abdomen is nondistended, soft and nontender. No organomegaly or masses felt. Normal bowel sounds heard. Central nervous system: Alert and oriented. No focal neurological deficits. Extremities: Symmetric  Skin: No rashes, lesions or  ulcers Psychiatry: Judgement and insight appear normal. Mood & affect appropriate.     Data Reviewed: I have personally reviewed following labs and imaging studies  CBC: Recent Labs  Lab 03/08/20 1740 03/08/20 2132 03/09/20 0437  WBC 9.0 10.6* 9.0  NEUTROABS 6.3  --   --   HGB 12.1* 11.9* 12.4*  HCT 35.2* 36.3* 36.3*  MCV 98.6 102.0* 102.0*  PLT 199 196 157   Basic Metabolic Panel: Recent Labs  Lab 03/08/20 1740 03/08/20 2132 03/09/20 0649  NA 140 142 140  K 3.7 3.5 4.2  CL 105 106 108  CO2 22 24 23   GLUCOSE 89 84 123*  BUN 7 7 7    CREATININE 0.67 0.61 0.61  CALCIUM 8.4* 8.2* 8.2*  MG  --  1.4*  --   PHOS  --  4.4  --    GFR: Estimated Creatinine Clearance: 105.9 mL/min (by C-G formula based on SCr of 0.61 mg/dL). Liver Function Tests: Recent Labs  Lab 03/08/20 2132 03/09/20 0649  AST 65* 65*  ALT 27 28  ALKPHOS 114 113  BILITOT 1.0 1.6*  PROT 6.6 6.1*  ALBUMIN 3.4* 3.2*   No results for input(s): LIPASE, AMYLASE in the last 168 hours. No results for input(s): AMMONIA in the last 168 hours. Coagulation Profile: Recent Labs  Lab 03/08/20 1740  INR 1.0   Cardiac Enzymes: No results for input(s): CKTOTAL, CKMB, CKMBINDEX, TROPONINI in the last 168 hours. BNP (last 3 results) No results for input(s): PROBNP in the last 8760 hours. HbA1C: No results for input(s): HGBA1C in the last 72 hours. CBG: No results for input(s): GLUCAP in the last 168 hours. Lipid Profile: No results for input(s): CHOL, HDL, LDLCALC, TRIG, CHOLHDL, LDLDIRECT in the last 72 hours. Thyroid Function Tests: No results for input(s): TSH, T4TOTAL, FREET4, T3FREE, THYROIDAB in the last 72 hours. Anemia Panel: No results for input(s): VITAMINB12, FOLATE, FERRITIN, TIBC, IRON, RETICCTPCT in the last 72 hours. Sepsis Labs: No results for input(s): PROCALCITON, LATICACIDVEN in the last 168 hours.  Recent Results (from the past 240 hour(s))  SARS Coronavirus 2 by RT PCR (hospital order, performed in [facility] hospital lab) Nasopharyngeal Nasopharyngeal Swab     Status: None   Collection Time: 03/08/20  4:47 PM   Specimen: Nasopharyngeal Swab  Result Value Ref Range Status   SARS Coronavirus 2 NEGATIVE NEGATIVE Final    Comment: (NOTE) SARS-CoV-2 target nucleic acids are NOT DETECTED.  The SARS-CoV-2 RNA is generally detectable in upper and lower respiratory specimens during the acute phase of infection. The lowest concentration of SARS-CoV-2 viral copies this assay can detect is 250 copies / mL. A negative result does not  preclude SARS-CoV-2 infection and should not be used as the sole basis for treatment or other patient management decisions.  A negative result may occur with improper specimen collection / handling, submission of specimen other than nasopharyngeal swab, presence of viral mutation(s) within the areas targeted by this assay, and inadequate number of viral copies (<250 copies / mL). A negative result must be combined with clinical observations, patient history, and epidemiological information.  Fact Sheet for Patients:   BoilerBrush.com.cyhttps://www.fda.gov/media/136312/download  Fact Sheet for Healthcare Providers: https://pope.com/https://www.fda.gov/media/136313/download  This test is not yet approved or  cleared by the [place] FDA and has been authorized for detection and/or diagnosis of SARS-CoV-2 by FDA under an Emergency Use Authorization (EUA).  This EUA will remain in effect (meaning this test can be used) for the duration of the  COVID-19 declaration under Section 564(b)(1) of the Act, 21 U.S.C. section 360bbb-3(b)(1), unless the authorization is terminated or revoked sooner.  Performed at [facility], [address] [place], [place] [address]          Radiology Studies: DG Chest 1 View  Result Date: 03/08/2020 CLINICAL DATA:  Preop EXAM: CHEST  1 VIEW COMPARISON:  January 13, 2019 FINDINGS: There is an old right clavicle fracture. The patient is status post prior left total shoulder arthroplasty. There appears to be stable lucency about the left shoulder prosthesis. There is stable pleural thickening along the mid left chest wall. Stable coarse airspace opacities are noted throughout both lung fields most evident in the right upper lobe. There are old healed left-sided rib fractures. There is no pneumothorax. The heart size is stable. Aortic calcifications are noted. IMPRESSION: No active disease. Electronically Signed   By: Katherine Mantle M.D.   On: 03/08/2020 16:20   CT Head Wo  Contrast  Result Date: 03/08/2020 CLINICAL DATA:  Pain after fall. EXAM: CT HEAD WITHOUT CONTRAST TECHNIQUE: Contiguous axial images were obtained from the base of the skull through the vertex without intravenous contrast. COMPARISON:  Most recent head CT 01/15/2017 FINDINGS: Brain: Generalized atrophy is similar to prior exam, advanced for age. Mild periventricular chronic small vessel ischemia. No intracranial hemorrhage, mass effect, or midline shift. No hydrocephalus. The basilar cisterns are patent. No evidence of territorial infarct or acute ischemia. No extra-axial or intracranial fluid collection. Vascular: No hyperdense vessel. Skull: No fracture or focal lesion. Sinuses/Orbits: No acute findings. Chronic opacification of lower lateral left mastoid air cells. Remote medial left orbital wall fracture. Remote nasal bone fracture with rightward nasal septal bowing. Other: None. IMPRESSION: 1. No acute intracranial abnormality. No skull fracture. 2. Unchanged atrophy, age advanced. Electronically Signed   By: Narda Rutherford M.D.   On: 03/08/2020 16:32   CT Cervical Spine Wo Contrast  Result Date: 03/08/2020 CLINICAL DATA:  Pain after fall. EXAM: CT CERVICAL SPINE WITHOUT CONTRAST TECHNIQUE: Multidetector CT imaging of the cervical spine was performed without intravenous contrast. Multiplanar CT image reconstructions were also generated. COMPARISON:  None. FINDINGS: Alignment: No evidence of traumatic malalignment. 3 mm anterolisthesis of C4 on C5, 2 mm retrolisthesis of C5 on C6 appears degenerative. Mild straightening of normal lordosis. Skull base and vertebrae: No acute fracture. Modic endplate changes at C5-C6. The dens and skull base are intact. Soft tissues and spinal canal: No prevertebral fluid or swelling. No visible canal hematoma. Disc levels: Diffuse degenerative disc disease, most prominent at C5-C6 where there is near complete disc space loss suggestion Modic endplate changes. Multilevel  facet hypertrophy, prominent at C3-C4 on the right and CT C3 on the left. Multilevel neural foraminal stenosis. Upper chest: Stable appearance of cavitary focus in the right lung apex from 02/01/2019 chest CT. No acute findings. Other: None. IMPRESSION: 1. No acute fracture or subluxation of the cervical spine. 2. Multilevel degenerative disc disease and facet hypertrophy. Electronically Signed   By: Narda Rutherford M.D.   On: 03/08/2020 16:39   DG Hip Unilat W or Wo Pelvis 2-3 Views Left  Result Date: 03/08/2020 CLINICAL DATA:  Larey Seat, left hip pain EXAM: DG HIP (WITH OR WITHOUT PELVIS) 2-3V LEFT COMPARISON:  None. FINDINGS: Frontal view of the pelvis as well as frontal and cross-table lateral views of the left hip are obtained. There is a comminuted intertrochanteric left hip fracture with varus angulation and impaction at the fracture site. No dislocation.  Chronic postsurgical changes are seen within the right hip and femur. The remainder of the bony pelvis is unremarkable. Moderate spondylosis and facet hypertrophy at the lumbosacral junction. IMPRESSION: 1. Comminuted impacted intertrochanteric left hip fracture. Electronically Signed   By: Sharlet Salina M.D.   On: 03/08/2020 16:16        Scheduled Meds: . [START ON 03/10/2020] chlordiazePOXIDE  25 mg Oral TID  . folic acid  1 mg Oral Daily  . multivitamin with minerals  1 tablet Oral Daily  . thiamine  100 mg Oral Daily   Or  . thiamine  100 mg Intravenous Daily   Continuous Infusions: .  ceFAZolin (ANCEF) IV    . dextrose 5 % and 0.9% NaCl 100 mL/hr at 03/09/20 0400  . magnesium sulfate bolus IVPB       LOS: 1 day    Time spent: 27 minutes    Marrion Coy, MD [facility]   To contact the attending provider between 7A-7P or the covering provider during after hours 7P-7A, please log into the web site [email or web address] and access using universal [place] password for that web site. If you do not have the password, please  call the hospital operator.  03/09/2020, 8:23 AM

## 2020-03-09 NOTE — Transfer of Care (Signed)
Immediate Anesthesia Transfer of Care Note  Patient: Perry Bullock  Procedure(s) Performed: INTRAMEDULLARY (IM) NAIL INTERTROCHANTRIC (Left )  Patient Location: PACU  Anesthesia Type:Spinal  Level of Consciousness: awake, alert  and oriented  Airway & Oxygen Therapy: Patient Spontanous Breathing and Patient connected to face mask oxygen  Post-op Assessment: Report given to RN and Post -op Vital signs reviewed and stable  Post vital signs: Reviewed and stable  Last Vitals:  Vitals Value Taken Time  BP 125/84 03/09/20 1756  Temp    Pulse 97 03/09/20 1759  Resp 19 03/09/20 1759  SpO2 100 % 03/09/20 1759  Vitals shown include unvalidated device data.  Last Pain:  Vitals:   03/09/20 1552  TempSrc: Tympanic  PainSc: 10-Worst pain ever      Patients Stated Pain Goal: 0 (03/08/20 2304)  Complications: No complications documented.

## 2020-03-09 NOTE — Anesthesia Procedure Notes (Signed)
Date/Time: 03/09/2020 4:48 PM Performed by: Junious Silk, CRNA Pre-anesthesia Checklist: Patient identified, Emergency Drugs available, Suction available, Patient being monitored and Timeout performed Oxygen Delivery Method: Simple face mask

## 2020-03-10 DIAGNOSIS — I959 Hypotension, unspecified: Secondary | ICD-10-CM

## 2020-03-10 DIAGNOSIS — I9589 Other hypotension: Secondary | ICD-10-CM

## 2020-03-10 DIAGNOSIS — E876 Hypokalemia: Secondary | ICD-10-CM

## 2020-03-10 DIAGNOSIS — D62 Acute posthemorrhagic anemia: Secondary | ICD-10-CM

## 2020-03-10 DIAGNOSIS — D696 Thrombocytopenia, unspecified: Secondary | ICD-10-CM

## 2020-03-10 LAB — CBC WITH DIFFERENTIAL/PLATELET
Abs Immature Granulocytes: 0.01 10*3/uL (ref 0.00–0.07)
Basophils Absolute: 0 10*3/uL (ref 0.0–0.1)
Basophils Relative: 1 %
Eosinophils Absolute: 0.2 10*3/uL (ref 0.0–0.5)
Eosinophils Relative: 3 %
HCT: 22.8 % — ABNORMAL LOW (ref 39.0–52.0)
Hemoglobin: 8 g/dL — ABNORMAL LOW (ref 13.0–17.0)
Immature Granulocytes: 0 %
Lymphocytes Relative: 19 %
Lymphs Abs: 1.1 10*3/uL (ref 0.7–4.0)
MCH: 34.3 pg — ABNORMAL HIGH (ref 26.0–34.0)
MCHC: 35.1 g/dL (ref 30.0–36.0)
MCV: 97.9 fL (ref 80.0–100.0)
Monocytes Absolute: 0.7 10*3/uL (ref 0.1–1.0)
Monocytes Relative: 12 %
Neutro Abs: 3.7 10*3/uL (ref 1.7–7.7)
Neutrophils Relative %: 65 %
Platelets: 107 10*3/uL — ABNORMAL LOW (ref 150–400)
RBC: 2.33 MIL/uL — ABNORMAL LOW (ref 4.22–5.81)
RDW: 12.3 % (ref 11.5–15.5)
Smear Review: NORMAL
WBC: 5.7 10*3/uL (ref 4.0–10.5)
nRBC: 0 % (ref 0.0–0.2)

## 2020-03-10 LAB — BASIC METABOLIC PANEL
Anion gap: 7 (ref 5–15)
BUN: 7 mg/dL (ref 6–20)
CO2: 23 mmol/L (ref 22–32)
Calcium: 7.6 mg/dL — ABNORMAL LOW (ref 8.9–10.3)
Chloride: 106 mmol/L (ref 98–111)
Creatinine, Ser: 0.62 mg/dL (ref 0.61–1.24)
GFR calc Af Amer: >60 mL/min (ref >60)
GFR calc non Af Amer: >60 mL/min (ref >60)
Glucose, Bld: 110 mg/dL — ABNORMAL HIGH (ref 70–99)
Potassium: 3.2 mmol/L — ABNORMAL LOW (ref 3.5–5.1)
Sodium: 136 mmol/L (ref 135–145)

## 2020-03-10 LAB — MAGNESIUM: Magnesium: 1.5 mg/dL — ABNORMAL LOW (ref 1.7–2.4)

## 2020-03-10 MED ORDER — LACTATED RINGERS IV SOLN: INTRAVENOUS | Status: DC

## 2020-03-10 MED ORDER — POTASSIUM CHLORIDE IN NACL 20-0.9 MEQ/L-% IV SOLN
INTRAVENOUS | Status: DC
  Filled 2020-03-10 (×3): qty 1000

## 2020-03-10 MED ORDER — APIXABAN 2.5 MG PO TABS
2.5000 mg | ORAL_TABLET | Freq: Two times a day (BID) | ORAL | Status: DC
  Administered 2020-03-10 – 2020-03-15 (×11): 2.5 mg via ORAL
  Filled 2020-03-10 (×10): qty 1

## 2020-03-10 MED ORDER — POTASSIUM CHLORIDE 20 MEQ PO PACK
40.0000 meq | PACK | ORAL | Status: AC
  Administered 2020-03-10 (×2): 40 meq via ORAL
  Filled 2020-03-10: qty 2

## 2020-03-10 MED ORDER — MAGNESIUM SULFATE 2 GM/50ML IV SOLN
2.0000 g | Freq: Once | INTRAVENOUS | Status: AC
  Administered 2020-03-10: 2 g via INTRAVENOUS
  Filled 2020-03-10: qty 50

## 2020-03-10 MED ORDER — MAGNESIUM SULFATE 50 % IJ SOLN: 3.0000 g | Freq: Once | INTRAVENOUS | Status: DC

## 2020-03-10 MED ORDER — SODIUM CHLORIDE 0.9 % IV BOLUS
500.0000 mL | Freq: Once | INTRAVENOUS | Status: AC
  Administered 2020-03-10: 500 mL via INTRAVENOUS

## 2020-03-10 MED ORDER — MAGNESIUM SULFATE IN D5W 1-5 GM/100ML-% IV SOLN
1.0000 g | Freq: Once | INTRAVENOUS | Status: AC
  Administered 2020-03-10: 1 g via INTRAVENOUS
  Filled 2020-03-10: qty 100

## 2020-03-10 MED ORDER — ALBUMIN HUMAN 25 % IV SOLN
50.0000 g | Freq: Once | INTRAVENOUS | Status: AC
  Administered 2020-03-10: 50 g via INTRAVENOUS
  Filled 2020-03-10: qty 200
  Filled 2020-03-10: qty 100

## 2020-03-10 NOTE — Progress Notes (Addendum)
Patient's BP 83/61, HR 136, notified on call provider B. Jon Billings NP, received verbal order for 1L NS, blood sugar 138. Patient lethargic but will answer appropriately and respond. Recheck BP 79/50, HR 129. Notified B. Jon Billings NP second time of BP. Requested B. Jon Billings NP to assess the patient at bedside. Advised to recheck BP after bolus is finished if patient is A/O, if BP is still low she would come assess patient. MEWS score is 3.

## 2020-03-10 NOTE — Progress Notes (Signed)
   03/09/20 2352  Assess: MEWS Score  BP (!) 79/50  Pulse Rate (!) 131  Resp 18  SpO2 94 %  Assess: MEWS Score  MEWS Temp 0  MEWS Systolic 2  MEWS Pulse 3  MEWS RR 0  MEWS LOC 0  MEWS Score 5  MEWS Score Color Red  Assess: if the MEWS score is Yellow or Red  Were vital signs taken at a resting state? Yes  Focused Assessment Change from prior assessment (see assessment flowsheet)  Early Detection of Sepsis Score *See Row Information* Low  MEWS guidelines implemented *See Row Information* Yes  Treat  MEWS Interventions Escalated (See documentation below)  Notify: Provider  Provider Name/Title Manuela Schwartz NP  Date Provider Notified 03/09/20  Time Provider Notified 2352  Notification Type Call  Notification Reason Change in status  Response No new orders  Date of Provider Response 03/09/20  Time of Provider Response 2352  Notify: Rapid Response  Name of Rapid Response RN Notified Artis Delay RN (discussed with ICU charge)  Date Rapid Response Notified 03/09/20  Time Rapid Response Notified 2352  Document  Patient Outcome Not stable and remains on department  Progress note created (see row info) Yes

## 2020-03-10 NOTE — Progress Notes (Signed)
   03/09/20 2344  Assess: MEWS Score  BP (!) 83/61  Assess: MEWS Score  MEWS Temp 0  MEWS Systolic 1  MEWS Pulse 1  MEWS RR 0  MEWS LOC 0  MEWS Score 2  MEWS Score Color Yellow  Assess: if the MEWS score is Yellow or Red  Were vital signs taken at a resting state? Yes  Focused Assessment Change from prior assessment (see assessment flowsheet)  Early Detection of Sepsis Score *See Row Information* Low  MEWS guidelines implemented *See Row Information* Yes  Treat  MEWS Interventions Other (Comment);Administered scheduled meds/treatments (bolus)  Take Vital Signs  Increase Vital Sign Frequency  Yellow: Q 2hr X 2 then Q 4hr X 2, if remains yellow, continue Q 4hrs  Escalate  MEWS: Escalate Yellow: discuss with charge nurse/RN and consider discussing with provider and RRT  Notify: Charge Nurse/RN  Name of Charge Nurse/RN Notified Jinger Neighbors RN  Date Charge Nurse/RN Notified 03/09/20  Time Charge Nurse/RN Notified 2344  Notify: Provider  Provider Name/Title Manuela Schwartz NP  Date Provider Notified 03/09/20  Time Provider Notified 2344  Notification Type Call  Notification Reason Change in status  Response See new orders  Date of Provider Response 03/09/20  Time of Provider Response 2344  Document  Patient Outcome Not stable and remains on department  Progress note created (see row info) Yes

## 2020-03-10 NOTE — Progress Notes (Signed)
PT Cancellation Note  Patient Details Name: Perry Bullock MRN: [id number] DOB: [birth year]   Cancelled Treatment:    Reason Eval/Treat Not Completed: Fatigue/lethargy limiting ability to participate Patient asleep on PT arrival, wife reporting this is the first time he has been able to rest "in days". Once aroused patient refusing PT and quickly back to sleep.   Hilda Lias DPT Hilda Lias 03/10/2020, 2:24 PM

## 2020-03-10 NOTE — Progress Notes (Addendum)
PROGRESS NOTE    Perry Bullock  WCB:[id number] DOB: [birth year] DOA: 03/08/2020 PCP: [facility]   Chief complaint.  Hip pain. Brief Narrative:  Perry Channing Chriscoeis a 59 y.o.malewith medical history significant ofalcohol abuse with recurrent hospitalization, bipolar disorder, anxiety disorder, asthma, previous multiple fractures, COPD, hepatitis C, peripheral vascular disease and degenerative disc disease who has had previous right femoral neck fracture from a fall. He had a fall before admission due to intoxication from alcohol.  Sustained a left hip fracture.  9/10.  No confusion today.  Elevated blood pressure and heart rate due to pain.  No evidence of alcohol withdrawal.    Hip surgery with intertrochanteric nail is performed.  9/11.  Blood pressure is low this morning, give 500 mL fluid bolus.  Hemoglobin stable.   Assessment & Plan:   Principal Problem:   Displaced intertrochanteric fracture of left femur, initial encounter for closed fracture [facility]) Active Problems:   Alcohol abuse   Asthma   Bipolar 1 disorder (HCC)   COPD (chronic obstructive pulmonary disease) (HCC)  #1.  Left hip fracture. Status post surgery. Still complaining of pain, continue as needed pain medicine. PT OT started.  2.  Hypotension. Secondary to dehydration from surgery. Continue IV fluid, give 500 mL fluid bolus.  3.  Alcohol abuse. Currently patient does not have significant withdrawal.  Started Librium and as needed Ativan.  Continue thiamine folic acid.  4.  Acute blood loss anemia. Hemoglobin 8.0.  Continue to monitor, transfuse as needed.  5.  Thrombocytopenia Most likely due to fracture and acute blood loss.  Will follow.  Discontinue Lovenox, changed to Eliquis for prophylaxis.  6.  Hypokalemia and hypomagnesemia. Supplement.  7.  Bipolar disorder.  Continue home medicine.  8.  COPD.  No exacerbation.     DVT prophylaxis: Eliquis Code Status:  Full Family Communication: None     Status is: Inpatient  Remains inpatient appropriate because:Inpatient level of care appropriate due to severity of illness   Dispo: The patient is from: Home              Anticipated d/c is to: Based on recommendation from physical therapy.              Anticipated d/c date is: 2 days              Patient currently is not medically stable to d/c.        I/O last 3 completed shifts: In: 1976.6 [P.O.:75; I.V.:1801.6; IV Piggyback:100] Out: 1175 [Urine:1075; Blood:100] Total I/O In: -  Out: 300 [Urine:300]     Consultants:   Orthopedics  Procedures: Hip surgery.  Antimicrobials:None  Subjective: Patient still complaining of hip pain.  Denies any short of breath or cough.  Good appetite without nausea vomiting.  Low blood pressure noted this morning.  No fever or chills.  No dysuria hematuria.  Objective: Vitals:   03/10/20 0451 03/10/20 0541 03/10/20 0632 03/10/20 0758  BP: 92/65 (!) 89/64 94/64 (!) 88/55  Pulse: 91 89 88 87  Resp: 16 16 16 19   Temp:    98.7 F (37.1 C)  TempSrc:    Oral  SpO2: 96% 96% 96% 95%  Weight:      Height:        Intake/Output Summary (Last 24 hours) at 03/10/2020 0948 Last data filed at 03/10/2020 0721 Gross per 24 hour  Intake 1375 ml  Output 1475 ml  Net -100 ml   05/10/2020  03/08/20 1513  Weight: 77.1 kg    Examination:  General exam: Appears calm and comfortable  Respiratory system: Clear to auscultation. Respiratory effort normal. Cardiovascular system: S1 & S2 heard, RRR. No JVD, murmurs, rubs, gallops or clicks. No pedal edema. Gastrointestinal system: Abdomen is nondistended, soft and nontender. No organomegaly or masses felt. Normal bowel sounds heard. Central nervous system: Alert and oriented. No focal neurological deficits. Extremities: Symmetric 5 x 5 power. Skin: No rashes, lesions or ulcers Psychiatry:  Mood & affect appropriate.     Data Reviewed: I have  personally reviewed following labs and imaging studies  CBC: Recent Labs  Lab 03/08/20 1740 03/08/20 2132 03/09/20 0437 03/10/20 0426  WBC 9.0 10.6* 9.0 5.7  NEUTROABS 6.3  --   --  3.7  HGB 12.1* 11.9* 12.4* 8.0*  HCT 35.2* 36.3* 36.3* 22.8*  MCV 98.6 102.0* 102.0* 97.9  PLT 199 196 157 107*   Basic Metabolic Panel: Recent Labs  Lab 03/08/20 1740 03/08/20 2132 03/09/20 0649 03/10/20 0426  NA 140 142 140 136  K 3.7 3.5 4.2 3.2*  CL 105 106 108 106  CO2 22 24 23 23   GLUCOSE 89 84 123* 110*  BUN 7 7 7 7   CREATININE 0.67 0.61 0.61 0.62  CALCIUM 8.4* 8.2* 8.2* 7.6*  MG  --  1.4*  --  1.5*  PHOS  --  4.4  --   --    GFR: Estimated Creatinine Clearance: 105.9 mL/min (by C-G formula based on SCr of 0.62 mg/dL). Liver Function Tests: Recent Labs  Lab 03/08/20 2132 03/09/20 0649  AST 65* 65*  ALT 27 28  ALKPHOS 114 113  BILITOT 1.0 1.6*  PROT 6.6 6.1*  ALBUMIN 3.4* 3.2*   No results for input(s): LIPASE, AMYLASE in the last 168 hours. No results for input(s): AMMONIA in the last 168 hours. Coagulation Profile: Recent Labs  Lab 03/08/20 1740  INR 1.0   Cardiac Enzymes: No results for input(s): CKTOTAL, CKMB, CKMBINDEX, TROPONINI in the last 168 hours. BNP (last 3 results) No results for input(s): PROBNP in the last 8760 hours. HbA1C: No results for input(s): HGBA1C in the last 72 hours. CBG: Recent Labs  Lab 03/09/20 2352  GLUCAP 138*   Lipid Profile: No results for input(s): CHOL, HDL, LDLCALC, TRIG, CHOLHDL, LDLDIRECT in the last 72 hours. Thyroid Function Tests: No results for input(s): TSH, T4TOTAL, FREET4, T3FREE, THYROIDAB in the last 72 hours. Anemia Panel: No results for input(s): VITAMINB12, FOLATE, FERRITIN, TIBC, IRON, RETICCTPCT in the last 72 hours. Sepsis Labs: No results for input(s): PROCALCITON, LATICACIDVEN in the last 168 hours.  Recent Results (from the past 240 hour(s))  SARS Coronavirus 2 by RT PCR (hospital order, performed in  [facility] hospital lab) Nasopharyngeal Nasopharyngeal Swab     Status: None   Collection Time: 03/08/20  4:47 PM   Specimen: Nasopharyngeal Swab  Result Value Ref Range Status   SARS Coronavirus 2 NEGATIVE NEGATIVE Final    Comment: (NOTE) SARS-CoV-2 target nucleic acids are NOT DETECTED.  The SARS-CoV-2 RNA is generally detectable in upper and lower respiratory specimens during the acute phase of infection. The lowest concentration of SARS-CoV-2 viral copies this assay can detect is 250 copies / mL. A negative result does not preclude SARS-CoV-2 infection and should not be used as the sole basis for treatment or other patient management decisions.  A negative result may occur with improper specimen collection / handling, submission of specimen other than nasopharyngeal swab, presence  of viral mutation(s) within the areas targeted by this assay, and inadequate number of viral copies (<250 copies / mL). A negative result must be combined with clinical observations, patient history, and epidemiological information.  Fact Sheet for Patients:   BoilerBrush.com.cyhttps://www.fda.gov/media/136312/download  Fact Sheet for Healthcare Providers: https://pope.com/https://www.fda.gov/media/136313/download  This test is not yet approved or  cleared by the [place] FDA and has been authorized for detection and/or diagnosis of SARS-CoV-2 by FDA under an Emergency Use Authorization (EUA).  This EUA will remain in effect (meaning this test can be used) for the duration of the COVID-19 declaration under Section 564(b)(1) of the Act, 21 U.S.C. section 360bbb-3(b)(1), unless the authorization is terminated or revoked sooner.  Performed at [facility], [address] [place], [place] [address]          Radiology Studies: DG Chest 1 View  Result Date: 03/08/2020 CLINICAL DATA:  Preop EXAM: CHEST  1 VIEW COMPARISON:  January 13, 2019 FINDINGS: There is an old right clavicle fracture. The patient is status post  prior left total shoulder arthroplasty. There appears to be stable lucency about the left shoulder prosthesis. There is stable pleural thickening along the mid left chest wall. Stable coarse airspace opacities are noted throughout both lung fields most evident in the right upper lobe. There are old healed left-sided rib fractures. There is no pneumothorax. The heart size is stable. Aortic calcifications are noted. IMPRESSION: No active disease. Electronically Signed   By: Katherine Mantlehristopher  Green M.D.   On: 03/08/2020 16:20   CT Head Wo Contrast  Result Date: 03/08/2020 CLINICAL DATA:  Pain after fall. EXAM: CT HEAD WITHOUT CONTRAST TECHNIQUE: Contiguous axial images were obtained from the base of the skull through the vertex without intravenous contrast. COMPARISON:  Most recent head CT 01/15/2017 FINDINGS: Brain: Generalized atrophy is similar to prior exam, advanced for age. Mild periventricular chronic small vessel ischemia. No intracranial hemorrhage, mass effect, or midline shift. No hydrocephalus. The basilar cisterns are patent. No evidence of territorial infarct or acute ischemia. No extra-axial or intracranial fluid collection. Vascular: No hyperdense vessel. Skull: No fracture or focal lesion. Sinuses/Orbits: No acute findings. Chronic opacification of lower lateral left mastoid air cells. Remote medial left orbital wall fracture. Remote nasal bone fracture with rightward nasal septal bowing. Other: None. IMPRESSION: 1. No acute intracranial abnormality. No skull fracture. 2. Unchanged atrophy, age advanced. Electronically Signed   By: Narda RutherfordMelanie  Sanford M.D.   On: 03/08/2020 16:32   CT Cervical Spine Wo Contrast  Result Date: 03/08/2020 CLINICAL DATA:  Pain after fall. EXAM: CT CERVICAL SPINE WITHOUT CONTRAST TECHNIQUE: Multidetector CT imaging of the cervical spine was performed without intravenous contrast. Multiplanar CT image reconstructions were also generated. COMPARISON:  None. FINDINGS: Alignment:  No evidence of traumatic malalignment. 3 mm anterolisthesis of C4 on C5, 2 mm retrolisthesis of C5 on C6 appears degenerative. Mild straightening of normal lordosis. Skull base and vertebrae: No acute fracture. Modic endplate changes at C5-C6. The dens and skull base are intact. Soft tissues and spinal canal: No prevertebral fluid or swelling. No visible canal hematoma. Disc levels: Diffuse degenerative disc disease, most prominent at C5-C6 where there is near complete disc space loss suggestion Modic endplate changes. Multilevel facet hypertrophy, prominent at C3-C4 on the right and CT C3 on the left. Multilevel neural foraminal stenosis. Upper chest: Stable appearance of cavitary focus in the right lung apex from 02/01/2019 chest CT. No acute findings. Other: None. IMPRESSION: 1. No acute fracture or subluxation  of the cervical spine. 2. Multilevel degenerative disc disease and facet hypertrophy. Electronically Signed   By: Narda Rutherford M.D.   On: 03/08/2020 16:39   DG HIP OPERATIVE UNILAT W OR W/O PELVIS LEFT  Result Date: 03/09/2020 CLINICAL DATA:  Intramedullary nail placement EXAM: OPERATIVE LEFT HIP (WITH PELVIS IF PERFORMED) 4 VIEWS TECHNIQUE: Fluoroscopic spot image(s) were submitted for interpretation post-operatively. COMPARISON:  03/08/2020 FINDINGS: The patient has undergone intramedullary nail placement for the proximal left femur fracture. The alignment is improved. The hardware is intact. There are expected postsurgical changes. IMPRESSION: Status post ORIF of the left femur. Electronically Signed   By: Katherine Mantle M.D.   On: 03/09/2020 18:51   DG Hip Unilat W or Wo Pelvis 2-3 Views Left  Result Date: 03/08/2020 CLINICAL DATA:  Larey Seat, left hip pain EXAM: DG HIP (WITH OR WITHOUT PELVIS) 2-3V LEFT COMPARISON:  None. FINDINGS: Frontal view of the pelvis as well as frontal and cross-table lateral views of the left hip are obtained. There is a comminuted intertrochanteric left hip  fracture with varus angulation and impaction at the fracture site. No dislocation. Chronic postsurgical changes are seen within the right hip and femur. The remainder of the bony pelvis is unremarkable. Moderate spondylosis and facet hypertrophy at the lumbosacral junction. IMPRESSION: 1. Comminuted impacted intertrochanteric left hip fracture. Electronically Signed   By: Sharlet Salina M.D.   On: 03/08/2020 16:16   DG FEMUR PORT MIN 2 VIEWS LEFT  Result Date: 03/09/2020 CLINICAL DATA:  Hip fracture. EXAM: LEFT FEMUR PORTABLE 2 VIEWS COMPARISON:  03/08/2020 FINDINGS: The patient has undergone intramedullary nail placement for the known proximal femur fracture on the left. There are expected postsurgical changes. The alignment is improved. The hardware is intact. Vascular calcifications are noted. IMPRESSION: Expected postsurgical changes related to ORIF of the left femur. Electronically Signed   By: Katherine Mantle M.D.   On: 03/09/2020 18:44        Scheduled Meds:  acetaminophen  1,000 mg Oral Q6H   allopurinol  100 mg Oral Daily   chlordiazePOXIDE  25 mg Oral TID   Chlorhexidine Gluconate Cloth  6 each Topical Daily   docusate sodium  100 mg Oral BID   enoxaparin (LOVENOX) injection  40 mg Subcutaneous Q24H   fluticasone  2 spray Each Nare Daily   folic acid  1 mg Oral Daily   gabapentin  600 mg Oral TID   ketorolac  7.5 mg Intravenous Q6H   loratadine  10 mg Oral Daily   mometasone-formoterol  2 puff Inhalation BID   multivitamin with minerals  1 tablet Oral Daily   pantoprazole  40 mg Oral Daily   potassium chloride  40 mEq Oral Q2H   senna  1 tablet Oral BID   thiamine  100 mg Oral Daily   Or   thiamine  100 mg Intravenous Daily   tiotropium  18 mcg Inhalation Daily   traMADol  50 mg Oral Q6H   Continuous Infusions:  dextrose 5 % and 0.9% NaCl 100 mL/hr at 03/09/20 2016   lactated ringers 100 mL/hr at 03/10/20 4098   magnesium sulfate bolus IVPB 2 g  (03/10/20 0938)   Followed by   magnesium sulfate bolus IVPB     sodium chloride       LOS: 2 days    Time spent: 28 minutes    Marrion Coy, MD [facility]   To contact the attending provider between 7A-7P or the covering provider during  after hours 7P-7A, please log into the web site [email or web address] and access using universal [place] password for that web site. If you do not have the password, please call the hospital operator.  03/10/2020, 9:48 AM

## 2020-03-10 NOTE — Progress Notes (Signed)
OT Cancellation Note  Patient Details Name: Perry Bullock MRN: [id number] DOB: 1961-01-03   Cancelled Treatment:    Reason Eval/Treat Not Completed: Other (comment) (OT initial visit attempted. Pt. sleeping upon arrival. Pt. wife requesting to hold as the pt. has just finally been able to rest, and sleep. Will reattempt inital visit on the next available treatment date.)  Olegario Messier, MS, OTR/L 03/10/2020, 1:38 PM

## 2020-03-10 NOTE — Evaluation (Signed)
Physical Therapy Evaluation Patient Details Name: Perry Bullock MRN: [id number] DOB: 01-12-1961 Today's Date: 03/10/2020   History of Present Illness  Perry Bullock is a 59 y.o. male with IM fixation of L intertrochanteric hip fx following fall. Past medical history significant of alcohol abuse with recurrent hospitalization, bipolar disorder, anxiety disorder, asthma, previous multiple fractures, COPD, hepatitis C, peripheral vascular disease and degenerative disc disease who has had previous right femoral neck fracture from a fall. Has been using a hurry-cane since multiple trauma fall in 2017.  Clinical Impression  Pt pain inhibiting ability to attempt upright transfers this session. Patient able to complete bed AAROM mobility with reported 10/10 pain during all LLE movements and facial grimace. Pt able to comply with education and cuing for scooting in bed requiring modA with BUE use. Pt transferred bed <> bed with PT and nursing assistance max x3 with increased pain. At this time PT recommendation is SNF placement d/t decreased independence in all transfers and mobility d/t pain. Would benefit from skilled PT to address above deficits and promote optimal return to PLOF.     Follow Up Recommendations SNF    Equipment Recommendations  Rolling walker with 5" wheels    Recommendations for Other Services OT consult     Precautions / Restrictions Precautions Precautions: Fall Restrictions Weight Bearing Restrictions: Yes LLE Weight Bearing: Weight bearing as tolerated Other Position/Activity Restrictions: BP      Mobility  Bed Mobility Overal bed mobility: Needs Assistance Bed Mobility: Rolling Rolling: Mod assist         General bed mobility comments: Bed <> bed transfer with nursing maxA x3; pt assisted with UEs for scooting in bed +1 modA  Transfers                 General transfer comment: not attempted d/t pain  Ambulation/Gait             General  Gait Details: not attempted d/t pain  Stairs            Wheelchair Mobility    Modified Rankin (Stroke Patients Only)       Balance                                             Pertinent Vitals/Pain Pain Assessment: Faces Pain Score: 10-Worst pain ever Faces Pain Scale: Hurts whole lot Pain Location: L hip Pain Descriptors / Indicators: Aching Pain Intervention(s): Limited activity within patient's tolerance;Repositioned;Monitored during session    Home Living Family/patient expects to be discharged to:: Private residence Living Arrangements: Spouse/significant other Available Help at Discharge: Family Type of Home: House Home Access: Stairs to enter Entrance Stairs-Rails: Can reach both Entrance Stairs-Number of Steps: 3 Home Layout: One level Home Equipment: Environmental consultant - 2 wheels;Bedside commode;Tub bench      Prior Function Level of Independence: Independent         Comments: Using hurrycane, significant other of 20 years endorses multiple falls from alcohol abuse     Hand Dominance   Dominant Hand: Right    Extremity/Trunk Assessment   Upper Extremity Assessment Upper Extremity Assessment: Overall WFL for tasks assessed    Lower Extremity Assessment Lower Extremity Assessment: Generalized weakness    Cervical / Trunk Assessment Cervical / Trunk Assessment: Normal  Communication   Communication: No difficulties  Cognition Arousal/Alertness: Awake/alert Behavior During Therapy: [facility]  for tasks assessed/performed Overall Cognitive Status: Within Functional Limits for tasks assessed                                        General Comments      Exercises General Exercises - Lower Extremity Ankle Circles/Pumps: AROM;20 reps;Both;Supine Long Arc Quad: AAROM;Strengthening;Left;5 reps;Supine Heel Slides: AAROM;5 reps;Strengthening;Left;Supine Hip ABduction/ADduction: AAROM;Strengthening;Supine;Left;5 reps Other  Exercises Other Exercises: Bed <> bed transfer x3 maxA d/t pain with patient able to follow commands to prevent resistance to transfer Other Exercises: Scooting up in bed x1 modA with patient able to utilize UEs for assitance following PT assistance for hand placement   Assessment/Plan    PT Assessment Patient needs continued PT services  PT Problem List Decreased strength;Decreased mobility;Decreased range of motion;Decreased coordination;Decreased activity tolerance;Decreased balance;Decreased knowledge of use of DME;Pain;Decreased safety awareness       PT Treatment Interventions Functional mobility training;Neuromuscular re-education;Manual techniques;Stair training;Balance training;Gait training;Therapeutic exercise;Patient/family education;DME instruction;Therapeutic activities    PT Goals (Current goals can be found in the Care Plan section)  Acute Rehab PT Goals Patient Stated Goal: go home PT Goal Formulation: With patient Time For Goal Achievement: 03/24/20 Potential to Achieve Goals: Fair    Frequency BID   Barriers to discharge Other (comment) alcohol abuse affecting compliance, per "wife" patient consistently falls from drinking    Co-evaluation               AM-PAC PT "6 Clicks" Mobility  Outcome Measure Help needed turning from your back to your side while in a flat bed without using bedrails?: A Lot Help needed moving from lying on your back to sitting on the side of a flat bed without using bedrails?: A Lot Help needed moving to and from a bed to a chair (including a wheelchair)?: Total Help needed standing up from a chair using your arms (e.g., wheelchair or bedside chair)?: Total Help needed to walk in hospital room?: Total Help needed climbing 3-5 steps with a railing? : Total 6 Click Score: 8    End of Session Equipment Utilized During Treatment: Gait belt Activity Tolerance: Patient tolerated treatment well Patient left: in bed;with nursing/sitter  in room;with call bell/phone within reach;with bed alarm set Nurse Communication: Mobility status PT Visit Diagnosis: Unsteadiness on feet (R26.81);Muscle weakness (generalized) (M62.81);Other abnormalities of gait and mobility (R26.89);Repeated falls (R29.6);Difficulty in walking, not elsewhere classified (R26.2);History of falling (Z91.81);Pain Pain - Right/Left: Left Pain - part of body: Hip    Time: 4196-2229 PT Time Calculation (min) (ACUTE ONLY): 29 min   Charges:   PT Evaluation $PT Eval Moderate Complexity: 1 Mod PT Treatments $Therapeutic Exercise: 8-22 mins $Therapeutic Activity: 8-22 mins       Hilda Lias DPT  Hilda Lias 03/10/2020, 10:20 AM

## 2020-03-10 NOTE — Progress Notes (Signed)
DOS: 03/09/20- L hip long CMN   24H: NAOEON. Pt has moderate pain in hip. No numbness/tingling. No SOB/CP.    LLE: Dressings c/d/i. No drainage. SILT SPN/DPN/T/S/S nerves Motor intact SPN/DPN/T nerves. Palpable DP pulse.     A/P: 59 y/o M POD1 L long CMN for IT fx.  -PT for mobilization -LLE: WBAT.  -Pain control per primary -DVT prophy per primary x 6 wks post op -Dispo: pending pain control and ambulation

## 2020-03-10 NOTE — Progress Notes (Signed)
BP 93/59, MAP 69, HR 97, 02 sats 97% on RA. Patient answering appropriately. Received additional bolus and albumin order from on call provider B. Jon Billings NP.

## 2020-03-11 ENCOUNTER — Encounter: Payer: Self-pay | Admitting: Orthopedic Surgery

## 2020-03-11 DIAGNOSIS — D696 Thrombocytopenia, unspecified: Secondary | ICD-10-CM

## 2020-03-11 DIAGNOSIS — D62 Acute posthemorrhagic anemia: Secondary | ICD-10-CM

## 2020-03-11 LAB — POTASSIUM: Potassium: 4.5 mmol/L (ref 3.5–5.1)

## 2020-03-11 LAB — CBC WITH DIFFERENTIAL/PLATELET
Abs Immature Granulocytes: 0.03 10*3/uL (ref 0.00–0.07)
Basophils Absolute: 0 10*3/uL (ref 0.0–0.1)
Basophils Relative: 0 %
Eosinophils Absolute: 0.4 10*3/uL (ref 0.0–0.5)
Eosinophils Relative: 5 %
HCT: 24.6 % — ABNORMAL LOW (ref 39.0–52.0)
Hemoglobin: 8.1 g/dL — ABNORMAL LOW (ref 13.0–17.0)
Immature Granulocytes: 0 %
Lymphocytes Relative: 16 %
Lymphs Abs: 1.1 10*3/uL (ref 0.7–4.0)
MCH: 33.8 pg (ref 26.0–34.0)
MCHC: 32.9 g/dL (ref 30.0–36.0)
MCV: 102.5 fL — ABNORMAL HIGH (ref 80.0–100.0)
Monocytes Absolute: 0.6 10*3/uL (ref 0.1–1.0)
Monocytes Relative: 9 %
Neutro Abs: 4.9 10*3/uL (ref 1.7–7.7)
Neutrophils Relative %: 70 %
Platelets: 108 10*3/uL — ABNORMAL LOW (ref 150–400)
RBC: 2.4 MIL/uL — ABNORMAL LOW (ref 4.22–5.81)
RDW: 12.2 % (ref 11.5–15.5)
WBC: 7.1 10*3/uL (ref 4.0–10.5)
nRBC: 0 % (ref 0.0–0.2)

## 2020-03-11 LAB — BASIC METABOLIC PANEL
Anion gap: 7 (ref 5–15)
BUN: 9 mg/dL (ref 6–20)
CO2: 22 mmol/L (ref 22–32)
Calcium: 8.1 mg/dL — ABNORMAL LOW (ref 8.9–10.3)
Chloride: 107 mmol/L (ref 98–111)
Creatinine, Ser: 0.69 mg/dL (ref 0.61–1.24)
GFR calc Af Amer: >60 mL/min (ref >60)
GFR calc non Af Amer: >60 mL/min (ref >60)
Glucose, Bld: 148 mg/dL — ABNORMAL HIGH (ref 70–99)
Potassium: 5.5 mmol/L — ABNORMAL HIGH (ref 3.5–5.1)
Sodium: 136 mmol/L (ref 135–145)

## 2020-03-11 LAB — MAGNESIUM: Magnesium: 1.7 mg/dL (ref 1.7–2.4)

## 2020-03-11 MED ORDER — SODIUM POLYSTYRENE SULFONATE 15 GM/60ML PO SUSP
15.0000 g | Freq: Once | ORAL | Status: AC
  Administered 2020-03-11: 15 g via ORAL
  Filled 2020-03-11: qty 60

## 2020-03-11 MED ORDER — LIDOCAINE 5 % EX PTCH
1.0000 | MEDICATED_PATCH | CUTANEOUS | Status: DC
  Administered 2020-03-11 – 2020-03-15 (×5): 1 via TRANSDERMAL
  Filled 2020-03-11 (×5): qty 1

## 2020-03-11 MED ORDER — CHLORDIAZEPOXIDE HCL 5 MG PO CAPS
25.0000 mg | ORAL_CAPSULE | Freq: Two times a day (BID) | ORAL | Status: DC
  Administered 2020-03-11 – 2020-03-15 (×8): 25 mg via ORAL
  Filled 2020-03-11 (×8): qty 5

## 2020-03-11 MED ORDER — OXYCODONE-ACETAMINOPHEN 5-325 MG PO TABS
1.0000 | ORAL_TABLET | ORAL | Status: DC | PRN
  Administered 2020-03-11 – 2020-03-15 (×17): 1 via ORAL
  Filled 2020-03-11 (×17): qty 1
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
## 2020-03-11 NOTE — Progress Notes (Signed)
PROGRESS NOTE    Perry Bullock  YPP:[id number] DOB: 11-Sep-1960 DOA: 03/08/2020 PCP: [facility]   Chief complaint.  Hip pain. Brief Narrative:  Perry Bullock a 59 y.o.malewith medical history significant ofalcohol abuse with recurrent hospitalization, bipolar disorder, anxiety disorder, asthma, previous multiple fractures, COPD, hepatitis C, peripheral vascular disease and degenerative disc disease who has had previous right femoral neck fracture from a fall. He had a fall before admission due to intoxication from alcohol. Sustained a left hip fracture.  9/10.No confusion today. Elevated blood pressure and heart rate due to pain. No evidence of alcohol withdrawal.   Hip surgery with intertrochanteric nail is performed.  9/11.  Blood pressure is low this morning, give 500 mL fluid bolus.  Hemoglobin stable.  9/12.  No evidence of alcohol withdrawal today.  Blood pressure stabilized.   Assessment & Plan:   Principal Problem:   Displaced intertrochanteric fracture of left femur, initial encounter for closed fracture [facility]) Active Problems:   Alcohol abuse   Asthma   Bipolar 1 disorder (HCC)   COPD (chronic obstructive pulmonary disease) (HCC)   Hypotension   Thrombocytopenia (HCC)   Acute blood loss anemia   Hypokalemia   Hypomagnesemia  #1.  Left hip fracture. Status post surgery. Added Percocet for pain control. Continue physical therapy occupational therapy.  2.  Hypotension. Resolved after fluids.  3.  Alcohol abuse. Still has no evidence of withdrawal, decrease Librium to twice a day.  Continue thiamine and folic acid.  Continue CIWA protocol.  4.  Acute blood loss anemia. Hemoglobin still stable.  5.  Thrombocytopenia. Due to alcohol, fracture and acute blood loss.  Platelets count stable.  6.  Hypokalemia and hypomagnesemia. Supplemented.  Has slight hyperkalemia today.  Give a dose of Kayexalate, recheck level by noon.  7.   Bipolar disorder. Continue home medicine.  8.  COPD.   DVT prophylaxis: Eliquis Code Status: Full Family Communication: None    Status is: Inpatient  Remains inpatient appropriate because:Inpatient level of care appropriate due to severity of illness   Dispo: The patient is from: Home              Anticipated d/c is to: SNF              Anticipated d/c date is: 1 day              Patient currently is not medically stable to d/c.        I/O last 3 completed shifts: In: 2114.6 [P.O.:75; I.V.:2039.6] Out: 2025 [Urine:2025] No intake/output data recorded.     Consultants:   Orthopedics.  Procedures: Hip surgery.  Antimicrobials: None.  Subjective: Patient slept well last night.  Denies any short of breath or cough. No abdominal pain or nausea vomiting.  Last bowel movement was Wednesday, patient refused additional laxative, stating that he will have a bowel movement after eating. No fever or chills. No dysuria hematuria.  Objective: Vitals:   03/10/20 0758 03/10/20 1541 03/11/20 0052 03/11/20 0821  BP: (!) 88/55 109/76 119/80 132/85  Pulse: 87 98 93 95  Resp: 19 14 17 17   Temp: 98.7 F (37.1 C) 98.1 F (36.7 C) 98.5 F (36.9 C) 99.2 F (37.3 C)  TempSrc: Oral Oral Oral Oral  SpO2: 95% 97% 98% 95%  Weight:      Height:        Intake/Output Summary (Last 24 hours) at 03/11/2020 0935 Last data filed at 03/11/2020 0700 Gross per  24 hour  Intake 1639.6 ml  Output 1450 ml  Net 189.6 ml   Filed Weights   03/08/20 1513  Weight: 77.1 kg    Examination:  General exam: Appears calm and comfortable  Respiratory system: Clear to auscultation. Respiratory effort normal. Cardiovascular system: S1 & S2 heard, RRR. No JVD, murmurs, rubs, gallops or clicks. No pedal edema. Gastrointestinal system: Abdomen is nondistended, soft and nontender. No organomegaly or masses felt. Normal bowel sounds heard. Central nervous system: Alert and oriented. No focal  neurological deficits. Extremities: Symmetric . Skin: No rashes, lesions or ulcers Psychiatry:  Mood & affect appropriate.     Data Reviewed: I have personally reviewed following labs and imaging studies  CBC: Recent Labs  Lab 03/08/20 1740 03/08/20 2132 03/09/20 0437 03/10/20 0426 03/11/20 0433  WBC 9.0 10.6* 9.0 5.7 7.1  NEUTROABS 6.3  --   --  3.7 4.9  HGB 12.1* 11.9* 12.4* 8.0* 8.1*  HCT 35.2* 36.3* 36.3* 22.8* 24.6*  MCV 98.6 102.0* 102.0* 97.9 102.5*  PLT 199 196 157 107* 108*   Basic Metabolic Panel: Recent Labs  Lab 03/08/20 1740 03/08/20 2132 03/09/20 0649 03/10/20 0426 03/11/20 0433  NA 140 142 140 136 136  K 3.7 3.5 4.2 3.2* 5.5*  CL 105 106 108 106 107  CO2 22 24 23 23 22   GLUCOSE 89 84 123* 110* 148*  BUN 7 7 7 7 9   CREATININE 0.67 0.61 0.61 0.62 0.69  CALCIUM 8.4* 8.2* 8.2* 7.6* 8.1*  MG  --  1.4*  --  1.5* 1.7  PHOS  --  4.4  --   --   --    GFR: Estimated Creatinine Clearance: 105.9 mL/min (by C-G formula based on SCr of 0.69 mg/dL). Liver Function Tests: Recent Labs  Lab 03/08/20 2132 03/09/20 0649  AST 65* 65*  ALT 27 28  ALKPHOS 114 113  BILITOT 1.0 1.6*  PROT 6.6 6.1*  ALBUMIN 3.4* 3.2*   No results for input(s): LIPASE, AMYLASE in the last 168 hours. No results for input(s): AMMONIA in the last 168 hours. Coagulation Profile: Recent Labs  Lab 03/08/20 1740  INR 1.0   Cardiac Enzymes: No results for input(s): CKTOTAL, CKMB, CKMBINDEX, TROPONINI in the last 168 hours. BNP (last 3 results) No results for input(s): PROBNP in the last 8760 hours. HbA1C: No results for input(s): HGBA1C in the last 72 hours. CBG: Recent Labs  Lab 03/09/20 2352  GLUCAP 138*   Lipid Profile: No results for input(s): CHOL, HDL, LDLCALC, TRIG, CHOLHDL, LDLDIRECT in the last 72 hours. Thyroid Function Tests: No results for input(s): TSH, T4TOTAL, FREET4, T3FREE, THYROIDAB in the last 72 hours. Anemia Panel: No results for input(s): VITAMINB12,  FOLATE, FERRITIN, TIBC, IRON, RETICCTPCT in the last 72 hours. Sepsis Labs: No results for input(s): PROCALCITON, LATICACIDVEN in the last 168 hours.  Recent Results (from the past 240 hour(s))  SARS Coronavirus 2 by RT PCR (hospital order, performed in [facility] hospital lab) Nasopharyngeal Nasopharyngeal Swab     Status: None   Collection Time: 03/08/20  4:47 PM   Specimen: Nasopharyngeal Swab  Result Value Ref Range Status   SARS Coronavirus 2 NEGATIVE NEGATIVE Final    Comment: (NOTE) SARS-CoV-2 target nucleic acids are NOT DETECTED.  The SARS-CoV-2 RNA is generally detectable in upper and lower respiratory specimens during the acute phase of infection. The lowest concentration of SARS-CoV-2 viral copies this assay can detect is 250 copies / mL. A negative result does not  preclude SARS-CoV-2 infection and should not be used as the sole basis for treatment or other patient management decisions.  A negative result may occur with improper specimen collection / handling, submission of specimen other than nasopharyngeal swab, presence of viral mutation(s) within the areas targeted by this assay, and inadequate number of viral copies (<250 copies / mL). A negative result must be combined with clinical observations, patient history, and epidemiological information.  Fact Sheet for Patients:   BoilerBrush.com.cyhttps://www.fda.gov/media/136312/download  Fact Sheet for Healthcare Providers: https://pope.com/https://www.fda.gov/media/136313/download  This test is not yet approved or  cleared by the [place] FDA and has been authorized for detection and/or diagnosis of SARS-CoV-2 by FDA under an Emergency Use Authorization (EUA).  This EUA will remain in effect (meaning this test can be used) for the duration of the COVID-19 declaration under Section 564(b)(1) of the Act, 21 U.S.C. section 360bbb-3(b)(1), unless the authorization is terminated or revoked sooner.  Performed at [facility], [address] [place], [place] [address]          Radiology Studies: DG HIP OPERATIVE UNILAT W OR W/O PELVIS LEFT  Result Date: 03/09/2020 CLINICAL DATA:  Intramedullary nail placement EXAM: OPERATIVE LEFT HIP (WITH PELVIS IF PERFORMED) 4 VIEWS TECHNIQUE: Fluoroscopic spot image(s) were submitted for interpretation post-operatively. COMPARISON:  03/08/2020 FINDINGS: The patient has undergone intramedullary nail placement for the proximal left femur fracture. The alignment is improved. The hardware is intact. There are expected postsurgical changes. IMPRESSION: Status post ORIF of the left femur. Electronically Signed   By: Katherine Mantlehristopher  Green M.D.   On: 03/09/2020 18:51   DG FEMUR PORT MIN 2 VIEWS LEFT  Result Date: 03/09/2020 CLINICAL DATA:  Hip fracture. EXAM: LEFT FEMUR PORTABLE 2 VIEWS COMPARISON:  03/08/2020 FINDINGS: The patient has undergone intramedullary nail placement for the known proximal femur fracture on the left. There are expected postsurgical changes. The alignment is improved. The hardware is intact. Vascular calcifications are noted. IMPRESSION: Expected postsurgical changes related to ORIF of the left femur. Electronically Signed   By: Katherine Mantlehristopher  Green M.D.   On: 03/09/2020 18:44        Scheduled Meds: . allopurinol  100 mg Oral Daily  . apixaban  2.5 mg Oral BID  . chlordiazePOXIDE  25 mg Oral TID  . Chlorhexidine Gluconate Cloth  6 each Topical Daily  . docusate sodium  100 mg Oral BID  . fluticasone  2 spray Each Nare Daily  . folic acid  1 mg Oral Daily  . gabapentin  600 mg Oral TID  . lidocaine  1 patch Transdermal Q24H  . loratadine  10 mg Oral Daily  . mometasone-formoterol  2 puff Inhalation BID  . multivitamin with minerals  1 tablet Oral Daily  . pantoprazole  40 mg Oral Daily  . senna  1 tablet Oral BID  . thiamine  100 mg Oral Daily   Or  . thiamine  100 mg Intravenous Daily  . tiotropium  18 mcg Inhalation Daily  . traMADol  50 mg Oral Q6H    Continuous Infusions: . lactated ringers 100 mL/hr at 03/10/20 1045     LOS: 3 days    Time spent: 28 minutes    Marrion Coyekui Klaire Court, MD [facility]   To contact the attending provider between 7A-7P or the covering provider during after hours 7P-7A, please log into the web site [email or web address] and access using universal [place] password for that web site. If you do not have the password, please  call the hospital operator.  03/11/2020, 9:35 AM

## 2020-03-11 NOTE — Progress Notes (Signed)
OT Cancellation Note  Patient Details Name: Perry Bullock MRN: [id number] DOB: 1960-10-25   Cancelled Treatment:    Reason Eval/Treat Not Completed: Medical issues which prohibited therapy. Chart reviewed - pt noted to have K+ critically high at 5.5; contraindicated for exertional activity at this time. Will continue to follow and initiate services as pt medically appropriate to participate in therapy.    Kathie Dike, M.S. OTR/L  03/11/20, 8:39 AM  ascom [phone]

## 2020-03-11 NOTE — Progress Notes (Addendum)
PT Cancellation Note  Patient Details Name: Perry Bullock MRN: [id number] DOB: 1960-12-24   Cancelled Treatment:    Reason Eval/Treat Not Completed: Medical issues which prohibited therapy   Chart reviewed.  Potassium 5.5 this am.  Kayexalate given but no labs re-drawn at this time. Planned re-draw at noon per MD notes.   Will hold per therapy protocols and continue as appropriate.   Danielle Dess 03/11/2020, 10:55 AM

## 2020-03-12 LAB — BASIC METABOLIC PANEL
Anion gap: 9 (ref 5–15)
BUN: 9 mg/dL (ref 6–20)
CO2: 27 mmol/L (ref 22–32)
Calcium: 8.7 mg/dL — ABNORMAL LOW (ref 8.9–10.3)
Chloride: 101 mmol/L (ref 98–111)
Creatinine, Ser: 0.58 mg/dL — ABNORMAL LOW (ref 0.61–1.24)
GFR calc Af Amer: >60 mL/min (ref >60)
GFR calc non Af Amer: >60 mL/min (ref >60)
Glucose, Bld: 131 mg/dL — ABNORMAL HIGH (ref 70–99)
Potassium: 4.7 mmol/L (ref 3.5–5.1)
Sodium: 137 mmol/L (ref 135–145)

## 2020-03-12 LAB — CBC WITH DIFFERENTIAL/PLATELET
Abs Immature Granulocytes: 0.05 10*3/uL (ref 0.00–0.07)
Basophils Absolute: 0 10*3/uL (ref 0.0–0.1)
Basophils Relative: 0 %
Eosinophils Absolute: 0.6 10*3/uL — ABNORMAL HIGH (ref 0.0–0.5)
Eosinophils Relative: 6 %
HCT: 26.5 % — ABNORMAL LOW (ref 39.0–52.0)
Hemoglobin: 8.7 g/dL — ABNORMAL LOW (ref 13.0–17.0)
Immature Granulocytes: 1 %
Lymphocytes Relative: 17 %
Lymphs Abs: 1.6 10*3/uL (ref 0.7–4.0)
MCH: 33.5 pg (ref 26.0–34.0)
MCHC: 32.8 g/dL (ref 30.0–36.0)
MCV: 101.9 fL — ABNORMAL HIGH (ref 80.0–100.0)
Monocytes Absolute: 0.8 10*3/uL (ref 0.1–1.0)
Monocytes Relative: 9 %
Neutro Abs: 6.3 10*3/uL (ref 1.7–7.7)
Neutrophils Relative %: 67 %
Platelets: 145 10*3/uL — ABNORMAL LOW (ref 150–400)
RBC: 2.6 MIL/uL — ABNORMAL LOW (ref 4.22–5.81)
RDW: 12.3 % (ref 11.5–15.5)
WBC: 9.3 10*3/uL (ref 4.0–10.5)
nRBC: 0 % (ref 0.0–0.2)

## 2020-03-12 LAB — MAGNESIUM: Magnesium: 1.4 mg/dL — ABNORMAL LOW (ref 1.7–2.4)

## 2020-03-12 LAB — IRON AND TIBC
Iron: 29 ug/dL — ABNORMAL LOW (ref 45–182)
Saturation Ratios: 15 % — ABNORMAL LOW (ref 17.9–39.5)
TIBC: 189 ug/dL — ABNORMAL LOW (ref 250–450)
UIBC: 160 ug/dL

## 2020-03-12 LAB — VITAMIN B12: Vitamin B-12: 256 pg/mL (ref 180–914)

## 2020-03-12 MED ORDER — LACTULOSE 10 GM/15ML PO SOLN
20.0000 g | Freq: Once | ORAL | Status: AC
  Administered 2020-03-12: 20 g via ORAL
  Filled 2020-03-12: qty 30

## 2020-03-12 MED ORDER — DICLOFENAC SODIUM 1 % EX GEL
2.0000 g | Freq: Four times a day (QID) | CUTANEOUS | Status: DC
  Administered 2020-03-12 – 2020-03-15 (×12): 2 g via TOPICAL
  Filled 2020-03-12: qty 100

## 2020-03-12 MED ORDER — MAGNESIUM SULFATE 2 GM/50ML IV SOLN
2.0000 g | Freq: Once | INTRAVENOUS | Status: AC
  Administered 2020-03-12: 2 g via INTRAVENOUS
  Filled 2020-03-12: qty 50

## 2020-03-12 NOTE — Progress Notes (Signed)
PROGRESS NOTE    Perry Bullock  OBS:[id number] DOB: [birth year] DOA: 03/08/2020 PCP: [facility]   Chief complaint.  Hip pain. Brief Narrative:  Perry Bullock a 59 y.o.malewith medical history significant ofalcohol abuse with recurrent hospitalization, bipolar disorder, anxiety disorder, asthma, previous multiple fractures, COPD, hepatitis C, peripheral vascular disease and degenerative disc disease who has had previous right femoral neck fracture from a fall. He had a fall before admission due to intoxication from alcohol. Sustained a left hip fracture.  9/10.No confusion today. Elevated blood pressure and heart rate due to pain. No evidence of alcohol withdrawal.Hip surgery with intertrochanteric nail is performed.  9/11.Blood pressure is low this morning, give 500 mL fluid bolus. Hemoglobin stable.  9/12.  No evidence of alcohol withdrawal today.  Blood pressure stabilized.  9/13.  Still medically stable, pending SNF placement.   Assessment & Plan:   Principal Problem:   Displaced intertrochanteric fracture of left femur, initial encounter for closed fracture [facility]) Active Problems:   Alcohol abuse   Asthma   Bipolar 1 disorder (HCC)   COPD (chronic obstructive pulmonary disease) (HCC)   Hypotension   Thrombocytopenia (HCC)   Acute blood loss anemia   Hypokalemia   Hypomagnesemia  #1.  Left hip fracture. Postop day #3. Pain under control. Continue physical therapy, Occupational Therapy.  2.  Alcohol abuse. Still has no evidence of withdrawal.  Continue thiamine folic acid.  3.  Acute blood loss anemia. Hemoglobin is stable, will also check iron B12 level.  4.  Thrombocytopenia. Improved.  5.  Hypokalemia and hypomagnesemia. Continue supplement magnesium.  6.  Bipolar disorder. Continue home medicines.  7.  COPD.  Stable.  8.  Hypotension. Resolved.    DVT prophylaxis: Eliquis Code Status: Full Family  Communication: None .   Status is: Inpatient  Remains inpatient appropriate because:Unsafe d/c plan   Dispo: The patient is from: Home              Anticipated d/c is to: SNF              Anticipated d/c date is: 1 day              Patient currently is medically stable to d/c.        I/O last 3 completed shifts: In: 2947.6 [P.O.:1308; I.V.:1639.6] Out: 4950 [Urine:4950] Total I/O In: -  Out: 675 [Urine:675]     Consultants:   Podiatry  Procedures: Hip surgery.  Antimicrobials: None  Subjective: Patient doing well today, no confusion, no tremor. Leg pain better controlled today. No nausea vomiting abdominal pain.  Still no bowel movement, give a dose of lactulose. No fever or chills.  Objective: Vitals:   03/11/20 0821 03/11/20 1514 03/12/20 0000 03/12/20 0733  BP: 132/85 (!) 136/99 (!) 174/111 (!) 119/93  Pulse: 95 (!) 109 (!) 106 98  Resp: 17 16 17 16   Temp: 99.2 F (37.3 C) 98.7 F (37.1 C) 98.5 F (36.9 C) 98.9 F (37.2 C)  TempSrc: Oral Oral Oral Oral  SpO2: 95% 98% 99% 97%  Weight:      Height:        Intake/Output Summary (Last 24 hours) at 03/12/2020 0932 Last data filed at 03/12/2020 0735 Gross per 24 hour  Intake 1308 ml  Output 4175 ml  Net -2867 ml   Filed Weights   03/08/20 1513  Weight: 77.1 kg    Examination:  General exam: Appears calm and comfortable  Respiratory system: Clear  to auscultation. Respiratory effort normal. Cardiovascular system: S1 & S2 heard, RRR. No JVD, murmurs, rubs, gallops or clicks. No pedal edema. Gastrointestinal system: Abdomen is nondistended, soft and nontender. No organomegaly or masses felt. Normal bowel sounds heard. Central nervous system: Alert and oriented. No focal neurological deficits. Extremities: Symmetric 5 x 5 power. Skin: No rashes, lesions or ulcers Psychiatry: Judgement and insight appear normal. Mood & affect appropriate.     Data Reviewed: I have personally reviewed following  labs and imaging studies  CBC: Recent Labs  Lab 03/08/20 1740 03/08/20 1740 03/08/20 2132 03/09/20 0437 03/10/20 0426 03/11/20 0433 03/12/20 0243  WBC 9.0   < > 10.6* 9.0 5.7 7.1 9.3  NEUTROABS 6.3  --   --   --  3.7 4.9 6.3  HGB 12.1*   < > 11.9* 12.4* 8.0* 8.1* 8.7*  HCT 35.2*   < > 36.3* 36.3* 22.8* 24.6* 26.5*  MCV 98.6   < > 102.0* 102.0* 97.9 102.5* 101.9*  PLT 199   < > 196 157 107* 108* 145*   < > = values in this interval not displayed.   Basic Metabolic Panel: Recent Labs  Lab 03/08/20 2132 03/08/20 2132 03/09/20 0649 03/10/20 0426 03/11/20 0433 03/11/20 1210 03/12/20 0243  NA 142  --  140 136 136  --  137  K 3.5   < > 4.2 3.2* 5.5* 4.5 4.7  CL 106  --  108 106 107  --  101  CO2 24  --  23 23 22   --  27  GLUCOSE 84  --  123* 110* 148*  --  131*  BUN 7  --  7 7 9   --  9  CREATININE 0.61  --  0.61 0.62 0.69  --  0.58*  CALCIUM 8.2*  --  8.2* 7.6* 8.1*  --  8.7*  MG 1.4*  --   --  1.5* 1.7  --  1.4*  PHOS 4.4  --   --   --   --   --   --    < > = values in this interval not displayed.   GFR: Estimated Creatinine Clearance: 105.9 mL/min (A) (by C-G formula based on SCr of 0.58 mg/dL (L)). Liver Function Tests: Recent Labs  Lab 03/08/20 2132 03/09/20 0649  AST 65* 65*  ALT 27 28  ALKPHOS 114 113  BILITOT 1.0 1.6*  PROT 6.6 6.1*  ALBUMIN 3.4* 3.2*   No results for input(s): LIPASE, AMYLASE in the last 168 hours. No results for input(s): AMMONIA in the last 168 hours. Coagulation Profile: Recent Labs  Lab 03/08/20 1740  INR 1.0   Cardiac Enzymes: No results for input(s): CKTOTAL, CKMB, CKMBINDEX, TROPONINI in the last 168 hours. BNP (last 3 results) No results for input(s): PROBNP in the last 8760 hours. HbA1C: No results for input(s): HGBA1C in the last 72 hours. CBG: Recent Labs  Lab 03/09/20 2352  GLUCAP 138*   Lipid Profile: No results for input(s): CHOL, HDL, LDLCALC, TRIG, CHOLHDL, LDLDIRECT in the last 72 hours. Thyroid Function  Tests: No results for input(s): TSH, T4TOTAL, FREET4, T3FREE, THYROIDAB in the last 72 hours. Anemia Panel: No results for input(s): VITAMINB12, FOLATE, FERRITIN, TIBC, IRON, RETICCTPCT in the last 72 hours. Sepsis Labs: No results for input(s): PROCALCITON, LATICACIDVEN in the last 168 hours.  Recent Results (from the past 240 hour(s))  SARS Coronavirus 2 by RT PCR (hospital order, performed in [facility] hospital lab) Nasopharyngeal Nasopharyngeal Swab  Status: None   Collection Time: 03/08/20  4:47 PM   Specimen: Nasopharyngeal Swab  Result Value Ref Range Status   SARS Coronavirus 2 NEGATIVE NEGATIVE Final    Comment: (NOTE) SARS-CoV-2 target nucleic acids are NOT DETECTED.  The SARS-CoV-2 RNA is generally detectable in upper and lower respiratory specimens during the acute phase of infection. The lowest concentration of SARS-CoV-2 viral copies this assay can detect is 250 copies / mL. A negative result does not preclude SARS-CoV-2 infection and should not be used as the sole basis for treatment or other patient management decisions.  A negative result may occur with improper specimen collection / handling, submission of specimen other than nasopharyngeal swab, presence of viral mutation(s) within the areas targeted by this assay, and inadequate number of viral copies (<250 copies / mL). A negative result must be combined with clinical observations, patient history, and epidemiological information.  Fact Sheet for Patients:   BoilerBrush.com.cy  Fact Sheet for Healthcare Providers: https://pope.com/  This test is not yet approved or  cleared by the [place] FDA and has been authorized for detection and/or diagnosis of SARS-CoV-2 by FDA under an Emergency Use Authorization (EUA).  This EUA will remain in effect (meaning this test can be used) for the duration of the COVID-19 declaration under Section 564(b)(1) of the  Act, 21 U.S.C. section 360bbb-3(b)(1), unless the authorization is terminated or revoked sooner.  Performed at [facility], [address], [place], [place] [address]          Radiology Studies: No results found.      Scheduled Meds: . allopurinol  100 mg Oral Daily  . apixaban  2.5 mg Oral BID  . chlordiazePOXIDE  25 mg Oral BID  . Chlorhexidine Gluconate Cloth  6 each Topical Daily  . diclofenac Sodium  2 g Topical QID  . docusate sodium  100 mg Oral BID  . fluticasone  2 spray Each Nare Daily  . folic acid  1 mg Oral Daily  . gabapentin  600 mg Oral TID  . lidocaine  1 patch Transdermal Q24H  . loratadine  10 mg Oral Daily  . mometasone-formoterol  2 puff Inhalation BID  . multivitamin with minerals  1 tablet Oral Daily  . pantoprazole  40 mg Oral Daily  . senna  1 tablet Oral BID  . thiamine  100 mg Oral Daily   Or  . thiamine  100 mg Intravenous Daily  . tiotropium  18 mcg Inhalation Daily  . traMADol  50 mg Oral Q6H   Continuous Infusions: . magnesium sulfate bolus IVPB 2 g (03/12/20 0846)     LOS: 4 days    Time spent: 27 minutes    Marrion Coy, MD [facility]   To contact the attending provider between 7A-7P or the covering provider during after hours 7P-7A, please log into the web site [email or web address] and access using universal [place] password for that web site. If you do not have the password, please call the hospital operator.  03/12/2020, 9:32 AM

## 2020-03-12 NOTE — Progress Notes (Signed)
Physical Therapy Treatment Patient Details Name: Perry Bullock MRN: [id number] DOB: 19-Aug-1960 Today's Date: 03/12/2020    History of Present Illness Perry Bullock is a 59 y.o. male with IM fixation of L intertrochanteric hip fx following fall. Past medical history significant of alcohol abuse with recurrent hospitalization, bipolar disorder, anxiety disorder, asthma, previous multiple fractures, COPD, hepatitis C, peripheral vascular disease and degenerative disc disease who has had previous right femoral neck fracture from a fall. Has been using a hurry-cane since multiple trauma fall in 2017.    PT Comments    Pt back in bed with nursing assist. Tolerated OOB about 3 1/2 hours today.  Had recently gotten back in bed but agreed to ex.  Participated in exercises as described below.  Girlfriend in room.  Agreed that SNF is best upon discharge given mobility and frequent falls.  Pt agrees.  Will continue with SNF recommendation.   Follow Up Recommendations  SNF     Equipment Recommendations  Rolling walker with 5" wheels;3in1 (PT)    Recommendations for Other Services       Precautions / Restrictions Precautions Precautions: Fall Restrictions Weight Bearing Restrictions: Yes LLE Weight Bearing: Weight bearing as tolerated    Mobility  Bed Mobility Overal bed mobility: Needs Assistance Bed Mobility: Supine to Sit     Supine to sit: Min assist;+2 for physical assistance        Transfers Overall transfer level: Needs assistance Equipment used: Rolling walker (2 wheeled) Transfers: Sit to/from Stand Sit to Stand: Min assist;Mod assist;+2 physical assistance            Ambulation/Gait Ambulation/Gait assistance: Min assist;+2 physical assistance Gait Distance (Feet): 3 Feet Assistive device: Rolling walker (2 wheeled) Gait Pattern/deviations: Step-to pattern Gait velocity: decreased   General Gait Details: sidestepped along bed and to chair   Stairs              Wheelchair Mobility    Modified Rankin (Stroke Patients Only)       Balance Overall balance assessment: Needs assistance Sitting-balance support: Feet supported Sitting balance-Leahy Scale: Good     Standing balance support: Bilateral upper extremity supported Standing balance-Leahy Scale: Poor Standing balance comment: relies heavily on walker and general assist for safety                            Cognition Arousal/Alertness: Awake/alert Behavior During Therapy: WFL for tasks assessed/performed Overall Cognitive Status: Within Functional Limits for tasks assessed                                        Exercises Other Exercises Other Exercises: BLE AAROM ankle pumps, quad and glut sets, SLR, SAQ, heel slides and ab/add x 10    General Comments        Pertinent Vitals/Pain Pain Assessment: Faces Pain Score: 8  Faces Pain Scale: Hurts little more Pain Location: L hip Pain Descriptors / Indicators: Aching Pain Intervention(s): Limited activity within patient's tolerance;Ice applied    Home Living Family/patient expects to be discharged to:: Private residence Living Arrangements: Spouse/significant other Available Help at Discharge: Family Type of Home: House Home Access: Stairs to enter Entrance Stairs-Rails: Can reach both Home Layout: One level Home Equipment: Environmental consultant - 2 wheels;Bedside commode;Tub bench      Prior Function Level of Independence: Independent  Comments: Using hurrycane, significant other of 20 years endorses multiple falls from alcohol abuse, manages his own meds; does not drive  - spouse/gf drives   PT Goals (current goals can now be found in the care plan section) Acute Rehab PT Goals Patient Stated Goal: go home Progress towards PT goals: Progressing toward goals    Frequency    BID      PT Plan Current plan remains appropriate    Co-evaluation PT/OT/SLP Co-Evaluation/Treatment:  Yes Reason for Co-Treatment: Complexity of the patient's impairments (multi-system involvement);For patient/therapist safety;To address functional/ADL transfers PT goals addressed during session: Mobility/safety with mobility;Strengthening/ROM OT goals addressed during session: Proper use of Adaptive equipment and DME;ADL's and self-care      AM-PAC PT "6 Clicks" Mobility   Outcome Measure  Help needed turning from your back to your side while in a flat bed without using bedrails?: A Little Help needed moving from lying on your back to sitting on the side of a flat bed without using bedrails?: A Little Help needed moving to and from a bed to a chair (including a wheelchair)?: A Little Help needed standing up from a chair using your arms (e.g., wheelchair or bedside chair)?: A Lot Help needed to walk in hospital room?: A Lot Help needed climbing 3-5 steps with a railing? : A Lot 6 Click Score: 15    End of Session Equipment Utilized During Treatment: Gait belt Activity Tolerance: Patient tolerated treatment well Patient left: in bed;with call bell/phone within reach;with bed alarm set;with family/visitor present Nurse Communication: Mobility status Pain - Right/Left: Left Pain - part of body: Hip     Time: 2563-8937 PT Time Calculation (min) (ACUTE ONLY): 23 min  Charges:  $Therapeutic Exercise: 8-22 mins $Therapeutic Activity: 8-22 mins                    Danielle Dess, PTA 03/12/20, 1:11 PM

## 2020-03-12 NOTE — Evaluation (Signed)
Occupational Therapy Evaluation Patient Details Name: Perry Bullock MRN: [id number] DOB: 04/06/1961 Today's Date: 03/12/2020    History of Present Illness Perry Bullock is a 59 y.o. male with IM fixation of L intertrochanteric hip fx following fall. Past medical history significant of alcohol abuse with recurrent hospitalization, bipolar disorder, anxiety disorder, asthma, previous multiple fractures, COPD, hepatitis C, peripheral vascular disease and degenerative disc disease who has had previous right femoral neck fracture from a fall. Has been using a hurry-cane since multiple trauma fall in 2017.   Clinical Impression   Pt seen for OT/PT co-tx and OT evaluation this date. Pt agreeable to therapy, reports 8-9/10 pain at rest. Pt lives with his girlfriend of 20+ years in a home with 3 steps to enter. Pt uses a "hurry-cane" for mobility and endorses multiple falls. Pt performed bed mobility and ADL transfers with RW with cues for hand/foot placement to improve technique, +2 for assist. Pt up to recliner with encouragement. Pt instructed in assist for LB ADL and pt reports his girlfriend will be able to provide needed level of assist. Pt will benefit from skilled OT services to maximize return to PLOF and minimize risk of future falls, readmission, and increased caregiver burden. Recommend SNF at this time. Will continue to assess as pt progresses.      Follow Up Recommendations  SNF    Equipment Recommendations  3 in 1 bedside commode    Recommendations for Other Services       Precautions / Restrictions Precautions Precautions: Fall Restrictions Weight Bearing Restrictions: Yes LLE Weight Bearing: Weight bearing as tolerated      Mobility Bed Mobility Overal bed mobility: Needs Assistance Bed Mobility: Supine to Sit     Supine to sit: Min assist;+2 for physical assistance        Transfers Overall transfer level: Needs assistance Equipment used: Rolling walker (2  wheeled) Transfers: Sit to/from Stand Sit to Stand: Min assist;Mod assist;+2 physical assistance              Balance Overall balance assessment: Needs assistance Sitting-balance support: Feet supported Sitting balance-Leahy Scale: Good     Standing balance support: Bilateral upper extremity supported Standing balance-Leahy Scale: Poor Standing balance comment: relies heavily on walker and general assist for safety                           ADL either performed or assessed with clinical judgement   ADL Overall ADL's : Needs assistance/impaired                                       General ADL Comments: Mod A for LB ADL tasks 2/2 impairments; +2 for ADL transfers     Vision Baseline Vision/History:  (blind in R eye) Patient Visual Report: No change from baseline       Perception     Praxis      Pertinent Vitals/Pain Pain Assessment: 0-10 Pain Score: 8  Faces Pain Scale: Hurts even more Pain Location: L hip Pain Descriptors / Indicators: Aching Pain Intervention(s): Limited activity within patient's tolerance;Monitored during session;Repositioned     Hand Dominance Right   Extremity/Trunk Assessment Upper Extremity Assessment Upper Extremity Assessment: Overall WFL for tasks assessed   Lower Extremity Assessment Lower Extremity Assessment: Generalized weakness;LLE deficits/detail LLE Deficits / Details: expected post-op strength/ROM deficits   Cervical /  Trunk Assessment Cervical / Trunk Assessment: Normal   Communication Communication Communication: No difficulties   Cognition Arousal/Alertness: Awake/alert Behavior During Therapy: WFL for tasks assessed/performed Overall Cognitive Status: Within Functional Limits for tasks assessed                                     General Comments       Exercises Other Exercises Other Exercises: ankle pumps and quad sets in supine, LAQ in sitting and hip and knee  flex/SLR x 5 in standing.   Shoulder Instructions      Home Living Family/patient expects to be discharged to:: Private residence Living Arrangements: Spouse/significant other Available Help at Discharge: Family Type of Home: House Home Access: Stairs to enter Secretary/administrator of Steps: 3 Entrance Stairs-Rails: Can reach both Home Layout: One level     Bathroom Shower/Tub: Tub/shower unit         Home Equipment: Environmental consultant - 2 wheels;Bedside commode;Tub bench          Prior Functioning/Environment Level of Independence: Independent        Comments: Using hurrycane, significant other of 20 years endorses multiple falls from alcohol abuse, manages his own meds; does not drive  - spouse/gf drives        OT Problem List: Decreased strength;Pain;Decreased safety awareness;Decreased activity tolerance;Impaired balance (sitting and/or standing);Decreased knowledge of use of DME or AE;Impaired vision/perception      OT Treatment/Interventions: Self-care/ADL training;Therapeutic exercise;Therapeutic activities;DME and/or AE instruction;Patient/family education;Balance training    OT Goals(Current goals can be found in the care plan section) Acute Rehab OT Goals Patient Stated Goal: go home OT Goal Formulation: With patient Time For Goal Achievement: 03/26/20 Potential to Achieve Goals: Good ADL Goals Pt Will Perform Lower Body Dressing: sit to/from stand;with adaptive equipment;with min guard assist Pt Will Transfer to Toilet: with min assist;ambulating;bedside commode (LRAD for amb) Additional ADL Goal #1: Pt will verbalize plan to implement at least 2 learned falls prevention strategies.  OT Frequency: Min 1X/week   Barriers to D/C: Inaccessible home environment          Co-evaluation PT/OT/SLP Co-Evaluation/Treatment: Yes Reason for Co-Treatment: Complexity of the patient's impairments (multi-system involvement);For patient/therapist safety;To address  functional/ADL transfers PT goals addressed during session: Mobility/safety with mobility;Strengthening/ROM OT goals addressed during session: Proper use of Adaptive equipment and DME;ADL's and self-care      AM-PAC OT "6 Clicks" Daily Activity     Outcome Measure Help from another person eating meals?: None Help from another person taking care of personal grooming?: None Help from another person toileting, which includes using toliet, bedpan, or urinal?: A Little Help from another person bathing (including washing, rinsing, drying)?: A Lot Help from another person to put on and taking off regular upper body clothing?: A Little Help from another person to put on and taking off regular lower body clothing?: A Lot 6 Click Score: 18   End of Session Equipment Utilized During Treatment: Gait belt;Rolling walker Nurse Communication: Other (comment) (need chair alarm box to connect chair pad to)  Activity Tolerance: Patient tolerated treatment well Patient left: in chair;with call bell/phone within reach;with SCD's reapplied (unit Quarry manager for chair alarm box; chair pad in place)  OT Visit Diagnosis: Other abnormalities of gait and mobility (R26.89);Repeated falls (R29.6);Muscle weakness (generalized) (M62.81);Pain Pain - Right/Left: Left Pain - part of body: Hip  Time: 9163-8466 OT Time Calculation (min): 20 min Charges:  OT General Charges $OT Visit: 1 Visit OT Evaluation $OT Eval Moderate Complexity: 1 Mod  Richrd Prime, MPH, MS, OTR/L ascom [phone] 03/12/20, 10:27 AM

## 2020-03-12 NOTE — Progress Notes (Signed)
Subjective:  Patient reports pain as mild.    Objective:   VITALS:   Vitals:   03/11/20 0821 03/11/20 1514 03/12/20 0000 03/12/20 0733  BP: 132/85 (!) 136/99 (!) 174/111 (!) 119/93  Pulse: 95 (!) 109 (!) 106 98  Resp: 17 16 17 16   Temp: 99.2 F (37.3 C) 98.7 F (37.1 C) 98.5 F (36.9 C) 98.9 F (37.2 C)  TempSrc: Oral Oral Oral Oral  SpO2: 95% 98% 99% 97%  Weight:      Height:        PHYSICAL EXAM:  Neurologically intact ABD soft Neurovascular intact Sensation intact distally Intact pulses distally Dorsiflexion/Plantar flexion intact Incision: dressing C/D/I No cellulitis present Compartment soft  LABS  Results for orders placed or performed during the hospital encounter of 03/08/20 (from the past 24 hour(s))  CBC with Differential/Platelet     Status: Abnormal   Collection Time: 03/12/20  2:43 AM  Result Value Ref Range   WBC 9.3 4.0 - 10.5 K/uL   RBC 2.60 (L) 4.22 - 5.81 MIL/uL   Hemoglobin 8.7 (L) 13.0 - 17.0 g/dL   HCT 03/14/20 (L) 39 - 52 %   MCV 101.9 (H) 80.0 - 100.0 fL   MCH 33.5 26.0 - 34.0 pg   MCHC 32.8 30.0 - 36.0 g/dL   RDW 95.6 21.3 - 08.6 %   Platelets 145 (L) 150 - 400 K/uL   nRBC 0.0 0.0 - 0.2 %   Neutrophils Relative % 67 %   Neutro Abs 6.3 1.7 - 7.7 K/uL   Lymphocytes Relative 17 %   Lymphs Abs 1.6 0.7 - 4.0 K/uL   Monocytes Relative 9 %   Monocytes Absolute 0.8 0 - 1 K/uL   Eosinophils Relative 6 %   Eosinophils Absolute 0.6 (H) 0 - 0 K/uL   Basophils Relative 0 %   Basophils Absolute 0.0 0 - 0 K/uL   Immature Granulocytes 1 %   Abs Immature Granulocytes 0.05 0.00 - 0.07 K/uL  Basic metabolic panel     Status: Abnormal   Collection Time: 03/12/20  2:43 AM  Result Value Ref Range   Sodium 137 135 - 145 mmol/L   Potassium 4.7 3.5 - 5.1 mmol/L   Chloride 101 98 - 111 mmol/L   CO2 27 22 - 32 mmol/L   Glucose, Bld 131 (H) 70 - 99 mg/dL   BUN 9 6 - 20 mg/dL   Creatinine, Ser 03/14/20 (L) 0.61 - 1.24 mg/dL   Calcium 8.7 (L) 8.9 - 10.3  mg/dL   GFR calc non Af Amer >60 >60 mL/min   GFR calc Af Amer >60 >60 mL/min   Anion gap 9 5 - 15  Magnesium     Status: Abnormal   Collection Time: 03/12/20  2:43 AM  Result Value Ref Range   Magnesium 1.4 (L) 1.7 - 2.4 mg/dL  Iron and TIBC     Status: Abnormal   Collection Time: 03/12/20  2:43 AM  Result Value Ref Range   Iron 29 (L) 45 - 182 ug/dL   TIBC 03/14/20 (L) 629 - 528 ug/dL   Saturation Ratios 15 (L) 17.9 - 39.5 %   UIBC 160 ug/dL    No results found.  Assessment/Plan: 3 Days Post-Op   Principal Problem:   Displaced intertrochanteric fracture of left femur, initial encounter for closed fracture [facility]) Active Problems:   Alcohol abuse   Asthma   Bipolar 1 disorder (HCC)   COPD (chronic obstructive pulmonary disease) (HCC)  Hypotension   Thrombocytopenia (HCC)   Acute blood loss anemia   Hypokalemia   Hypomagnesemia   Advance diet Up with therapy A/P: 59 y/o M POD1 L long CMN for IT fx.  -PT for mobilization -LLE: WBAT.  -Pain control per primary -DVT prophy per primary x 6 wks post op -Dispo: pending pain control and ambulation  Altamese Cabal , PA-C 03/12/2020, 1:05 PM

## 2020-03-12 NOTE — Progress Notes (Addendum)
Physical Therapy Treatment Patient Details Name: Perry Bullock MRN: [id number] DOB: 1961-01-10 Today's Date: 03/12/2020    History of Present Illness Perry Bullock is a 59 y.o. male with IM fixation of L intertrochanteric hip fx following fall. Past medical history significant of alcohol abuse with recurrent hospitalization, bipolar disorder, anxiety disorder, asthma, previous multiple fractures, COPD, hepatitis C, peripheral vascular disease and degenerative disc disease who has had previous right femoral neck fracture from a fall. Has been using a hurry-cane since multiple trauma fall in 2017.    PT Comments    Pt in bed, ready for session.  Participated in exercises as described below.  He was able to stand x 2 with min/mod a x 1.  On first attempt he is able to sidestep along EOB and on second transfer to chair.  He does report some dizziness 5/10 but relived with time.  He remained in recliner after session.    Discussed discharge plan.  Pt wishes to go home.  Girlfriend is CNA and feels she can care for him.  He is resistant to SNF.  He will need RW and 3-in 1 for discharge if he chooses to go home.  He does have 3 steps to enter his home.  Will continue to address mobility and discharge planning.  SNF remains most appropriate at this time.  Co-tx with OT -- eval for OT, treatment for PT session.   Follow Up Recommendations  SNF     Equipment Recommendations  Rolling walker with 5" wheels;3in1 (PT)    Recommendations for Other Services       Precautions / Restrictions Precautions Precautions: Fall Restrictions Weight Bearing Restrictions: Yes LLE Weight Bearing: Weight bearing as tolerated    Mobility  Bed Mobility Overal bed mobility: Needs Assistance Bed Mobility: Supine to Sit     Supine to sit: Min assist;+2 for physical assistance        Transfers Overall transfer level: Needs assistance Equipment used: Rolling walker (2 wheeled) Transfers: Sit to/from  Stand Sit to Stand: Min assist;Mod assist;+2 physical assistance            Ambulation/Gait Ambulation/Gait assistance: Min assist;+2 physical assistance Gait Distance (Feet): 3 Feet Assistive device: Rolling walker (2 wheeled) Gait Pattern/deviations: Step-to pattern Gait velocity: decreased   General Gait Details: sidestepped along bed and to chair   Stairs             Wheelchair Mobility    Modified Rankin (Stroke Patients Only)       Balance Overall balance assessment: Needs assistance Sitting-balance support: Feet supported Sitting balance-Leahy Scale: Good     Standing balance support: Bilateral upper extremity supported Standing balance-Leahy Scale: Poor Standing balance comment: relies heavily on walker and general assist for safety                            Cognition Arousal/Alertness: Awake/alert Behavior During Therapy: WFL for tasks assessed/performed Overall Cognitive Status: Within Functional Limits for tasks assessed                                        Exercises Other Exercises Other Exercises: ankle pumps and quad sets in supine, LAQ in sitting and hip and knee flex/SLR x 5 in standing.    General Comments        Pertinent Vitals/Pain Pain Assessment:  Faces Faces Pain Scale: Hurts even more Pain Location: L hip Pain Descriptors / Indicators: Aching Pain Intervention(s): Limited activity within patient's tolerance;Monitored during session;Repositioned    Home Living                      Prior Function            PT Goals (current goals can now be found in the care plan section) Progress towards PT goals: Progressing toward goals    Frequency           PT Plan Current plan remains appropriate    Co-evaluation PT/OT/SLP Co-Evaluation/Treatment: Yes Reason for Co-Treatment: Complexity of the patient's impairments (multi-system involvement) PT goals addressed during session:  Mobility/safety with mobility;Strengthening/ROM OT goals addressed during session: Other (comment)      AM-PAC PT "6 Clicks" Mobility   Outcome Measure  Help needed turning from your back to your side while in a flat bed without using bedrails?: A Lot Help needed moving from lying on your back to sitting on the side of a flat bed without using bedrails?: A Lot Help needed moving to and from a bed to a chair (including a wheelchair)?: Total Help needed standing up from a chair using your arms (e.g., wheelchair or bedside chair)?: Total Help needed to walk in hospital room?: Total Help needed climbing 3-5 steps with a railing? : Total 6 Click Score: 8    End of Session Equipment Utilized During Treatment: Gait belt Activity Tolerance: Patient tolerated treatment well Patient left: in chair;with call bell/phone within reach (RN to obtain missing chair alarm in room) Nurse Communication: Mobility status Pain - Right/Left: Left Pain - part of body: Hip     Time: 3557-3220 PT Time Calculation (min) (ACUTE ONLY): 23 min  Charges:  $Therapeutic Exercise: 8-22 mins $Therapeutic Activity: 8-22 mins                    Danielle Dess, PTA 03/12/20, 9:50 AM

## 2020-03-13 ENCOUNTER — Inpatient Hospital Stay: Payer: Medicaid Other

## 2020-03-13 LAB — BASIC METABOLIC PANEL
Anion gap: 9 (ref 5–15)
BUN: 12 mg/dL (ref 6–20)
CO2: 28 mmol/L (ref 22–32)
Calcium: 9.2 mg/dL (ref 8.9–10.3)
Chloride: 102 mmol/L (ref 98–111)
Creatinine, Ser: 0.79 mg/dL (ref 0.61–1.24)
GFR calc Af Amer: >60 mL/min (ref >60)
GFR calc non Af Amer: >60 mL/min (ref >60)
Glucose, Bld: 140 mg/dL — ABNORMAL HIGH (ref 70–99)
Potassium: 4.9 mmol/L (ref 3.5–5.1)
Sodium: 139 mmol/L (ref 135–145)

## 2020-03-13 LAB — CBC WITH DIFFERENTIAL/PLATELET
Abs Immature Granulocytes: 0.07 10*3/uL (ref 0.00–0.07)
Basophils Absolute: 0.1 10*3/uL (ref 0.0–0.1)
Basophils Relative: 0 %
Eosinophils Absolute: 0.2 10*3/uL (ref 0.0–0.5)
Eosinophils Relative: 1 %
HCT: 26 % — ABNORMAL LOW (ref 39.0–52.0)
Hemoglobin: 8.6 g/dL — ABNORMAL LOW (ref 13.0–17.0)
Immature Granulocytes: 0 %
Lymphocytes Relative: 8 %
Lymphs Abs: 1.5 10*3/uL (ref 0.7–4.0)
MCH: 34.3 pg — ABNORMAL HIGH (ref 26.0–34.0)
MCHC: 33.1 g/dL (ref 30.0–36.0)
MCV: 103.6 fL — ABNORMAL HIGH (ref 80.0–100.0)
Monocytes Absolute: 1.7 10*3/uL — ABNORMAL HIGH (ref 0.1–1.0)
Monocytes Relative: 9 %
Neutro Abs: 14.8 10*3/uL — ABNORMAL HIGH (ref 1.7–7.7)
Neutrophils Relative %: 82 %
Platelets: 172 10*3/uL (ref 150–400)
RBC: 2.51 MIL/uL — ABNORMAL LOW (ref 4.22–5.81)
RDW: 12.9 % (ref 11.5–15.5)
WBC: 18.1 10*3/uL — ABNORMAL HIGH (ref 4.0–10.5)
nRBC: 0 % (ref 0.0–0.2)

## 2020-03-13 LAB — MAGNESIUM: Magnesium: 1.8 mg/dL (ref 1.7–2.4)

## 2020-03-13 MED ORDER — POLYSACCHARIDE IRON COMPLEX 150 MG PO CAPS
150.0000 mg | ORAL_CAPSULE | Freq: Every day | ORAL | Status: DC
  Administered 2020-03-13 – 2020-03-15 (×3): 150 mg via ORAL
  Filled 2020-03-13 (×3): qty 1

## 2020-03-13 MED ORDER — AMOXICILLIN-POT CLAVULANATE 875-125 MG PO TABS
1.0000 | ORAL_TABLET | Freq: Two times a day (BID) | ORAL | Status: DC
  Administered 2020-03-13 – 2020-03-15 (×5): 1 via ORAL
  Filled 2020-03-13 (×5): qty 1

## 2020-03-13 NOTE — Progress Notes (Signed)
Pt Back in bed. Is alert and orientated x4. Uses urinal and BSC. Is WBAT with standby assist and walker. Pulse is elevated at times. Pt c/o pain in left hip. Gave prn pain medication and assist pt pulse. Pulse did come down but still elevated. On call Manuela Schwartz NP notified of Pulse no new orders received. Will continue to monitor.

## 2020-03-13 NOTE — Progress Notes (Signed)
Physical Therapy Treatment Patient Details Name: Perry Bullock MRN: [id number] DOB: 1960/07/26 Today's Date: 03/13/2020    History of Present Illness Perry Bullock is a 59 y.o. male with IM fixation of L intertrochanteric hip fx following fall. Past medical history significant of alcohol abuse with recurrent hospitalization, bipolar disorder, anxiety disorder, asthma, previous multiple fractures, COPD, hepatitis C, peripheral vascular disease and degenerative disc disease who has had previous right femoral neck fracture from a fall. Has been using a hurry-cane since multiple trauma fall in 2017.    PT Comments    Pt showed increased strength, mobility and tolerance regarding pain this date.  He was able to ambulate >50 ft in the room and though he had some increased HR (up to 130) he did not feel excessive fatigue and though he had pain he was not overtly limited by it.  Pt showed good effort with exercises and per chart review seems to have had his best PT session by a wide margin today.    Follow Up Recommendations  SNF     Equipment Recommendations  Rolling walker with 5" wheels;3in1 (PT)    Recommendations for Other Services       Precautions / Restrictions Precautions Precautions: Fall Restrictions Weight Bearing Restrictions: Yes LLE Weight Bearing: Weight bearing as tolerated    Mobility  Bed Mobility Overal bed mobility: Needs Assistance Bed Mobility: Supine to Sit Rolling: Min assist   Supine to sit: Min assist     General bed mobility comments: Pt did not need excessive assist with getting LEs to EOB (he used his UEs some and PT provided light assist to east L over edge).  Pt was able to leverage himself to upright w/o phyiscal assist  Transfers Overall transfer level: Needs assistance Equipment used: Rolling walker (2 wheeled) Transfers: Sit to/from Stand Sit to Stand: Min assist         General transfer comment: Elevated bed height to assist, pt  ultimately did well and showed good overall effort  Ambulation/Gait Ambulation/Gait assistance: Min guard Gait Distance (Feet): 55 Feet Assistive device: Rolling walker (2 wheeled)       General Gait Details: Pt was able to turn and do a few small loops in the room with good relative safety and only needing cuing and CGA.  He did report expected pain and had HR increase to 130bpm but actually was able to maintain appropriate safety/walker use, O2 sats >92 and despite some increased pain reports feeling as good as he has yet with mobility.   Stairs             Wheelchair Mobility    Modified Rankin (Stroke Patients Only)       Balance Overall balance assessment: Needs assistance Sitting-balance support: Feet supported Sitting balance-Leahy Scale: Good     Standing balance support: Bilateral upper extremity supported Standing balance-Leahy Scale: Good Standing balance comment: Pt reliant on the walker, but did not have any stagger steps/LOBs/or overt safety issues                            Cognition Arousal/Alertness: Awake/alert Behavior During Therapy: WFL for tasks assessed/performed Overall Cognitive Status: Within Functional Limits for tasks assessed                                        Exercises General  Exercises - Lower Extremity Ankle Circles/Pumps: AROM;15 reps Quad Sets: Strengthening;10 reps Short Arc Quad: Strengthening;10 reps Heel Slides: 10 reps;AROM (with resisted leg extenions pt self UE assist on a few rep s) Hip ABduction/ADduction: AAROM;10 reps;Strengthening Straight Leg Raises: PROM (unable to perform w/o considerable pain)    General Comments        Pertinent Vitals/Pain Pain Assessment: 0-10 Pain Score: 8  Pain Location: L hip    Home Living                      Prior Function            PT Goals (current goals can now be found in the care plan section) Progress towards PT goals:  Progressing toward goals    Frequency    BID      PT Plan Current plan remains appropriate    Co-evaluation              AM-PAC PT "6 Clicks" Mobility   Outcome Measure  Help needed turning from your back to your side while in a flat bed without using bedrails?: A Little Help needed moving from lying on your back to sitting on the side of a flat bed without using bedrails?: A Little Help needed moving to and from a bed to a chair (including a wheelchair)?: A Little Help needed standing up from a chair using your arms (e.g., wheelchair or bedside chair)?: A Lot Help needed to walk in hospital room?: A Lot Help needed climbing 3-5 steps with a railing? : A Lot 6 Click Score: 15    End of Session Equipment Utilized During Treatment: Gait belt Activity Tolerance: Patient tolerated treatment well Patient left: in chair;with call bell/phone within reach   PT Visit Diagnosis: Unsteadiness on feet (R26.81);Muscle weakness (generalized) (M62.81);Other abnormalities of gait and mobility (R26.89);Repeated falls (R29.6);Difficulty in walking, not elsewhere classified (R26.2);History of falling (Z91.81);Pain Pain - Right/Left: Left Pain - part of body: Hip     Time: 0830-0901 PT Time Calculation (min) (ACUTE ONLY): 31 min  Charges:  $Gait Training: 8-22 mins $Therapeutic Exercise: 8-22 mins                     Malachi Pro, DPT 03/13/2020, 10:16 AM

## 2020-03-13 NOTE — Progress Notes (Signed)
Physical Therapy Treatment Patient Details Name: Perry Bullock MRN: [id number] DOB: [birth year] Today's Date: 03/13/2020    History of Present Illness Perry Bullock is a 59 y.o. male with IM fixation of L intertrochanteric hip fx following fall. Past medical history significant of alcohol abuse with recurrent hospitalization, bipolar disorder, anxiety disorder, asthma, previous multiple fractures, COPD, hepatitis C, peripheral vascular disease and degenerative disc disease who has had previous right femoral neck fracture from a fall. Has been using a hurry-cane since multiple trauma fall in 2017.    PT Comments    Pt motivated and eager to do as much walking as he can.  On arrival, as has been his normal, his HR was ~100, increases with activity as high as 130 again this afternoon but O2 remained in the mid 90s and despite fatigue he was motivated to push himself and he walked ~75 ft with slow and labored effort and heavy walker reliance.  Pt still needing some assist with in/out of bed and getting to standing but has made good gains today.     Follow Up Recommendations  SNF     Equipment Recommendations  Rolling walker with 5" wheels;3in1 (PT)    Recommendations for Other Services       Precautions / Restrictions Precautions Precautions: Fall Restrictions LLE Weight Bearing: Weight bearing as tolerated    Mobility  Bed Mobility Overal bed mobility: Needs Assistance Bed Mobility: Supine to Sit;Sit to Supine     Supine to sit: Min assist Sit to supine: Mod assist   General bed mobility comments: Pt showed good effort in gettin gto EOB, did need assist with L LE as well as light assist to get torso upright.  More assist needed to get LEs back into bed  Transfers Overall transfer level: Needs assistance Equipment used: Rolling walker (2 wheeled) Transfers: Sit to/from Stand Sit to Stand: Min assist         General transfer comment: Elevated bed height to assist, good  effort but definite need for assist to attain upright  Ambulation/Gait Ambulation/Gait assistance: Min guard Gait Distance (Feet): 75 Feet Assistive device: Rolling walker (2 wheeled)       General Gait Details: Longer bout of ambulation into the hallway and back.  He had very slow and labored step-to ambulation with heavy UE reliance on the walker.  HR elevated in the 120s most of the time and up to 130   Stairs             Wheelchair Mobility    Modified Rankin (Stroke Patients Only)       Balance Overall balance assessment: Needs assistance Sitting-balance support: Feet supported Sitting balance-Leahy Scale: Good     Standing balance support: Bilateral upper extremity supported Standing balance-Leahy Scale: Good Standing balance comment: Pt reliant on the walker, but did not have any stagger steps/LOBs/or overt safety issues apart for excessive reliance on the walker during standing functional acts                            Cognition Arousal/Alertness: Awake/alert Behavior During Therapy: WFL for tasks assessed/performed Overall Cognitive Status: Within Functional Limits for tasks assessed                                        Exercises General Exercises - Lower Extremity Ankle Circles/Pumps:  AROM;15 reps Quad Sets: Strengthening;10 reps Gluteal Sets: AROM;10 reps Heel Slides: AROM;10 reps (resisted leg extensions) Hip ABduction/ADduction: AROM;Strengthening;10 reps    General Comments        Pertinent Vitals/Pain Pain Score: 8  Pain Location: L hip    Home Living                      Prior Function            PT Goals (current goals can now be found in the care plan section) Progress towards PT goals: Progressing toward goals    Frequency    BID      PT Plan Current plan remains appropriate    Co-evaluation              AM-PAC PT "6 Clicks" Mobility   Outcome Measure  Help needed  turning from your back to your side while in a flat bed without using bedrails?: A Little Help needed moving from lying on your back to sitting on the side of a flat bed without using bedrails?: A Little Help needed moving to and from a bed to a chair (including a wheelchair)?: A Little Help needed standing up from a chair using your arms (e.g., wheelchair or bedside chair)?: A Little Help needed to walk in hospital room?: A Lot Help needed climbing 3-5 steps with a railing? : A Lot 6 Click Score: 16    End of Session Equipment Utilized During Treatment: Gait belt Activity Tolerance: Patient tolerated treatment well Patient left: with bed alarm set;with call bell/phone within reach   PT Visit Diagnosis: Unsteadiness on feet (R26.81);Muscle weakness (generalized) (M62.81);Other abnormalities of gait and mobility (R26.89);Repeated falls (R29.6);Difficulty in walking, not elsewhere classified (R26.2);History of falling (Z91.81);Pain Pain - Right/Left: Left Pain - part of body: Hip     Time: 4174-0814 PT Time Calculation (min) (ACUTE ONLY): 34 min  Charges:  $Gait Training: 8-22 mins $Therapeutic Exercise: 8-22 mins                     Malachi Pro, DPT 03/13/2020, 5:48 PM

## 2020-03-13 NOTE — Progress Notes (Signed)
   03/13/20 0304  Assess: MEWS Score  Temp 100.2 F (37.9 C)  BP 93/64  Pulse Rate (!) 130 (nurse notified )  Resp 18  Level of Consciousness Alert  SpO2 90 %  O2 Device Room Air  Assess: MEWS Score  MEWS Temp 0  MEWS Systolic 1  MEWS Pulse 3  MEWS RR 0  MEWS LOC 0  MEWS Score 4  MEWS Score Color Red  Assess: if the MEWS score is Yellow or Red  Were vital signs taken at a resting state? No  Focused Assessment No change from prior assessment  Early Detection of Sepsis Score *See Row Information* Low  MEWS guidelines implemented *See Row Information* Yes  Treat  MEWS Interventions Administered scheduled meds/treatments;Administered prn meds/treatments  Pain Scale 0-10  Pain Score 8  Pain Type Surgical pain  Pain Location Hip  Pain Orientation Left  Take Vital Signs  Increase Vital Sign Frequency  Red: Q 1hr X 4 then Q 4hr X 4, if remains red, continue Q 4hrs  Notify: Charge Nurse/RN  Name of Charge Nurse/RN Notified Debrah RN  Date Charge Nurse/RN Notified 03/13/20  Time Charge Nurse/RN Notified 0330  Notify: Provider  Provider Name/Title Manuela Schwartz  Date Provider Notified 03/13/20  Time Provider Notified [phone]  Notification Type Call  Notification Reason Change in status  Response No new orders  Date of Provider Response 03/13/20  Time of Provider Response 0530  Document  Patient Outcome Not stable and remains on department  Progress note created (see row info) Yes

## 2020-03-13 NOTE — Progress Notes (Signed)
Patient flagged a Yellow MEWS for HR and B/P because of pain level. Nurse notified MD and charge nurse, and administered pain medications

## 2020-03-13 NOTE — TOC Progression Note (Signed)
Transition of Care [facility]) - Progression Note    Patient Details  Name: RENWICK ASMAN MRN: [id number] Date of Birth: 05/19/1961  [facility] of [facility]) CM/SW Contact  Trenton Founds, RN Phone Number: 03/13/2020, 7:48 AM  Clinical Narrative:   RNCM extended bed search to additional facilities in [place] due to lack of bed offers.     Expected Discharge Plan: Skilled Nursing Facility Barriers to Discharge: Continued Medical Work up  Expected Discharge Plan and Services Expected Discharge Plan: Skilled Nursing Facility     Post Acute Care Choice: Skilled Nursing Facility Living arrangements for the past 2 months: Single Family Home                                       Social Determinants of Health (SDOH) Interventions    Readmission Risk Interventions No flowsheet data found.

## 2020-03-13 NOTE — Progress Notes (Signed)
OT Cancellation Note  Patient Details Name: VONTRELL PULLMAN MRN: [id number] DOB: 11/03/1960   Cancelled Treatment:    Reason Eval/Treat Not Completed: Other (comment). Chart reviewed. Pt now pending chest x-ray to rule out aspiration pneumonia. Will hold and re-attempt OT tx at later time as pt is available and medically appropriate.   Richrd Prime, MPH, MS, OTR/L ascom [phone] 03/13/20, 11:14 AM

## 2020-03-13 NOTE — Progress Notes (Addendum)
PROGRESS NOTE    Perry Bullock  VWP:[id number] DOB: 1960-10-05 DOA: 03/08/2020 PCP: [facility]   Chief complaint.  Hip pain.  Brief Narrative:  Perry Bullock a 59 y.o.malewith medical history significant ofalcohol abuse with recurrent hospitalization, bipolar disorder, anxiety disorder, asthma, previous multiple fractures, COPD, hepatitis C, peripheral vascular disease and degenerative disc disease who has had previous right femoral neck fracture from a fall. He had a fall before admission due to intoxication from alcohol. Sustained a left hip fracture.  9/10.No confusion today. Elevated blood pressure and heart rate due to pain. No evidence of alcohol withdrawal.Hip surgery with intertrochanteric nail is performed.  9/11.Blood pressure is low this morning, give 500 mL fluid bolus. Hemoglobin stable.  9/12.No evidence of alcohol withdrawal today. Blood pressure stabilized.  9/13.  Still medically stable, pending SNF placement.  9/14.  Has leukocytosis today, check a chest x-ray and a UA.  No bed offer for SNF placement.   Assessment & Plan:   Principal Problem:   Displaced intertrochanteric fracture of left femur, initial encounter for closed fracture [facility]) Active Problems:   Alcohol abuse   Asthma   Bipolar 1 disorder (HCC)   COPD (chronic obstructive pulmonary disease) (HCC)   Hypotension   Thrombocytopenia (HCC)   Acute blood loss anemia   Hypokalemia   Hypomagnesemia  #1.  Left hip fracture. Postop day #4. Continue physical therapy, Occupational Therapy. Pending SNF placement.  2.  Leukocytosis. Patient has significant increase of white cell count today, due to his chronic alcoholism, will obtain chest x-ray to rule out aspiration pneumonia.  Also obtain a UA.  3.  Alcohol abuse. No evidence of withdrawal.  Continue Librium, thiamine, folic acid.  4.  Thrombocytopenia. Secondary to alcohol, surgery.  Condition  resolved.  5.  Hypokalemia and hypophosphatemia. Improved.  6.  COPD. Stable.  7.  Bipolar disorder. Continue home medicines.  8.  Hypotension. Patient had transient hypotension postop, resolved.  9.  Acute blood loss anemia with iron deficient anemia. Homocystine will be checked for borderline B12 level.  We will give a dose of B12 shot and oral supplement of iron.  Addendum: 3748. Reviewed chest x-ray, left midlung airspace disease.  Most likely pneumonia.  Patient probably had aspiration pneumonia while asleep.  Will start antibiotics with Augmentin.  DVT prophylaxis: Eliquis at the prophylaxis dose Code Status: Full Family Communication: None  .   Status is: Inpatient  Remains inpatient appropriate because:Inpatient level of care appropriate due to severity of illness   Dispo: The patient is from: Home              Anticipated d/c is to: SNF vs home              Anticipated d/c date is: 1 day              Patient currently is not medically stable to d/c.  Patient currently has no bed offer from SNF, continue physical therapy/Occupational Therapy.  Will assess daily, continue to progress enough to go home before SNF bed available.      I/O last 3 completed shifts: In: 2268 [P.O.:2268] Out: 5302 [Urine:5301; Stool:1] Total I/O In: 480 [P.O.:480] Out: 350 [Urine:350]     Consultants:   Orthopedics  Procedures: Hip surgery.  Antimicrobials: None  Subjective: Has some tachycardia today, does not feel short of breath.  He has a cough, nonproductive. No fever or chills. He does not have any dysuria hematuria. No abdominal  pain nausea vomiting or diarrhea.  He had a 2 large bowel movements yesterday after giving stool softeners.  Objective: Vitals:   03/13/20 0503 03/13/20 0609 03/13/20 0723 03/13/20 0945  BP: 96/63 109/78 99/70 93/63   Pulse: (!) 116 (!) 110 (!) 109 (!) 104  Resp: 17  18 16   Temp: 98.9 F (37.2 C) 99.1 F (37.3 C) 99.3 F (37.4 C)  98.5 F (36.9 C)  TempSrc: Oral Oral Oral Oral  SpO2: 96% 93% 93% 95%  Weight:      Height:        Intake/Output Summary (Last 24 hours) at 03/13/2020 1048 Last data filed at 03/13/2020 1006 Gross per 24 hour  Intake 1200 ml  Output 2777 ml  Net -1577 ml   Filed Weights   03/08/20 1513  Weight: 77.1 kg    Examination:  General exam: Appears calm and comfortable  Respiratory system: Decreased breathing sounds without crackles or wheezes.  Respiratory effort normal. Cardiovascular system: S1 & S2 heard, RRR. No JVD, murmurs, rubs, gallops or clicks. No pedal edema. Gastrointestinal system: Abdomen is nondistended, soft and nontender. No organomegaly or masses felt. Normal bowel sounds heard. Central nervous system: Alert and oriented. No focal neurological deficits. Extremities: Symmetric  Skin: No rashes, lesions or ulcers Psychiatry: Mood & affect appropriate.     Data Reviewed: I have personally reviewed following labs and imaging studies  CBC: Recent Labs  Lab 03/08/20 1740 03/08/20 2132 03/09/20 0437 03/10/20 0426 03/11/20 0433 03/12/20 0243 03/13/20 0624  WBC 9.0   < > 9.0 5.7 7.1 9.3 18.1*  NEUTROABS 6.3  --   --  3.7 4.9 6.3 14.8*  HGB 12.1*   < > 12.4* 8.0* 8.1* 8.7* 8.6*  HCT 35.2*   < > 36.3* 22.8* 24.6* 26.5* 26.0*  MCV 98.6   < > 102.0* 97.9 102.5* 101.9* 103.6*  PLT 199   < > 157 107* 108* 145* 172   < > = values in this interval not displayed.   Basic Metabolic Panel: Recent Labs  Lab 03/08/20 2132 03/08/20 2132 03/09/20 0649 03/09/20 0649 03/10/20 0426 03/11/20 0433 03/11/20 1210 03/12/20 0243 03/13/20 0624  NA 142   < > 140  --  136 136  --  137 139  K 3.5   < > 4.2   < > 3.2* 5.5* 4.5 4.7 4.9  CL 106   < > 108  --  106 107  --  101 102  CO2 24   < > 23  --  23 22  --  27 28  GLUCOSE 84   < > 123*  --  110* 148*  --  131* 140*  BUN 7   < > 7  --  7 9  --  9 12  CREATININE 0.61   < > 0.61  --  0.62 0.69  --  0.58* 0.79  CALCIUM 8.2*    < > 8.2*  --  7.6* 8.1*  --  8.7* 9.2  MG 1.4*  --   --   --  1.5* 1.7  --  1.4* 1.8  PHOS 4.4  --   --   --   --   --   --   --   --    < > = values in this interval not displayed.   GFR: Estimated Creatinine Clearance: 105.9 mL/min (by C-G formula based on SCr of 0.79 mg/dL). Liver Function Tests: Recent Labs  Lab 03/08/20 2132 03/09/20 05/09/20  AST 65* 65*  ALT 27 28  ALKPHOS 114 113  BILITOT 1.0 1.6*  PROT 6.6 6.1*  ALBUMIN 3.4* 3.2*   No results for input(s): LIPASE, AMYLASE in the last 168 hours. No results for input(s): AMMONIA in the last 168 hours. Coagulation Profile: Recent Labs  Lab 03/08/20 1740  INR 1.0   Cardiac Enzymes: No results for input(s): CKTOTAL, CKMB, CKMBINDEX, TROPONINI in the last 168 hours. BNP (last 3 results) No results for input(s): PROBNP in the last 8760 hours. HbA1C: No results for input(s): HGBA1C in the last 72 hours. CBG: Recent Labs  Lab 03/09/20 2352  GLUCAP 138*   Lipid Profile: No results for input(s): CHOL, HDL, LDLCALC, TRIG, CHOLHDL, LDLDIRECT in the last 72 hours. Thyroid Function Tests: No results for input(s): TSH, T4TOTAL, FREET4, T3FREE, THYROIDAB in the last 72 hours. Anemia Panel: Recent Labs    03/12/20 0243  VITAMINB12 256  TIBC 189*  IRON 29*   Sepsis Labs: No results for input(s): PROCALCITON, LATICACIDVEN in the last 168 hours.  Recent Results (from the past 240 hour(s))  SARS Coronavirus 2 by RT PCR (hospital order, performed in [facility] hospital lab) Nasopharyngeal Nasopharyngeal Swab     Status: None   Collection Time: 03/08/20  4:47 PM   Specimen: Nasopharyngeal Swab  Result Value Ref Range Status   SARS Coronavirus 2 NEGATIVE NEGATIVE Final    Comment: (NOTE) SARS-CoV-2 target nucleic acids are NOT DETECTED.  The SARS-CoV-2 RNA is generally detectable in upper and lower respiratory specimens during the acute phase of infection. The lowest concentration of SARS-CoV-2 viral copies this assay  can detect is 250 copies / mL. A negative result does not preclude SARS-CoV-2 infection and should not be used as the sole basis for treatment or other patient management decisions.  A negative result may occur with improper specimen collection / handling, submission of specimen other than nasopharyngeal swab, presence of viral mutation(s) within the areas targeted by this assay, and inadequate number of viral copies (<250 copies / mL). A negative result must be combined with clinical observations, patient history, and epidemiological information.  Fact Sheet for Patients:   BoilerBrush.com.cy  Fact Sheet for Healthcare Providers: https://pope.com/  This test is not yet approved or  cleared by the [place] FDA and has been authorized for detection and/or diagnosis of SARS-CoV-2 by FDA under an Emergency Use Authorization (EUA).  This EUA will remain in effect (meaning this test can be used) for the duration of the COVID-19 declaration under Section 564(b)(1) of the Act, 21 U.S.C. section 360bbb-3(b)(1), unless the authorization is terminated or revoked sooner.  Performed at [facility], [address] [place], [place] [address]          Radiology Studies: No results found.      Scheduled Meds: . allopurinol  100 mg Oral Daily  . apixaban  2.5 mg Oral BID  . chlordiazePOXIDE  25 mg Oral BID  . Chlorhexidine Gluconate Cloth  6 each Topical Daily  . diclofenac Sodium  2 g Topical QID  . docusate sodium  100 mg Oral BID  . fluticasone  2 spray Each Nare Daily  . folic acid  1 mg Oral Daily  . gabapentin  600 mg Oral TID  . iron polysaccharides  150 mg Oral Daily  . lidocaine  1 patch Transdermal Q24H  . loratadine  10 mg Oral Daily  . mometasone-formoterol  2 puff Inhalation BID  . multivitamin with minerals  1  tablet Oral Daily  . pantoprazole  40 mg Oral Daily  . senna  1 tablet Oral BID  . thiamine   100 mg Oral Daily   Or  . thiamine  100 mg Intravenous Daily  . tiotropium  18 mcg Inhalation Daily  . traMADol  50 mg Oral Q6H   Continuous Infusions:   LOS: 5 days    Time spent: 28 minutes    Marrion Coyekui Deatrice Spanbauer, MD [facility]   To contact the attending provider between 7A-7P or the covering provider during after hours 7P-7A, please log into the web site [email or web address] and access using universal [place] password for that web site. If you do not have the password, please call the hospital operator.  03/13/2020, 10:48 AM

## 2020-03-14 DIAGNOSIS — S72142A Displaced intertrochanteric fracture of left femur, initial encounter for closed fracture: Principal | ICD-10-CM

## 2020-03-14 LAB — CBC WITH DIFFERENTIAL/PLATELET
Abs Immature Granulocytes: 0.06 10*3/uL (ref 0.00–0.07)
Basophils Absolute: 0.1 10*3/uL (ref 0.0–0.1)
Basophils Relative: 1 %
Eosinophils Absolute: 0.6 10*3/uL — ABNORMAL HIGH (ref 0.0–0.5)
Eosinophils Relative: 5 %
HCT: 24.9 % — ABNORMAL LOW (ref 39.0–52.0)
Hemoglobin: 8.1 g/dL — ABNORMAL LOW (ref 13.0–17.0)
Immature Granulocytes: 1 %
Lymphocytes Relative: 15 %
Lymphs Abs: 1.5 10*3/uL (ref 0.7–4.0)
MCH: 33.6 pg (ref 26.0–34.0)
MCHC: 32.5 g/dL (ref 30.0–36.0)
MCV: 103.3 fL — ABNORMAL HIGH (ref 80.0–100.0)
Monocytes Absolute: 1.5 10*3/uL — ABNORMAL HIGH (ref 0.1–1.0)
Monocytes Relative: 15 %
Neutro Abs: 6.5 10*3/uL (ref 1.7–7.7)
Neutrophils Relative %: 63 %
Platelets: 215 10*3/uL (ref 150–400)
RBC: 2.41 MIL/uL — ABNORMAL LOW (ref 4.22–5.81)
RDW: 12.9 % (ref 11.5–15.5)
WBC: 10.2 10*3/uL (ref 4.0–10.5)
nRBC: 0 % (ref 0.0–0.2)

## 2020-03-14 LAB — URINALYSIS, ROUTINE W REFLEX MICROSCOPIC
Bilirubin Urine: NEGATIVE
Glucose, UA: NEGATIVE mg/dL
Ketones, ur: NEGATIVE mg/dL
Nitrite: NEGATIVE
Protein, ur: NEGATIVE mg/dL
Specific Gravity, Urine: 1.009 (ref 1.005–1.030)
Squamous Epithelial / HPF: NONE SEEN (ref 0–5)
WBC, UA: >50 WBC/hpf — ABNORMAL HIGH (ref 0–5)
pH: 7 (ref 5.0–8.0)

## 2020-03-14 LAB — BASIC METABOLIC PANEL
Anion gap: 7 (ref 5–15)
BUN: 16 mg/dL (ref 6–20)
CO2: 29 mmol/L (ref 22–32)
Calcium: 8.9 mg/dL (ref 8.9–10.3)
Chloride: 102 mmol/L (ref 98–111)
Creatinine, Ser: 0.67 mg/dL (ref 0.61–1.24)
GFR calc Af Amer: >60 mL/min (ref >60)
GFR calc non Af Amer: >60 mL/min (ref >60)
Glucose, Bld: 113 mg/dL — ABNORMAL HIGH (ref 70–99)
Potassium: 4.4 mmol/L (ref 3.5–5.1)
Sodium: 138 mmol/L (ref 135–145)

## 2020-03-14 LAB — MAGNESIUM: Magnesium: 1.5 mg/dL — ABNORMAL LOW (ref 1.7–2.4)

## 2020-03-14 LAB — HOMOCYSTEINE: Homocysteine: 16.3 umol/L — ABNORMAL HIGH (ref 0.0–14.5)

## 2020-03-14 MED ORDER — MAGNESIUM SULFATE 2 GM/50ML IV SOLN
2.0000 g | Freq: Once | INTRAVENOUS | Status: AC
  Administered 2020-03-14: 2 g via INTRAVENOUS
  Filled 2020-03-14: qty 50

## 2020-03-14 NOTE — Progress Notes (Signed)
Physical Therapy Treatment Patient Details Name: Perry Bullock MRN: [id number] DOB: 09/05/1960 Today's Date: 03/14/2020    History of Present Illness NIRANJAN RUFENER is a 59 y.o. male with IM fixation of L intertrochanteric hip fx following fall. Past medical history significant of alcohol abuse with recurrent hospitalization, bipolar disorder, anxiety disorder, asthma, previous multiple fractures, COPD, hepatitis C, peripheral vascular disease and degenerative disc disease who has had previous right femoral neck fracture from a fall. Has been using a hurry-cane since multiple trauma fall in 2017.    PT Comments    Pt initially reluctant to ambulate secondary to having PT this AM but with education on benefits of activity especially related to returning home vs SNF pt was willing to participate and put forth good effort during the session.  Pt required min A to manage his LLE during sit to sup but did not require physical assist with any other functional task.  Pt was very antalgic during ambulation with gait regressing from initial step-through pattern to step-to pattern with very slow cadence and short step length.  Pt will benefit from PT services in a SNF setting upon discharge to safely address deficits listed in patient problem list for decreased caregiver assistance and eventual return to PLOF.   Follow Up Recommendations  SNF     Equipment Recommendations  Rolling walker with 5" wheels;3in1 (PT)    Recommendations for Other Services OT consult     Precautions / Restrictions Precautions Precautions: Fall Restrictions Weight Bearing Restrictions: Yes LLE Weight Bearing: Weight bearing as tolerated    Mobility  Bed Mobility Overal bed mobility: Needs Assistance Bed Mobility: Supine to Sit;Sit to Supine     Supine to sit: Supervision Sit to supine: Min assist   General bed mobility comments: Extra time and effort only with sup to sit and min A for LLE control during sit  to sup  Transfers Overall transfer level: Needs assistance Equipment used: Rolling walker (2 wheeled) Transfers: Sit to/from Stand Sit to Stand: Min guard;From elevated surface         General transfer comment: Extra time and effort required to stand from an elevated surface but no physical assist required this session  Ambulation/Gait Ambulation/Gait assistance: Min guard Gait Distance (Feet): 80 Feet Assistive device: Rolling walker (2 wheeled) Gait Pattern/deviations: Step-to pattern;Step-through pattern;Antalgic;Trunk flexed;Decreased step length - left;Decreased step length - right;Decreased stance time - left Gait velocity: decreased   General Gait Details: Pt initially ambulated with step-through pattern but with decreased LLE stance time but gait pattern regressed to step-to secondary to L hip pain   Stairs             Wheelchair Mobility    Modified Rankin (Stroke Patients Only)       Balance Overall balance assessment: Needs assistance Sitting-balance support: No upper extremity supported;Feet supported Sitting balance-Leahy Scale: Good Sitting balance - Comments: steady sitting reaching within BOS   Standing balance support: Bilateral upper extremity supported;During functional activity Standing balance-Leahy Scale: Fair Standing balance comment: Mod lean on the RW for support but no LOB in standing                            Cognition Arousal/Alertness: Awake/alert Behavior During Therapy: WFL for tasks assessed/performed Overall Cognitive Status: Within Functional Limits for tasks assessed  Exercises Total Joint Exercises Ankle Circles/Pumps: AROM;Strengthening;Both;10 reps Quad Sets: Strengthening;Both;10 reps Gluteal Sets: Strengthening;Both;10 reps Short Arc Quad: AAROM;Strengthening;Left;10 reps;Supine Heel Slides: AAROM;Left;AROM;Right;Strengthening;10 reps;Supine Hip  ABduction/ADduction: AAROM;Strengthening;Left;10 reps Straight Leg Raises: AAROM;Strengthening;Left;10 reps Long Arc Quad: AROM;Strengthening;Both;15 reps Other Exercises Other Exercises: HEP education and review for BLE APs, QS, and GS x 10 each every 1-2 hours daily    General Comments        Pertinent Vitals/Pain Pain Assessment: 0-10 Pain Score: 8  Pain Location: L hip Pain Descriptors / Indicators: Aching;Sore Pain Intervention(s): Monitored during session;Patient requesting pain meds-RN notified    Home Living                      Prior Function            PT Goals (current goals can now be found in the care plan section) Acute Rehab PT Goals Patient Stated Goal: go home PT Goal Formulation: With patient Time For Goal Achievement: 03/24/20 Potential to Achieve Goals: Fair Progress towards PT goals: Progressing toward goals    Frequency    BID      PT Plan Current plan remains appropriate    Co-evaluation              AM-PAC PT "6 Clicks" Mobility   Outcome Measure  Help needed turning from your back to your side while in a flat bed without using bedrails?: A Little Help needed moving from lying on your back to sitting on the side of a flat bed without using bedrails?: A Little Help needed moving to and from a bed to a chair (including a wheelchair)?: A Little Help needed standing up from a chair using your arms (e.g., wheelchair or bedside chair)?: A Little Help needed to walk in hospital room?: A Little Help needed climbing 3-5 steps with a railing? : A Lot 6 Click Score: 17    End of Session Equipment Utilized During Treatment: Gait belt Activity Tolerance: Patient limited by pain Patient left: in bed;with call bell/phone within reach;with bed alarm set;with SCD's reapplied (Pt declined up in chair; educated on benefits of OOB) Nurse Communication: Mobility status;Patient requests pain meds PT Visit Diagnosis: Unsteadiness on feet  (R26.81);Muscle weakness (generalized) (M62.81);Other abnormalities of gait and mobility (R26.89);Repeated falls (R29.6);Difficulty in walking, not elsewhere classified (R26.2);History of falling (Z91.81);Pain Pain - Right/Left: Left Pain - part of body: Hip     Time: 2426-8341 PT Time Calculation (min) (ACUTE ONLY): 26 min  Charges:  $Gait Training: 8-22 mins $Therapeutic Exercise: 8-22 mins $Therapeutic Activity: 8-22 mins                     D. Scott Rolan Wrightsman PT, DPT 03/14/20, 4:02 PM

## 2020-03-14 NOTE — Progress Notes (Signed)
Physical Therapy Treatment Patient Details Name: Perry Bullock MRN: [id number] DOB: 09/09/1960 Today's Date: 03/14/2020    History of Present Illness Perry Bullock is a 59 y.o. male with IM fixation of L intertrochanteric hip fx following fall. Past medical history significant of alcohol abuse with recurrent hospitalization, bipolar disorder, anxiety disorder, asthma, previous multiple fractures, COPD, hepatitis C, peripheral vascular disease and degenerative disc disease who has had previous right femoral neck fracture from a fall. Has been using a hurry-cane since multiple trauma fall in 2017.    PT Comments    Pt resting in bed upon PT arrival and reporting recent pain medication; agreeable to walking.  Tolerated LE ex's in bed fairly well with assist for L LE as needed.  Min assist semi-supine to sitting edge of bed; CGA to min assist to stand from bed; and CGA to ambulate 100 feet with RW.  Pt's HR 108 bpm at rest beginning/end of session but HR increased into 120's and at most 134 bpm during ambulation (nurse notified).  Pt continues to be progressing with ambulation distance and appears very motivated to improve functional mobility.  Will continue to focus on strengthening and progressive functional mobility per pt tolerance.    Follow Up Recommendations  SNF     Equipment Recommendations  Rolling walker with 5" wheels;3in1 (PT)    Recommendations for Other Services OT consult     Precautions / Restrictions Precautions Precautions: Fall Restrictions Weight Bearing Restrictions: Yes LLE Weight Bearing: Weight bearing as tolerated    Mobility  Bed Mobility Overal bed mobility: Needs Assistance Bed Mobility: Supine to Sit     Supine to sit: Min assist     General bed mobility comments: assist for L LE; increased effort to perform as much as pt could on his own  Transfers Overall transfer level: Needs assistance Equipment used: Rolling walker (2 wheeled) Transfers:  Sit to/from Stand Sit to Stand: Min guard;Min assist         General transfer comment: minimal assist to stand from bed and control descent sitting down into recliner; vc's for scooting towards edge of bed prior to standing; vc's for UE placement for transfers  Ambulation/Gait Ambulation/Gait assistance: Min guard Gait Distance (Feet): 100 Feet Assistive device: Rolling walker (2 wheeled) Gait Pattern/deviations: Step-to pattern Gait velocity: decreased   General Gait Details: 2 short standing rest breaks; steady with RW   Stairs             Wheelchair Mobility    Modified Rankin (Stroke Patients Only)       Balance Overall balance assessment: Needs assistance Sitting-balance support: No upper extremity supported;Feet supported Sitting balance-Leahy Scale: Good Sitting balance - Comments: steady sitting reaching within BOS   Standing balance support: Single extremity supported Standing balance-Leahy Scale: Fair Standing balance comment: pt requiring at least single UE support for static standing balance                            Cognition Arousal/Alertness: Awake/alert Behavior During Therapy: WFL for tasks assessed/performed Overall Cognitive Status: Within Functional Limits for tasks assessed                                        Exercises Total Joint Exercises Ankle Circles/Pumps: AROM;Strengthening;Both;10 reps;Supine Quad Sets: AROM;Strengthening;Both;10 reps;Supine Short Arc Quad: AAROM;Strengthening;Left;10 reps;Supine Heel Slides: AAROM;Left;AROM;Right;Strengthening;10  reps;Supine Hip ABduction/ADduction: AAROM;Strengthening;Left;10 reps;Supine    General Comments   Nursing cleared pt for participation in physical therapy.  Pt agreeable to PT session.      Pertinent Vitals/Pain Pain Score: 8  Pain Location: L hip Pain Descriptors / Indicators: Aching Pain Intervention(s): Limited activity within patient's  tolerance;Monitored during session;Premedicated before session;Repositioned  O2 sats WFL on room air    Home Living                      Prior Function            PT Goals (current goals can now be found in the care plan section) Acute Rehab PT Goals Patient Stated Goal: go home PT Goal Formulation: With patient Time For Goal Achievement: 03/24/20 Potential to Achieve Goals: Fair Progress towards PT goals: Progressing toward goals    Frequency    BID      PT Plan Current plan remains appropriate    Co-evaluation              AM-PAC PT "6 Clicks" Mobility   Outcome Measure  Help needed turning from your back to your side while in a flat bed without using bedrails?: A Little Help needed moving from lying on your back to sitting on the side of a flat bed without using bedrails?: A Little Help needed moving to and from a bed to a chair (including a wheelchair)?: A Little Help needed standing up from a chair using your arms (e.g., wheelchair or bedside chair)?: A Little Help needed to walk in hospital room?: A Little Help needed climbing 3-5 steps with a railing? : A Lot 6 Click Score: 17    End of Session Equipment Utilized During Treatment: Gait belt Activity Tolerance: Patient limited by pain Patient left: in chair;with call bell/phone within reach;with chair alarm set;Other (comment) (B heels floating via pillow support; pt reclined in chair which improved comfort) Nurse Communication: Mobility status;Precautions;Other (comment) (pt's vitals during session) PT Visit Diagnosis: Unsteadiness on feet (R26.81);Muscle weakness (generalized) (M62.81);Other abnormalities of gait and mobility (R26.89);Repeated falls (R29.6);Difficulty in walking, not elsewhere classified (R26.2);History of falling (Z91.81);Pain Pain - Right/Left: Left Pain - part of body: Hip     Time: 6948-5462 PT Time Calculation (min) (ACUTE ONLY): 38 min  Charges:  $Gait Training: 8-22  mins $Therapeutic Exercise: 8-22 mins $Therapeutic Activity: 8-22 mins                    Alaiya Martindelcampo, PT 03/14/20, 1:25 PM

## 2020-03-14 NOTE — TOC Progression Note (Signed)
Transition of Care [facility]) - Progression Note    Patient Details  Name: Perry Bullock MRN: [id number] Date of Birth: March 08, 1961  Transition of [facility]) CM/SW [place], RN Phone Number: 03/14/2020, 1:18 PM  Clinical Narrative:   RNCM met with patient at bedside to discuss discharge planning. Patient reports that he only wants to go to a facility if it is in [place]. Discussed with patient that there have been no bed offers for anyone in [place] and that additionally due to his Medicaid he would be required to stay in facility for a month and would have to sign his check over for that month. Patient reports that he will not go to facility and he will go home with his wife and accept home health if services can be found.     Expected Discharge Plan: [place] Barriers to Discharge: Continued Medical Work up  Expected Discharge Plan and Services Expected Discharge Plan: [place] Choice: [place] arrangements for the past 2 months: Single Family Home                                       Social Determinants of Health (SDOH) Interventions    Readmission Risk Interventions No flowsheet data found.

## 2020-03-14 NOTE — Progress Notes (Signed)
Subjective:  POD #5 s/p IM fixation for left IT hip fracture.   Patient reports left pain as mild.  Patient up out of bed to a chair.  Tolerating p.o. diet.  WBC elevated yesterday.  Objective:   VITALS:   Vitals:   03/13/20 1544 03/13/20 1926 03/13/20 2344 03/14/20 0722  BP: 102/69 97/67 105/75 113/70  Pulse: (!) 104 (!) 103 96 99  Resp: 18 17 18 16   Temp: 98.5 F (36.9 C) 98.6 F (37 C) 98 F (36.7 C) 98.4 F (36.9 C)  TempSrc:  Oral Oral Oral  SpO2: 94% 94% 94% 95%  Weight:      Height:        PHYSICAL EXAM: Left lower extremity Neurovascular intact Sensation intact distally Intact pulses distally Dorsiflexion/Plantar flexion intact Incision: moderate drainage No cellulitis present Compartment soft  LABS  Results for orders placed or performed during the hospital encounter of 03/08/20 (from the past 24 hour(s))  CBC with Differential/Platelet     Status: Abnormal   Collection Time: 03/14/20  3:57 AM  Result Value Ref Range   WBC 10.2 4.0 - 10.5 K/uL   RBC 2.41 (L) 4.22 - 5.81 MIL/uL   Hemoglobin 8.1 (L) 13.0 - 17.0 g/dL   HCT 03/16/20 (L) 39 - 52 %   MCV 103.3 (H) 80.0 - 100.0 fL   MCH 33.6 26.0 - 34.0 pg   MCHC 32.5 30.0 - 36.0 g/dL   RDW 95.0 93.2 - 67.1 %   Platelets 215 150 - 400 K/uL   nRBC 0.0 0.0 - 0.2 %   Neutrophils Relative % 63 %   Neutro Abs 6.5 1.7 - 7.7 K/uL   Lymphocytes Relative 15 %   Lymphs Abs 1.5 0.7 - 4.0 K/uL   Monocytes Relative 15 %   Monocytes Absolute 1.5 (H) 0 - 1 K/uL   Eosinophils Relative 5 %   Eosinophils Absolute 0.6 (H) 0 - 0 K/uL   Basophils Relative 1 %   Basophils Absolute 0.1 0 - 0 K/uL   Immature Granulocytes 1 %   Abs Immature Granulocytes 0.06 0.00 - 0.07 K/uL  Basic metabolic panel     Status: Abnormal   Collection Time: 03/14/20  3:57 AM  Result Value Ref Range   Sodium 138 135 - 145 mmol/L   Potassium 4.4 3.5 - 5.1 mmol/L   Chloride 102 98 - 111 mmol/L   CO2 29 22 - 32 mmol/L   Glucose, Bld 113 (H) 70 - 99  mg/dL   BUN 16 6 - 20 mg/dL   Creatinine, Ser 03/16/20 0.61 - 1.24 mg/dL   Calcium 8.9 8.9 - 8.09 mg/dL   GFR calc non Af Amer >60 >60 mL/min   GFR calc Af Amer >60 >60 mL/min   Anion gap 7 5 - 15  Magnesium     Status: Abnormal   Collection Time: 03/14/20  3:57 AM  Result Value Ref Range   Magnesium 1.5 (L) 1.7 - 2.4 mg/dL    DG Chest 2 View  Result Date: 03/13/2020 CLINICAL DATA:  Possible aspiration. EXAM: CHEST - 2 VIEW COMPARISON:  March 08, 2020. FINDINGS: The heart size and mediastinal contours are within normal limits. Status post left shoulder arthroplasty. No pneumothorax or pleural effusion is noted. Old lower thoracic compression fracture is noted. Right lung is clear. Slightly increased left midlung opacity is noted suggesting possible pneumonia. Mild pleural thickening of left hemithorax laterally is noted. IMPRESSION: Slightly increased left midlung opacity is noted  suggesting possible pneumonia. Follow-up radiographs are recommended. Electronically Signed   By: Lupita Raider M.D.   On: 03/13/2020 12:36    Assessment/Plan: 5 Days Post-Op   Principal Problem:   Displaced intertrochanteric fracture of left femur, initial encounter for closed fracture [facility]) Active Problems:   Alcohol abuse   Asthma   Bipolar 1 disorder (HCC)   COPD (chronic obstructive pulmonary disease) (HCC)   Hypotension   Thrombocytopenia (HCC)   Acute blood loss anemia   Hypokalemia   Hypomagnesemia  Patient superior dressing has moderate serosanguineous drainage and will be changed.  Continue with physical therapy.  The patient may weight-bear as tolerated left lower extremity.  Patient awaiting SNF placement.  WBC 10.2 today.  Patient afebrile.  Patient's chest x-ray yesterday shows slightly increased left midlung opacity suggesting possible pneumonia.  Patient on Augmentin.  Patient on Eliquis.    Perry Bullock , MD 03/14/2020, 1:15 PM

## 2020-03-14 NOTE — Progress Notes (Signed)
PROGRESS NOTE    Perry Bullock  ZHY:[id number] DOB: Mar 24, 1961 DOA: 03/08/2020 PCP: [facility]    Brief Narrative:  Perry Bullock a 59 y.o.malewith medical history significant ofalcohol abuse with recurrent hospitalization, bipolar disorder, anxiety disorder, asthma, previous multiple fractures, COPD, hepatitis C, peripheral vascular disease and degenerative disc disease who has had previous right femoral neck fracture from a fall. He had a fall before admission due to intoxication from alcohol. Sustained a left hip fracture.  9/10.No confusion today. Elevated blood pressure and heart rate due to pain. No evidence of alcohol withdrawal.Hip surgery with intertrochanteric nail is performed.  9/11.Blood pressure is low this morning, give 500 mL fluid bolus. Hemoglobin stable.  9/12.No evidence of alcohol withdrawal today. Blood pressure stabilized.  9/13.Still medically stable, pending SNF placement.  9/14.  Has leukocytosis today, check a chest x-ray and a UA.  No bed offer for SNF placement.    Consultants:   Orthopedics  Procedures: hip surgery  Antimicrobials:   Augmentin-9/14   Subjective: No complaints this am.   Objective: Vitals:   03/13/20 1544 03/13/20 1926 03/13/20 2344 03/14/20 0722  BP: 102/69 97/67 105/75 113/70  Pulse: (!) 104 (!) 103 96 99  Resp: 18 17 18 16   Temp: 98.5 F (36.9 C) 98.6 F (37 C) 98 F (36.7 C) 98.4 F (36.9 C)  TempSrc:  Oral Oral Oral  SpO2: 94% 94% 94% 95%  Weight:      Height:        Intake/Output Summary (Last 24 hours) at 03/14/2020 1333 Last data filed at 03/14/2020 0724 Gross per 24 hour  Intake --  Output 2250 ml  Net -2250 ml   Filed Weights   03/08/20 1513  Weight: 77.1 kg    Examination:  General exam: Appears calm and comfortable  Respiratory system: Clear to auscultation. Respiratory effort normal. Cardiovascular system: S1 & S2 heard, RRR. No JVD,  murmurs, rubs, gallops or clicks. No pedal edema. Gastrointestinal system: Abdomen is nondistended, soft and nontender. No organomegaly or masses felt. Normal bowel sounds heard. Central nervous system: Alert and oriented.  Grossly intact Extremities: no edema  Skin: Warm, dry Psychiatry: Judgement and insight appear normal. Mood & affect appropriate.     Data Reviewed: I have personally reviewed following labs and imaging studies  CBC: Recent Labs  Lab 03/10/20 0426 03/11/20 0433 03/12/20 0243 03/13/20 0624 03/14/20 0357  WBC 5.7 7.1 9.3 18.1* 10.2  NEUTROABS 3.7 4.9 6.3 14.8* 6.5  HGB 8.0* 8.1* 8.7* 8.6* 8.1*  HCT 22.8* 24.6* 26.5* 26.0* 24.9*  MCV 97.9 102.5* 101.9* 103.6* 103.3*  PLT 107* 108* 145* 172 215   Basic Metabolic Panel: Recent Labs  Lab 03/08/20 2132 03/09/20 0649 03/10/20 0426 03/10/20 0426 03/11/20 0433 03/11/20 1210 03/12/20 0243 03/13/20 0624 03/14/20 0357  NA 142   < > 136  --  136  --  137 139 138  K 3.5   < > 3.2*   < > 5.5* 4.5 4.7 4.9 4.4  CL 106   < > 106  --  107  --  101 102 102  CO2 24   < > 23  --  22  --  27 28 29   GLUCOSE 84   < > 110*  --  148*  --  131* 140* 113*  BUN 7   < > 7  --  9  --  9 12 16   CREATININE 0.61   < > 0.62  --  0.69  --  0.58* 0.79 0.67  CALCIUM 8.2*   < > 7.6*  --  8.1*  --  8.7* 9.2 8.9  MG 1.4*  --  1.5*  --  1.7  --  1.4* 1.8 1.5*  PHOS 4.4  --   --   --   --   --   --   --   --    < > = values in this interval not displayed.   GFR: Estimated Creatinine Clearance: 105.9 mL/min (by C-G formula based on SCr of 0.67 mg/dL). Liver Function Tests: Recent Labs  Lab 03/08/20 2132 03/09/20 0649  AST 65* 65*  ALT 27 28  ALKPHOS 114 113  BILITOT 1.0 1.6*  PROT 6.6 6.1*  ALBUMIN 3.4* 3.2*   No results for input(s): LIPASE, AMYLASE in the last 168 hours. No results for input(s): AMMONIA in the last 168 hours. Coagulation Profile: Recent Labs  Lab 03/08/20 1740  INR 1.0   Cardiac Enzymes: No results  for input(s): CKTOTAL, CKMB, CKMBINDEX, TROPONINI in the last 168 hours. BNP (last 3 results) No results for input(s): PROBNP in the last 8760 hours. HbA1C: No results for input(s): HGBA1C in the last 72 hours. CBG: Recent Labs  Lab 03/09/20 2352  GLUCAP 138*   Lipid Profile: No results for input(s): CHOL, HDL, LDLCALC, TRIG, CHOLHDL, LDLDIRECT in the last 72 hours. Thyroid Function Tests: No results for input(s): TSH, T4TOTAL, FREET4, T3FREE, THYROIDAB in the last 72 hours. Anemia Panel: Recent Labs    03/12/20 0243  VITAMINB12 256  TIBC 189*  IRON 29*   Sepsis Labs: No results for input(s): PROCALCITON, LATICACIDVEN in the last 168 hours.  Recent Results (from the past 240 hour(s))  SARS Coronavirus 2 by RT PCR (hospital order, performed in [facility] hospital lab) Nasopharyngeal Nasopharyngeal Swab     Status: None   Collection Time: 03/08/20  4:47 PM   Specimen: Nasopharyngeal Swab  Result Value Ref Range Status   SARS Coronavirus 2 NEGATIVE NEGATIVE Final    Comment: (NOTE) SARS-CoV-2 target nucleic acids are NOT DETECTED.  The SARS-CoV-2 RNA is generally detectable in upper and lower respiratory specimens during the acute phase of infection. The lowest concentration of SARS-CoV-2 viral copies this assay can detect is 250 copies / mL. A negative result does not preclude SARS-CoV-2 infection and should not be used as the sole basis for treatment or other patient management decisions.  A negative result may occur with improper specimen collection / handling, submission of specimen other than nasopharyngeal swab, presence of viral mutation(s) within the areas targeted by this assay, and inadequate number of viral copies (<250 copies / mL). A negative result must be combined with clinical observations, patient history, and epidemiological information.  Fact Sheet for Patients:   BoilerBrush.com.cy  Fact Sheet for Healthcare  Providers: https://pope.com/  This test is not yet approved or  cleared by the [place] FDA and has been authorized for detection and/or diagnosis of SARS-CoV-2 by FDA under an Emergency Use Authorization (EUA).  This EUA will remain in effect (meaning this test can be used) for the duration of the COVID-19 declaration under Section 564(b)(1) of the Act, 21 U.S.C. section 360bbb-3(b)(1), unless the authorization is terminated or revoked sooner.  Performed at [facility], [address], [place], [place] [address]          Radiology Studies: DG Chest 2 View  Result Date: 03/13/2020 CLINICAL DATA:  Possible aspiration. EXAM: CHEST - 2  VIEW COMPARISON:  March 08, 2020. FINDINGS: The heart size and mediastinal contours are within normal limits. Status post left shoulder arthroplasty. No pneumothorax or pleural effusion is noted. Old lower thoracic compression fracture is noted. Right lung is clear. Slightly increased left midlung opacity is noted suggesting possible pneumonia. Mild pleural thickening of left hemithorax laterally is noted. IMPRESSION: Slightly increased left midlung opacity is noted suggesting possible pneumonia. Follow-up radiographs are recommended. Electronically Signed   By: Lupita Raider M.D.   On: 03/13/2020 12:36        Scheduled Meds: . allopurinol  100 mg Oral Daily  . amoxicillin-clavulanate  1 tablet Oral Q12H  . apixaban  2.5 mg Oral BID  . chlordiazePOXIDE  25 mg Oral BID  . Chlorhexidine Gluconate Cloth  6 each Topical Daily  . diclofenac Sodium  2 g Topical QID  . docusate sodium  100 mg Oral BID  . fluticasone  2 spray Each Nare Daily  . folic acid  1 mg Oral Daily  . gabapentin  600 mg Oral TID  . iron polysaccharides  150 mg Oral Daily  . lidocaine  1 patch Transdermal Q24H  . loratadine  10 mg Oral Daily  . mometasone-formoterol  2 puff Inhalation BID  . multivitamin with minerals  1 tablet Oral  Daily  . pantoprazole  40 mg Oral Daily  . senna  1 tablet Oral BID  . thiamine  100 mg Oral Daily   Or  . thiamine  100 mg Intravenous Daily  . tiotropium  18 mcg Inhalation Daily  . traMADol  50 mg Oral Q6H   Continuous Infusions:  Assessment & Plan:   Principal Problem:   Displaced intertrochanteric fracture of left femur, initial encounter for closed fracture [facility]) Active Problems:   Alcohol abuse   Asthma   Bipolar 1 disorder (HCC)   COPD (chronic obstructive pulmonary disease) (HCC)   Hypotension   Thrombocytopenia (HCC)   Acute blood loss anemia   Hypokalemia   Hypomagnesemia   #1.  Left hip fracture. Postop day 5 status post IM fixation for left IT hip fracture  Ortho following  PT OT recommended SNF  Per Ortho patient may weight-bear as tolerated left lower extremity.   2.  Leukocytosis.-Improved, WBC 10.2, remains afebrile Work-up was done yesterday, UA urine culture pending  Chest x-ray from yesterday with slight increased left mid lung opacity suggesting possible pneumonia recommend follow-up radiograph, was started on Augmentin for possible aspiration pneumonia yesterday  3.  Alcohol abuse. No evidence of withdrawal,  Continue Librium, thiamine, folic acid    4.  Thrombocytopenia.-Secondary to alcohol abuse, surgery. Resolved.  Platelets 215 today   5.  Hyperkalemia/hypomagnesemia and hypophosphatemia. Mag today 1.5.  Will give IV magnesium 2 g Monitor levels K stable at 4.4    6.  COPD. Stable without acute exacerbation  7.  Bipolar disorder. Continue home medicines.  8.  Hypotension. Patient had transient hypotension postop, resolved.  9.  Acute blood loss anemia with iron deficient anemia. Homocystine will be checked for borderline B12 level.    Given dose of B12 shots and oral supplements  Hemoglobin 8.1, not necessary to transfuse      DVT prophylaxis: Eliquis Code Status: Full Family Communication: None at bedside an:   Status is: Inpatient  Remains inpatient appropriate because:Unsafe d/c plan   Dispo: The patient is from: Home              Anticipated d/c is to:  SNF              Anticipated d/c date is: 1 day              Patient currently is medically stable to d/c.  SNF bed pending, currently no bed offer from SNF and case management is working on this            LOS: 6 days   Time spent: 35 minutes with more than 50% COC    Lynn ItoSahar Terressa Evola, MD [facility] Pager [phone]  If 7PM-7AM, please contact night-coverage [email or web address] Password [email or web address] 03/14/2020, 1:33 PM

## 2020-03-15 LAB — MAGNESIUM: Magnesium: 1.7 mg/dL (ref 1.7–2.4)

## 2020-03-15 MED ORDER — METHOCARBAMOL 500 MG PO TABS: 500.0000 mg | ORAL_TABLET | Freq: Two times a day (BID) | ORAL | 0 refills | Status: DC | PRN

## 2020-03-15 MED ORDER — AMOXICILLIN-POT CLAVULANATE 875-125 MG PO TABS: 1.0000 | ORAL_TABLET | Freq: Two times a day (BID) | ORAL | 0 refills | Status: AC

## 2020-03-15 MED ORDER — LIDOCAINE 5 % EX PTCH: 1.0000 | MEDICATED_PATCH | CUTANEOUS | 0 refills | Status: DC

## 2020-03-15 MED ORDER — ADULT MULTIVITAMIN W/MINERALS CH: 1.0000 | ORAL_TABLET | Freq: Every day | ORAL | 0 refills | Status: DC

## 2020-03-15 MED ORDER — SENNA 8.6 MG PO TABS: 1.0000 | ORAL_TABLET | Freq: Two times a day (BID) | ORAL | 0 refills | Status: AC

## 2020-03-15 MED ORDER — DOCUSATE SODIUM 100 MG PO CAPS: 100.0000 mg | ORAL_CAPSULE | Freq: Two times a day (BID) | ORAL | 0 refills | Status: DC

## 2020-03-15 MED ORDER — THIAMINE HCL 100 MG PO TABS
100.0000 mg | ORAL_TABLET | Freq: Every day | ORAL | 0 refills | Status: DC
Start: 2020-03-15 — End: 2023-09-23

## 2020-03-15 MED ORDER — FOLIC ACID 1 MG PO TABS
1.0000 mg | ORAL_TABLET | Freq: Every day | ORAL | 0 refills | Status: DC
Start: 2020-03-15 — End: 2021-10-02

## 2020-03-15 MED ORDER — POLYSACCHARIDE IRON COMPLEX 150 MG PO CAPS: 150.0000 mg | ORAL_CAPSULE | Freq: Every day | ORAL | 0 refills | Status: DC

## 2020-03-15 MED ORDER — OXYCODONE-ACETAMINOPHEN 5-325 MG PO TABS
1.0000 | ORAL_TABLET | ORAL | 0 refills | Status: DC | PRN
Start: 2020-03-15 — End: 2021-10-04

## 2020-03-15 MED ORDER — ASPIRIN EC 325 MG PO TBEC: 325.0000 mg | DELAYED_RELEASE_TABLET | Freq: Two times a day (BID) | ORAL | 0 refills | Status: AC

## 2020-03-15 NOTE — Progress Notes (Signed)
  Subjective:  POD #6 s/p Left IM fixation for left IT hip fracture.   Patient reports left hip pain as mild.  Patient has refused SNF.  He is being discharged home today.  Objective:   VITALS:   Vitals:   03/14/20 0722 03/14/20 1619 03/14/20 2347 03/15/20 0735  BP: 113/70 116/81 101/69 108/78  Pulse: 99 (!) 103 98 96  Resp: 16 15 18 17   Temp: 98.4 F (36.9 C) 97.7 F (36.5 C) 98 F (36.7 C) 98.5 F (36.9 C)  TempSrc: Oral Oral Oral Oral  SpO2: 95% 97% 93% 94%  Weight:      Height:        PHYSICAL EXAM: Left lower extremity Neurovascular intact Sensation intact distally Intact pulses distally Dorsiflexion/Plantar flexion intact Incision: dressing C/D/I No cellulitis present Compartment soft  LABS  Results for orders placed or performed during the hospital encounter of 03/08/20 (from the past 24 hour(s))  Magnesium     Status: None   Collection Time: 03/15/20  6:35 AM  Result Value Ref Range   Magnesium 1.7 1.7 - 2.4 mg/dL    DG Chest 2 View  Result Date: 03/13/2020 CLINICAL DATA:  Possible aspiration. EXAM: CHEST - 2 VIEW COMPARISON:  March 08, 2020. FINDINGS: The heart size and mediastinal contours are within normal limits. Status post left shoulder arthroplasty. No pneumothorax or pleural effusion is noted. Old lower thoracic compression fracture is noted. Right lung is clear. Slightly increased left midlung opacity is noted suggesting possible pneumonia. Mild pleural thickening of left hemithorax laterally is noted. IMPRESSION: Slightly increased left midlung opacity is noted suggesting possible pneumonia. Follow-up radiographs are recommended. Electronically Signed   By: March 10, 2020 M.D.   On: 03/13/2020 12:36    Assessment/Plan: 6 Days Post-Op   Principal Problem:   Displaced intertrochanteric fracture of left femur, initial encounter for closed fracture (HCC) Active Problems:   Alcohol abuse   Asthma   Bipolar 1 disorder (HCC)   COPD (chronic  obstructive pulmonary disease) (HCC)   Hypotension   Thrombocytopenia (HCC)   Acute blood loss anemia   Hypokalemia   Hypomagnesemia  Dressings have been changed by RN and are C/D/I.  I have recommended he be discharged home on enteric coated aspirin 325mg  PO BID for DVT prophylaxis x 6 weeks.  Patient may WBAT on the left lower extremity and should use a walker for assistance until he follows up in the office.  Patient was reminded to avoid using alcohol with narcotic pain medication.  Patient has a history of EtOH use.  Patient will follow up in the office in 10-14 days for wound check, staple removal and xray.    , MD 03/15/2020, 12:02 PM

## 2020-03-15 NOTE — Discharge Summary (Signed)
Perry Bullock WUJ:[id number] DOB: 1961-05-22 DOA: 03/08/2020  PCP: [facility]  Admit date: 03/08/2020 Discharge date: 03/15/2020  Admitted From: Home Disposition: Home  Recommendations for Outpatient Follow-up:  1. Follow up with PCP in 1 week 2. Please obtain BMP/CBC in one week 3. Follow-up with Dr. Martha Clan orthopedics in 10 to 14 days for wound check, staple removal and x-ray  Home Health: PT (refused SNF)   Discharge Condition:Stable CODE STATUS: Full Diet recommendation: Heart Healthy  Brief/Interim Summary: Perry Bullock is a 59 y.o. male with medical history significant of alcohol abuse with recurrent hospitalization, bipolar disorder, anxiety disorder, asthma, previous multiple fractures, COPD, hepatitis C, peripheral vascular disease and degenerative disc disease who has had previous right femoral neck fracture from a fall.  Patient came in to the ER intoxicated and moaning in pain.  He apparently had a fall due to his intoxication.Patient has fracture of the left femoral neck.  Orthopedics was consulted.  #1. Left hip fracture.  status post IM fixation for left IT hip fracture  Patient refused SNF and would like to go home with home PT Ortho following .  Recommended EC aspirin 325 mg p.o. twice daily x6 weeks for DVT prophylaxis Patient may WBAT on left lower extremity and should use a walker for assistance until he follows up in the office Patient was reminded to avoid alcohol use while taking narcotics Follow-up with Dr. Martha Clan within 2 weeks    2. Leukocytosis.-Improved, WBC 10.2, remains afebrile Urine culture so far no growth Chest x-ray from  with slight increased left mid lung opacity suggesting possible pneumonia recommend follow-up radiograph, was started on Augmentin for possible aspiration pneumonia will need to continue to complete course  3. Alcohol abuse. No evidence of withdrawal,  Continue thiamine and folic acid Was  counseled on alcohol cessation and definitely avoided while taking narcotics   4. Thrombocytopenia.-Secondary to alcohol abuse, surgery. Resolved.  Platelets 215   5. Hyperkalemia/hypomagnesemia and hypophosphatemia. Magnesium was replaced, level 1.7     6. COPD. Stable without acute exacerbation  7. Bipolar disorder. Continue home meds on discharge  8. Hypotension. Patient had transient hypotension postop, resolved.  9. Acute blood loss anemia with iron deficient anemia.   Given dose of B12 shots and oral supplements  Will need to follow-up with primary care for further evaluation and management   Discharge Diagnoses:  Principal Problem:   Displaced intertrochanteric fracture of left femur, initial encounter for closed fracture [facility]) Active Problems:   Alcohol abuse   Asthma   Bipolar 1 disorder (HCC)   COPD (chronic obstructive pulmonary disease) (HCC)   Hypotension   Thrombocytopenia (HCC)   Acute blood loss anemia   Hypokalemia   Hypomagnesemia    Discharge Instructions  Discharge Instructions     Remove dressing in 72 hours   Complete by: As directed    Call MD for:  temperature >100.4   Complete by: As directed    Diet - low sodium heart healthy   Complete by: As directed    Discharge instructions   Complete by: As directed    F/u with orthopedics in 2 weeks NO alcohol drinking   Increase activity slowly   Complete by: As directed      Allergies as of 03/15/2020      Reactions   Ace Inhibitors Swelling   Angioedema 10/16 requiring intubation @ OSH 2/2 pt reportedly taking someone else's Lisinopril.  Angioedema 10/16 requiring intubation @ OSH 2/2 pt  reportedly taking someone else's Lisinopril.       Medication List    STOP taking these medications   nicotine 14 mg/24hr patch Commonly known as: Nicoderm CQ   nicotine 21 mg/24hr patch Commonly known as: NICODERM CQ - dosed in mg/24 hours   ondansetron 4 MG tablet Commonly  known as: ZOFRAN   oxyCODONE 5 MG immediate release tablet Commonly known as: Oxy IR/ROXICODONE   QUEtiapine 200 MG tablet Commonly known as: SEROQUEL   traZODone 100 MG tablet Commonly known as: DESYREL     TAKE these medications   albuterol (2.5 MG/3ML) 0.083% nebulizer solution Commonly known as: PROVENTIL Take 3 mLs (2.5 mg total) by nebulization every 6 (six) hours as needed for wheezing or shortness of breath.   ProAir HFA 108 (90 Base) MCG/ACT inhaler Generic drug: albuterol INHALE 2 PUFFS INTO LUNGS EVERY 6 HOURS AS NEEDED FOR WHEEZING OR SHORTNESS OF BREATH   allopurinol 100 MG tablet Commonly known as: ZYLOPRIM Take 1 tablet (100 mg total) by mouth daily.   amoxicillin-clavulanate 875-125 MG tablet Commonly known as: AUGMENTIN Take 1 tablet by mouth every 12 (twelve) hours for 4 days.   aspirin EC 325 MG tablet Take 1 tablet (325 mg total) by mouth 2 (two) times daily.   cetirizine 10 MG tablet Commonly known as: ZYRTEC Take 10 mg by mouth daily.   docusate sodium 100 MG capsule Commonly known as: COLACE Take 1 capsule (100 mg total) by mouth 2 (two) times daily.   Dulera 200-5 MCG/ACT Aero Generic drug: mometasone-formoterol INHALE 2 PUFFS INTO LUNGS TWICE DAILY   fluticasone 50 MCG/ACT nasal spray Commonly known as: FLONASE Place 2 sprays into both nostrils as needed.   folic acid 1 MG tablet Commonly known as: FOLVITE Take 1 tablet (1 mg total) by mouth daily.   gabapentin 600 MG tablet Commonly known as: NEURONTIN Take 600 mg by mouth 3 (three) times daily.   iron polysaccharides 150 MG capsule Commonly known as: NIFEREX Take 1 capsule (150 mg total) by mouth daily.   lidocaine 5 % Commonly known as: LIDODERM Place 1 patch onto the skin daily. Remove & Discard patch within 12 hours or as directed by MD   methocarbamol 500 MG tablet Commonly known as: ROBAXIN Take 1 tablet (500 mg total) by mouth 2 (two) times daily as needed for muscle  spasms.   multivitamin with minerals Tabs tablet Take 1 tablet by mouth daily.   oxyCODONE-acetaminophen 5-325 MG tablet Commonly known as: PERCOCET/ROXICET Take 1 tablet by mouth every 4 (four) hours as needed for moderate pain.   pantoprazole 40 MG tablet Commonly known as: PROTONIX Take 1 tablet (40 mg total) by mouth daily.   senna 8.6 MG Tabs tablet Commonly known as: SENOKOT Take 1 tablet (8.6 mg total) by mouth 2 (two) times daily for 10 days.   Spiriva HandiHaler 18 MCG inhalation capsule Generic drug: tiotropium INHALE THE CONTENTS OF ONE CAPSULE BY MOUTH ONCE DAILY   thiamine 100 MG tablet Take 1 tablet (100 mg total) by mouth daily.            Durable Medical Equipment  (From admission, onward)         Start     Ordered   03/15/20 0928  For home use only DME 3 n 1  Once        03/15/20 0928   03/15/20 0821  For home use only DME Walker  Once  Comments: Rolling walker, 3 in one  Question:  Patient needs a walker to treat with the following condition  Answer:  Weakness   03/15/20 [id number]          Follow-up Information    Juanell FairlyKrasinski, Kevin, MD On 03/30/2020.   Specialty: Orthopedic Surgery Why: at 1130a Contact information: [address] [place] [address] [phone]        [facility] On 03/23/2020.   Specialty: General Practice Why: at 10am Contact information: [address] [place] [address] [phone]              Allergies  Allergen Reactions  . Ace Inhibitors Swelling    Angioedema 10/16 requiring intubation @ OSH 2/2 pt reportedly taking someone else's Lisinopril.  Angioedema 10/16 requiring intubation @ OSH 2/2 pt reportedly taking someone else's Lisinopril.      Consultations:  Orthopedics   Procedures/Studies: DG Chest 1 View  Result Date: 03/08/2020 CLINICAL DATA:  Preop EXAM: CHEST  1 VIEW COMPARISON:  January 13, 2019 FINDINGS: There is an old right clavicle fracture. The  patient is status post prior left total shoulder arthroplasty. There appears to be stable lucency about the left shoulder prosthesis. There is stable pleural thickening along the mid left chest wall. Stable coarse airspace opacities are noted throughout both lung fields most evident in the right upper lobe. There are old healed left-sided rib fractures. There is no pneumothorax. The heart size is stable. Aortic calcifications are noted. IMPRESSION: No active disease. Electronically Signed   By: Katherine Mantlehristopher  Green M.D.   On: 03/08/2020 16:20   DG Chest 2 View  Result Date: 03/13/2020 CLINICAL DATA:  Possible aspiration. EXAM: CHEST - 2 VIEW COMPARISON:  March 08, 2020. FINDINGS: The heart size and mediastinal contours are within normal limits. Status post left shoulder arthroplasty. No pneumothorax or pleural effusion is noted. Old lower thoracic compression fracture is noted. Right lung is clear. Slightly increased left midlung opacity is noted suggesting possible pneumonia. Mild pleural thickening of left hemithorax laterally is noted. IMPRESSION: Slightly increased left midlung opacity is noted suggesting possible pneumonia. Follow-up radiographs are recommended. Electronically Signed   By: Lupita RaiderJames  Green Jr M.D.   On: 03/13/2020 12:36   CT Head Wo Contrast  Result Date: 03/08/2020 CLINICAL DATA:  Pain after fall. EXAM: CT HEAD WITHOUT CONTRAST TECHNIQUE: Contiguous axial images were obtained from the base of the skull through the vertex without intravenous contrast. COMPARISON:  Most recent head CT 01/15/2017 FINDINGS: Brain: Generalized atrophy is similar to prior exam, advanced for age. Mild periventricular chronic small vessel ischemia. No intracranial hemorrhage, mass effect, or midline shift. No hydrocephalus. The basilar cisterns are patent. No evidence of territorial infarct or acute ischemia. No extra-axial or intracranial fluid collection. Vascular: No hyperdense vessel. Skull: No fracture or  focal lesion. Sinuses/Orbits: No acute findings. Chronic opacification of lower lateral left mastoid air cells. Remote medial left orbital wall fracture. Remote nasal bone fracture with rightward nasal septal bowing. Other: None. IMPRESSION: 1. No acute intracranial abnormality. No skull fracture. 2. Unchanged atrophy, age advanced. Electronically Signed   By: Narda RutherfordMelanie  Sanford M.D.   On: 03/08/2020 16:32   CT Cervical Spine Wo Contrast  Result Date: 03/08/2020 CLINICAL DATA:  Pain after fall. EXAM: CT CERVICAL SPINE WITHOUT CONTRAST TECHNIQUE: Multidetector CT imaging of the cervical spine was performed without intravenous contrast. Multiplanar CT image reconstructions were also generated. COMPARISON:  None. FINDINGS: Alignment: No evidence of traumatic malalignment. 3  mm anterolisthesis of C4 on C5, 2 mm retrolisthesis of C5 on C6 appears degenerative. Mild straightening of normal lordosis. Skull base and vertebrae: No acute fracture. Modic endplate changes at C5-C6. The dens and skull base are intact. Soft tissues and spinal canal: No prevertebral fluid or swelling. No visible canal hematoma. Disc levels: Diffuse degenerative disc disease, most prominent at C5-C6 where there is near complete disc space loss suggestion Modic endplate changes. Multilevel facet hypertrophy, prominent at C3-C4 on the right and CT C3 on the left. Multilevel neural foraminal stenosis. Upper chest: Stable appearance of cavitary focus in the right lung apex from 02/01/2019 chest CT. No acute findings. Other: None. IMPRESSION: 1. No acute fracture or subluxation of the cervical spine. 2. Multilevel degenerative disc disease and facet hypertrophy. Electronically Signed   By: Narda Rutherford M.D.   On: 03/08/2020 16:39   DG HIP OPERATIVE UNILAT W OR W/O PELVIS LEFT  Result Date: 03/09/2020 CLINICAL DATA:  Intramedullary nail placement EXAM: OPERATIVE LEFT HIP (WITH PELVIS IF PERFORMED) 4 VIEWS TECHNIQUE: Fluoroscopic spot image(s)  were submitted for interpretation post-operatively. COMPARISON:  03/08/2020 FINDINGS: The patient has undergone intramedullary nail placement for the proximal left femur fracture. The alignment is improved. The hardware is intact. There are expected postsurgical changes. IMPRESSION: Status post ORIF of the left femur. Electronically Signed   By: Katherine Mantle M.D.   On: 03/09/2020 18:51   DG Hip Unilat W or Wo Pelvis 2-3 Views Left  Result Date: 03/08/2020 CLINICAL DATA:  Larey Seat, left hip pain EXAM: DG HIP (WITH OR WITHOUT PELVIS) 2-3V LEFT COMPARISON:  None. FINDINGS: Frontal view of the pelvis as well as frontal and cross-table lateral views of the left hip are obtained. There is a comminuted intertrochanteric left hip fracture with varus angulation and impaction at the fracture site. No dislocation. Chronic postsurgical changes are seen within the right hip and femur. The remainder of the bony pelvis is unremarkable. Moderate spondylosis and facet hypertrophy at the lumbosacral junction. IMPRESSION: 1. Comminuted impacted intertrochanteric left hip fracture. Electronically Signed   By: Sharlet Salina M.D.   On: 03/08/2020 16:16   DG FEMUR PORT MIN 2 VIEWS LEFT  Result Date: 03/09/2020 CLINICAL DATA:  Hip fracture. EXAM: LEFT FEMUR PORTABLE 2 VIEWS COMPARISON:  03/08/2020 FINDINGS: The patient has undergone intramedullary nail placement for the known proximal femur fracture on the left. There are expected postsurgical changes. The alignment is improved. The hardware is intact. Vascular calcifications are noted. IMPRESSION: Expected postsurgical changes related to ORIF of the left femur. Electronically Signed   By: Katherine Mantle M.D.   On: 03/09/2020 18:44       Subjective: No complaints today  Discharge Exam: Vitals:   03/14/20 2347 03/15/20 0735  BP: 101/69 108/78  Pulse: 98 96  Resp: 18 17  Temp: 98 F (36.7 C) 98.5 F (36.9 C)  SpO2: 93% 94%   Vitals:   03/14/20 0722 03/14/20  1619 03/14/20 2347 03/15/20 0735  BP: 113/70 116/81 101/69 108/78  Pulse: 99 (!) 103 98 96  Resp: 16 15 18 17   Temp: 98.4 F (36.9 C) 97.7 F (36.5 C) 98 F (36.7 C) 98.5 F (36.9 C)  TempSrc: Oral Oral Oral Oral  SpO2: 95% 97% 93% 94%  Weight:      Height:        General: Pt is alert, awake, not in acute distress Cardiovascular: RRR, S1/S2 +, no rubs, no gallops Respiratory: CTA bilaterally, no wheezing, no rhonchi Abdominal:  Soft, NT, ND, bowel sounds + Extremities: no edema, no cyanosis    The results of significant diagnostics from this hospitalization (including imaging, microbiology, ancillary and laboratory) are listed below for reference.     Microbiology: Recent Results (from the past 240 hour(s))  SARS Coronavirus 2 by RT PCR (hospital order, performed in [facility] hospital lab) Nasopharyngeal Nasopharyngeal Swab     Status: None   Collection Time: 03/08/20  4:47 PM   Specimen: Nasopharyngeal Swab  Result Value Ref Range Status   SARS Coronavirus 2 NEGATIVE NEGATIVE Final    Comment: (NOTE) SARS-CoV-2 target nucleic acids are NOT DETECTED.  The SARS-CoV-2 RNA is generally detectable in upper and lower respiratory specimens during the acute phase of infection. The lowest concentration of SARS-CoV-2 viral copies this assay can detect is 250 copies / mL. A negative result does not preclude SARS-CoV-2 infection and should not be used as the sole basis for treatment or other patient management decisions.  A negative result may occur with improper specimen collection / handling, submission of specimen other than nasopharyngeal swab, presence of viral mutation(s) within the areas targeted by this assay, and inadequate number of viral copies (<250 copies / mL). A negative result must be combined with clinical observations, patient history, and epidemiological information.  Fact Sheet for Patients:   BoilerBrush.com.cy  Fact Sheet for  Healthcare Providers: https://pope.com/  This test is not yet approved or  cleared by the [place] FDA and has been authorized for detection and/or diagnosis of SARS-CoV-2 by FDA under an Emergency Use Authorization (EUA).  This EUA will remain in effect (meaning this test can be used) for the duration of the COVID-19 declaration under Section 564(b)(1) of the Act, 21 U.S.C. section 360bbb-3(b)(1), unless the authorization is terminated or revoked sooner.  Performed at [facility], [address] [place], [place] [address]      Labs: BNP (last 3 results) No results for input(s): BNP in the last 8760 hours. Basic Metabolic Panel: Recent Labs  Lab 03/08/20 2132 03/09/20 0649 03/10/20 0426 03/10/20 0426 03/11/20 0433 03/11/20 1210 03/12/20 0243 03/13/20 0624 03/14/20 0357 03/15/20 0635  NA 142   < > 136  --  136  --  137 139 138  --   K 3.5   < > 3.2*   < > 5.5* 4.5 4.7 4.9 4.4  --   CL 106   < > 106  --  107  --  101 102 102  --   CO2 24   < > 23  --  22  --  27 28 29   --   GLUCOSE 84   < > 110*  --  148*  --  131* 140* 113*  --   BUN 7   < > 7  --  9  --  9 12 16   --   CREATININE 0.61   < > 0.62  --  0.69  --  0.58* 0.79 0.67  --   CALCIUM 8.2*   < > 7.6*  --  8.1*  --  8.7* 9.2 8.9  --   MG 1.4*  --  1.5*   < > 1.7  --  1.4* 1.8 1.5* 1.7  PHOS 4.4  --   --   --   --   --   --   --   --   --    < > = values in this interval not displayed.   Liver Function Tests:  Recent Labs  Lab 03/08/20 2132 03/09/20 0649  AST 65* 65*  ALT 27 28  ALKPHOS 114 113  BILITOT 1.0 1.6*  PROT 6.6 6.1*  ALBUMIN 3.4* 3.2*   No results for input(s): LIPASE, AMYLASE in the last 168 hours. No results for input(s): AMMONIA in the last 168 hours. CBC: Recent Labs  Lab 03/10/20 0426 03/11/20 0433 03/12/20 0243 03/13/20 0624 03/14/20 0357  WBC 5.7 7.1 9.3 18.1* 10.2  NEUTROABS 3.7 4.9 6.3 14.8* 6.5  HGB 8.0* 8.1* 8.7* 8.6* 8.1*  HCT 22.8*  24.6* 26.5* 26.0* 24.9*  MCV 97.9 102.5* 101.9* 103.6* 103.3*  PLT 107* 108* 145* 172 215   Cardiac Enzymes: No results for input(s): CKTOTAL, CKMB, CKMBINDEX, TROPONINI in the last 168 hours. BNP: Invalid input(s): POCBNP CBG: Recent Labs  Lab 03/09/20 2352  GLUCAP 138*   D-Dimer No results for input(s): DDIMER in the last 72 hours. Hgb A1c No results for input(s): HGBA1C in the last 72 hours. Lipid Profile No results for input(s): CHOL, HDL, LDLCALC, TRIG, CHOLHDL, LDLDIRECT in the last 72 hours. Thyroid function studies No results for input(s): TSH, T4TOTAL, T3FREE, THYROIDAB in the last 72 hours.  Invalid input(s): FREET3 Anemia work up No results for input(s): VITAMINB12, FOLATE, FERRITIN, TIBC, IRON, RETICCTPCT in the last 72 hours. Urinalysis    Component Value Date/Time   COLORURINE YELLOW (A) 03/13/2020 1610   APPEARANCEUR CLOUDY (A) 03/13/2020 1610   APPEARANCEUR Clear 07/13/2014 0933   LABSPEC 1.009 03/13/2020 1610   LABSPEC 1.005 07/13/2014 0933   PHURINE 7.0 03/13/2020 1610   GLUCOSEU NEGATIVE 03/13/2020 1610   GLUCOSEU Negative 07/13/2014 0933   HGBUR MODERATE (A) 03/13/2020 1610   BILIRUBINUR NEGATIVE 03/13/2020 1610   BILIRUBINUR Negative 07/13/2014 0933   KETONESUR NEGATIVE 03/13/2020 1610   PROTEINUR NEGATIVE 03/13/2020 1610   UROBILINOGEN 0.2 10/01/2010 1133   NITRITE NEGATIVE 03/13/2020 1610   LEUKOCYTESUR LARGE (A) 03/13/2020 1610   LEUKOCYTESUR Trace 07/13/2014 0933   Sepsis Labs Invalid input(s): PROCALCITONIN,  WBC,  LACTICIDVEN Microbiology Recent Results (from the past 240 hour(s))  SARS Coronavirus 2 by RT PCR (hospital order, performed in [facility] hospital lab) Nasopharyngeal Nasopharyngeal Swab     Status: None   Collection Time: 03/08/20  4:47 PM   Specimen: Nasopharyngeal Swab  Result Value Ref Range Status   SARS Coronavirus 2 NEGATIVE NEGATIVE Final    Comment: (NOTE) SARS-CoV-2 target nucleic acids are NOT DETECTED.  The  SARS-CoV-2 RNA is generally detectable in upper and lower respiratory specimens during the acute phase of infection. The lowest concentration of SARS-CoV-2 viral copies this assay can detect is 250 copies / mL. A negative result does not preclude SARS-CoV-2 infection and should not be used as the sole basis for treatment or other patient management decisions.  A negative result may occur with improper specimen collection / handling, submission of specimen other than nasopharyngeal swab, presence of viral mutation(s) within the areas targeted by this assay, and inadequate number of viral copies (<250 copies / mL). A negative result must be combined with clinical observations, patient history, and epidemiological information.  Fact Sheet for Patients:   BoilerBrush.com.cy  Fact Sheet for Healthcare Providers: https://pope.com/  This test is not yet approved or  cleared by the [place] FDA and has been authorized for detection and/or diagnosis of SARS-CoV-2 by FDA under an Emergency Use Authorization (EUA).  This EUA will remain in effect (meaning this test can be used) for the duration of the COVID-19  declaration under Section 564(b)(1) of the Act, 21 U.S.C. section 360bbb-3(b)(1), unless the authorization is terminated or revoked sooner.  Performed at [facility], [address], [place], [place] [address]      Time coordinating discharge: Over 30 minutes  SIGNED:   Lynn Ito, MD  [facility] 03/15/2020, 12:38 PM Pager   If 7PM-7AM, please contact night-coverage [email or web address] Password [email or web address]

## 2020-03-15 NOTE — Progress Notes (Signed)
Patient is stable and ready for discharge home. Patient's IV removed. Patient's belongings packed by NT and patient's girlfriend. Writer went over discharge paperwork with patient and girlfriend and both verbalized understanding and had no questions. Writer also provided extra honeycomb dressings per MD order d/t the amount of drainage from upper surgical wound to left hip. Patient was transported via [facility] by NT to patient's girlfriend's car.

## 2020-03-15 NOTE — Progress Notes (Signed)
Physical Therapy Treatment Patient Details Name: Perry Bullock MRN: [id number] DOB: 11-06-1960 Today's Date: 03/15/2020    History of Present Illness Perry Bullock is a 59 y.o. male with IM fixation of L intertrochanteric hip fx following fall. Past medical history significant of alcohol abuse with recurrent hospitalization, bipolar disorder, anxiety disorder, asthma, previous multiple fractures, COPD, hepatitis C, peripheral vascular disease and degenerative disc disease who has had previous right femoral neck fracture from a fall. Has been using a hurry-cane since multiple trauma fall in 2017.    PT Comments    Pt was long sitting in bed upon arriving. He reports 8/10 pain but was willing to participate. " I'm ready to go home." Pt unwilling to go to SNF at DC but feels confident he is safe to DC home with HHPT and 24 hour assistance. He was able to exit bed without assistance, stand and ambulate with RW without LOB or unsteadiness. Limited by pain mostly. Also was able to perform stair training, up/down with BUE support on rails without difficulty. Overall pt is progressing well. Did report having personal RW at home but does need 3 in 1 BSC. At conclusion of session, pt was in recliner with call bell in reach, RN aware of pt's request for pain meds, and chair alarm in place. Cleared from PT standpoint for DC home.    Follow Up Recommendations  SNF;Supervision/Assistance - 24 hour (pt is planning to DC home with 24 hour assistance.)     Equipment Recommendations  Other (comment);3in1 (PT) (pt reports having RW at home already. )    Recommendations for Other Services       Precautions / Restrictions Precautions Precautions: Fall Restrictions Weight Bearing Restrictions: Yes LLE Weight Bearing: Weight bearing as tolerated    Mobility  Bed Mobility Overal bed mobility: Needs Assistance Bed Mobility: Supine to Sit Rolling: Supervision   Supine to sit: Supervision     General  bed mobility comments: Increased time to perform. Vcs for improved technique. was able to get out of bed without physial assistance.  Transfers Overall transfer level: Needs assistance Equipment used: Rolling walker (2 wheeled) Transfers: Sit to/from Stand Sit to Stand: Supervision         General transfer comment: Increased time to perform but was able to STS without assistance. Slow moving 2/2 to pain.  Ambulation/Gait Ambulation/Gait assistance: Min guard;Supervision Gait Distance (Feet): 50 Feet Assistive device: Rolling walker (2 wheeled) Gait Pattern/deviations: Step-to pattern;Antalgic;Trunk flexed Gait velocity: decreased   General Gait Details: Pt has very slow antalgic step to pattern. did not want to ambulate > 50 ft 2/2 to pain and felt like he ambulated too far previous date. did perform stair training with CGA for safety   Stairs Stairs: Yes Stairs assistance: Min guard Stair Management: Two rails;Step to pattern;Forwards Number of Stairs: 4 General stair comments: Pt was able ro properly perform ascending descending FOS with BUE support + CGA for safety. very fatigued post stair training. pushed back to pt's room via w/c   Wheelchair Mobility    Modified Rankin (Stroke Patients Only)       Balance Overall balance assessment: Needs assistance Sitting-balance support: No upper extremity supported;Feet supported Sitting balance-Leahy Scale: Good Sitting balance - Comments: steady sitting balance   Standing balance support: Bilateral upper extremity supported;During functional activity Standing balance-Leahy Scale: Good Standing balance comment: reliant on RW for support. no LOB or unsteadiness however pt very slow moving 2/2 to pain.  Cognition Arousal/Alertness: Awake/alert Behavior During Therapy: WFL for tasks assessed/performed Overall Cognitive Status: Within Functional Limits for tasks assessed           General Comments: Pt is A and O x 4 and cooperative throughout. Eager to go home today.      Exercises Other Exercises Other Exercises: unwilling to perform ther ex at this time 2/2 to pain        Pertinent Vitals/Pain Pain Assessment: 0-10 Pain Score: 8  Faces Pain Scale: Hurts even more Pain Location: L hip Pain Descriptors / Indicators: Aching;Sore Pain Intervention(s): Limited activity within patient's tolerance;Monitored during session;Repositioned           PT Goals (current goals can now be found in the care plan section) Acute Rehab PT Goals Patient Stated Goal: go home Progress towards PT goals: Progressing toward goals    Frequency    BID      PT Plan Current plan remains appropriate    Co-evaluation     PT goals addressed during session: Mobility/safety with mobility;Proper use of DME        AM-PAC PT "6 Clicks" Mobility   Outcome Measure  Help needed turning from your back to your side while in a flat bed without using bedrails?: A Little Help needed moving from lying on your back to sitting on the side of a flat bed without using bedrails?: A Little Help needed moving to and from a bed to a chair (including a wheelchair)?: A Little Help needed standing up from a chair using your arms (e.g., wheelchair or bedside chair)?: A Little Help needed to walk in hospital room?: A Little Help needed climbing 3-5 steps with a railing? : A Little 6 Click Score: 18    End of Session Equipment Utilized During Treatment: Gait belt Activity Tolerance: Patient limited by pain;Patient tolerated treatment well Patient left: in chair;with call bell/phone within reach;with chair alarm set Nurse Communication: Mobility status;Patient requests pain meds PT Visit Diagnosis: Unsteadiness on feet (R26.81);Muscle weakness (generalized) (M62.81);Other abnormalities of gait and mobility (R26.89);Repeated falls (R29.6);Difficulty in walking, not elsewhere classified  (R26.2);History of falling (Z91.81);Pain Pain - Right/Left: Left Pain - part of body: Hip     Time: 4098-1191 PT Time Calculation (min) (ACUTE ONLY): 28 min  Charges:  $Gait Training: 8-22 mins $Therapeutic Activity: 8-22 mins                     Jetta Lout PTA 03/15/20, 9:26 AM

## 2020-03-16 LAB — URINE CULTURE: Culture: >=100000 — AB

## 2020-03-20 ENCOUNTER — Other Ambulatory Visit: Payer: Self-pay | Admitting: Adult Health

## 2020-03-20 DIAGNOSIS — F17219 Nicotine dependence, cigarettes, with unspecified nicotine-induced disorders: Secondary | ICD-10-CM

## 2020-03-20 NOTE — Telephone Encounter (Signed)
Can you review and see if pt can get prescription for nicotine patches

## 2020-03-26 ENCOUNTER — Telehealth: Payer: Self-pay

## 2020-03-26 ENCOUNTER — Other Ambulatory Visit: Payer: Self-pay | Admitting: Adult Health

## 2020-03-26 DIAGNOSIS — J449 Chronic obstructive pulmonary disease, unspecified: Secondary | ICD-10-CM

## 2020-03-26 NOTE — Telephone Encounter (Signed)
LMOM for ov on 9/29 °

## 2020-03-28 ENCOUNTER — Ambulatory Visit: Payer: Medicaid Other | Admitting: Internal Medicine

## 2020-04-02 ENCOUNTER — Other Ambulatory Visit: Payer: Self-pay | Admitting: Adult Health

## 2020-04-02 ENCOUNTER — Other Ambulatory Visit: Payer: Self-pay

## 2020-04-02 DIAGNOSIS — F17219 Nicotine dependence, cigarettes, with unspecified nicotine-induced disorders: Secondary | ICD-10-CM

## 2020-04-02 DIAGNOSIS — J449 Chronic obstructive pulmonary disease, unspecified: Secondary | ICD-10-CM

## 2020-04-02 MED ORDER — PROAIR HFA 108 (90 BASE) MCG/ACT IN AERS: INHALATION_SPRAY | RESPIRATORY_TRACT | 3 refills | Status: DC

## 2020-04-08 ENCOUNTER — Other Ambulatory Visit: Payer: Self-pay | Admitting: Adult Health

## 2020-04-08 DIAGNOSIS — J449 Chronic obstructive pulmonary disease, unspecified: Secondary | ICD-10-CM

## 2020-04-09 NOTE — Telephone Encounter (Signed)
Pt need appt for refills  ?

## 2020-04-25 ENCOUNTER — Ambulatory Visit: Payer: Medicaid Other | Admitting: Internal Medicine

## 2020-04-25 ENCOUNTER — Other Ambulatory Visit: Payer: Self-pay

## 2020-04-25 DIAGNOSIS — R0602 Shortness of breath: Secondary | ICD-10-CM | POA: Diagnosis not present

## 2020-04-25 DIAGNOSIS — J449 Chronic obstructive pulmonary disease, unspecified: Secondary | ICD-10-CM

## 2020-04-25 LAB — PULMONARY FUNCTION TEST

## 2020-04-29 NOTE — Procedures (Signed)
[facility] [address] [place], [address]  DATE OF SERVICE: April 25, 2020  Complete Pulmonary Function Testing Interpretation:  FINDINGS:  The forced vital capacity is moderately decreased FEV1 was 1.32 L which is 34% of predicted and severely decreased.  FEV1 FVC ratio is severely decreased.  Postbronchodilator no significant improvement in FEV1 however clinical improvement may still occur.  Total lung capacity is mildly decreased.  Residual volume is increased.  Residual until lung capacity ratio is increased.  FRC is increased.  The DLCO is severely decreased.  IMPRESSION:  Pulmonary function study is consistent with severe obstructive lung disease and mild restrictive lung disease.  Yevonne Pax, MD [facility] Pulmonary Critical Care Medicine Sleep Medicine

## 2020-05-01 ENCOUNTER — Ambulatory Visit: Payer: Medicaid Other

## 2020-05-08 ENCOUNTER — Other Ambulatory Visit: Payer: Self-pay

## 2020-05-08 ENCOUNTER — Ambulatory Visit: Payer: Medicaid Other | Admitting: Internal Medicine

## 2020-05-08 ENCOUNTER — Ambulatory Visit: Payer: Medicaid Other

## 2020-05-08 ENCOUNTER — Encounter: Payer: Self-pay | Admitting: Internal Medicine

## 2020-05-08 DIAGNOSIS — G4734 Idiopathic sleep related nonobstructive alveolar hypoventilation: Secondary | ICD-10-CM | POA: Diagnosis not present

## 2020-05-08 DIAGNOSIS — F17219 Nicotine dependence, cigarettes, with unspecified nicotine-induced disorders: Secondary | ICD-10-CM | POA: Diagnosis not present

## 2020-05-08 DIAGNOSIS — J449 Chronic obstructive pulmonary disease, unspecified: Secondary | ICD-10-CM | POA: Diagnosis not present

## 2020-05-08 MED ORDER — NICOTINE 21 MG/24HR TD PT24: 21.0000 mg | MEDICATED_PATCH | Freq: Every day | TRANSDERMAL | 2 refills | Status: DC

## 2020-05-08 MED ORDER — ALBUTEROL SULFATE (2.5 MG/3ML) 0.083% IN NEBU: 2.5000 mg | INHALATION_SOLUTION | Freq: Four times a day (QID) | RESPIRATORY_TRACT | 2 refills | Status: DC | PRN

## 2020-05-08 MED ORDER — PROAIR HFA 108 (90 BASE) MCG/ACT IN AERS: INHALATION_SPRAY | RESPIRATORY_TRACT | 3 refills | Status: DC

## 2020-05-08 MED ORDER — SPIRIVA HANDIHALER 18 MCG IN CAPS: ORAL_CAPSULE | RESPIRATORY_TRACT | 0 refills | Status: DC

## 2020-05-08 MED ORDER — DULERA 200-5 MCG/ACT IN AERO: 2.0000 | INHALATION_SPRAY | Freq: Two times a day (BID) | RESPIRATORY_TRACT | 0 refills | Status: DC

## 2020-05-08 NOTE — Progress Notes (Signed)
[facility] [address] [place], [place] [address]  Pulmonary Sleep Medicine   Office Visit Note  Patient Name: Perry Bullock DOB: April 29, 1961 MRN [id number]  Date of Service: 05/10/2020  Complaints/HPI: Patient is here for routine pulmonary follow-up Followed for asthma/COPD and seasonal allergies, no acute issues with his breathing Was recently hospitalized for hip replacement due to a fall--currently in a wheelchair Continues to wear 2LPM via [place] at night due to nocturnal hypoxia PFT 10/27--FEV1 1.32L, 34% predicted, DLCO severely decreased, severe obstructive lung disease and mild restrictive lung disease Requesting refills of inhalers and nebulizer medicine Also requesting refill of nicotine patches--he was given these during his recent hospital stay and felt as though they were helpful at decreasing his cravings for a cigarette  ROS  General: (-) fever, (-) chills, (-) night sweats, (-) weakness Skin: (-) rashes, (-) itching,. Eyes: (-) visual changes, (-) redness, (-) itching. Nose and Sinuses: (-) nasal stuffiness or itchiness, (-) postnasal drip, (-) nosebleeds, (-) sinus trouble. Mouth and Throat: (-) sore throat, (-) hoarseness. Neck: (-) swollen glands, (-) enlarged thyroid, (-) neck pain. Respiratory: - cough, (-) bloody sputum, + shortness of breath, + wheezing. Cardiovascular: - ankle swelling, (-) chest pain. Lymphatic: (-) lymph node enlargement. Neurologic: (-) numbness, (-) tingling. Psychiatric: (-) anxiety, (-) depression   Current Medication: Outpatient Encounter Medications as of 05/08/2020  Medication Sig  . albuterol (PROVENTIL) (2.5 MG/3ML) 0.083% nebulizer solution Take 3 mLs (2.5 mg total) by nebulization every 6 (six) hours as needed for wheezing or shortness of breath.  . allopurinol (ZYLOPRIM) 100 MG tablet Take 1 tablet (100 mg total) by mouth daily.  Marland Kitchen aspirin EC 325 MG tablet Take 1 tablet (325 mg total) by mouth 2 (two) times  daily.  . cetirizine (ZYRTEC) 10 MG tablet Take 10 mg by mouth daily.  Marland Kitchen docusate sodium (COLACE) 100 MG capsule Take 1 capsule (100 mg total) by mouth 2 (two) times daily.  . fluticasone (FLONASE) 50 MCG/ACT nasal spray Place 2 sprays into both nostrils as needed.   . folic acid (FOLVITE) 1 MG tablet Take 1 tablet (1 mg total) by mouth daily.  Marland Kitchen gabapentin (NEURONTIN) 600 MG tablet Take 600 mg by mouth 3 (three) times daily.   . iron polysaccharides (NIFEREX) 150 MG capsule Take 1 capsule (150 mg total) by mouth daily.  Marland Kitchen lidocaine (LIDODERM) 5 % Place 1 patch onto the skin daily. Remove & Discard patch within 12 hours or as directed by MD  . methocarbamol (ROBAXIN) 500 MG tablet Take 1 tablet (500 mg total) by mouth 2 (two) times daily as needed for muscle spasms.  . mometasone-formoterol (DULERA) 200-5 MCG/ACT AERO Inhale 2 puffs into the lungs 2 (two) times daily.  . Multiple Vitamin (MULTIVITAMIN WITH MINERALS) TABS tablet Take 1 tablet by mouth daily.  Marland Kitchen oxyCODONE-acetaminophen (PERCOCET/ROXICET) 5-325 MG tablet Take 1 tablet by mouth every 4 (four) hours as needed for moderate pain.  . pantoprazole (PROTONIX) 40 MG tablet Take 1 tablet (40 mg total) by mouth daily.  Marland Kitchen PROAIR HFA 108 (90 Base) MCG/ACT inhaler INHALE 2 PUFFS INTO LUNGS EVERY 6 HOURS AS NEEDED FOR WHEEZING OR SHORTNESS OF BREATH  . thiamine 100 MG tablet Take 1 tablet (100 mg total) by mouth daily.  Marland Kitchen tiotropium (SPIRIVA HANDIHALER) 18 MCG inhalation capsule Inhale the content of one capsule daily  . [DISCONTINUED] albuterol (PROVENTIL) (2.5 MG/3ML) 0.083% nebulizer solution Take 3 mLs (2.5 mg total) by nebulization every 6 (six)  hours as needed for wheezing or shortness of breath.  . [DISCONTINUED] DULERA 200-5 MCG/ACT AERO Inhale 2 puffs by mouth twice daily  . [DISCONTINUED] PROAIR HFA 108 (90 Base) MCG/ACT inhaler INHALE 2 PUFFS INTO LUNGS EVERY 6 HOURS AS NEEDED FOR WHEEZING OR SHORTNESS OF BREATH  . [DISCONTINUED]  SPIRIVA HANDIHALER 18 MCG inhalation capsule INHALE THE CONTENTS OF ONE CAPSULE BY MOUTH ONCE DAILY  . nicotine (NICODERM CQ - DOSED IN MG/24 HOURS) 21 mg/24hr patch Place 1 patch (21 mg total) onto the skin daily.   No facility-administered encounter medications on file as of 05/08/2020.    Surgical History: Past Surgical History:  Procedure Laterality Date  . EYE SURGERY Right   . INTRAMEDULLARY (IM) NAIL INTERTROCHANTERIC Right 01/16/2017   Procedure: INTRAMEDULLARY (IM) NAIL INTERTROCHANTRIC;  Surgeon: Christena Flake, MD;  Location: [facility] ORS;  Service: Orthopedics;  Laterality: Right;  . INTRAMEDULLARY (IM) NAIL INTERTROCHANTERIC Left 03/09/2020   Procedure: INTRAMEDULLARY (IM) NAIL INTERTROCHANTRIC;  Surgeon: Juanell Fairly, MD;  Location: [facility] ORS;  Service: Orthopedics;  Laterality: Left;  . INTUBATION-ENDOTRACHEAL WITH TRACHEOSTOMY STANDBY N/A 04/22/2015   Procedure: INTUBATION-ENDOTRACHEAL WITH TRACHEOSTOMY STANDBY;  Surgeon: Linus Salmons, MD;  Location: [facility] ORS;  Service: ENT;  Laterality: N/A;  . JOINT REPLACEMENT Left    Lt shoulder  . MANDIBLE SURGERY      Medical History: Past Medical History:  Diagnosis Date  . Anxiety   . Arthritis   . Asthma   . Bipolar 1 disorder (HCC)   . Blind right eye   . Broken hip (HCC)   . Chronic back pain    Diverticulosis  . Chronic headaches   . Convulsion (HCC)    once a month when stands up too quickly  . COPD (chronic obstructive pulmonary disease) (HCC)   . DDD (degenerative disc disease), lumbar   . Diverticulitis   . GERD (gastroesophageal reflux disease)   . Glaucoma   . Hepatitis    hepatitis C  . Pneumonia   . PVD (peripheral vascular disease) (HCC)    lower extremities reddened and feet swollen  . Seasonal allergies   . Shortness of breath dyspnea     Family History: Family History  Problem Relation Age of Onset  . Breast cancer Mother   . Hypertension Father   . Diabetes Father     Social  History: Social History   Socioeconomic History  . Marital status: Single    Spouse name: Not on file  . Number of children: Not on file  . Years of education: Not on file  . Highest education level: Not on file  Occupational History  . Not on file  Tobacco Use  . Smoking status: Current Every Day Smoker    Packs/day: 0.00    Years: 35.00    Pack years: 0.00    Types: Cigarettes  . Smokeless tobacco: Never Used  . Tobacco comment: 4 cigs daily--02/27/16, pt has patch  Substance and Sexual Activity  . Alcohol use: Yes    Alcohol/week: 3.0 standard drinks    Types: 3 Cans of beer per week    Comment: pt is cutting back to 3-4 daily  . Drug use: No  . Sexual activity: Yes    Birth control/protection: None  Other Topics Concern  . Not on file  Social History Narrative  . Not on file   Social Determinants of Health   Financial Resource Strain:   . Difficulty of Paying Living Expenses: Not on file  Food Insecurity:   . Worried About Programme researcher, broadcasting/film/video in the Last Year: Not on file  . Ran Out of Food in the Last Year: Not on file  Transportation Needs:   . Lack of Transportation (Medical): Not on file  . Lack of Transportation (Non-Medical): Not on file  Physical Activity:   . Days of Exercise per Week: Not on file  . Minutes of Exercise per Session: Not on file  Stress:   . Feeling of Stress : Not on file  Social Connections:   . Frequency of Communication with Friends and Family: Not on file  . Frequency of Social Gatherings with Friends and Family: Not on file  . Attends Religious Services: Not on file  . Active Member of Clubs or Organizations: Not on file  . Attends Banker Meetings: Not on file  . Marital Status: Not on file  Intimate Partner Violence:   . Fear of Current or Ex-Partner: Not on file  . Emotionally Abused: Not on file  . Physically Abused: Not on file  . Sexually Abused: Not on file    Vital Signs: Blood pressure (!) 170/60,  pulse (!) 112, temperature 98.3 F (36.8 C), resp. rate 16, weight 140 lb (63.5 kg), SpO2 91 %.  Examination: General Appearance: The patient is well-developed, well-nourished, and in no distress. Skin: Gross inspection of skin unremarkable. Head: normocephalic, no gross deformities. Eyes: no gross deformities noted. ENT: ears appear grossly normal no exudates. Neck: Supple. No thyromegaly. No LAD. Respiratory: Bilateral wheezing most prominent in bases, no rhonchi or rales noted. Cardiovascular: Normal S1 and S2 without murmur or rub. Extremities: No cyanosis. pulses are equal. Neurologic: Alert and oriented. No involuntary movements.  LABS: Recent Results (from the past 2160 hour(s))  SARS Coronavirus 2 by RT PCR (hospital order, performed in [facility] hospital lab) Nasopharyngeal Nasopharyngeal Swab     Status: None   Collection Time: 03/08/20  4:47 PM   Specimen: Nasopharyngeal Swab  Result Value Ref Range   SARS Coronavirus 2 NEGATIVE NEGATIVE    Comment: (NOTE) SARS-CoV-2 target nucleic acids are NOT DETECTED.  The SARS-CoV-2 RNA is generally detectable in upper and lower respiratory specimens during the acute phase of infection. The lowest concentration of SARS-CoV-2 viral copies this assay can detect is 250 copies / mL. A negative result does not preclude SARS-CoV-2 infection and should not be used as the sole basis for treatment or other patient management decisions.  A negative result may occur with improper specimen collection / handling, submission of specimen other than nasopharyngeal swab, presence of viral mutation(s) within the areas targeted by this assay, and inadequate number of viral copies (<250 copies / mL). A negative result must be combined with clinical observations, patient history, and epidemiological information.  Fact Sheet for Patients:   BoilerBrush.com.cy  Fact Sheet for Healthcare  Providers: https://pope.com/  This test is not yet approved or  cleared by the [place] FDA and has been authorized for detection and/or diagnosis of SARS-CoV-2 by FDA under an Emergency Use Authorization (EUA).  This EUA will remain in effect (meaning this test can be used) for the duration of the COVID-19 declaration under Section 564(b)(1) of the Act, 21 U.S.C. section 360bbb-3(b)(1), unless the authorization is terminated or revoked sooner.  Performed at [facility], [address], [place], [place] [address]   CBC with Differential     Status: Abnormal   Collection Time: 03/08/20  5:40 PM  Result Value  Ref Range   WBC 9.0 4.0 - 10.5 K/uL   RBC 3.57 (L) 4.22 - 5.81 MIL/uL   Hemoglobin 12.1 (L) 13.0 - 17.0 g/dL   HCT 40.9 (L) 39 - 52 %   MCV 98.6 80.0 - 100.0 fL   MCH 33.9 26.0 - 34.0 pg   MCHC 34.4 30.0 - 36.0 g/dL   RDW 81.1 91.4 - 78.2 %   Platelets 199 150 - 400 K/uL   nRBC 0.0 0.0 - 0.2 %   Neutrophils Relative % 70 %   Neutro Abs 6.3 1.7 - 7.7 K/uL   Lymphocytes Relative 19 %   Lymphs Abs 1.7 0.7 - 4.0 K/uL   Monocytes Relative 7 %   Monocytes Absolute 0.6 0.1 - 1.0 K/uL   Eosinophils Relative 3 %   Eosinophils Absolute 0.3 0.0 - 0.5 K/uL   Basophils Relative 1 %   Basophils Absolute 0.1 0.0 - 0.1 K/uL   Immature Granulocytes 0 %   Abs Immature Granulocytes 0.03 0.00 - 0.07 K/uL    Comment: Performed at [facility], [address] [place], [place] [address]  Basic metabolic panel     Status: Abnormal   Collection Time: 03/08/20  5:40 PM  Result Value Ref Range   Sodium 140 135 - 145 mmol/L   Potassium 3.7 3.5 - 5.1 mmol/L    Comment: HEMOLYSIS AT THIS LEVEL MAY AFFECT RESULT   Chloride 105 98 - 111 mmol/L   CO2 22 22 - 32 mmol/L   Glucose, Bld 89 70 - 99 mg/dL    Comment: Glucose reference range applies only to samples taken after fasting for at least 8 hours.   BUN 7 6 - 20 mg/dL   Creatinine, Ser 3.08  0.61 - 1.24 mg/dL   Calcium 8.4 (L) 8.9 - 10.3 mg/dL   GFR calc non Af Amer >60 >60 mL/min   GFR calc Af Amer >60 >60 mL/min   Anion gap 13 5 - 15    Comment: Performed at [facility], [address] [place], [place] [address]  Protime-INR     Status: None   Collection Time: 03/08/20  5:40 PM  Result Value Ref Range   Prothrombin Time 12.5 11.4 - 15.2 seconds   INR 1.0 0.8 - 1.2    Comment: (NOTE) INR goal varies based on device and disease states. Performed at [facility], [address] [place], [place] [address]   APTT     Status: None   Collection Time: 03/08/20  5:40 PM  Result Value Ref Range   aPTT 28 24 - 36 seconds    Comment: Performed at [facility], [address] [place], [place] [address]  Ethanol     Status: Abnormal   Collection Time: 03/08/20  5:40 PM  Result Value Ref Range   Alcohol, Ethyl (B) 239 (H) <10 mg/dL    Comment: (NOTE) Lowest detectable limit for serum alcohol is 10 mg/dL.  For medical purposes only. Performed at [facility], [address] [place], [place] [address]   HIV Antibody (routine testing w rflx)     Status: None   Collection Time: 03/08/20  9:32 PM  Result Value Ref Range   HIV Screen 4th Generation wRfx Non Reactive Non Reactive    Comment: Performed at [facility], [address], [place], [place] [address]  Comprehensive metabolic panel     Status: Abnormal   Collection Time: 03/08/20  9:32 PM  Result Value Ref Range   Sodium 142 135 - 145 mmol/L   Potassium 3.5 3.5 - 5.1 mmol/L   Chloride 106 98 - 111 mmol/L   CO2 24 22 - 32 mmol/L   Glucose, Bld 84 70 - 99 mg/dL    Comment: Glucose reference range applies only to samples taken after fasting for at least 8 hours.   BUN 7 6 - 20 mg/dL   Creatinine, Ser 8.29 0.61 - 1.24 mg/dL   Calcium 8.2 (L) 8.9 - 10.3 mg/dL   Total Protein 6.6 6.5 - 8.1 g/dL   Albumin 3.4 (L) 3.5 - 5.0 g/dL   AST 65 (H) 15 - 41 U/L   ALT 27 0 - 44  U/L   Alkaline Phosphatase 114 38 - 126 U/L   Total Bilirubin 1.0 0.3 - 1.2 mg/dL   GFR calc non Af Amer >60 >60 mL/min   GFR calc Af Amer >60 >60 mL/min   Anion gap 12 5 - 15    Comment: Performed at [facility], [address] [place], [place] [address]  Magnesium     Status: Abnormal   Collection Time: 03/08/20  9:32 PM  Result Value Ref Range   Magnesium 1.4 (L) 1.7 - 2.4 mg/dL    Comment: Performed at [facility], [address] [place], [place] [address]  Phosphorus     Status: None   Collection Time: 03/08/20  9:32 PM  Result Value Ref Range   Phosphorus 4.4 2.5 - 4.6 mg/dL    Comment: Performed at [facility], [address] [place], [place] [address]  CBC     Status: Abnormal   Collection Time: 03/08/20  9:32 PM  Result Value Ref Range   WBC 10.6 (H) 4.0 - 10.5 K/uL   RBC 3.56 (L) 4.22 - 5.81 MIL/uL   Hemoglobin 11.9 (L) 13.0 - 17.0 g/dL   HCT 29.5 (L) 39 - 52 %   MCV 102.0 (H) 80.0 - 100.0 fL   MCH 33.4 26.0 - 34.0 pg   MCHC 32.8 30.0 - 36.0 g/dL   RDW 28.4 13.2 - 44.0 %   Platelets 196 150 - 400 K/uL   nRBC 0.0 0.0 - 0.2 %    Comment: Performed at [facility], [address] [place], [place] [address]  CBC     Status: Abnormal   Collection Time: 03/09/20  4:37 AM  Result Value Ref Range   WBC 9.0 4.0 - 10.5 K/uL   RBC 3.56 (L) 4.22 - 5.81 MIL/uL   Hemoglobin 12.4 (L) 13.0 - 17.0 g/dL   HCT 53.6 (L) 39 - 52 %   MCV 102.0 (H) 80.0 - 100.0 fL   MCH 34.8 (H) 26.0 - 34.0 pg   MCHC 34.2 30.0 - 36.0 g/dL   RDW 64.4 03.4 - 74.2 %   Platelets 157 150 - 400 K/uL   nRBC 0.0 0.0 - 0.2 %    Comment: Performed at [facility], [address] [place], [place] [address]  Comprehensive metabolic panel     Status: Abnormal   Collection Time: 03/09/20  6:49 AM  Result Value Ref Range   Sodium 140 135 - 145 mmol/L   Potassium 4.2 3.5 - 5.1 mmol/L   Chloride 108 98 - 111 mmol/L   CO2 23 22 - 32 mmol/L   Glucose, Bld  123 (H) 70 - 99 mg/dL    Comment: Glucose reference range applies only to samples taken  after fasting for at least 8 hours.   BUN 7 6 - 20 mg/dL   Creatinine, Ser 1.610.61 0.61 - 1.24 mg/dL   Calcium 8.2 (L) 8.9 - 10.3 mg/dL   Total Protein 6.1 (L) 6.5 - 8.1 g/dL   Albumin 3.2 (L) 3.5 - 5.0 g/dL   AST 65 (H) 15 - 41 U/L   ALT 28 0 - 44 U/L   Alkaline Phosphatase 113 38 - 126 U/L   Total Bilirubin 1.6 (H) 0.3 - 1.2 mg/dL   GFR calc non Af Amer >60 >60 mL/min   GFR calc Af Amer >60 >60 mL/min   Anion gap 9 5 - 15    Comment: Performed at [facility], [address] [place], [place] [address]  Urine Drug Screen, Qualitative ([facility] only)     Status: Abnormal   Collection Time: 03/09/20 10:25 AM  Result Value Ref Range   Tricyclic, Ur Screen POSITIVE (A) NONE DETECTED   Amphetamines, Ur Screen NONE DETECTED NONE DETECTED   MDMA (Ecstasy)Ur Screen NONE DETECTED NONE DETECTED   Cocaine Metabolite,Ur [place] NONE DETECTED NONE DETECTED   Opiate, Ur Screen POSITIVE (A) NONE DETECTED   Phencyclidine (PCP) Ur S NONE DETECTED NONE DETECTED   Cannabinoid 50 Ng, Ur [place] NONE DETECTED NONE DETECTED   Barbiturates, Ur Screen NONE DETECTED NONE DETECTED   Benzodiazepine, Ur Scrn NONE DETECTED NONE DETECTED   Methadone Scn, Ur NONE DETECTED NONE DETECTED    Comment: (NOTE) Tricyclics + metabolites, urine    Cutoff 1000 ng/mL Amphetamines + metabolites, urine  Cutoff 1000 ng/mL MDMA (Ecstasy), urine              Cutoff 500 ng/mL Cocaine Metabolite, urine          Cutoff 300 ng/mL Opiate + metabolites, urine        Cutoff 300 ng/mL Phencyclidine (PCP), urine         Cutoff 25 ng/mL Cannabinoid, urine                 Cutoff 50 ng/mL Barbiturates + metabolites, urine  Cutoff 200 ng/mL Benzodiazepine, urine              Cutoff 200 ng/mL Methadone, urine                   Cutoff 300 ng/mL  The urine drug screen provides only a preliminary, unconfirmed analytical test result and should not be used  for non-medical purposes. Clinical consideration and professional judgment should be applied to any positive drug screen result due to possible interfering substances. A more specific alternate chemical method must be used in order to obtain a confirmed analytical result. Gas chromatography / mass spectrometry (GC/MS) is the preferred confirm atory method. Performed at [facility], [address] [place], [place] [id number]   Glucose, capillary     Status: Abnormal   Collection Time: 03/09/20 11:52 PM  Result Value Ref Range   Glucose-Capillary 138 (H) 70 - 99 mg/dL    Comment: Glucose reference range applies only to samples taken after fasting for at least 8 hours.   Comment 1 Notify RN   Basic metabolic panel     Status: Abnormal   Collection Time: 03/10/20  4:26 AM  Result Value Ref Range   Sodium 136 135 - 145 mmol/L   Potassium 3.2 (L) 3.5 - 5.1 mmol/L   Chloride 106 98 - 111 mmol/L   CO2 23 22 - 32 mmol/L  Glucose, Bld 110 (H) 70 - 99 mg/dL    Comment: Glucose reference range applies only to samples taken after fasting for at least 8 hours.   BUN 7 6 - 20 mg/dL   Creatinine, Ser 8.92 0.61 - 1.24 mg/dL   Calcium 7.6 (L) 8.9 - 10.3 mg/dL   GFR calc non Af Amer >60 >60 mL/min   GFR calc Af Amer >60 >60 mL/min   Anion gap 7 5 - 15    Comment: Performed at [facility], [address] [place], [place] [address]  Magnesium     Status: Abnormal   Collection Time: 03/10/20  4:26 AM  Result Value Ref Range   Magnesium 1.5 (L) 1.7 - 2.4 mg/dL    Comment: Performed at [facility], [address] [place], [place] [address]  CBC with Differential/Platelet     Status: Abnormal   Collection Time: 03/10/20  4:26 AM  Result Value Ref Range   WBC 5.7 4.0 - 10.5 K/uL   RBC 2.33 (L) 4.22 - 5.81 MIL/uL   Hemoglobin 8.0 (L) 13.0 - 17.0 g/dL    Comment: REPEATED TO VERIFY   HCT 22.8 (L) 39 - 52 %   MCV 97.9 80.0 - 100.0 fL   MCH 34.3 (H) 26.0 - 34.0 pg    MCHC 35.1 30.0 - 36.0 g/dL   RDW 44.8 18.5 - 63.1 %   Platelets 107 (L) 150 - 400 K/uL    Comment: Immature Platelet Fraction may be clinically indicated, consider ordering this additional test [id number]    nRBC 0.0 0.0 - 0.2 %   Neutrophils Relative % 65 %   Neutro Abs 3.7 1.7 - 7.7 K/uL   Lymphocytes Relative 19 %   Lymphs Abs 1.1 0.7 - 4.0 K/uL   Monocytes Relative 12 %   Monocytes Absolute 0.7 0.1 - 1.0 K/uL   Eosinophils Relative 3 %   Eosinophils Absolute 0.2 0.0 - 0.5 K/uL   Basophils Relative 1 %   Basophils Absolute 0.0 0.0 - 0.1 K/uL   WBC Morphology MORPHOLOGY UNREMARKABLE    RBC Morphology MORPHOLOGY UNREMARKABLE    Smear Review Normal platelet morphology    Immature Granulocytes 0 %   Abs Immature Granulocytes 0.01 0.00 - 0.07 K/uL    Comment: Performed at [facility], [address] [place], [place] [address]  CBC with Differential/Platelet     Status: Abnormal   Collection Time: 03/11/20  4:33 AM  Result Value Ref Range   WBC 7.1 4.0 - 10.5 K/uL   RBC 2.40 (L) 4.22 - 5.81 MIL/uL   Hemoglobin 8.1 (L) 13.0 - 17.0 g/dL   HCT 02.7 (L) 39 - 52 %   MCV 102.5 (H) 80.0 - 100.0 fL   MCH 33.8 26.0 - 34.0 pg   MCHC 32.9 30.0 - 36.0 g/dL   RDW 74.1 28.7 - 86.7 %   Platelets 108 (L) 150 - 400 K/uL    Comment: Immature Platelet Fraction may be clinically indicated, consider ordering this additional test [id number]    nRBC 0.0 0.0 - 0.2 %   Neutrophils Relative % 70 %   Neutro Abs 4.9 1.7 - 7.7 K/uL   Lymphocytes Relative 16 %   Lymphs Abs 1.1 0.7 - 4.0 K/uL   Monocytes Relative 9 %   Monocytes Absolute 0.6 0.1 - 1.0 K/uL   Eosinophils Relative 5 %   Eosinophils Absolute 0.4 0.0 - 0.5 K/uL   Basophils Relative 0 %  Basophils Absolute 0.0 0.0 - 0.1 K/uL   Immature Granulocytes 0 %   Abs Immature Granulocytes 0.03 0.00 - 0.07 K/uL   Abnormal Lymphocytes Present PRESENT     Comment: Performed at [facility], [address]  [place], [place] [address]  Basic metabolic panel     Status: Abnormal   Collection Time: 03/11/20  4:33 AM  Result Value Ref Range   Sodium 136 135 - 145 mmol/L   Potassium 5.5 (H) 3.5 - 5.1 mmol/L   Chloride 107 98 - 111 mmol/L   CO2 22 22 - 32 mmol/L   Glucose, Bld 148 (H) 70 - 99 mg/dL    Comment: Glucose reference range applies only to samples taken after fasting for at least 8 hours.   BUN 9 6 - 20 mg/dL   Creatinine, Ser 6.04 0.61 - 1.24 mg/dL   Calcium 8.1 (L) 8.9 - 10.3 mg/dL   GFR calc non Af Amer >60 >60 mL/min   GFR calc Af Amer >60 >60 mL/min   Anion gap 7 5 - 15    Comment: Performed at [facility], [address] [place], [place] [address]  Magnesium     Status: None   Collection Time: 03/11/20  4:33 AM  Result Value Ref Range   Magnesium 1.7 1.7 - 2.4 mg/dL    Comment: Performed at [facility], [address] [place], [place] [address]  Potassium     Status: None   Collection Time: 03/11/20 12:10 PM  Result Value Ref Range   Potassium 4.5 3.5 - 5.1 mmol/L    Comment: Performed at [facility], [address] [place], [place] [address]  CBC with Differential/Platelet     Status: Abnormal   Collection Time: 03/12/20  2:43 AM  Result Value Ref Range   WBC 9.3 4.0 - 10.5 K/uL   RBC 2.60 (L) 4.22 - 5.81 MIL/uL   Hemoglobin 8.7 (L) 13.0 - 17.0 g/dL   HCT 62.1 (L) 39 - 52 %   MCV 101.9 (H) 80.0 - 100.0 fL   MCH 33.5 26.0 - 34.0 pg   MCHC 32.8 30.0 - 36.0 g/dL   RDW 30.8 65.7 - 84.6 %   Platelets 145 (L) 150 - 400 K/uL   nRBC 0.0 0.0 - 0.2 %   Neutrophils Relative % 67 %   Neutro Abs 6.3 1.7 - 7.7 K/uL   Lymphocytes Relative 17 %   Lymphs Abs 1.6 0.7 - 4.0 K/uL   Monocytes Relative 9 %   Monocytes Absolute 0.8 0.1 - 1.0 K/uL   Eosinophils Relative 6 %   Eosinophils Absolute 0.6 (H) 0.0 - 0.5 K/uL   Basophils Relative 0 %   Basophils Absolute 0.0 0.0 - 0.1 K/uL   Immature Granulocytes 1 %   Abs Immature Granulocytes 0.05 0.00 -  0.07 K/uL    Comment: Performed at [facility], [address] [place], [place] [address]  Basic metabolic panel     Status: Abnormal   Collection Time: 03/12/20  2:43 AM  Result Value Ref Range   Sodium 137 135 - 145 mmol/L   Potassium 4.7 3.5 - 5.1 mmol/L   Chloride 101 98 - 111 mmol/L   CO2 27 22 - 32 mmol/L   Glucose, Bld 131 (H) 70 - 99 mg/dL    Comment: Glucose reference range applies only to samples taken after fasting for at least 8 hours.   BUN 9 6 - 20 mg/dL  Creatinine, Ser 0.58 (L) 0.61 - 1.24 mg/dL   Calcium 8.7 (L) 8.9 - 10.3 mg/dL   GFR calc non Af Amer >60 >60 mL/min   GFR calc Af Amer >60 >60 mL/min   Anion gap 9 5 - 15    Comment: Performed at [facility], [address], [place], [place] [address]  Magnesium     Status: Abnormal   Collection Time: 03/12/20  2:43 AM  Result Value Ref Range   Magnesium 1.4 (L) 1.7 - 2.4 mg/dL    Comment: Performed at [facility], [address] [place], [place] [address]  Iron and TIBC     Status: Abnormal   Collection Time: 03/12/20  2:43 AM  Result Value Ref Range   Iron 29 (L) 45 - 182 ug/dL   TIBC 630 (L) 160 - 109 ug/dL   Saturation Ratios 15 (L) 17.9 - 39.5 %   UIBC 160 ug/dL    Comment: Performed at [facility], [address] [place], [place] [address]  Vitamin B12     Status: None   Collection Time: 03/12/20  2:43 AM  Result Value Ref Range   Vitamin B-12 256 180 - 914 pg/mL    Comment: (NOTE) This assay is not validated for testing neonatal or myeloproliferative syndrome specimens for Vitamin B12 levels. Performed at [facility],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

## 2020-05-10 ENCOUNTER — Ambulatory Visit: Payer: Self-pay | Admitting: Urology

## 2020-05-10 ENCOUNTER — Encounter: Payer: Self-pay | Admitting: Internal Medicine

## 2020-05-10 NOTE — Patient Instructions (Signed)
Chronic Obstructive Pulmonary Disease Chronic obstructive pulmonary disease (COPD) is a long-term (chronic) lung problem. When you have COPD, it is hard for air to get in and out of your lungs. Usually the condition gets worse over time, and your lungs will never return to normal. There are things you can do to keep yourself as healthy as possible.  Your doctor may treat your condition with: ? Medicines. ? Oxygen. ? Lung surgery.  Your doctor may also recommend: ? Rehabilitation. This includes steps to make your body work better. It may involve a team of specialists. ? Quitting smoking, if you smoke. ? Exercise and changes to your diet. ? Comfort measures (palliative care). Follow these instructions at home: Medicines  Take over-the-counter and prescription medicines only as told by your doctor.  Talk to your doctor before taking any cough or allergy medicines. You may need to avoid medicines that cause your lungs to be dry. Lifestyle  If you smoke, stop. Smoking makes the problem worse. If you need help quitting, ask your doctor.  Avoid being around things that make your breathing worse. This may include smoke, chemicals, and fumes.  Stay active, but remember to rest as well.  Learn and use tips on how to relax.  Make sure you get enough sleep. Most adults need at least 7 hours of sleep every night.  Eat healthy foods. Eat smaller meals more often. Rest before meals. Controlled breathing Learn and use tips on how to control your breathing as told by your doctor. Try:  Breathing in (inhaling) through your nose for 1 second. Then, pucker your lips and breath out (exhale) through your lips for 2 seconds.  Putting one hand on your belly (abdomen). Breathe in slowly through your nose for 1 second. Your hand on your belly should move out. Pucker your lips and breathe out slowly through your lips. Your hand on your belly should move in as you breathe out.  Controlled coughing Learn  and use controlled coughing to clear mucus from your lungs. Follow these steps: 1. Lean your head a little forward. 2. Breathe in deeply. 3. Try to hold your breath for 3 seconds. 4. Keep your mouth slightly open while coughing 2 times. 5. Spit any mucus out into a tissue. 6. Rest and do the steps again 1 or 2 times as needed. General instructions  Make sure you get all the shots (vaccines) that your doctor recommends. Ask your doctor about a flu shot and a pneumonia shot.  Use oxygen therapy and pulmonary rehabilitation if told by your doctor. If you need home oxygen therapy, ask your doctor if you should buy a tool to measure your oxygen level (oximeter).  Make a COPD action plan with your doctor. This helps you to know what to do if you feel worse than usual.  Manage any other conditions you have as told by your doctor.  Avoid going outside when it is very hot, cold, or humid.  Avoid people who have a sickness you can catch (contagious).  Keep all follow-up visits as told by your doctor. This is important. Contact a doctor if:  You cough up more mucus than usual.  There is a change in the color or thickness of the mucus.  It is harder to breathe than usual.  Your breathing is faster than usual.  You have trouble sleeping.  You need to use your medicines more often than usual.  You have trouble doing your normal activities such as getting dressed   or walking around the house. Get help right away if:  You have shortness of breath while resting.  You have shortness of breath that stops you from: ? Being able to talk. ? Doing normal activities.  Your chest hurts for longer than 5 minutes.  Your skin color is more blue than usual.  Your pulse oximeter shows that you have low oxygen for longer than 5 minutes.  You have a fever.  You feel too tired to breathe normally. Summary  Chronic obstructive pulmonary disease (COPD) is a long-term lung problem.  The way your  lungs work will never return to normal. Usually the condition gets worse over time. There are things you can do to keep yourself as healthy as possible.  Take over-the-counter and prescription medicines only as told by your doctor.  If you smoke, stop. Smoking makes the problem worse. This information is not intended to replace advice given to you by your health care provider. Make sure you discuss any questions you have with your health care provider. Document Revised: 05/29/2017 Document Reviewed: 07/21/2016 Elsevier Patient Education  2020 [employer]  

## 2020-05-11 ENCOUNTER — Telehealth: Payer: Self-pay

## 2020-05-11 NOTE — Telephone Encounter (Signed)
-----   Message from [facility] sent at 05/09/2020  9:49 AM EST -----  ----- Message ----- From: Theotis Burrow, NP Sent: 05/08/2020   3:50 PM EST To: Antonieta Iba  Can we find out who his oxygen company is? He needs a new nasal cannula as well as nebulizer tubing.

## 2020-06-25 ENCOUNTER — Other Ambulatory Visit: Payer: Self-pay | Admitting: Hospice and Palliative Medicine

## 2020-06-25 DIAGNOSIS — J449 Chronic obstructive pulmonary disease, unspecified: Secondary | ICD-10-CM

## 2020-08-08 ENCOUNTER — Telehealth: Payer: Self-pay

## 2020-08-08 ENCOUNTER — Ambulatory Visit: Payer: Medicaid Other | Admitting: Hospice and Palliative Medicine

## 2020-08-08 ENCOUNTER — Other Ambulatory Visit: Payer: Self-pay

## 2020-08-08 ENCOUNTER — Encounter: Payer: Self-pay | Admitting: Hospice and Palliative Medicine

## 2020-08-08 ENCOUNTER — Ambulatory Visit (INDEPENDENT_AMBULATORY_CARE_PROVIDER_SITE_OTHER): Payer: Medicaid Other | Admitting: Hospice and Palliative Medicine

## 2020-08-08 VITALS — BP 116/80 | HR 115 | Temp 97.4°F | Resp 16 | Ht 71.0 in | Wt 138.0 lb

## 2020-08-08 DIAGNOSIS — Z9981 Dependence on supplemental oxygen: Secondary | ICD-10-CM | POA: Diagnosis not present

## 2020-08-08 DIAGNOSIS — Z122 Encounter for screening for malignant neoplasm of respiratory organs: Secondary | ICD-10-CM

## 2020-08-08 DIAGNOSIS — J441 Chronic obstructive pulmonary disease with (acute) exacerbation: Secondary | ICD-10-CM | POA: Diagnosis not present

## 2020-08-08 DIAGNOSIS — F17219 Nicotine dependence, cigarettes, with unspecified nicotine-induced disorders: Secondary | ICD-10-CM | POA: Diagnosis not present

## 2020-08-08 DIAGNOSIS — J449 Chronic obstructive pulmonary disease, unspecified: Secondary | ICD-10-CM

## 2020-08-08 MED ORDER — PROAIR HFA 108 (90 BASE) MCG/ACT IN AERS: INHALATION_SPRAY | RESPIRATORY_TRACT | 3 refills | Status: DC

## 2020-08-08 MED ORDER — ALBUTEROL SULFATE (2.5 MG/3ML) 0.083% IN NEBU: 2.5000 mg | INHALATION_SOLUTION | Freq: Four times a day (QID) | RESPIRATORY_TRACT | 2 refills | Status: DC | PRN

## 2020-08-08 MED ORDER — SPIRIVA HANDIHALER 18 MCG IN CAPS: ORAL_CAPSULE | RESPIRATORY_TRACT | 2 refills | Status: DC

## 2020-08-08 MED ORDER — DULERA 200-5 MCG/ACT IN AERO: 2.0000 | INHALATION_SPRAY | Freq: Two times a day (BID) | RESPIRATORY_TRACT | 0 refills | Status: DC

## 2020-08-08 MED ORDER — AZITHROMYCIN 250 MG PO TABS: ORAL_TABLET | ORAL | 0 refills | Status: DC
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
## 2020-08-08 NOTE — Telephone Encounter (Signed)
Gave [employer] orders for oxygen supplies and larger O2 tanks. Ronnell Makarewicz

## 2020-08-14 ENCOUNTER — Encounter: Payer: Self-pay | Admitting: Hospice and Palliative Medicine

## 2020-08-16 ENCOUNTER — Encounter: Payer: Self-pay | Admitting: Gastroenterology

## 2020-08-16 ENCOUNTER — Telehealth: Payer: Self-pay | Admitting: *Deleted

## 2020-08-16 NOTE — Telephone Encounter (Signed)
Error

## 2020-08-16 NOTE — Telephone Encounter (Signed)
Received referral for low dose lung cancer screening CT scan. Message left at phone number listed in EMR for patient to call me back to facilitate scheduling scan.  

## 2020-08-21 ENCOUNTER — Telehealth: Payer: Self-pay | Admitting: *Deleted

## 2020-08-21 NOTE — Telephone Encounter (Signed)
Received referral for low dose lung cancer screening CT scan. Message left at phone number listed in EMR for patient to call me back to facilitate scheduling scan.  

## 2020-08-23 ENCOUNTER — Telehealth: Payer: Self-pay | Admitting: *Deleted

## 2020-08-23 DIAGNOSIS — Z122 Encounter for screening for malignant neoplasm of respiratory organs: Secondary | ICD-10-CM

## 2020-08-23 DIAGNOSIS — Z87891 Personal history of nicotine dependence: Secondary | ICD-10-CM

## 2020-08-23 DIAGNOSIS — F17200 Nicotine dependence, unspecified, uncomplicated: Secondary | ICD-10-CM

## 2020-08-23 NOTE — Telephone Encounter (Signed)
Received referral for initial lung cancer screening scan. Contacted patient and obtained smoking history,(current, 43 pack year) as well as answering questions related to screening process. Patient denies signs of lung cancer such as weight loss or hemoptysis. Patient denies comorbidity that would prevent curative treatment if lung cancer were found. Patient is scheduled for shared decision making visit and CT scan on 09/04/20 at 145pm.

## 2020-09-04 ENCOUNTER — Inpatient Hospital Stay: Payer: Medicaid Other | Admitting: Nurse Practitioner

## 2020-09-04 ENCOUNTER — Ambulatory Visit: Payer: Medicaid Other

## 2020-10-09 ENCOUNTER — Ambulatory Visit: Payer: Medicaid Other | Admitting: Gastroenterology

## 2020-11-13 ENCOUNTER — Ambulatory Visit: Payer: Medicaid Other | Admitting: Internal Medicine

## 2020-12-13 ENCOUNTER — Ambulatory Visit: Payer: Medicaid Other | Admitting: Internal Medicine

## 2021-01-02 ENCOUNTER — Other Ambulatory Visit: Payer: Self-pay

## 2021-01-02 DIAGNOSIS — J441 Chronic obstructive pulmonary disease with (acute) exacerbation: Secondary | ICD-10-CM

## 2021-01-02 MED ORDER — DULERA 200-5 MCG/ACT IN AERO: 2.0000 | INHALATION_SPRAY | Freq: Two times a day (BID) | RESPIRATORY_TRACT | 0 refills | Status: DC

## 2021-01-21 ENCOUNTER — Encounter: Payer: Self-pay | Admitting: Internal Medicine

## 2021-01-21 ENCOUNTER — Other Ambulatory Visit: Payer: Self-pay

## 2021-01-21 ENCOUNTER — Ambulatory Visit: Payer: Medicaid Other | Admitting: Internal Medicine

## 2021-01-21 VITALS — BP 102/68 | HR 110 | Temp 97.3°F | Resp 16 | Ht 72.0 in | Wt 153.4 lb

## 2021-01-21 DIAGNOSIS — J441 Chronic obstructive pulmonary disease with (acute) exacerbation: Secondary | ICD-10-CM | POA: Diagnosis not present

## 2021-01-21 DIAGNOSIS — F17219 Nicotine dependence, cigarettes, with unspecified nicotine-induced disorders: Secondary | ICD-10-CM | POA: Diagnosis not present

## 2021-01-21 DIAGNOSIS — R0602 Shortness of breath: Secondary | ICD-10-CM

## 2021-01-21 DIAGNOSIS — Z9981 Dependence on supplemental oxygen: Secondary | ICD-10-CM | POA: Diagnosis not present

## 2021-01-21 MED ORDER — SPIRIVA HANDIHALER 18 MCG IN CAPS: 18.0000 ug | ORAL_CAPSULE | Freq: Every day | RESPIRATORY_TRACT | 5 refills | Status: DC

## 2021-01-21 MED ORDER — MOMETASONE FURO-FORMOTEROL FUM 100-5 MCG/ACT IN AERO: 2.0000 | INHALATION_SPRAY | Freq: Two times a day (BID) | RESPIRATORY_TRACT | 5 refills | Status: DC

## 2021-01-21 MED ORDER — ALBUTEROL SULFATE HFA 108 (90 BASE) MCG/ACT IN AERS: 2.0000 | INHALATION_SPRAY | Freq: Four times a day (QID) | RESPIRATORY_TRACT | 5 refills | Status: DC | PRN
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
## 2021-01-21 NOTE — Patient Instructions (Signed)

## 2021-04-19 ENCOUNTER — Other Ambulatory Visit: Payer: Self-pay | Admitting: *Deleted

## 2021-04-19 DIAGNOSIS — Z87891 Personal history of nicotine dependence: Secondary | ICD-10-CM

## 2021-04-19 DIAGNOSIS — F17210 Nicotine dependence, cigarettes, uncomplicated: Secondary | ICD-10-CM

## 2021-05-14 ENCOUNTER — Ambulatory Visit (INDEPENDENT_AMBULATORY_CARE_PROVIDER_SITE_OTHER): Payer: Medicaid Other | Admitting: Acute Care

## 2021-05-14 DIAGNOSIS — F17210 Nicotine dependence, cigarettes, uncomplicated: Secondary | ICD-10-CM | POA: Diagnosis not present

## 2021-05-14 NOTE — Progress Notes (Signed)
Virtual Visit via Telephone Note  I connected with Perry Bullock on 05/14/21 at 12:00 PM EST by telephone and verified that I am speaking with the correct person using two identifiers.  Location: Patient: Perry Bullock Provider: Working from home    I discussed the limitations, risks, security and privacy concerns of performing an evaluation and management service by telephone and the availability of in person appointments. I also discussed with the patient that there may be a patient responsible charge related to this service. The patient expressed understanding and agreed to proceed.  Shared Decision Making Visit Lung Cancer Screening Program [id number])   Eligibility: Age 60 y.o. Pack Years Smoking History Calculation 30 (# packs/per year x # years smoked) Recent History of coughing up blood  no Unexplained weight loss? no ( >Than 15 pounds within the last 6 months ) Prior History Lung / other cancer no (Diagnosis within the last 5 years already requiring surveillance chest CT Scans). Smoking Status Current Smoker Former Smokers: Years since quit: NA  Quit Date: NA  Visit Components: Discussion included one or more decision making aids. yes Discussion included risk/benefits of screening. yes Discussion included potential follow up diagnostic testing for abnormal scans. yes Discussion included meaning and risk of over diagnosis. yes Discussion included meaning and risk of False Positives. yes Discussion included meaning of total radiation exposure. yes  Counseling Included: Importance of adherence to annual lung cancer LDCT screening. yes Impact of comorbidities on ability to participate in the program. yes Ability and willingness to under diagnostic treatment. yes  Smoking Cessation Counseling: Current Smokers:  Discussed importance of smoking cessation. yes Information about tobacco cessation classes and interventions provided to patient. yes Patient provided with "ticket" for  LDCT Scan. yes Symptomatic Patient. no  Counseling(Intermediate counseling: > three minutes) 99406 Diagnosis Code: Tobacco Use Z72.0 Asymptomatic Patient no  Counseling NA Former Smokers:  Discussed the importance of maintaining cigarette abstinence. yes Diagnosis Code: Personal History of Nicotine Dependence. W41.324 Information about tobacco cessation classes and interventions provided to patient. Yes Patient provided with "ticket" for LDCT Scan. yes Written Order for Lung Cancer Screening with LDCT placed in Epic. Yes (CT Chest Lung Cancer Screening Low Dose W/O CM) [id number] Z12.2-Screening of respiratory organs Z87.891-Personal history of nicotine dependence   I spent 25 minutes of face to face time with him discussing the risks and benefits of lung cancer screening. We viewed a power point together that explained in detail the above noted topics. We took the time to pause the power point at intervals to allow for questions to be asked and answered to ensure understanding. We discussed that he had taken the single most powerful action possible to decrease his risk of developing lung cancer when he quit smoking. I counseled him to remain smoke free, and to contact me if he ever had the desire to smoke again so that I can provide resources and tools to help support the effort to remain smoke free. We discussed the time and location of the scan, and that either  Abigail Miyamoto RN or I will call with the results within  24-48 hours of receiving them. He has my card and contact information in the event he needs to speak with me, in addition to a copy of the power point we reviewed as a resource. He verbalized understanding of all of the above and had no further questions upon leaving the office.     I explained to the patient that there has been a  high incidence of coronary artery disease noted on these exams. I explained that this is a non-gated exam therefore degree or severity cannot be  determined. This patient is not on statin therapy. I have asked the patient to follow-up with their PCP regarding any incidental finding of coronary artery disease and management with diet or medication as they feel is clinically indicated. The patient verbalized understanding of the above and had no further questions.   Jareli Highland D. Tiburcio Pea, NP-C [facility] Personal contact information can be found on Amion  05/14/2021, 11:56 AM

## 2021-05-15 ENCOUNTER — Ambulatory Visit
Admission: RE | Admit: 2021-05-15 | Discharge: 2021-05-15 | Disposition: A | Discharge status: Home / Self Care | Payer: Medicaid Other | Source: Ambulatory Visit | Attending: Acute Care | Admitting: Acute Care

## 2021-05-15 ENCOUNTER — Other Ambulatory Visit: Payer: Self-pay

## 2021-05-15 DIAGNOSIS — F17210 Nicotine dependence, cigarettes, uncomplicated: Secondary | ICD-10-CM | POA: Diagnosis present

## 2021-05-15 DIAGNOSIS — Z87891 Personal history of nicotine dependence: Secondary | ICD-10-CM | POA: Insufficient documentation

## 2021-05-15 NOTE — Addendum Note (Signed)
Addended by: Janeann Forehand D on: 05/15/2021 04:51 PM   Modules accepted: Level of Service

## 2021-06-06 ENCOUNTER — Telehealth: Payer: Self-pay | Admitting: Acute Care

## 2021-06-06 DIAGNOSIS — F17210 Nicotine dependence, cigarettes, uncomplicated: Secondary | ICD-10-CM

## 2021-06-06 DIAGNOSIS — Z87891 Personal history of nicotine dependence: Secondary | ICD-10-CM

## 2021-06-06 NOTE — Addendum Note (Signed)
Addended by: Abigail Miyamoto D on: 06/06/2021 11:56 AM   Modules accepted: Orders

## 2021-06-06 NOTE — Telephone Encounter (Signed)
Per Perry Bullock she was able to speak with pt regarding CT results. Results faxed to PCP. Order placed for 3 mth f/u nodule CT.

## 2021-06-06 NOTE — Telephone Encounter (Signed)
I have attempted to call the patient several times. The single number on filr for him rings twice and then goes to a fast busy signal. There is no option to leave a message.  I have called his significant other . There was no answer, but I did leave her a message with our contact information requesting that she share any contact information she may have that would help [place] get in touch with the patient. This was a HIPPA compliant message. No results were discussed . It was simply a request to help [place] contact the patient.   Angelique Blonder, we may need to send a certified letter if we do not hear back by Monday.  This was a 4 A and radiology suspects this is post infectious.  Please place order for 3 month follow up, and fax results to PCP.   Thanks so much

## 2021-08-28 ENCOUNTER — Telehealth: Payer: Self-pay | Admitting: Acute Care

## 2021-08-28 NOTE — Telephone Encounter (Signed)
Left message for pt to call to schedule f/u lung screening CT scan.  °

## 2021-09-24 NOTE — Telephone Encounter (Signed)
Left another message for pt to call back to schedule f/u lung screening CT scan.  ?

## 2021-09-25 ENCOUNTER — Other Ambulatory Visit: Payer: Self-pay

## 2021-09-25 DIAGNOSIS — R911 Solitary pulmonary nodule: Secondary | ICD-10-CM

## 2021-09-25 DIAGNOSIS — Z87891 Personal history of nicotine dependence: Secondary | ICD-10-CM

## 2021-09-25 DIAGNOSIS — F17210 Nicotine dependence, cigarettes, uncomplicated: Secondary | ICD-10-CM

## 2021-09-30 ENCOUNTER — Other Ambulatory Visit: Payer: Self-pay

## 2021-09-30 ENCOUNTER — Observation Stay: Payer: Medicaid Other

## 2021-09-30 ENCOUNTER — Emergency Department: Payer: Medicaid Other

## 2021-09-30 ENCOUNTER — Inpatient Hospital Stay
Admission: EM | Admit: 2021-09-30 | Discharge: 2021-10-04 | DRG: 536 | Disposition: A | Discharge status: Skilled Nursing Facility | Payer: Medicaid Other | Attending: Internal Medicine | Admitting: Internal Medicine

## 2021-09-30 ENCOUNTER — Ambulatory Visit: Payer: Medicaid Other | Admitting: Nurse Practitioner

## 2021-09-30 ENCOUNTER — Ambulatory Visit: Payer: Medicaid Other

## 2021-09-30 DIAGNOSIS — K219 Gastro-esophageal reflux disease without esophagitis: Secondary | ICD-10-CM | POA: Diagnosis present

## 2021-09-30 DIAGNOSIS — Y92009 Unspecified place in unspecified non-institutional (private) residence as the place of occurrence of the external cause: Principal | ICD-10-CM

## 2021-09-30 DIAGNOSIS — D649 Anemia, unspecified: Secondary | ICD-10-CM | POA: Diagnosis present

## 2021-09-30 DIAGNOSIS — F1010 Alcohol abuse, uncomplicated: Secondary | ICD-10-CM | POA: Diagnosis present

## 2021-09-30 DIAGNOSIS — S32409A Unspecified fracture of unspecified acetabulum, initial encounter for closed fracture: Secondary | ICD-10-CM | POA: Diagnosis present

## 2021-09-30 DIAGNOSIS — Z716 Tobacco abuse counseling: Secondary | ICD-10-CM

## 2021-09-30 DIAGNOSIS — Z79899 Other long term (current) drug therapy: Secondary | ICD-10-CM

## 2021-09-30 DIAGNOSIS — I4510 Unspecified right bundle-branch block: Secondary | ICD-10-CM | POA: Diagnosis present

## 2021-09-30 DIAGNOSIS — M7989 Other specified soft tissue disorders: Secondary | ICD-10-CM | POA: Diagnosis present

## 2021-09-30 DIAGNOSIS — W19XXXA Unspecified fall, initial encounter: Principal | ICD-10-CM

## 2021-09-30 DIAGNOSIS — F419 Anxiety disorder, unspecified: Secondary | ICD-10-CM | POA: Diagnosis present

## 2021-09-30 DIAGNOSIS — B9620 Unspecified Escherichia coli [E. coli] as the cause of diseases classified elsewhere: Secondary | ICD-10-CM | POA: Diagnosis present

## 2021-09-30 DIAGNOSIS — D696 Thrombocytopenia, unspecified: Secondary | ICD-10-CM | POA: Diagnosis present

## 2021-09-30 DIAGNOSIS — M5136 Other intervertebral disc degeneration, lumbar region: Secondary | ICD-10-CM | POA: Diagnosis present

## 2021-09-30 DIAGNOSIS — R55 Syncope and collapse: Secondary | ICD-10-CM | POA: Diagnosis present

## 2021-09-30 DIAGNOSIS — Z8249 Family history of ischemic heart disease and other diseases of the circulatory system: Secondary | ICD-10-CM

## 2021-09-30 DIAGNOSIS — Z720 Tobacco use: Secondary | ICD-10-CM | POA: Diagnosis present

## 2021-09-30 DIAGNOSIS — D62 Acute posthemorrhagic anemia: Secondary | ICD-10-CM | POA: Diagnosis present

## 2021-09-30 DIAGNOSIS — Z888 Allergy status to other drugs, medicaments and biological substances status: Secondary | ICD-10-CM

## 2021-09-30 DIAGNOSIS — F319 Bipolar disorder, unspecified: Secondary | ICD-10-CM | POA: Diagnosis present

## 2021-09-30 DIAGNOSIS — I10 Essential (primary) hypertension: Secondary | ICD-10-CM | POA: Diagnosis present

## 2021-09-30 DIAGNOSIS — F17210 Nicotine dependence, cigarettes, uncomplicated: Secondary | ICD-10-CM | POA: Diagnosis present

## 2021-09-30 DIAGNOSIS — S32445A Nondisplaced fracture of posterior column [ilioischial] of left acetabulum, initial encounter for closed fracture: Principal | ICD-10-CM | POA: Diagnosis present

## 2021-09-30 DIAGNOSIS — F1410 Cocaine abuse, uncomplicated: Secondary | ICD-10-CM | POA: Diagnosis present

## 2021-09-30 DIAGNOSIS — S41111A Laceration without foreign body of right upper arm, initial encounter: Secondary | ICD-10-CM | POA: Diagnosis present

## 2021-09-30 DIAGNOSIS — K760 Fatty (change of) liver, not elsewhere classified: Secondary | ICD-10-CM | POA: Diagnosis present

## 2021-09-30 DIAGNOSIS — E871 Hypo-osmolality and hyponatremia: Secondary | ICD-10-CM | POA: Diagnosis present

## 2021-09-30 DIAGNOSIS — M1990 Unspecified osteoarthritis, unspecified site: Secondary | ICD-10-CM | POA: Diagnosis present

## 2021-09-30 DIAGNOSIS — Z7951 Long term (current) use of inhaled steroids: Secondary | ICD-10-CM

## 2021-09-30 DIAGNOSIS — R160 Hepatomegaly, not elsewhere classified: Secondary | ICD-10-CM | POA: Diagnosis present

## 2021-09-30 DIAGNOSIS — J449 Chronic obstructive pulmonary disease, unspecified: Secondary | ICD-10-CM | POA: Diagnosis present

## 2021-09-30 DIAGNOSIS — M549 Dorsalgia, unspecified: Secondary | ICD-10-CM | POA: Diagnosis present

## 2021-09-30 DIAGNOSIS — G8929 Other chronic pain: Secondary | ICD-10-CM | POA: Diagnosis present

## 2021-09-30 DIAGNOSIS — D638 Anemia in other chronic diseases classified elsewhere: Secondary | ICD-10-CM | POA: Diagnosis present

## 2021-09-30 DIAGNOSIS — S5002XA Contusion of left elbow, initial encounter: Secondary | ICD-10-CM | POA: Diagnosis present

## 2021-09-30 DIAGNOSIS — H5461 Unqualified visual loss, right eye, normal vision left eye: Secondary | ICD-10-CM | POA: Diagnosis present

## 2021-09-30 DIAGNOSIS — N390 Urinary tract infection, site not specified: Secondary | ICD-10-CM | POA: Diagnosis present

## 2021-09-30 DIAGNOSIS — E1151 Type 2 diabetes mellitus with diabetic peripheral angiopathy without gangrene: Secondary | ICD-10-CM | POA: Diagnosis present

## 2021-09-30 DIAGNOSIS — Z1611 Resistance to penicillins: Secondary | ICD-10-CM | POA: Diagnosis present

## 2021-09-30 DIAGNOSIS — J302 Other seasonal allergic rhinitis: Secondary | ICD-10-CM | POA: Diagnosis present

## 2021-09-30 DIAGNOSIS — Z96612 Presence of left artificial shoulder joint: Secondary | ICD-10-CM | POA: Diagnosis present

## 2021-09-30 DIAGNOSIS — H409 Unspecified glaucoma: Secondary | ICD-10-CM | POA: Diagnosis present

## 2021-09-30 DIAGNOSIS — W1830XA Fall on same level, unspecified, initial encounter: Secondary | ICD-10-CM | POA: Diagnosis present

## 2021-09-30 DIAGNOSIS — Z833 Family history of diabetes mellitus: Secondary | ICD-10-CM

## 2021-09-30 LAB — MAGNESIUM: Magnesium: 1.7 mg/dL (ref 1.7–2.4)

## 2021-09-30 LAB — URINALYSIS, ROUTINE W REFLEX MICROSCOPIC
Bilirubin Urine: NEGATIVE
Glucose, UA: NEGATIVE mg/dL
Hgb urine dipstick: NEGATIVE
Ketones, ur: NEGATIVE mg/dL
Nitrite: NEGATIVE
Protein, ur: NEGATIVE mg/dL
Specific Gravity, Urine: 1.013 (ref 1.005–1.030)
WBC, UA: >50 WBC/hpf — ABNORMAL HIGH (ref 0–5)
pH: 5 (ref 5.0–8.0)

## 2021-09-30 LAB — CBC WITH DIFFERENTIAL/PLATELET
Abs Immature Granulocytes: 0.03 10*3/uL (ref 0.00–0.07)
Basophils Absolute: 0 10*3/uL (ref 0.0–0.1)
Basophils Relative: 0 %
Eosinophils Absolute: 0.1 10*3/uL (ref 0.0–0.5)
Eosinophils Relative: 1 %
HCT: 29.2 % — ABNORMAL LOW (ref 39.0–52.0)
Hemoglobin: 9.6 g/dL — ABNORMAL LOW (ref 13.0–17.0)
Immature Granulocytes: 0 %
Lymphocytes Relative: 24 %
Lymphs Abs: 1.8 10*3/uL (ref 0.7–4.0)
MCH: 34 pg (ref 26.0–34.0)
MCHC: 32.9 g/dL (ref 30.0–36.0)
MCV: 103.5 fL — ABNORMAL HIGH (ref 80.0–100.0)
Monocytes Absolute: 1.1 10*3/uL — ABNORMAL HIGH (ref 0.1–1.0)
Monocytes Relative: 15 %
Neutro Abs: 4.3 10*3/uL (ref 1.7–7.7)
Neutrophils Relative %: 60 %
Platelets: 74 10*3/uL — ABNORMAL LOW (ref 150–400)
RBC: 2.82 MIL/uL — ABNORMAL LOW (ref 4.22–5.81)
RDW: 13.8 % (ref 11.5–15.5)
WBC: 7.3 10*3/uL (ref 4.0–10.5)
nRBC: 0 % (ref 0.0–0.2)

## 2021-09-30 LAB — URINE DRUG SCREEN, QUALITATIVE (ARMC ONLY)
Amphetamines, Ur Screen: NOT DETECTED
Barbiturates, Ur Screen: NOT DETECTED
Benzodiazepine, Ur Scrn: NOT DETECTED
Cannabinoid 50 Ng, Ur ~~LOC~~: NOT DETECTED
Cocaine Metabolite,Ur ~~LOC~~: NOT DETECTED
MDMA (Ecstasy)Ur Screen: NOT DETECTED
Methadone Scn, Ur: NOT DETECTED
Opiate, Ur Screen: POSITIVE — AB
Phencyclidine (PCP) Ur S: NOT DETECTED
Tricyclic, Ur Screen: POSITIVE — AB

## 2021-09-30 LAB — BASIC METABOLIC PANEL
Anion gap: 10 (ref 5–15)
BUN: 16 mg/dL (ref 6–20)
CO2: 22 mmol/L (ref 22–32)
Calcium: 8.1 mg/dL — ABNORMAL LOW (ref 8.9–10.3)
Chloride: 97 mmol/L — ABNORMAL LOW (ref 98–111)
Creatinine, Ser: 0.78 mg/dL (ref 0.61–1.24)
GFR, Estimated: >60 mL/min (ref >60)
Glucose, Bld: 102 mg/dL — ABNORMAL HIGH (ref 70–99)
Potassium: 3.8 mmol/L (ref 3.5–5.1)
Sodium: 129 mmol/L — ABNORMAL LOW (ref 135–145)

## 2021-09-30 LAB — T4, FREE: Free T4: 1.43 ng/dL — ABNORMAL HIGH (ref 0.61–1.12)

## 2021-09-30 LAB — ETHANOL: Alcohol, Ethyl (B): <10 mg/dL (ref <10)

## 2021-09-30 LAB — TSH: TSH: 2.355 u[IU]/mL (ref 0.350–4.500)

## 2021-09-30 MED ORDER — SODIUM CHLORIDE 0.9 % IV BOLUS
1000.0000 mL | Freq: Once | INTRAVENOUS | Status: AC
  Administered 2021-09-30: 1000 mL via INTRAVENOUS

## 2021-09-30 MED ORDER — SODIUM CHLORIDE 0.9 % IV SOLN
1.0000 g | Freq: Once | INTRAVENOUS | Status: AC
  Administered 2021-09-30: 1 g via INTRAVENOUS
  Filled 2021-09-30: qty 10

## 2021-09-30 MED ORDER — ACETAMINOPHEN 325 MG PO TABS
650.0000 mg | ORAL_TABLET | Freq: Four times a day (QID) | ORAL | Status: DC | PRN
  Administered 2021-10-01: 650 mg via ORAL
  Filled 2021-09-30 (×2): qty 2

## 2021-09-30 MED ORDER — MAGNESIUM SULFATE IN D5W 1-5 GM/100ML-% IV SOLN
1.0000 g | Freq: Once | INTRAVENOUS | Status: AC
  Administered 2021-09-30: 1 g via INTRAVENOUS
  Filled 2021-09-30: qty 100

## 2021-09-30 MED ORDER — SODIUM CHLORIDE 0.9 % IV SOLN: INTRAVENOUS | Status: AC

## 2021-09-30 MED ORDER — MORPHINE SULFATE (PF) 2 MG/ML IV SOLN
2.0000 mg | Freq: Once | INTRAVENOUS | Status: AC
  Administered 2021-09-30: 2 mg via INTRAVENOUS
  Filled 2021-09-30: qty 1

## 2021-09-30 MED ORDER — HEPARIN SODIUM (PORCINE) 5000 UNIT/ML IJ SOLN
5000.0000 [IU] | Freq: Three times a day (TID) | INTRAMUSCULAR | Status: DC
  Administered 2021-09-30 – 2021-10-01 (×2): 5000 [IU] via SUBCUTANEOUS
  Filled 2021-09-30 (×2): qty 1

## 2021-09-30 MED ORDER — MORPHINE SULFATE (PF) 4 MG/ML IV SOLN
4.0000 mg | Freq: Once | INTRAVENOUS | Status: AC
  Administered 2021-09-30: 4 mg via INTRAVENOUS
  Filled 2021-09-30: qty 1

## 2021-09-30 MED ORDER — ACETAMINOPHEN 650 MG RE SUPP: 650.0000 mg | Freq: Four times a day (QID) | RECTAL | Status: DC | PRN

## 2021-09-30 MED ORDER — THIAMINE HCL 100 MG/ML IJ SOLN
100.0000 mg | Freq: Every day | INTRAMUSCULAR | Status: DC
  Administered 2021-09-30: 100 mg via INTRAVENOUS
  Filled 2021-09-30: qty 2

## 2021-09-30 MED ORDER — SODIUM CHLORIDE 0.9% FLUSH
3.0000 mL | Freq: Two times a day (BID) | INTRAVENOUS | Status: DC
  Administered 2021-09-30 – 2021-10-04 (×8): 3 mL via INTRAVENOUS

## 2021-09-30 MED ORDER — OXYCODONE-ACETAMINOPHEN 5-325 MG PO TABS
1.0000 | ORAL_TABLET | Freq: Once | ORAL | Status: AC
  Administered 2021-09-30: 1 via ORAL
  Filled 2021-09-30: qty 1

## 2021-09-30 NOTE — ED Notes (Signed)
Red, lav and lt green tubes sent to lab.  ?

## 2021-09-30 NOTE — ED Notes (Signed)
72 yom with a c/c of accidental fall at home. The pt advised he is having left sided hip pain and  has skin tears to his upper arm.  ?

## 2021-09-30 NOTE — ED Triage Notes (Signed)
FIRST NURSE NOTE:  ?Pt via EMS from home. Pt had a fall on Friday. Pt is limited mobility, does not normally get up. Pt states he is having increased weakness. Pt c/o L hip, L arm pain. No deformity noted. VSS. Pt is A&Ox4 and NAD.  ?

## 2021-09-30 NOTE — ED Notes (Signed)
Pt away at imaging.

## 2021-09-30 NOTE — ED Notes (Signed)
Pt provided sandwich tray 

## 2021-09-30 NOTE — ED Notes (Signed)
Pt reports he wears 3L O2 at home as needed. Pt requested O2 now even though 94% RA and resp reg/unlabored currently. Pt placed on 3L O2 and is now at 98%. ?

## 2021-09-30 NOTE — ED Notes (Signed)
IV team at bedside 

## 2021-09-30 NOTE — ED Triage Notes (Addendum)
Pt comes via EMS from home with c/o trip and fall. Pt states he has left hip apin. 9/10 at this time. Pt states he did hit his head, no loc or thinners.  Pt states neck pain. ?

## 2021-09-30 NOTE — ED Notes (Signed)
Pt given TV remote 

## 2021-09-30 NOTE — ED Notes (Addendum)
IV team staff remain in another pt's room at this time; pt reports he is a hard stick and is refusing for this RN to attempt for an IV currently. Lots of bruising noted to pt's arms.  ?

## 2021-09-30 NOTE — ED Notes (Signed)
Pt mild distress in bed, complaining, a/ox4. Pt states he fell Friday onto left side and states he has a broken hip. +LOC and head pain s/p fall. - obvious deformity noted, +pmsc.  ?

## 2021-09-30 NOTE — ED Notes (Signed)
Will have Amy RN assess IV as cannot flush or draw blood from it currently.  ?

## 2021-09-30 NOTE — ED Notes (Signed)
EKG to EDP [place] in person.  

## 2021-09-30 NOTE — ED Notes (Signed)
See triage note  presents with friend via EMS   states he had a fall on Friday  per friend he doesn't usually have good mobility   pos.weakness   he is having pain to left arm and hip area ?

## 2021-09-30 NOTE — ED Notes (Signed)
IV team staff in ER in other room currently; verbalized to them in person need for this pt's IV to be assessed STAT. IV staff stated they will assess as soon as possible and this RN was told to place order for consult; order placed.  ?

## 2021-09-30 NOTE — H&P (Signed)
?History and Physical  ? ? ?Patient: Perry Bullock [place] [id number] DOB: Feb 13, 1961 ?DOA: 09/30/2021 ?DOS: the patient was seen and examined on 10/01/2021 ?PCP: [facility]  ?Patient coming from: Home ? ?Chief Complaint:  ?Chief Complaint  ?Patient presents with  ? Fall  ? ?HPI: Perry Bullock is a 61 y.o. male with medical history significant of hypertension drug abuse tobacco abuse presenting with a syncopal episode when he was walking. ?Patient reports that he passed out for a minute or 2 when he woke up and came to the hospital.  Patient currently lives with his partner who is also his caretaker for the past 30 years. ? ?Review of Systems: Review of Systems  ?Musculoskeletal:  Positive for joint pain.  ?Neurological:  Positive for loss of consciousness.  ?All other systems reviewed and are negative. ? ?Past Medical History:  ?Diagnosis Date  ? Anxiety   ? Arthritis   ? Asthma   ? Bipolar 1 disorder ([place])   ? Blind right eye   ? Broken hip ([place])   ? Chronic back pain   ? Diverticulosis  ? Chronic headaches   ? Convulsion ([place])   ? once a month when stands up too quickly  ? COPD (chronic obstructive pulmonary disease) ([place])   ? DDD (degenerative disc disease), lumbar   ? Diverticulitis   ? GERD (gastroesophageal reflux disease)   ? Glaucoma   ? Hepatitis   ? hepatitis C  ? Pneumonia   ? PVD (peripheral vascular disease) ([place])   ? lower extremities reddened and feet swollen  ? Seasonal allergies   ? Shortness of breath dyspnea   ? ?Past Surgical History:  ?Procedure Laterality Date  ? EYE SURGERY Right   ? HIP SURGERY Left   ? INTRAMEDULLARY (IM) NAIL INTERTROCHANTERIC Right 01/16/2017  ? Procedure: INTRAMEDULLARY (IM) NAIL INTERTROCHANTRIC;  Surgeon: Corky Mull, MD;  Location: [facility] ORS;  Service: Orthopedics;  Laterality: Right;  ? INTRAMEDULLARY (IM) NAIL INTERTROCHANTERIC Left 03/09/2020  ? Procedure: INTRAMEDULLARY (IM) NAIL INTERTROCHANTRIC;  Surgeon: Thornton Park, MD;  Location: [facility] ORS;   Service: Orthopedics;  Laterality: Left;  ? INTUBATION-ENDOTRACHEAL WITH TRACHEOSTOMY STANDBY N/A 04/22/2015  ? Procedure: INTUBATION-ENDOTRACHEAL WITH TRACHEOSTOMY STANDBY;  Surgeon: Beverly Gust, MD;  Location: [facility] ORS;  Service: ENT;  Laterality: N/A;  ? JOINT REPLACEMENT Left   ? Lt shoulder  ? MANDIBLE SURGERY    ? ?Social History:  reports that he has been smoking cigarettes. He has never used smokeless tobacco. He reports current alcohol use of about 3.0 standard drinks per week. He reports that he does not use drugs. ? ?Allergies  ?Allergen Reactions  ? Ace Inhibitors Swelling  ?  Angioedema 10/16 requiring intubation @ OSH 2/2 pt reportedly taking someone else's Lisinopril.  ?Angioedema 10/16 requiring intubation @ OSH 2/2 pt reportedly taking someone else's Lisinopril.  ?  ? ? ?Family History  ?Problem Relation Age of Onset  ? Breast cancer Mother   ? Hypertension Father   ? Diabetes Father   ? ? ?Prior to Admission medications   ?Medication Sig Start Date End Date Taking? Authorizing Provider  ?albuterol (VENTOLIN HFA) 108 (90 Base) MCG/ACT inhaler Inhale 2 puffs into the lungs every 6 (six) hours as needed for wheezing or shortness of breath. 01/21/21   Allyne Gee, MD  ?allopurinol (ZYLOPRIM) 100 MG tablet Take 1 tablet (100 mg total) by mouth daily. 11/12/17   Fritzi Mandes, MD  ?azithromycin (ZITHROMAX) 250 MG tablet Take one  tab po qd for 14 days. 08/08/20   Luiz Ochoa, NP  ?cetirizine (ZYRTEC) 10 MG tablet Take 10 mg by mouth daily.    [provider]  ?docusate sodium (COLACE) 100 MG capsule Take 1 capsule (100 mg total) by mouth 2 (two) times daily. 03/15/20   Nolberto Hanlon, MD  ?fluticasone (FLONASE) 50 MCG/ACT nasal spray Place 2 sprays into both nostrils as needed.     [provider]  ?folic acid (FOLVITE) 1 MG tablet Take 1 tablet (1 mg total) by mouth daily. 03/15/20   Nolberto Hanlon, MD  ?gabapentin (NEURONTIN) 600 MG tablet Take 600 mg by mouth 3 (three) times daily.      [provider]  ?iron polysaccharides (NIFEREX) 150 MG capsule Take 1 capsule (150 mg total) by mouth daily. 03/15/20   Nolberto Hanlon, MD  ?lidocaine (LIDODERM) 5 % Place 1 patch onto the skin daily. Remove & Discard patch within 12 hours or as directed by MD 03/15/20   Nolberto Hanlon, MD  ?methocarbamol (ROBAXIN) 500 MG tablet Take 1 tablet (500 mg total) by mouth 2 (two) times daily as needed for muscle spasms. 03/15/20   Nolberto Hanlon, MD  ?mometasone-formoterol (DULERA) 100-5 MCG/ACT AERO Inhale 2 puffs into the lungs 2 (two) times daily. 01/21/21   Allyne Gee, MD  ?Multiple Vitamin (MULTIVITAMIN WITH MINERALS) TABS tablet Take 1 tablet by mouth daily. 03/15/20   Nolberto Hanlon, MD  ?nicotine (NICODERM CQ - DOSED IN MG/24 HOURS) 21 mg/24hr patch Place 1 patch (21 mg total) onto the skin daily. 05/08/20   Luiz Ochoa, NP  ?oxyCODONE-acetaminophen (PERCOCET/ROXICET) 5-325 MG tablet Take 1 tablet by mouth every 4 (four) hours as needed for moderate pain. 03/15/20   Nolberto Hanlon, MD  ?pantoprazole (PROTONIX) 40 MG tablet Take 1 tablet (40 mg total) by mouth daily. 11/11/17   Fritzi Mandes, MD  ?thiamine 100 MG tablet Take 1 tablet (100 mg total) by mouth daily. 03/15/20   Nolberto Hanlon, MD  ?tiotropium (SPIRIVA HANDIHALER) 18 MCG inhalation capsule Place 1 capsule (18 mcg total) into inhaler and inhale daily. 01/21/21   Allyne Gee, MD  ? ? ?Physical Exam: ?Vitals:  ? 09/30/21 2133 09/30/21 2148 09/30/21 2149 09/30/21 2344  ?BP:  95/69 103/69 112/75  ?Pulse: 95   84  ?Resp:    20  ?Temp:      ?SpO2: 98%   99%  ?Weight:      ?Height:      ?Physical Exam ?Vitals and nursing note reviewed.  ?Constitutional:   ?   General: He is not in acute distress. ?   Appearance: Normal appearance. He is not ill-appearing, toxic-appearing or diaphoretic.  ?HENT:  ?   Head: Normocephalic and atraumatic.  ?   Right Ear: Hearing and external ear normal.  ?   Left Ear: Hearing and external ear normal.  ?   Nose: Nose normal. No  nasal deformity.  ?   Mouth/Throat:  ?   Lips: Pink.  ?   Mouth: Mucous membranes are moist.  ?   Tongue: No lesions.  ?   Pharynx: Oropharynx is clear.  ?Eyes:  ?   General: Lids are normal.  ?   Extraocular Movements: Extraocular movements intact.  ?   Right eye: Normal extraocular motion.  ?   Left eye: Normal extraocular motion.  ?   Pupils:  ?   Right eye: Pupil is not reactive.  ?Neck:  ?   Vascular: No  carotid bruit.  ?Cardiovascular:  ?   Rate and Rhythm: Normal rate and regular rhythm.  ?   Pulses: Normal pulses.  ?   Heart sounds: Normal heart sounds.  ?Pulmonary:  ?   Effort: Pulmonary effort is normal.  ?   Breath sounds: Normal breath sounds.  ?Abdominal:  ?   General: Bowel sounds are normal. There is no distension.  ?   Palpations: Abdomen is soft. There is no mass.  ?   Tenderness: There is no abdominal tenderness. There is no guarding.  ?   Hernia: No hernia is present.  ?Musculoskeletal:  ?   Right lower leg: No edema.  ?   Left lower leg: No edema.  ?Skin: ?   General: Skin is warm.  ?Neurological:  ?   General: No focal deficit present.  ?   Mental Status: He is alert and oriented to person, place, and time.  ?   Cranial Nerves: Cranial nerves 2-12 are intact.  ?   Motor: Motor function is intact.  ?Psychiatric:     ?   Attention and Perception: Attention normal.     ?   Mood and Affect: Mood normal.     ?   Speech: Speech normal.     ?   Behavior: Behavior normal. Behavior is cooperative.     ?   Cognition and Memory: Cognition normal.  ? ? ?Data Reviewed: ?Results for orders placed or performed during the hospital encounter of 09/30/21 (from the past 24 hour(s))  ?Urinalysis, Routine w reflex microscopic Urine, Random     Status: Abnormal  ? Collection Time: 09/30/21  4:30 PM  ?Result Value Ref Range  ? Color, Urine AMBER (A) YELLOW  ? APPearance HAZY (A) CLEAR  ? Specific Gravity, Urine 1.013 1.005 - 1.030  ? pH 5.0 5.0 - 8.0  ? Glucose, UA NEGATIVE NEGATIVE mg/dL  ? Hgb urine dipstick  NEGATIVE NEGATIVE  ? Bilirubin Urine NEGATIVE NEGATIVE  ? Ketones, ur NEGATIVE NEGATIVE mg/dL  ? Protein, ur NEGATIVE NEGATIVE mg/dL  ? Nitrite NEGATIVE NEGATIVE  ? Leukocytes,Ua LARGE (A) NEGATIVE  ? RBC / HP

## 2021-09-30 NOTE — ED Provider Notes (Signed)
? ? ? ?[facility] ?Emergency Department Provider Note ? ? ? ? Event Date/Time  ? First MD Initiated Contact with Patient 09/30/21 1434   ?  (approximate) ? ? ?History  ? ?Fall ? ? ?HPI ? ?Perry Bullock is a 61 y.o. male with history of COPD, bipolar disorder, DDD, hepatitis, and prior right hip fracture status post repair, presents to the ED following a mechanical fall. Patient presents from home via EMS where he complains of a fall on Friday.  He presents with left hip/groin pain as well as left arm pain and notes head injury without LOC.  Also reports some neck pain.  Patient notes some superficial skin tears to the right upper arm denies any disability or deformity.  Patient apparently lives with his roommate who is a retired Engineer, civil (consulting)nurse.  She introduces herself as Marylene Landngela, his primary caregiver in the home.  She notes that the patient previously been offered a SNF placement due to disability with range of motion, ADLs, and failure to thrive.  Patient who still makes his own medical decisions, apparently declined the placement when it was available to him. ? ? ?Physical Exam  ? ?Triage Vital Signs: ?ED Triage Vitals  ?Enc Vitals Group  ?   BP 09/30/21 1159 115/83  ?   Pulse Rate 09/30/21 1159 (!) 114  ?   Resp 09/30/21 1159 18  ?   Temp 09/30/21 1159 98 ?F (36.7 ?C)  ?   Temp src --   ?   SpO2 09/30/21 1159 94 %  ?   Weight 09/30/21 1352 160 lb 0.9 oz (72.6 kg)  ?   Height 09/30/21 1352 5\' 10"  (1.778 m)  ?   Head Circumference --   ?   Peak Flow --   ?   Pain Score 09/30/21 1157 9  ?   Pain Loc --   ?   Pain Edu? --   ?   Excl. in GC? --   ? ? ?Most recent vital signs: ?Vitals:  ? 09/30/21 1159 09/30/21 1823  ?BP: 115/83 118/80  ?Pulse: (!) 114 (!) 110  ?Resp: 18 18  ?Temp: 98 ?F (36.7 ?C)   ?SpO2: 94% 94%  ? ? ?General Awake, no distress.  ?CV:  Good peripheral perfusion. RRR ?RESP:  Normal effort. CTA ?ABD:  No distention. Soft, nontend ?MSK:  Left leg and hip without obvious deformity,  dislocation, leg length discrepancy, or external rotation.  Patient, tender to palpation to the left inguinal region and the left lateral hip region.  Decreased hip flexion range secondary to pain. ? ? ?ED Results / Procedures / Treatments  ? ?Labs ?(all labs ordered are listed, but only abnormal results are displayed) ?Labs Reviewed  ?URINALYSIS, ROUTINE W REFLEX MICROSCOPIC - Abnormal; Notable for the following components:  ?    Result Value  ? Color, Urine AMBER (*)   ? APPearance HAZY (*)   ? Leukocytes,Ua LARGE (*)   ? WBC, UA >50 (*)   ? Bacteria, UA MANY (*)   ? All other components within normal limits  ?BASIC METABOLIC PANEL - Abnormal; Notable for the following components:  ? Sodium 129 (*)   ? Chloride 97 (*)   ? Glucose, Bld 102 (*)   ? Calcium 8.1 (*)   ? All other components within normal limits  ?CBC WITH DIFFERENTIAL/PLATELET - Abnormal; Notable for the following components:  ? RBC 2.82 (*)   ? Hemoglobin 9.6 (*)   ?  HCT 29.2 (*)   ? MCV 103.5 (*)   ? Platelets 74 (*)   ? Monocytes Absolute 1.1 (*)   ? All other components within normal limits  ?URINE CULTURE  ?ETHANOL  ? ? ? ?EKG ? ?See EKG report ? ?RADIOLOGY ? ?I personally viewed and evaluated these images as part of my medical decision making, as well as reviewing the written report by the radiologist. ? ?ED Provider Interpretation: acetabular fracture as noted} ? ?CT HEAD WO CONTRAST ( ) ? ?Result Date: 09/30/2021 ?CLINICAL DATA:  Fall.  Patient had a fall on Friday. EXAM: CT HEAD WITHOUT CONTRAST CT CERVICAL SPINE WITHOUT CONTRAST TECHNIQUE: Multidetector CT imaging of the head and cervical spine was performed following the standard protocol without intravenous contrast. Multiplanar CT image reconstructions of the cervical spine were also generated. RADIATION DOSE REDUCTION: This exam was performed according to the departmental dose-optimization program which includes automated exposure control, adjustment of the mA and/or kV according to  patient size and/or use of iterative reconstruction technique. COMPARISON:  CT head and C-spine dated March 08, 2020 FINDINGS: CT HEAD FINDINGS Brain: No evidence of acute infarction, hemorrhage, hydrocephalus, extra-axial collection or mass lesion/mass effect. Advanced cerebral atrophy and chronic microvascular ischemic changes of the white matter, unchanged. Vascular: No hyperdense vessel or unexpected calcification. Skull: Normal. Negative for fracture or focal lesion. Sinuses/Orbits: No acute finding. Other: None. CT CERVICAL SPINE FINDINGS Alignment: Straightening of the cervical spine. Mild anterolisthesis of C4 and mild retrolisthesis of C5, unchanged. Skull base and vertebrae: No acute fracture. No primary bone lesion or focal pathologic process. Soft tissues and spinal canal: No prevertebral fluid or swelling. No visible canal hematoma. Disc levels: C2-C3: Disc osteophyte complex with mild left neural foraminal stenosis. C3-C4: Disc osteophyte complex with mild left and moderate to severe right neural foraminal stenosis. C4-C5: Disc osteophyte complex with mild right and moderate left neural foraminal stenosis. C5-C6: Disc osteophyte complex with marked spinal canal stenosis. Severe bilateral neural foraminal stenosis. C6-C7: Disc osteophyte complex with moderate spinal canal and moderate to severe bilateral neural foraminal stenosis. C7-T1:  No significant spinal canal or neural foraminal stenosis. Upper chest: Emphysematous changes of the lung apices. Other: None IMPRESSION: CT head: 1. No acute intracranial abnormality. 2. Advanced cerebral atrophy and chronic microvascular ischemic changes of the white matter. CT cervical spine: 3.  No acute fracture. 4. Advanced degenerate disc disease most prominent at C5-C6 and C6-C7 with moderate spinal canal and bilateral neural foraminal stenosis. Electronically Signed   By: Larose Hires D.O.   On: 09/30/2021 12:32  ? ?CT Cervical Spine Wo Contrast ? ?Result  Date: 09/30/2021 ?CLINICAL DATA:  Fall.  Patient had a fall on Friday. EXAM: CT HEAD WITHOUT CONTRAST CT CERVICAL SPINE WITHOUT CONTRAST TECHNIQUE: Multidetector CT imaging of the head and cervical spine was performed following the standard protocol without intravenous contrast. Multiplanar CT image reconstructions of the cervical spine were also generated. RADIATION DOSE REDUCTION: This exam was performed according to the departmental dose-optimization program which includes automated exposure control, adjustment of the mA and/or kV according to patient size and/or use of iterative reconstruction technique. COMPARISON:  CT head and C-spine dated March 08, 2020 FINDINGS: CT HEAD FINDINGS Brain: No evidence of acute infarction, hemorrhage, hydrocephalus, extra-axial collection or mass lesion/mass effect. Advanced cerebral atrophy and chronic microvascular ischemic changes of the white matter, unchanged. Vascular: No hyperdense vessel or unexpected calcification. Skull: Normal. Negative for fracture or focal lesion. Sinuses/Orbits: No acute finding. Other: None. CT  CERVICAL SPINE FINDINGS Alignment: Straightening of the cervical spine. Mild anterolisthesis of C4 and mild retrolisthesis of C5, unchanged. Skull base and vertebrae: No acute fracture. No primary bone lesion or focal pathologic process. Soft tissues and spinal canal: No prevertebral fluid or swelling. No visible canal hematoma. Disc levels: C2-C3: Disc osteophyte complex with mild left neural foraminal stenosis. C3-C4: Disc osteophyte complex with mild left and moderate to severe right neural foraminal stenosis. C4-C5: Disc osteophyte complex with mild right and moderate left neural foraminal stenosis. C5-C6: Disc osteophyte complex with marked spinal canal stenosis. Severe bilateral neural foraminal stenosis. C6-C7: Disc osteophyte complex with moderate spinal canal and moderate to severe bilateral neural foraminal stenosis. C7-T1:  No significant spinal  canal or neural foraminal stenosis. Upper chest: Emphysematous changes of the lung apices. Other: None IMPRESSION: CT head: 1. No acute intracranial abnormality. 2. Advanced cerebral atrophy and chronic microvas

## 2021-10-01 ENCOUNTER — Observation Stay: Payer: Medicaid Other

## 2021-10-01 DIAGNOSIS — D649 Anemia, unspecified: Secondary | ICD-10-CM | POA: Diagnosis present

## 2021-10-01 DIAGNOSIS — M7989 Other specified soft tissue disorders: Secondary | ICD-10-CM

## 2021-10-01 DIAGNOSIS — R55 Syncope and collapse: Secondary | ICD-10-CM | POA: Diagnosis not present

## 2021-10-01 DIAGNOSIS — S32409A Unspecified fracture of unspecified acetabulum, initial encounter for closed fracture: Secondary | ICD-10-CM | POA: Diagnosis present

## 2021-10-01 LAB — FOLATE: Folate: 17.9 ng/mL (ref >5.9)

## 2021-10-01 LAB — CBC
HCT: 26.7 % — ABNORMAL LOW (ref 39.0–52.0)
Hemoglobin: 8.8 g/dL — ABNORMAL LOW (ref 13.0–17.0)
MCH: 33.8 pg (ref 26.0–34.0)
MCHC: 33 g/dL (ref 30.0–36.0)
MCV: 102.7 fL — ABNORMAL HIGH (ref 80.0–100.0)
Platelets: 121 10*3/uL — ABNORMAL LOW (ref 150–400)
RBC: 2.6 MIL/uL — ABNORMAL LOW (ref 4.22–5.81)
RDW: 13.8 % (ref 11.5–15.5)
WBC: 6.6 10*3/uL (ref 4.0–10.5)
nRBC: 0 % (ref 0.0–0.2)

## 2021-10-01 LAB — FERRITIN: Ferritin: 170 ng/mL (ref 24–336)

## 2021-10-01 LAB — IRON AND TIBC
Iron: 33 ug/dL — ABNORMAL LOW (ref 45–182)
Saturation Ratios: 14 % — ABNORMAL LOW (ref 17.9–39.5)
TIBC: 230 ug/dL — ABNORMAL LOW (ref 250–450)
UIBC: 197 ug/dL

## 2021-10-01 LAB — COMPREHENSIVE METABOLIC PANEL
ALT: 22 U/L (ref 0–44)
AST: 31 U/L (ref 15–41)
Albumin: 3.1 g/dL — ABNORMAL LOW (ref 3.5–5.0)
Alkaline Phosphatase: 47 U/L (ref 38–126)
Anion gap: 8 (ref 5–15)
BUN: 14 mg/dL (ref 6–20)
CO2: 26 mmol/L (ref 22–32)
Calcium: 7.8 mg/dL — ABNORMAL LOW (ref 8.9–10.3)
Chloride: 102 mmol/L (ref 98–111)
Creatinine, Ser: 0.87 mg/dL (ref 0.61–1.24)
GFR, Estimated: >60 mL/min (ref >60)
Glucose, Bld: 93 mg/dL (ref 70–99)
Potassium: 3.5 mmol/L (ref 3.5–5.1)
Sodium: 136 mmol/L (ref 135–145)
Total Bilirubin: 0.9 mg/dL (ref 0.3–1.2)
Total Protein: 5.7 g/dL — ABNORMAL LOW (ref 6.5–8.1)

## 2021-10-01 LAB — RETICULOCYTES
Immature Retic Fract: 32 % — ABNORMAL HIGH (ref 2.3–15.9)
RBC.: 2.61 MIL/uL — ABNORMAL LOW (ref 4.22–5.81)
Retic Count, Absolute: 67.9 10*3/uL (ref 19.0–186.0)
Retic Ct Pct: 2.6 % (ref 0.4–3.1)

## 2021-10-01 LAB — MAGNESIUM: Magnesium: 1.9 mg/dL (ref 1.7–2.4)

## 2021-10-01 MED ORDER — DICLOFENAC SODIUM 1 % EX GEL
2.0000 g | Freq: Four times a day (QID) | CUTANEOUS | Status: DC
  Administered 2021-10-01 – 2021-10-04 (×9): 2 g via TOPICAL
  Filled 2021-10-01 (×2): qty 100

## 2021-10-01 MED ORDER — FOLIC ACID 1 MG PO TABS
1.0000 mg | ORAL_TABLET | Freq: Every day | ORAL | Status: DC
  Administered 2021-10-01 – 2021-10-02 (×2): 1 mg via ORAL
  Filled 2021-10-01 (×2): qty 1

## 2021-10-01 MED ORDER — LORATADINE 10 MG PO TABS
10.0000 mg | ORAL_TABLET | Freq: Every day | ORAL | Status: DC
  Administered 2021-10-02 – 2021-10-04 (×3): 10 mg via ORAL
  Filled 2021-10-01 (×3): qty 1

## 2021-10-01 MED ORDER — LORAZEPAM 2 MG/ML IJ SOLN
1.0000 mg | INTRAMUSCULAR | Status: AC | PRN
  Administered 2021-10-01: 2 mg via INTRAVENOUS
  Filled 2021-10-01: qty 1

## 2021-10-01 MED ORDER — LATANOPROST 0.005 % OP SOLN
1.0000 [drp] | Freq: Every day | OPHTHALMIC | Status: DC
  Administered 2021-10-01 – 2021-10-03 (×3): 1 [drp] via OPHTHALMIC
  Filled 2021-10-01 (×2): qty 2.5

## 2021-10-01 MED ORDER — LORAZEPAM 1 MG PO TABS: 1.0000 mg | ORAL_TABLET | ORAL | Status: AC | PRN

## 2021-10-01 MED ORDER — THIAMINE HCL 100 MG/ML IJ SOLN
100.0000 mg | Freq: Every day | INTRAMUSCULAR | Status: DC
  Administered 2021-10-01: 100 mg via INTRAVENOUS
  Filled 2021-10-01: qty 2

## 2021-10-01 MED ORDER — THIAMINE HCL 100 MG PO TABS
100.0000 mg | ORAL_TABLET | Freq: Every day | ORAL | Status: DC
  Administered 2021-10-02 – 2021-10-04 (×3): 100 mg via ORAL
  Filled 2021-10-01 (×3): qty 1

## 2021-10-01 MED ORDER — FLUTICASONE FUROATE-VILANTEROL 200-25 MCG/ACT IN AEPB
1.0000 | INHALATION_SPRAY | Freq: Every day | RESPIRATORY_TRACT | Status: DC
Start: 2021-10-02 — End: 2021-10-04
  Administered 2021-10-02 – 2021-10-04 (×3): 1 via RESPIRATORY_TRACT
  Filled 2021-10-01 (×2): qty 28

## 2021-10-01 MED ORDER — GABAPENTIN 300 MG PO CAPS
900.0000 mg | ORAL_CAPSULE | Freq: Three times a day (TID) | ORAL | Status: DC
  Administered 2021-10-01 – 2021-10-04 (×9): 900 mg via ORAL
  Filled 2021-10-01 (×9): qty 3

## 2021-10-01 MED ORDER — QUETIAPINE FUMARATE ER 300 MG PO TB24
300.0000 mg | ORAL_TABLET | Freq: Every day | ORAL | Status: DC
  Administered 2021-10-01 – 2021-10-03 (×3): 300 mg via ORAL
  Filled 2021-10-01 (×4): qty 1

## 2021-10-01 MED ORDER — ADULT MULTIVITAMIN W/MINERALS CH
1.0000 | ORAL_TABLET | Freq: Every day | ORAL | Status: DC
  Administered 2021-10-01 – 2021-10-04 (×4): 1 via ORAL
  Filled 2021-10-01 (×4): qty 1

## 2021-10-01 MED ORDER — OXYCODONE-ACETAMINOPHEN 5-325 MG PO TABS
1.0000 | ORAL_TABLET | ORAL | Status: DC | PRN
  Administered 2021-10-01 – 2021-10-03 (×8): 1 via ORAL
  Filled 2021-10-01 (×9): qty 1

## 2021-10-01 MED ORDER — PANTOPRAZOLE SODIUM 40 MG PO TBEC
40.0000 mg | DELAYED_RELEASE_TABLET | Freq: Every day | ORAL | Status: DC
  Administered 2021-10-01 – 2021-10-04 (×4): 40 mg via ORAL
  Filled 2021-10-01 (×4): qty 1

## 2021-10-01 MED ORDER — KETOROLAC TROMETHAMINE 30 MG/ML IJ SOLN
30.0000 mg | Freq: Three times a day (TID) | INTRAMUSCULAR | Status: DC | PRN
  Administered 2021-10-01 – 2021-10-03 (×4): 30 mg via INTRAVENOUS
  Filled 2021-10-01 (×5): qty 1

## 2021-10-01 MED ORDER — FLUTICASONE PROPIONATE 50 MCG/ACT NA SUSP: 2.0000 | NASAL | Status: DC | PRN

## 2021-10-01 MED ORDER — ALBUTEROL SULFATE (2.5 MG/3ML) 0.083% IN NEBU
3.0000 mL | INHALATION_SOLUTION | Freq: Four times a day (QID) | RESPIRATORY_TRACT | Status: DC | PRN
  Administered 2021-10-02: 3 mL via RESPIRATORY_TRACT
  Filled 2021-10-01: qty 3

## 2021-10-01 MED ORDER — ENOXAPARIN SODIUM 40 MG/0.4ML IJ SOSY
40.0000 mg | PREFILLED_SYRINGE | INTRAMUSCULAR | Status: DC
  Administered 2021-10-01 – 2021-10-03 (×3): 40 mg via SUBCUTANEOUS
  Filled 2021-10-01 (×3): qty 0.4

## 2021-10-01 NOTE — Hospital Course (Addendum)
Taken from H&P. ? ?BRACH PITZEN is a 61 y.o. male with medical history significant of hypertension drug abuse tobacco abuse presenting with a syncopal episode when he was walking. ?Patient reports that he passed out for a minute or 2 when he woke up and came to the hospital.  Patient currently lives with his partner who is also his caretaker for the past 30 years. ?Patient denies any dizziness but according to the partner he takes multiple sedating medications which include trazodone, oxycodone and Seroquel at night and becomes a little wobbly in the morning. ?She does not think that he lost consciousness. ? ?She was concerned about his progressive decline over the past many months, patient at time refused p.o. intake for days and then resume it.  He also has an history of bipolar disorder and follow-up with outpatient psychiatry. ? ?Patient has an history of alcohol and cocaine abuse.  His last alcohol intake was a day prior to the hospitalization, when he had 2 bottles of beer.  Ethanol levels were less than 10.  UDS was negative for cocaine but positive for opioids and tricyclic's. ? ?CT head was without any acute intracranial abnormality, shows advanced cerebral atrophy and chronic microvascular ischemic changes of the white matter. ? ?CT spine was negative for any acute fractures and shows advanced degenerative disc disease most prominent at C5-C6 and ?C6-6 7 levels with moderate spinal cord and bilateral neural foraminal stenosis. ? ?Imaging of left hip with concern of acute posterior column acetabular fracture with recommendation to proceed with CT left hip which was done and shows a posterior acetabular wall fracture with disruption of the ilioischial line consistent with acute posterior column fracture.  Orthopedic surgery was consulted from ED and they recommended conservative management and proceed with physical therapy. ? ?There was also noted a healing intertrochanteric fracture with intact  hardware. ? ?Labs pertinent for normal TSH and mildly elevated free T4. ?Hemoglobin of 9.6 and thrombocytopenia with platelet of 74, no prior diagnosis of liver cirrhosis.  Ordered right upper quadrant ultrasound, which showed hepatic steatosis and hepatomegaly, no cirrhosis. ? ?This morning patient was having significant pain involving left side of body, left hip and left arm.  Significant bruising of left elbow and humeral shaft.  Ordered shoulder and elbow imaging. ?Which are negative for any acute fracture. ? ?PT/OT recommending SNF-TOC is working on it. ? ?Echocardiogram with normal EF, grade 1 diastolic dysfunction and no other significant abnormality. ? ?Urine culture with E. coli which shows resistance to ampicillin, Cipro floxacillin and Bactrim.  He received ceftriaxone which was transitioned to Keflex to complete a 5-day course. ? ?Patient also has macrocytic anemia, anemia panel with concern of anemia of chronic disease and mild iron deficiency.  He was started on supplement.  Alcohol abuse is a contributory factor. ?For concern of liver cirrhosis we obtained right upper quadrant ultrasound which shows hepatic steatosis and hepatomegaly.  No definitive contorted nodularity to confirm cirrhosis.  But patient will remain high risk and need continuous counseling for alcohol cessation. ? ?Medically stable for discharge to rehab. ? ?He will continue current medications and need to have a close follow-up with his primary care provider and orthopedic surgery. ? ? ? ?

## 2021-10-01 NOTE — Evaluation (Signed)
Occupational Therapy Evaluation ?Patient Details ?Name: Perry Bullock ?MRN: [id number] ?DOB: 18-Apr-1961 ?Today's Date: 10/01/2021 ? ? ?History of Present Illness Pt is a 61 y.o. 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  MD assessment includes Thrombocytopenia, syncope with a fall, UTI, and L hip acetabular fracture to be managed conservatively.  ? ?Clinical Impression ?  ?Pt was seen for OT evaluation this date. Prior to fall that led to this hospital admission, pt was ambulating with a SPC in the home and RW outside the home for mobility, indep with basic ADL, however, spouse reports he wasn't bathing much at all lately, and pt agrees. Pt lives with his significant other in a rented home with 3 steps and R/L rails (too far apart to really use together). Spouse present for beginning of session and assisted with PLOF/home set up. Currently pt demonstrates impairments as described below (See OT problem list) which functionally limit his ability to perform ADL/self-care tasks. Pt currently requires mod indep with bed mobility, CGA for ADL transfers with RW and VC for maintaining TTWBing after initial instruction. Pt demo's significant difficulty with maintaining TTWBing while seated EOB and attempted to perform RLE dressing task, ultimately requiring MODA  For LB ADL.  Pt would benefit from skilled OT services to address noted impairments and functional limitations (see below for any additional details) in order to maximize safety and independence while minimizing falls risk and caregiver burden. Upon hospital discharge, recommend STR to maximize pt safety and return to PLOF.    ? ?Recommendations for follow up therapy are one component of a multi-disciplinary discharge planning process, led by the attending physician.  Recommendations may be updated based on patient status, additional  functional criteria and insurance authorization.  ? ?Follow Up Recommendations ? Skilled nursing-short term rehab (<3 hours/day)  ?  ?Assistance Recommended at Discharge Frequent or constant Supervision/Assistance  ?Patient can return home with the following A little help with walking and/or transfers;A lot of help with bathing/dressing/bathroom;Assistance with cooking/housework;Assist for transportation;Help with stairs or ramp for entrance ? ?  ?Functional Status Assessment ? Patient has had a recent decline in their functional status and demonstrates the ability to make significant improvements in function in a reasonable and predictable amount of time.  ?Equipment Recommendations ? Other (comment) (reacher, LH sponge)  ?  ?Recommendations for Other Services   ? ? ?  ?Precautions / Restrictions Precautions ?Precautions: Fall ?Restrictions ?Weight Bearing Restrictions: Yes ?LLE Weight Bearing: Touchdown weight bearing ?Other Position/Activity Restrictions: TTWB per MD note  ? ?  ? ?Mobility Bed Mobility ?Overal bed mobility: Modified Independent ?  ?  ?  ?  ?  ?  ?General bed mobility comments: Min extra time and effort only ?  ? ?Transfers ?Overall transfer level: Needs assistance ?Equipment used: Rolling walker (2 wheels) ?Transfers: Sit to/from Stand ?Sit to Stand: Min guard, From elevated surface ?  ?  ?  ?  ?  ?General transfer comment: Mod verbal cues for sequencing for LLE WB compliance ?  ? ?  ?Balance Overall balance assessment: Needs assistance, History of Falls ?Sitting-balance support: Single extremity supported ?Sitting balance-Leahy Scale: Good ?  ?  ?Standing balance support: Bilateral upper extremity supported, During functional activity, Reliant on assistive device for balance ?Standing balance-Leahy Scale: Fair ?  ?  ?  ?  ?  ?  ?  ?  ?  ?  ?  ?  ?   ? ?  ADL either performed or assessed with clinical judgement  ? ?ADL Overall ADL's : Needs assistance/impaired ?  ?  ?  ?  ?  ?  ?  ?  ?  ?  ?  ?  ?   ?  ?  ?  ?  ?  ?  ?General ADL Comments: Pt currently requires grossly MOD A for LB ADL tasks, unable to maintain TTWBing on LLE while seated EOB attempting to manage R sock. Can perform UB ADL Tasks while seated without assist. CGA For ADL transfers with VC for TTWBing.  ? ? ? ?Vision   ?   ?   ?Perception   ?  ?Praxis   ?  ? ?Pertinent Vitals/Pain Pain Assessment ?Pain Assessment: 0-10 ?Pain Score: 6  ?Pain Location: L hip ?Pain Descriptors / Indicators: Sore ?Pain Intervention(s): Monitored during session, Premedicated before session, Repositioned  ? ? ? ?Hand Dominance Right ?  ?Extremity/Trunk Assessment Upper Extremity Assessment ?Upper Extremity Assessment: Generalized weakness (LUE with skin tears and bruising, bandaged) ?  ?Lower Extremity Assessment ?Lower Extremity Assessment: Generalized weakness;LLE deficits/detail ?LLE: Unable to fully assess due to pain ?  ?  ?  ?Communication Communication ?Communication: No difficulties ?  ?Cognition Arousal/Alertness: Awake/alert ?Behavior During Therapy: [facility] for tasks assessed/performed ?Overall Cognitive Status: Within Functional Limits for tasks assessed ?  ?  ?  ?  ?  ?  ?  ?  ?  ?  ?  ?  ?  ?  ?  ?  ?  ?  ?  ?General Comments    ? ?  ?Exercises Other Exercises ?Other Exercises: Pt instructed in TTWBing precautions and how to maintain during ADL transfers and mobility ?  ?Shoulder Instructions    ? ? ?Home Living Family/patient expects to be discharged to:: Private residence ?Living Arrangements: Spouse/significant other ?Available Help at Discharge: Family;Available 24 hours/day ?Type of Home: House ?Home Access: Stairs to enter ?Entrance Stairs-Number of Steps: 3 ?Entrance Stairs-Rails: Right;Left ?Home Layout: One level ?  ?  ?Bathroom Shower/Tub: Tub/shower unit ?  ?Bathroom Toilet: Standard ?  ?  ?Home Equipment: BSC/3in1;Rolling Walker (2 wheels);Cane - single point;Cane - quad;Grab bars - tub/shower;Other (comment);Rollator (4 wheels) ?  ?Additional  Comments: tub transfer bench ?  ? ?  ?Prior Functioning/Environment Prior Level of Function : Needs assist ?  ?  ?  ?  ?  ?  ?Mobility Comments: Mod Ind amb with a SPC in the home and RW limited community distances, remote history of multiple falls but no other falls other than current one in the last 6 months ?ADLs Comments: Pt endorses little to no bathing recently ?  ? ?  ?  ?OT Problem List: Decreased strength;Pain;Impaired balance (sitting and/or standing);Decreased knowledge of use of DME or AE;Decreased knowledge of precautions ?  ?   ?OT Treatment/Interventions: Self-care/ADL training;Therapeutic exercise;Therapeutic activities;Balance training;Patient/family education;DME and/or AE instruction  ?  ?OT Goals(Current goals can be found in the care plan section) Acute Rehab OT Goals ?Patient Stated Goal: get stronger at rehab then go home ?OT Goal Formulation: With patient ?Time For Goal Achievement: 10/15/21 ?Potential to Achieve Goals: Good ?ADL Goals ?Pt Will Perform Lower Body Dressing: with modified independence;sit to/from stand ?Pt Will Transfer to Toilet: with modified independence;ambulating (LRAD, maintaining TTWBing LLE) ?Additional ADL Goal #1: Pt will complete seated bathing with supervision for TTB transfer and remote supervision for safety, maintaining LLE TTWBing. ?Additional ADL Goal #2: Pt will verbalize plan to implement at least  2 learned falls prevention strategies to maximize safety/indep with ADL/mobility at home.  ?OT Frequency: Min 2X/week ?  ? ?Co-evaluation PT/OT/SLP Co-Evaluation/Treatment: Yes ?Reason for Co-Treatment: For patient/therapist safety;To address functional/ADL transfers ?PT goals addressed during session: Mobility/safety with mobility;Proper use of DME ?OT goals addressed during session: ADL's and self-care ?  ? ?  ?AM-PAC OT "6 Clicks" Daily Activity     ?Outcome Measure Help from another person eating meals?: None ?Help from another person taking care of personal  grooming?: A Little ?Help from another person toileting, which includes using toliet, bedpan, or urinal?: A Little ?Help from another person bathing (including washing, rinsing, drying)?: A Lot ?Help from a

## 2021-10-01 NOTE — Assessment & Plan Note (Addendum)
Differentials include TIA, CVA, dysrhythmia, alcohol and cocaine abuse. ?Patient also take multiple medications which can impair his balance as told by his partner. ?EKG with normal sinus rhythm and right bundle branch block.  ?Ethanol level and UDS negative for cocaine but positive for opioids and tricyclic. ?Echocardiogram pending. ?PT/OT pending ?

## 2021-10-01 NOTE — Assessment & Plan Note (Signed)
Urine drug screen is negative today. ? ?

## 2021-10-01 NOTE — Assessment & Plan Note (Signed)
Continue home Seroquel. 

## 2021-10-01 NOTE — ED Notes (Signed)
MD Amin at bedside.

## 2021-10-01 NOTE — Assessment & Plan Note (Addendum)
Patient states the last time he drank was yesterday where he had 2 bottles of beer. ?Magnesium as expected low end and was replaced. ?-Continue with CIWA protocol ?-Continue with thiamine ?

## 2021-10-01 NOTE — Assessment & Plan Note (Addendum)
?    Latest Ref Rng & Units 09/30/2021  ?  5:16 PM 03/14/2020  ?  3:57 AM 03/13/2020  ?  6:24 AM  ?CBC  ?WBC 4.0 - 10.5 K/uL 7.3   10.2   18.1    ?Hemoglobin 13.0 - 17.0 g/dL 9.6   8.1   8.6    ?Hematocrit 39.0 - 52.0 % 29.2   24.9   26.0    ?Platelets 150 - 400 K/uL 74   215   172    ? ?Hemoglobin of 9.6>>8.8  ?-Continue to monitor ?-Anemia panel ?-Transfuse if below 7 ? ?

## 2021-10-01 NOTE — ED Notes (Signed)
Pt states that his pain is unchanged with tylenol, no other PRN medication ordered for pain. Message sent to Dr. Reesa Chew requesting additional PRN pain medication be ordered. ?

## 2021-10-01 NOTE — ED Notes (Signed)
PT at bedisde

## 2021-10-01 NOTE — Assessment & Plan Note (Addendum)
-  Nicotine patch 

## 2021-10-01 NOTE — Assessment & Plan Note (Signed)
No wheezing or shortness of breath. ?-Continue home inhalers ?

## 2021-10-01 NOTE — Assessment & Plan Note (Signed)
Patient with significant left upper extremity edema and bruising around elbow and humeral shaft. ?-DG left shoulder and elbow ordered. ?-Continue with pain management ?

## 2021-10-01 NOTE — Plan of Care (Signed)
  Problem: Health Behavior/Discharge Planning: Goal: Ability to manage health-related needs will improve Outcome: Progressing   

## 2021-10-01 NOTE — Assessment & Plan Note (Signed)
UA concerning for UTI with large leukocytosis.  Urine cultures pending.  History of positive urine cultures Klebsiella pneumonia in September 2021, good sensitivity.  He was started on ceftriaxone. ?-Per patient he does has some dysuria and increased urinary frequency ?-Continue ceftriaxone ?-Follow-up urine cultures ?

## 2021-10-01 NOTE — Evaluation (Signed)
Physical Therapy Evaluation ?Patient Details ?Name: Perry Bullock ?MRN: [id number] ?DOB: 09-01-1960 ?Today's Date: 10/01/2021 ? ?History of Present Illness ? Pt is a 61 y.o. 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  MD assessment includes Thrombocytopenia, syncope with a fall, UTI, and L hip acetabular fracture to be managed conservatively. ?  ?Clinical Impression ? Pt was pleasant and motivated to participate during the session and put forth good effort throughout. Pt required no physical assistance during the session but did require extra time, effort, and cuing with functional tasks to ensure proper sequencing for WB compliance.  Pt was able to amb a max of around 15 feet with a  hop-to pattern and excellent WB compliance.  Pt is at a very high risk for falls and poor WB compliance without skilled supervision, however, and would not be safe to return to his prior living situation at this time.  Pt will benefit from PT services in a SNF setting upon discharge to safely address deficits listed in patient problem list for decreased caregiver assistance and eventual return to PLOF. ? ?   ?   ? ?Recommendations for follow up therapy are one component of a multi-disciplinary discharge planning process, led by the attending physician.  Recommendations may be updated based on patient status, additional functional criteria and insurance authorization. ? ?Follow Up Recommendations Skilled nursing-short term rehab (<3 hours/day) ? ?  ?Assistance Recommended at Discharge Frequent or constant Supervision/Assistance  ?Patient can return home with the following ? A lot of help with walking and/or transfers;A lot of help with bathing/dressing/bathroom;Assistance with cooking/housework;Direct supervision/assist for medications management;Direct supervision/assist for financial management;Assist  for transportation;Help with stairs or ramp for entrance ? ?  ?Equipment Recommendations None recommended by PT  ?Recommendations for Other Services ?    ?  ?Functional Status Assessment Patient has had a recent decline in their functional status and demonstrates the ability to make significant improvements in function in a reasonable and predictable amount of time.  ? ?  ?Precautions / Restrictions Precautions ?Precautions: Fall ?Restrictions ?Weight Bearing Restrictions: Yes ?LLE Weight Bearing: Touchdown weight bearing  ? ?  ? ?Mobility ? Bed Mobility ?Overal bed mobility: Modified Independent ?  ?  ?  ?  ?  ?  ?General bed mobility comments: Min extra time and effort only ?  ? ?Transfers ?Overall transfer level: Needs assistance ?Equipment used: Rolling walker (2 wheels) ?Transfers: Sit to/from Stand ?Sit to Stand: Min guard, From elevated surface ?  ?  ?  ?  ?  ?General transfer comment: Mod verbal cues for sequencing for LLE WB compliance ?  ? ?Ambulation/Gait ?Ambulation/Gait assistance: Min guard ?Gait Distance (Feet): 15 Feet ?Assistive device: Rolling walker (2 wheels) ?  ?Gait velocity: decreased ?  ?  ?General Gait Details: Pt able to amb grossly 15 feet with hop-to gait pattern with excellent WB compliance throughout ? ?Stairs ?  ?  ?  ?  ?  ? ?Wheelchair Mobility ?  ? ?Modified Rankin (Stroke Patients Only) ?  ? ?  ? ?Balance Overall balance assessment: Needs assistance, History of Falls ?Sitting-balance support: Single extremity supported ?Sitting balance-Leahy Scale: Good ?  ?  ?Standing balance support: Bilateral upper extremity supported, During functional activity, Reliant on assistive device for balance ?Standing balance-Leahy Scale: Fair ?  ?  ?  ?  ?  ?  ?  ?  ?  ?  ?  ?  ?   ? ? ? ?  Pertinent Vitals/Pain Pain Assessment ?Pain Assessment: 0-10 ?Pain Score: 6  ?Pain Location: L hip ?Pain Descriptors / Indicators: Sore ?Pain Intervention(s): Premedicated before session, Monitored during session,  Repositioned  ? ? ?Home Living Family/patient expects to be discharged to:: Private residence ?Living Arrangements: Spouse/significant other ?Available Help at Discharge: Family;Available 24 hours/day ?Type of Home: House ?Home Access: Stairs to enter ?Entrance Stairs-Rails: Right;Left ?Entrance Stairs-Number of Steps: 3 ?  ?Home Layout: One level ?Home Equipment: BSC/3in1;Rolling Walker (2 wheels);Cane - single point;Cane - quad;Grab bars - tub/shower;Other (comment);Rollator (4 wheels) ?Additional Comments: tub transfer bench  ?  ?Prior Function Prior Level of Function : Needs assist ?  ?  ?  ?  ?  ?  ?Mobility Comments: Mod Ind amb with a SPC in the home and RW limited community distances, remote history of multiple falls but no other falls other than current one in the last 6 months ?ADLs Comments: Pt endorses little to no bathing recently ?  ? ? ?Hand Dominance  ? Dominant Hand: Right ? ?  ?Extremity/Trunk Assessment  ? Upper Extremity Assessment ?Upper Extremity Assessment: Generalized weakness ?  ? ?Lower Extremity Assessment ?Lower Extremity Assessment: Generalized weakness;LLE deficits/detail ?LLE: Unable to fully assess due to pain ?  ? ?   ?Communication  ? Communication: No difficulties  ?Cognition Arousal/Alertness: Awake/alert ?Behavior During Therapy: [facility] for tasks assessed/performed ?Overall Cognitive Status: Within Functional Limits for tasks assessed ?  ?  ?  ?  ?  ?  ?  ?  ?  ?  ?  ?  ?  ?  ?  ?  ?  ?  ?  ? ?  ?General Comments   ? ?  ?Exercises    ? ?Assessment/Plan  ?  ?PT Assessment Patient needs continued PT services  ?PT Problem List Decreased strength;Decreased activity tolerance;Decreased balance;Decreased mobility;Decreased knowledge of use of DME;Pain;Decreased knowledge of precautions;Decreased safety awareness ? ?   ?  ?PT Treatment Interventions DME instruction;Gait training;Stair training;Functional mobility training;Therapeutic activities;Therapeutic exercise;Balance  training;Patient/family education   ? ?PT Goals (Current goals can be found in the Care Plan section)  ?Acute Rehab PT Goals ?Patient Stated Goal: To walk better ?PT Goal Formulation: With patient ?Time For Goal Achievement: 10/14/21 ?Potential to Achieve Goals: Good ? ?  ?Frequency 7X/week ?  ? ? ?Co-evaluation PT/OT/SLP Co-Evaluation/Treatment: Yes ?Reason for Co-Treatment: For patient/therapist safety;To address functional/ADL transfers ?PT goals addressed during session: Mobility/safety with mobility;Proper use of DME ?OT goals addressed during session: ADL's and self-care ?  ? ? ?  ?AM-PAC PT "6 Clicks" Mobility  ?Outcome Measure Help needed turning from your back to your side while in a flat bed without using bedrails?: A Little ?Help needed moving from lying on your back to sitting on the side of a flat bed without using bedrails?: A Little ?Help needed moving to and from a bed to a chair (including a wheelchair)?: A Little ?Help needed standing up from a chair using your arms (e.g., wheelchair or bedside chair)?: A Little ?Help needed to walk in hospital room?: A Lot ?Help needed climbing 3-5 steps with a railing? : Total ?6 Click Score: 15 ? ?  ?End of Session Equipment Utilized During Treatment: Gait belt ?Activity Tolerance: Patient tolerated treatment well ?Patient left: in bed;with call bell/phone within reach ?Nurse Communication: Mobility status;Weight bearing status;Other (comment) (Pt c/o superficial sacral pain) ?PT Visit Diagnosis: History of falling (Z91.81);Unsteadiness on feet (R26.81);Difficulty in walking, not elsewhere classified (R26.2);Muscle weakness (generalized) (M62.81);Pain ?Pain -  Right/Left: Left ?Pain - part of body: Hip ?  ? ?Time: [id number] ?PT Time Calculation (min) (ACUTE ONLY): 22 min ? ? ?Charges:   PT Evaluation ?$PT Eval Moderate Complexity: 1 Mod ?  ?  ?   ?D. Elly ModenaScott Jenetta Wease PT, DPT ?10/01/21, 4:26 PM ? ? ? ?

## 2021-10-01 NOTE — Assessment & Plan Note (Addendum)
Acetabular fracture Case has been discussed by ED provider to orthopedics on-call Dr. Odis Luster states that this is conservative management and the fracture will heal on its own.  ?-Pending PT evaluation ?-Continue with pain management ?

## 2021-10-01 NOTE — ED Notes (Addendum)
Pt NAD sitting on side of bed eating lunch. A/ox4, c/o left hip and arm pain s/p fall. -obvious deformity, +pmsc ?

## 2021-10-01 NOTE — Assessment & Plan Note (Addendum)
Platelet counts of 74,000>>121000 ?Suspect patient may have underlying cirrhosis or splenomegaly. ?-Ordered right upper quadrant ultrasound. ?-No prior diagnosis of cirrhosis ?

## 2021-10-01 NOTE — Assessment & Plan Note (Addendum)
Resolved. ?Replace magnesium as needed ?

## 2021-10-01 NOTE — Progress Notes (Signed)
?Progress Note ? ? ?Patient: Perry Bullock JKK:[id number] DOB: 1960-12-31 DOA: 09/30/2021     0 ?DOS: the patient was seen and examined on 10/01/2021 ?  ?Brief hospital course: ?Taken from H&P. ? ?Perry Bullock is a 61 y.o. male with medical history significant of hypertension drug abuse tobacco abuse presenting with a syncopal episode when he was walking. ?Patient reports that he passed out for a minute or 2 when he woke up and came to the hospital.  Patient currently lives with his partner who is also his caretaker for the past 30 years. ?Patient denies any dizziness but according to the partner he takes multiple sedating medications which include trazodone, oxycodone and Seroquel at night and becomes a little wobbly in the morning. ?She does not think that he lost consciousness. ? ?She was concerned about his progressive decline over the past many months, patient at time refused p.o. intake for days and then resume it.  He also has an history of bipolar disorder and follow-up with outpatient psychiatry. ? ?Patient has an history of alcohol and cocaine abuse.  His last alcohol intake was a day prior to the hospitalization, when he had 2 bottles of beer.  Ethanol levels were less than 10.  UDS was negative for cocaine but positive for opioids and tricyclic's. ? ?CT head was without any acute intracranial abnormality, shows advanced cerebral atrophy and chronic microvascular ischemic changes of the white matter. ? ?CT spine was negative for any acute fractures and shows advanced degenerative disc disease most prominent at C5-C6 and ?C6-6 7 levels with moderate spinal cord and bilateral neural foraminal stenosis. ? ?Imaging of left hip with concern of acute posterior column acetabular fracture with recommendation to proceed with CT left hip which was done and shows a posterior acetabular wall fracture with disruption of the ilioischial line consistent with acute posterior column fracture.  Orthopedic surgery was  consulted from ED and they recommended conservative management and proceed with physical therapy. ? ?There was also noted a healing intertrochanteric fracture with intact hardware. ? ?Labs pertinent for normal TSH and mildly elevated free T4. ?Hemoglobin of 9.6 and thrombocytopenia with platelet of 74, no prior diagnosis of liver cirrhosis.  Ordered right upper quadrant ultrasound. ? ?This morning patient was having significant pain involving left side of body, left hip and left arm.  Significant bruising of left elbow and humeral shaft.  Ordered shoulder and elbow imaging. ? ?Pending PT/OT evaluation and echocardiogram ? ? ? ?Assessment and Plan: ?* Syncope and collapse ?Differentials include TIA, CVA, dysrhythmia, alcohol and cocaine abuse. ?Patient also take multiple medications which can impair his balance as told by his partner. ?EKG with normal sinus rhythm and right bundle branch block.  ?Ethanol level and UDS negative for cocaine but positive for opioids and tricyclic. ?Echocardiogram pending. ?PT/OT pending ? ?Acetabular fracture (HCC) ?Acetabular fracture Case has been discussed by ED provider to orthopedics on-call Dr. Odis Luster states that this is conservative management and the fracture will heal on its own.  ?-Pending PT evaluation ?-Continue with pain management ? ?Left upper extremity swelling ?Patient with significant left upper extremity edema and bruising around elbow and humeral shaft. ?-DG left shoulder and elbow ordered. ?-Continue with pain management ? ?Cocaine abuse (HCC) ?Urine drug screen is negative today. ? ? ?Alcohol abuse ?Patient states the last time he drank was yesterday where he had 2 bottles of beer. ?Magnesium as expected low end and was replaced. ?-Continue with CIWA protocol ?-Continue with thiamine ? ?  Anemia ? ?  Latest Ref Rng & Units 09/30/2021  ?  5:16 PM 03/14/2020  ?  3:57 AM 03/13/2020  ?  6:24 AM  ?CBC  ?WBC 4.0 - 10.5 K/uL 7.3   10.2   18.1    ?Hemoglobin 13.0 - 17.0 g/dL 9.6    8.1   8.6    ?Hematocrit 39.0 - 52.0 % 29.2   24.9   26.0    ?Platelets 150 - 400 K/uL 74   215   172    ? ?Hemoglobin of 9.6>>8.8  ?-Continue to monitor ?-Anemia panel ?-Transfuse if below 7 ? ? ?Tobacco abuse ?Nicotine patch.  ? ?Thrombocytopenia (HCC) ?Platelet counts of 74,000>>121000 ?Suspect patient may have underlying cirrhosis or splenomegaly. ?-Ordered right upper quadrant ultrasound. ?-No prior diagnosis of cirrhosis ? ?COPD (chronic obstructive pulmonary disease) (HCC) ?No wheezing or shortness of breath. ?-Continue home inhalers ? ?Hypomagnesemia ?Resolved. ?Replace magnesium as needed ? ?Bipolar disorder (HCC) ?- Continue home Seroquel ? ?UTI (urinary tract infection) ?UA concerning for UTI with large leukocytosis.  Urine cultures pending.  History of positive urine cultures Klebsiella pneumonia in September 2021, good sensitivity.  He was started on ceftriaxone. ?-Per patient he does has some dysuria and increased urinary frequency ?-Continue ceftriaxone ?-Follow-up urine cultures ? ? ?Subjective: Patient was complaining about pain mostly involving the left side of the body. ?Partner at bedside and she was very concerned about his progressively worsening decline over the past 1 year. ? ?Physical Exam: ?Vitals:  ? 09/30/21 2344 10/01/21 0345 10/01/21 0819 10/01/21 1030  ?BP: 112/75 105/81 107/74 102/66  ?Pulse: 84 94 (!) 113 95  ?Resp: 20 16 (!) 22 16  ?Temp:   97.9 ?F (36.6 ?C)   ?TempSrc:   Oral   ?SpO2: 99% 95% 97% 91%  ?Weight:      ?Height:      ? ?General.     In no acute distress. ?Pulmonary.  Lungs clear bilaterally, normal respiratory effort. ?CV.  Regular rate and rhythm, no JVD, rub or murmur. ?Abdomen.  Soft, nontender, nondistended, BS positive. ?CNS.  Alert and oriented .  No focal neurologic deficit. ?Extremities.  No edema, no cyanosis, pulses intact and symmetrical.  Left upper extremity significant edema and ecchymosis involving left elbow and arm. ?Psychiatry.  Judgment and insight  appears impaired ? ?Data Reviewed: ?Prior notes, labs and images reviewed ? ?Family Communication: Discussed with lifetime partner at bedside. ? ?Disposition: ?Status is: Observation ?The patient remains OBS appropriate and will d/c before 2 midnights. ? Planned Discharge Destination: To be determined ? ?DVT prophylaxis.  Lovenox ? ?Time spent: 50 minutes ? ?This record has been created using Conservation officer, historic buildings. Errors have been sought and corrected,but may not always be located. Such creation errors do not reflect on the standard of care. ? ?Author: ?Arnetha Courser, MD ?10/01/2021 3:35 PM ? ?For on call review [email or web address].  ?

## 2021-10-02 DIAGNOSIS — E871 Hypo-osmolality and hyponatremia: Secondary | ICD-10-CM | POA: Diagnosis present

## 2021-10-02 DIAGNOSIS — B9620 Unspecified Escherichia coli [E. coli] as the cause of diseases classified elsewhere: Secondary | ICD-10-CM | POA: Diagnosis present

## 2021-10-02 DIAGNOSIS — F419 Anxiety disorder, unspecified: Secondary | ICD-10-CM | POA: Diagnosis present

## 2021-10-02 DIAGNOSIS — K760 Fatty (change of) liver, not elsewhere classified: Secondary | ICD-10-CM | POA: Diagnosis present

## 2021-10-02 DIAGNOSIS — N390 Urinary tract infection, site not specified: Secondary | ICD-10-CM | POA: Diagnosis present

## 2021-10-02 DIAGNOSIS — E1151 Type 2 diabetes mellitus with diabetic peripheral angiopathy without gangrene: Secondary | ICD-10-CM | POA: Diagnosis present

## 2021-10-02 DIAGNOSIS — W1830XA Fall on same level, unspecified, initial encounter: Secondary | ICD-10-CM | POA: Diagnosis present

## 2021-10-02 DIAGNOSIS — I4510 Unspecified right bundle-branch block: Secondary | ICD-10-CM | POA: Diagnosis present

## 2021-10-02 DIAGNOSIS — D638 Anemia in other chronic diseases classified elsewhere: Secondary | ICD-10-CM | POA: Diagnosis present

## 2021-10-02 DIAGNOSIS — R160 Hepatomegaly, not elsewhere classified: Secondary | ICD-10-CM | POA: Diagnosis present

## 2021-10-02 DIAGNOSIS — G8929 Other chronic pain: Secondary | ICD-10-CM | POA: Diagnosis present

## 2021-10-02 DIAGNOSIS — S5002XA Contusion of left elbow, initial encounter: Secondary | ICD-10-CM | POA: Diagnosis present

## 2021-10-02 DIAGNOSIS — M5136 Other intervertebral disc degeneration, lumbar region: Secondary | ICD-10-CM | POA: Diagnosis present

## 2021-10-02 DIAGNOSIS — F1010 Alcohol abuse, uncomplicated: Secondary | ICD-10-CM | POA: Diagnosis present

## 2021-10-02 DIAGNOSIS — R55 Syncope and collapse: Secondary | ICD-10-CM | POA: Diagnosis present

## 2021-10-02 DIAGNOSIS — I10 Essential (primary) hypertension: Secondary | ICD-10-CM | POA: Diagnosis present

## 2021-10-02 DIAGNOSIS — F319 Bipolar disorder, unspecified: Secondary | ICD-10-CM | POA: Diagnosis present

## 2021-10-02 DIAGNOSIS — S41111A Laceration without foreign body of right upper arm, initial encounter: Secondary | ICD-10-CM | POA: Diagnosis present

## 2021-10-02 DIAGNOSIS — S32445A Nondisplaced fracture of posterior column [ilioischial] of left acetabulum, initial encounter for closed fracture: Secondary | ICD-10-CM | POA: Diagnosis present

## 2021-10-02 DIAGNOSIS — H5461 Unqualified visual loss, right eye, normal vision left eye: Secondary | ICD-10-CM | POA: Diagnosis present

## 2021-10-02 DIAGNOSIS — F17210 Nicotine dependence, cigarettes, uncomplicated: Secondary | ICD-10-CM | POA: Diagnosis present

## 2021-10-02 DIAGNOSIS — M1990 Unspecified osteoarthritis, unspecified site: Secondary | ICD-10-CM | POA: Diagnosis present

## 2021-10-02 DIAGNOSIS — J449 Chronic obstructive pulmonary disease, unspecified: Secondary | ICD-10-CM | POA: Diagnosis present

## 2021-10-02 DIAGNOSIS — D696 Thrombocytopenia, unspecified: Secondary | ICD-10-CM | POA: Diagnosis present

## 2021-10-02 LAB — VITAMIN B12: Vitamin B-12: 354 pg/mL (ref 180–914)

## 2021-10-02 LAB — GLUCOSE, CAPILLARY: Glucose-Capillary: 129 mg/dL — ABNORMAL HIGH (ref 70–99)

## 2021-10-02 LAB — HEMOGLOBIN A1C
Hgb A1c MFr Bld: 4.6 % — ABNORMAL LOW (ref 4.8–5.6)
Mean Plasma Glucose: 85 mg/dL

## 2021-10-02 LAB — HIV ANTIBODY (ROUTINE TESTING W REFLEX): HIV Screen 4th Generation wRfx: NONREACTIVE

## 2021-10-02 MED ORDER — FE FUMARATE-B12-VIT C-FA-IFC PO CAPS
1.0000 | ORAL_CAPSULE | Freq: Two times a day (BID) | ORAL | Status: DC
  Administered 2021-10-02 – 2021-10-04 (×4): 1 via ORAL
  Filled 2021-10-02 (×5): qty 1

## 2021-10-02 MED ORDER — BISACODYL 5 MG PO TBEC: 5.0000 mg | DELAYED_RELEASE_TABLET | Freq: Every day | ORAL | Status: DC | PRN

## 2021-10-02 NOTE — Assessment & Plan Note (Signed)
Differentials include TIA, CVA, dysrhythmia, alcohol and cocaine abuse. ?Patient also take multiple medications which can impair his balance as told by his partner. ?EKG with normal sinus rhythm and right bundle branch block.  ?Ethanol level and UDS negative for cocaine but positive for opioids and tricyclic. ?Echocardiogram pending. ?PT/OT are recommending SNF. ?TOC is working on it ?

## 2021-10-02 NOTE — Progress Notes (Signed)
?Progress Note ? ? ?Patient: Perry Bullock [id number] DOB: [birth year] DOA: 09/30/2021     0 ?DOS: the patient was seen and examined on 10/02/2021 ?  ?Brief hospital course: ?Taken from H&P. ? ?Perry Bullock is a 61 y.o. male with medical history significant of hypertension drug abuse tobacco abuse presenting with a syncopal episode when he was walking. ?Patient reports that he passed out for a minute or 2 when he woke up and came to the hospital.  Patient currently lives with his partner who is also his caretaker for the past 30 years. ?Patient denies any dizziness but according to the partner he takes multiple sedating medications which include trazodone, oxycodone and Seroquel at night and becomes a little wobbly in the morning. ?She does not think that he lost consciousness. ? ?She was concerned about his progressive decline over the past many months, patient at time refused p.o. intake for days and then resume it.  He also has an history of bipolar disorder and follow-up with outpatient psychiatry. ? ?Patient has an history of alcohol and cocaine abuse.  His last alcohol intake was a day prior to the hospitalization, when he had 2 bottles of beer.  Ethanol levels were less than 10.  UDS was negative for cocaine but positive for opioids and tricyclic's. ? ?CT head was without any acute intracranial abnormality, shows advanced cerebral atrophy and chronic microvascular ischemic changes of the white matter. ? ?CT spine was negative for any acute fractures and shows advanced degenerative disc disease most prominent at C5-C6 and ?C6-6 7 levels with moderate spinal cord and bilateral neural foraminal stenosis. ? ?Imaging of left hip with concern of acute posterior column acetabular fracture with recommendation to proceed with CT left hip which was done and shows a posterior acetabular wall fracture with disruption of the ilioischial line consistent with acute posterior column fracture.  Orthopedic surgery was  consulted from ED and they recommended conservative management and proceed with physical therapy. ? ?There was also noted a healing intertrochanteric fracture with intact hardware. ? ?Labs pertinent for normal TSH and mildly elevated free T4. ?Hemoglobin of 9.6 and thrombocytopenia with platelet of 74, no prior diagnosis of liver cirrhosis.  Ordered right upper quadrant ultrasound, which showed hepatic steatosis and hepatomegaly, no cirrhosis. ? ?This morning patient was having significant pain involving left side of body, left hip and left arm.  Significant bruising of left elbow and humeral shaft.  Ordered shoulder and elbow imaging. ?Which are negative for any acute fracture. ? ?PT/OT recommending SNF-TOC is working on it. ? ?Echocardiogram still pending. ? ? ? ? ?Assessment and Plan: ?* Syncope and collapse ?Differentials include TIA, CVA, dysrhythmia, alcohol and cocaine abuse. ?Patient also take multiple medications which can impair his balance as told by his partner. ?EKG with normal sinus rhythm and right bundle branch block.  ?Ethanol level and UDS negative for cocaine but positive for opioids and tricyclic. ?Echocardiogram pending. ?PT/OT are recommending SNF. ?TOC is working on it ? ?Acetabular fracture (HCC) ?Acetabular fracture Case has been discussed by ED provider to orthopedics on-call Dr. Odis Luster states that this is conservative management and the fracture will heal on its own.  ?-Pending PT evaluation ?-Continue with pain management ? ?Left upper extremity swelling ?Patient with significant left upper extremity edema and bruising around elbow and humeral shaft. ?-DG left shoulder and elbow was negative for any acute fractures. ?-Continue with pain management ? ?Cocaine abuse (HCC) ?Urine drug screen is negative today. ? ? ?  Alcohol abuse ?Patient states the last time he drank was yesterday where he had 2 bottles of beer. ?Magnesium as expected low end and was replaced. ?-Continue with CIWA  protocol ?-Continue with thiamine ? ?Anemia ? ?  Latest Ref Rng & Units 09/30/2021  ?  5:16 PM 03/14/2020  ?  3:57 AM 03/13/2020  ?  6:24 AM  ?CBC  ?WBC 4.0 - 10.5 K/uL 7.3   10.2   18.1    ?Hemoglobin 13.0 - 17.0 g/dL 9.6   8.1   8.6    ?Hematocrit 39.0 - 52.0 % 29.2   24.9   26.0    ?Platelets 150 - 400 K/uL 74   215   172    ? ?Hemoglobin of 9.6>>8.8  ?-Continue to monitor ?-Anemia panel ?-Transfuse if below 7 ? ? ?Tobacco abuse ?Nicotine patch.  ? ?Thrombocytopenia (HCC) ?Platelet counts of 74,000>>121000 ?Right upper quadrant ultrasound shows hepatic steatosis and hepatomegaly, no cirrhosis. ?-Continue to monitor ? ?COPD (chronic obstructive pulmonary disease) (HCC) ?No wheezing or shortness of breath. ?-Continue home inhalers ? ?Hypomagnesemia ?Resolved. ?Replace magnesium as needed ? ?Bipolar disorder (HCC) ?- Continue home Seroquel ? ?UTI (urinary tract infection) ?UA concerning for UTI with large leukocytosis.  Urine cultures pending.  History of positive urine cultures Klebsiella pneumonia in September 2021, good sensitivity.  He was started on ceftriaxone. ?Per patient he does has some dysuria and increased urinary frequency ?Current urine cultures with E. coli, pending susceptibility. ?-Continue ceftriaxone ?-Follow-up final urine cultures result ? ? ?Subjective: Patient was seen and examined today.  He continued to have left hip pain which is causing difficulty with ambulation.  We discussed about outpatient orthopedic follow-up as recommended by them.  Patient will make appointment with Dr.Poggi who is his orthopedic surgeon ? ?Physical Exam: ?Vitals:  ? 10/02/21 0415 10/02/21 0419 10/02/21 0844 10/02/21 1149  ?BP: 106/69  122/78 115/71  ?Pulse: 98  98 100  ?Resp: 16  17 17   ?Temp: 97.7 ?F (36.5 ?C)  97.8 ?F (36.6 ?C) 97.7 ?F (36.5 ?C)  ?TempSrc: Oral  Oral Oral  ?SpO2: 100%  99% 100%  ?Weight:  65 kg    ?Height:      ? ?General.     In no acute distress. ?Pulmonary.  Lungs clear bilaterally, normal  respiratory effort. ?CV.  Regular rate and rhythm, no JVD, rub or murmur. ?Abdomen.  Soft, nontender, nondistended, BS positive. ?CNS.  Alert and oriented .  No focal neurologic deficit. ?Extremities.  No edema, no cyanosis, pulses intact and symmetrical. ?Psychiatry.  Judgment and insight appears normal. ? ?Data Reviewed: ?Prior notes, labs and images reviewed ? ?Family Communication: Discussed with patient ? ?Disposition: ?Status is: Inpatient ?Remains inpatient appropriate because: Severity of illness, waiting for SNF placement ? ? Planned Discharge Destination: Skilled nursing facility ? ?DVT prophylaxis.  Lovenox ? ?Time spent: 45 minutes ? ?This record has been created using . Errors have been sought and corrected,but may not always be located. Such creation errors do not reflect on the standard of care. ? ?Author: ?Conservation officer, historic buildings, MD ?10/02/2021 3:17 PM ? ?For on call review [email or web address].  ?

## 2021-10-02 NOTE — Assessment & Plan Note (Signed)
Platelet counts of 74,000>>121000 ?Right upper quadrant ultrasound shows hepatic steatosis and hepatomegaly, no cirrhosis. ?-Continue to monitor ?

## 2021-10-02 NOTE — Assessment & Plan Note (Signed)
Resolved. ?Replace magnesium as needed ?

## 2021-10-02 NOTE — Assessment & Plan Note (Signed)
Patient with significant left upper extremity edema and bruising around elbow and humeral shaft. ?-DG left shoulder and elbow was negative for any acute fractures. ?-Continue with pain management ?

## 2021-10-02 NOTE — Assessment & Plan Note (Signed)
UA concerning for UTI with large leukocytosis.  Urine cultures pending.  History of positive urine cultures Klebsiella pneumonia in September 2021, good sensitivity.  He was started on ceftriaxone. ?Per patient he does has some dysuria and increased urinary frequency ?Current urine cultures with E. coli, pending susceptibility. ?-Continue ceftriaxone ?-Follow-up final urine cultures result ?

## 2021-10-02 NOTE — Assessment & Plan Note (Signed)
Patient states the last time he drank was yesterday where he had 2 bottles of beer. ?Magnesium as expected low end and was replaced. ?-Continue with CIWA protocol ?-Continue with thiamine ?

## 2021-10-02 NOTE — Progress Notes (Signed)
CSW has faxed requested clinicals to [place] must for PASRR number. ' ? ?Pending PASRR number at this time.  ? Angeline Slim, [place] ?[phone] ? ?

## 2021-10-02 NOTE — Progress Notes (Signed)
Physical Therapy Treatment ?Patient Details ?Name: Perry Bullock ?MRN: [id number] ?DOB: 1961-05-07 ?Today's Date: 10/02/2021 ? ? ?History of Present Illness Pt is a 61 y.o. 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  MD assessment includes Thrombocytopenia, syncope with a fall, UTI, and L hip acetabular fracture to be managed conservatively. ? ?  ?PT Comments  ? ? Pt was pleasant and motivated to participate during the session and put forth good effort throughout. Pt required occasional minor cuing for sequencing and was able to transfer and ambulate with good WB compliance throughout.  Pt ambulated with hop-to pattern and was able to travel a max of 20 feet with two standing rest breaks with SpO2 and HR WNL on room air.  Pt will benefit from PT services in a SNF setting upon discharge to safely address deficits listed in patient problem list for decreased caregiver assistance and eventual return to PLOF. ? ?   ?Recommendations for follow up therapy are one component of a multi-disciplinary discharge planning process, led by the attending physician.  Recommendations may be updated based on patient status, additional functional criteria and insurance authorization. ? ?Follow Up Recommendations ? Skilled nursing-short term rehab (<3 hours/day) ?  ?  ?Assistance Recommended at Discharge Frequent or constant Supervision/Assistance  ?Patient can return home with the following A lot of help with walking and/or transfers;A lot of help with bathing/dressing/bathroom;Assistance with cooking/housework;Direct supervision/assist for medications management;Direct supervision/assist for financial management;Assist for transportation;Help with stairs or ramp for entrance ?  ?Equipment Recommendations ? None recommended by PT  ?  ?Recommendations for Other Services   ? ? ?  ?Precautions / Restrictions  Precautions ?Precautions: Fall ?Restrictions ?Weight Bearing Restrictions: Yes ?LLE Weight Bearing: Touchdown weight bearing  ?  ? ?Mobility ? Bed Mobility ?Overal bed mobility: Modified Independent ?  ?  ?  ?  ?  ?  ?General bed mobility comments: Min extra time and effort only ?  ? ?Transfers ?Overall transfer level: Needs assistance ?Equipment used: Rolling walker (2 wheels) ?Transfers: Sit to/from Stand ?Sit to Stand: Min guard, From elevated surface ?  ?  ?  ?  ?  ?General transfer comment: min verbal cues for sequencing for LLE WB compliance ?  ? ?Ambulation/Gait ?Ambulation/Gait assistance: Min guard ?Gait Distance (Feet): 20 Feet ?Assistive device: Rolling walker (2 wheels) ?  ?Gait velocity: decreased ?  ?  ?General Gait Details: Pt able to amb grossly 20 feet with hop-to gait pattern and two 5-10 standing rest breaks, WB status maintianed throughout ? ? ?Stairs ?  ?  ?  ?  ?  ? ? ?Wheelchair Mobility ?  ? ?Modified Rankin (Stroke Patients Only) ?  ? ? ?  ?Balance Overall balance assessment: Needs assistance, History of Falls ?Sitting-balance support: Single extremity supported ?Sitting balance-Leahy Scale: Normal ?  ?  ?Standing balance support: Bilateral upper extremity supported, During functional activity, Reliant on assistive device for balance ?Standing balance-Leahy Scale: Fair ?  ?  ?  ?  ?  ?  ?  ?  ?  ?  ?  ?  ?  ? ?  ?Cognition Arousal/Alertness: Awake/alert ?Behavior During Therapy: [facility] for tasks assessed/performed ?Overall Cognitive Status: Within Functional Limits for tasks assessed ?  ?  ?  ?  ?  ?  ?  ?  ?  ?  ?  ?  ?  ?  ?  ?  ?  ?  ?  ? ?  ?  Exercises Total Joint Exercises ?Ankle Circles/Pumps: AROM, Strengthening, Both, 10 reps (manual resistance on the RLE) ?Heel Slides: Strengthening, Right, 10 reps (with manual resistance) ?Hip ABduction/ADduction: Strengthening, Right, 10 reps (with manual resistance) ?Straight Leg Raises: Strengthening, Right, 10 reps ?Long Arc Quad: Strengthening,  Left, 10 reps (with manual resistance) ?Knee Flexion: Strengthening, Left, 10 reps (with manual resistance) ? ?  ?General Comments   ?  ?  ? ?Pertinent Vitals/Pain Pain Assessment ?Pain Assessment: 0-10 ?Pain Score: 8  ?Pain Location: L hip ?Pain Descriptors / Indicators: Sore ?Pain Intervention(s): Premedicated before session, Repositioned, Monitored during session  ? ? ?Home Living   ?  ?  ?  ?  ?  ?  ?  ?  ?  ?   ?  ?Prior Function    ?  ?  ?   ? ?PT Goals (current goals can now be found in the care plan section) Progress towards PT goals: Progressing toward goals ? ?  ?Frequency ? ? ? 7X/week ? ? ? ?  ?PT Plan Current plan remains appropriate  ? ? ?Co-evaluation   ?  ?  ?  ?  ? ?  ?AM-PAC PT "6 Clicks" Mobility   ?Outcome Measure ? Help needed turning from your back to your side while in a flat bed without using bedrails?: A Little ?Help needed moving from lying on your back to sitting on the side of a flat bed without using bedrails?: A Little ?Help needed moving to and from a bed to a chair (including a wheelchair)?: A Little ?Help needed standing up from a chair using your arms (e.g., wheelchair or bedside chair)?: A Little ?Help needed to walk in hospital room?: A Lot ?Help needed climbing 3-5 steps with a railing? : A Lot ?6 Click Score: 16 ? ?  ?End of Session Equipment Utilized During Treatment: Gait belt ?Activity Tolerance: Patient tolerated treatment well ?Patient left: in bed;with call bell/phone within reach;with bed alarm set;with family/visitor present ?Nurse Communication: Mobility status;Weight bearing status ?PT Visit Diagnosis: History of falling (Z91.81);Unsteadiness on feet (R26.81);Difficulty in walking, not elsewhere classified (R26.2);Muscle weakness (generalized) (M62.81);Pain ?Pain - Right/Left: Left ?Pain - part of body: Hip ?  ? ? ?Time: 5974-1638 ?PT Time Calculation (min) (ACUTE ONLY): 27 min ? ?Charges:  $Gait Training: 8-22 mins ?$Therapeutic Exercise: 8-22 mins          ?           ? ?D. Elly Modena PT, DPT ?10/02/21, 2:23 PM ? ? ?

## 2021-10-02 NOTE — Consult Note (Signed)
WOC Nurse Consult Note: ?Reason for Consult: skin tear ?Patient sustained fall outside of hospital prior to admission  ?Wound type: skin tear with skin flap loss ?Pressure Injury POA: NA ?Measurement:2cm x 3cm x 0.1cm  ?Wound bed: clean, pink ?Drainage (amount, consistency, odor) scant, serosanguinous  ?Periwound: large ecchymotic area around skin tear from fall; non bullous hematoma ?Dressing procedure/placement/frequency: ?Single layer of xeroform gauze over open area, top with foam. Change every other day ? ?Discussed POC with patient and bedside nurse.  ?Re consult if needed, will not follow at this time. ?Thanks ? [facility]ule[facility] MSN, RN,CWOCN, CNS, CWON-AP [phone])  ? ? ?  ?
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
## 2021-10-03 ENCOUNTER — Inpatient Hospital Stay (HOSPITAL_COMMUNITY)
Admit: 2021-10-03 | Discharge: 2021-10-03 | Disposition: A | Discharge status: Home / Self Care | Payer: Medicaid Other | Attending: Internal Medicine | Admitting: Internal Medicine

## 2021-10-03 DIAGNOSIS — R55 Syncope and collapse: Secondary | ICD-10-CM

## 2021-10-03 LAB — ECHOCARDIOGRAM COMPLETE
AR max vel: 2.31 cm2
AV Area VTI: 2.63 cm2
AV Area mean vel: 2.05 cm2
AV Mean grad: 5 mmHg
AV Peak grad: 7.7 mmHg
Ao pk vel: 1.39 m/s
Area-P 1/2: 4.41 cm2
Height: 70 in
MV VTI: 2.83 cm2
S' Lateral: 2.3 cm
Weight: 2422.4 oz

## 2021-10-03 LAB — URINE CULTURE: Culture: >=100000 — AB

## 2021-10-03 LAB — GLUCOSE, CAPILLARY: Glucose-Capillary: 94 mg/dL (ref 70–99)

## 2021-10-03 MED ORDER — CEPHALEXIN 500 MG PO CAPS
500.0000 mg | ORAL_CAPSULE | Freq: Three times a day (TID) | ORAL | Status: DC
  Administered 2021-10-03 – 2021-10-04 (×4): 500 mg via ORAL
  Filled 2021-10-03 (×4): qty 1

## 2021-10-03 MED ORDER — ENSURE ENLIVE PO LIQD
237.0000 mL | Freq: Two times a day (BID) | ORAL | Status: DC
  Administered 2021-10-03 – 2021-10-04 (×3): 237 mL via ORAL

## 2021-10-03 MED ORDER — OXYCODONE HCL 5 MG PO TABS
5.0000 mg | ORAL_TABLET | ORAL | Status: DC | PRN
  Administered 2021-10-03 – 2021-10-04 (×6): 5 mg via ORAL
  Filled 2021-10-03 (×6): qty 1

## 2021-10-03 NOTE — Assessment & Plan Note (Signed)
Differentials include TIA, CVA, dysrhythmia, alcohol and cocaine abuse. ?Patient also take multiple medications which can impair his balance as told by his partner.  Most likely polypharmacy was the cause of his fall. ?EKG with normal sinus rhythm and right bundle branch block.  ?Ethanol level and UDS negative for cocaine but positive for opioids and tricyclic. ?Echocardiogram with normal EF, grade 1 diastolic dysfunction and no other significant abnormality. ?PT/OT are recommending SNF. ?TOC is working on it ?

## 2021-10-03 NOTE — Progress Notes (Signed)
[id number] E ? ?PASRR Number received facility aware.  ? [place], [place] ?[phone] ? ?

## 2021-10-03 NOTE — Progress Notes (Signed)
Occupational Therapy Treatment ?Patient Details ?Name: Perry Bullock ?MRN: [id number] ?DOB: 1961/05/27 ?Today's Date: 10/03/2021 ? ? ?History of present illness Pt is a 61 y.o. 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  MD assessment includes Thrombocytopenia, syncope with a fall, UTI, and L hip acetabular fracture to be managed conservatively. ?  ?OT comments ? Perry Bullock was seen for OT treatment on this date. Upon arrival to room pt standing at sink, family in room, pt not maintaining LLE TTWBing pcns. Pt requires CGA + RW for ADL t/f, tolerates ~20 ft - cues to maintain TTWBing pcns. MIN A standing grooming tasks, pt unable to reach outside BOS while maintaining pcns, educated on completing in sitting for safety. Pt making progress toward goals. Will continue to follow POC. Discharge recommendation remains appropriate.  ?  ? ?Recommendations for follow up therapy are one component of a multi-disciplinary discharge planning process, led by the attending physician.  Recommendations may be updated based on patient status, additional functional criteria and insurance authorization. ?   ?Follow Up Recommendations ? Skilled nursing-short term rehab (<3 hours/day)  ?  ?Assistance Recommended at Discharge Frequent or constant Supervision/Assistance  ?Patient can return home with the following ? A little help with walking and/or transfers;A lot of help with bathing/dressing/bathroom;Assistance with cooking/housework;Assist for transportation;Help with stairs or ramp for entrance ?  ?Equipment Recommendations ? BSC/3in1  ?  ?Recommendations for Other Services   ? ?  ?Precautions / Restrictions Precautions ?Precautions: Fall ?Restrictions ?Weight Bearing Restrictions: Yes ?LLE Weight Bearing: Touchdown weight bearing  ? ? ?  ? ?Mobility Bed Mobility ?Overal bed mobility: Modified  Independent ?  ?  ?  ?  ?  ?  ?  ?  ? ?Transfers ?Overall transfer level: Needs assistance ?Equipment used: Rolling walker (2 wheels) ?Transfers: Sit to/from Stand ?Sit to Stand: Min guard ?  ?  ?  ?  ?  ?  ?  ?  ?Balance Overall balance assessment: Needs assistance, History of Falls ?Sitting-balance support: No upper extremity supported, Feet supported ?Sitting balance-Leahy Scale: Normal ?  ?  ?Standing balance support: Single extremity supported, During functional activity ?Standing balance-Leahy Scale: Fair ?  ?  ?  ?  ?  ?  ?  ?  ?  ?  ?  ?  ?   ? ?ADL either performed or assessed with clinical judgement  ? ?ADL Overall ADL's : Needs assistance/impaired ?  ?  ?  ?  ?  ?  ?  ?  ?  ?  ?  ?  ?  ?  ?  ?  ?  ?  ?  ?General ADL Comments: CGA + RW for ADL t/f - cues to maintain TTWBing pcns. MIN A standing grooming tasks, pt unable to reach outside BOS while maintaining pcns ?  ? ? ? ?Cognition Arousal/Alertness: Awake/alert ?Behavior During Therapy: [facility] for tasks assessed/performed ?Overall Cognitive Status: Within Functional Limits for tasks assessed ?  ?  ?  ?  ?  ?  ?  ?  ?  ?  ?  ?  ?  ?  ?  ?  ?  ?  ?  ?   ?   ?   ?General Comments HR 150 with mobility  ? ? ?Pertinent Vitals/ Pain       Pain Assessment ?Pain Assessment: 0-10 ?Pain Score: 7  ?  Pain Location: L hip ?Pain Descriptors / Indicators: Sore ?Pain Intervention(s): Limited activity within patient's tolerance, Repositioned ? ?   ?   ? ?Frequency ? Min 2X/week  ? ? ? ? ?  ?Progress Toward Goals ? ?OT Goals(current goals can now be found in the care plan section) ? Progress towards OT goals: Progressing toward goals ? ?Acute Rehab OT Goals ?Patient Stated Goal: to get better ?OT Goal Formulation: With patient ?Time For Goal Achievement: 10/15/21 ?Potential to Achieve Goals: Good ?ADL Goals ?Pt Will Perform Lower Body Dressing: with modified independence;sit to/from stand ?Pt Will Transfer to Toilet: with modified independence;ambulating ?Additional ADL Goal  #1: Pt will complete seated bathing with supervision for TTB transfer and remote supervision for safety, maintaining LLE TTWBing. ?Additional ADL Goal #2: Pt will verbalize plan to implement at least 2 learned falls prevention strategies to maximize safety/indep with ADL/mobility at home.  ?Plan Discharge plan remains appropriate;Frequency remains appropriate   ? ?Co-evaluation ? ? ?   ?  ?  ?  ?  ? ?  ?AM-PAC OT "6 Clicks" Daily Activity     ?Outcome Measure ? ? Help from another person eating meals?: None ?Help from another person taking care of personal grooming?: A Little ?Help from another person toileting, which includes using toliet, bedpan, or urinal?: A Little ?Help from another person bathing (including washing, rinsing, drying)?: A Lot ?Help from another person to put on and taking off regular upper body clothing?: A Little ?Help from another person to put on and taking off regular lower body clothing?: A Lot ?6 Click Score: 17 ? ?  ?End of Session Equipment Utilized During Treatment: Rolling walker (2 wheels) ? ?OT Visit Diagnosis: Muscle weakness (generalized) (M62.81);Pain;Repeated falls (R29.6) ?Pain - Right/Left: Left ?Pain - part of body: Hip ?  ?Activity Tolerance Patient tolerated treatment well ?  ?Patient Left in bed;with call bell/phone within reach (pt refused bed alarm, RN aware) ?  ?Nurse Communication Mobility status ?  ? ?   ? ?Time: 7989-2119 ?OT Time Calculation (min): 17 min ? ?Charges: OT General Charges ?$OT Visit: 1 Visit ?OT Treatments ?$Self Care/Home Management : 8-22 mins ? ?Kathie Dike, M.S. OTR/L  ?10/03/21, 1:18 PM  ?ascom [phone] ? ?

## 2021-10-03 NOTE — Assessment & Plan Note (Signed)
?    Latest Ref Rng & Units 10/01/2021  ?  4:18 AM 09/30/2021  ?  5:16 PM 03/14/2020  ?  3:57 AM  ?CBC  ?WBC 4.0 - 10.5 K/uL 6.6   7.3   10.2    ?Hemoglobin 13.0 - 17.0 g/dL 8.8   9.6   8.1    ?Hematocrit 39.0 - 52.0 % 26.7   29.2   24.9    ?Platelets 150 - 400 K/uL 121   74   215    ? ?Hemoglobin of 9.6>>8.8  ?-Continue to monitor ?-Anemia panel with mild iron deficiency. ?-Start him on supplement ?-Transfuse if below 7 ? ?

## 2021-10-03 NOTE — Progress Notes (Signed)
*  PRELIMINARY RESULTS* ?Echocardiogram ?2D Echocardiogram has been performed. ? ?Perry Bullock, Dorene Sorrow ?10/03/2021, 8:25 AM ?

## 2021-10-03 NOTE — Assessment & Plan Note (Signed)
UA concerning for UTI with large leukocytosis.  Urine cultures pending.  History of positive urine cultures Klebsiella pneumonia in September 2021, good sensitivity.  He was started on ceftriaxone. ?Per patient he does has some dysuria and increased urinary frequency ?Current urine cultures with E. coli, which shows resistance to ampicillin, ciprofloxacin and Bactrim. ?-Start him on Keflex instead of ceftriaxone ? ?

## 2021-10-03 NOTE — Progress Notes (Signed)
Physical Therapy Treatment ?Patient Details ?Name: Perry Bullock ?MRN: [id number] ?DOB: 30-Jan-1961 ?Today's Date: 10/03/2021 ? ? ?History of Present Illness Pt is a 61 y.o. 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  MD assessment includes Thrombocytopenia, syncope with a fall, UTI, and L hip acetabular fracture to be managed conservatively. ? ?  ?PT Comments  ? ? Patient is pleasant and cooperative during session. Continued with gait training with increased ambulation distance this session, however he continues to be limited with activity progression in standing due to fatigue and elevated heart rate with activity. He required cues for safety and techniques to maintain weight bearing restrictions during functional mobility and has difficulty with true TTWB with transfers. Recommend to continue PT to maximize independence and facilitate return to prior level of function. SNF recommended at discharge.  ?  ?Recommendations for follow up therapy are one component of a multi-disciplinary discharge planning process, led by the attending physician.  Recommendations may be updated based on patient status, additional functional criteria and insurance authorization. ? ?Follow Up Recommendations ? Skilled nursing-short term rehab (<3 hours/day) ?  ?  ?Assistance Recommended at Discharge Frequent or constant Supervision/Assistance  ?Patient can return home with the following A lot of help with walking and/or transfers;A lot of help with bathing/dressing/bathroom;Assistance with cooking/housework;Direct supervision/assist for medications management;Direct supervision/assist for financial management;Assist for transportation;Help with stairs or ramp for entrance ?  ?Equipment Recommendations ? None recommended by PT  ?  ?Recommendations for Other Services   ? ? ?  ?Precautions / Restrictions  Precautions ?Precautions: Fall ?Restrictions ?Weight Bearing Restrictions: Yes ?LLE Weight Bearing: Touchdown weight bearing  ?  ? ?Mobility ? Bed Mobility ?Overal bed mobility: Needs Assistance ?Bed Mobility: Sit to Supine, Supine to Sit ?  ?  ?Supine to sit: Supervision ?Sit to supine: Min assist ?  ?General bed mobility comments: assistance for LLE intermittently. cues for technique. increased time and effort required ?  ? ?Transfers ?Overall transfer level: Needs assistance ?Equipment used: Rolling walker (2 wheels) ?Transfers: Sit to/from Stand ?Sit to Stand: Min guard ?  ?  ?  ?  ?  ?General transfer comment: with verbal cues for LLE positioning with sitting to maintain weight bearing status. patient has difficulty with follow through of directions for positioning and is likely putting more than TTWB through LLE with transfers ?  ? ?Ambulation/Gait ?Ambulation/Gait assistance: Min guard ?Gait Distance (Feet): 30 Feet ?Assistive device: Rolling walker (2 wheels) ?  ?  ?  ?  ?General Gait Details: hop-to gait pattern with standing rest breaks (5-10 seconds). patient is able to maintain weight bearing status with ambuation. activity tolerance is limited by fatigue with heart rate up to 145bpm with activity. Sp02 92% or higher on room air. patient educated on activity cessation and importance of maintaining weight bearing status with functional mobility. ? ? ?Stairs ?  ?  ?  ?  ?  ? ? ?Wheelchair Mobility ?  ? ?Modified Rankin (Stroke Patients Only) ?  ? ? ?  ?Balance Overall balance assessment: Needs assistance ?Sitting-balance support: No upper extremity supported ?Sitting balance-Leahy Scale: Normal ?  ?  ?Standing balance support: Single extremity supported ?Standing balance-Leahy Scale: Fair ?Standing balance comment: relying on rolling walker for support in standing ?  ?  ?  ?  ?  ?  ?  ?  ?  ?  ?  ?  ? ?  ?  Cognition Arousal/Alertness: Awake/alert ?Behavior During Therapy: [facility] for tasks  assessed/performed ?Overall Cognitive Status: Within Functional Limits for tasks assessed ?  ?  ?  ?  ?  ?  ?  ?  ?  ?  ?  ?  ?  ?  ?  ?  ?  ?  ?  ? ?  ?Exercises General Exercises - Lower Extremity ?Ankle Circles/Pumps: AROM, Strengthening, 10 reps, Supine ?Quad Sets: AROM, Strengthening, Both, 10 reps, Supine ?Gluteal Sets: AROM, Strengthening, Both, 10 reps, Supine ? ?  ?General Comments General comments (skin integrity, edema, etc.): HR 150 with mobility ?  ?  ? ?Pertinent Vitals/Pain Pain Assessment ?Pain Assessment: 0-10 ?Pain Score:  (6-7) ?Pain Location: left hip ?Pain Descriptors / Indicators: Sore ?Pain Intervention(s): Limited activity within patient's tolerance, Ice applied  ? ? ?Home Living   ?  ?  ?  ?  ?  ?  ?  ?  ?  ?   ?  ?Prior Function    ?  ?  ?   ? ?PT Goals (current goals can now be found in the care plan section) Acute Rehab PT Goals ?Patient Stated Goal: To walk better ?PT Goal Formulation: With patient ?Time For Goal Achievement: 10/14/21 ?Potential to Achieve Goals: Good ?Progress towards PT goals: Progressing toward goals ? ?  ?Frequency ? ? ? 7X/week ? ? ? ?  ?PT Plan Current plan remains appropriate  ? ? ?Co-evaluation   ?  ?  ?  ?  ? ?  ?AM-PAC PT "6 Clicks" Mobility   ?Outcome Measure ? Help needed turning from your back to your side while in a flat bed without using bedrails?: A Little ?Help needed moving from lying on your back to sitting on the side of a flat bed without using bedrails?: A Little ?Help needed moving to and from a bed to a chair (including a wheelchair)?: A Little ?Help needed standing up from a chair using your arms (e.g., wheelchair or bedside chair)?: A Little ?Help needed to walk in hospital room?: A Lot ?Help needed climbing 3-5 steps with a railing? : A Lot ?6 Click Score: 16 ? ?  ?End of Session Equipment Utilized During Treatment: Gait belt ?Activity Tolerance: Patient tolerated treatment well ?Patient left: in bed;with call bell/phone within reach ?  ?PT  Visit Diagnosis: History of falling (Z91.81);Unsteadiness on feet (R26.81);Difficulty in walking, not elsewhere classified (R26.2);Muscle weakness (generalized) (M62.81);Pain ?Pain - Right/Left: Left ?Pain - part of body: Hip ?  ? ? ?Time: 4193-7902 ?PT Time Calculation (min) (ACUTE ONLY): 38 min ? ?Charges:  $Gait Training: 23-37 mins ?$Therapeutic Exercise: 8-22 mins          ?          ? ?Donna Bernard, PT, MPT ? ? ? ?Ina [employer] ?10/03/2021, 1:40 PM ? ?

## 2021-10-03 NOTE — Progress Notes (Signed)
RE:  Perry Bullock   ?Date of Birth:   2062/11/19__________  ?Date: 10/03/21     ? ? ?To Whom It May Concern: ? ?Please be advised that the above-named patient will require a short-term nursing home stay - anticipated 30 days or less for rehabilitation and strengthening.  The plan is for return home. ? ? ?[place], [place] ?[phone] ? ?

## 2021-10-03 NOTE — Progress Notes (Signed)
Initial Nutrition Assessment ? ?DOCUMENTATION CODES:  ? ?Not applicable ? ?INTERVENTION:  ? ?-Ensure Enlive po BID, each supplement provides 350 kcal and 20 grams of protein ?-MVI with minerals daily ? ?NUTRITION DIAGNOSIS:  ? ?Increased nutrient needs related to acute illness as evidenced by estimated needs. ? ?GOAL:  ? ?Patient will meet greater than or equal to 90% of their needs ? ?MONITOR:  ? ?PO intake, Supplement acceptance, Diet advancement, Labs, Weight trends, Skin, I & O's ? ?REASON FOR ASSESSMENT:  ? ?Malnutrition Screening Tool ?  ? ?ASSESSMENT:  ? ?Perry Bullock is a 61 y.o. male with medical history significant of hypertension drug abuse tobacco abuse presenting with a syncopal episode when he was walking. ? ?Pt admitted with syncopal episode.   ? ?Reviewed I/O's: -847 ml x 24 hours and -407 ml since admission ? ?Spoke with pt at bedside, who was pleasant and in good spirits today. He just finished working with therapy and was pleased with his progress. His appetite has improved greatly since admission; PO 100%. Pt reports eating all of his breakfast and also consumed 2 cartons of milk at time of visit.  ? ?Per pt, intake was poor for approximately 3 weeks PTA. He reports he has been weak and has suffered some depression, which caused him not to eat. Pt typically consumes 3 meals per day and he and his wife share cooking responsibilities.  ? ?Per pt, his UBW is 180#, but he has weighed that in 2017, when he suffered a major accident when he fell off the roof and fracture his hip and shoulder. His weight usually is around 140-150#. Per pt, he was 130# on admission, but has regained wt since bing in the hospital. Noted wt has been stable over the past 4 months.  ? ?Discussed importance of good meal and supplement intake to promote healing. Pt amenable to supplements.  ? ?Medications reviewed and include thiamine and ferrous fumarate-B-12-vitamin C-folic acid.  ? ?Labs reviewed: CBGS: 94-129 (inpatient  orders for glycemic control are none).   ? ?NUTRITION - FOCUSED PHYSICAL EXAM: ? ?Flowsheet Row Most Recent Value  ?Orbital Region No depletion  ?Upper Arm Region No depletion  ?Thoracic and Lumbar Region No depletion  ?Buccal Region No depletion  ?Temple Region Mild depletion  ?Clavicle Bone Region Mild depletion  ?Clavicle and Acromion Bone Region Mild depletion  ?Scapular Bone Region Mild depletion  ?Dorsal Hand No depletion  ?Patellar Region No depletion  ?Anterior Thigh Region No depletion  ?Edema (RD Assessment) Mild  ?Hair Reviewed  ?Eyes Reviewed  ?Mouth Reviewed  ?Skin Reviewed  ?Nails Reviewed  ? ?  ? ? ?Diet Order:   ?Diet Order   ? ?       ?  Diet Heart Room service appropriate? Yes; Fluid consistency: Thin  Diet effective now       ?  ? ?  ?  ? ?  ? ? ?EDUCATION NEEDS:  ? ?Education needs have been addressed ? ?Skin:  Skin Assessment: Skin Integrity Issues: ?Skin Integrity Issues:: Other (Comment) ?Other: skin tear rt upper forearm ? ?Last BM:  10/02/21 ? ?Height:  ? ?Ht Readings from Last 1 Encounters:  ?10/01/21 5\' 10"  (1.778 m)  ? ? ?Weight:  ? ?Wt Readings from Last 1 Encounters:  ?10/03/21 68.7 kg  ? ? ?Ideal Body Weight:  75.5 kg ? ?BMI:  Body mass index is 21.72 kg/m?. ? ?Estimated Nutritional Needs:  ? ?Kcal:  1850-2050 ? ?Protein:  90-105 grams ? ?  Fluid:  > 1.8 L ? ? ? ?Loistine Chance, RD, LDN, CDCES ?Registered Dietitian II ?Certified Diabetes Care and Education Specialist ?Please refer to [facility] for RD and/or RD on-call/weekend/after hours pager  ?

## 2021-10-03 NOTE — Progress Notes (Signed)
Assumed care of pt at 1900. A&O x4. On/off home O2 PRN. Reporting 8/10 pain at the throughout this shift r/t left hip fx. Medication administration per MAR. Ice applied. Full assessment per flowsheets. Call bell within reach, making needs known. Safety maintained overnight.  ?

## 2021-10-03 NOTE — Progress Notes (Signed)
?Progress Note ? ? ?Patient: Perry Bullock [id number] DOB: Sep 01, 1960 DOA: 09/30/2021     1 ?DOS: the patient was seen and examined on 10/03/2021 ?  ?Brief hospital course: ?Taken from H&P. ? ?Perry Bullock is a 61 y.o. male with medical history significant of hypertension drug abuse tobacco abuse presenting with a syncopal episode when he was walking. ?Patient reports that he passed out for a minute or 2 when he woke up and came to the hospital.  Patient currently lives with his partner who is also his caretaker for the past 30 years. ?Patient denies any dizziness but according to the partner he takes multiple sedating medications which include trazodone, oxycodone and Seroquel at night and becomes a little wobbly in the morning. ?She does not think that he lost consciousness. ? ?She was concerned about his progressive decline over the past many months, patient at time refused p.o. intake for days and then resume it.  He also has an history of bipolar disorder and follow-up with outpatient psychiatry. ? ?Patient has an history of alcohol and cocaine abuse.  His last alcohol intake was a day prior to the hospitalization, when he had 2 bottles of beer.  Ethanol levels were less than 10.  UDS was negative for cocaine but positive for opioids and tricyclic's. ? ?CT head was without any acute intracranial abnormality, shows advanced cerebral atrophy and chronic microvascular ischemic changes of the white matter. ? ?CT spine was negative for any acute fractures and shows advanced degenerative disc disease most prominent at C5-C6 and ?C6-6 7 levels with moderate spinal cord and bilateral neural foraminal stenosis. ? ?Imaging of left hip with concern of acute posterior column acetabular fracture with recommendation to proceed with CT left hip which was done and shows a posterior acetabular wall fracture with disruption of the ilioischial line consistent with acute posterior column fracture.  Orthopedic surgery was  consulted from ED and they recommended conservative management and proceed with physical therapy. ? ?There was also noted a healing intertrochanteric fracture with intact hardware. ? ?Labs pertinent for normal TSH and mildly elevated free T4. ?Hemoglobin of 9.6 and thrombocytopenia with platelet of 74, no prior diagnosis of liver cirrhosis.  Ordered right upper quadrant ultrasound, which showed hepatic steatosis and hepatomegaly, no cirrhosis. ? ?This morning patient was having significant pain involving left side of body, left hip and left arm.  Significant bruising of left elbow and humeral shaft.  Ordered shoulder and elbow imaging. ?Which are negative for any acute fracture. ? ?PT/OT recommending SNF-TOC is working on it. ? ?Echocardiogram with normal EF, grade 1 diastolic dysfunction and no other significant abnormality. ? ? ? ? ?Assessment and Plan: ?* Syncope and collapse ?Differentials include TIA, CVA, dysrhythmia, alcohol and cocaine abuse. ?Patient also take multiple medications which can impair his balance as told by his partner.  Most likely polypharmacy was the cause of his fall. ?EKG with normal sinus rhythm and right bundle branch block.  ?Ethanol level and UDS negative for cocaine but positive for opioids and tricyclic. ?Echocardiogram with normal EF, grade 1 diastolic dysfunction and no other significant abnormality. ?PT/OT are recommending SNF. ?TOC is working on it ? ?Acetabular fracture ([place]) ?Acetabular fracture Case has been discussed by ED provider to orthopedics on-call Dr. Harlow Mares states that this is conservative management and the fracture will heal on its own.  ?-Pending PT evaluation ?-Continue with pain management ? ?Left upper extremity swelling ?Patient with significant left upper extremity edema and bruising around  elbow and humeral shaft. ?-DG left shoulder and elbow was negative for any acute fractures. ?-Continue with pain management ? ?Cocaine abuse ([place]) ?Urine drug screen is  negative today. ? ? ?Alcohol abuse ?Patient states the last time he drank was yesterday where he had 2 bottles of beer. ?Magnesium as expected low end and was replaced. ?-Continue with CIWA protocol ?-Continue with thiamine ? ?Anemia ? ?  Latest Ref Rng & Units 10/01/2021  ?  4:18 AM 09/30/2021  ?  5:16 PM 03/14/2020  ?  3:57 AM  ?CBC  ?WBC 4.0 - 10.5 K/uL 6.6   7.3   10.2    ?Hemoglobin 13.0 - 17.0 g/dL 8.8   9.6   8.1    ?Hematocrit 39.0 - 52.0 % 26.7   29.2   24.9    ?Platelets 150 - 400 K/uL 121   74   215    ? ?Hemoglobin of 9.6>>8.8  ?-Continue to monitor ?-Anemia panel with mild iron deficiency. ?-Start him on supplement ?-Transfuse if below 7 ? ? ?Tobacco abuse ?Nicotine patch.  ? ?Thrombocytopenia ([place]) ?Platelet counts of 74,000>>121000 ?Right upper quadrant ultrasound shows hepatic steatosis and hepatomegaly, no cirrhosis. ?-Continue to monitor ? ?COPD (chronic obstructive pulmonary disease) ([place]) ?No wheezing or shortness of breath. ?-Continue home inhalers ? ?Hypomagnesemia ?Resolved. ?Replace magnesium as needed ? ?Bipolar disorder ([place]) ?- Continue home Seroquel ? ?UTI (urinary tract infection) ?UA concerning for UTI with large leukocytosis.  Urine cultures pending.  History of positive urine cultures Klebsiella pneumonia in September 2021, good sensitivity.  He was started on ceftriaxone. ?Per patient he does has some dysuria and increased urinary frequency ?Current urine cultures with E. coli, which shows resistance to ampicillin, ciprofloxacin and Bactrim. ?-Start him on Keflex instead of ceftriaxone ? ?  ? ?Subjective: Patient was seen and examined today.  No new complaints.  Wife at bedside.  Per wife patient does not take Percocet and she was requesting going back to home oxycodone which was ordered appropriately.  Percocet was discontinued. ? ?Physical Exam: ?Vitals:  ? 10/03/21 0359 10/03/21 0433 10/03/21 0721 10/03/21 1135  ?BP: 105/86  119/77 117/72  ?Pulse: 99  96 (!) 105  ?Resp: 18  19 20    ?Temp: 97.9 ?F (36.6 ?C)  97.9 ?F (36.6 ?C) 98 ?F (36.7 ?C)  ?TempSrc:      ?SpO2: 98%  97% 97%  ?Weight:  68.7 kg    ?Height:      ? ?General.     In no acute distress. ?Pulmonary.  Lungs clear bilaterally, normal respiratory effort. ?CV.  Regular rate and rhythm, no JVD, rub or murmur. ?Abdomen.  Soft, nontender, nondistended, BS positive. ?CNS.  Alert and oriented .  No focal neurologic deficit. ?Extremities.  No edema, no cyanosis, pulses intact and symmetrical. ?Psychiatry.  Judgment and insight appears normal. ? ?Data Reviewed: ?Prior notes and labs reviewed. ? ?Family Communication: Discussed with wife at bedside ? ?Disposition: ?Status is: Inpatient ?Remains inpatient appropriate because: Waiting for SNF placement ? ? Planned Discharge Destination: Skilled nursing facility ? ?DVT prophylaxis.  Lovenox ? ?Time spent: 40 minutes ? ?This record has been created using Systems analyst. Errors have been sought and corrected,but may not always be located. Such creation errors do not reflect on the standard of care. ? ?Author: ?Lorella Nimrod, MD ?10/03/2021 1:48 PM ? ?For on call review [email or web address].  ?

## 2021-10-03 NOTE — TOC Progression Note (Signed)
Transition of Care (TOC) - Progression Note  ? ? ?Patient Details  ?Name: Keiland Pickering Trinka ?MRN: [id number] ?Date of Birth: 10/29/1960 ? ?Transition of Care (TOC) CM/SW Contact  ?Gildardo Griffes, LCSW ?Phone Number: ?10/03/2021, 1:45 PM ? ?Clinical Narrative:    ? ?CSW spoke with patient's spouse [name] regarding bed offer from [facility] (now [facility]  ? ?She reports she accepts bed offer at [facility].  ? ?Patient will dc to [facility] tomorrow 4/7 per facility request as patient's PASRR is still pending at this time.  ? ?All requested documents sent to [place] must.  ? ?Expected Discharge Plan: Skilled Nursing Facility ?Barriers to Discharge: Continued Medical Work up ? ?Expected Discharge Plan and Services ?Expected Discharge Plan: Skilled Nursing Facility ?  ?  ?  ?  ?                ?  ?  ?  ?  ?  ?  ?  ?  ?  ?  ? ? ?Social Determinants of Health (SDOH) Interventions ?  ? ?Readmission Risk Interventions ?   ? View : No data to display.  ?  ?  ?  ? ? ?

## 2021-10-04 DIAGNOSIS — R55 Syncope and collapse: Secondary | ICD-10-CM | POA: Diagnosis not present

## 2021-10-04 LAB — BASIC METABOLIC PANEL
Anion gap: 7 (ref 5–15)
BUN: 29 mg/dL — ABNORMAL HIGH (ref 6–20)
CO2: 25 mmol/L (ref 22–32)
Calcium: 9 mg/dL (ref 8.9–10.3)
Chloride: 101 mmol/L (ref 98–111)
Creatinine, Ser: 0.73 mg/dL (ref 0.61–1.24)
GFR, Estimated: >60 mL/min (ref >60)
Glucose, Bld: 110 mg/dL — ABNORMAL HIGH (ref 70–99)
Potassium: 4.9 mmol/L (ref 3.5–5.1)
Sodium: 133 mmol/L — ABNORMAL LOW (ref 135–145)

## 2021-10-04 LAB — CBC
HCT: 24.6 % — ABNORMAL LOW (ref 39.0–52.0)
Hemoglobin: 8.1 g/dL — ABNORMAL LOW (ref 13.0–17.0)
MCH: 33.8 pg (ref 26.0–34.0)
MCHC: 32.9 g/dL (ref 30.0–36.0)
MCV: 102.5 fL — ABNORMAL HIGH (ref 80.0–100.0)
Platelets: 255 10*3/uL (ref 150–400)
RBC: 2.4 MIL/uL — ABNORMAL LOW (ref 4.22–5.81)
RDW: 14 % (ref 11.5–15.5)
WBC: 8.3 10*3/uL (ref 4.0–10.5)
nRBC: 0 % (ref 0.0–0.2)

## 2021-10-04 LAB — GLUCOSE, CAPILLARY: Glucose-Capillary: 110 mg/dL — ABNORMAL HIGH (ref 70–99)

## 2021-10-04 MED ORDER — ORAL CARE MOUTH RINSE
15.0000 mL | Freq: Two times a day (BID) | OROMUCOSAL | Status: DC
  Administered 2021-10-04: 15 mL via OROMUCOSAL

## 2021-10-04 MED ORDER — ENSURE ENLIVE PO LIQD
237.0000 mL | Freq: Two times a day (BID) | ORAL | 12 refills | Status: DC
Start: 2021-10-04 — End: 2023-09-23

## 2021-10-04 MED ORDER — FE FUMARATE-B12-VIT C-FA-IFC PO CAPS: 1.0000 | ORAL_CAPSULE | Freq: Two times a day (BID) | ORAL | Status: DC

## 2021-10-04 MED ORDER — CEPHALEXIN 500 MG PO CAPS: 500.0000 mg | ORAL_CAPSULE | Freq: Three times a day (TID) | ORAL | 0 refills | Status: DC

## 2021-10-04 MED ORDER — OXYCODONE HCL 5 MG PO TABS: 5.0000 mg | ORAL_TABLET | ORAL | 0 refills | Status: DC | PRN

## 2021-10-04 MED ORDER — BISACODYL 5 MG PO TBEC: 5.0000 mg | DELAYED_RELEASE_TABLET | Freq: Every day | ORAL | 0 refills | Status: DC | PRN

## 2021-10-04 NOTE — TOC Transition Note (Addendum)
Transition of Care (TOC) - CM/SW Discharge Note ? ? ?Patient Details  ?Name: Perry Bullock ?MRN: [id number] ?Date of Birth: [birth year] ? ?Transition of Care (TOC) CM/SW Contact:  ?Azad Calame A Jamall Strohmeier, LCSW ?Phone Number: ?10/04/2021, 1:36 PM ? ? ?Clinical Narrative:   [facility] unable to transport pt. [facility] will deliver a transport tank to pt's room. [facility] will be picking pt up in a hour-hour and a half. RN number and nurses station number provided to [employer]. RN aware to give pain med prior to dc. Facility aware.  ? ? ?Final next level of care: [place] ?Barriers to Discharge: No Barriers Identified ? ? ?Patient Goals and CMS Choice ?Patient states their goals for this hospitalization and ongoing recovery are:: to go home ?CMS Medicare.gov Compare Post Acute Care list provided to:: Patient ?Choice offered to / list presented to : Patient ? ?Discharge Placement ?  ?           ?Patient chooses bed at:  ([facility] ?Patient to be transferred to facility by: [facility] ?  ?Patient and family notified of of transfer: 10/04/21 ? ?Discharge Plan and Services ?  ?  ?           ?  ?  ?  ?  ?  ?  ?  ?  ?  ?  ? ?Social Determinants of Health (SDOH) Interventions ?  ? ? ?Readmission Risk Interventions ?   ? View : No data to display.  ?  ?  ?  ? ? ? ? ? ?

## 2021-10-04 NOTE — Progress Notes (Signed)
Patient alert and oriented, refused bed alarms, patient educated about safety, will continue to monitor. ?

## 2021-10-04 NOTE — TOC Transition Note (Signed)
Transition of Care (TOC) - CM/SW Discharge Note ? ? ?Patient Details  ?Name: Perry Bullock ?MRN: [id number] ?Date of Birth: 12/12/1960 ? ?Transition of Care (TOC) CM/SW Contact:  ?Perry Minotti A Novis League, LCSW ?Phone Number: ?10/04/2021, 11:20 AM ? ? ?Clinical Narrative:   Clinical Social Worker facilitated patient discharge including contacting patient family and facility to confirm patient discharge plans.  Clinical information faxed to facility and family agreeable with plan.  CSW arranged ambulance transport via [facility] to [facility] room 112B.  RN to call [phone] for report prior to discharge. ? ? ? ? ? ?Final next level of care: Skilled Nursing Facility ?Barriers to Discharge: No Barriers Identified ? ? ?Patient Goals and CMS Choice ?Patient states their goals for this hospitalization and ongoing recovery are:: to go home ?CMS Medicare.gov Compare Post Acute Care list provided to:: Patient ?Choice offered to / list presented to : Patient ? ?Discharge Placement ?  ?           ?Patient chooses bed at:  ([facility] ?Patient to be transferred to facility by: [facility] ?  ?Patient and family notified of of transfer: 10/04/21 ? ?Discharge Plan and Services ?  ?  ?           ?  ?  ?  ?  ?  ?  ?  ?  ?  ?  ? ?Social Determinants of Health (SDOH) Interventions ?  ? ? ?Readmission Risk Interventions ?   ? View : No data to display.  ?  ?  ?  ? ? ? ? ? ?

## 2021-10-04 NOTE — Discharge Summary (Signed)
?Physician Discharge Summary ?  ?Patient: Perry Bullock MRN: [id number] DOB: 12-05-1960  ?Admit date:     09/30/2021  ?Discharge date: 10/04/21  ?Discharge Physician: Lorella Nimrod  ? ?PCP: [facility] [employer]  ? ?Recommendations at discharge:  ?Please obtain CBC and BMP in 1 week ?Follow-up with orthopedic surgery in 1 to 2 weeks ?Follow-up with primary care provider in 1 week ?Patient was given antibiotics for UTI, please ensure that he completed the course. ?Patient uses 3 L of oxygen all the time at baseline and he will continue with that. ?Patient is also on chronic opioid therapy which is being managed by pain clinic, he will continue home dose of oxycodone. ? ?Discharge Diagnoses: ?Principal Problem: ?  Syncope and collapse ?Active Problems: ?  Acetabular fracture ([place]) ?  Left upper extremity swelling ?  Alcohol abuse ?  Cocaine abuse ([place]) ?  Tobacco abuse ?  Anemia ?  Thrombocytopenia ([place]) ?  COPD (chronic obstructive pulmonary disease) ([place]) ?  Hypomagnesemia ?  Bipolar disorder ([place]) ?  UTI (urinary tract infection) ?  Syncope ? ?Resolved Problems: ?  Acute blood loss anemia ? ?Hospital Course: ?Taken from H&P. ? ?Perry Bullock is a 61 y.o. male with medical history significant of hypertension drug abuse tobacco abuse presenting with a syncopal episode when he was walking. ?Patient reports that he passed out for a minute or 2 when he woke up and came to the hospital.  Patient currently lives with his partner who is also his caretaker for the past 30 years. ?Patient denies any dizziness but according to the partner he takes multiple sedating medications which include trazodone, oxycodone and Seroquel at night and becomes a little wobbly in the morning. ?She does not think that he lost consciousness. ? ?She was concerned about his progressive decline over the past many months, patient at time refused p.o. intake for days and then resume it.  He also has an history of bipolar disorder and  follow-up with outpatient psychiatry. ? ?Patient has an history of alcohol and cocaine abuse.  His last alcohol intake was a day prior to the hospitalization, when he had 2 bottles of beer.  Ethanol levels were less than 10.  UDS was negative for cocaine but positive for opioids and tricyclic's. ? ?CT head was without any acute intracranial abnormality, shows advanced cerebral atrophy and chronic microvascular ischemic changes of the white matter. ? ?CT spine was negative for any acute fractures and shows advanced degenerative disc disease most prominent at C5-C6 and ?C6-6 7 levels with moderate spinal cord and bilateral neural foraminal stenosis. ? ?Imaging of left hip with concern of acute posterior column acetabular fracture with recommendation to proceed with CT left hip which was done and shows a posterior acetabular wall fracture with disruption of the ilioischial line consistent with acute posterior column fracture.  Orthopedic surgery was consulted from ED and they recommended conservative management and proceed with physical therapy. ? ?There was also noted a healing intertrochanteric fracture with intact hardware. ? ?Labs pertinent for normal TSH and mildly elevated free T4. ?Hemoglobin of 9.6 and thrombocytopenia with platelet of 74, no prior diagnosis of liver cirrhosis.  Ordered right upper quadrant ultrasound, which showed hepatic steatosis and hepatomegaly, no cirrhosis. ? ?This morning patient was having significant pain involving left side of body, left hip and left arm.  Significant bruising of left elbow and humeral shaft.  Ordered shoulder and elbow imaging. ?Which are negative for any acute fracture. ? ?  PT/OT recommending SNF-TOC is working on it. ? ?Echocardiogram with normal EF, grade 1 diastolic dysfunction and no other significant abnormality. ? ?Urine culture with E. coli which shows resistance to ampicillin, Cipro floxacillin and Bactrim.  He received ceftriaxone which was transitioned to  Keflex to complete a 5-day course. ? ?Patient also has macrocytic anemia, anemia panel with concern of anemia of chronic disease and mild iron deficiency.  He was started on supplement.  Alcohol abuse is a contributory factor. ?For concern of liver cirrhosis we obtained right upper quadrant ultrasound which shows hepatic steatosis and hepatomegaly.  No definitive contorted nodularity to confirm cirrhosis.  But patient will remain high risk and need continuous counseling for alcohol cessation. ? ?Medically stable for discharge to rehab. ? ?He will continue current medications and need to have a close follow-up with his primary care provider and orthopedic surgery. ? ? ?Assessment and Plan: ?* Syncope and collapse ?Differentials include TIA, CVA, dysrhythmia, alcohol and cocaine abuse. ?Patient also take multiple medications which can impair his balance as told by his partner.  Most likely polypharmacy was the cause of his fall. ?EKG with normal sinus rhythm and right bundle branch block.  ?Ethanol level and UDS negative for cocaine but positive for opioids and tricyclic. ?Echocardiogram with normal EF, grade 1 diastolic dysfunction and no other significant abnormality. ?PT/OT are recommending SNF. ?TOC is working on it ? ?Acetabular fracture ([place]) ?Acetabular fracture Case has been discussed by ED provider to orthopedics on-call Dr. Harlow Mares states that this is conservative management and the fracture will heal on its own.  ?-Pending PT evaluation ?-Continue with pain management ? ?Left upper extremity swelling ?Patient with significant left upper extremity edema and bruising around elbow and humeral shaft. ?-DG left shoulder and elbow was negative for any acute fractures. ?-Continue with pain management ? ?Cocaine abuse ([place]) ?Urine drug screen is negative today. ? ? ?Alcohol abuse ?Patient states the last time he drank was yesterday where he had 2 bottles of beer. ?Magnesium as expected low end and was  replaced. ?-Continue with CIWA protocol ?-Continue with thiamine ? ?Anemia ? ?  Latest Ref Rng & Units 10/01/2021  ?  4:18 AM 09/30/2021  ?  5:16 PM 03/14/2020  ?  3:57 AM  ?CBC  ?WBC 4.0 - 10.5 K/uL 6.6   7.3   10.2    ?Hemoglobin 13.0 - 17.0 g/dL 8.8   9.6   8.1    ?Hematocrit 39.0 - 52.0 % 26.7   29.2   24.9    ?Platelets 150 - 400 K/uL 121   74   215    ? ?Hemoglobin of 9.6>>8.8  ?-Continue to monitor ?-Anemia panel with mild iron deficiency. ?-Start him on supplement ?-Transfuse if below 7 ? ? ?Tobacco abuse ?Nicotine patch.  ? ?Thrombocytopenia ([place]) ?Platelet counts of 74,000>>121000 ?Right upper quadrant ultrasound shows hepatic steatosis and hepatomegaly, no cirrhosis. ?-Continue to monitor ? ?COPD (chronic obstructive pulmonary disease) ([place]) ?No wheezing or shortness of breath. ?-Continue home inhalers ? ?Hypomagnesemia ?Resolved. ?Replace magnesium as needed ? ?Bipolar disorder ([place]) ?- Continue home Seroquel ? ?UTI (urinary tract infection) ?UA concerning for UTI with large leukocytosis.  Urine cultures pending.  History of positive urine cultures Klebsiella pneumonia in September 2021, good sensitivity.  He was started on ceftriaxone. ?Per patient he does has some dysuria and increased urinary frequency ?Current urine cultures with E. coli, which shows resistance to ampicillin, ciprofloxacin and Bactrim. ?-Start him on Keflex instead of ceftriaxone ? ? ?  ? ?  Pain control - [employer] Controlled Substance Reporting System database was reviewed. and patient was instructed, not to drive, operate heavy machinery, perform activities at heights, swimming or participation in water activities or provide baby-sitting services while on Pain, Sleep and Anxiety Medications; until their outpatient Physician has advised to do so again. Also recommended to not to take more than prescribed Pain, Sleep and Anxiety Medications.  ? ?Consultants: Orthopedic surgery ?Procedures performed: None ?Disposition: Skilled nursing  facility ?Diet recommendation:  ?Cardiac diet ?DISCHARGE MEDICATION: ?Allergies as of 10/04/2021   ? ?   Reactions  ? Ace Inhibitors Swelling  ? Angioedema 10/16 requiring intubation @ OSH 2/2 pt reportedly taking someone

## 2021-10-06 ENCOUNTER — Inpatient Hospital Stay (HOSPITAL_COMMUNITY)
Admission: EM | Admit: 2021-10-06 | Discharge: 2021-10-11 | DRG: 388 | Disposition: A | Discharge status: Home Health | Payer: Medicaid Other | Attending: Internal Medicine | Admitting: Internal Medicine

## 2021-10-06 ENCOUNTER — Other Ambulatory Visit: Payer: Self-pay

## 2021-10-06 ENCOUNTER — Emergency Department (HOSPITAL_COMMUNITY): Payer: Medicaid Other

## 2021-10-06 DIAGNOSIS — D62 Acute posthemorrhagic anemia: Secondary | ICD-10-CM | POA: Diagnosis present

## 2021-10-06 DIAGNOSIS — K2961 Other gastritis with bleeding: Secondary | ICD-10-CM | POA: Diagnosis not present

## 2021-10-06 DIAGNOSIS — E871 Hypo-osmolality and hyponatremia: Secondary | ICD-10-CM | POA: Diagnosis present

## 2021-10-06 DIAGNOSIS — S32422A Displaced fracture of posterior wall of left acetabulum, initial encounter for closed fracture: Secondary | ICD-10-CM | POA: Diagnosis present

## 2021-10-06 DIAGNOSIS — K56609 Unspecified intestinal obstruction, unspecified as to partial versus complete obstruction: Secondary | ICD-10-CM | POA: Diagnosis present

## 2021-10-06 DIAGNOSIS — K567 Ileus, unspecified: Secondary | ICD-10-CM | POA: Diagnosis present

## 2021-10-06 DIAGNOSIS — Z20822 Contact with and (suspected) exposure to covid-19: Secondary | ICD-10-CM | POA: Diagnosis present

## 2021-10-06 DIAGNOSIS — D696 Thrombocytopenia, unspecified: Secondary | ICD-10-CM | POA: Diagnosis present

## 2021-10-06 DIAGNOSIS — N390 Urinary tract infection, site not specified: Secondary | ICD-10-CM

## 2021-10-06 DIAGNOSIS — K760 Fatty (change of) liver, not elsewhere classified: Secondary | ICD-10-CM | POA: Diagnosis present

## 2021-10-06 DIAGNOSIS — D539 Nutritional anemia, unspecified: Secondary | ICD-10-CM

## 2021-10-06 DIAGNOSIS — G40909 Epilepsy, unspecified, not intractable, without status epilepticus: Secondary | ICD-10-CM

## 2021-10-06 DIAGNOSIS — G8929 Other chronic pain: Secondary | ICD-10-CM | POA: Diagnosis present

## 2021-10-06 DIAGNOSIS — S32409A Unspecified fracture of unspecified acetabulum, initial encounter for closed fracture: Secondary | ICD-10-CM | POA: Diagnosis present

## 2021-10-06 DIAGNOSIS — K5750 Diverticulosis of both small and large intestine without perforation or abscess without bleeding: Secondary | ICD-10-CM | POA: Diagnosis present

## 2021-10-06 DIAGNOSIS — B9620 Unspecified Escherichia coli [E. coli] as the cause of diseases classified elsewhere: Secondary | ICD-10-CM | POA: Diagnosis present

## 2021-10-06 DIAGNOSIS — E8770 Fluid overload, unspecified: Secondary | ICD-10-CM | POA: Diagnosis present

## 2021-10-06 DIAGNOSIS — Z888 Allergy status to other drugs, medicaments and biological substances status: Secondary | ICD-10-CM

## 2021-10-06 DIAGNOSIS — M549 Dorsalgia, unspecified: Secondary | ICD-10-CM | POA: Diagnosis present

## 2021-10-06 DIAGNOSIS — S32442A Displaced fracture of posterior column [ilioischial] of left acetabulum, initial encounter for closed fracture: Secondary | ICD-10-CM | POA: Diagnosis present

## 2021-10-06 DIAGNOSIS — K2960 Other gastritis without bleeding: Secondary | ICD-10-CM | POA: Diagnosis not present

## 2021-10-06 DIAGNOSIS — H409 Unspecified glaucoma: Secondary | ICD-10-CM | POA: Diagnosis present

## 2021-10-06 DIAGNOSIS — F319 Bipolar disorder, unspecified: Secondary | ICD-10-CM | POA: Diagnosis present

## 2021-10-06 DIAGNOSIS — A4150 Gram-negative sepsis, unspecified: Secondary | ICD-10-CM | POA: Diagnosis not present

## 2021-10-06 DIAGNOSIS — K920 Hematemesis: Secondary | ICD-10-CM | POA: Diagnosis not present

## 2021-10-06 DIAGNOSIS — Z8249 Family history of ischemic heart disease and other diseases of the circulatory system: Secondary | ICD-10-CM

## 2021-10-06 DIAGNOSIS — W19XXXA Unspecified fall, initial encounter: Secondary | ICD-10-CM | POA: Diagnosis present

## 2021-10-06 DIAGNOSIS — Z1611 Resistance to penicillins: Secondary | ICD-10-CM | POA: Diagnosis present

## 2021-10-06 DIAGNOSIS — N5089 Other specified disorders of the male genital organs: Secondary | ICD-10-CM | POA: Diagnosis present

## 2021-10-06 DIAGNOSIS — M5136 Other intervertebral disc degeneration, lumbar region: Secondary | ICD-10-CM | POA: Diagnosis present

## 2021-10-06 DIAGNOSIS — K922 Gastrointestinal hemorrhage, unspecified: Secondary | ICD-10-CM

## 2021-10-06 DIAGNOSIS — J449 Chronic obstructive pulmonary disease, unspecified: Secondary | ICD-10-CM | POA: Diagnosis present

## 2021-10-06 DIAGNOSIS — H5461 Unqualified visual loss, right eye, normal vision left eye: Secondary | ICD-10-CM | POA: Diagnosis present

## 2021-10-06 DIAGNOSIS — S32401A Unspecified fracture of right acetabulum, initial encounter for closed fracture: Secondary | ICD-10-CM | POA: Diagnosis not present

## 2021-10-06 DIAGNOSIS — K209 Esophagitis, unspecified without bleeding: Secondary | ICD-10-CM | POA: Diagnosis not present

## 2021-10-06 DIAGNOSIS — A419 Sepsis, unspecified organism: Secondary | ICD-10-CM | POA: Diagnosis not present

## 2021-10-06 DIAGNOSIS — K921 Melena: Secondary | ICD-10-CM | POA: Diagnosis present

## 2021-10-06 DIAGNOSIS — F17210 Nicotine dependence, cigarettes, uncomplicated: Secondary | ICD-10-CM | POA: Diagnosis present

## 2021-10-06 DIAGNOSIS — K56600 Partial intestinal obstruction, unspecified as to cause: Secondary | ICD-10-CM | POA: Diagnosis present

## 2021-10-06 DIAGNOSIS — R600 Localized edema: Secondary | ICD-10-CM | POA: Diagnosis present

## 2021-10-06 DIAGNOSIS — K5711 Diverticulosis of small intestine without perforation or abscess with bleeding: Secondary | ICD-10-CM | POA: Diagnosis not present

## 2021-10-06 DIAGNOSIS — K2100 Gastro-esophageal reflux disease with esophagitis, without bleeding: Secondary | ICD-10-CM | POA: Diagnosis not present

## 2021-10-06 DIAGNOSIS — K2101 Gastro-esophageal reflux disease with esophagitis, with bleeding: Secondary | ICD-10-CM | POA: Diagnosis not present

## 2021-10-06 DIAGNOSIS — I700 Atherosclerosis of aorta: Secondary | ICD-10-CM | POA: Diagnosis present

## 2021-10-06 DIAGNOSIS — K449 Diaphragmatic hernia without obstruction or gangrene: Secondary | ICD-10-CM | POA: Diagnosis present

## 2021-10-06 DIAGNOSIS — E876 Hypokalemia: Secondary | ICD-10-CM | POA: Diagnosis not present

## 2021-10-06 DIAGNOSIS — R6520 Severe sepsis without septic shock: Secondary | ICD-10-CM | POA: Diagnosis not present

## 2021-10-06 DIAGNOSIS — E878 Other disorders of electrolyte and fluid balance, not elsewhere classified: Secondary | ICD-10-CM | POA: Diagnosis present

## 2021-10-06 DIAGNOSIS — Z79899 Other long term (current) drug therapy: Secondary | ICD-10-CM

## 2021-10-06 DIAGNOSIS — F419 Anxiety disorder, unspecified: Secondary | ICD-10-CM | POA: Diagnosis present

## 2021-10-06 DIAGNOSIS — K219 Gastro-esophageal reflux disease without esophagitis: Secondary | ICD-10-CM | POA: Diagnosis present

## 2021-10-06 DIAGNOSIS — S42212A Unspecified displaced fracture of surgical neck of left humerus, initial encounter for closed fracture: Secondary | ICD-10-CM

## 2021-10-06 DIAGNOSIS — Z96612 Presence of left artificial shoulder joint: Secondary | ICD-10-CM | POA: Diagnosis present

## 2021-10-06 DIAGNOSIS — J302 Other seasonal allergic rhinitis: Secondary | ICD-10-CM | POA: Diagnosis present

## 2021-10-06 LAB — COMPREHENSIVE METABOLIC PANEL
ALT: 34 U/L (ref 0–44)
AST: 39 U/L (ref 15–41)
Albumin: 3.4 g/dL — ABNORMAL LOW (ref 3.5–5.0)
Alkaline Phosphatase: 135 U/L — ABNORMAL HIGH (ref 38–126)
Anion gap: 15 (ref 5–15)
BUN: 31 mg/dL — ABNORMAL HIGH (ref 6–20)
CO2: 28 mmol/L (ref 22–32)
Calcium: 8.9 mg/dL (ref 8.9–10.3)
Chloride: 91 mmol/L — ABNORMAL LOW (ref 98–111)
Creatinine, Ser: 1.14 mg/dL (ref 0.61–1.24)
GFR, Estimated: >60 mL/min (ref >60)
Glucose, Bld: 135 mg/dL — ABNORMAL HIGH (ref 70–99)
Potassium: 5 mmol/L (ref 3.5–5.1)
Sodium: 134 mmol/L — ABNORMAL LOW (ref 135–145)
Total Bilirubin: 0.7 mg/dL (ref 0.3–1.2)
Total Protein: 6.4 g/dL — ABNORMAL LOW (ref 6.5–8.1)

## 2021-10-06 LAB — CBC WITH DIFFERENTIAL/PLATELET
Abs Immature Granulocytes: 0.04 10*3/uL (ref 0.00–0.07)
Basophils Absolute: 0 10*3/uL (ref 0.0–0.1)
Basophils Relative: 0 %
Eosinophils Absolute: 0 10*3/uL (ref 0.0–0.5)
Eosinophils Relative: 0 %
HCT: 29.2 % — ABNORMAL LOW (ref 39.0–52.0)
Hemoglobin: 9.5 g/dL — ABNORMAL LOW (ref 13.0–17.0)
Immature Granulocytes: 0 %
Lymphocytes Relative: 7 %
Lymphs Abs: 0.7 10*3/uL (ref 0.7–4.0)
MCH: 34.2 pg — ABNORMAL HIGH (ref 26.0–34.0)
MCHC: 32.5 g/dL (ref 30.0–36.0)
MCV: 105 fL — ABNORMAL HIGH (ref 80.0–100.0)
Monocytes Absolute: 1.9 10*3/uL — ABNORMAL HIGH (ref 0.1–1.0)
Monocytes Relative: 18 %
Neutro Abs: 7.5 10*3/uL (ref 1.7–7.7)
Neutrophils Relative %: 75 %
Platelets: 427 10*3/uL — ABNORMAL HIGH (ref 150–400)
RBC: 2.78 MIL/uL — ABNORMAL LOW (ref 4.22–5.81)
RDW: 14.3 % (ref 11.5–15.5)
WBC: 10.2 10*3/uL (ref 4.0–10.5)
nRBC: 0 % (ref 0.0–0.2)

## 2021-10-06 LAB — RESP PANEL BY RT-PCR (FLU A&B, COVID) ARPGX2
Influenza A by PCR: NEGATIVE
Influenza B by PCR: NEGATIVE
SARS Coronavirus 2 by RT PCR: NEGATIVE

## 2021-10-06 LAB — APTT: aPTT: 25 seconds (ref 24–36)

## 2021-10-06 LAB — URINALYSIS, ROUTINE W REFLEX MICROSCOPIC
Bilirubin Urine: NEGATIVE
Glucose, UA: NEGATIVE mg/dL
Hgb urine dipstick: NEGATIVE
Ketones, ur: NEGATIVE mg/dL
Nitrite: NEGATIVE
Protein, ur: NEGATIVE mg/dL
Specific Gravity, Urine: >1.046 — ABNORMAL HIGH (ref 1.005–1.030)
WBC, UA: >50 WBC/hpf — ABNORMAL HIGH (ref 0–5)
pH: 7 (ref 5.0–8.0)

## 2021-10-06 LAB — LACTIC ACID, PLASMA
Lactic Acid, Venous: 3.7 mmol/L (ref 0.5–1.9)
Lactic Acid, Venous: 3.2 mmol/L (ref 0.5–1.9)

## 2021-10-06 LAB — PROTIME-INR
INR: 1 (ref 0.8–1.2)
Prothrombin Time: 13.2 seconds (ref 11.4–15.2)

## 2021-10-06 MED ORDER — IOHEXOL 300 MG/ML  SOLN
100.0000 mL | Freq: Once | INTRAMUSCULAR | Status: AC | PRN
  Administered 2021-10-06: 100 mL via INTRAVENOUS

## 2021-10-06 MED ORDER — ACETAMINOPHEN 325 MG PO TABS: 650.0000 mg | ORAL_TABLET | Freq: Four times a day (QID) | ORAL | Status: DC | PRN

## 2021-10-06 MED ORDER — ACETAMINOPHEN 650 MG RE SUPP: 650.0000 mg | Freq: Four times a day (QID) | RECTAL | Status: DC | PRN

## 2021-10-06 MED ORDER — LACTATED RINGERS IV BOLUS (SEPSIS)
1000.0000 mL | Freq: Once | INTRAVENOUS | Status: AC
  Administered 2021-10-06: 1000 mL via INTRAVENOUS

## 2021-10-06 MED ORDER — SODIUM CHLORIDE 0.9 % IV SOLN
2.0000 g | Freq: Once | INTRAVENOUS | Status: AC
  Administered 2021-10-06: 2 g via INTRAVENOUS
  Filled 2021-10-06: qty 20

## 2021-10-06 MED ORDER — FENTANYL CITRATE PF 50 MCG/ML IJ SOSY
50.0000 ug | PREFILLED_SYRINGE | Freq: Once | INTRAMUSCULAR | Status: AC
  Administered 2021-10-06: 50 ug via INTRAVENOUS
  Filled 2021-10-06: qty 1

## 2021-10-06 MED ORDER — TRAZODONE HCL 50 MG PO TABS
25.0000 mg | ORAL_TABLET | Freq: Every evening | ORAL | Status: DC | PRN
  Administered 2021-10-07 – 2021-10-10 (×4): 25 mg via ORAL
  Filled 2021-10-06 (×4): qty 1

## 2021-10-06 MED ORDER — SODIUM CHLORIDE 0.9 % IV BOLUS
1250.0000 mL | Freq: Once | INTRAVENOUS | Status: AC
  Administered 2021-10-06: 1250 mL via INTRAVENOUS

## 2021-10-06 MED ORDER — MAGNESIUM HYDROXIDE 400 MG/5ML PO SUSP: 30.0000 mL | Freq: Every day | ORAL | Status: DC | PRN

## 2021-10-06 MED ORDER — SODIUM CHLORIDE 0.9 % IV SOLN: INTRAVENOUS | Status: DC

## 2021-10-06 MED ORDER — LACTATED RINGERS IV SOLN: INTRAVENOUS | Status: AC

## 2021-10-06 MED ORDER — ENOXAPARIN SODIUM 40 MG/0.4ML IJ SOSY
40.0000 mg | PREFILLED_SYRINGE | INTRAMUSCULAR | Status: DC
  Administered 2021-10-06 – 2021-10-07 (×2): 40 mg via SUBCUTANEOUS
  Filled 2021-10-06 (×2): qty 0.4

## 2021-10-06 MED ORDER — ONDANSETRON HCL 4 MG/2ML IJ SOLN
4.0000 mg | Freq: Four times a day (QID) | INTRAMUSCULAR | Status: DC | PRN
Start: 2021-10-06 — End: 2021-10-11
  Administered 2021-10-07: 4 mg via INTRAVENOUS
  Filled 2021-10-06: qty 2

## 2021-10-06 MED ORDER — METRONIDAZOLE 500 MG/100ML IV SOLN
500.0000 mg | Freq: Once | INTRAVENOUS | Status: AC
  Administered 2021-10-06: 500 mg via INTRAVENOUS
  Filled 2021-10-06: qty 100

## 2021-10-06 MED ORDER — ONDANSETRON HCL 4 MG PO TABS: 4.0000 mg | ORAL_TABLET | Freq: Four times a day (QID) | ORAL | Status: DC | PRN

## 2021-10-06 MED ORDER — SODIUM CHLORIDE 0.9 % IV SOLN
2.0000 g | INTRAVENOUS | Status: DC
  Administered 2021-10-07 – 2021-10-08 (×2): 2 g via INTRAVENOUS
  Filled 2021-10-06 (×2): qty 20

## 2021-10-06 MED ORDER — MORPHINE SULFATE (PF) 2 MG/ML IV SOLN
2.0000 mg | INTRAVENOUS | Status: DC | PRN
Start: 2021-10-06 — End: 2021-10-11
  Administered 2021-10-07 – 2021-10-11 (×11): 2 mg via INTRAVENOUS
  Filled 2021-10-06 (×11): qty 1

## 2021-10-06 NOTE — Assessment & Plan Note (Addendum)
-   We will continue his Seroquel 150 mg p.o. twice daily and trazodone 25 mg p.o. nightly for sleep ?

## 2021-10-06 NOTE — Assessment & Plan Note (Signed)
-   We will continue his Neurontin ?

## 2021-10-06 NOTE — H&P (Addendum)
?  ?  ?[place] ? ? ?PATIENT NAME: Perry Bullock   ? ?MR#:  [id number] ? ?DATE OF BIRTH:  1960-12-30 ? ?DATE OF ADMISSION:  10/06/2021 ? ?PRIMARY CARE PHYSICIAN: [facility]  ? ?Patient is coming from: SNF ? ?REQUESTING/REFERRING PHYSICIAN: Lennice Sites, DO ? ?CHIEF COMPLAINT:  ? ?Chief Complaint  ?Patient presents with  ? Abdominal Pain  ? ? ?HISTORY OF PRESENT ILLNESS:  ?Perry Bullock is a 61 y.o. male with medical history significant for COPD, asthma, bipolar 1 disorder, osteoarthritis, anxiety, polysubstance abuse including tobacco and alcohol, seizure disorder, PVD, grade 1 diastolic dysfunction hepatitis C, DDD and chronic headaches as well as diverticulosis, who presented to the ER with acute onset of generalized abdominal pain for the last day, that is moderate in intensity and has been worsening with associated recurrent nausea and vomiting.  He denies any fever or chills.  His abdomen has been distended despite vomiting.  He stated that he saw occasional black and coffee-ground emesis.  Nothing was witnessed yet in the ER.  She admitted to chronic cough with his COPD without any worsening.  No chest pain or dyspnea or palpitations.  No headache or dizziness or blurred vision.  No presyncope or syncope.  No dysuria, oliguria or hematuria or flank pain.  No bleeding diathesis. ? ?The patient was just admitted at [facility] from 4/3 till 4/7 for syncope and fall with subsequent left acetabular fracture that was nonoperative for which she went to the SNF.  He had a UTI with E. coli that was resistant to ampicillin and Bactrim and Cipro sensitive to ceftriaxone with MIC less than 0.25 which was transitioned to Keflex to complete 5-day course. ? ?ED Course: When he came to the ER heart rate was 108 and respiratory to 16 and later 21 then 23.  Labs revealed mild hyponatremia and borderline potassium of 5, hypochloremia, BUN of 31 with a creatinine of 1.14 and alk phos 135 with albumin of 3.4 and  total protein 6.4.  Lactic acid was 3.7.  CBC showed anemia better than previous levels and thrombocytopenia ?EKG as reviewed by me : EKG showed sinus tachycardia with rate of 124 with right bundle branch block and T wave inversion anteroseptally with Q waves inferiorly. ?Imaging: Portable chest x-ray showed chronic pleural-parenchymal disease without acute cardiopulmonary findings. ? ?Abdominal pelvic CT scan revealed partial small bowel obstruction with no exact transition point but suggested in the right lower quadrant, compression fractures of T10 and T8 11 that are new from the prior CT but likely chronic, hepatic steatosis, and aortic atherosclerosis. ? ?The patient was given 50 mcg of IV fentanyl, 1 L bolus of IV lactated Ringer, IV Rocephin and Flagyl initially and was then placed on 150 mL/h of IV lactated Ringer.  He will be admitted to a medical telemetry bed for further evaluation and management. ?PAST MEDICAL HISTORY:  ? ?Past Medical History:  ?Diagnosis Date  ? Anxiety   ? Arthritis   ? Asthma   ? Bipolar 1 disorder ([place])   ? Blind right eye   ? Broken hip ([place])   ? Chronic back pain   ? Diverticulosis  ? Chronic headaches   ? Convulsion ([place])   ? once a month when stands up too quickly  ? COPD (chronic obstructive pulmonary disease) ([place])   ? DDD (degenerative disc disease), lumbar   ? Diverticulitis   ? GERD (gastroesophageal reflux disease)   ? Glaucoma   ? Hepatitis   ?  hepatitis C  ? Pneumonia   ? PVD (peripheral vascular disease) ([place])   ? lower extremities reddened and feet swollen  ? Seasonal allergies   ? Shortness of breath dyspnea   ? ? ?PAST SURGICAL HISTORY:  ? ?Past Surgical History:  ?Procedure Laterality Date  ? EYE SURGERY Right   ? HIP SURGERY Left   ? INTRAMEDULLARY (IM) NAIL INTERTROCHANTERIC Right 01/16/2017  ? Procedure: INTRAMEDULLARY (IM) NAIL INTERTROCHANTRIC;  Surgeon: Corky Mull, MD;  Location: [facility] ORS;  Service: Orthopedics;  Laterality: Right;  ? INTRAMEDULLARY (IM)  NAIL INTERTROCHANTERIC Left 03/09/2020  ? Procedure: INTRAMEDULLARY (IM) NAIL INTERTROCHANTRIC;  Surgeon: Thornton Park, MD;  Location: [facility] ORS;  Service: Orthopedics;  Laterality: Left;  ? INTUBATION-ENDOTRACHEAL WITH TRACHEOSTOMY STANDBY N/A 04/22/2015  ? Procedure: INTUBATION-ENDOTRACHEAL WITH TRACHEOSTOMY STANDBY;  Surgeon: Beverly Gust, MD;  Location: [facility] ORS;  Service: ENT;  Laterality: N/A;  ? JOINT REPLACEMENT Left   ? Lt shoulder  ? MANDIBLE SURGERY    ? ? ?SOCIAL HISTORY:  ? ?Social History  ? ?Tobacco Use  ? Smoking status: Every Day  ?  Packs/day: 0.00  ?  Years: 35.00  ?  Pack years: 0.00  ?  Types: Cigarettes  ? Smokeless tobacco: Never  ? Tobacco comments:  ?  4 cigs daily--02/27/16, pt has patch  ?Substance Use Topics  ? Alcohol use: Yes  ?  Alcohol/week: 3.0 standard drinks  ?  Types: 3 Cans of beer per week  ?  Comment: pt is cutting back to 3-4 daily  ? ? ?FAMILY HISTORY:  ? ?Family History  ?Problem Relation Age of Onset  ? Breast cancer Mother   ? Hypertension Father   ? Diabetes Father   ? ? ?DRUG ALLERGIES:  ? ?Allergies  ?Allergen Reactions  ? Ace Inhibitors Swelling  ?  Angioedema 10/16 requiring intubation @ OSH 2/2 pt reportedly taking someone else's Lisinopril.  ?Angioedema 10/16 requiring intubation @ OSH 2/2 pt reportedly taking someone else's Lisinopril.  ?  ? Lisinopril Other (See Comments)  ?  unknown  ? ? ?REVIEW OF SYSTEMS:  ? ?ROS ?As per history of present illness. All pertinent systems were reviewed above. Constitutional, HEENT, cardiovascular, respiratory, GI, GU, musculoskeletal, neuro, psychiatric, endocrine, integumentary and hematologic systems were reviewed and are otherwise negative/unremarkable except for positive findings mentioned above in the HPI. ? ? ?MEDICATIONS AT HOME:  ? ?Prior to Admission medications   ?Medication Sig Start Date End Date Taking? Authorizing Provider  ?ADVAIR DISKUS 500-50 MCG/ACT AEPB Inhale 1 puff into the lungs in the morning and at  bedtime. 07/27/21   [provider]  ?albuterol (VENTOLIN HFA) 108 (90 Base) MCG/ACT inhaler Inhale 2 puffs into the lungs every 6 (six) hours as needed for wheezing or shortness of breath. 01/21/21   Allyne Gee, MD  ?bisacodyl (DULCOLAX) 5 MG EC tablet Take 1 tablet (5 mg total) by mouth daily as needed for moderate constipation. 10/04/21   Lorella Nimrod, MD  ?cephALEXin (KEFLEX) 500 MG capsule Take 1 capsule (500 mg total) by mouth every 8 (eight) hours for 4 days. 10/04/21 10/08/21  Lorella Nimrod, MD  ?cetirizine (ZYRTEC) 10 MG tablet Take 10 mg by mouth daily.    [provider]  ?diclofenac Sodium (VOLTAREN) 1 % GEL Apply 1 application. topically as needed.    [provider]  ?feeding supplement (ENSURE ENLIVE / ENSURE PLUS) LIQD Take 237 mLs by mouth 2 (two) times daily between meals. 10/04/21   Lorella Nimrod,  MD  ?ferrous [id number] C-folic acid (TRINSICON / FOLTRIN) capsule Take 1 capsule by mouth 2 (two) times daily after a meal. 10/04/21   Lorella Nimrod, MD  ?fluticasone (FLONASE) 50 MCG/ACT nasal spray Place 2 sprays into both nostrils as needed.     [provider]  ?gabapentin (NEURONTIN) 300 MG capsule Take 900 mg by mouth 3 (three) times daily. 08/26/21   [provider]  ?latanoprost (XALATAN) 0.005 % ophthalmic solution Place 1 drop into both eyes at bedtime. 08/31/21   [provider]  ?Multiple Vitamin (MULTIVITAMIN WITH MINERALS) TABS tablet Take 1 tablet by mouth daily. 03/15/20   Nolberto Hanlon, MD  ?nicotine (NICODERM CQ - DOSED IN MG/24 HOURS) 21 mg/24hr patch Place 1 patch (21 mg total) onto the skin daily. 05/08/20   Luiz Ochoa, NP  ?oxyCODONE (OXY IR/ROXICODONE) 5 MG immediate release tablet Take 1 tablet (5 mg total) by mouth every 4 (four) hours as needed. 10/04/21   Lorella Nimrod, MD  ?pantoprazole (PROTONIX) 40 MG tablet Take 1 tablet (40 mg total) by mouth daily. 11/11/17   Fritzi Mandes, MD  ?QUEtiapine (SEROQUEL XR) 300 MG 24 hr  tablet Take 300 mg by mouth at bedtime. 07/29/21   [provider]  ?thiamine 100 MG tablet Take 1 tablet (100 mg total) by mouth daily. 03/15/20   Nolberto Hanlon, MD  ?tiotropium (SPIRIVA HANDIHALER) 18

## 2021-10-06 NOTE — Assessment & Plan Note (Addendum)
-   We will continue his inhalers with [facility] and patient has albuterol as needed ? ?Tobacco Abuse ?-Smoking cessation counseling given and will continue with nicotine 21 mg transdermal patch q. 20 ?

## 2021-10-06 NOTE — Assessment & Plan Note (Addendum)
Vs. Ileus  ?- The patient will be admitted to a medical telemetry bed for ?- We will keep him n.p.o. for now and advanced diet to CLD  ?- He will be hydrated with IV normal saline. ?-States he has been vomiting black vomitus and states it appeared a little coffee ground when dried so GI was notified  ?-C/w IVF Hydration at 125 mL/hr  ?-Keep K+ above 4 and Mag Level > 2 ?-Patient's Hgb/HCt went from 9.5/29.2 -> 8.1/25.3 -> 7.4/22.7 ?-Consulted general surgery and they feel that he does not have a true small bowel obstruction he likely has a reactive ileus and they are recommending continue to monitor closely ?-KUB done and showed "Slight improvement in the degree of small-bowel distention." ?-We will hold off placing NG tube as he has a soft abdomen is not nauseous or vomiting ?-In the interim general surgery is ordering a Gastrografin small bowel study and pending these results he will go for an EGD tomorrow morning ?

## 2021-10-06 NOTE — Assessment & Plan Note (Addendum)
-  This wasmanifested by sepsis and lactic acid of 3.7. Now LA is trending down and went to 1.4 -> 1.6  ?- He will be aggressively hydrated with IV normal saline and continuing NS at 125 mL/hr ?- Management otherwise as above. ?

## 2021-10-06 NOTE — Assessment & Plan Note (Addendum)
-   This was manifested by tachycardia and tachypnea and suspected source of Infection n the Urine ?- The patient will be placed on IV Rocephin. ?-Urinalysis done showed a hazy appearance with large leukocytes, negative nitrites, few bacteria, greater than 50 WBCs and blood cultures show NGTD at 2 Days ?-CT of the abdomen pelvis done with contrast showed "Partial small bowel obstruction, exact  transition point not defined, but suggested in the right lower quadrant. No other convincing acute abnormality. Compression fractures of T10 and T11 that are new from the prior CT, but likely chronic. Hepatic steatosis. Aortic atherosclerosis." ??- We will follow blood and urine cultures  ?-Urine Cx Showed No Growth  ?-Since Urine is No Growth will likely stop Abx after 5 Days ?

## 2021-10-06 NOTE — ED Provider Notes (Signed)
?[facility] EMERGENCY DEPARTMENT ?Provider Note ? ? ?CSN: [id number] ?Arrival date & time: 10/06/21  1716 ? ?  ? ?History ? ?Chief Complaint  ?Patient presents with  ? Abdominal Pain  ? ? ?Perry Bullock is a 61 y.o. male. ? ? ?Abdominal Pain ?Pain location:  Generalized ?Pain quality: aching   ?Pain radiates to:  Does not radiate ?Pain severity:  Moderate ?Onset quality:  Gradual ?Duration:  1 day ?Timing:  Constant ?Progression:  Worsening ?Chronicity:  New ?Context: not previous surgeries   ?Relieved by:  Nothing ?Worsened by:  Nothing ?Associated symptoms: nausea and vomiting   ?Associated symptoms: no anorexia, no belching, no chest pain, no chills, no constipation, no cough, no diarrhea, no dysuria, no fatigue, no fever, no hematuria, no shortness of breath and no sore throat   ?Risk factors: alcohol abuse   ?Risk factors: has not had multiple surgeries   ? ?  ? ?Home Medications ?Prior to Admission medications   ?Medication Sig Start Date End Date Taking? Authorizing Provider  ?ADVAIR DISKUS 500-50 MCG/ACT AEPB Inhale 1 puff into the lungs in the morning and at bedtime. 07/27/21   [provider]  ?albuterol (VENTOLIN HFA) 108 (90 Base) MCG/ACT inhaler Inhale 2 puffs into the lungs every 6 (six) hours as needed for wheezing or shortness of breath. 01/21/21   Yevonne Pax, MD  ?bisacodyl (DULCOLAX) 5 MG EC tablet Take 1 tablet (5 mg total) by mouth daily as needed for moderate constipation. 10/04/21   Arnetha Courser, MD  ?cephALEXin (KEFLEX) 500 MG capsule Take 1 capsule (500 mg total) by mouth every 8 (eight) hours for 4 days. 10/04/21 10/08/21  Arnetha Courser, MD  ?cetirizine (ZYRTEC) 10 MG tablet Take 10 mg by mouth daily.    [provider]  ?diclofenac Sodium (VOLTAREN) 1 % GEL Apply 1 application. topically as needed.    [provider]  ?feeding supplement (ENSURE ENLIVE / ENSURE PLUS) LIQD Take 237 mLs by mouth 2 (two) times daily between meals. 10/04/21   Arnetha Courser, MD  ?ferrous fumarate-b12-vitamic C-folic acid (TRINSICON / FOLTRIN) capsule Take 1 capsule by mouth 2 (two) times daily after a meal. 10/04/21   Arnetha Courser, MD  ?fluticasone (FLONASE) 50 MCG/ACT nasal spray Place 2 sprays into both nostrils as needed.     [provider]  ?gabapentin (NEURONTIN) 300 MG capsule Take 900 mg by mouth 3 (three) times daily. 08/26/21   [provider]  ?latanoprost (XALATAN) 0.005 % ophthalmic solution Place 1 drop into both eyes at bedtime. 08/31/21   [provider]  ?Multiple Vitamin (MULTIVITAMIN WITH MINERALS) TABS tablet Take 1 tablet by mouth daily. 03/15/20   Lynn Ito, MD  ?nicotine (NICODERM CQ - DOSED IN MG/24 HOURS) 21 mg/24hr patch Place 1 patch (21 mg total) onto the skin daily. 05/08/20   Theotis Burrow, NP  ?oxyCODONE (OXY IR/ROXICODONE) 5 MG immediate release tablet Take 1 tablet (5 mg total) by mouth every 4 (four) hours as needed. 10/04/21   Arnetha Courser, MD  ?pantoprazole (PROTONIX) 40 MG tablet Take 1 tablet (40 mg total) by mouth daily. 11/11/17   Enedina Finner, MD  ?QUEtiapine (SEROQUEL XR) 300 MG 24 hr tablet Take 300 mg by mouth at bedtime. 07/29/21   [provider]  ?thiamine 100 MG tablet Take 1 tablet (100 mg total) by mouth daily. 03/15/20   Lynn Ito, MD  ?tiotropium (SPIRIVA HANDIHALER) 18 MCG inhalation capsule Place 1 capsule (18 mcg  total) into inhaler and inhale daily. 01/21/21   Yevonne Pax, MD  ?   ? ?Allergies    ?Ace inhibitors   ? ?Review of Systems   ?Review of Systems  ?Constitutional:  Negative for chills, fatigue and fever.  ?HENT:  Negative for sore throat.   ?Respiratory:  Negative for cough and shortness of breath.   ?Cardiovascular:  Negative for chest pain.  ?Gastrointestinal:  Positive for abdominal pain, nausea and vomiting. Negative for anorexia, constipation and diarrhea.  ?Genitourinary:  Negative for dysuria and hematuria.  ? ?Physical Exam ?Updated Vital Signs ? ?ED Triage Vitals  ?Enc  Vitals Group  ?   BP 10/06/21 1726 136/81  ?   Pulse Rate 10/06/21 1726 (!) 128  ?   Resp 10/06/21 1726 18  ?   Temp 10/06/21 1726 100 ?F (37.8 ?C)  ?   Temp Source 10/06/21 1726 Oral  ?   SpO2 10/06/21 1722 93 %  ?   Weight 10/06/21 1726 155 lb (70.3 kg)  ?   Height 10/06/21 1726 5\' 11"  (1.803 m)  ?   Head Circumference --   ?   Peak Flow --   ?   Pain Score 10/06/21 1726 4  ?   Pain Loc --   ?   Pain Edu? --   ?   Excl. in GC? --   ? ? ?Physical Exam ?Vitals and nursing note reviewed.  ?Constitutional:   ?   General: He is not in acute distress. ?   Appearance: He is well-developed. He is not ill-appearing.  ?HENT:  ?   Head: Normocephalic and atraumatic.  ?Eyes:  ?   Conjunctiva/sclera: Conjunctivae normal.  ?Cardiovascular:  ?   Rate and Rhythm: Normal rate and regular rhythm.  ?   Heart sounds: No murmur heard. ?Pulmonary:  ?   Effort: Pulmonary effort is normal. No respiratory distress.  ?   Breath sounds: Normal breath sounds.  ?Abdominal:  ?   Palpations: Abdomen is soft.  ?   Tenderness: There is no abdominal tenderness.  ?Musculoskeletal:     ?   General: No swelling.  ?   Cervical back: Neck supple.  ?Skin: ?   General: Skin is warm and dry.  ?   Capillary Refill: Capillary refill takes less than 2 seconds.  ?Neurological:  ?   Mental Status: He is alert.  ?Psychiatric:     ?   Mood and Affect: Mood normal.  ? ? ?ED Results / Procedures / Treatments   ?Labs ?(all labs ordered are listed, but only abnormal results are displayed) ?Labs Reviewed  ?LACTIC ACID, PLASMA - Abnormal; Notable for the following components:  ?    Result Value  ? Lactic Acid, Venous 3.7 (*)   ? All other components within normal limits  ?COMPREHENSIVE METABOLIC PANEL - Abnormal; Notable for the following components:  ? Sodium 134 (*)   ? Chloride 91 (*)   ? Glucose, Bld 135 (*)   ? BUN 31 (*)   ? Total Protein 6.4 (*)   ? Albumin 3.4 (*)   ? Alkaline Phosphatase 135 (*)   ? All other components within normal limits  ?URINALYSIS,  ROUTINE W REFLEX MICROSCOPIC - Abnormal; Notable for the following components:  ? APPearance HAZY (*)   ? Specific Gravity, Urine >1.046 (*)   ? Leukocytes,Ua LARGE (*)   ? WBC, UA >50 (*)   ? Bacteria, UA FEW (*)   ? All other  components within normal limits  ?CBC WITH DIFFERENTIAL/PLATELET - Abnormal; Notable for the following components:  ? RBC 2.78 (*)   ? Hemoglobin 9.5 (*)   ? HCT 29.2 (*)   ? MCV 105.0 (*)   ? MCH 34.2 (*)   ? Platelets 427 (*)   ? Monocytes Absolute 1.9 (*)   ? All other components within normal limits  ?RESP PANEL BY RT-PCR (FLU A&B, COVID) [id number]  ?CULTURE, BLOOD (ROUTINE X 2)  ?CULTURE, BLOOD (ROUTINE X 2)  ?URINE CULTURE  ?PROTIME-INR  ?APTT  ?LACTIC ACID, PLASMA  ?CBC WITH DIFFERENTIAL/PLATELET  ? ? ?EKG ?None ? ?Radiology ?CT ABDOMEN PELVIS W CONTRAST ? ?Result Date: 10/06/2021 ?CLINICAL DATA:  Abdominal pain and fever. EXAM: CT ABDOMEN AND PELVIS WITH CONTRAST TECHNIQUE: Multidetector CT imaging of the abdomen and pelvis was performed using the standard protocol following bolus administration of intravenous contrast. RADIATION DOSE REDUCTION: This exam was performed according to the departmental dose-optimization program which includes automated exposure control, adjustment of the mA and/or kV according to patient size and/or use of iterative reconstruction technique. CONTRAST:  100mL OMNIPAQUE IOHEXOL 300 MG/ML  SOLN COMPARISON:  04/22/2018. FINDINGS: Lower chest: No acute abnormality. Hepatobiliary: Decreased attenuation of the liver consistent with fatty infiltration. No liver mass. Normal gallbladder. No bile duct dilation. Pancreas: Unremarkable. No pancreatic ductal dilatation or surrounding inflammatory changes. Spleen: Normal in size without focal abnormality. Adrenals/Urinary Tract: Adrenal glands are unremarkable. Kidneys are normal, without renal calculi, focal lesion, or hydronephrosis. Bladder is unremarkable. Stomach/Bowel: Dilated small bowel with air-fluid levels. Distal  small bowel is decompressed. Transition point is not definitive, but is most likely in the right lower quadrant near the lower ascending colon. No small bowel wall thickening or adjacent inflammation. There a

## 2021-10-06 NOTE — Sepsis Progress Note (Signed)
Sepsis protocol monitored by eLink 

## 2021-10-06 NOTE — Assessment & Plan Note (Addendum)
-  This is a recent previous problem for which the patient was admitted to rehab. ?- Pain management will be provided and we will continue morphine IV 2 mg every 4 as needed for moderate and severe pain as well as p.o. oxycodone 5 mg every 4 as needed for moderate-severe pain as breakthrough ?

## 2021-10-06 NOTE — ED Triage Notes (Signed)
Pt arrived by EMS from rehab facility. Pt called because he found a mass near is navel that was causing him pain. He also started to vomit coffee ground looking emesis.  ? ?Pt is in rehab for left sided hip fracture.  ? ?  ?

## 2021-10-07 ENCOUNTER — Inpatient Hospital Stay (HOSPITAL_COMMUNITY): Payer: Medicaid Other

## 2021-10-07 DIAGNOSIS — K56609 Unspecified intestinal obstruction, unspecified as to partial versus complete obstruction: Secondary | ICD-10-CM | POA: Diagnosis not present

## 2021-10-07 DIAGNOSIS — J449 Chronic obstructive pulmonary disease, unspecified: Secondary | ICD-10-CM | POA: Diagnosis not present

## 2021-10-07 DIAGNOSIS — S32401A Unspecified fracture of right acetabulum, initial encounter for closed fracture: Secondary | ICD-10-CM

## 2021-10-07 DIAGNOSIS — A419 Sepsis, unspecified organism: Secondary | ICD-10-CM | POA: Diagnosis not present

## 2021-10-07 DIAGNOSIS — D539 Nutritional anemia, unspecified: Secondary | ICD-10-CM

## 2021-10-07 LAB — PROTIME-INR
INR: 1.1 (ref 0.8–1.2)
Prothrombin Time: 14 seconds (ref 11.4–15.2)

## 2021-10-07 LAB — PROCALCITONIN: Procalcitonin: 0.27 ng/mL

## 2021-10-07 LAB — CBC
HCT: 25.3 % — ABNORMAL LOW (ref 39.0–52.0)
Hemoglobin: 8.1 g/dL — ABNORMAL LOW (ref 13.0–17.0)
MCH: 33.9 pg (ref 26.0–34.0)
MCHC: 32 g/dL (ref 30.0–36.0)
MCV: 105.9 fL — ABNORMAL HIGH (ref 80.0–100.0)
Platelets: 365 10*3/uL (ref 150–400)
RBC: 2.39 MIL/uL — ABNORMAL LOW (ref 4.22–5.81)
RDW: 14.5 % (ref 11.5–15.5)
WBC: 8.6 10*3/uL (ref 4.0–10.5)
nRBC: 0 % (ref 0.0–0.2)

## 2021-10-07 LAB — URINE CULTURE: Culture: NO GROWTH

## 2021-10-07 LAB — BASIC METABOLIC PANEL
Anion gap: 7 (ref 5–15)
BUN: 21 mg/dL — ABNORMAL HIGH (ref 6–20)
CO2: 27 mmol/L (ref 22–32)
Calcium: 7.7 mg/dL — ABNORMAL LOW (ref 8.9–10.3)
Chloride: 103 mmol/L (ref 98–111)
Creatinine, Ser: 0.91 mg/dL (ref 0.61–1.24)
GFR, Estimated: >60 mL/min (ref >60)
Glucose, Bld: 95 mg/dL (ref 70–99)
Potassium: 3.9 mmol/L (ref 3.5–5.1)
Sodium: 137 mmol/L (ref 135–145)

## 2021-10-07 LAB — LACTIC ACID, PLASMA
Lactic Acid, Venous: 1.4 mmol/L (ref 0.5–1.9)
Lactic Acid, Venous: 1.6 mmol/L (ref 0.5–1.9)

## 2021-10-07 LAB — CORTISOL-AM, BLOOD: Cortisol - AM: 13.7 ug/dL (ref 6.7–22.6)

## 2021-10-07 MED ORDER — OXYCODONE HCL 5 MG PO TABS
5.0000 mg | ORAL_TABLET | ORAL | Status: DC | PRN
  Administered 2021-10-07 – 2021-10-11 (×13): 5 mg via ORAL
  Filled 2021-10-07 (×14): qty 1

## 2021-10-07 MED ORDER — ADULT MULTIVITAMIN W/MINERALS CH
1.0000 | ORAL_TABLET | Freq: Every day | ORAL | Status: DC
  Administered 2021-10-07 – 2021-10-11 (×4): 1 via ORAL
  Filled 2021-10-07 (×4): qty 1

## 2021-10-07 MED ORDER — ALBUTEROL SULFATE (2.5 MG/3ML) 0.083% IN NEBU: 2.5000 mg | INHALATION_SOLUTION | Freq: Four times a day (QID) | RESPIRATORY_TRACT | Status: DC | PRN

## 2021-10-07 MED ORDER — QUETIAPINE FUMARATE ER 50 MG PO TB24
150.0000 mg | ORAL_TABLET | Freq: Two times a day (BID) | ORAL | Status: DC
  Administered 2021-10-07 – 2021-10-11 (×7): 150 mg via ORAL
  Filled 2021-10-07 (×12): qty 3

## 2021-10-07 MED ORDER — FE FUMARATE-B12-VIT C-FA-IFC PO CAPS
1.0000 | ORAL_CAPSULE | Freq: Two times a day (BID) | ORAL | Status: DC
  Administered 2021-10-07 – 2021-10-11 (×6): 1 via ORAL
  Filled 2021-10-07 (×12): qty 1

## 2021-10-07 MED ORDER — UMECLIDINIUM BROMIDE 62.5 MCG/ACT IN AEPB
1.0000 | INHALATION_SPRAY | Freq: Every day | RESPIRATORY_TRACT | Status: DC
  Administered 2021-10-07 – 2021-10-11 (×4): 1 via RESPIRATORY_TRACT
  Filled 2021-10-07: qty 7

## 2021-10-07 MED ORDER — LORATADINE 10 MG PO TABS
10.0000 mg | ORAL_TABLET | Freq: Every day | ORAL | Status: DC
  Administered 2021-10-07 – 2021-10-11 (×4): 10 mg via ORAL
  Filled 2021-10-07 (×4): qty 1

## 2021-10-07 MED ORDER — ENSURE ENLIVE PO LIQD
237.0000 mL | Freq: Two times a day (BID) | ORAL | Status: DC
  Administered 2021-10-09 – 2021-10-11 (×4): 237 mL via ORAL

## 2021-10-07 MED ORDER — MOMETASONE FURO-FORMOTEROL FUM 200-5 MCG/ACT IN AERO
2.0000 | INHALATION_SPRAY | Freq: Two times a day (BID) | RESPIRATORY_TRACT | Status: DC
  Administered 2021-10-07 – 2021-10-11 (×8): 2 via RESPIRATORY_TRACT
  Filled 2021-10-07 (×2): qty 8.8

## 2021-10-07 MED ORDER — DICLOFENAC SODIUM 1 % EX GEL: 1.0000 "application " | Freq: Every day | CUTANEOUS | Status: DC | PRN

## 2021-10-07 MED ORDER — NICOTINE 21 MG/24HR TD PT24
21.0000 mg | MEDICATED_PATCH | Freq: Every day | TRANSDERMAL | Status: DC
  Filled 2021-10-07 (×3): qty 1

## 2021-10-07 MED ORDER — GABAPENTIN 300 MG PO CAPS
300.0000 mg | ORAL_CAPSULE | Freq: Three times a day (TID) | ORAL | Status: DC
  Administered 2021-10-07 – 2021-10-11 (×13): 300 mg via ORAL
  Filled 2021-10-07 (×13): qty 1

## 2021-10-07 MED ORDER — FLUTICASONE PROPIONATE 50 MCG/ACT NA SUSP: 2.0000 | Freq: Every day | NASAL | Status: DC | PRN

## 2021-10-07 MED ORDER — THIAMINE HCL 100 MG PO TABS
100.0000 mg | ORAL_TABLET | Freq: Every day | ORAL | Status: DC
  Administered 2021-10-07 – 2021-10-11 (×4): 100 mg via ORAL
  Filled 2021-10-07 (×4): qty 1

## 2021-10-07 MED ORDER — LATANOPROST 0.005 % OP SOLN
1.0000 [drp] | Freq: Every day | OPHTHALMIC | Status: DC
  Administered 2021-10-07 – 2021-10-10 (×5): 1 [drp] via OPHTHALMIC
  Filled 2021-10-07: qty 2.5

## 2021-10-07 MED ORDER — BISACODYL 5 MG PO TBEC
5.0000 mg | DELAYED_RELEASE_TABLET | Freq: Every day | ORAL | Status: DC
  Administered 2021-10-07 – 2021-10-11 (×4): 5 mg via ORAL
  Filled 2021-10-07 (×4): qty 1

## 2021-10-07 NOTE — Assessment & Plan Note (Addendum)
Concern for an Upper GI bleed ?-Patient's Hgb/Hct went from 9.5/29.2 -> 8.1/25.3 and is further dropped to 7.4/22.7 with an MCV of 105.1 ?-Likely dilutional Drop with IV fluid resuscitation but because he had some "coffee-ground emesis" GI has been consulted and are planning EGD ?-Check FOBT as he had some "coffee ground" emesis and a cystoscopy pending to be done but he did have a black stool noted ?-Anemia panel done and showed an iron level of 26, TIBC 246, TIBC 272, saturation ratios of 10%, ferritin level 121, folate level of 30.7 and vitamin B12 of 468 ?-Patient has been taking ferrous fumarate B12-vitamin C-folic acid capsule p.o. twice daily ?-We will hold off his pharmacological VTE prophylaxis and discontinue his enoxaparin for now given his drop in hemoglobin ?-Patient is on a PPI IV twice daily ?-Continue to monitor for signs and symptoms of bleeding; no overt bleeding noted ?-Repeat CBC in a.m and appreciate GI evaluation recommendations and they are planning for an EGD in the a.m. ?

## 2021-10-07 NOTE — Progress Notes (Signed)
Pt arrived to room 6N07 via stretcher from the ED. Received report from [place], [employer] fundraiser. See assessment. Will continue to monitor.  ?

## 2021-10-07 NOTE — Plan of Care (Signed)

## 2021-10-07 NOTE — Progress Notes (Signed)
?PROGRESS NOTE ? ? ? Perry Bullock  HRC:[id number] DOB: 1961-01-17 DOA: 10/06/2021 ?PCP: [facility]  ? ?Brief Narrative:  ?HPI per Dr. Eugenie Norrie ?Perry Bullock is a 61 y.o. male with medical history significant for COPD, asthma, bipolar 1 disorder, osteoarthritis, anxiety, polysubstance abuse including tobacco and alcohol, seizure disorder, PVD, grade 1 diastolic dysfunction hepatitis C, DDD and chronic headaches as well as diverticulosis, who presented to the ER with acute onset of generalized abdominal pain for the last day, that is moderate in intensity and has been worsening with associated recurrent nausea and vomiting.  He denies any fever or chills.  His abdomen has been distended despite vomiting.  He stated that he saw occasional black and coffee-ground emesis.  Nothing was witnessed yet in the ER.  She admitted to chronic cough with his COPD without any worsening.  No chest pain or dyspnea or palpitations.  No headache or dizziness or blurred vision.  No presyncope or syncope.  No dysuria, oliguria or hematuria or flank pain.  No bleeding diathesis. ? ?The patient was just admitted at [facility] from 4/3 till 4/7 for syncope and fall with subsequent left acetabular fracture that was nonoperative for which she went to the SNF.  He had a UTI with E. coli that was resistant to ampicillin and Bactrim and Cipro sensitive to ceftriaxone with MIC less than 0.25 which was transitioned to Keflex to complete 5-day course. ? ?ED Course: When he came to the ER heart rate was 108 and respiratory to 16 and later 21 then 23.  Labs revealed mild hyponatremia and borderline potassium of 5, hypochloremia, BUN of 31 with a creatinine of 1.14 and alk phos 135 with albumin of 3.4 and total protein 6.4.  Lactic acid was 3.7.  CBC showed anemia better than previous levels and thrombocytopenia ?EKG as reviewed by me : EKG showed sinus tachycardia with rate of 124 with right bundle branch block and T wave inversion  anteroseptally with Q waves inferiorly. ?Imaging: Portable chest x-ray showed chronic pleural-parenchymal disease without acute cardiopulmonary findings. ? ?Abdominal pelvic CT scan revealed partial small bowel obstruction with no exact transition point but suggested in the right lower quadrant, compression fractures of T10 and T8 11 that are new from the prior CT but likely chronic, hepatic steatosis, and aortic atherosclerosis. ? ?The patient was given 50 mcg of IV fentanyl, 1 L bolus of IV lactated Ringer, IV Rocephin and Flagyl initially and was then placed on 150 mL/h of IV lactated Ringer.  He will be admitted to a medical telemetry bed for further evaluation and management. ? ?**Interim History ?States that he had a bowel movement but was not very big.  Continues to have some urinary symptoms.  Also complains some abdominal discomfort and pain as well as some distention. Two view Abd X-Ray showed "Moderately dilated small bowel loops with gas in colon, compatible ?with partial small bowel obstruction seen on recent CT ?abdomen/pelvis."  Patient was also complained about some coffee-ground emesis so we will discuss with general surgery as well as gastroenterology.  We will continue to treat his sepsis with UTI.  We will repeat a KUB in the morning. ?   ?Assessment and Plan: ?* SBO (small bowel obstruction) ([place]) ?- The patient will be admitted to a medical telemetry bed for ?- We will keep him n.p.o. for now ?- He will be hydrated with IV normal saline. ?-States he has been vomiting black vomitus and states it appeared  a little coffee ground when dried  ?-C/w IVF Hydration ?-Keep K+ above 4 and Mag Level > 2 ?-Patient's Hgb/HCt went from 9.5/29.2 -> 8.1/25.3 ?-Will consult General Surgery formally in the AM; May need NG ?-KUB done and showed "Moderately dilated small bowel loops, compatible with partial small bowel obstruction seen on recent CT abdomen/pelvis. There is some gas within colon. No evidence of  free air or portal venous gas. No abnormal mass effect or pathologic calcifications. Partially imaged bilateral proximal femur hardware. Polyarticular degenerative change." ? ?Sepsis due to gram-negative UTI ([place]) ?- This is manifested by tachycardia and tachypnea and source of Infection n the Urine ?- The patient will be placed on IV Rocephin. ?-Urinalysis done showed a hazy appearance with large leukocytes, negative nitrites, few bacteria, greater than 50 WBCs and blood cultures still pending ?-CT of the abdomen pelvis done with contrast showed "Partial small bowel obstruction, exact  transition point not defined, but suggested in the right lower quadrant. No other convincing acute abnormality. Compression fractures of T10 and T11 that are new from the prior CT, but likely chronic. Hepatic steatosis. Aortic atherosclerosis." ? - We will follow blood and urine cultures  ?-Urine Cx Showed No Growth  ? ?Acetabular fracture ([place]) ?-This is a recent previous problem for which the patient was admitted to rehab. ?- Pain management will be provided. ? ?Severe sepsis ([place]) ?-This is manifested by sepsis and lactic acid of 3.7. Now LA is trending down and went to 1.4 -> 1.6 ?- He will be aggressively hydrated with IV normal saline. ?- Management otherwise as above. ? ?COPD (chronic obstructive pulmonary disease) ([place]) ?- We will continue his inhalers. ? ?Macrocytic anemia ?-Patient's Hgb/Hct went from 9.5/29.2 -> 8.1/25.3 with an MCV of 105.9 ?-Likely dilutional Drop with IV fluid resuscitation ?-Check FOBT as he had some "coffee ground" emesis ?-Check Anemia Panel in the AM ?-Continue to monitor for signs and symptoms of bleeding; no overt bleeding noted ?-Repeat CBC in a.m. ? ?Seizure disorder ([place]) ?- We will continue his Neurontin. ? ?Bipolar 1 disorder ([place]) ?- We will continue his Seroquel. ? ?DVT prophylaxis: enoxaparin (LOVENOX) injection 40 mg Start: 10/06/21 2115 ? ?  Code Status: Full Code ?Family Communication:  No family present at bedside  ? ?Disposition Plan:  ?Level of care: Telemetry Medical ?Status is: Inpatient ?Remains inpatient appropriate because: Needs further clinical Improvement ?  ?Consultants:  ?Will formally consult General Surgery ? ?Procedures:  ?None ? ?Antimicrobials:  ?Anti-infectives (From admission, onward)  ? ? Start     Dose/Rate Route Frequency Ordered Stop  ? 10/07/21 1800  cefTRIAXone (ROCEPHIN) 2 g in sodium chloride 0.9 % 100 mL IVPB       ? 2 g ?200 mL/hr over 30 Minutes Intravenous Every 24 hours 10/06/21 2108 10/14/21 1759  ? 10/06/21 1745  cefTRIAXone (ROCEPHIN) 2 g in sodium chloride 0.9 % 100 mL IVPB       ? 2 g ?200 mL/hr over 30 Minutes Intravenous  Once 10/06/21 1733 10/06/21 1840  ? 10/06/21 1745  metroNIDAZOLE (FLAGYL) IVPB 500 mg       ? 500 mg ?100 mL/hr over 60 Minutes Intravenous  Once 10/06/21 1733 10/06/21 1917  ? ?  ?  ? ?Subjective: ?Seen and examined at bedside states that he is having some abdominal discomfort.  States that he has small bowel movement.  No nausea or vomiting.  Denies any lightheadedness or dizziness.  States that he had some black vomit that looks like "  coffee ground".  No nausea or vomiting now.  Denies any lightheadedness or dizziness.  Other concerns or complaints at this time. ? ?Objective: ?Vitals:  ? 10/07/21 1515 10/07/21 1609 10/07/21 1735 10/07/21 1802  ?BP: (!) 132/92  132/86   ?Pulse: 96  99 94  ?Resp: (!) 22  18   ?Temp:  97.6 ?F (36.4 ?C) 98.3 ?F (36.8 ?C)   ?TempSrc:   Oral   ?SpO2: 99%   99%  ?Weight:      ?Height:      ? ? ?Intake/Output Summary (Last 24 hours) at 10/07/2021 2015 ?Last data filed at 10/07/2021 1855 ?Gross per 24 hour  ?Intake 6353.22 ml  ?Output 225 ml  ?Net 6128.22 ml  ? ?Filed Weights  ? 10/06/21 1726  ?Weight: 70.3 kg  ? ?Examination: ?Physical Exam: ? ?Constitutional: Chronically ill-appearing Caucasian male currently no acute distress ?Respiratory: Diminished to auscultation bilaterally with coarse breath sounds, no  wheezing, rales, rhonchi or crackles. Normal respiratory effort and patient is not tachypenic. No accessory muscle use.  Unlabored breathing ?Cardiovascular: RRR, no murmurs / rubs / gallops. S1 and S2 auscu

## 2021-10-07 NOTE — ED Notes (Signed)
ED TO INPATIENT HANDOFF REPORT ? ?ED Nurse Name and Phone #: Rodel Glaspy RN [phone] ? ?S ?Name/Age/Gender ?Perry Bullock ?61 y.o. ?male ?Room/Bed: 009C/009C ? ?Code Status ?  Code Status: Full Code ? ?Home/SNF/Other ?Skilled nursing facility ?Patient oriented to: self, place, time, and situation ?Is this baseline? Yes  ? ?Triage Complete: Triage complete  ?Chief Complaint ?SBO (small bowel obstruction) ([place]) [K56.609] ? ?Triage Note ?Pt arrived by EMS from rehab facility. Pt called because he found a mass near is navel that was causing him pain. He also started to vomit coffee ground looking emesis.  ? ?Pt is in rehab for left sided hip fracture.  ? ?   ? ?Allergies ?Allergies  ?Allergen Reactions  ? Ace Inhibitors Swelling  ?  Angioedema 10/16 requiring intubation @ OSH 2/2 pt reportedly taking someone else's Lisinopril.  ?Angioedema 10/16 requiring intubation @ OSH 2/2 pt reportedly taking someone else's Lisinopril.  ?  ? Lisinopril Other (See Comments)  ?  unknown  ? ? ?Level of Care/Admitting Diagnosis ?ED Disposition   ? ? ED Disposition  ?Admit  ? Condition  ?--  ? Comment  ?Hospital Area: [facility] [id number] ? Level of Care: Telemetry Medical [104] ? May admit patient to [facility] or [facility] if equivalent level of care is available:: No ? Covid Evaluation: Asymptomatic - no recent exposure (last 10 days) testing not required ? Diagnosis: SBO (small bowel obstruction) (Perry Bullock) [id number] ? Admitting Physician: Christel Mormon [id number] ? Attending Physician: Christel Mormon [id number] ? Estimated length of stay: past midnight tomorrow ? Certification:: I certify this patient will need inpatient services for at least 2 midnights ?  ?  ? ?  ? ? ?B ?Medical/Surgery History ?Past Medical History:  ?Diagnosis Date  ? Anxiety   ? Arthritis   ? Asthma   ? Bipolar 1 disorder ([place])   ? Blind right eye   ? Broken hip ([place])   ? Chronic back pain   ? Diverticulosis  ? Chronic headaches   ? Convulsion ([place])   ?  once a month when stands up too quickly  ? COPD (chronic obstructive pulmonary disease) ([place])   ? DDD (degenerative disc disease), lumbar   ? Diverticulitis   ? GERD (gastroesophageal reflux disease)   ? Glaucoma   ? Hepatitis   ? hepatitis C  ? Pneumonia   ? PVD (peripheral vascular disease) ([place])   ? lower extremities reddened and feet swollen  ? Seasonal allergies   ? Shortness of breath dyspnea   ? ?Past Surgical History:  ?Procedure Laterality Date  ? EYE SURGERY Right   ? HIP SURGERY Left   ? INTRAMEDULLARY (IM) NAIL INTERTROCHANTERIC Right 01/16/2017  ? Procedure: INTRAMEDULLARY (IM) NAIL INTERTROCHANTRIC;  Surgeon: Corky Mull, MD;  Location: [facility] ORS;  Service: Orthopedics;  Laterality: Right;  ? INTRAMEDULLARY (IM) NAIL INTERTROCHANTERIC Left 03/09/2020  ? Procedure: INTRAMEDULLARY (IM) NAIL INTERTROCHANTRIC;  Surgeon: Thornton Park, MD;  Location: [facility] ORS;  Service: Orthopedics;  Laterality: Left;  ? INTUBATION-ENDOTRACHEAL WITH TRACHEOSTOMY STANDBY N/A 04/22/2015  ? Procedure: INTUBATION-ENDOTRACHEAL WITH TRACHEOSTOMY STANDBY;  Surgeon: Beverly Gust, MD;  Location: [facility] ORS;  Service: ENT;  Laterality: N/A;  ? JOINT REPLACEMENT Left   ? Lt shoulder  ? MANDIBLE SURGERY    ?  ? ?A ?IV Location/Drains/Wounds ?Patient Lines/Drains/Airways Status   ? ? Active Line/Drains/Airways   ? ? Name Placement date Placement time Site Days  ? Peripheral IV 10/06/21 20 G  Posterior;Right Forearm 10/06/21  1739  Forearm  1  ? Peripheral IV 10/06/21 22 G Right Antecubital 10/06/21  1800  Antecubital  1  ? Wound / Incision (Open or Dehisced) 10/01/21 Skin tear Arm Anterior;Left;Upper 10/01/21  2027  Arm  6  ? ?  ?  ? ?  ? ? ?Intake/Output Last 24 hours ? ?Intake/Output Summary (Last 24 hours) at 10/07/2021 1518 ?Last data filed at 10/07/2021 [id number] ?Gross per 24 hour  ?Intake 5440.72 ml  ?Output --  ?Net 5440.72 ml  ? ? ?Labs/Imaging ?Results for orders placed or performed during the hospital encounter of 10/06/21 (from  the past 48 hour(s))  ?Lactic acid, plasma     Status: Abnormal  ? Collection Time: 10/06/21  5:33 PM  ?Result Value Ref Range  ? Lactic Acid, Venous 3.7 (HH) 0.5 - 1.9 mmol/L  ?  Comment: CRITICAL RESULT CALLED TO, READ BACK BY AND VERIFIED WITH: ?J.DODD,RN 10/06/2021 AT 1900 A.HUGHES ?Performed at [facility], [place] [address] [place], [place] [address] ?  ?Comprehensive metabolic panel     Status: Abnormal  ? Collection Time: 10/06/21  5:33 PM  ?Result Value Ref Range  ? Sodium 134 (L) 135 - 145 mmol/L  ? Potassium 5.0 3.5 - 5.1 mmol/L  ? Chloride 91 (L) 98 - 111 mmol/L  ? CO2 28 22 - 32 mmol/L  ? Glucose, Bld 135 (H) 70 - 99 mg/dL  ?  Comment: Glucose reference range applies only to samples taken after fasting for at least 8 hours.  ? BUN 31 (H) 6 - 20 mg/dL  ? Creatinine, Ser 1.14 0.61 - 1.24 mg/dL  ? Calcium 8.9 8.9 - 10.3 mg/dL  ? Total Protein 6.4 (L) 6.5 - 8.1 g/dL  ? Albumin 3.4 (L) 3.5 - 5.0 g/dL  ? AST 39 15 - 41 U/L  ? ALT 34 0 - 44 U/L  ? Alkaline Phosphatase 135 (H) 38 - 126 U/L  ? Total Bilirubin 0.7 0.3 - 1.2 mg/dL  ? GFR, Estimated >60 >60 mL/min  ?  Comment: (NOTE) ?Calculated using the CKD-EPI Creatinine Equation (2021) ?  ? Anion gap 15 5 - 15  ?  Comment: Performed at [facility], [place] [address] [place], [place] [address]  ?Protime-INR     Status: None  ? Collection Time: 10/06/21  5:33 PM  ?Result Value Ref Range  ? Prothrombin Time 13.2 11.4 - 15.2 seconds  ? INR 1.0 0.8 - 1.2  ?  Comment: (NOTE) ?INR goal varies based on device and disease states. ?Performed at [facility], [place] [address], [place], [place] ?[address] ?  ?APTT     Status: None  ? Collection Time: 10/06/21  5:33 PM  ?Result Value Ref Range  ? aPTT 25 24 - 36 seconds  ?  Comment: Performed at [facility], [place] [address], [place], [place] [address]  ?Urine Culture     Status: None  ? Collection Time: 10/06/21  5:33 PM  ? Specimen: In/Out Cath Urine  ?Result Value Ref Range  ? Specimen Description  IN/OUT CATH URINE   ? Special Requests NONE   ? Culture    ?  NO GROWTH ?Performed at [facility], [place] [address], [place], [place] [address] ?  ? Report Status 10/07/2021 FINAL   ?Blood Culture (routine x 2)     Status: None (Preliminary result)  ? Collection Time: 10/06/21  5:38 PM  ? Specimen:  BLOOD RIGHT WRIST  ?Result Value Ref Range  ? Specimen Description BLOOD RIGHT WRIST   ? Special Requests    ?  BOTTLES DRAWN AEROBIC AND ANAEROBIC Blood Culture adequate volume  ? Culture    ?  NO GROWTH < 24 HOURS ?Performed at [facility], [place] [address] [place], [place] [address] ?  ? Report Status PENDING   ?Resp Panel by RT-PCR (Flu A&B, Covid) Nasopharyngeal Swab     Status: None  ? Collection Time: 10/06/21  6:30 PM  ? Specimen: Nasopharyngeal Swab; Nasopharyngeal(NP) swabs in vial transport medium  ?Result Value Ref Range  ? SARS Coronavirus 2 by RT PCR NEGATIVE NEGATIVE  ?  Comment: (NOTE) ?SARS-CoV-2 target nucleic acids are NOT DETECTED. ? ?The SARS-CoV-2 RNA is generally detectable in upper respiratory ?specimens during the acute phase of infection. The lowest ?concentration of SARS-CoV-2 viral copies this assay can detect is ?138 copies/mL. A negative result does not preclude SARS-Cov-2 ?infection and should not be used as the sole basis for treatment or ?other patient management decisions. A negative result may occur with  ?improper specimen collection/handling, submission of specimen other ?than nasopharyngeal swab, presence of viral mutation(s) within the ?areas targeted by this assay, and inadequate number of viral ?copies(<138 copies/mL). A negative result must be combined with ?clinical observations, patient history, and epidemiological ?information. The expected result is Negative. ? ?Fact Sheet for Patients:  ?EntrepreneurPulse.com.au ? ?Fact Sheet for Healthcare Providers:  ?IncredibleEmployment.be ? ?This test is no t yet approved or cleared by the  [place] FDA and  ?has been authorized for detection and/or diagnosis of SARS-CoV-2 by ?FDA under an Emergency Use Authorization (EUA). This EUA will remain  ?in effect (meaning this test can be used) for

## 2021-10-07 NOTE — Hospital Course (Addendum)
HPI per Dr. Eugenie Norrie ?Perry Bullock is a 61 y.o. male with medical history significant for COPD, asthma, bipolar 1 disorder, osteoarthritis, anxiety, polysubstance abuse including tobacco and alcohol, seizure disorder, PVD, grade 1 diastolic dysfunction hepatitis C, DDD and chronic headaches as well as diverticulosis, who presented to the ER with acute onset of generalized abdominal pain for the last day, that is moderate in intensity and has been worsening with associated recurrent nausea and vomiting.  He denies any fever or chills.  His abdomen has been distended despite vomiting.  He stated that he saw occasional black and coffee-ground emesis.  Nothing was witnessed yet in the ER.  She admitted to chronic cough with his COPD without any worsening.  No chest pain or dyspnea or palpitations.  No headache or dizziness or blurred vision.  No presyncope or syncope.  No dysuria, oliguria or hematuria or flank pain.  No bleeding diathesis. ? ?The patient was just admitted at [facility] from 4/3 till 4/7 for syncope and fall with subsequent left acetabular fracture that was nonoperative for which she went to the SNF.  He had a UTI with E. coli that was resistant to ampicillin and Bactrim and Cipro sensitive to ceftriaxone with MIC less than 0.25 which was transitioned to Keflex to complete 5-day course. ? ?ED Course: When he came to the ER heart rate was 108 and respiratory to 16 and later 21 then 23.  Labs revealed mild hyponatremia and borderline potassium of 5, hypochloremia, BUN of 31 with a creatinine of 1.14 and alk phos 135 with albumin of 3.4 and total protein 6.4.  Lactic acid was 3.7.  CBC showed anemia better than previous levels and thrombocytopenia ?EKG as reviewed by me : EKG showed sinus tachycardia with rate of 124 with right bundle branch block and T wave inversion anteroseptally with Q waves inferiorly. ?Imaging: Portable chest x-ray showed chronic pleural-parenchymal disease without acute  cardiopulmonary findings. ? ?Abdominal pelvic CT scan revealed partial small bowel obstruction with no exact transition point but suggested in the right lower quadrant, compression fractures of T10 and T8 11 that are new from the prior CT but likely chronic, hepatic steatosis, and aortic atherosclerosis. ? ?The patient was given 50 mcg of IV fentanyl, 1 L bolus of IV lactated Ringer, IV Rocephin and Flagyl initially and was then placed on 150 mL/h of IV lactated Ringer.  He will be admitted to a medical telemetry bed for further evaluation and management. ? ?**Interim History ?States that he had a bowel movement but was not very big and again  Continues to have some urinary symptoms.  Also complains some abdominal discomfort and pain as well as some distention. Two view Abd X-Ray showed "Moderately dilated small bowel loops with gas in colon, compatible ?with partial small bowel obstruction seen on recent CT ?abdomen/pelvis."  Patient was also complained about some coffee-ground emesis but has had none since.  His sepsis physiology is improved and repeat KUB shows slight improvement in his partial small bowel obstruction.  General surgery was consulted for further evaluation recommendations and they are recommending continue supportive care and the general surgery team feels that his symptoms are due to a reactive ileus rather than a true obstruction but recommending clear liquid diet and IV fluid hydration with holding of his NG tube placement with the caveat of that he continues to have some nausea vomiting they will place an NGT with a small bowel protocol orally.  GI evaluated and given his  coffee-ground emesis they are planning on EGD in the a.m.  Recommended PPI IV twice daily and turning every 2 hours in bed. ?  ?

## 2021-10-08 ENCOUNTER — Encounter (HOSPITAL_COMMUNITY): Payer: Self-pay | Admitting: Family Medicine

## 2021-10-08 ENCOUNTER — Inpatient Hospital Stay (HOSPITAL_COMMUNITY): Payer: Medicaid Other

## 2021-10-08 DIAGNOSIS — K922 Gastrointestinal hemorrhage, unspecified: Secondary | ICD-10-CM

## 2021-10-08 DIAGNOSIS — K56609 Unspecified intestinal obstruction, unspecified as to partial versus complete obstruction: Secondary | ICD-10-CM | POA: Diagnosis not present

## 2021-10-08 DIAGNOSIS — A4150 Gram-negative sepsis, unspecified: Secondary | ICD-10-CM | POA: Diagnosis not present

## 2021-10-08 DIAGNOSIS — D539 Nutritional anemia, unspecified: Secondary | ICD-10-CM | POA: Diagnosis not present

## 2021-10-08 DIAGNOSIS — F319 Bipolar disorder, unspecified: Secondary | ICD-10-CM

## 2021-10-08 LAB — COMPREHENSIVE METABOLIC PANEL
ALT: 21 U/L (ref 0–44)
AST: 25 U/L (ref 15–41)
Albumin: 2.4 g/dL — ABNORMAL LOW (ref 3.5–5.0)
Alkaline Phosphatase: 85 U/L (ref 38–126)
Anion gap: 7 (ref 5–15)
BUN: 11 mg/dL (ref 6–20)
CO2: 22 mmol/L (ref 22–32)
Calcium: 7.7 mg/dL — ABNORMAL LOW (ref 8.9–10.3)
Chloride: 110 mmol/L (ref 98–111)
Creatinine, Ser: 0.77 mg/dL (ref 0.61–1.24)
GFR, Estimated: >60 mL/min (ref >60)
Glucose, Bld: 79 mg/dL (ref 70–99)
Potassium: 3.5 mmol/L (ref 3.5–5.1)
Sodium: 139 mmol/L (ref 135–145)
Total Bilirubin: 0.8 mg/dL (ref 0.3–1.2)
Total Protein: 4.9 g/dL — ABNORMAL LOW (ref 6.5–8.1)

## 2021-10-08 LAB — CBC WITH DIFFERENTIAL/PLATELET
Abs Immature Granulocytes: 0.08 10*3/uL — ABNORMAL HIGH (ref 0.00–0.07)
Basophils Absolute: 0 10*3/uL (ref 0.0–0.1)
Basophils Relative: 1 %
Eosinophils Absolute: 0.2 10*3/uL (ref 0.0–0.5)
Eosinophils Relative: 2 %
HCT: 22.7 % — ABNORMAL LOW (ref 39.0–52.0)
Hemoglobin: 7.4 g/dL — ABNORMAL LOW (ref 13.0–17.0)
Immature Granulocytes: 1 %
Lymphocytes Relative: 19 %
Lymphs Abs: 1.3 10*3/uL (ref 0.7–4.0)
MCH: 34.3 pg — ABNORMAL HIGH (ref 26.0–34.0)
MCHC: 32.6 g/dL (ref 30.0–36.0)
MCV: 105.1 fL — ABNORMAL HIGH (ref 80.0–100.0)
Monocytes Absolute: 1 10*3/uL (ref 0.1–1.0)
Monocytes Relative: 15 %
Neutro Abs: 4.3 10*3/uL (ref 1.7–7.7)
Neutrophils Relative %: 62 %
Platelets: 359 10*3/uL (ref 150–400)
RBC: 2.16 MIL/uL — ABNORMAL LOW (ref 4.22–5.81)
RDW: 14.3 % (ref 11.5–15.5)
WBC: 6.8 10*3/uL (ref 4.0–10.5)
nRBC: 0 % (ref 0.0–0.2)

## 2021-10-08 LAB — FOLATE: Folate: 30.7 ng/mL (ref >5.9)

## 2021-10-08 LAB — RETICULOCYTES
Immature Retic Fract: 23.9 % — ABNORMAL HIGH (ref 2.3–15.9)
RBC.: 2.15 MIL/uL — ABNORMAL LOW (ref 4.22–5.81)
Retic Count, Absolute: 88.4 10*3/uL (ref 19.0–186.0)
Retic Ct Pct: 4.1 % — ABNORMAL HIGH (ref 0.4–3.1)

## 2021-10-08 LAB — MAGNESIUM: Magnesium: 1.4 mg/dL — ABNORMAL LOW (ref 1.7–2.4)

## 2021-10-08 LAB — VITAMIN B12: Vitamin B-12: 468 pg/mL (ref 180–914)

## 2021-10-08 LAB — IRON AND TIBC
Iron: 26 ug/dL — ABNORMAL LOW (ref 45–182)
Saturation Ratios: 10 % — ABNORMAL LOW (ref 17.9–39.5)
TIBC: 272 ug/dL (ref 250–450)
UIBC: 246 ug/dL

## 2021-10-08 LAB — FERRITIN: Ferritin: 121 ng/mL (ref 24–336)

## 2021-10-08 LAB — PROCALCITONIN: Procalcitonin: 0.18 ng/mL

## 2021-10-08 LAB — PHOSPHORUS: Phosphorus: 3.2 mg/dL (ref 2.5–4.6)

## 2021-10-08 MED ORDER — POLYVINYL ALCOHOL 1.4 % OP SOLN
1.0000 [drp] | OPHTHALMIC | Status: DC | PRN
  Administered 2021-10-08 – 2021-10-10 (×2): 1 [drp] via OPHTHALMIC
  Filled 2021-10-08 (×2): qty 15

## 2021-10-08 MED ORDER — PANTOPRAZOLE SODIUM 40 MG IV SOLR
40.0000 mg | Freq: Two times a day (BID) | INTRAVENOUS | Status: DC
  Administered 2021-10-08: 40 mg via INTRAVENOUS
  Filled 2021-10-08: qty 10

## 2021-10-08 MED ORDER — MAGNESIUM SULFATE 4 GM/100ML IV SOLN
4.0000 g | Freq: Once | INTRAVENOUS | Status: AC
  Administered 2021-10-08: 4 g via INTRAVENOUS
  Filled 2021-10-08: qty 100

## 2021-10-08 MED ORDER — DIATRIZOATE MEGLUMINE & SODIUM 66-10 % PO SOLN
90.0000 mL | Freq: Once | ORAL | Status: AC
  Administered 2021-10-08: 90 mL via ORAL
  Filled 2021-10-08: qty 90

## 2021-10-08 NOTE — Plan of Care (Signed)
?  Problem: Clinical Measurements: ?Goal: Respiratory complications will improve ?Outcome: Progressing ?  ?Problem: Activity: ?Goal: Risk for activity intolerance will decrease ?Outcome: Progressing ?  ?Problem: Elimination: ?Goal: Will not experience complications related to urinary retention ?Outcome: Progressing ?  ?Problem: Pain Managment: ?Goal: General experience of comfort will improve ?Outcome: Progressing ?  ?Problem: Safety: ?Goal: Ability to remain free from injury will improve ?Outcome: Progressing ?  ?Problem: Skin Integrity: ?Goal: Risk for impaired skin integrity will decrease ?Outcome: Progressing ?  ?

## 2021-10-08 NOTE — Consult Note (Signed)
? ? ? ? ?Consult Note ? ?[place] ?December 07, 1960  ?[id number].   ? ?Requesting MD: Dr. Alfredia Ferguson ?Chief Complaint/Reason for Consult: SBO ? ?HPI:  ?61 yo male with medical history significant for COPD, bipolar 1 disorder, OA, anxiety, alcohol and tobacco use, seizure disorder, PVD, grade 1 diastolic dysfunction, hep C, DDD, diverticulosis who presented to the ED on 4/9 due to abdominal pain that started that day. He had associated nausea and emesis. He described emesis as coffee ground in appearance. Work up in ED significant for partial SBO on CT scan and sepsis secondary to UTI with lactic acid of 3.7. He was admitted to the hospitalist service ? ?Patient recently admitted and discharged from [facility] 4/3-4/7 for syncope, fall, left acetabular fracture. Fracture managed nonop and patient discharged to SNF. He had UTI during that admission and discharged on antibiotics ? ?Since admission he has been NPO. Today he states that he had a good bowel movement and has been passing some flatus. Abdominal distension is improved but not resolved. ? ?Substance use: daily alcohol use, cigarette smoker ?Blood thinners: none  ?Past Surgeries: no prior abdominal surgeries  ? ? ?ROS: ?Review of Systems  ?Constitutional:  Negative for chills and fever.  ?Respiratory:  Positive for cough (chronic, COPD). Negative for shortness of breath.   ?Cardiovascular:  Negative for chest pain and leg swelling.  ?Gastrointestinal:  Positive for abdominal pain, constipation, nausea and vomiting.  ? ?Family History  ?Problem Relation Age of Onset  ? Breast cancer Mother   ? Hypertension Father   ? Diabetes Father   ? ? ?Past Medical History:  ?Diagnosis Date  ? Anxiety   ? Arthritis   ? Asthma   ? Bipolar 1 disorder ([place])   ? Blind right eye   ? Broken hip ([place])   ? Chronic back pain   ? Diverticulosis  ? Chronic headaches   ? Convulsion ([place])   ? once a month when stands up too quickly  ? COPD (chronic obstructive pulmonary disease) ([place])   ? DDD  (degenerative disc disease), lumbar   ? Diverticulitis   ? GERD (gastroesophageal reflux disease)   ? Glaucoma   ? Hepatitis   ? hepatitis C  ? Pneumonia   ? PVD (peripheral vascular disease) ([place])   ? lower extremities reddened and feet swollen  ? Seasonal allergies   ? Shortness of breath dyspnea   ? ? ?Past Surgical History:  ?Procedure Laterality Date  ? EYE SURGERY Right   ? HIP SURGERY Left   ? INTRAMEDULLARY (IM) NAIL INTERTROCHANTERIC Right 01/16/2017  ? Procedure: INTRAMEDULLARY (IM) NAIL INTERTROCHANTRIC;  Surgeon: Corky Mull, MD;  Location: [facility] ORS;  Service: Orthopedics;  Laterality: Right;  ? INTRAMEDULLARY (IM) NAIL INTERTROCHANTERIC Left 03/09/2020  ? Procedure: INTRAMEDULLARY (IM) NAIL INTERTROCHANTRIC;  Surgeon: Thornton Park, MD;  Location: [facility] ORS;  Service: Orthopedics;  Laterality: Left;  ? INTUBATION-ENDOTRACHEAL WITH TRACHEOSTOMY STANDBY N/A 04/22/2015  ? Procedure: INTUBATION-ENDOTRACHEAL WITH TRACHEOSTOMY STANDBY;  Surgeon: Beverly Gust, MD;  Location: [facility] ORS;  Service: ENT;  Laterality: N/A;  ? JOINT REPLACEMENT Left   ? Lt shoulder  ? MANDIBLE SURGERY    ? ? ?Social History:  reports that he has been smoking cigarettes. He has never used smokeless tobacco. He reports current alcohol use of about 3.0 standard drinks per week. He reports that he does not use drugs. ? ?Allergies:  ?Allergies  ?Allergen Reactions  ? Ace Inhibitors Swelling  ?  Angioedema 10/16 requiring intubation @  OSH 2/2 pt reportedly taking someone else's Lisinopril.  ?Angioedema 10/16 requiring intubation @ OSH 2/2 pt reportedly taking someone else's Lisinopril.  ?  ? Lisinopril Other (See Comments)  ?  unknown  ? ? ?Medications Prior to Admission  ?Medication Sig Dispense Refill  ? ADVAIR DISKUS 500-50 MCG/ACT AEPB Inhale 1 puff into the lungs in the morning and at bedtime.    ? albuterol (VENTOLIN HFA) 108 (90 Base) MCG/ACT inhaler Inhale 2 puffs into the lungs every 6 (six) hours as needed for wheezing or  shortness of breath. 8 g 5  ? bisacodyl (DULCOLAX) 5 MG EC tablet Take 1 tablet (5 mg total) by mouth daily as needed for moderate constipation. (Patient taking differently: Take 5 mg by mouth daily.) 30 tablet 0  ? cephALEXin (KEFLEX) 500 MG capsule Take 1 capsule (500 mg total) by mouth every 8 (eight) hours for 4 days. (Patient taking differently: Take 500 mg by mouth every 8 (eight) hours. Start date: 10/04/21) 12 capsule 0  ? cetirizine (ZYRTEC) 10 MG tablet Take 10 mg by mouth daily.    ? diclofenac Sodium (VOLTAREN) 1 % GEL Apply 1 application. topically daily as needed (pain).    ? feeding supplement (ENSURE ENLIVE / ENSURE PLUS) LIQD Take 237 mLs by mouth 2 (two) times daily between meals. 237 mL 12  ? ferrous [id number] C-folic acid (TRINSICON / FOLTRIN) capsule Take 1 capsule by mouth 2 (two) times daily after a meal.    ? fluticasone (FLONASE) 50 MCG/ACT nasal spray Place 2 sprays into both nostrils daily as needed for allergies or rhinitis.    ? gabapentin (NEURONTIN) 300 MG capsule Take 300 mg by mouth 3 (three) times daily.    ? latanoprost (XALATAN) 0.005 % ophthalmic solution Place 1 drop into both eyes at bedtime.    ? Multiple Vitamin (MULTIVITAMIN WITH MINERALS) TABS tablet Take 1 tablet by mouth daily. 30 tablet 0  ? nicotine (NICODERM CQ - DOSED IN MG/24 HOURS) 21 mg/24hr patch Place 1 patch (21 mg total) onto the skin daily. 28 patch 2  ? oxyCODONE (OXY IR/ROXICODONE) 5 MG immediate release tablet Take 1 tablet (5 mg total) by mouth every 4 (four) hours as needed. 30 tablet 0  ? pantoprazole (PROTONIX) 40 MG tablet Take 1 tablet (40 mg total) by mouth daily. 30 tablet 0  ? QUEtiapine (SEROQUEL XR) 300 MG 24 hr tablet Take 150 mg by mouth 2 (two) times daily.    ? thiamine 100 MG tablet Take 1 tablet (100 mg total) by mouth daily. 30 tablet 0  ? Tiotropium Bromide Monohydrate (SPIRIVA RESPIMAT) 2.5 MCG/ACT AERS Inhale 2 puffs into the lungs daily.    ? tiotropium (SPIRIVA HANDIHALER)  18 MCG inhalation capsule Place 1 capsule (18 mcg total) into inhaler and inhale daily. (Patient not taking: Reported on 10/06/2021) 30 capsule 5  ? ? ?Blood pressure 126/78, pulse 88, temperature 98.6 ?F (37 ?C), temperature source Oral, resp. rate 18, height 5\' 11"  (1.803 m), weight 70.3 kg, SpO2 98 %. ?Physical Exam: ?General: pleasant, WD, male who is laying in bed in NAD ?Heart: regular, rate, and rhythm.   ?Lungs: Respiratory effort nonlabored ?Abd: soft, mild distension, +BS, nontender ?Skin: warm and dry  ? ? ?Results for orders placed or performed during the hospital encounter of 10/06/21 (from the past 48 hour(s))  ?Lactic acid, plasma     Status: Abnormal  ? Collection Time: 10/06/21  5:33 PM  ?Result Value Ref Range  ?  Lactic Acid, Venous 3.7 (HH) 0.5 - 1.9 mmol/L  ?  Comment: CRITICAL RESULT CALLED TO, READ BACK BY AND VERIFIED WITH: ?J.DODD,RN 10/06/2021 AT 1900 A.HUGHES ?Performed at [facility], [place] [address] [place], [place] [address] ?  ?Comprehensive metabolic panel     Status: Abnormal  ? Collection Time: 10/06/21  5:33 PM  ?Result Value Ref Range  ? Sodium 134 (L) 135 - 145 mmol/L  ? Potassium 5.0 3.5 - 5.1 mmol/L  ? Chloride 91 (L) 98 - 111 mmol/L  ? CO2 28 22 - 32 mmol/L  ? Glucose, Bld 135 (H) 70 - 99 mg/dL  ?  Comment: Glucose reference range applies only to samples taken after fasting for at least 8 hours.  ? BUN 31 (H) 6 - 20 mg/dL  ? Creatinine, Ser 1.14 0.61 - 1.24 mg/dL  ? Calcium 8.9 8.9 - 10.3 mg/dL  ? Total Protein 6.4 (L) 6.5 - 8.1 g/dL  ? Albumin 3.4 (L) 3.5 - 5.0 g/dL  ? AST 39 15 - 41 U/L  ? ALT 34 0 - 44 U/L  ? Alkaline Phosphatase 135 (H) 38 - 126 U/L  ? Total Bilirubin 0.7 0.3 - 1.2 mg/dL  ? GFR, Estimated >60 >60 mL/min  ?  Comment: (NOTE) ?Calculated using the CKD-EPI Creatinine Equation (2021) ?  ? Anion gap 15 5 - 15  ?  Comment: Performed at [facility], [place] [address] [place], [place] [address]  ?Protime-INR     Status: None  ? Collection Time: 10/06/21   5:33 PM  ?Result Value Ref Range  ? Prothrombin Time 13.2 11.4 - 15.2 seconds  ? INR 1.0 0.8 - 1.2  ?  Comment: (NOTE) ?INR goal varies based on device and disease states. ?Performed at [facility]

## 2021-10-08 NOTE — Consult Note (Addendum)
? ?Referring Provider:  Dr. Raiford Noble ?Primary Care Physician:  [facility] ?Primary Gastroenterologist: Althia Forts ? ?Reason for Consultation: Small bowel obstruction, hematemesis ? ?HPI: Perry Bullock is a 61 y.o. male with a past medical history of anxiety, bipolar disorder, arthritis, asthma, grade 1 diastolic dysfunction, peripheral vascular disease, COPD, chronic headaches, DDD, chronic right hip and back pain, alcohol use disorder, cocaine abuse, questionable hepatitis C, hepatic steatosis, GERD and diverticulosis. ? ?He was admitted to the hospital 09/30/2021 following a syncopal episode which occurred while he was walking resulted in a left hip fracture.  At that time, ethanol levels were < 10 and a urine drug screen was negative for cocaine but was positive for opioids and tricyclic's.  Head CT without acute intracranial abnormality.  Urine cultures were positive for E. coli treated with ceftriaxone then transition to Keflex x 5 days. Imaging of left hip with concern of acute posterior column acetabular fracture. CT left hip which showed a posterior acetabular wall fracture with disruption of the ilioischial line consistent with acute posterior column fracture. Orthopedic surgery was consulted from ED and they recommended conservative management and proceed with physical therapy.  His clinical status improved and he was discharged on to SNF on 10/04/2021. ? ?He felt depressed while at the SNF with associated decreased appetite.  He started vomiting black coffee-ground type emesis x 2 or 3 episodes on Saturday 10/05/2020 and x 2 similar episodes on Sunday. No frank red hematemesis.  He noticed a bulge above the umbilical area which concerned him. He presented to [facility] ED by EMS for further evaluation.  Labs in the ED showed a hemoglobin level of 9.5.  Hematocrit 29.2.  Platelets 427.  Sodium 134.  BUN 31.  Creatinine 1.14.  Alk phos 135.  AST 39.  ALT 34.  Total bili 0.7.   Iron 26.  TIBC 272.  Ferritin 121.  B12 level 468.  CTAP showed a partial small obstruction, the exact transition point was not well-defined but suggested in the RLQ.  He was started on IV fluids, IV Rocephin and Flagyl.  An abdominal x-ray today showed slight improvement and surgical intervention was not warranted.  He was evaluated by general surgeon Dr. Michaelle Birks who thought his CT and symptoms were likely due to reactive ileus and unlikely a true bowel obstruction.  His Hg level dropped Hg 9.5 -> 8.1 -> today hemoglobin 7.4.A GI consult was requested for further evaluation regarding upper GI bleed/hematemesis. ? ?No further nausea or vomiting/hematemesis since admission.  He passed a small solid black stool yesterday and a similar small solid black stool earlier today.  He is passing "a bubble of gas" hourly.  He denies having any abdominal pain.  No heartburn or dysphagia. He underwent an EGD possibly in [facility] which showed stomach ulcers for which he was prescribed Protonix 40 mg daily several years ago.  Further details are unclear. He typically passes a normal formed brown bowel movement most days.  He has intermittent constipation for which she takes Dulcolax once every few weeks.  No rectal bleeding.  He was eating less food since he was transferred to the SNF and had mild constipation likely exacerbated by taking oxycodone several times daily.  He underwent a Cologuard test less than 5 years ago which she reported was negative.  He denies ever having a screening colonoscopy.  He drinks 4-5 beers daily, no alcohol use for the past 2-1/2 weeks.  No cocaine use  for several years. ? ? ?RUQ sonogram 10/01/2021: ?1. Hepatic steatosis and hepatomegaly. ?2. No definitive contour nodularity to confirm cirrhosis. ? ?Chest x-ray 10/06/2021: ?Chronic pleuroparenchymal disease without acute cardiopulmonary ?findings ? ?CTAP 10/06/2021: ? 1. Partial small bowel obstruction, exact transition point not ?defined, but  suggested in the right lower quadrant. No other ?convincing acute abnormality. ?2. Compression fractures of T10 and T11 that are new from the prior ?CT, but likely chronic. ?3. Hepatic steatosis. ?4. Aortic atherosclerosis. ?  ?Echo 10/03/2021: ?Left ventricular ejection fraction, by estimation, is 60 to 65%. The left ventricle has ?normal function. The left ventricle has no regional wall motion abnormalities. Left ?ventricular diastolic parameters are consistent with Grade I diastolic dysfunction ?(impaired relaxation). ?1. ?Right ventricular systolic function is normal. The right ventricular size is normal. ?There is normal pulmonary artery systolic pressure. The estimated right ventricular ?systolic pressure is 63.7 mmHg. ?2. ?The mitral valve is normal in structure. No evidence of mitral valve regurgitation. No ?evidence of mitral stenosis. ?3. ?The aortic valve is normal in structure. Aortic valve regurgitation is not visualized. No ?aortic stenosis is present. ?4. ?The inferior vena cava is normal in size with greater than 50% respiratory variability, ?suggesting right atrial pressure of 3 mmHg ? ? ?Past Medical History:  ?Diagnosis Date  ? Anxiety   ? Arthritis   ? Asthma   ? Bipolar 1 disorder ([place])   ? Blind right eye   ? Broken hip ([place])   ? Chronic back pain   ? Diverticulosis  ? Chronic headaches   ? Convulsion ([place])   ? once a month when stands up too quickly  ? COPD (chronic obstructive pulmonary disease) ([place])   ? DDD (degenerative disc disease), lumbar   ? Diverticulitis   ? GERD (gastroesophageal reflux disease)   ? Glaucoma   ? Hepatitis   ? hepatitis C  ? Pneumonia   ? PVD (peripheral vascular disease) ([place])   ? lower extremities reddened and feet swollen  ? Seasonal allergies   ? Shortness of breath dyspnea   ? ? ?Past Surgical History:  ?Procedure Laterality Date  ? EYE SURGERY Right   ? HIP SURGERY Left   ? INTRAMEDULLARY (IM) NAIL INTERTROCHANTERIC Right 01/16/2017  ? Procedure: INTRAMEDULLARY  (IM) NAIL INTERTROCHANTRIC;  Surgeon: Corky Mull, MD;  Location: [facility] ORS;  Service: Orthopedics;  Laterality: Right;  ? INTRAMEDULLARY (IM) NAIL INTERTROCHANTERIC Left 03/09/2020  ? Procedure: INTRAMEDULLARY (IM) NAIL INTERTROCHANTRIC;  Surgeon: Thornton Park, MD;  Location: [facility] ORS;  Service: Orthopedics;  Laterality: Left;  ? INTUBATION-ENDOTRACHEAL WITH TRACHEOSTOMY STANDBY N/A 04/22/2015  ? Procedure: INTUBATION-ENDOTRACHEAL WITH TRACHEOSTOMY STANDBY;  Surgeon: Beverly Gust, MD;  Location: [facility] ORS;  Service: ENT;  Laterality: N/A;  ? JOINT REPLACEMENT Left   ? Lt shoulder  ? MANDIBLE SURGERY    ? ? ?Prior to Admission medications   ?Medication Sig Start Date End Date Taking? Authorizing Provider  ?ADVAIR DISKUS 500-50 MCG/ACT AEPB Inhale 1 puff into the lungs in the morning and at bedtime. 07/27/21  Yes [provider]  ?albuterol (VENTOLIN HFA) 108 (90 Base) MCG/ACT inhaler Inhale 2 puffs into the lungs every 6 (six) hours as needed for wheezing or shortness of breath. 01/21/21  Yes Allyne Gee, MD  ?bisacodyl (DULCOLAX) 5 MG EC tablet Take 1 tablet (5 mg total) by mouth daily as needed for moderate constipation. ?Patient taking differently: Take 5 mg by mouth daily. 10/04/21  Yes Lorella Nimrod, MD  ?cephALEXin (KEFLEX) 500 MG  capsule Take 1 capsule (500 mg total) by mouth every 8 (eight) hours for 4 days. ?Patient taking differently: Take 500 mg by mouth every 8 (eight) hours. Start date: 10/04/21 10/04/21 10/08/21 Yes Lorella Nimrod, MD  ?cetirizine (ZYRTEC) 10 MG tablet Take 10 mg by mouth daily.   Yes [provider]  ?diclofenac Sodium (VOLTAREN) 1 % GEL Apply 1 application. topically daily as needed (pain).   Yes [provider]  ?feeding supplement (ENSURE ENLIVE / ENSURE PLUS) LIQD Take 237 mLs by mouth 2 (two) times daily between meals. 10/04/21  Yes Lorella Nimrod, MD  ?ferrous [id number] C-folic acid (TRINSICON / FOLTRIN) capsule Take 1 capsule by mouth 2 (two)  times daily after a meal. 10/04/21  Yes Lorella Nimrod, MD  ?fluticasone (FLONASE) 50 MCG/ACT nasal spray Place 2 sprays into both nostrils daily as needed for allergies or rhinitis.   Yes Provider, Historic

## 2021-10-08 NOTE — Evaluation (Addendum)
Occupational Therapy Evaluation ?Patient Details ?Name: Perry Bullock ?MRN: [id number] ?DOB: 09-02-1960 ?Today's Date: 10/08/2021 ? ? ?History of Present Illness Pt is a 61 y/o M presenting to ED on 4/9 from SNF [facility]) with abdominal pain. Found to have SBO. Recently hospitalized at [facility] from 4/3-4/7 for L hip fx. PMH includes bipolar 1 disorder, blind R eye, GERD, Hep C, and PVD.  ? ?Clinical Impression ?  ?Pt reports needing assist at baseline for ADLs and functional mobility, reports using cane prior to L hip fx and was not bathing due to depressive episode, required assistance from significant other for LB ADLs. Prior to SNF, pt lives with significant other who is able to assist. Pt reports he had not yet seen therapy while at SNF. Pt currently min-max A for ADLs, adheres well to WB precautions throughout session, needing frequent (3-4) standing rest breaks with short distance/in-room mobility using RW with min guard A.  VSS on 2-3L O2 throughout session. Pt presenting with impairments listed below, will follow acutely. Recommend HHOT at d/c pending pt progress.  ? ?Recommendations for follow up therapy are one component of a multi-disciplinary discharge planning process, led by the attending physician.  Recommendations may be updated based on patient status, additional functional criteria and insurance authorization.  ? ?Follow Up Recommendations ? Home health OT  ?  ?Assistance Recommended at Discharge Frequent or constant Supervision/Assistance  ?Patient can return home with the following A little help with walking and/or transfers;A lot of help with bathing/dressing/bathroom;Assistance with cooking/housework;Assist for transportation;Help with stairs or ramp for entrance ? ?  ?Functional Status Assessment ? Patient has had a recent decline in their functional status and demonstrates the ability to make significant improvements in function in a reasonable and predictable amount of time.  ?Equipment  Recommendations ? BSC/3in1  ?  ?Recommendations for Other Services PT consult ? ? ?  ?Precautions / Restrictions Precautions ?Precautions: Fall ?Restrictions ?Weight Bearing Restrictions: Yes ?LLE Weight Bearing: Touchdown weight bearing ?Other Position/Activity Restrictions: TTWB per MD note  ? ?  ? ?Mobility Bed Mobility ?Overal bed mobility: Needs Assistance ?Bed Mobility: Supine to Sit ?  ?  ?Supine to sit: Supervision ?  ?  ?General bed mobility comments: use of bedrails ?  ? ?Transfers ?Overall transfer level: Needs assistance ?Equipment used: Rolling walker (2 wheels) ?Transfers: Sit to/from Stand ?Sit to Stand: Min guard ?  ?  ?  ?  ?  ?General transfer comment: adheres well to WB precautions throughout session ?  ? ?  ?Balance Overall balance assessment: Needs assistance ?Sitting-balance support: No upper extremity supported ?Sitting balance-Leahy Scale: Normal ?  ?  ?Standing balance support: Single extremity supported ?Standing balance-Leahy Scale: Fair ?  ?  ?  ?  ?  ?  ?  ?  ?  ?  ?  ?  ?   ? ?ADL either performed or assessed with clinical judgement  ? ?ADL Overall ADL's : Needs assistance/impaired ?Eating/Feeding: Set up;Sitting ?  ?Grooming: Set up;Sitting;Standing ?  ?Upper Body Bathing: Minimal assistance;Sitting ?  ?Lower Body Bathing: Moderate assistance;Sitting/lateral leans ?  ?Upper Body Dressing : Minimal assistance;Sitting ?  ?Lower Body Dressing: Maximal assistance;Bed level ?Lower Body Dressing Details (indicate cue type and reason): to don/doff socks ?Toilet Transfer: Min guard;Rolling walker (2 wheels);Ambulation;Regular Toilet ?  ?Toileting- Clothing Manipulation and Hygiene: Supervision/safety;Sitting/lateral lean ?Toileting - Clothing Manipulation Details (indicate cue type and reason): for pericare/clothing mgmt ?  ?  ?Functional mobility during ADLs: Min guard;Rolling walker (  2 wheels) ?   ? ? ? ?Vision Patient Visual Report: Other (comment);No change from baseline (R eye blindness  at baseline) ?Vision Assessment?: No apparent visual deficits ?Additional Comments: vision at baseline  ?   ?Perception   ?  ?Praxis   ?  ? ?Pertinent Vitals/Pain Pain Assessment ?Pain Assessment: Faces ?Pain Score: 3  ?Faces Pain Scale: Hurts a little bit ?Pain Location: left hip ?Pain Descriptors / Indicators: Sore ?Pain Intervention(s): Limited activity within patient's tolerance, Monitored during session, Repositioned  ? ? ? ?Hand Dominance Right ?  ?Extremity/Trunk Assessment Upper Extremity Assessment ?Upper Extremity Assessment: Overall WFL for tasks assessed (LUE with bruising from previous fall) ?  ?Lower Extremity Assessment ?Lower Extremity Assessment: Defer to PT evaluation ?  ?Cervical / Trunk Assessment ?Cervical / Trunk Assessment: Normal ?  ?Communication Communication ?Communication: No difficulties ?  ?Cognition Arousal/Alertness: Awake/alert ?Behavior During Therapy: [facility] Group LLCWFL for tasks assessed/performed ?Overall Cognitive Status: Within Functional Limits for tasks assessed ?  ?  ?  ?  ?  ?  ?  ?  ?  ?  ?  ?  ?  ?  ?  ?  ?  ?  ?  ?General Comments  HR in 120's with mobility, SpO2 95% and above on 2-3L O2 during session. Pt needing 3-4 standing rest breaks with mobility ? ?  ?Exercises   ?  ?Shoulder Instructions    ? ? ?Home Living Family/patient expects to be discharged to:: Private residence ?Living Arrangements: Spouse/significant other ?Available Help at Discharge: Family;Available 24 hours/day ?Type of Home: House ?Home Access: Stairs to enter ?Entrance Stairs-Number of Steps: 3 ?Entrance Stairs-Rails: Right;Left ?Home Layout: One level ?  ?  ?Bathroom Shower/Tub: Tub/shower unit ?  ?Bathroom Toilet: Standard ?Bathroom Accessibility: Yes ?  ?Home Equipment: BSC/3in1;Rolling Walker (2 wheels);Cane - single point;Cane - quad;Grab bars - tub/shower;Other (comment);Rollator (4 wheels) ?  ?Additional Comments: [facility], states he was there 2-3 days before coming to hospital. Home  living info taken from chart review [facility] ?  ? ?  ?Prior Functioning/Environment Prior Level of Function : Needs assist ?  ?  ?  ?  ?  ?  ?Mobility Comments: reports using cane prior to L hip fx, has not yet had therapy at SNF ?ADLs Comments: reports not showering recently due to depressive episode prior to fall, reports showering seated ?  ? ?  ?  ?OT Problem List: Decreased strength;Pain;Impaired balance (sitting and/or standing);Decreased knowledge of use of DME or AE;Decreased range of motion;Decreased activity tolerance;Decreased safety awareness;Cardiopulmonary status limiting activity ?  ?   ?OT Treatment/Interventions: Self-care/ADL training;Therapeutic exercise;DME and/or AE instruction;Energy conservation;Therapeutic activities;Cognitive remediation/compensation;Patient/family education  ?  ?OT Goals(Current goals can be found in the care plan section) Acute Rehab OT Goals ?Patient Stated Goal: to get better ?OT Goal Formulation: With patient ?Time For Goal Achievement: 10/22/21 ?Potential to Achieve Goals: Good ?ADL Goals ?Pt Will Perform Grooming: with supervision;standing;sitting ?Pt Will Perform Upper Body Dressing: with supervision;sitting ?Pt Will Perform Lower Body Dressing: with min assist;sit to/from stand;sitting/lateral leans;with adaptive equipment ?Pt Will Transfer to Toilet: with supervision;regular height toilet;ambulating ?Pt Will Perform Tub/Shower Transfer: Shower transfer;Tub transfer;ambulating;shower seat;with min assist  ?OT Frequency: Min 3X/week ?  ? ?Co-evaluation   ?  ?  ?  ?  ? ?  ?AM-PAC OT "6 Clicks" Daily Activity     ?Outcome Measure Help from another person eating meals?: None ?Help from another person taking care of personal  grooming?: A Little ?Help from another person toileting, which includes using toliet, bedpan, or urinal?: A Little ?Help from another person bathing (including washing, rinsing, drying)?: A Lot ?Help from another person to put  on and taking off regular upper body clothing?: A Little ?Help from another person to put on and taking off regular lower body clothing?: A Lot ?6 Click Score: 17 ?  ?End of Session   ? ?Activity Tolerance:
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
## 2021-10-08 NOTE — Evaluation (Signed)
Physical Therapy Evaluation ?Patient Details ?Name: Perry Bullock ?MRN: [id number] ?DOB: 02-14-1961 ?Today's Date: 10/08/2021 ? ?History of Present Illness ? Pt is a 61 y.o. male admitted from SNF 10/06/21 with generalized abdominal pain. Workup revealed partial SBO; T8, T10-11 compression fxs, likely chronic. Of note, recent admissions at [facility] 4/3-10/04/21 for syncope with fall, non-operative L acetabular fx with d/c to SNF. Other PMH includes blind R eye, bipolar 1 disorder, gluacoma, Hep C, COPD, DDD, PVD, chronic back pain, asthma, anxiety, arthritis. ?  ?Clinical Impression ? Pt presents with an overall decrease in functional mobility secondary to above. PTA, pt typically mod indep with SPC, lives with significant other; had recently d/c to SNF for 3 days before readmission, but had not begun with therapies yet. Today, pt moving well with RW and intermittent min guard for balance; cues for LLE TDWB precautions. Pt reports preference for return home, will have necessary assist and DME. Will follow acutely to address established goals. ?  ?HR 135 with ambulation ?SpO2 95% on RA  ? ?Recommendations for follow up therapy are one component of a multi-disciplinary discharge planning process, led by the attending physician.  Recommendations may be updated based on patient status, additional functional criteria and insurance authorization. ? ?Follow Up Recommendations Home health PT ? ?  ?Assistance Recommended at Discharge Intermittent Supervision/Assistance  ?Patient can return home with the following ? A little help with walking and/or transfers;A little help with bathing/dressing/bathroom;Assistance with cooking/housework;Assist for transportation;Help with stairs or ramp for entrance ? ?  ?Equipment Recommendations None recommended by PT  ?Recommendations for Other Services ?    ?  ?Functional Status Assessment Patient has had a recent decline in their functional status and demonstrates the ability to make significant  improvements in function in a reasonable and predictable amount of time.  ? ?  ?Precautions / Restrictions Precautions ?Precautions: Fall ?Restrictions ?Weight Bearing Restrictions: Yes ?LLE Weight Bearing: Touchdown weight bearing ?Other Position/Activity Restrictions: Recent L acetabular fx (non-operative management)  ? ?  ? ?Mobility ? Bed Mobility ?  ?  ?  ?  ?  ?  ?  ?General bed mobility comments: Received sitting on toilet ?  ? ?Transfers ?Overall transfer level: Needs assistance ?Equipment used: Rolling walker (2 wheels) ?Transfers: Sit to/from Stand ?Sit to Stand: Min guard ?  ?  ?  ?  ?  ?General transfer comment: cues for use of grab bar to pull to stand; increased time and effort, min guard for balance ?  ? ?Ambulation/Gait ?Ambulation/Gait assistance: Min guard ?Gait Distance (Feet): 14 Feet ?Assistive device: Rolling walker (2 wheels) ?Gait Pattern/deviations: Step-to pattern ?Gait velocity: Decreased ?Gait velocity interpretation: <1.31 ft/sec, indicative of household ambulator ?  ?General Gait Details: Slow, fatigued gait with RW and intermittent min guard for balance; verbal cues for LLE TDWB precautions when pt appears to start putting increased weight through LLE; pt endorses fatigue after using bathroom for ~1 hr, 2x standing rest breaks ambulating short distance ? ?Stairs ?  ?  ?  ?  ?  ? ?Wheelchair Mobility ?  ? ?Modified Rankin (Stroke Patients Only) ?  ? ?  ? ?Balance Overall balance assessment: Needs assistance ?Sitting-balance support: No upper extremity supported ?Sitting balance-Leahy Scale: Good ?Sitting balance - Comments: indep with posterior pericare sitting on toilet ?  ?Standing balance support: Single extremity supported ?Standing balance-Leahy Scale: Poor ?Standing balance comment: reliant on RW; able to don pants after using bathroom with single UE support ?  ?  ?  ?  ?  ?  ?  ?  ?  ?  ?  ?   ? ? ? ?  Pertinent Vitals/Pain Pain Assessment ?Pain Assessment: Faces ?Faces Pain Scale:  Hurts little more ?Pain Location: left hip ?Pain Descriptors / Indicators: Sore ?Pain Intervention(s): Monitored during session, Limited activity within patient's tolerance  ? ? ?Home Living Family/patient expects to be discharged to:: Private residence ?Living Arrangements: Spouse/significant other ?Available Help at Discharge: Family;Available 24 hours/day ?Type of Home: House ?Home Access: Stairs to enter ?Entrance Stairs-Rails: Right;Left ?Entrance Stairs-Number of Steps: 3 ?  ?Home Layout: One level ?Home Equipment: BSC/3in1;Rolling Walker (2 wheels);Cane - single point;Cane - quad;Grab bars - tub/shower;Rollator (4 wheels) ?Additional Comments: Came from [facility], states he was there 2-3 days before coming to hospital. Pt hopeful for return home instead of rehab. Reports he can put [facility] over toilet; reports wheelchair likely won't fit well  ?  ?Prior Function Prior Level of Function : Needs assist ?  ?  ?  ?  ?  ?  ?Mobility Comments: Typically mod indep with SPC; since L hip fx, had not had chance to start with therapies at SNF yet ?ADLs Comments: Typically sits to shower at home; significant other assists with household tasks as needed ?  ? ? ?Hand Dominance  ? Dominant Hand: Right ? ?  ?Extremity/Trunk Assessment  ? Upper Extremity Assessment ?Upper Extremity Assessment: Overall WFL for tasks assessed ?  ? ?Lower Extremity Assessment ?Lower Extremity Assessment: LLE deficits/detail ?LLE Deficits / Details: non-operative L hip fx (09/30/21); hip and knee functionally at least 3/5 ?  ? ?   ?Communication  ? Communication: HOH  ?Cognition Arousal/Alertness: Awake/alert ?Behavior During Therapy: [facility] for tasks assessed/performed ?Overall Cognitive Status: Within Functional Limits for tasks assessed ?  ?  ?  ?  ?  ?  ?  ?  ?  ?  ?  ?  ?  ?  ?  ?  ?General Comments: WFL for simple tasks; requires some redirection in conversation; good ability to problem solve through safe mobility at home ?  ?  ? ?  ?General  Comments General comments (skin integrity, edema, etc.): HR up to 135, SpO2 95% on RA. Pt with preference for return home - increased time discussing DME needs, available assist, fall risk reduction. Pt reports having bowel movement prior to session, had already flushed toilet (RN and PA notified) ? ?  ?Exercises    ? ?Assessment/Plan  ?  ?PT Assessment Patient needs continued PT services  ?PT Problem List Decreased strength;Decreased activity tolerance;Decreased balance;Decreased mobility;Decreased knowledge of use of DME;Pain;Decreased knowledge of precautions ? ?   ?  ?PT Treatment Interventions DME instruction;Gait training;Stair training;Functional mobility training;Therapeutic activities;Therapeutic exercise;Balance training;Patient/family education   ? ?PT Goals (Current goals can be found in the Care Plan section)  ?  ? ?  ?Frequency Min 5X/week ?  ? ? ?Co-evaluation   ?  ?  ?  ?  ? ? ?  ?AM-PAC PT "6 Clicks" Mobility  ?Outcome Measure Help needed turning from your back to your side while in a flat bed without using bedrails?: A Little ?Help needed moving from lying on your back to sitting on the side of a flat bed without using bedrails?: A Little ?Help needed moving to and from a bed to a chair (including a wheelchair)?: A Little ?Help needed standing up from a chair using your arms (e.g., wheelchair or bedside chair)?: A Little ?Help needed to walk in hospital room?: A Little ?Help needed climbing 3-5 steps with a railing? : A Lot ?6 Click Score: 17 ? ?  ?  End of Session Equipment Utilized During Treatment: Gait belt ?Activity Tolerance: Patient tolerated treatment well ?Patient left: in chair;with call bell/phone within reach;with chair alarm set ?Nurse Communication: Mobility status ?PT Visit Diagnosis: Other abnormalities of gait and mobility (R26.89);Muscle weakness (generalized) (M62.81);Pain ?Pain - Right/Left: Left ?Pain - part of body: Hip ?  ? ?Time: 8469-6295 ?PT Time Calculation (min) (ACUTE  ONLY): 14 min ? ? ?Charges:   PT Evaluation ?$PT Eval Low Complexity: 1 Low ?  ?  ?Ina Homes, PT, DPT ?Acute Rehabilitation Services  ?Pager [phone] ?Office [phone] ? ?Malachy Chamber ?4/

## 2021-10-08 NOTE — Progress Notes (Signed)
?PROGRESS NOTE ? ? ? Perry Bullock  BEE:[id number] DOB: [birth year] DOA: 10/06/2021 ?PCP: [facility]  ? ?Brief Narrative:  ?HPI per Dr. Eugenie Norrie ?Perry Bullock is a 61 y.o. male with medical history significant for COPD, asthma, bipolar 1 disorder, osteoarthritis, anxiety, polysubstance abuse including tobacco and alcohol, seizure disorder, PVD, grade 1 diastolic dysfunction hepatitis C, DDD and chronic headaches as well as diverticulosis, who presented to the ER with acute onset of generalized abdominal pain for the last day, that is moderate in intensity and has been worsening with associated recurrent nausea and vomiting.  He denies any fever or chills.  His abdomen has been distended despite vomiting.  He stated that he saw occasional black and coffee-ground emesis.  Nothing was witnessed yet in the ER.  She admitted to chronic cough with his COPD without any worsening.  No chest pain or dyspnea or palpitations.  No headache or dizziness or blurred vision.  No presyncope or syncope.  No dysuria, oliguria or hematuria or flank pain.  No bleeding diathesis. ? ?The patient was just admitted at [facility] from 4/3 till 4/7 for syncope and fall with subsequent left acetabular fracture that was nonoperative for which she went to the SNF.  He had a UTI with E. coli that was resistant to ampicillin and Bactrim and Cipro sensitive to ceftriaxone with MIC less than 0.25 which was transitioned to Keflex to complete 5-day course. ? ?ED Course: When he came to the ER heart rate was 108 and respiratory to 16 and later 21 then 23.  Labs revealed mild hyponatremia and borderline potassium of 5, hypochloremia, BUN of 31 with a creatinine of 1.14 and alk phos 135 with albumin of 3.4 and total protein 6.4.  Lactic acid was 3.7.  CBC showed anemia better than previous levels and thrombocytopenia ?EKG as reviewed by me : EKG showed sinus tachycardia with rate of 124 with right bundle branch block and T wave inversion  anteroseptally with Q waves inferiorly. ?Imaging: Portable chest x-ray showed chronic pleural-parenchymal disease without acute cardiopulmonary findings. ? ?Abdominal pelvic CT scan revealed partial small bowel obstruction with no exact transition point but suggested in the right lower quadrant, compression fractures of T10 and T8 11 that are new from the prior CT but likely chronic, hepatic steatosis, and aortic atherosclerosis. ? ?The patient was given 50 mcg of IV fentanyl, 1 L bolus of IV lactated Ringer, IV Rocephin and Flagyl initially and was then placed on 150 mL/h of IV lactated Ringer.  He will be admitted to a medical telemetry bed for further evaluation and management. ? ?**Interim History ?States that he had a bowel movement but was not very big and again  Continues to have some urinary symptoms.  Also complains some abdominal discomfort and pain as well as some distention. Two view Abd X-Ray showed "Moderately dilated small bowel loops with gas in colon, compatible ?with partial small bowel obstruction seen on recent CT ?abdomen/pelvis."  Patient was also complained about some coffee-ground emesis but has had none since.  His sepsis physiology is improved and repeat KUB shows slight improvement in his partial small bowel obstruction.  General surgery was consulted for further evaluation recommendations and they are recommending continue supportive care and the general surgery team feels that his symptoms are due to a reactive ileus rather than a true obstruction but recommending clear liquid diet and IV fluid hydration with holding of his NG tube placement with the caveat of that  he continues to have some nausea vomiting they will place an NGT with a small bowel protocol orally.  GI evaluated and given his coffee-ground emesis they are planning on EGD in the a.m.  Recommended PPI IV twice daily and turning every 2 hours in bed. ?   ? ?Assessment and Plan: ?* SBO (small bowel obstruction) ([place]) ?Vs.  Ileus  ?- The patient will be admitted to a medical telemetry bed for ?- We will keep him n.p.o. for now and advanced diet to CLD  ?- He will be hydrated with IV normal saline. ?-States he has been vomiting black vomitus and states it appeared a little coffee ground when dried so GI was notified  ?-C/w IVF Hydration at 125 mL/hr  ?-Keep K+ above 4 and Mag Level > 2 ?-Patient's Hgb/HCt went from 9.5/29.2 -> 8.1/25.3 -> 7.4/22.7 ?-Consulted general surgery and they feel that he does not have a true small bowel obstruction he likely has a reactive ileus and they are recommending continue to monitor closely ?-KUB done and showed "Slight improvement in the degree of small-bowel distention." ?-We will hold off placing NG tube as he has a soft abdomen is not nauseous or vomiting ? ?Sepsis due to gram-negative UTI ([place]) ?- This was manifested by tachycardia and tachypnea and suspected source of Infection n the Urine ?- The patient will be placed on IV Rocephin. ?-Urinalysis done showed a hazy appearance with large leukocytes, negative nitrites, few bacteria, greater than 50 WBCs and blood cultures show NGTD at 2 Days ?-CT of the abdomen pelvis done with contrast showed "Partial small bowel obstruction, exact  transition point not defined, but suggested in the right lower quadrant. No other convincing acute abnormality. Compression fractures of T10 and T11 that are new from the prior CT, but likely chronic. Hepatic steatosis. Aortic atherosclerosis." ? - We will follow blood and urine cultures  ?-Urine Cx Showed No Growth  ?-Since Urine is No Growth will likely stop Abx after 5 Days ? ?Acetabular fracture ([place]) ?-This is a recent previous problem for which the patient was admitted to rehab. ?- Pain management will be provided and we will continue morphine IV 2 mg every 4 as needed for moderate and severe pain as well as p.o. oxycodone 5 mg every 4 as needed for moderate-severe pain as breakthrough ? ?Severe sepsis  ([place]) ?-This wasmanifested by sepsis and lactic acid of 3.7. Now LA is trending down and went to 1.4 -> 1.6  ?- He will be aggressively hydrated with IV normal saline and continuing NS at 125 mL/hr ?- Management otherwise as above. ? ?COPD (chronic obstructive pulmonary disease) ([place]) ?- We will continue his inhalers with [facility] and patient has albuterol as needed ? ?Tobacco Abuse ?-Smoking cessation counseling given and will continue with nicotine 21 mg transdermal patch q. 20 ? ?Macrocytic anemia ?Concern for an Upper GI bleed ?-Patient's Hgb/Hct went from 9.5/29.2 -> 8.1/25.3 and is further dropped to 7.4/22.7 with an MCV of 105.1 ?-Likely dilutional Drop with IV fluid resuscitation but because he had some "coffee-ground emesis" GI has been consulted and are planning EGD ?-Check FOBT as he had some "coffee ground" emesis and a cystoscopy pending to be done but he did have a black stool noted ?-Anemia panel done and showed an iron level of 26, TIBC 246, TIBC 272, saturation ratios of 10%, ferritin level 121, folate level of 30.7 and vitamin B12 of 468 ?-Patient has been taking ferrous fumarate [id number] C-folic acid capsule p.o. twice  daily ?-We will hold off his pharmacological VTE prophylaxis and discontinue his enoxaparin for now given his drop in hemoglobin ?-Patient is on a PPI IV twice daily ?-Continue to monitor for signs and symptoms of bleeding; no overt bleeding noted ?-Repeat CBC in a.m and appreciate GI evaluation recommendations and they are planning for an EGD in the a.m. ? ?Seizure disorder ([place]) ?- We will continue his Neurontin. ? ?Bipolar 1 disorder ([place]) ?- We will continue his Seroquel 150 mg p.o. twice daily and trazodone 25 mg p.o. nightly for sleep ? ? ?DVT prophylaxis:  ? ?  Code Status: Full Code ?Family Communication: No family present at bedside  ? ?Disposition Plan:  ?Level of care: Telemetry Medical ?Status is: Inpatient ?Remains inpatient appropriate because: Needs recovery of his  Bowel function and rule out for Upper GIB ?  ?Consultants:  ?General Surgery ?Gastroenterology  ? ?Procedures:  ?None but EGD in the AM  ? ?Antimicrobials:  ?Anti-infectives (From admission, onward)  ? ? Start

## 2021-10-08 NOTE — Progress Notes (Signed)
Perry Bullock, in radiology aware of oral gastrografin admin at 1715. ?

## 2021-10-08 NOTE — H&P (View-Only) (Signed)
? ?Referring Provider:  Dr. Raiford Noble ?Primary Care Physician:  [facility] ?Primary Gastroenterologist: Althia Forts ? ?Reason for Consultation: Small bowel obstruction, hematemesis ? ?HPI: Perry Bullock is a 61 y.o. male with a past medical history of anxiety, bipolar disorder, arthritis, asthma, grade 1 diastolic dysfunction, peripheral vascular disease, COPD, chronic headaches, DDD, chronic right hip and back pain, alcohol use disorder, cocaine abuse, questionable hepatitis C, hepatic steatosis, GERD and diverticulosis. ? ?He was admitted to the hospital 09/30/2021 following a syncopal episode which occurred while he was walking resulted in a left hip fracture.  At that time, ethanol levels were < 10 and a urine drug screen was negative for cocaine but was positive for opioids and tricyclic's.  Head CT without acute intracranial abnormality.  Urine cultures were positive for E. coli treated with ceftriaxone then transition to Keflex x 5 days. Imaging of left hip with concern of acute posterior column acetabular fracture. CT left hip which showed a posterior acetabular wall fracture with disruption of the ilioischial line consistent with acute posterior column fracture. Orthopedic surgery was consulted from ED and they recommended conservative management and proceed with physical therapy.  His clinical status improved and he was discharged on to SNF on 10/04/2021. ? ?He felt depressed while at the SNF with associated decreased appetite.  He started vomiting black coffee-ground type emesis x 2 or 3 episodes on Saturday 10/05/2020 and x 2 similar episodes on Sunday. No frank red hematemesis.  He noticed a bulge above the umbilical area which concerned him. He presented to [facility] ED by EMS for further evaluation.  Labs in the ED showed a hemoglobin level of 9.5.  Hematocrit 29.2.  Platelets 427.  Sodium 134.  BUN 31.  Creatinine 1.14.  Alk phos 135.  AST 39.  ALT 34.  Total bili 0.7.   Iron 26.  TIBC 272.  Ferritin 121.  B12 level 468.  CTAP showed a partial small obstruction, the exact transition point was not well-defined but suggested in the RLQ.  He was started on IV fluids, IV Rocephin and Flagyl.  An abdominal x-ray today showed slight improvement and surgical intervention was not warranted.  He was evaluated by general surgeon Dr. Michaelle Birks who thought his CT and symptoms were likely due to reactive ileus and unlikely a true bowel obstruction.  His Hg level dropped Hg 9.5 -> 8.1 -> today hemoglobin 7.4.A GI consult was requested for further evaluation regarding upper GI bleed/hematemesis. ? ?No further nausea or vomiting/hematemesis since admission.  He passed a small solid black stool yesterday and a similar small solid black stool earlier today.  He is passing "a bubble of gas" hourly.  He denies having any abdominal pain.  No heartburn or dysphagia. He underwent an EGD possibly in [facility] which showed stomach ulcers for which he was prescribed Protonix 40 mg daily several years ago.  Further details are unclear. He typically passes a normal formed brown bowel movement most days.  He has intermittent constipation for which she takes Dulcolax once every few weeks.  No rectal bleeding.  He was eating less food since he was transferred to the SNF and had mild constipation likely exacerbated by taking oxycodone several times daily.  He underwent a Cologuard test less than 5 years ago which she reported was negative.  He denies ever having a screening colonoscopy.  He drinks 4-5 beers daily, no alcohol use for the past 2-1/2 weeks.  No cocaine use  for several years. ? ? ?RUQ sonogram 10/01/2021: ?1. Hepatic steatosis and hepatomegaly. ?2. No definitive contour nodularity to confirm cirrhosis. ? ?Chest x-ray 10/06/2021: ?Chronic pleuroparenchymal disease without acute cardiopulmonary ?findings ? ?CTAP 10/06/2021: ? 1. Partial small bowel obstruction, exact transition point not ?defined, but  suggested in the right lower quadrant. No other ?convincing acute abnormality. ?2. Compression fractures of T10 and T11 that are new from the prior ?CT, but likely chronic. ?3. Hepatic steatosis. ?4. Aortic atherosclerosis. ?  ?Echo 10/03/2021: ?Left ventricular ejection fraction, by estimation, is 60 to 65%. The left ventricle has ?normal function. The left ventricle has no regional wall motion abnormalities. Left ?ventricular diastolic parameters are consistent with Grade I diastolic dysfunction ?(impaired relaxation). ?1. ?Right ventricular systolic function is normal. The right ventricular size is normal. ?There is normal pulmonary artery systolic pressure. The estimated right ventricular ?systolic pressure is 35.3 mmHg. ?2. ?The mitral valve is normal in structure. No evidence of mitral valve regurgitation. No ?evidence of mitral stenosis. ?3. ?The aortic valve is normal in structure. Aortic valve regurgitation is not visualized. No ?aortic stenosis is present. ?4. ?The inferior vena cava is normal in size with greater than 50% respiratory variability, ?suggesting right atrial pressure of 3 mmHg ? ? ?Past Medical History:  ?Diagnosis Date  ? Anxiety   ? Arthritis   ? Asthma   ? Bipolar 1 disorder ([place])   ? Blind right eye   ? Broken hip ([place])   ? Chronic back pain   ? Diverticulosis  ? Chronic headaches   ? Convulsion ([place])   ? once a month when stands up too quickly  ? COPD (chronic obstructive pulmonary disease) ([place])   ? DDD (degenerative disc disease), lumbar   ? Diverticulitis   ? GERD (gastroesophageal reflux disease)   ? Glaucoma   ? Hepatitis   ? hepatitis C  ? Pneumonia   ? PVD (peripheral vascular disease) ([place])   ? lower extremities reddened and feet swollen  ? Seasonal allergies   ? Shortness of breath dyspnea   ? ? ?Past Surgical History:  ?Procedure Laterality Date  ? EYE SURGERY Right   ? HIP SURGERY Left   ? INTRAMEDULLARY (IM) NAIL INTERTROCHANTERIC Right 01/16/2017  ? Procedure: INTRAMEDULLARY  (IM) NAIL INTERTROCHANTRIC;  Surgeon: Corky Mull, MD;  Location: [facility] ORS;  Service: Orthopedics;  Laterality: Right;  ? INTRAMEDULLARY (IM) NAIL INTERTROCHANTERIC Left 03/09/2020  ? Procedure: INTRAMEDULLARY (IM) NAIL INTERTROCHANTRIC;  Surgeon: Thornton Park, MD;  Location: [facility] ORS;  Service: Orthopedics;  Laterality: Left;  ? INTUBATION-ENDOTRACHEAL WITH TRACHEOSTOMY STANDBY N/A 04/22/2015  ? Procedure: INTUBATION-ENDOTRACHEAL WITH TRACHEOSTOMY STANDBY;  Surgeon: Beverly Gust, MD;  Location: [facility] ORS;  Service: ENT;  Laterality: N/A;  ? JOINT REPLACEMENT Left   ? Lt shoulder  ? MANDIBLE SURGERY    ? ? ?Prior to Admission medications   ?Medication Sig Start Date End Date Taking? Authorizing Provider  ?ADVAIR DISKUS 500-50 MCG/ACT AEPB Inhale 1 puff into the lungs in the morning and at bedtime. 07/27/21  Yes [provider]  ?albuterol (VENTOLIN HFA) 108 (90 Base) MCG/ACT inhaler Inhale 2 puffs into the lungs every 6 (six) hours as needed for wheezing or shortness of breath. 01/21/21  Yes Allyne Gee, MD  ?bisacodyl (DULCOLAX) 5 MG EC tablet Take 1 tablet (5 mg total) by mouth daily as needed for moderate constipation. ?Patient taking differently: Take 5 mg by mouth daily. 10/04/21  Yes Lorella Nimrod, MD  ?cephALEXin (KEFLEX) 500 MG  capsule Take 1 capsule (500 mg total) by mouth every 8 (eight) hours for 4 days. ?Patient taking differently: Take 500 mg by mouth every 8 (eight) hours. Start date: 10/04/21 10/04/21 10/08/21 Yes Lorella Nimrod, MD  ?cetirizine (ZYRTEC) 10 MG tablet Take 10 mg by mouth daily.   Yes [provider]  ?diclofenac Sodium (VOLTAREN) 1 % GEL Apply 1 application. topically daily as needed (pain).   Yes [provider]  ?feeding supplement (ENSURE ENLIVE / ENSURE PLUS) LIQD Take 237 mLs by mouth 2 (two) times daily between meals. 10/04/21  Yes Lorella Nimrod, MD  ?ferrous [id number] C-folic acid (TRINSICON / FOLTRIN) capsule Take 1 capsule by mouth 2 (two)  times daily after a meal. 10/04/21  Yes Lorella Nimrod, MD  ?fluticasone (FLONASE) 50 MCG/ACT nasal spray Place 2 sprays into both nostrils daily as needed for allergies or rhinitis.   Yes Provider, Historic

## 2021-10-09 ENCOUNTER — Inpatient Hospital Stay (HOSPITAL_COMMUNITY): Payer: Medicaid Other

## 2021-10-09 ENCOUNTER — Inpatient Hospital Stay (HOSPITAL_COMMUNITY): Payer: Medicaid Other | Admitting: Anesthesiology

## 2021-10-09 ENCOUNTER — Encounter (HOSPITAL_COMMUNITY)
Admission: EM | Disposition: A | Discharge status: Home Health | Payer: Self-pay | Source: Home / Self Care | Attending: Internal Medicine

## 2021-10-09 DIAGNOSIS — K2101 Gastro-esophageal reflux disease with esophagitis, with bleeding: Secondary | ICD-10-CM

## 2021-10-09 DIAGNOSIS — K56609 Unspecified intestinal obstruction, unspecified as to partial versus complete obstruction: Secondary | ICD-10-CM | POA: Diagnosis not present

## 2021-10-09 DIAGNOSIS — K921 Melena: Secondary | ICD-10-CM

## 2021-10-09 DIAGNOSIS — K5711 Diverticulosis of small intestine without perforation or abscess with bleeding: Secondary | ICD-10-CM

## 2021-10-09 DIAGNOSIS — K922 Gastrointestinal hemorrhage, unspecified: Secondary | ICD-10-CM

## 2021-10-09 DIAGNOSIS — K209 Esophagitis, unspecified without bleeding: Secondary | ICD-10-CM

## 2021-10-09 DIAGNOSIS — K2961 Other gastritis with bleeding: Secondary | ICD-10-CM

## 2021-10-09 DIAGNOSIS — K2960 Other gastritis without bleeding: Secondary | ICD-10-CM

## 2021-10-09 DIAGNOSIS — K2100 Gastro-esophageal reflux disease with esophagitis, without bleeding: Secondary | ICD-10-CM

## 2021-10-09 DIAGNOSIS — K449 Diaphragmatic hernia without obstruction or gangrene: Secondary | ICD-10-CM

## 2021-10-09 DIAGNOSIS — K920 Hematemesis: Secondary | ICD-10-CM

## 2021-10-09 HISTORY — PX: ESOPHAGOGASTRODUODENOSCOPY (EGD) WITH PROPOFOL: SHX5813

## 2021-10-09 HISTORY — PX: BIOPSY: SHX5522

## 2021-10-09 LAB — COMPREHENSIVE METABOLIC PANEL
ALT: 21 U/L (ref 0–44)
AST: 23 U/L (ref 15–41)
Albumin: 2.3 g/dL — ABNORMAL LOW (ref 3.5–5.0)
Alkaline Phosphatase: 84 U/L (ref 38–126)
Anion gap: 4 — ABNORMAL LOW (ref 5–15)
BUN: 6 mg/dL (ref 6–20)
CO2: 22 mmol/L (ref 22–32)
Calcium: 7.6 mg/dL — ABNORMAL LOW (ref 8.9–10.3)
Chloride: 113 mmol/L — ABNORMAL HIGH (ref 98–111)
Creatinine, Ser: 0.7 mg/dL (ref 0.61–1.24)
GFR, Estimated: >60 mL/min (ref >60)
Glucose, Bld: 84 mg/dL (ref 70–99)
Potassium: 3.4 mmol/L — ABNORMAL LOW (ref 3.5–5.1)
Sodium: 139 mmol/L (ref 135–145)
Total Bilirubin: 0.5 mg/dL (ref 0.3–1.2)
Total Protein: 4.9 g/dL — ABNORMAL LOW (ref 6.5–8.1)

## 2021-10-09 LAB — CBC WITH DIFFERENTIAL/PLATELET
Abs Immature Granulocytes: 0.02 10*3/uL (ref 0.00–0.07)
Basophils Absolute: 0.1 10*3/uL (ref 0.0–0.1)
Basophils Relative: 1 %
Eosinophils Absolute: 0.2 10*3/uL (ref 0.0–0.5)
Eosinophils Relative: 3 %
HCT: 23 % — ABNORMAL LOW (ref 39.0–52.0)
Hemoglobin: 7.7 g/dL — ABNORMAL LOW (ref 13.0–17.0)
Immature Granulocytes: 0 %
Lymphocytes Relative: 20 %
Lymphs Abs: 1.5 10*3/uL (ref 0.7–4.0)
MCH: 35.2 pg — ABNORMAL HIGH (ref 26.0–34.0)
MCHC: 33.5 g/dL (ref 30.0–36.0)
MCV: 105 fL — ABNORMAL HIGH (ref 80.0–100.0)
Monocytes Absolute: 1 10*3/uL (ref 0.1–1.0)
Monocytes Relative: 14 %
Neutro Abs: 4.6 10*3/uL (ref 1.7–7.7)
Neutrophils Relative %: 62 %
Platelets: 355 10*3/uL (ref 150–400)
RBC: 2.19 MIL/uL — ABNORMAL LOW (ref 4.22–5.81)
RDW: 13.7 % (ref 11.5–15.5)
WBC: 7.3 10*3/uL (ref 4.0–10.5)
nRBC: 0 % (ref 0.0–0.2)

## 2021-10-09 LAB — PROCALCITONIN: Procalcitonin: <0.1 ng/mL

## 2021-10-09 LAB — PHOSPHORUS: Phosphorus: 3.1 mg/dL (ref 2.5–4.6)

## 2021-10-09 LAB — MAGNESIUM: Magnesium: 1.9 mg/dL (ref 1.7–2.4)

## 2021-10-09 SURGERY — ESOPHAGOGASTRODUODENOSCOPY (EGD) WITH PROPOFOL
Anesthesia: Monitor Anesthesia Care

## 2021-10-09 MED ORDER — SODIUM CHLORIDE 0.9 % IV SOLN
1.0000 g | INTRAVENOUS | Status: DC
  Administered 2021-10-09 – 2021-10-10 (×2): 1 g via INTRAVENOUS
  Filled 2021-10-09 (×2): qty 10

## 2021-10-09 MED ORDER — SODIUM CHLORIDE 0.9 % IV SOLN: INTRAVENOUS | Status: DC | PRN

## 2021-10-09 MED ORDER — DEXMEDETOMIDINE (PRECEDEX) IN NS 20 MCG/5ML (4 MCG/ML) IV SYRINGE
PREFILLED_SYRINGE | INTRAVENOUS | Status: DC | PRN
  Administered 2021-10-09: 8 ug via INTRAVENOUS

## 2021-10-09 MED ORDER — SODIUM CHLORIDE 0.9 % IV SOLN
510.0000 mg | Freq: Once | INTRAVENOUS | Status: AC
  Administered 2021-10-09: 510 mg via INTRAVENOUS
  Filled 2021-10-09: qty 17

## 2021-10-09 MED ORDER — PANTOPRAZOLE SODIUM 40 MG PO TBEC
40.0000 mg | DELAYED_RELEASE_TABLET | Freq: Two times a day (BID) | ORAL | Status: DC
  Administered 2021-10-09 – 2021-10-11 (×5): 40 mg via ORAL
  Filled 2021-10-09 (×5): qty 1

## 2021-10-09 MED ORDER — PROPOFOL 10 MG/ML IV BOLUS
INTRAVENOUS | Status: DC | PRN
  Administered 2021-10-09: 15 mg via INTRAVENOUS
  Administered 2021-10-09 (×2): 20 mg via INTRAVENOUS

## 2021-10-09 MED ORDER — LIDOCAINE 2% (20 MG/ML) 5 ML SYRINGE
INTRAMUSCULAR | Status: DC | PRN
  Administered 2021-10-09: 50 mg via INTRAVENOUS

## 2021-10-09 MED ORDER — FUROSEMIDE 10 MG/ML IJ SOLN
40.0000 mg | Freq: Two times a day (BID) | INTRAMUSCULAR | Status: DC
  Administered 2021-10-09 – 2021-10-10 (×4): 40 mg via INTRAVENOUS
  Filled 2021-10-09 (×4): qty 4

## 2021-10-09 MED ORDER — PROPOFOL 500 MG/50ML IV EMUL
INTRAVENOUS | Status: DC | PRN
  Administered 2021-10-09: 125 ug/kg/min via INTRAVENOUS

## 2021-10-09 MED ORDER — POTASSIUM CHLORIDE 10 MEQ/100ML IV SOLN
10.0000 meq | INTRAVENOUS | Status: AC
  Administered 2021-10-09 (×2): 10 meq via INTRAVENOUS
  Filled 2021-10-09 (×3): qty 100

## 2021-10-09 MED ORDER — POTASSIUM CHLORIDE 10 MEQ/100ML IV SOLN
10.0000 meq | INTRAVENOUS | Status: AC
  Administered 2021-10-09 (×4): 10 meq via INTRAVENOUS
  Filled 2021-10-09 (×2): qty 100

## 2021-10-09 SURGICAL SUPPLY — 15 items
BLOCK BITE 60FR ADLT L/F BLUE (MISCELLANEOUS) ×3 IMPLANT
ELECT REM PT RETURN 9FT ADLT (ELECTROSURGICAL) IMPLANT
ELECTRODE REM PT RTRN 9FT ADLT (ELECTROSURGICAL) IMPLANT
FORCEP RJ3 GP 1.8X160 W-NEEDLE (CUTTING FORCEPS) IMPLANT
FORCEPS BIOP RAD 4 LRG CAP 4 (CUTTING FORCEPS) IMPLANT
NDL SCLEROTHERAPY 25GX240 (NEEDLE) IMPLANT
NEEDLE SCLEROTHERAPY 25GX240 (NEEDLE) IMPLANT
PROBE APC STR FIRE (PROBE) IMPLANT
PROBE INJECTION GOLD (MISCELLANEOUS)
PROBE INJECTION GOLD 7FR (MISCELLANEOUS) IMPLANT
SNARE SHORT THROW 13M SML OVAL (MISCELLANEOUS) IMPLANT
SYR 50ML LL SCALE MARK (SYRINGE) IMPLANT
TUBING ENDO SMARTCAP PENTAX (MISCELLANEOUS) ×6 IMPLANT
TUBING IRRIGATION ENDOGATOR (MISCELLANEOUS) ×3 IMPLANT
WATER STERILE IRR 1000ML POUR (IV SOLUTION) IMPLANT

## 2021-10-09 NOTE — Anesthesia Procedure Notes (Signed)
Procedure Name: [place] ?Date/Time: 10/09/2021 11:09 AM ?Performed by: Mariea Clonts, CRNA ?Pre-anesthesia Checklist: Emergency Drugs available, Patient identified, Suction available, Patient being monitored and Timeout performed ?Patient Re-evaluated:Patient Re-evaluated prior to induction ?Oxygen Delivery Method: Simple face mask and Nasal cannula ? ? ? ? ?

## 2021-10-09 NOTE — Progress Notes (Signed)
11am and 12pm Potasium not given. Patient off floor in endo for procedure. Adhikari,MD made aware ?

## 2021-10-09 NOTE — Plan of Care (Signed)
  Problem: Pain Managment: Goal: General experience of comfort will improve Outcome: Progressing   Problem: Safety: Goal: Ability to remain free from injury will improve Outcome: Progressing   

## 2021-10-09 NOTE — Progress Notes (Signed)
Patient arrived back to [address] alert and oriented x4. Bed in lowest position. Call light in reach,full liquid lunch tray ordered ?

## 2021-10-09 NOTE — Interval H&P Note (Signed)
History and Physical Interval Note: ? ?10/09/2021 ?10:58 AM ? ?Rubin Kai Alsip  has presented today for surgery, with the diagnosis of GI bleed. Anemia. Hematemesis..  The various methods of treatment have been discussed with the patient and family. After consideration of risks, benefits and other options for treatment, the patient has consented to  Procedure(s): ?ESOPHAGOGASTRODUODENOSCOPY (EGD) WITH PROPOFOL (N/A) as a surgical intervention.  The patient's history has been reviewed, patient examined, no change in status, stable for surgery.  I have reviewed the patient's chart and labs.  Questions were answered to the patient's satisfaction.   ? ?Gastrograffin study showed contrast in the colon this morning.  No further emesis.  Tolerated full liquid diet last night.  Having multiple bowel movements since drinking the gastrograffin (liquid, dark brown).  Hgb stable from yesterday.  BUN normal. ? ?Daryel November ? ? ?

## 2021-10-09 NOTE — Transfer of Care (Signed)
Immediate Anesthesia Transfer of Care Note ? ?Patient: Perry Bullock ? ?Procedure(s) Performed: ESOPHAGOGASTRODUODENOSCOPY (EGD) WITH PROPOFOL ?BIOPSY ? ?Patient Location: PACU ? ?Anesthesia Type:MAC ? ?Level of Consciousness: awake, alert  and oriented ? ?Airway & Oxygen Therapy: Patient Spontanous Breathing and Patient connected to nasal cannula oxygen ? ?Post-op Assessment: Report given to RN and Post -op Vital signs reviewed and stable ? ?Post vital signs: Reviewed and stable ? ?Last Vitals:  ?Vitals Value Taken Time  ?BP 93/79 10/09/21 1137  ?Temp    ?Pulse 94 10/09/21 1138  ?Resp 23 10/09/21 1138  ?SpO2 100 % 10/09/21 1138  ?Vitals shown include unvalidated device data. ? ?Last Pain:  ?Vitals:  ? 10/09/21 1033  ?TempSrc: Oral  ?PainSc: 8   ?   ? ?Patients Stated Pain Goal: 0 (10/08/21 0402) ? ?Complications: No notable events documented. ?

## 2021-10-09 NOTE — Op Note (Signed)
[facility] ?Patient Name: Perry Bullock ?Procedure Date : 10/09/2021 ?MRN: [id number] ?Attending MD: Gladstone Pih. Candis Schatz , MD ?Date of Birth: 27-Oct-1960 ?CSN: [id number] ?Age: 61 ?Admit Type: Inpatient ?Procedure:                Upper GI endoscopy ?Indications:              Coffee-ground emesis, Melena ?Providers:                Gladstone Pih. Candis Schatz, MD, Jeanella Cara, RN,  ?                          Luan Moore, Merchant [employer] officer, Virgilio Belling. Beckner,  ?                          CRNA ?Referring MD:              ?Medicines:                Monitored Anesthesia Care ?Complications:            No immediate complications. ?Estimated Blood Loss:     Estimated blood loss was minimal. ?Procedure:                Pre-Anesthesia Assessment: ?                          - Prior to the procedure, a History and Physical  ?                          was performed, and patient medications and  ?                          allergies were reviewed. The patient's tolerance of  ?                          previous anesthesia was also reviewed. The risks  ?                          and benefits of the procedure and the sedation  ?                          options and risks were discussed with the patient.  ?                          All questions were answered, and informed consent  ?                          was obtained. Prior Anticoagulants: The patient has  ?                          taken no previous anticoagulant or antiplatelet  ?                          agents. ASA Grade Assessment: II - A patient with  ?  mild systemic disease. After reviewing the risks  ?                          and benefits, the patient was deemed in  ?                          satisfactory condition to undergo the procedure. ?                          After obtaining informed consent, the endoscope was  ?                          passed under direct vision. Throughout the  ?                          procedure, the  patient's blood pressure, pulse, and  ?                          oxygen saturations were monitored continuously. The  ?                          [id number]) Olympus endoscope was introduced  ?                          through the mouth, and advanced to the third part  ?                          of duodenum. The upper GI endoscopy was  ?                          accomplished without difficulty. The patient  ?                          tolerated the procedure well. ?Scope In: ?Scope Out: ?Findings: ?     The examined portions of the nasopharynx, oropharynx and larynx were  ?     normal. ?     LA Grade D (one or more mucosal breaks involving at least 75% of  ?     esophageal circumference) esophagitis with no bleeding was found in the  ?     distal esophagus. Biopsies were taken with a cold forceps for histology.  ?     Estimated blood loss was minimal. ?     The exam of the esophagus was otherwise normal. ?     A 6 cm hiatal hernia was present. ?     Diffuse prominent gastric folds were found in the gastric body. Biopsies  ?     were taken with a cold forceps for histology. Estimated blood loss was  ?     minimal. ?     The exam of the stomach was otherwise normal. ?     A medium non-bleeding diverticulum was found in the second portion of  ?     the duodenum. ?     The exam of the duodenum was otherwise normal. ?Impression:               - The examined portions of the  nasopharynx,  ?                          oropharynx and larynx were normal. ?                          - LA Grade D reflux esophagitis with no bleeding.  ?                          Biopsied. This is the most likely source of his  ?                          upper GI bleed ?                          - 6 cm hiatal hernia. ?                          - Enlarged gastric folds. Biopsied. ?                          - Non-bleeding duodenal diverticulum. ?                          - No evidence of ulcer. No blood or bleeding  ?                           stigmata seen. ?Recommendation:           - Return patient to hospital ward for ongoing care. ?                          - Advance diet as tolerated. Given ileus vs partial  ?                          SBO, would try soft/low residue diet initially ?                          - Continue present medications. ?                          - Take Protonix 40 mg PO BID for 8 weeks to heal  ?                          esophagitis, then once daily ?                          - Await pathology results. ?                          - Repeat upper endoscopy in 8 weeks to check  ?                          healing. ?Procedure Code(s):        --- Professional --- ?  [id number], Esophagogastroduodenoscopy, flexible,  ?                          transoral; with biopsy, single or multiple ?Diagnosis Code(s):        --- Professional --- ?                          K21.00, Gastro-esophageal reflux disease with  ?                          esophagitis, without bleeding ?                          K44.9, Diaphragmatic hernia without obstruction or  ?                          gangrene ?                          K29.60, Other gastritis without bleeding ?                          K92.0, Hematemesis ?                          K92.1, Melena (includes Hematochezia) ?                          K57.10, Diverticulosis of small intestine without  ?                          perforation or abscess without bleeding ?CPT copyright 2019 [employer]. All rights reserved. ?The codes documented in this report are preliminary and upon coder review may  ?be revised to meet current compliance requirements. ?Malak Duchesneau E. Candis Schatz, MD ?10/09/2021 11:39:51 AM ?This report has been signed electronically. ?Number of Addenda: 0 ?

## 2021-10-09 NOTE — Progress Notes (Signed)
?PROGRESS NOTE ? ?Perry Bullock  [id number] DOB: May 15, 1961 DOA: 10/06/2021 ?PCP: [facility]  ? ?Brief Narrative: ? ?Patient is a 61 year old male with history of COPD, asthma, bipolar disorder, osteoarthritis, anxiety, polysubstance/alcohol/tobacco abuse, seizure disorder, grade 1 diastolic dysfunction, hepatitis C who presented to the emergency department with abdominal pain, recurrent nausea/vomiting, coffee-ground emesis.  He was recently admitted at [facility] hospital from 4/3 - 4/7 for syncopal, left acetabular fracture which was nonoperatively managed.  On presentation, abdomen/pelvis CT scan showed partial SBO with no exact transition point.  General surgery was consulted.  General surgery thought that patient abdomen symptoms are from reactive ileus rather than true obstruction so did not recommend NG tube placement, he was on clear liquid diet.  GI also consulted for coffee-ground emesis.  Underwent EGD today ? ?Assessment & Plan: ? ?Principal Problem: ?  SBO (small bowel obstruction) ([place]) ?Active Problems: ?  Acetabular fracture ([place]) ?  Sepsis due to gram-negative UTI ([place]) ?  Severe sepsis ([place]) ?  COPD (chronic obstructive pulmonary disease) ([place]) ?  Bipolar 1 disorder ([place]) ?  Seizure disorder ([place]) ?  Macrocytic anemia ? ?SBO versus ileus: Presented with abdominal pain, nausea, vomiting. On presentation, abdomen/pelvis CT scan showed partial SBO with no exact transition point.  General surgery was consulted.  General surgery thought that patient abdomen symptoms are from reactive ileus rather than true obstruction so did not recommend NG tube placement, he was on clear liquid diet.  ?Abdominal x-ray done today showed contrast in the colon.  General surgery cleared for advancing the diet.  He is having several bowel movements today.  We will start on soft diet.  We will continue bowel regimen on discharge ? ?Coffee-ground emesis/upper GI bleed/acute blood loss anemia: Hemoglobin  dropped to the range of 7 from 9.  Iron studies showed low iron.  Given a dose of IV iron  infusion . ?GI consulted.  Currently on PPI IV twice daily.   He needs to continue iron supplementation . ?EGD showed grade D esophagitis.  GI recommending to continue PPI twice daily for 8 weeks. ? ?Severe volume overload: Patient looks volume overloaded with bilateral wrist crackles, scrotal edema, lower extremity edema.  This is most likely secondary to aggressive IV fluid resuscitation for SBO.  We will stop IV fluids.  Will start on Lasix 40 mg twice daily. ? ?Sepsis/gram-negative UTI: Presented with tachycardia, tachypnea, elevated lactate level.  UA was suggestive of UTI.  Urine culture has not shown any growth.  Antibiotics will be discontinued after 5 days of treatment. ? ?Recent acetabular fracture: Recently admitted at [facility] hospital for the same.  Managed conservatively.  He needs to follow-up with orthopedics as an outpatient.  Patient was discharged to a SNF for rehabilitation.  Continue pain management, supportive care. ?PT/OT recommended home health on discharge. ? ?COPD: Continue inhalers, bronchodilators.  Currently on 2 L, will try to wean the oxygen.  Not on oxygen at home. ? ?Tobacco use: Counseled for cessation.  Continue nicotine patch ? ?Seizure disorder: On Neurontin ? ?Bipolar 1 disorder: On Seroquel, trazodone ? ?Hypokalemia: Supplemented with potassium. ? ? ?  ?  ? ?DVT prophylaxis:SCD ? ? ?  Code Status: Full Code ? ?Family Communication: None at bedside ? ?Patient status:Inpatient ? ?Patient is from :Home ? ?Anticipated discharge [id number] ? ?Estimated DC date:tomorrow ? ? ?Consultants: Surgery, GI ? ?Procedures: EGD ? ?Antimicrobials:  ?Anti-infectives (From admission, onward)  ? ? Start     Dose/Rate Route  Frequency Ordered Stop  ? 10/07/21 1800  cefTRIAXone (ROCEPHIN) 2 g in sodium chloride 0.9 % 100 mL IVPB       ? 2 g ?200 mL/hr over 30 Minutes Intravenous Every 24 hours 10/06/21 2108 10/14/21  1759  ? 10/06/21 1745  cefTRIAXone (ROCEPHIN) 2 g in sodium chloride 0.9 % 100 mL IVPB       ? 2 g ?200 mL/hr over 30 Minutes Intravenous  Once 10/06/21 1733 10/06/21 1840  ? 10/06/21 1745  metroNIDAZOLE (FLAGYL) IVPB 500 mg       ? 500 mg ?100 mL/hr over 60 Minutes Intravenous  Once 10/06/21 1733 10/06/21 1917  ? ?  ? ? ?Subjective: ?Patient seen and examined at the bedside this morning.  He was sitting in the chair.  He was having loose bowel movements this morning.  Denies abdomen pain, nausea or vomiting.  He was waiting for EGD. ? ?Objective: ?Vitals:  ? 10/08/21 0822 10/08/21 1603 10/08/21 1946 10/09/21 0301  ?BP: 126/78 124/81 130/83 137/75  ?Pulse: 88 91 (!) 108 (!) 110  ?Resp: 18 17 18 20   ?Temp: 98.6 ?F (37 ?C) 98.7 ?F (37.1 ?C) 98.4 ?F (36.9 ?C) 98.4 ?F (36.9 ?C)  ?TempSrc: Oral Oral Oral   ?SpO2: 98% 100% 100% 94%  ?Weight:      ?Height:      ? ? ?Intake/Output Summary (Last 24 hours) at 10/09/2021 0748 ?Last data filed at 10/08/2021 1800 ?Gross per 24 hour  ?Intake 2116.1 ml  ?Output 175 ml  ?Net 1941.1 ml  ? ?Filed Weights  ? 10/06/21 1726  ?Weight: 70.3 kg  ? ? ?Examination: ? ?General exam: Overall comfortable, not in distress, deconditioned ?HEENT: PERRL ?Respiratory system: Bilateral basal crackles ?Cardiovascular system: S1 & S2 heard, RRR.  ?Gastrointestinal system: Abdomen is nondistended, soft and nontender. ?Central nervous system: Alert and oriented ?Extremities: 1-2+ bilateral lower extremity pitting edema, no clubbing ,no cyanosis ?Skin: No rashes, no ulcers,no icterus   ?GU: Scrotal edema ? ? ?Data Reviewed: I have personally reviewed following labs and imaging studies ? ?CBC: ?Recent Labs  ?Lab 10/04/21 ?0645 10/06/21 ?1835 10/07/21 ?[id number] 10/08/21 ?[id number] 10/09/21 ?0225  ?WBC 8.3 10.2 8.6 6.8 7.3  ?NEUTROABS  --  7.5  --  4.3 4.6  ?HGB 8.1* 9.5* 8.1* 7.4* 7.7*  ?HCT 24.6* 29.2* 25.3* 22.7* 23.0*  ?MCV 102.5* 105.0* 105.9* 105.1* 105.0*  ?PLT 255 427* 365 359 355  ? ?Basic Metabolic  Panel: ?Recent Labs  ?Lab 10/04/21 ?0645 10/06/21 ?1733 10/07/21 ?[id number] 10/08/21 ?[id number] 10/09/21 ?0225  ?NA 133* 134* 137 139 139  ?K 4.9 5.0 3.9 3.5 3.4*  ?CL 101 91* 103 110 113*  ?CO2 25 28 27 22 22   ?GLUCOSE 110* 135* 95 79 84  ?BUN 29* 31* 21* 11 6  ?CREATININE 0.73 1.14 0.91 0.77 0.70  ?CALCIUM 9.0 8.9 7.7* 7.7* 7.6*  ?MG  --   --   --  1.4* 1.9  ?PHOS  --   --   --  3.2 3.1  ? ? ? ?Recent Results (from the past 240 hour(s))  ?Urine Culture     Status: Abnormal  ? Collection Time: 09/30/21  5:54 PM  ? Specimen: Urine, Random  ?Result Value Ref Range Status  ? Specimen Description   Final  ?  URINE, RANDOM ?Performed at [facility], [address] [place], [place] [address] ?  ? Special Requests   Final  ?  NONE ?Performed at [facility], [place]  [address], [place], [place] [address] ?  ? Culture >=100,000 COLONIES/mL ESCHERICHIA COLI (A)  Final  ? Report Status 10/03/2021 FINAL  Final  ? Organism ID, Bacteria ESCHERICHIA COLI (A)  Final  ?    Susceptibility  ? Escherichia coli - MIC*  ?  AMPICILLIN >=32 RESISTANT Resistant   ?  CEFAZOLIN <=4 SENSITIVE Sensitive   ?  CEFEPIME <=0.12 SENSITIVE Sensitive   ?  CEFTRIAXONE <=0.25 SENSITIVE Sensitive   ?  CIPROFLOXACIN 1 RESISTANT Resistant   ?  GENTAMICIN <=1 SENSITIVE Sensitive   ?  IMIPENEM <=0.25 SENSITIVE Sensitive   ?  NITROFURANTOIN <=16 SENSITIVE Sensitive   ?  TRIMETH/SULFA >=320 RESISTANT Resistant   ?  AMPICILLIN/SULBACTAM >=32 RESISTANT Resistant   ?  PIP/TAZO <=4 SENSITIVE Sensitive   ?  * >=100,000 COLONIES/mL ESCHERICHIA COLI  ?Urine Culture     Status: None  ? Collection Time: 10/06/21  5:33 PM  ? Specimen: In/Out Cath Urine  ?Result Value Ref Range Status  ? Specimen Description IN/OUT CATH URINE  Final  ? Special Requests NONE  Final  ? Culture   Final  ?  NO GROWTH ?Performed at [facility], [place] [address] [place], [place] [address] ?  ? Report Status 10/07/2021 FINAL  Final  ?Blood Culture (routine x 2)     Status: None  (Preliminary result)  ? Collection Time: 10/06/21  5:38 PM  ? Specimen: BLOOD RIGHT WRIST  ?Result Value Ref Range Status  ? Specimen Description BLOOD RIGHT WRIST  Final  ? Special Requests   Final  ?  BOTTLES DRA

## 2021-10-09 NOTE — Progress Notes (Signed)
Mobility Specialist Progress Note: ? ? 10/09/21 1032  ?Mobility  ?Activity Off unit  ? ?Will follow-up as time allows. ? ?Jameson Morrow ?Mobility Specialist ?Primary Phone [phone] ? ?

## 2021-10-09 NOTE — Progress Notes (Signed)
[facility] ?Progress Note ? ?   ?Subjective: ?CC-  ?Tolerating clear liquids last night without n/v. States that he had several loose Bms over night and needs to have another one now. Xray shows contrast in colon. ? ?Objective: ?Vital signs in last 24 hours: ?Temp:  [98.4 ?F (36.9 ?C)-98.7 ?F (37.1 ?C)] 98.4 ?F (36.9 ?C) (04/12 0301) ?Pulse Rate:  [91-110] 110 (04/12 0301) ?Resp:  [17-20] 20 (04/12 0301) ?BP: (124-137)/(75-83) 137/75 (04/12 0301) ?SpO2:  [94 %-100 %] 99 % (04/12 0753) ?Last BM Date : 10/07/21 ? ?Intake/Output from previous day: ?04/11 0701 - 04/12 0700 ?In: 2116.1 [P.O.:240; I.V.:1676.1; IV Piggyback:200] ?Out: 300 [Urine:300] ?Intake/Output this shift: ?No intake/output data recorded. ? ?PE: ?Gen:  Alert, NAD, pleasant ?Abd: soft, mild distension, nontender ? ?Lab Results:  ?Recent Labs  ?  10/08/21 ?7408 10/09/21 ?0225  ?WBC 6.8 7.3  ?HGB 7.4* 7.7*  ?HCT 22.7* 23.0*  ?PLT 359 355  ? ?BMET ?Recent Labs  ?  10/08/21 ?1448 10/09/21 ?0225  ?NA 139 139  ?K 3.5 3.4*  ?CL 110 113*  ?CO2 22 22  ?GLUCOSE 79 84  ?BUN 11 6  ?CREATININE 0.77 0.70  ?CALCIUM 7.7* 7.6*  ? ?PT/INR ?Recent Labs  ?  10/06/21 ?1733 10/07/21 ?0533  ?LABPROT 13.2 14.0  ?INR 1.0 1.1  ? ?CMP  ?   ?Component Value Date/Time  ? NA 139 10/09/2021 0225  ? NA 139 07/13/2014 0933  ? K 3.4 (L) 10/09/2021 0225  ? K 3.9 07/13/2014 0933  ? CL 113 (H) 10/09/2021 0225  ? CL 105 07/13/2014 0933  ? CO2 22 10/09/2021 0225  ? CO2 25 07/13/2014 0933  ? GLUCOSE 84 10/09/2021 0225  ? GLUCOSE 92 07/13/2014 0933  ? BUN 6 10/09/2021 0225  ? BUN 4 (L) 07/13/2014 0933  ? CREATININE 0.70 10/09/2021 0225  ? CREATININE 0.79 07/13/2014 0933  ? CALCIUM 7.6 (L) 10/09/2021 0225  ? CALCIUM 8.1 (L) 07/13/2014 0933  ? PROT 4.9 (L) 10/09/2021 0225  ? PROT 7.9 07/13/2014 0933  ? ALBUMIN 2.3 (L) 10/09/2021 0225  ? ALBUMIN 3.1 (L) 07/13/2014 0933  ? AST 23 10/09/2021 0225  ? AST 37 07/13/2014 0933  ? ALT 21 10/09/2021 0225  ? ALT 25 07/13/2014 0933  ? ALKPHOS  84 10/09/2021 0225  ? ALKPHOS 71 07/13/2014 0933  ? BILITOT 0.5 10/09/2021 0225  ? BILITOT 0.1 (L) 07/13/2014 0933  ? GFRNONAA >60 10/09/2021 0225  ? GFRNONAA >60 07/13/2014 0933  ? GFRNONAA 42 (L) 04/22/2013 0750  ? GFRAA >60 03/14/2020 0357  ? GFRAA >60 07/13/2014 0933  ? GFRAA 48 (L) 04/22/2013 0750  ? ?Lipase  ?   ?Component Value Date/Time  ? LIPASE 34 07/17/2019 1629  ? LIPASE 141 07/13/2014 0933  ? ? ? ? ? ?Studies/Results: ?DG Abd 2 Views ? ?Result Date: 10/07/2021 ?CLINICAL DATA:  Small-bowel obstruction EXAM: ABDOMEN - 2 VIEW COMPARISON:  CT abdomen/pelvis April 9, 23. FINDINGS: Moderately dilated small bowel loops, compatible with partial small bowel obstruction seen on recent CT abdomen/pelvis. There is some gas within colon. No evidence of free air or portal venous gas. No abnormal mass effect or pathologic calcifications. Partially imaged bilateral proximal femur hardware. Polyarticular degenerative change. IMPRESSION: Moderately dilated small bowel loops with gas in colon, compatible with partial small bowel obstruction seen on recent CT abdomen/pelvis. Electronically Signed   By: Feliberto Harts M.D.   On: 10/07/2021 10:23  ? ?DG Abd Portable 1V-Small Bowel Obstruction Protocol-initial, 8 hr  delay ? ?Result Date: 10/09/2021 ?CLINICAL DATA:  Small-bowel obstruction EXAM: PORTABLE ABDOMEN - 1 VIEW COMPARISON:  10/08/2021 FINDINGS: Administered contrast now lies almost entirely within the colon consistent with partial small bowel obstruction. The degree of small-bowel dilatation is stable from the prior study. Postsurgical changes in the proximal femurs are noted. IMPRESSION: Contrast material within the colon consistent with a partial small bowel obstruction. Degree of small-bowel dilatation is stable. Electronically Signed   By: Alcide Clever M.D.   On: 10/09/2021 02:54  ? ?DG Abd Portable 1V ? ?Result Date: 10/08/2021 ?CLINICAL DATA:  Abdominal distension EXAM: PORTABLE ABDOMEN - 1 VIEW COMPARISON:   None. FINDINGS: Scattered large and small bowel gas is noted. Mild retained fecal material is seen. Mild residual small bowel dilatation is seen although improved from the prior study. No free air is noted. Orthopedic fixation of the femurs is noted bilaterally. IMPRESSION: Slight improvement in the degree of small-bowel distention. Electronically Signed   By: Alcide Clever M.D.   On: 10/08/2021 04:04   ? ?Anti-infectives: ?Anti-infectives (From admission, onward)  ? ? Start     Dose/Rate Route Frequency Ordered Stop  ? 10/09/21 1800  cefTRIAXone (ROCEPHIN) 1 g in sodium chloride 0.9 % 100 mL IVPB       ? 1 g ?200 mL/hr over 30 Minutes Intravenous Every 24 hours 10/09/21 0759 10/12/21 1759  ? 10/07/21 1800  cefTRIAXone (ROCEPHIN) 2 g in sodium chloride 0.9 % 100 mL IVPB  Status:  Discontinued       ? 2 g ?200 mL/hr over 30 Minutes Intravenous Every 24 hours 10/06/21 2108 10/09/21 0759  ? 10/06/21 1745  cefTRIAXone (ROCEPHIN) 2 g in sodium chloride 0.9 % 100 mL IVPB       ? 2 g ?200 mL/hr over 30 Minutes Intravenous  Once 10/06/21 1733 10/06/21 1840  ? 10/06/21 1745  metroNIDAZOLE (FLAGYL) IVPB 500 mg       ? 500 mg ?100 mL/hr over 60 Minutes Intravenous  Once 10/06/21 1733 10/06/21 1917  ? ?  ? ? ? ?Assessment/Plan ?SBO vs ileus ?- no prior abdominal surgeries ?- more likely a reactive ileus than SBO ?- Patient is having bowel function and xray shows contrast in colon. No indication for surgery. Ok to advance to full liquids when he gets back from endo from our standpoint, and advance diet as tolerated. Continue mobilizing and monitor electrolytes. Recommend bowel regimen as needed. ?We will sign off, please call with questions or concerns. ?  ?FEN: NPO, IVF @ 125 ml/hr ?ID: rocephin for UTI ?VTE: lovenox ?  ?Sepsis secondary to UTI ?Bipolar 1 disorder ?Acetabular fracture ?COPD ?Seizure disorder  ?  ?I reviewed hospitalist notes and Gastroenterology note, last 24 h vitals and pain scores, last 48 h intake and  output, last 24 h labs and trends (including CBC and CMP), and last 24 h imaging results (including abdominal xray from today) ? ? ? LOS: 3 days  ? ? ?Franne Forts, PA-C ?[facility] ?10/09/2021, 8:27 AM ?Please see Amion for pager number during day hours 7:00am-4:30pm ? ?

## 2021-10-09 NOTE — Anesthesia Preprocedure Evaluation (Addendum)
Anesthesia Evaluation  ?Patient identified by MRN, date of birth, ID band ?Patient awake ? ? ? ?Reviewed: ?Allergy & Precautions, NPO status , Patient's Chart, lab work & pertinent test results ? ?Airway ?Mallampati: II ? ?TM Distance: >3 FB ?Neck ROM: Full ? ? ? Dental ? ?(+) Dental Advisory Given, Poor Dentition ?  ?Pulmonary ?asthma , COPD (3lpm at home ),  COPD inhaler and oxygen dependent, Current Smoker,  ?  ?Pulmonary exam normal ?breath sounds clear to auscultation ? ? ? ? ? ? Cardiovascular ?+ Peripheral Vascular Disease  ?Normal cardiovascular exam ?Rhythm:Regular Rate:Normal ? ? ?  ?Neuro/Psych ? Headaches, neg Seizures PSYCHIATRIC DISORDERS Anxiety Depression Bipolar Disorder   ? GI/Hepatic ?GERD  Medicated and Controlled,(+) Hepatitis -, CGIB, hematemesis- last episode 4d ago ?  ?Endo/Other  ?negative endocrine ROS ? Renal/GU ?negative Renal ROS  ?negative genitourinary ?  ?Musculoskeletal ? ?(+) Arthritis , Osteoarthritis,   ? Abdominal ?  ?Peds ? Hematology ? ?(+) Blood dyscrasia, anemia , Hb 7.7   ?Anesthesia Other Findings ?Hx mandible surgery  ? Reproductive/Obstetrics ?negative OB ROS ? ?  ? ? ? ? ? ? ? ? ? ? ? ? ? ?  ?  ? ? ? ? ? ? ? ?Anesthesia Physical ?Anesthesia Plan ? ?ASA: 4 ? ?Anesthesia Plan: MAC  ? ?Post-op Pain Management:   ? ?Induction:  ? ?PONV Risk Score and Plan: 2 and Propofol infusion and TIVA ? ?Airway Management Planned: Natural Airway and Simple Face Mask ? ?Additional Equipment: None ? ?Intra-op Plan:  ? ?Post-operative Plan:  ? ?Informed Consent: I have reviewed the patients History and Physical, chart, labs and discussed the procedure including the risks, benefits and alternatives for the proposed anesthesia with the patient or authorized representative who has indicated his/her understanding and acceptance.  ? ? ? ? ? ?Plan Discussed with: CRNA ? ?Anesthesia Plan Comments:   ? ? ? ? ? ? ?Anesthesia Quick Evaluation ? ?

## 2021-10-09 NOTE — Progress Notes (Addendum)
Physical Therapy Treatment ?Patient Details ?Name: Perry Bullock ?MRN: [id number] ?DOB: 11/27/1960 ?Today's Date: 10/09/2021 ? ? ?History of Present Illness Pt is a 61 y.o. male admitted from SNF 10/06/21 with generalized abdominal pain. Workup revealed partial SBO; T8, T10-11 compression fxs, likely chronic. Of note, recent admissions at [facility] 4/3-10/04/21 for syncope with fall, non-operative L acetabular fx with d/c to SNF. Other PMH includes blind R eye, bipolar 1 disorder, gluacoma, Hep C, COPD, DDD, PVD, chronic back pain, asthma, anxiety, arthritis. ?  ?PT Comments  ? ? Pt progressing with mobility; able to transfer and ambulate with RW and supervision to min guard for balance; good ability to maintain LLE TDWB precautions with initial cuing. Session limited by early arrival of transport for procedure. Continue to recommend HHPT services to maximize functional mobility and independence upon return home. ?   ?Recommendations for follow up therapy are one component of a multi-disciplinary discharge planning process, led by the attending physician.  Recommendations may be updated based on patient status, additional functional criteria and insurance authorization. ? ?Follow Up Recommendations ? Home health PT ?  ?  ?Assistance Recommended at Discharge Intermittent Supervision/Assistance  ?Patient can return home with the following A little help with walking and/or transfers;A little help with bathing/dressing/bathroom;Assistance with cooking/housework;Assist for transportation;Help with stairs or ramp for entrance ?  ?Equipment Recommendations ? None recommended by PT  ?  ?Recommendations for Other Services   ? ? ?  ?Precautions / Restrictions Precautions ?Precautions: Fall ?Restrictions ?Weight Bearing Restrictions: Yes ?LLE Weight Bearing: Touchdown weight bearing ?Other Position/Activity Restrictions: Recent L acetabular fx (non-operative management)  ?  ? ?Mobility ? Bed Mobility ?Overal bed mobility: Modified  Independent ?  ?  ?  ?  ?  ?  ?General bed mobility comments: return to supine with increased time, use of UE to assist LLE into bed ?  ? ?Transfers ?Overall transfer level: Needs assistance ?Equipment used: Rolling walker (2 wheels) ?Transfers: Sit to/from Stand ?Sit to Stand: Supervision ?  ?  ?  ?  ?  ?General transfer comment: able to stand from recliner and BSC (over toilet) to RW with increased time due to pain, good hand placement and sequencing; supervision for safety ?  ? ?Ambulation/Gait ?Ambulation/Gait assistance: Min guard ?Gait Distance (Feet): 52 Feet ?Assistive device: Rolling walker (2 wheels) ?  ?  ?  ?  ?General Gait Details: Slow, antalgic gait with RW and intermittent min guard for balance; 1x initial verbal cue for LLE TDWB precautions, which pt able to adjusted well, at times hopping on RLE then TDWB ? ? ?Stairs ?  ?  ?  ?  ?  ? ? ?Wheelchair Mobility ?  ? ?Modified Rankin (Stroke Patients Only) ?  ? ? ?  ?Balance Overall balance assessment: Needs assistance ?Sitting-balance support: No upper extremity supported ?Sitting balance-Leahy Scale: Good ?Sitting balance - Comments: indep with posterior pericare sitting on toilet ?  ?Standing balance support: Single extremity supported, Bilateral upper extremity supported, During functional activity ?Standing balance-Leahy Scale: Poor ?Standing balance comment: reliant on RW ?  ?  ?  ?  ?  ?  ?  ?  ?  ?  ?  ?  ? ?  ?Cognition Arousal/Alertness: Awake/alert ?Behavior During Therapy: [facility] for tasks assessed/performed ?Overall Cognitive Status: Within Functional Limits for tasks assessed ?  ?  ?  ?  ?  ?  ?  ?  ?  ?  ?  ?  ?  ?  ?  ?  ?  General Comments: WFL for simple tasks; requires some redirection in conversation; good ability to problem solve through safe mobility at home ?  ?  ? ?  ?Exercises   ? ?  ?General Comments General comments (skin integrity, edema, etc.): session limited by arrival of transport for procedure ?  ?  ? ?Pertinent Vitals/Pain  Pain Assessment ?Pain Assessment: Faces ?Faces Pain Scale: Hurts even more ?Pain Location: left hip ?Pain Descriptors / Indicators: Sore ?Pain Intervention(s): Monitored during session, Limited activity within patient's tolerance  ? ? ?Home Living   ?  ?  ?  ?  ?  ?  ?  ?  ?  ?   ?  ?Prior Function    ?  ?  ?   ? ?PT Goals (current goals can now be found in the care plan section) Progress towards PT goals: Progressing toward goals ? ?  ?Frequency ? ? ? Min 5X/week ? ? ? ?  ?PT Plan Current plan remains appropriate  ? ? ?Co-evaluation   ?  ?  ?  ?  ? ?  ?AM-PAC PT "6 Clicks" Mobility   ?Outcome Measure ? Help needed turning from your back to your side while in a flat bed without using bedrails?: None ?Help needed moving from lying on your back to sitting on the side of a flat bed without using bedrails?: None ?Help needed moving to and from a bed to a chair (including a wheelchair)?: A Little ?Help needed standing up from a chair using your arms (e.g., wheelchair or bedside chair)?: A Little ?Help needed to walk in hospital room?: A Little ?Help needed climbing 3-5 steps with a railing? : A Lot ?6 Click Score: 19 ? ?  ?End of Session Equipment Utilized During Treatment: Gait belt ?Activity Tolerance: Patient tolerated treatment well ?Patient left: in chair;with call bell/phone within reach;with chair alarm set ?Nurse Communication: Mobility status ?PT Visit Diagnosis: Other abnormalities of gait and mobility (R26.89);Muscle weakness (generalized) (M62.81);Pain ?Pain - Right/Left: Left ?Pain - part of body: Hip ?  ? ? ?Time: 1010-1024 ?PT Time Calculation (min) (ACUTE ONLY): 14 min ? ?Charges:  $Therapeutic Activity: 8-22 mins          ?          ? ?Ina Homes, PT, DPT ?Acute Rehabilitation Services  ?Pager [phone] ?Office [phone] ? ?Malachy Chamber ?10/09/2021, 11:39 AM ? ? ? ?

## 2021-10-09 NOTE — Anesthesia Postprocedure Evaluation (Signed)
Anesthesia Post Note ? ?Patient: Perry Bullock ? ?Procedure(s) Performed: ESOPHAGOGASTRODUODENOSCOPY (EGD) WITH PROPOFOL ?BIOPSY ? ?  ? ?Patient location during evaluation: PACU ?Anesthesia Type: MAC ?Level of consciousness: awake and alert ?Pain management: pain level controlled ?Vital Signs Assessment: post-procedure vital signs reviewed and stable ?Respiratory status: spontaneous breathing, nonlabored ventilation and respiratory function stable ?Cardiovascular status: blood pressure returned to baseline and stable ?Postop Assessment: no apparent nausea or vomiting ?Anesthetic complications: no ? ? ?No notable events documented. ? ?Last Vitals:  ?Vitals:  ? 10/09/21 1033 10/09/21 1137  ?BP: (!) 125/93 93/79  ?Pulse: 90 92  ?Resp: 18 (!) 23  ?Temp: 36.8 ?C 36.5 ?C  ?SpO2: 100% 95%  ?  ?Last Pain:  ?Vitals:  ? 10/09/21 1137  ?TempSrc:   ?PainSc: 0-No pain  ? ? ?  ?  ?  ?  ?  ?  ? ?Jarome Matin Nikol Lemar ? ? ? ? ?

## 2021-10-09 NOTE — Progress Notes (Signed)
Patient transported to ENDO via bed  ?

## 2021-10-10 ENCOUNTER — Encounter (HOSPITAL_COMMUNITY): Payer: Self-pay | Admitting: Gastroenterology

## 2021-10-10 DIAGNOSIS — K56600 Partial intestinal obstruction, unspecified as to cause: Principal | ICD-10-CM

## 2021-10-10 DIAGNOSIS — K209 Esophagitis, unspecified without bleeding: Secondary | ICD-10-CM

## 2021-10-10 DIAGNOSIS — K56609 Unspecified intestinal obstruction, unspecified as to partial versus complete obstruction: Secondary | ICD-10-CM | POA: Diagnosis not present

## 2021-10-10 LAB — MAGNESIUM: Magnesium: 1.4 mg/dL — ABNORMAL LOW (ref 1.7–2.4)

## 2021-10-10 LAB — CBC WITH DIFFERENTIAL/PLATELET
Abs Immature Granulocytes: 0.03 10*3/uL (ref 0.00–0.07)
Basophils Absolute: 0.1 10*3/uL (ref 0.0–0.1)
Basophils Relative: 1 %
Eosinophils Absolute: 0.2 10*3/uL (ref 0.0–0.5)
Eosinophils Relative: 2 %
HCT: 27.7 % — ABNORMAL LOW (ref 39.0–52.0)
Hemoglobin: 9 g/dL — ABNORMAL LOW (ref 13.0–17.0)
Immature Granulocytes: 0 %
Lymphocytes Relative: 18 %
Lymphs Abs: 1.6 10*3/uL (ref 0.7–4.0)
MCH: 33.7 pg (ref 26.0–34.0)
MCHC: 32.5 g/dL (ref 30.0–36.0)
MCV: 103.7 fL — ABNORMAL HIGH (ref 80.0–100.0)
Monocytes Absolute: 0.9 10*3/uL (ref 0.1–1.0)
Monocytes Relative: 11 %
Neutro Abs: 5.8 10*3/uL (ref 1.7–7.7)
Neutrophils Relative %: 68 %
Platelets: 455 10*3/uL — ABNORMAL HIGH (ref 150–400)
RBC: 2.67 MIL/uL — ABNORMAL LOW (ref 4.22–5.81)
RDW: 13.4 % (ref 11.5–15.5)
WBC: 8.5 10*3/uL (ref 4.0–10.5)
nRBC: 0 % (ref 0.0–0.2)

## 2021-10-10 LAB — BASIC METABOLIC PANEL
Anion gap: 7 (ref 5–15)
Anion gap: 13 (ref 5–15)
BUN: 5 mg/dL — ABNORMAL LOW (ref 6–20)
BUN: 6 mg/dL (ref 6–20)
CO2: 22 mmol/L (ref 22–32)
CO2: 19 mmol/L — ABNORMAL LOW (ref 22–32)
Calcium: 8.3 mg/dL — ABNORMAL LOW (ref 8.9–10.3)
Calcium: 8.6 mg/dL — ABNORMAL LOW (ref 8.9–10.3)
Chloride: 105 mmol/L (ref 98–111)
Chloride: 106 mmol/L (ref 98–111)
Creatinine, Ser: 1.03 mg/dL (ref 0.61–1.24)
Creatinine, Ser: 1.05 mg/dL (ref 0.61–1.24)
GFR, Estimated: >60 mL/min (ref >60)
GFR, Estimated: >60 mL/min (ref >60)
Glucose, Bld: 110 mg/dL — ABNORMAL HIGH (ref 70–99)
Glucose, Bld: 107 mg/dL — ABNORMAL HIGH (ref 70–99)
Potassium: 3.7 mmol/L (ref 3.5–5.1)
Potassium: 4.1 mmol/L (ref 3.5–5.1)
Sodium: 134 mmol/L — ABNORMAL LOW (ref 135–145)
Sodium: 138 mmol/L (ref 135–145)

## 2021-10-10 MED ORDER — POLYETHYLENE GLYCOL 3350 17 G PO PACK
17.0000 g | PACK | Freq: Every day | ORAL | Status: DC
  Administered 2021-10-10: 17 g via ORAL
  Filled 2021-10-10: qty 1

## 2021-10-10 MED ORDER — MAGNESIUM SULFATE 2 GM/50ML IV SOLN
2.0000 g | Freq: Once | INTRAVENOUS | Status: AC
  Administered 2021-10-10: 2 g via INTRAVENOUS
  Filled 2021-10-10: qty 50

## 2021-10-10 NOTE — Progress Notes (Signed)
Physical Therapy Treatment ?Patient Details ?Name: Perry Bullock ?MRN: [id number] ?DOB: 1960/12/16 ?Today's Date: 10/10/2021 ? ? ?History of Present Illness Pt is a 61 y.o. male admitted from SNF 10/06/21 with generalized abdominal pain. Workup revealed partial SBO; T8, T10-11 compression fxs, likely chronic. Of note, recent admissions at [facility] 4/3-10/04/21 for syncope with fall, non-operative L acetabular fx with d/c to SNF. Other PMH includes blind R eye, bipolar 1 disorder, gluacoma, Hep C, COPD, DDD, PVD, chronic back pain, asthma, anxiety, arthritis. ? ?  ?PT Comments  ? ? Pt received sitting EOB and agreeable to session with good progress towards goals. Pt requiring grossly min guard throughout for tranfers and ambulation with RW. He continues to demonstrate good ability to maintain TDWB precautions throughout. Pt able to ascend/descend 3 stairs in stairwell with good compliance with WB precautions and fair stability with mod assist for cues and steadying. Pt continues to benefit from skilled PT services to progress toward functional mobility goals.  ?  ?Recommendations for follow up therapy are one component of a multi-disciplinary discharge planning process, led by the attending physician.  Recommendations may be updated based on patient status, additional functional criteria and insurance authorization. ? ?Follow Up Recommendations ? Home health PT ?  ?  ?Assistance Recommended at Discharge Intermittent Supervision/Assistance  ?Patient can return home with the following A little help with walking and/or transfers;A little help with bathing/dressing/bathroom;Assistance with cooking/housework;Assist for transportation;Help with stairs or ramp for entrance ?  ?Equipment Recommendations ? None recommended by PT  ?  ?Recommendations for Other Services   ? ? ?  ?Precautions / Restrictions Precautions ?Precautions: Fall ?Restrictions ?Weight Bearing Restrictions: Yes ?LLE Weight Bearing: Touchdown weight bearing ?Other  Position/Activity Restrictions: Recent L acetabular fx (non-operative management)  ?  ? ?Mobility ? Bed Mobility ?Overal bed mobility: Modified Independent ?  ?  ?  ?  ?  ?  ?General bed mobility comments: pt sitting EOB on arrival ?  ? ?Transfers ?Overall transfer level: Needs assistance ?Equipment used: Rolling walker (2 wheels) ?Transfers: Sit to/from Stand ?Sit to Stand: Supervision ?  ?  ?  ?  ?  ?General transfer comment: good hand placement and sequencing; supervision for safety ?  ? ?Ambulation/Gait ?Ambulation/Gait assistance: Min guard ?Gait Distance (Feet): 75 Feet ?Assistive device: Rolling walker (2 wheels) ?Gait Pattern/deviations: Step-to pattern ?Gait velocity: Decreased ?  ?  ?General Gait Details: antalgic gait with RW and intermittent min guard for balance; good compliacne with WB precautions, cues for pacing. x3 short standing rest breaks ? ? ?Stairs ?Stairs: Yes ?Stairs assistance: Mod assist ?Stair Management: One rail Right, Step to pattern, Forwards, With walker ?Number of Stairs: 3 ?General stair comments: ascending/descending 3 stairs in stairwell with right rail and RW turned sideways on L. good ability to maintain WB precautions. no LOB ? ? ?Wheelchair Mobility ?  ? ?Modified Rankin (Stroke Patients Only) ?  ? ? ?  ?Balance Overall balance assessment: Needs assistance ?Sitting-balance support: No upper extremity supported ?Sitting balance-Leahy Scale: Good ?Sitting balance - Comments: indep with posterior pericare sitting on toilet ?  ?Standing balance support: Single extremity supported, Bilateral upper extremity supported, During functional activity ?Standing balance-Leahy Scale: Poor ?Standing balance comment: reliant on RW ?  ?  ?  ?  ?  ?  ?  ?  ?  ?  ?  ?  ? ?  ?Cognition Arousal/Alertness: Awake/alert ?Behavior During Therapy: [facility] for tasks assessed/performed ?Overall Cognitive Status: Within Functional Limits for tasks assessed ?  ?  ?  ?  ?  ?  ?  ?  ?  ?  ?  ?  ?  ?  ?  ?   ?  General Comments: WFL for simple tasks; requires some redirection in conversation; good ability to problem solve through safe mobility at home ?  ?  ? ?  ?Exercises   ? ?  ?General Comments   ?  ?  ? ?Pertinent Vitals/Pain Pain Assessment ?Pain Assessment: Faces ?Faces Pain Scale: Hurts little more ?Pain Location: left hip ?Pain Descriptors / Indicators: Sore ?Pain Intervention(s): Limited activity within patient's tolerance, Monitored during session, Repositioned  ? ? ?Home Living   ?  ?  ?  ?  ?  ?  ?  ?  ?  ?   ?  ?Prior Function    ?  ?  ?   ? ?PT Goals (current goals can now be found in the care plan section) Acute Rehab PT Goals ?PT Goal Formulation: With patient ?Time For Goal Achievement: 10/14/21 ? ?  ?Frequency ? ? ? Min 5X/week ? ? ? ?  ?PT Plan Current plan remains appropriate  ? ? ?Co-evaluation   ?  ?  ?  ?  ? ?  ?AM-PAC PT "6 Clicks" Mobility   ?Outcome Measure ? Help needed turning from your back to your side while in a flat bed without using bedrails?: None ?Help needed moving from lying on your back to sitting on the side of a flat bed without using bedrails?: None ?Help needed moving to and from a bed to a chair (including a wheelchair)?: A Little ?Help needed standing up from a chair using your arms (e.g., wheelchair or bedside chair)?: A Little ?Help needed to walk in hospital room?: A Little ?Help needed climbing 3-5 steps with a railing? : A Lot ?6 Click Score: 19 ? ?  ?End of Session Equipment Utilized During Treatment: Gait belt ?Activity Tolerance: Patient tolerated treatment well ?Patient left: in chair;with call bell/phone within reach;with chair alarm set;with nursing/sitter in room ?Nurse Communication: Mobility status ?PT Visit Diagnosis: Other abnormalities of gait and mobility (R26.89);Muscle weakness (generalized) (M62.81);Pain ?Pain - Right/Left: Left ?Pain - part of body: Hip ?  ? ? ?Time: 5053-9767 ?PT Time Calculation (min) (ACUTE ONLY): 25 min ? ?Charges:  $Gait Training:  23-37 mins          ?          ?Lenora Boys. PTA ?Acute Rehabilitation Services ?Office: [phone] ? ? ? ?Marlana Salvage Dalaina Tates ?10/10/2021, 10:40 AM ? ?

## 2021-10-10 NOTE — Plan of Care (Signed)
  Problem: Nutrition: Goal: Adequate nutrition will be maintained Outcome: Progressing   Problem: Pain Managment: Goal: General experience of comfort will improve Outcome: Progressing   Problem: Safety: Goal: Ability to remain free from injury will improve Outcome: Progressing   

## 2021-10-10 NOTE — TOC Progression Note (Signed)
Transition of Care (TOC) - Progression Note  ? ? ?Patient Details  ?Name: Perry Bullock ?MRN: [id number] ?Date of Birth: 22-Oct-1960 ? ?Transition of Care (TOC) CM/SW Contact  ?Verdell Carmine, RN ?Phone Number: ?10/10/2021, 12:33 PM ? ?Clinical Narrative:    ? ?[facility] cannot accept patient for [facility] ? ? ?Expected Discharge Plan: [place] ?  ? ?Expected Discharge Plan and Services ?Expected Discharge Plan: [place] ?  ?Discharge Planning Services: CM Consult ?Post Acute Care Choice: Home Health ?Living arrangements for the past 2 months: [place] ?                ?DME Arranged: N/A ?  ?  ?  ?  ?HH Arranged: PT, OT ?  ?  ?  ?  ? ? ?Social Determinants of Health (SDOH) Interventions ?  ? ?Readmission Risk Interventions ?   ? View : No data to display.  ?  ?  ?  ? ? ?

## 2021-10-10 NOTE — Progress Notes (Signed)
Mobility Specialist Progress Note: ? ? 10/10/21 1500  ?Mobility  ?Activity Ambulated with assistance in hallway  ?Level of Assistance Standby assist, set-up cues, supervision of patient - no hands on  ?Assistive Device Front wheel walker  ?LLE Weight Bearing TWB  ?Distance Ambulated (ft) 150 ft  ?Activity Response Tolerated well  ?$Mobility charge 1 Mobility  ? ?Pt received in bed willing to participate in mobility. Complaints of 8/10 L hip pain. Required multiple standing breaks d/t feeling SOB. Left in chair with call bell in reach and all needs met.  ? ?Perry Bullock ?Mobility Specialist ?Primary Phone [phone] ? ?

## 2021-10-10 NOTE — Progress Notes (Addendum)
? ? ? ?[facility] Progress Note ? ?CC:  Small bowel obstruction vs ileus, hematemesis ? ?Subjective: He is sitting up in the chair, no BM today but passing gas per the rectum.  He feels a little bloated. No significant abdominal pain. No N/V. He is tolerating a solid diet. He ambulated in the hall and in his room earlier today.  ? ?Objective:  ?Vital signs in last 24 hours: ?Temp:  [98.4 ?F (36.9 ?C)-98.9 ?F (37.2 ?C)] 98.4 ?F (36.9 ?C) (04/13 0802) ?Pulse Rate:  [97-105] 100 (04/13 0802) ?Resp:  [16-20] 18 (04/13 0802) ?BP: (104-139)/(79-95) 108/85 (04/13 0802) ?SpO2:  [93 %-100 %] 95 % (04/13 0823) ?Last BM Date : 10/09/21 ? ?General: 61 year old male in NAD.  ?Heart: Mildly tachycardic, normal rhythm.  ?Pulm: Decreased breath sounds throughout.  ?Abdomen: Protuberant, soft, mild tenderness above and below the umbilicus without rebound or guarding.  ?Extremities:  Bilateral lower extremities with 1 to 2 +  pitting edema.  ?Neurologic:  Alert and  oriented x 4. Speech is clear. Moves all extremities.  ?Psych:  Alert and cooperative. Normal mood and affect. ? ?Intake/Output from previous day: ?04/12 0701 - 04/13 0700 ?In: 864 [P.O.:240; I.V.:300; IV Piggyback:324] ?Out: -  ?Intake/Output this shift: ?Total I/O ?In: -  ?Out: 1400 [Urine:1400] ? ?Lab Results: ?Recent Labs  ?  10/08/21 ?[id number] 10/09/21 ?0225 10/10/21 ?0105  ?WBC 6.8 7.3 8.5  ?HGB 7.4* 7.7* 9.0*  ?HCT 22.7* 23.0* 27.7*  ?PLT 359 355 455*  ? ?BMET ?Recent Labs  ?  10/09/21 ?0225 10/10/21 ?0105 10/10/21 ?1118  ?NA 139 134* 138  ?K 3.4* 3.7 4.1  ?CL 113* 105 106  ?CO2 22 22 19*  ?GLUCOSE 84 110* 107*  ?BUN 6 5* 6  ?CREATININE 0.70 1.03 1.05  ?CALCIUM 7.6* 8.3* 8.6*  ? ?LFT ?Recent Labs  ?  10/09/21 ?0225  ?PROT 4.9*  ?ALBUMIN 2.3*  ?AST 23  ?ALT 21  ?ALKPHOS 84  ?BILITOT 0.5  ? ?PT/INR ?No results for input(s): LABPROT, INR in the last 72 hours. ?Hepatitis Panel ?No results for input(s): HEPBSAG, HCVAB, HEPAIGM, HEPBIGM in the last 72  hours. ? ?DG Abd Portable 1V-Small Bowel Obstruction Protocol-initial, 8 hr delay ? ?Result Date: 10/09/2021 ?CLINICAL DATA:  Small-bowel obstruction EXAM: PORTABLE ABDOMEN - 1 VIEW COMPARISON:  10/08/2021 FINDINGS: Administered contrast now lies almost entirely within the colon consistent with partial small bowel obstruction. The degree of small-bowel dilatation is stable from the prior study. Postsurgical changes in the proximal femurs are noted. IMPRESSION: Contrast material within the colon consistent with a partial small bowel obstruction. Degree of small-bowel dilatation is stable. Electronically Signed   By: Inez Catalina M.D.   On: 10/09/2021 02:54   ? ?Assessment / Plan: ? ?60) 61 year old male admitted to the hospital with hematemesis and abdominal distention.  CTAP showed a possible partial small obstruction.  Patient was evaluated by general surgery who thought he most likely had a reactive ileus secondary to his recent hospital admission for syncope, left hip fracture on pain meds.  He received Gastrografin per SBO protocol, follow-up x-ray showed contrast in the colon. No BM today, passing gas per the rectum. Received Dulcolax and Miralax earlier today. ?-Ambulate in hall as tolerated  ?-Repeat abdominal xray in am  ?-Miralax and Dulcolax suppositories as needed  ?-Limit opioid use ?-Possible discharge home tomorrow ? ?2) Hematemesis. S/P EGD 10/09/2021 identified grade D reflux esophagitis, likely source of GI bleeding, a 6 cm hiatal  hernia and enlarged gastric folds were and a nonbleeding duodenal diverticulum was noted. ?-Continue Protonix 40 mg p.o. twice daily for 8 weeks to heal esophagitis then once daily ?-Repeat EGD in 8 weeks to assess for esophagitis healing, our office will contact patient to facilitate follow up and repeat EGD ?-Await pathology results ?  ?3) Anemia, multifactorial: Anemia of chronic disease, hematemesis. Stable hemoglobin 9.0.  No further hematemesis since admission. Received  Feraheme IV x 1.  ?  ?4) COPD on 2 to 3 L [place] ? ?5) Left acetabular wall fracture with disruption of the ilioischial line consistent with acute posterior column fracture.  Conservative management. On Oxycodone 5mg  po Q 4hrs PRN. ? ? ?  ? ? ? ? ? ?Principal Problem: ?  SBO (small bowel obstruction) ([place]) ?Active Problems: ?  COPD (chronic obstructive pulmonary disease) ([place]) ?  Bipolar 1 disorder ([place]) ?  Acetabular fracture ([place]) ?  Sepsis due to gram-negative UTI ([place]) ?  Seizure disorder ([place]) ?  Severe sepsis ([place]) ?  Macrocytic anemia ?  Upper GI bleed ?  Acute esophagitis ? ? ? ? LOS: 4 days  ? ?Patrecia Pour Kennedy-Smith  10/10/2021, 2:00 PM ? ?I have taken a history, reviewed the chart and examined the patient. I performed a substantive portion of this encounter, including complete performance of at least one of the key components, in conjunction with the APP. I agree with the APP's note, impression and recommendations ? ?Patient doing very well today, tolerating a regular diet without any nausea or vomiting.  His abdominal exam was very reassuring today.  Although his abdomen is slightly distended, he was nontender and had normal bowel sounds, with no tingling or high-pitched bowel sounds.   ? ?Suspect patient had an ileus rather than a partial small bowel obstruction, but if he has persistent dilated small bowel loops, further evaluation (enterography or small bowel follow-through) may be indicated. ? ?Would recommend increasing MiraLAX to 2-3 times per day, if patient does not have a bowel movement today. ?We will follow-up with patient in clinic to discuss repeat upper endoscopy in reassess ileus/obstruction symptoms and discuss colonoscopy. ? ?Tasha Jindra E. Candis Schatz, MD ?[facility] ? ? ?

## 2021-10-10 NOTE — Progress Notes (Signed)
Occupational Therapy Treatment ?Patient Details ?Name: Perry Bullock ?MRN: [name] ?DOB: 11-05-1960 ?Today's Date: 10/10/2021 ? ? ?History of present illness Pt is a 61 y.o. male admitted from SNF 10/06/21 with generalized abdominal pain. Workup revealed partial SBO; T8, T10-11 compression fxs, likely chronic. Of note, recent admissions at [facility] 4/3-10/04/21 for syncope with fall, non-operative L acetabular fx with d/c to SNF. Other PMH includes blind R eye, bipolar 1 disorder, gluacoma, Hep C, COPD, DDD, PVD, chronic back pain, asthma, anxiety, arthritis. ?  ?OT comments ? Pt progressing with goals as able to complete mobility with FW to complete toilet tasks in the bathroom with min guard and BSC over toilet. Pt at this time was able to increase in ability to complete BLE ADLS but due to lower abdominal pain and hip pain educated about AE to use. Pt currently with functional limitations due to the deficits listed below (see OT Problem List).  Pt will benefit from skilled OT to increase their safety and independence with ADL and functional mobility for ADL to facilitate discharge to venue listed below.  ?  ? ?Recommendations for follow up therapy are one component of a multi-disciplinary discharge planning process, led by the attending physician.  Recommendations may be updated based on patient status, additional functional criteria and insurance authorization. ?   ?Follow Up Recommendations ? Home health OT  ?  ?Assistance Recommended at Discharge Frequent or constant Supervision/Assistance  ?Patient can return home with the following ? A little help with walking and/or transfers;A lot of help with bathing/dressing/bathroom;Assistance with cooking/housework;Assist for transportation;Help with stairs or ramp for entrance ?  ?Equipment Recommendations ?    ?  ?Recommendations for Other Services   ? ?  ?Precautions / Restrictions Precautions ?Precautions: Fall ?Restrictions ?Weight Bearing Restrictions: Yes ?LLE Weight  Bearing: Touchdown weight bearing ?Other Position/Activity Restrictions: Recent L acetabular fx (non-operative management)  ? ? ?  ? ?Mobility Bed Mobility ?  ?  ?  ?  ?  ?  ?  ?General bed mobility comments: presented sitting in chair ?  ? ?Transfers ?Overall transfer level: Needs assistance ?Equipment used: Rolling walker (2 wheels) ?Transfers: Sit to/from Stand ?Sit to Stand: Supervision ?  ?  ?  ?  ?  ?General transfer comment: cued for placement as noted to attempt to reach back prior to backing up to chair ?  ?  ?Balance Overall balance assessment: Needs assistance ?Sitting-balance support: No upper extremity supported ?Sitting balance-Leahy Scale: Good ?  ?  ?Standing balance support: Single extremity supported, Bilateral upper extremity supported, During functional activity ?Standing balance-Leahy Scale: Poor ?  ?  ?  ?  ?  ?  ?  ?  ?  ?  ?  ?  ?   ? ?ADL either performed or assessed with clinical judgement  ? ?ADL Overall ADL's : Needs assistance/impaired ?Eating/Feeding: Set up;Sitting ?  ?Grooming: Set up;Sitting;Standing ?  ?Upper Body Bathing: Set up;Sitting ?  ?Lower Body Bathing: Minimal assistance;Cueing for safety;Cueing for sequencing;Sit to/from stand ?  ?Upper Body Dressing : Minimal assistance;Sitting ?  ?Lower Body Dressing: Minimal assistance;Sit to/from stand ?  ?Toilet Transfer: Min guard;Rolling walker (2 wheels) ?  ?Toileting- Clothing Manipulation and Hygiene: Min guard;Cueing for safety;Cueing for sequencing;Sit to/from stand ?  ?  ?  ?Functional mobility during ADLs: Min guard;Rolling walker (2 wheels) ?  ?  ? ?Extremity/Trunk Assessment Upper Extremity Assessment ?Upper Extremity Assessment: Overall WFL for tasks assessed ?  ?Lower Extremity Assessment ?Lower Extremity Assessment:  Defer to PT evaluation ?  ?  ?  ? ?Vision   ?Additional Comments: at baseline, blind in R eye ?  ?Perception   ?  ?Praxis   ?  ? ?Cognition Arousal/Alertness: Awake/alert ?Behavior During Therapy: [facility] for  tasks assessed/performed ?Overall Cognitive Status: Within Functional Limits for tasks assessed ?  ?  ?  ?  ?  ?  ?  ?  ?  ?  ?  ?  ?  ?  ?  ?  ?General Comments: WFL for simple tasks noted cues for saftey over threshold to bathroom ?  ?  ?   ?Exercises   ? ?  ?Shoulder Instructions   ? ? ?  ?General Comments    ? ? ?Pertinent Vitals/ Pain       Pain Assessment ?Pain Assessment: Faces ?Faces Pain Scale: Hurts little more ?Pain Location: left hip ?Pain Descriptors / Indicators: Sore ?Pain Intervention(s): Limited activity within patient's tolerance, Monitored during session, Repositioned ? ?Home Living   ?  ?  ?  ?  ?  ?  ?  ?  ?  ?  ?  ?  ?  ?  ?  ?  ?  ?  ? ?  ?Prior Functioning/Environment    ?  ?  ?  ?   ? ?Frequency ? Min 3X/week  ? ? ? ? ?  ?Progress Toward Goals ? ?OT Goals(current goals can now be found in the care plan section) ? Progress towards OT goals: Progressing toward goals ? ?Acute Rehab OT Goals ?Patient Stated Goal: to go home tomorrow ?OT Goal Formulation: With patient ?Time For Goal Achievement: 10/22/21 ?Potential to Achieve Goals: Good ?ADL Goals ?Pt Will Perform Grooming: with supervision;standing;sitting ?Pt Will Perform Upper Body Dressing: with supervision;sitting ?Pt Will Perform Lower Body Dressing: with min assist;sit to/from stand;sitting/lateral leans;with adaptive equipment ?Pt Will Transfer to Toilet: with supervision;regular height toilet;ambulating ?Pt Will Perform Tub/Shower Transfer: Shower transfer;Tub transfer;ambulating;shower seat;with min assist ?Additional ADL Goal #1: Pt will complete seated bathing with supervision for TTB transfer and remote supervision for safety, maintaining LLE TTWBing. ?Additional ADL Goal #2: Pt will verbalize plan to implement at least 2 learned falls prevention strategies to maximize safety/indep with ADL/mobility at home.  ?Plan Discharge plan remains appropriate   ? ?Co-evaluation ? ? ?   ?  ?  ?  ?  ? ?  ?AM-PAC OT "6 Clicks" Daily Activity      ?Outcome Measure ? ? Help from another person eating meals?: None ?Help from another person taking care of personal grooming?: A Little ?Help from another person toileting, which includes using toliet, bedpan, or urinal?: A Little ?Help from another person bathing (including washing, rinsing, drying)?: A Little ?Help from another person to put on and taking off regular upper body clothing?: A Little ?Help from another person to put on and taking off regular lower body clothing?: A Little ?6 Click Score: 19 ? ?  ?End of Session Equipment Utilized During Treatment: Rolling walker (2 wheels) ? ?OT Visit Diagnosis: Muscle weakness (generalized) (M62.81);Pain;Repeated falls (R29.6);Unsteadiness on feet (R26.81);History of falling (Z91.81) ?Pain - Right/Left: Left ?Pain - part of body: Hip ?  ?Activity Tolerance Patient tolerated treatment well ?  ?Patient Left with call bell/phone within reach;in chair;with chair alarm set ?  ?Nurse Communication   ?  ? ?   ? ?Time: 1610-9604 ?OT Time Calculation (min): 30 min ? ?Charges: OT General Charges ?$OT Visit: 1 Visit ?OT Treatments ?$Self  Care/Home Management : 23-37 mins ? ?Alphia Moh OTR/L  ?Acute Rehab Services  ?[phone] office number ?[phone] pager number ? ? ?Alphia Moh ?10/10/2021, 12:01 PM ?

## 2021-10-10 NOTE — Progress Notes (Signed)
?PROGRESS NOTE ? ?Perry Bullock  [id number] DOB: [birth year] DOA: 10/06/2021 ?PCP: Center, [facility]  ? ?Brief Narrative: ? ?Patient is a 61 year old male with history of COPD, asthma, bipolar disorder, osteoarthritis, anxiety, polysubstance/alcohol/tobacco abuse, seizure disorder, grade 1 diastolic dysfunction, hepatitis C who presented to the emergency department with abdominal pain, recurrent nausea/vomiting, coffee-ground emesis.  He was recently admitted at [facility] hospital from 4/3 - 4/7 for syncopal, left acetabular fracture which was nonoperatively managed.  On presentation, abdomen/pelvis CT scan showed partial SBO with no exact transition point.  General surgery was consulted.  General surgery thought that patient abdomen symptoms are from reactive ileus rather than true obstruction so did not recommend NG tube placement, he was on clear liquid diet.  GI also consulted for coffee-ground emesis.  Underwent EGD with finding of grade D esophagitis, started on Protonix.  Overall condition getting better but he is still volume overloaded needing IV Lasix.  Plan for discharge home tomorrow with home health ? ?Assessment & Plan: ? ?Principal Problem: ?  SBO (small bowel obstruction) ([place]) ?Active Problems: ?  Acetabular fracture ([place]) ?  Sepsis due to gram-negative UTI ([place]) ?  Severe sepsis ([place]) ?  COPD (chronic obstructive pulmonary disease) ([place]) ?  Bipolar 1 disorder ([place]) ?  Seizure disorder ([place]) ?  Macrocytic anemia ?  Upper GI bleed ?  Acute esophagitis ? ?SBO versus ileus: Presented with abdominal pain, nausea, vomiting. On presentation, abdomen/pelvis CT scan showed partial SBO with no exact transition point.  General surgery was consulted.  General surgery thought that patient abdomen symptoms are from reactive ileus rather than true obstruction so did not recommend NG tube placement. ?Abdominal x-ray done on 4/12 showed contrast in the colon.  General surgery cleared for advancing the  diet.  Had bowel movement on 4/12.  We will continue bowel regimen on discharge ? ?Coffee-ground emesis/upper GI bleed/acute blood loss anemia: Hemoglobin dropped to the range of 7 from 9.  Iron studies showed low iron.  Given a dose of IV iron  infusion . ?GI consulted. EGD showed grade D esophagitis.  GI recommending to continue PPI twice daily for 8 weeks. ? He needs to continue iron supplementation . ? ?Severe volume overload: Patient looks volume overloaded with bilateral  crackles, scrotal edema, lower extremity edema.  This is most likely secondary to aggressive IV fluid resuscitation for SBO.  Stopped IV fluids.  Started on Lasix 40 mg twice daily. ? ?Sepsis/gram-negative UTI: Presented with tachycardia, tachypnea, elevated lactate level.  UA was suggestive of UTI.  Urine culture has not shown any growth.  Antibiotics will be discontinued after 5 days of treatment. ? ?Recent acetabular fracture: Recently admitted at [facility] hospital for the same.  Managed conservatively.  He needs to follow-up with orthopedics as an outpatient.  Patient was discharged to a SNF for rehabilitation.  Continue pain management, supportive care. ?PT/OT recommended home health on discharge. ? ?COPD: Continue inhalers, bronchodilators.  Currently on 2 L, will try to wean the oxygen.  Not on oxygen at home. ? ?Tobacco use: Counseled for cessation.  Continue nicotine patch ? ?Seizure disorder: On Neurontin ? ?Bipolar 1 disorder: On Seroquel, trazodone ? ?Hypokalemia/hypomagnesemia: Supplemented with potassium/magnesium ? ? ?  ?  ? ?DVT prophylaxis:SCD ? ? ?  Code Status: Full Code ? ?Family Communication: None at bedside ? ?Patient status:Inpatient ? ?Patient is from :Home ? ?Anticipated discharge [id number] ? ?Estimated DC date:tomorrow ? ? ?Consultants: Surgery, GI ? ?Procedures: EGD ? ?Antimicrobials:  ?  Anti-infectives (From admission, onward)  ? ? Start     Dose/Rate Route Frequency Ordered Stop  ? 10/09/21 1800  cefTRIAXone (ROCEPHIN)  1 g in sodium chloride 0.9 % 100 mL IVPB       ? 1 g ?200 mL/hr over 30 Minutes Intravenous Every 24 hours 10/09/21 0759 10/12/21 1759  ? 10/07/21 1800  cefTRIAXone (ROCEPHIN) 2 g in sodium chloride 0.9 % 100 mL IVPB  Status:  Discontinued       ? 2 g ?200 mL/hr over 30 Minutes Intravenous Every 24 hours 10/06/21 2108 10/09/21 0759  ? 10/06/21 1745  cefTRIAXone (ROCEPHIN) 2 g in sodium chloride 0.9 % 100 mL IVPB       ? 2 g ?200 mL/hr over 30 Minutes Intravenous  Once 10/06/21 1733 10/06/21 1840  ? 10/06/21 1745  metroNIDAZOLE (FLAGYL) IVPB 500 mg       ? 500 mg ?100 mL/hr over 60 Minutes Intravenous  Once 10/06/21 1733 10/06/21 1917  ? ?  ? ? ?Subjective: ?Patient seen and examined at the bedside this morning.  Overall comfortable.  Complains of lack of bowel movement today, mild bowel distention.  No bowel movement today.  Bilateral lower extremity edema is stable present but significant improvement in the scrotal edema. ? ?Objective: ?Vitals:  ? 10/10/21 0510 10/10/21 0802 10/10/21 [id number] 10/10/21 [id number]  ?BP: 104/79 108/85    ?Pulse: (!) 105 100    ?Resp: 16 18    ?Temp: 98.7 ?F (37.1 ?C) 98.4 ?F (36.9 ?C)    ?TempSrc: Oral Oral    ?SpO2: 100% 93% 95% 95%  ?Weight:      ?Height:      ? ? ?Intake/Output Summary (Last 24 hours) at 10/10/2021 1057 ?Last data filed at 10/10/2021 1000 ?Gross per 24 hour  ?Intake 864.04 ml  ?Output 1400 ml  ?Net -535.96 ml  ? ?Filed Weights  ? 10/06/21 1726 10/09/21 1033  ?Weight: 70.3 kg 70.3 kg  ? ? ?Examination: ? ?General exam: Overall comfortable, not in distress, very deconditioned ?HEENT: PERRL ?Respiratory system: Diminished air sounds bilaterally, mild expiratory wheezing ?Cardiovascular system: S1 & S2 heard, RRR.  ?Gastrointestinal system: Abdomen is nondistended, soft and nontender. ?Central nervous system: Alert and oriented ?Extremities: 2+ edema bilaterally on the feet, no clubbing ,no cyanosis ?Skin: No rashes, no ulcers,no icterus   ? ? ?Data Reviewed: I have personally  reviewed following labs and imaging studies ? ?CBC: ?Recent Labs  ?Lab 10/06/21 ?1835 10/07/21 ?[id number] 10/08/21 ?[id number] 10/09/21 ?0225 10/10/21 ?0105  ?WBC 10.2 8.6 6.8 7.3 8.5  ?NEUTROABS 7.5  --  4.3 4.6 5.8  ?HGB 9.5* 8.1* 7.4* 7.7* 9.0*  ?HCT 29.2* 25.3* 22.7* 23.0* 27.7*  ?MCV 105.0* 105.9* 105.1* 105.0* 103.7*  ?PLT 427* 365 359 355 455*  ? ?Basic Metabolic Panel: ?Recent Labs  ?Lab 10/06/21 ?1733 10/07/21 ?[id number] 10/08/21 ?[id number] 10/09/21 ?0225 10/10/21 ?0105  ?NA 134* 137 139 139 134*  ?K 5.0 3.9 3.5 3.4* 3.7  ?CL 91* 103 110 113* 105  ?CO2 28 27 22 22 22   ?GLUCOSE 135* 95 79 84 110*  ?BUN 31* 21* 11 6 5*  ?CREATININE 1.14 0.91 0.77 0.70 1.03  ?CALCIUM 8.9 7.7* 7.7* 7.6* 8.3*  ?MG  --   --  1.4* 1.9 1.4*  ?PHOS  --   --  3.2 3.1  --   ? ? ? ?Recent Results (from the past 240 hour(s))  ?Urine Culture     Status: Abnormal  ? Collection Time: 09/30/21  5:54 PM  ? Specimen: Urine, Random  ?Result Value Ref Range Status  ? Specimen Description   Final  ?  URINE, RANDOM ?Performed at [facility], [address], [place], [place] [address] ?  ? Special Requests   Final  ?  NONE ?Performed at [facility], [address] [place], [place] [address] ?  ? Culture >=100,000 COLONIES/mL ESCHERICHIA COLI (A)  Final  ? Report Status 10/03/2021 FINAL  Final  ? Organism ID, Bacteria ESCHERICHIA COLI (A)  Final  ?    Susceptibility  ? Escherichia coli - MIC*  ?  AMPICILLIN >=32 RESISTANT Resistant   ?  CEFAZOLIN <=4 SENSITIVE Sensitive   ?  CEFEPIME <=0.12 SENSITIVE Sensitive   ?  CEFTRIAXONE <=0.25 SENSITIVE Sensitive   ?  CIPROFLOXACIN 1 RESISTANT Resistant   ?  GENTAMICIN <=1 SENSITIVE Sensitive   ?  IMIPENEM <=0.25 SENSITIVE Sensitive   ?  NITROFURANTOIN <=16 SENSITIVE Sensitive   ?  TRIMETH/SULFA >=320 RESISTANT Resistant   ?  AMPICILLIN/SULBACTAM >=32 RESISTANT Resistant   ?  PIP/TAZO <=4 SENSITIVE Sensitive   ?  * >=100,000 COLONIES/mL ESCHERICHIA COLI  ?Urine Culture     Status: None  ? Collection Time:  10/06/21  5:33 PM  ? Specimen: In/Out Cath Urine  ?Result Value Ref Range Status  ? Specimen Description IN/OUT CATH URINE  Final  ? Special Requests NONE  Final  ? Culture   Final  ?  NO GROWTH ?Performed

## 2021-10-11 ENCOUNTER — Inpatient Hospital Stay (HOSPITAL_COMMUNITY): Payer: Medicaid Other

## 2021-10-11 ENCOUNTER — Telehealth: Payer: Self-pay

## 2021-10-11 DIAGNOSIS — K56609 Unspecified intestinal obstruction, unspecified as to partial versus complete obstruction: Secondary | ICD-10-CM | POA: Diagnosis not present

## 2021-10-11 LAB — BASIC METABOLIC PANEL
Anion gap: 12 (ref 5–15)
BUN: 9 mg/dL (ref 6–20)
CO2: 22 mmol/L (ref 22–32)
Calcium: 8.7 mg/dL — ABNORMAL LOW (ref 8.9–10.3)
Chloride: 99 mmol/L (ref 98–111)
Creatinine, Ser: 0.96 mg/dL (ref 0.61–1.24)
GFR, Estimated: >60 mL/min (ref >60)
Glucose, Bld: 93 mg/dL (ref 70–99)
Potassium: 4.8 mmol/L (ref 3.5–5.1)
Sodium: 133 mmol/L — ABNORMAL LOW (ref 135–145)

## 2021-10-11 LAB — CULTURE, BLOOD (ROUTINE X 2)
Culture: NO GROWTH
Culture: NO GROWTH
Special Requests: ADEQUATE

## 2021-10-11 LAB — SURGICAL PATHOLOGY

## 2021-10-11 MED ORDER — FERROUS SULFATE 325 (65 FE) MG PO TABS: 325.0000 mg | ORAL_TABLET | Freq: Every day | ORAL | 1 refills | Status: DC

## 2021-10-11 MED ORDER — FUROSEMIDE 40 MG PO TABS: 40.0000 mg | ORAL_TABLET | Freq: Every day | ORAL | 0 refills | Status: DC

## 2021-10-11 MED ORDER — PANTOPRAZOLE SODIUM 40 MG PO TBEC: 40.0000 mg | DELAYED_RELEASE_TABLET | Freq: Two times a day (BID) | ORAL | 0 refills | Status: AC

## 2021-10-11 MED ORDER — POLYETHYLENE GLYCOL 3350 17 G PO PACK
17.0000 g | PACK | Freq: Two times a day (BID) | ORAL | Status: DC
  Administered 2021-10-11: 17 g via ORAL
  Filled 2021-10-11: qty 1

## 2021-10-11 MED ORDER — POLYETHYLENE GLYCOL 3350 17 G PO PACK: 17.0000 g | PACK | Freq: Two times a day (BID) | ORAL | 0 refills | Status: AC

## 2021-10-11 NOTE — Telephone Encounter (Signed)
-----   Message from Jenel Lucks, MD sent at 10/09/2021  4:30 PM EDT ----- ?Regarding: Repeat EGD in 8 weeks ?Bonita Quin,  ?Can you schedule a repeat EGD for Mr. Ono in about 8 weeks? (follow up of esophagitis).  He does not need an office visit, but if he wants one before, please book ? ?

## 2021-10-11 NOTE — Progress Notes (Signed)
Pt discharged home with wife in stable condition 

## 2021-10-11 NOTE — Telephone Encounter (Signed)
Pt scheduled for EGD in the [facility] 6/6@10am . Pt still admitted. Check and see when discharged, pt will need amb referral and instructions. ?

## 2021-10-11 NOTE — Discharge Summary (Signed)
Physician Discharge Summary  ?Perry Bullock [id number] DOB: Aug 29, 1960 DOA: 10/06/2021 ? ?PCP: [facility], [facility] ? ?Admit date: 10/06/2021 ?Discharge date: 10/11/2021 ? ?Admitted From: Home ?Disposition:  Home ? ?Discharge Condition:Stable ?CODE STATUS:FULL ?Diet recommendation:  Regular ? ?Brief/Interim Summary: ? ?Patient is a 61 year old male with history of COPD, asthma, bipolar disorder, osteoarthritis, anxiety, polysubstance/alcohol/tobacco abuse, seizure disorder, grade 1 diastolic dysfunction, hepatitis C who presented to the emergency department with abdominal pain, recurrent nausea/vomiting, coffee-ground emesis.  He was recently admitted at [facility] hospital from 4/3 - 4/7 for syncopal, left acetabular fracture which was nonoperatively managed.  On presentation, abdomen/pelvis CT scan showed partial SBO with no exact transition point.  General surgery was consulted.  General surgery thought that patient abdomen symptoms are from reactive ileus rather than true obstruction so did not recommend NG tube placement, he was on clear liquid diet.  GI also consulted for coffee-ground emesis.  Underwent EGD with finding of grade D esophagitis, started on Protonix.  Overall condition getting better but he is still volume overloaded needing IV Lasix.  Plan for discharge home today. ? ? ?Following problems were addressed during his hospitalization: ? ?SBO versus ileus: Presented with abdominal pain, nausea, vomiting. On presentation, abdomen/pelvis CT scan showed partial SBO with no exact transition point.  General surgery was consulted.  General surgery thought that patient abdomen symptoms are from reactive ileus rather than true obstruction so did not recommend NG tube placement. ?Abdominal x-ray done on 4/12 showed contrast in the colon.  General surgery cleared for advancing the diet.  Had bowel movement on 4/12 and a small one on 4/13.  We will continue bowel regimen on discharge.  Abdomen is  nondistended, has good bowel sounds. ?  ?Coffee-ground emesis/upper GI bleed/acute blood loss anemia: Hemoglobin dropped to the range of 7 from 9.  Iron studies showed low iron.  Given a dose of IV iron  infusion . ?GI consulted. EGD showed grade D esophagitis, sick centimeter hiatal hernia, enlarged gastric folds, nonbleeding duodenal diverticulum.  GI recommending to continue PPI twice daily for 8 weeks.  He will be called by gastroenterology for follow-up appointment for repeat EGD. ? He needs to continue iron supplementation . ?  ?Severe volume overload: Patient looked volume overloaded with bilateral  crackles, scrotal edema, lower extremity edema.  This is most likely secondary to aggressive IV fluid resuscitation for SBO.  Stopped IV fluids.  Started on Lasix 40 mg IV twice daily with improvement.  Continue daily Lasix for 7 days. ?  ?Sepsis/gram-negative UTI: Presented with tachycardia, tachypnea, elevated lactate level.  UA was suggestive of UTI.  Urine culture has not shown any growth.  Antibiotics discontinued  ?  ?Recent acetabular fracture: Recently admitted at [facility] hospital for the same.  Managed conservatively.  He needs to follow-up with orthopedics as an outpatient.  Patient was discharged to a SNF for rehabilitation.  Continue pain management, supportive care. ?PT/OT recommended home health on discharge. ?  ?COPD: Continue inhalers, bronchodilators.  On oxygen at home 2 L/min, currently at baseline  ? ?tobacco use: Counseled for cessation.  Continue nicotine patch ?  ?Seizure disorder: On Neurontin ?  ?Bipolar 1 disorder: On Seroquel, trazodone ?  ?Hypokalemia/hypomagnesemia: Supplemented with potassium/magnesium ?  ?  ? ? ? ? ? ?Discharge Diagnoses:  ?Principal Problem: ?  SBO (small bowel obstruction) ([place]) ?Active Problems: ?  Acetabular fracture ([place]) ?  Sepsis due to gram-negative UTI ([place]) ?  Severe sepsis ([place]) ?  COPD (  chronic obstructive pulmonary disease) ([place]) ?  Bipolar 1 disorder  ([place]) ?  Seizure disorder ([place]) ?  Macrocytic anemia ?  Upper GI bleed ?  Acute esophagitis ? ? ? ?Discharge Instructions ? ?Discharge Instructions   ? ? Ambulatory referral to Physical Therapy   Complete by: As directed ?  ? Iontophoresis - 4 mg/ml of dexamethasone: No  ? T.E.N.S. Unit Evaluation and Dispense as Indicated: No  ? Diet general   Complete by: As directed ?  ? Discharge instructions   Complete by: As directed ?  ? 1)Please take prescribed medications as instructed ?2)Follow up with your PCP in a week.  Do a CBC, BMP just during the follow-up ?3)Follow up with outpatient physical therapy, occupational therapy  ? Increase activity slowly   Complete by: As directed ?  ? No wound care   Complete by: As directed ?  ? ?  ? ?Allergies as of 10/11/2021   ? ?   Reactions  ? Ace Inhibitors Swelling  ? Angioedema 10/16 requiring intubation @ OSH 2/2 pt reportedly taking someone else's Lisinopril.  ?Angioedema 10/16 requiring intubation @ OSH 2/2 pt reportedly taking someone else's Lisinopril.   ? Lisinopril Other (See Comments)  ? unknown  ? ?  ? ?  ?Medication List  ?  ? ?STOP taking these medications   ? ?cephALEXin 500 MG capsule ?Commonly known as: KEFLEX ?  ? ?  ? ?TAKE these medications   ? ?Advair Diskus 500-50 MCG/ACT Aepb ?Generic drug: fluticasone-salmeterol ?Inhale 1 puff into the lungs in the morning and at bedtime. ?  ?albuterol 108 (90 Base) MCG/ACT inhaler ?Commonly known as: VENTOLIN HFA ?Inhale 2 puffs into the lungs every 6 (six) hours as needed for wheezing or shortness of breath. ?  ?bisacodyl 5 MG EC tablet ?Commonly known as: DULCOLAX ?Take 1 tablet (5 mg total) by mouth daily as needed for moderate constipation. ?What changed: when to take this ?  ?cetirizine 10 MG tablet ?Commonly known as: ZYRTEC ?Take 10 mg by mouth daily. ?  ?diclofenac Sodium 1 % Gel ?Commonly known as: VOLTAREN ?Apply 1 application. topically daily as needed (pain). ?  ?feeding supplement Liqd ?Take 237 mLs by mouth 2  (two) times daily between meals. ?  ?ferrous [id number] C-folic acid capsule ?Commonly known as: TRINSICON / FOLTRIN ?Take 1 capsule by mouth 2 (two) times daily after a meal. ?  ?ferrous sulfate 325 (65 FE) MG tablet ?Take 1 tablet (325 mg total) by mouth daily. ?  ?fluticasone 50 MCG/ACT nasal spray ?Commonly known as: FLONASE ?Place 2 sprays into both nostrils daily as needed for allergies or rhinitis. ?  ?furosemide 40 MG tablet ?Commonly known as: Lasix ?Take 1 tablet (40 mg total) by mouth daily. ?  ?gabapentin 300 MG capsule ?Commonly known as: NEURONTIN ?Take 300 mg by mouth 3 (three) times daily. ?  ?latanoprost 0.005 % ophthalmic solution ?Commonly known as: XALATAN ?Place 1 drop into both eyes at bedtime. ?  ?multivitamin with minerals Tabs tablet ?Take 1 tablet by mouth daily. ?  ?nicotine 21 mg/24hr patch ?Commonly known as: NICODERM CQ - dosed in mg/24 hours ?Place 1 patch (21 mg total) onto the skin daily. ?  ?oxyCODONE 5 MG immediate release tablet ?Commonly known as: Oxy IR/ROXICODONE ?Take 1 tablet (5 mg total) by mouth every 4 (four) hours as needed. ?  ?pantoprazole 40 MG tablet ?Commonly known as: PROTONIX ?Take 1 tablet (40 mg total) by mouth 2 (two) times daily. ?What changed:  when to take this ?  ?polyethylene glycol 17 g packet ?Commonly known as: MIRALAX / GLYCOLAX ?Take 17 g by mouth 2 (two) times daily for 7 days. ?  ?QUEtiapine 300 MG 24 hr tablet ?Commonly known as: SEROQUEL XR ?Take 150 mg by mouth 2 (two) times daily. ?  ?Spiriva Respimat 2.5 MCG/ACT Aers ?Generic drug: Tiotropium Bromide Monohydrate ?Inhale 2 puffs into the lungs daily. ?What changed: Another medication with the same name was removed. Continue taking this medication, and follow the directions you see here. ?  ?thiamine 100 MG tablet ?Take 1 tablet (100 mg total) by mouth daily. ?  ? ?  ? ? Follow-up Information   ? ? [facility] Follow up.   ?Contact information: ?[facility]  ? ?[address] ?[place], [place] [address] ? ?phone [phone] ? ?  ?  ? ? Center, [facility] Follow up.   ?Spe

## 2021-10-11 NOTE — Progress Notes (Signed)
Mobility Specialist Progress Note: ? ? 10/11/21 1213  ?Mobility  ?Activity Ambulated with assistance in hallway  ?Level of Assistance Standby assist, set-up cues, supervision of patient - no hands on  ?Assistive Device Front wheel walker  ?LLE Weight Bearing TWB  ?Distance Ambulated (ft) 200 ft  ?Activity Response Tolerated well  ?$Mobility charge 1 Mobility  ? ?Pt received in bed willing to participate in mobility. Complaints of L hip pain. Pt required multiple rest breaks. Left EOB with call bell in reach and all needs met.  ? ?During Mobility:140 HR; 100% SpO2 ?Post Mobility:    123 HR ? ?Perry Bullock ?Mobility Specialist ?Primary Phone [phone] ? ?

## 2021-10-11 NOTE — Progress Notes (Addendum)
? ? ? ?[facility] Progress Note ? ?CC:   Small bowel obstruction vs ileus, hematemesis ? ?Subjective: He is wanting to go home, waiting for his discharge papers. No N/V. Has mild central abdominal pain, no severe abdominal pain. He passed 2 small stools this morning.  ? ? ?Objective:  ?Vital signs in last 24 hours: ?Temp:  [98 ?F (36.7 ?C)-98.2 ?F (36.8 ?C)] 98.1 ?F (36.7 ?C) (04/14 [id number]) ?Pulse Rate:  [93-109] 93 (04/14 0854) ?Resp:  [18] 18 (04/14 0854) ?BP: (102-115)/(61-89) 104/77 (04/14 0854) ?SpO2:  [98 %-100 %] 100 % (04/14 0854) ?Last BM Date : 10/10/21 ?General:   61 year old male in NAD.  ?Heart: Regular rate and rhythm, no murmurs. ?Pulm: Breath sounds diminished throughout. ?Abdomen: Protuberant, soft, mild tenderness above and below the umbilicus without rebound or guarding.  Positive bowel sounds to all 4 quadrants. ?Extremities:  Without edema. ?Neurologic:  Alert and  oriented x 4. Grossly normal neurologically. ?Psych:  Alert and cooperative. Normal mood and affect. ? ?Intake/Output from previous day: ?04/13 0701 - 04/14 0700 ?In: -  ?Out: 5900 [Urine:5900] ?Intake/Output this shift: ?Total I/O ?In: -  ?Out: 860 [Urine:860] ? ?Lab Results: ?Recent Labs  ?  10/09/21 ?0225 10/10/21 ?0105  ?WBC 7.3 8.5  ?HGB 7.7* 9.0*  ?HCT 23.0* 27.7*  ?PLT 355 455*  ? ?BMET ?Recent Labs  ?  10/10/21 ?0105 10/10/21 ?1118 10/11/21 ?[id number]  ?NA 134* 138 133*  ?K 3.7 4.1 4.8  ?CL 105 106 99  ?CO2 22 19* 22  ?GLUCOSE 110* 107* 93  ?BUN 5* 6 9  ?CREATININE 1.03 1.05 0.96  ?CALCIUM 8.3* 8.6* 8.7*  ? ?LFT ?Recent Labs  ?  10/09/21 ?0225  ?PROT 4.9*  ?ALBUMIN 2.3*  ?AST 23  ?ALT 21  ?ALKPHOS 84  ?BILITOT 0.5  ? ?PT/INR ?No results for input(s): LABPROT, INR in the last 72 hours. ?Hepatitis Panel ?No results for input(s): HEPBSAG, HCVAB, HEPAIGM, HEPBIGM in the last 72 hours. ? ?DG Abd Portable 1V ? ?Result Date: 10/11/2021 ?CLINICAL DATA:  Small-bowel obstruction. EXAM: PORTABLE ABDOMEN - 1 VIEW COMPARISON:   10/09/2021. FINDINGS: Mildly dilated loops of small bowel are noted in the mid abdomen measuring up to 3.2 cm, improved from the prior exam. Retained contrast is present in the colon. No radio-opaque calculi. Fixation hardware is present in the proximal femurs bilaterally. IMPRESSION: Mildly distended small bowel, improved from the prior exam. Contrast is identified in the colon suggesting partial small bowel obstruction. Electronically Signed   By: Brett Fairy M.D.   On: 10/11/2021 04:45   ? ?Assessment / Plan: ? ?58) 61 year old male admitted to the hospital with hematemesis and abdominal distention.  CTAP showed a possible partial small obstruction.  Patient was evaluated by general surgery who thought he most likely had a reactive ileus secondary to his recent hospital admission for syncope, left hip fracture on pain meds.  He received Gastrografin per SBO protocol, follow-up x-ray showed contrast in the colon.  Abdominal xray today showed mildly dilated small bowel, improved from prior exam with retained contrast in the colon.  Has 2 small stools earlier this morning and continues to pass small amounts of gas per the rectum.  No nausea or vomiting. ?-Okay to discharge home from GI perspective ?-MiraLAX twice daily ?-CBC and BMP in 1 week ?-Our office will contact patient for follow-up in our GI clinic for ileus follow-up and to discuss scheduling a future EGD and colonoscopy ?  ?2) Hematemesis.  S/P EGD 10/09/2021 identified grade D reflux esophagitis, likely source of GI bleeding, a 6 cm hiatal hernia and enlarged gastric folds were and a nonbleeding duodenal diverticulum was noted. ?-Continue Protonix 40 mg p.o. twice daily for 8 weeks to heal esophagitis then once daily ?-Repeat EGD in 8 weeks to assess for esophagitis healing, our office will contact patient to facilitate follow up and repeat EGD ?-Await pathology results ?  ?3) Anemia, multifactorial: Anemia of chronic disease, hematemesis. Stable  hemoglobin 9.0.  No further hematemesis since admission. Received Feraheme IV x 1.  ?  ?4) COPD on 2 to 3 L  ?  ?5) Left acetabular wall fracture with disruption of the ilioischial line consistent with acute posterior column fracture.  Conservative management. On Oxycodone 5mg  po Q 4hrs PRN. ? ?Principal Problem: ?  SBO (small bowel obstruction) ([place]) ?Active Problems: ?  COPD (chronic obstructive pulmonary disease) ([place]) ?  Bipolar 1 disorder ([place]) ?  Acetabular fracture ([place]) ?  Sepsis due to gram-negative UTI ([place]) ?  Seizure disorder ([place]) ?  Severe sepsis ([place]) ?  Macrocytic anemia ?  Upper GI bleed ?  Acute esophagitis ? ? ? ? LOS: 5 days  ? ?Patrecia Pour Kennedy-Smith  10/11/2021, 1:48 PM ? ?I have taken a history, reviewed the chart and examined the patient. I performed a substantive portion of this encounter, including complete performance of at least one of the key components, in conjunction with the APP. I agree with the APP's note, impression and recommendations ? ?Patient doing well from GI perspective, no abdominal pain, nausea/vomiting.  Passing small stools.  Hgb stable.  ?X-ray showed persistent prominent small bowel loops, but he is not obstructed.   ?Will plan to reassess as outpatient and get enterography or SBFT if persistent. ?Patient indicated he would like to follow up with GI in [place] which is where he lives and this is reasonable.  We will make an appointment and he can cancel if he establishes GI care in [place] ? ? ?Viren Lebeau E. Candis Schatz, MD ?[facility] ? ? ?

## 2021-10-11 NOTE — Progress Notes (Signed)
Physical Therapy Treatment ?Patient Details ?Name: Perry Bullock ?MRN: [id number] ?DOB: 1961/03/13 ?Today's Date: 10/11/2021 ? ? ?History of Present Illness Pt is a 61 y.o. male admitted from SNF 10/[facility]/21 with generalized abdominal pain. Workup revealed partial SBO; T8, T10-11 compression fxs, likely chronic. Of note, recent admissions at [facility] 4/3-10/04/21 for syncope with fall, non-operative L acetabular fx with d/c to SNF. Other PMH includes blind R eye, bipolar 1 disorder, gluacoma, Hep C, COPD, DDD, PVD, chronic back pain, asthma, anxiety, arthritis. ? ?  ?PT Comments  ? ? Pt agreeable to session with continued focus on progression of functional mobility and increased endurance with good progress towards goals. Pt able to maintain TDWB throughout session with minimal cueing and min guard for safety. Pt continues to require cues for pacing and increasing activity tolerance slowly as pt requiring repeated standing recovery breaks during ambulation. Reviewed education on safe stair negotiation, pt able to recall sequencing with min prompting. Anticipate safe discharge with spouse assistance once medically cleared, will follow acutely. Pt continues to benefit from skilled PT services to progress toward functional mobility goals.   ?  ?Recommendations for follow up therapy are one component of a multi-disciplinary discharge planning process, led by the attending physician.  Recommendations may be updated based on patient status, additional functional criteria and insurance authorization. ? ?Follow Up Recommendations ? Home health PT ?  ?  ?Assistance Recommended at Discharge Intermittent Supervision/Assistance  ?Patient can return home with the following A little help with walking and/or transfers;A little help with bathing/dressing/bathroom;Assistance with cooking/housework;Assist for transportation;Help with stairs or ramp for entrance ?  ?Equipment Recommendations ? None recommended by PT  ?  ?Recommendations for Other  Services   ? ? ?  ?Precautions / Restrictions Precautions ?Precautions: Fall ?Restrictions ?Weight Bearing Restrictions: Yes ?LLE Weight Bearing: Touchdown weight bearing ?Other Position/Activity Restrictions: Recent L acetabular fx (non-operative management)  ?  ? ?Mobility ? Bed Mobility ?Overal bed mobility: Modified Independent ?Bed Mobility: Supine to Sit ?  ?  ?Supine to sit: Supervision ?  ?  ?  ?  ? ?Transfers ?Overall transfer level: Needs assistance ?Equipment used: Rolling walker (2 wheels) ?Transfers: Sit to/from Stand ?Sit to Stand: Supervision ?  ?  ?  ?  ?  ?General transfer comment: good hand placement and sequencing; supervision for safety ?  ? ?Ambulation/Gait ?Ambulation/Gait assistance: Min guard ?Gait Distance (Feet): 220 Feet ?Assistive device: Rolling walker (2 wheels) ?Gait Pattern/deviations: Step-to pattern ?Gait velocity: Decreased ?  ?  ?General Gait Details: antalgic gait with RW and intermittent min guard for balance; good compliacne with WB precautions, cues for pacing. repreated standing recovery breaks ? ? ?Stairs ?Stairs: Yes ?  ?  ?  ?General stair comments: verbally reviewed safe stair sequencing, pt able to recall for ascent and descent ? ? ?Wheelchair Mobility ?  ? ?Modified Rankin (Stroke Patients Only) ?  ? ? ?  ?Balance Overall balance assessment: Needs assistance ?Sitting-balance support: No upper extremity supported ?Sitting balance-Leahy Scale: Good ?Sitting balance - Comments: indep with posterior pericare sitting on toilet ?  ?Standing balance support: Single extremity supported, Bilateral upper extremity supported, During functional activity ?Standing balance-Leahy Scale: Poor ?Standing balance comment: reliant on RW ?  ?  ?  ?  ?  ?  ?  ?  ?  ?  ?  ?  ? ?  ?Cognition Arousal/Alertness: Awake/alert ?Behavior During Therapy: [facility] for tasks assessed/performed ?Overall Cognitive Status: Within Functional Limits for tasks assessed ?  ?  ?  ?  ?  ?  ?  ?  ?  ?  ?  ?  ?  ?  ?   ?  ?  General Comments: WFL for simple tasks; requires some redirection in conversation; good ability to problem solve through safe mobility at home ?  ?  ? ?  ?Exercises Total Joint Exercises ?Ankle Circles/Pumps: 20 reps ?Long Arc Quad: Right, Left, 10 reps ? ?  ?General Comments   ?  ?  ? ?Pertinent Vitals/Pain Pain Assessment ?Pain Assessment: Faces ?Faces Pain Scale: Hurts little more ?Pain Location: left hip ?Pain Descriptors / Indicators: Sore ?Pain Intervention(s): Limited activity within patient's tolerance, Monitored during session, Repositioned  ? ? ?Home Living   ?  ?  ?  ?  ?  ?  ?  ?  ?  ?   ?  ?Prior Function    ?  ?  ?   ? ?PT Goals (current goals can now be found in the care plan section) Acute Rehab PT Goals ?PT Goal Formulation: With patient ?Time For Goal Achievement: 10/14/21 ? ?  ?Frequency ? ? ? Min 5X/week ? ? ? ?  ?PT Plan Current plan remains appropriate  ? ? ?Co-evaluation   ?  ?  ?  ?  ? ?  ?AM-PAC PT "6 Clicks" Mobility   ?Outcome Measure ? Help needed turning from your back to your side while in a flat bed without using bedrails?: None ?Help needed moving from lying on your back to sitting on the side of a flat bed without using bedrails?: None ?Help needed moving to and from a bed to a chair (including a wheelchair)?: A Little ?Help needed standing up from a chair using your arms (e.g., wheelchair or bedside chair)?: A Little ?Help needed to walk in hospital room?: A Little ?Help needed climbing 3-5 steps with a railing? : A Lot ?6 Click Score: 19 ? ?  ?End of Session Equipment Utilized During Treatment: Gait belt ?Activity Tolerance: Patient tolerated treatment well ?Patient left: in bed;with call bell/phone within reach (seated EOB) ?Nurse Communication: Mobility status ?PT Visit Diagnosis: Other abnormalities of gait and mobility (R26.89);Muscle weakness (generalized) (M62.81);Pain ?Pain - Right/Left: Left ?Pain - part of body: Hip ?  ? ? ?Time: 9163-8466 ?PT Time Calculation (min)  (ACUTE ONLY): 26 min ? ?Charges:  $Gait Training: 23-37 mins          ?          ? ?Lenora Boys. PTA ?Acute Rehabilitation Services ?Office: [phone] ? ? ? ?Perry Bullock ?10/11/2021, 10:13 AM ? ?

## 2021-10-14 ENCOUNTER — Telehealth: Payer: Self-pay | Admitting: *Deleted

## 2021-10-14 NOTE — Telephone Encounter (Signed)
Left message for Perry Bullock [facility]) to call back to schedule f/u lung screening CT scan.  ?

## 2021-10-15 NOTE — Telephone Encounter (Signed)
Spoke with pts SO and she states that Dr. Tomasa Rand had told them he was going to try and get the follow-up EGD scheduled in [place]. She states they want to see someone closer to where they live. Please advise. ?

## 2021-10-16 NOTE — Telephone Encounter (Signed)
Spoke with pts wife and she states they will try to get in with [facility]. If they have issues they will call our office back to schedule an appt. ?

## 2021-10-17 ENCOUNTER — Encounter: Payer: Self-pay | Admitting: Gastroenterology

## 2021-10-23 ENCOUNTER — Encounter: Payer: Self-pay | Admitting: Nurse Practitioner

## 2021-10-23 ENCOUNTER — Ambulatory Visit: Payer: Medicaid Other | Admitting: Nurse Practitioner

## 2021-10-23 ENCOUNTER — Ambulatory Visit
Admission: RE | Admit: 2021-10-23 | Discharge: 2021-10-23 | Disposition: A | Discharge status: Home / Self Care | Payer: Medicaid Other | Source: Ambulatory Visit | Attending: Acute Care | Admitting: Acute Care

## 2021-10-23 VITALS — BP 106/72 | HR 100 | Temp 98.8°F | Resp 16 | Ht 71.0 in | Wt 155.0 lb

## 2021-10-23 DIAGNOSIS — Z9981 Dependence on supplemental oxygen: Secondary | ICD-10-CM

## 2021-10-23 DIAGNOSIS — F17219 Nicotine dependence, cigarettes, with unspecified nicotine-induced disorders: Secondary | ICD-10-CM | POA: Diagnosis not present

## 2021-10-23 DIAGNOSIS — Z87891 Personal history of nicotine dependence: Secondary | ICD-10-CM | POA: Insufficient documentation

## 2021-10-23 DIAGNOSIS — R0602 Shortness of breath: Secondary | ICD-10-CM

## 2021-10-23 DIAGNOSIS — J441 Chronic obstructive pulmonary disease with (acute) exacerbation: Secondary | ICD-10-CM

## 2021-10-23 DIAGNOSIS — F17210 Nicotine dependence, cigarettes, uncomplicated: Secondary | ICD-10-CM | POA: Diagnosis not present

## 2021-10-23 MED ORDER — ALBUTEROL SULFATE HFA 108 (90 BASE) MCG/ACT IN AERS: 2.0000 | INHALATION_SPRAY | Freq: Four times a day (QID) | RESPIRATORY_TRACT | 5 refills | Status: DC | PRN

## 2021-10-23 MED ORDER — NICOTINE 21 MG/24HR TD PT24: 21.0000 mg | MEDICATED_PATCH | Freq: Every day | TRANSDERMAL | 5 refills | Status: DC

## 2021-10-23 MED ORDER — SPIRIVA RESPIMAT 2.5 MCG/ACT IN AERS: 2.0000 | INHALATION_SPRAY | Freq: Every day | RESPIRATORY_TRACT | 11 refills | Status: DC

## 2021-10-23 MED ORDER — MOMETASONE FURO-FORMOTEROL FUM 200-5 MCG/ACT IN AERO: 2.0000 | INHALATION_SPRAY | Freq: Two times a day (BID) | RESPIRATORY_TRACT | 5 refills | Status: DC

## 2021-10-23 MED ORDER — ALBUTEROL SULFATE (2.5 MG/3ML) 0.083% IN NEBU: 2.5000 mg | INHALATION_SOLUTION | Freq: Four times a day (QID) | RESPIRATORY_TRACT | 5 refills | Status: DC | PRN

## 2021-10-23 NOTE — Progress Notes (Signed)
[facility] [address] [place], [place] [address]  Internal MEDICINE  Office Visit Note  Patient Name: Perry Bullock  [id number]  [id number]  Date of Service: 10/23/2021  Chief Complaint  Patient presents with   Follow-up   COPD   Medication Refill    HPI Perry Bullock presents for follow-up visit for COPD and medication refills.  He has had no significant changes in his current respiratory status.  He reports that he has a fractured hip and is just in a hurry to leave because his hip hurts but otherwise his breathing is the same and he is currently using his medications as prescribed he does have nebulizer treatments and a rescue inhaler that he uses as needed and the frequency tends to vary depending on the day. Spirometry performed in office today.  According to the spirometry he has a 3% improvement in his FVC and a 16% improvement in his FEV1 with an overall improvement of 19% in his ratio when compared to his previous spirometry in July of last year.  Patient is continuing to use Dulera and Spiriva as maintenance therapy.  He has albuterol nebulizer treatments as needed and an albuterol inhaler for shortness of breath as needed.  He is also trying to quit smoking still and would like to have nicotine patches or nicotine replacement therapy.  CT chest from 10/23/21: COMPARISON:  Low-dose lung cancer screening CT chest dated 05/15/2021 FINDINGS: --Cardiovascular: Heart is normal in size.  No pericardial effusion. --No evidence of thoracic aortic aneurysm. Ectasia of the ascending thoracic aorta, measuring 3.8 cm. Atherosclerotic calcifications of the arch. --Mediastinum/Nodes: No suspicious mediastinal lymphadenopathy. --Visualized thyroid is unremarkable. --Lungs/Pleura: Biapical pleural-parenchymal scarring with cavitary fibrosis and bronchiectasis at the right lung apex. -14.4 mm subpleural nodular opacity in the lateral left upper lobe, unchanged. -12.3 mm irregular  nodular opacity in the posterior right lower lobe, unchanged. -9.2 mm well-circumscribed nodule in the medial left lung apex, with central benign calcification, unchanged. -Additional scattered small bilateral pulmonary nodules measuring up to 5.6 mm, unchanged. -No focal consolidation. -Mild centrilobular emphysematous changes, upper lung predominant. -No pleural effusion or pneumothorax. ---Upper Abdomen: Visualized upper abdomen is notable for moderate hepatic steatosis with focal fatty sparing along gallbladder fossa. -Mild vascular calcifications. ---Musculoskeletal: Stable mild-to-moderate wedging at T10 and T12.   IMPRESSION: Lung-RADS 2, benign appearance or behavior. Continue annual screening with low-dose chest CT without contrast in 12 months. Aortic Atherosclerosis (ICD10-I70.0) and Emphysema (ICD10-J43.9).  Current Medication: Outpatient Encounter Medications as of 10/23/2021  Medication Sig   ADVAIR DISKUS 500-50 MCG/ACT AEPB Inhale 1 puff into the lungs in the morning and at bedtime.   albuterol (PROVENTIL) (2.5 MG/3ML) 0.083% nebulizer solution Take 3 mLs (2.5 mg total) by nebulization every 6 (six) hours as needed for wheezing or shortness of breath.   bisacodyl (DULCOLAX) 5 MG EC tablet Take 1 tablet (5 mg total) by mouth daily as needed for moderate constipation. (Patient taking differently: Take 5 mg by mouth daily.)   cetirizine (ZYRTEC) 10 MG tablet Take 10 mg by mouth daily.   diclofenac Sodium (VOLTAREN) 1 % GEL Apply 1 application. topically daily as needed (pain).   feeding supplement (ENSURE ENLIVE / ENSURE PLUS) LIQD Take 237 mLs by mouth 2 (two) times daily between meals.   ferrous fumarate-b12-vitamic C-folic acid (TRINSICON / FOLTRIN) capsule Take 1 capsule by mouth 2 (two) times daily after a meal.   ferrous sulfate 325 (65 FE) MG tablet Take 1 tablet (325 mg  total) by mouth daily.   fluticasone (FLONASE) 50 MCG/ACT nasal spray Place 2 sprays into both  nostrils daily as needed for allergies or rhinitis.   furosemide (LASIX) 40 MG tablet Take 1 tablet (40 mg total) by mouth daily.   gabapentin (NEURONTIN) 300 MG capsule Take 300 mg by mouth 3 (three) times daily.   latanoprost (XALATAN) 0.005 % ophthalmic solution Place 1 drop into both eyes at bedtime.   mometasone-formoterol (DULERA) 200-5 MCG/ACT AERO Inhale 2 puffs into the lungs 2 (two) times daily.   Multiple Vitamin (MULTIVITAMIN WITH MINERALS) TABS tablet Take 1 tablet by mouth daily.   oxyCODONE (OXY IR/ROXICODONE) 5 MG immediate release tablet Take 1 tablet (5 mg total) by mouth every 4 (four) hours as needed.   pantoprazole (PROTONIX) 40 MG tablet Take 1 tablet (40 mg total) by mouth 2 (two) times daily.   QUEtiapine (SEROQUEL XR) 300 MG 24 hr tablet Take 150 mg by mouth 2 (two) times daily.   thiamine 100 MG tablet Take 1 tablet (100 mg total) by mouth daily.   [DISCONTINUED] albuterol (VENTOLIN HFA) 108 (90 Base) MCG/ACT inhaler Inhale 2 puffs into the lungs every 6 (six) hours as needed for wheezing or shortness of breath.   [DISCONTINUED] nicotine (NICODERM CQ - DOSED IN MG/24 HOURS) 21 mg/24hr patch Place 1 patch (21 mg total) onto the skin daily.   [DISCONTINUED] Tiotropium Bromide Monohydrate (SPIRIVA RESPIMAT) 2.5 MCG/ACT AERS Inhale 2 puffs into the lungs daily.   albuterol (VENTOLIN HFA) 108 (90 Base) MCG/ACT inhaler Inhale 2 puffs into the lungs every 6 (six) hours as needed for wheezing or shortness of breath.   nicotine (NICODERM CQ - DOSED IN MG/24 HOURS) 21 mg/24hr patch Place 1 patch (21 mg total) onto the skin daily.   Tiotropium Bromide Monohydrate (SPIRIVA RESPIMAT) 2.5 MCG/ACT AERS Inhale 2 puffs into the lungs daily.   No facility-administered encounter medications on file as of 10/23/2021.    Surgical History: Past Surgical History:  Procedure Laterality Date   BIOPSY  10/09/2021   Procedure: BIOPSY;  Surgeon: Jenel Lucks, MD;  Location: [facility] ENDOSCOPY;   Service: Gastroenterology;;   ESOPHAGOGASTRODUODENOSCOPY (EGD) WITH PROPOFOL N/A 10/09/2021   Procedure: ESOPHAGOGASTRODUODENOSCOPY (EGD) WITH PROPOFOL;  Surgeon: Jenel Lucks, MD;  Location: [facility] ENDOSCOPY;  Service: Gastroenterology;  Laterality: N/A;   EYE SURGERY Right    HIP SURGERY Left    INTRAMEDULLARY (IM) NAIL INTERTROCHANTERIC Right 01/16/2017   Procedure: INTRAMEDULLARY (IM) NAIL INTERTROCHANTRIC;  Surgeon: Christena Flake, MD;  Location: [facility] ORS;  Service: Orthopedics;  Laterality: Right;   INTRAMEDULLARY (IM) NAIL INTERTROCHANTERIC Left 03/09/2020   Procedure: INTRAMEDULLARY (IM) NAIL INTERTROCHANTRIC;  Surgeon: Juanell Fairly, MD;  Location: [facility] ORS;  Service: Orthopedics;  Laterality: Left;   INTUBATION-ENDOTRACHEAL WITH TRACHEOSTOMY STANDBY N/A 04/22/2015   Procedure: INTUBATION-ENDOTRACHEAL WITH TRACHEOSTOMY STANDBY;  Surgeon: Linus Salmons, MD;  Location: [facility] ORS;  Service: ENT;  Laterality: N/A;   JOINT REPLACEMENT Left    Lt shoulder   MANDIBLE SURGERY      Medical History: Past Medical History:  Diagnosis Date   Anxiety    Arthritis    Asthma    Bipolar 1 disorder (HCC)    Blind right eye    Broken hip (HCC)    Chronic back pain    Diverticulosis   Chronic headaches    Convulsion (HCC)    once a month when stands up too quickly   COPD (chronic obstructive pulmonary disease) (HCC)  DDD (degenerative disc disease), lumbar    Diverticulitis    GERD (gastroesophageal reflux disease)    Glaucoma    Hepatitis    hepatitis C   Pneumonia    PVD (peripheral vascular disease) (HCC)    lower extremities reddened and feet swollen   Seasonal allergies    Shortness of breath dyspnea     Family History: Family History  Problem Relation Age of Onset   Breast cancer Mother    Hypertension Father    Diabetes Father     Social History   Socioeconomic History   Marital status: Single    Spouse name: Not on file   Number of children: Not on file    Years of education: Not on file   Highest education level: Not on file  Occupational History   Not on file  Tobacco Use   Smoking status: Every Day    Packs/day: 0.00    Years: 35.00    Pack years: 0.00    Types: Cigarettes   Smokeless tobacco: Never   Tobacco comments:    4 cigs daily--02/27/16, pt has patch  Substance and Sexual Activity   Alcohol use: Yes    Alcohol/week: 3.0 standard drinks    Types: 3 Cans of beer per week    Comment: pt is cutting back to 3-4 daily   Drug use: No   Sexual activity: Yes    Birth control/protection: None  Other Topics Concern   Not on file  Social History Narrative   Not on file   Social Determinants of Health   Financial Resource Strain: Not on file  Food Insecurity: Not on file  Transportation Needs: Not on file  Physical Activity: Not on file  Stress: Not on file  Social Connections: Not on file  Intimate Partner Violence: Not on file      Review of Systems  Constitutional:  Positive for fatigue. Negative for chills and unexpected weight change.  HENT:  Negative for congestion, rhinorrhea, sneezing and sore throat.   Respiratory:  Negative for cough, chest tightness and shortness of breath.   Cardiovascular: Negative.  Negative for chest pain and palpitations.  Musculoskeletal:  Positive for arthralgias. Negative for back pain, joint swelling and neck pain.  Neurological: Negative.   Psychiatric/Behavioral:  Behavioral problem: Depression.    Vital Signs: BP 106/72   Pulse 100   Temp 98.8 F (37.1 C)   Resp 16   Ht  (1.803 m)   Wt 155 lb (70.3 kg)   SpO2 97%   BMI 21.62 kg/m    Physical Exam Vitals reviewed.  Constitutional:      General: He is not in acute distress.    Appearance: Normal appearance. He is normal weight. He is not ill-appearing.  HENT:     Head: Normocephalic and atraumatic.  Eyes:     Pupils: Pupils are equal, round, and reactive to light.  Cardiovascular:     Rate and Rhythm: Normal  rate and regular rhythm.     Heart sounds: Normal heart sounds. No murmur heard. Pulmonary:     Effort: Pulmonary effort is normal. No accessory muscle usage or respiratory distress.     Breath sounds: Normal air entry. Examination of the right-upper field reveals wheezing. Examination of the left-upper field reveals wheezing. Examination of the right-lower field reveals decreased breath sounds. Examination of the left-lower field reveals decreased breath sounds. Decreased breath sounds and wheezing present.  Neurological:     Mental Status:  He is alert and oriented to person, place, and time.  Psychiatric:        Mood and Affect: Mood normal.        Behavior: Behavior normal.       Assessment/Plan: 1. Chronic obstructive pulmonary disease with acute exacerbation (HCC) Overall improvement in respiratory status. Using medications as prescribed. Continue, refills ordered. Recent CT chest done, no significant changes in pulmonary nodules as detailed in HPI. - Spirometry with Graph - albuterol (VENTOLIN HFA) 108 (90 Base) MCG/ACT inhaler; Inhale 2 puffs into the lungs every 6 (six) hours as needed for wheezing or shortness of breath.  Dispense: 8 g; Refill: 5 - Tiotropium Bromide Monohydrate (SPIRIVA RESPIMAT) 2.5 MCG/ACT AERS; Inhale 2 puffs into the lungs daily.  Dispense: 4 g; Refill: 11 - mometasone-formoterol (DULERA) 200-5 MCG/ACT AERO; Inhale 2 puffs into the lungs 2 (two) times daily.  Dispense: 13 g; Refill: 5 - albuterol (PROVENTIL) (2.5 MG/3ML) 0.083% nebulizer solution; Take 3 mLs (2.5 mg total) by nebulization every 6 (six) hours as needed for wheezing or shortness of breath.  Dispense: 150 mL; Refill: 5  2. Shortness of breath Overall spiro improved since last spiro in July 2022.  - Spirometry with Graph - albuterol (PROVENTIL) (2.5 MG/3ML) 0.083% nebulizer solution; Take 3 mLs (2.5 mg total) by nebulization every 6 (six) hours as needed for wheezing or shortness of breath.   Dispense: 150 mL; Refill: 5  3. Cigarette nicotine dependence with nicotine-induced disorder Smoking cessation counseling provided discussed the importance of quitting smoking and how this may actually help to limit the worsening of his COPD states that he is going to give it ago. Patient is requesting nicotine patches. - nicotine (NICODERM CQ - DOSED IN MG/24 HOURS) 21 mg/24hr patch; Place 1 patch (21 mg total) onto the skin daily.  Dispense: 28 patch; Refill: 5  4. Supplemental oxygen dependent Continue with supplemental oxygen    General Counseling: Perry Bullock verbalizes understanding of the findings of todays visit and agrees with plan of treatment. I have discussed any further diagnostic evaluation that may be needed or ordered today. We also reviewed his medications today. he has been encouraged to call the office with any questions or concerns that should arise related to todays visit.    Orders Placed This Encounter  Procedures   Spirometry with Graph    Meds ordered this encounter  Medications   albuterol (VENTOLIN HFA) 108 (90 Base) MCG/ACT inhaler    Sig: Inhale 2 puffs into the lungs every 6 (six) hours as needed for wheezing or shortness of breath.    Dispense:  8 g    Refill:  5   Tiotropium Bromide Monohydrate (SPIRIVA RESPIMAT) 2.5 MCG/ACT AERS    Sig: Inhale 2 puffs into the lungs daily.    Dispense:  4 g    Refill:  11   nicotine (NICODERM CQ - DOSED IN MG/24 HOURS) 21 mg/24hr patch    Sig: Place 1 patch (21 mg total) onto the skin daily.    Dispense:  28 patch    Refill:  5   mometasone-formoterol (DULERA) 200-5 MCG/ACT AERO    Sig: Inhale 2 puffs into the lungs 2 (two) times daily.    Dispense:  13 g    Refill:  5   albuterol (PROVENTIL) (2.5 MG/3ML) 0.083% nebulizer solution    Sig: Take 3 mLs (2.5 mg total) by nebulization every 6 (six) hours as needed for wheezing or shortness of breath.    Dispense:  150 mL    Refill:  5    No follow-ups on  file.   Total time spent:30 Minutes Time spent includes review of chart, medications, test results, and follow up plan with the patient.   [place] Controlled Substance Database was reviewed by me.  This patient was seen by Sallyanne Kuster, FNP-C in collaboration with Dr. Beverely Risen as a part of collaborative care agreement.   Daily Doe R. Tedd Sias, MSN, FNP-C Internal medicine

## 2021-10-31 ENCOUNTER — Other Ambulatory Visit: Payer: Self-pay

## 2021-10-31 ENCOUNTER — Telehealth: Payer: Self-pay | Admitting: Acute Care

## 2021-10-31 DIAGNOSIS — Z122 Encounter for screening for malignant neoplasm of respiratory organs: Secondary | ICD-10-CM

## 2021-10-31 DIAGNOSIS — F17210 Nicotine dependence, cigarettes, uncomplicated: Secondary | ICD-10-CM

## 2021-10-31 DIAGNOSIS — Z87891 Personal history of nicotine dependence: Secondary | ICD-10-CM

## 2021-10-31 NOTE — Telephone Encounter (Signed)
Ok to call as a LR 2, many stable nodules and afew that are slightly larger but too small to do anything with at present. Place order for 12 month follow up and fax results to PCP. I am asking Dr. Reece Agar to review, but the nodules that grew are tiny. Thanks so much Lamar.  ?

## 2021-10-31 NOTE — Telephone Encounter (Signed)
Contacted patient to review results of recent CT Chest.  Patient also requested his friend, Jonnie Finner listen on call.  Results showed stable lung nodules with no suspicious findings for lung cancer. Will plan for next LDCT in 12 months.  Acknowledged understanding with no questions.  Order placed for annual LDCT and results faxed to PCP.  ?

## 2021-12-01 ENCOUNTER — Encounter: Payer: Self-pay | Admitting: Nurse Practitioner

## 2021-12-03 ENCOUNTER — Encounter: Payer: Medicaid Other | Admitting: Gastroenterology

## 2022-02-13 ENCOUNTER — Other Ambulatory Visit: Payer: Self-pay | Admitting: Internal Medicine

## 2022-02-13 DIAGNOSIS — J441 Chronic obstructive pulmonary disease with (acute) exacerbation: Secondary | ICD-10-CM

## 2022-02-17 ENCOUNTER — Other Ambulatory Visit: Payer: Self-pay | Admitting: Internal Medicine

## 2022-02-17 DIAGNOSIS — J441 Chronic obstructive pulmonary disease with (acute) exacerbation: Secondary | ICD-10-CM

## 2022-02-18 ENCOUNTER — Other Ambulatory Visit: Payer: Self-pay | Admitting: Internal Medicine

## 2022-02-18 DIAGNOSIS — J441 Chronic obstructive pulmonary disease with (acute) exacerbation: Secondary | ICD-10-CM

## 2022-02-19 ENCOUNTER — Observation Stay
Admission: EM | Admit: 2022-02-19 | Discharge: 2022-02-21 | Disposition: A | Discharge status: Other Acute Inpatient Hospital | Payer: Medicaid Other | Attending: Internal Medicine | Admitting: Internal Medicine

## 2022-02-19 ENCOUNTER — Other Ambulatory Visit: Payer: Self-pay

## 2022-02-19 ENCOUNTER — Observation Stay: Payer: Medicaid Other

## 2022-02-19 ENCOUNTER — Encounter: Payer: Self-pay | Admitting: Emergency Medicine

## 2022-02-19 DIAGNOSIS — E871 Hypo-osmolality and hyponatremia: Secondary | ICD-10-CM | POA: Insufficient documentation

## 2022-02-19 DIAGNOSIS — W19XXXA Unspecified fall, initial encounter: Secondary | ICD-10-CM | POA: Diagnosis not present

## 2022-02-19 DIAGNOSIS — M79604 Pain in right leg: Principal | ICD-10-CM | POA: Diagnosis present

## 2022-02-19 DIAGNOSIS — F1010 Alcohol abuse, uncomplicated: Secondary | ICD-10-CM | POA: Diagnosis present

## 2022-02-19 DIAGNOSIS — J449 Chronic obstructive pulmonary disease, unspecified: Secondary | ICD-10-CM | POA: Insufficient documentation

## 2022-02-19 DIAGNOSIS — J45909 Unspecified asthma, uncomplicated: Secondary | ICD-10-CM | POA: Diagnosis not present

## 2022-02-19 DIAGNOSIS — S72402A Unspecified fracture of lower end of left femur, initial encounter for closed fracture: Secondary | ICD-10-CM | POA: Insufficient documentation

## 2022-02-19 DIAGNOSIS — Z96612 Presence of left artificial shoulder joint: Secondary | ICD-10-CM | POA: Diagnosis not present

## 2022-02-19 DIAGNOSIS — I509 Heart failure, unspecified: Secondary | ICD-10-CM | POA: Diagnosis not present

## 2022-02-19 DIAGNOSIS — Z720 Tobacco use: Secondary | ICD-10-CM | POA: Diagnosis present

## 2022-02-19 DIAGNOSIS — S82101A Unspecified fracture of upper end of right tibia, initial encounter for closed fracture: Secondary | ICD-10-CM | POA: Diagnosis not present

## 2022-02-19 DIAGNOSIS — F17210 Nicotine dependence, cigarettes, uncomplicated: Secondary | ICD-10-CM | POA: Insufficient documentation

## 2022-02-19 DIAGNOSIS — M79605 Pain in left leg: Secondary | ICD-10-CM | POA: Insufficient documentation

## 2022-02-19 DIAGNOSIS — D649 Anemia, unspecified: Secondary | ICD-10-CM | POA: Insufficient documentation

## 2022-02-19 DIAGNOSIS — D539 Nutritional anemia, unspecified: Secondary | ICD-10-CM | POA: Diagnosis present

## 2022-02-19 DIAGNOSIS — F319 Bipolar disorder, unspecified: Secondary | ICD-10-CM | POA: Diagnosis present

## 2022-02-19 DIAGNOSIS — K219 Gastro-esophageal reflux disease without esophagitis: Secondary | ICD-10-CM | POA: Diagnosis present

## 2022-02-19 DIAGNOSIS — Z79899 Other long term (current) drug therapy: Secondary | ICD-10-CM | POA: Diagnosis not present

## 2022-02-19 LAB — URIC ACID: Uric Acid, Serum: 5.9 mg/dL (ref 3.7–8.6)

## 2022-02-19 LAB — COMPREHENSIVE METABOLIC PANEL
ALT: 38 U/L (ref 0–44)
AST: 54 U/L — ABNORMAL HIGH (ref 15–41)
Albumin: 2.8 g/dL — ABNORMAL LOW (ref 3.5–5.0)
Alkaline Phosphatase: 103 U/L (ref 38–126)
Anion gap: 15 (ref 5–15)
BUN: <5 mg/dL — ABNORMAL LOW (ref 8–23)
CO2: 17 mmol/L — ABNORMAL LOW (ref 22–32)
Calcium: 7.7 mg/dL — ABNORMAL LOW (ref 8.9–10.3)
Chloride: 95 mmol/L — ABNORMAL LOW (ref 98–111)
Creatinine, Ser: 0.38 mg/dL — ABNORMAL LOW (ref 0.61–1.24)
GFR, Estimated: >60 mL/min (ref >60)
Glucose, Bld: 76 mg/dL (ref 70–99)
Potassium: 4.6 mmol/L (ref 3.5–5.1)
Sodium: 127 mmol/L — ABNORMAL LOW (ref 135–145)
Total Bilirubin: 0.9 mg/dL (ref 0.3–1.2)
Total Protein: 5.6 g/dL — ABNORMAL LOW (ref 6.5–8.1)

## 2022-02-19 LAB — CBC WITH DIFFERENTIAL/PLATELET
Abs Immature Granulocytes: 0.04 10*3/uL (ref 0.00–0.07)
Basophils Absolute: 0 10*3/uL (ref 0.0–0.1)
Basophils Relative: 0 %
Eosinophils Absolute: 0.1 10*3/uL (ref 0.0–0.5)
Eosinophils Relative: 1 %
HCT: 33.8 % — ABNORMAL LOW (ref 39.0–52.0)
Hemoglobin: 11.7 g/dL — ABNORMAL LOW (ref 13.0–17.0)
Immature Granulocytes: 0 %
Lymphocytes Relative: 10 %
Lymphs Abs: 1.1 10*3/uL (ref 0.7–4.0)
MCH: 35.3 pg — ABNORMAL HIGH (ref 26.0–34.0)
MCHC: 34.6 g/dL (ref 30.0–36.0)
MCV: 102.1 fL — ABNORMAL HIGH (ref 80.0–100.0)
Monocytes Absolute: 1.1 10*3/uL — ABNORMAL HIGH (ref 0.1–1.0)
Monocytes Relative: 10 %
Neutro Abs: 8.5 10*3/uL — ABNORMAL HIGH (ref 1.7–7.7)
Neutrophils Relative %: 79 %
Platelets: 169 10*3/uL (ref 150–400)
RBC: 3.31 MIL/uL — ABNORMAL LOW (ref 4.22–5.81)
RDW: 12.2 % (ref 11.5–15.5)
WBC: 10.8 10*3/uL — ABNORMAL HIGH (ref 4.0–10.5)
nRBC: 0 % (ref 0.0–0.2)

## 2022-02-19 LAB — BRAIN NATRIURETIC PEPTIDE: B Natriuretic Peptide: 57.2 pg/mL (ref 0.0–100.0)

## 2022-02-19 MED ORDER — SODIUM CHLORIDE 0.9% FLUSH
3.0000 mL | Freq: Two times a day (BID) | INTRAVENOUS | Status: DC
  Administered 2022-02-19 – 2022-02-20 (×3): 3 mL via INTRAVENOUS

## 2022-02-19 MED ORDER — HYDROCODONE-ACETAMINOPHEN 5-325 MG PO TABS
1.0000 | ORAL_TABLET | Freq: Four times a day (QID) | ORAL | Status: DC | PRN
  Administered 2022-02-19 – 2022-02-20 (×2): 1 via ORAL
  Filled 2022-02-19 (×2): qty 1

## 2022-02-19 MED ORDER — LORAZEPAM 1 MG PO TABS: 1.0000 mg | ORAL_TABLET | ORAL | Status: DC | PRN

## 2022-02-19 MED ORDER — MOMETASONE FURO-FORMOTEROL FUM 200-5 MCG/ACT IN AERO: 2.0000 | INHALATION_SPRAY | Freq: Two times a day (BID) | RESPIRATORY_TRACT | Status: DC

## 2022-02-19 MED ORDER — PREDNISONE 10 MG PO TABS: 30.0000 mg | ORAL_TABLET | Freq: Every day | ORAL | Status: DC

## 2022-02-19 MED ORDER — QUETIAPINE FUMARATE ER 50 MG PO TB24
150.0000 mg | ORAL_TABLET | Freq: Two times a day (BID) | ORAL | Status: DC
  Administered 2022-02-19 – 2022-02-20 (×3): 150 mg via ORAL
  Filled 2022-02-19 (×3): qty 3

## 2022-02-19 MED ORDER — ENSURE ENLIVE PO LIQD
237.0000 mL | Freq: Two times a day (BID) | ORAL | Status: DC
  Administered 2022-02-19 – 2022-02-20 (×2): 237 mL via ORAL

## 2022-02-19 MED ORDER — LORAZEPAM 1 MG PO TABS: 0.0000 mg | ORAL_TABLET | Freq: Two times a day (BID) | ORAL | Status: DC

## 2022-02-19 MED ORDER — PREDNISONE 20 MG PO TABS
60.0000 mg | ORAL_TABLET | Freq: Once | ORAL | Status: AC
  Administered 2022-02-19: 60 mg via ORAL
  Filled 2022-02-19: qty 3

## 2022-02-19 MED ORDER — FERROUS SULFATE 325 (65 FE) MG PO TABS
325.0000 mg | ORAL_TABLET | Freq: Every day | ORAL | Status: DC
  Administered 2022-02-19 – 2022-02-20 (×2): 325 mg via ORAL
  Filled 2022-02-19 (×2): qty 1

## 2022-02-19 MED ORDER — SODIUM CHLORIDE 0.9 % IV BOLUS
1000.0000 mL | Freq: Once | INTRAVENOUS | Status: AC
  Administered 2022-02-19: 1000 mL via INTRAVENOUS

## 2022-02-19 MED ORDER — PANTOPRAZOLE SODIUM 40 MG PO TBEC
40.0000 mg | DELAYED_RELEASE_TABLET | Freq: Two times a day (BID) | ORAL | Status: DC
  Administered 2022-02-19 – 2022-02-20 (×3): 40 mg via ORAL
  Filled 2022-02-19 (×3): qty 1

## 2022-02-19 MED ORDER — ALLOPURINOL 100 MG PO TABS
100.0000 mg | ORAL_TABLET | Freq: Every day | ORAL | Status: DC
  Administered 2022-02-20: 100 mg via ORAL
  Filled 2022-02-19: qty 1

## 2022-02-19 MED ORDER — COLCHICINE 0.6 MG PO TABS: 0.6000 mg | ORAL_TABLET | Freq: Once | ORAL | Status: DC

## 2022-02-19 MED ORDER — THIAMINE HCL 100 MG PO TABS: 100.0000 mg | ORAL_TABLET | Freq: Every day | ORAL | Status: DC

## 2022-02-19 MED ORDER — ZOLPIDEM TARTRATE 5 MG PO TABS
10.0000 mg | ORAL_TABLET | Freq: Every day | ORAL | Status: DC
  Administered 2022-02-19 – 2022-02-20 (×2): 10 mg via ORAL
  Filled 2022-02-19 (×2): qty 2

## 2022-02-19 MED ORDER — MOMETASONE FURO-FORMOTEROL FUM 200-5 MCG/ACT IN AERO
2.0000 | INHALATION_SPRAY | Freq: Two times a day (BID) | RESPIRATORY_TRACT | Status: DC
  Administered 2022-02-19 – 2022-02-20 (×3): 2 via RESPIRATORY_TRACT
  Filled 2022-02-19 (×2): qty 8.8

## 2022-02-19 MED ORDER — MORPHINE SULFATE (PF) 2 MG/ML IV SOLN
2.0000 mg | Freq: Once | INTRAVENOUS | Status: AC
  Administered 2022-02-19: 2 mg via INTRAVENOUS
  Filled 2022-02-19: qty 1

## 2022-02-19 MED ORDER — THIAMINE HCL 100 MG/ML IJ SOLN
100.0000 mg | Freq: Every day | INTRAMUSCULAR | Status: DC
  Filled 2022-02-19: qty 2

## 2022-02-19 MED ORDER — SODIUM CHLORIDE 0.9% FLUSH: 3.0000 mL | INTRAVENOUS | Status: DC | PRN

## 2022-02-19 MED ORDER — NICOTINE 21 MG/24HR TD PT24
21.0000 mg | MEDICATED_PATCH | Freq: Every day | TRANSDERMAL | Status: DC
Start: 2022-02-19 — End: 2022-02-21
  Administered 2022-02-20: 21 mg via TRANSDERMAL
  Filled 2022-02-19 (×2): qty 1

## 2022-02-19 MED ORDER — ENOXAPARIN SODIUM 40 MG/0.4ML IJ SOSY
40.0000 mg | PREFILLED_SYRINGE | INTRAMUSCULAR | Status: DC
  Administered 2022-02-19 – 2022-02-20 (×2): 40 mg via SUBCUTANEOUS
  Filled 2022-02-19 (×2): qty 0.4

## 2022-02-19 MED ORDER — TIOTROPIUM BROMIDE MONOHYDRATE 18 MCG IN CAPS
18.0000 ug | ORAL_CAPSULE | Freq: Every day | RESPIRATORY_TRACT | Status: DC
  Administered 2022-02-20: 18 ug via RESPIRATORY_TRACT
  Filled 2022-02-19: qty 5

## 2022-02-19 MED ORDER — SUCRALFATE 1 G PO TABS
1.0000 g | ORAL_TABLET | Freq: Three times a day (TID) | ORAL | Status: DC
  Administered 2022-02-19 – 2022-02-20 (×4): 1 g via ORAL
  Filled 2022-02-19 (×4): qty 1

## 2022-02-19 MED ORDER — ACETAMINOPHEN 325 MG PO TABS: 650.0000 mg | ORAL_TABLET | Freq: Four times a day (QID) | ORAL | Status: DC | PRN

## 2022-02-19 MED ORDER — THIAMINE HCL 100 MG PO TABS
100.0000 mg | ORAL_TABLET | Freq: Every day | ORAL | Status: DC
  Administered 2022-02-19 – 2022-02-20 (×2): 100 mg via ORAL
  Filled 2022-02-19 (×2): qty 1

## 2022-02-19 MED ORDER — MORPHINE SULFATE (PF) 2 MG/ML IV SOLN: 2.0000 mg | Freq: Once | INTRAVENOUS | Status: DC

## 2022-02-19 MED ORDER — ACETAMINOPHEN 650 MG RE SUPP: 650.0000 mg | Freq: Four times a day (QID) | RECTAL | Status: DC | PRN

## 2022-02-19 MED ORDER — LORAZEPAM 1 MG PO TABS: 0.0000 mg | ORAL_TABLET | Freq: Four times a day (QID) | ORAL | Status: DC

## 2022-02-19 MED ORDER — ADULT MULTIVITAMIN W/MINERALS CH: 1.0000 | ORAL_TABLET | Freq: Every day | ORAL | Status: DC

## 2022-02-19 MED ORDER — TRAZODONE HCL 50 MG PO TABS: 150.0000 mg | ORAL_TABLET | Freq: Every evening | ORAL | Status: DC | PRN

## 2022-02-19 MED ORDER — LORATADINE 10 MG PO TABS
10.0000 mg | ORAL_TABLET | Freq: Every day | ORAL | Status: DC
  Administered 2022-02-20: 10 mg via ORAL
  Filled 2022-02-19: qty 1

## 2022-02-19 MED ORDER — FOLIC ACID 1 MG PO TABS
1.0000 mg | ORAL_TABLET | Freq: Every day | ORAL | Status: DC
  Administered 2022-02-19 – 2022-02-20 (×2): 1 mg via ORAL
  Filled 2022-02-19 (×2): qty 1

## 2022-02-19 MED ORDER — ADULT MULTIVITAMIN W/MINERALS CH
1.0000 | ORAL_TABLET | Freq: Every day | ORAL | Status: DC
  Administered 2022-02-20: 1 via ORAL
  Filled 2022-02-19: qty 1

## 2022-02-19 MED ORDER — LORAZEPAM 2 MG/ML IJ SOLN: 1.0000 mg | INTRAMUSCULAR | Status: DC | PRN

## 2022-02-19 MED ORDER — SODIUM CHLORIDE 0.9 % IV SOLN: 250.0000 mL | INTRAVENOUS | Status: DC | PRN

## 2022-02-19 MED ORDER — ALBUTEROL SULFATE (2.5 MG/3ML) 0.083% IN NEBU: 2.5000 mg | INHALATION_SOLUTION | Freq: Four times a day (QID) | RESPIRATORY_TRACT | Status: DC | PRN

## 2022-02-19 MED ORDER — COLCHICINE 0.6 MG PO TABS
1.2000 mg | ORAL_TABLET | Freq: Once | ORAL | Status: AC
  Administered 2022-02-19: 1.2 mg via ORAL
  Filled 2022-02-19: qty 2

## 2022-02-19 MED ORDER — GABAPENTIN 300 MG PO CAPS
300.0000 mg | ORAL_CAPSULE | Freq: Three times a day (TID) | ORAL | Status: DC
  Administered 2022-02-19 (×2): 300 mg via ORAL
  Filled 2022-02-19 (×2): qty 1

## 2022-02-19 NOTE — Assessment & Plan Note (Signed)
Continue Seroquel and trazodone. ?

## 2022-02-19 NOTE — ED Notes (Addendum)
See triage note.  Pt c/o pain and weakness in BLEs which he feels is r/t gout and multiple recent falls.

## 2022-02-19 NOTE — Assessment & Plan Note (Addendum)
Unclear etiology Patient noted to have pain in both knees and has been unable to ambulate due to same ??  Bilateral knee pain may be secondary to pseudogout Trial of steroids Fall precautions Consult orthopedic surgery

## 2022-02-19 NOTE — ED Triage Notes (Signed)
C/O gout pain to bilateral lower legs x 4-5 days.  Patient states he also fell at home a few days ago.  C/O left arm pain from fall where he hit the table.

## 2022-02-19 NOTE — H&P (Signed)
History and Physical    Patient: Perry Bullock NID:[id number] DOB: July 14, 1960 DOA: 02/19/2022 DOS: the patient was seen and examined on 02/19/2022 PCP: [facility]  Patient coming from: Home  Chief Complaint:  Chief Complaint  Patient presents with   Leg Pain   HPI: Perry Bullock is a 61 y.o. male with medical history significant for nicotine dependence, COPD, degenerative disc disease, bipolar disorder, anxiety, blindness in right eye who presents to the ER for evaluation of pain in both lower extremities for 4 days.  Patient states that he has been unable to ambulate due to the severity of the pain.  Pain is mostly in his knees and he rates it a 7 x 10 in intensity at its worst.  Per patient " I have a bad case of gout" At baseline he ambulates with a rolling walker but he has had several falls over the last couple of days and significant bruising involving the right lower extremity.  He denies having any back pain, no urinary or fecal incontinence.  He has no saddle anesthesia. He denies feeling dizzy or lightheaded.  He denies having any chest pain, no shortness of breath, no nausea, no vomiting, no fever, no chills, no changes in his bowel habits or urinary symptoms. He received prednisone and colchicine in the ER but was unable to ambulate and so admission has been requested for further evaluation.  Review of Systems: As mentioned in the history of present illness. All other systems reviewed and are negative. Past Medical History:  Diagnosis Date   Anxiety    Arthritis    Asthma    Bipolar 1 disorder ([place])    Blind right eye    Broken hip ([place])    Chronic back pain    Diverticulosis   Chronic headaches    Convulsion ([place])    once a month when stands up too quickly   COPD (chronic obstructive pulmonary disease) (HCC)    DDD (degenerative disc disease), lumbar    Diverticulitis    GERD (gastroesophageal reflux disease)    Glaucoma    Hepatitis     hepatitis C   Pneumonia    PVD (peripheral vascular disease) (HCC)    lower extremities reddened and feet swollen   Seasonal allergies    Shortness of breath dyspnea    Past Surgical History:  Procedure Laterality Date   BIOPSY  10/09/2021   Procedure: BIOPSY;  Surgeon: Daryel November, MD;  Location: [facility] ENDOSCOPY;  Service: Gastroenterology;;   ESOPHAGOGASTRODUODENOSCOPY (EGD) WITH PROPOFOL N/A 10/09/2021   Procedure: ESOPHAGOGASTRODUODENOSCOPY (EGD) WITH PROPOFOL;  Surgeon: Daryel November, MD;  Location: [facility];  Service: Gastroenterology;  Laterality: N/A;   EYE SURGERY Right    HIP SURGERY Left    INTRAMEDULLARY (IM) NAIL INTERTROCHANTERIC Right 01/16/2017   Procedure: INTRAMEDULLARY (IM) NAIL INTERTROCHANTRIC;  Surgeon: Corky Mull, MD;  Location: [facility] ORS;  Service: Orthopedics;  Laterality: Right;   INTRAMEDULLARY (IM) NAIL INTERTROCHANTERIC Left 03/09/2020   Procedure: INTRAMEDULLARY (IM) NAIL INTERTROCHANTRIC;  Surgeon: Thornton Park, MD;  Location: [facility] ORS;  Service: Orthopedics;  Laterality: Left;   INTUBATION-ENDOTRACHEAL WITH TRACHEOSTOMY STANDBY N/A 04/22/2015   Procedure: INTUBATION-ENDOTRACHEAL WITH TRACHEOSTOMY STANDBY;  Surgeon: Beverly Gust, MD;  Location: [facility] ORS;  Service: ENT;  Laterality: N/A;   JOINT REPLACEMENT Left    Lt shoulder   MANDIBLE SURGERY     Social History:  reports that he has been smoking cigarettes. He has never used smokeless tobacco.  He reports current alcohol use of about 3.0 standard drinks of alcohol per week. He reports that he does not use drugs.  Allergies  Allergen Reactions   Ace Inhibitors Swelling    Angioedema 10/16 requiring intubation @ OSH 2/2 pt reportedly taking someone else's Lisinopril.  Angioedema 10/16 requiring intubation @ OSH 2/2 pt reportedly taking someone else's Lisinopril.     Lisinopril Other (See Comments)    unknown    Family History  Problem Relation Age of Onset   Breast cancer Mother     Hypertension Father    Diabetes Father     Prior to Admission medications   Medication Sig Start Date End Date Taking? Authorizing Provider  pantoprazole (PROTONIX) 40 MG tablet Take 1 tablet (40 mg total) by mouth 2 (two) times daily. 10/11/21 02/19/22 Yes Adhikari, Tamsen Meek, MD  ADVAIR DISKUS 500-50 MCG/ACT AEPB Inhale 1 puff into the lungs in the morning and at bedtime. 07/27/21   [provider]  albuterol (PROVENTIL) (2.5 MG/3ML) 0.083% nebulizer solution Take 3 mLs (2.5 mg total) by nebulization every 6 (six) hours as needed for wheezing or shortness of breath. 10/23/21   Jonetta Osgood, NP  albuterol (VENTOLIN HFA) 108 (90 Base) MCG/ACT inhaler Inhale 2 puffs into the lungs every 6 (six) hours as needed for wheezing or shortness of breath. 10/23/21   Jonetta Osgood, NP  allopurinol (ZYLOPRIM) 100 MG tablet Take 100 mg by mouth daily. 02/07/22   [provider]  bisacodyl (DULCOLAX) 5 MG EC tablet Take 1 tablet (5 mg total) by mouth daily as needed for moderate constipation. Patient taking differently: Take 5 mg by mouth daily. 10/04/21   Lorella Nimrod, MD  cetirizine (ZYRTEC) 10 MG tablet Take 10 mg by mouth daily.    [provider]  diclofenac Sodium (VOLTAREN) 1 % GEL Apply 1 application. topically daily as needed (pain).    [provider]  feeding supplement (ENSURE ENLIVE / ENSURE PLUS) LIQD Take 237 mLs by mouth 2 (two) times daily between meals. 10/04/21   Lorella Nimrod, MD  ferrous [id number] C-folic acid (TRINSICON / FOLTRIN) capsule Take 1 capsule by mouth 2 (two) times daily after a meal. 10/04/21   Lorella Nimrod, MD  ferrous sulfate 325 (65 FE) MG tablet Take 1 tablet (325 mg total) by mouth daily. 10/11/21 10/11/22  Shelly Coss, MD  fluticasone (FLONASE) 50 MCG/ACT nasal spray Place 2 sprays into both nostrils daily as needed for allergies or rhinitis.    [provider]  furosemide (LASIX) 40 MG tablet Take 1 tablet (40 mg  total) by mouth daily. 10/11/21 10/11/22  Shelly Coss, MD  gabapentin (NEURONTIN) 300 MG capsule Take 300 mg by mouth 3 (three) times daily. 08/26/21   [provider]  latanoprost (XALATAN) 0.005 % ophthalmic solution Place 1 drop into both eyes at bedtime. 08/31/21   [provider]  lidocaine (LIDODERM) 5 % 1 patch daily. 10/24/21   [provider]  mometasone-formoterol (DULERA) 100-5 MCG/ACT AERO Inhale 2 puffs by mouth twice daily 02/18/22   Jonetta Osgood, NP  mometasone-formoterol (DULERA) 200-5 MCG/ACT AERO Inhale 2 puffs into the lungs 2 (two) times daily. 10/23/21   Jonetta Osgood, NP  Multiple Vitamin (MULTIVITAMIN WITH MINERALS) TABS tablet Take 1 tablet by mouth daily. 03/15/20   Nolberto Hanlon, MD  nicotine (NICODERM CQ - DOSED IN MG/24 HOURS) 21 mg/24hr patch Place 1 patch (21 mg total) onto the skin daily. 10/23/21   Jonetta Osgood, NP  oxyCODONE (OXY  IR/ROXICODONE) 5 MG immediate release tablet Take 1 tablet (5 mg total) by mouth every 4 (four) hours as needed. 10/04/21   Lorella Nimrod, MD  Oxycodone HCl 10 MG TABS Take by mouth. 01/21/22   [provider]  QUEtiapine (SEROQUEL XR) 300 MG 24 hr tablet Take 150 mg by mouth 2 (two) times daily. 07/29/21   [provider]  sucralfate (CARAFATE) 1 g tablet Take 1 g by mouth 4 (four) times daily. 10/29/21   [provider]  thiamine 100 MG tablet Take 1 tablet (100 mg total) by mouth daily. 03/15/20   Nolberto Hanlon, MD  Tiotropium Bromide Monohydrate (SPIRIVA RESPIMAT) 2.5 MCG/ACT AERS Inhale 2 puffs into the lungs daily. 10/23/21   Jonetta Osgood, NP  traZODone (DESYREL) 150 MG tablet Take 150 mg by mouth at bedtime as needed. 10/25/21   [provider]  zolpidem (AMBIEN) 10 MG tablet Take 10 mg by mouth at bedtime. 02/01/22   [provider]    Physical Exam: Vitals:   02/19/22 1315 02/19/22 1315 02/19/22 1330 02/19/22 1345  BP: 103/70  96/74   Pulse: (!) 101  (!)  107 (!) 105  Resp:      Temp:  98.2 F (36.8 C)    TempSrc:  Oral    SpO2: 96%  98% 99%  Weight:      Height:       Physical Exam Vitals and nursing note reviewed.  Constitutional:      Comments: Chronically ill-appearing, disheveled  HENT:     Head: Normocephalic.     Nose: Nose normal.     Mouth/Throat:     Mouth: Mucous membranes are moist.  Eyes:     Comments: Pale conjunctiva  Cardiovascular:     Rate and Rhythm: Normal rate and regular rhythm.  Pulmonary:     Effort: Pulmonary effort is normal.     Breath sounds: Normal breath sounds.  Abdominal:     General: Abdomen is flat. Bowel sounds are normal.     Palpations: Abdomen is soft.  Musculoskeletal:        General: Swelling present.     Cervical back: Normal range of motion and neck supple.     Comments: Bilateral knee swelling  Skin:    General: Skin is warm and dry.  Neurological:     Mental Status: He is alert.     Motor: Weakness present.  Psychiatric:        Mood and Affect: Mood normal.        Behavior: Behavior normal.     Data Reviewed: Relevant notes from primary care and specialist visits, past discharge summaries as available in EHR, including Care Everywhere. Prior diagnostic testing as pertinent to current admission diagnoses Updated medications and problem lists for reconciliation ED course, including vitals, labs, imaging, treatment and response to treatment Triage notes, nursing and pharmacy notes and ED provider's notes Notable results as noted in HPI Labs reviewed.  Sodium 127, potassium 4.6, chloride 95, bicarb 17, glucose 76, BUN less than 5, creatinine 0.38, calcium 7.7, total protein 5.6, albumin 2.8, AST 54, ALT 38, alk phos 103, total bili 0.9, uric acid 5.9, white count 10.8, hemoglobin 11.7, hematocrit 33.8, MCV 102.1, platelet count 169 Twelve-lead EKG reviewed by me shows normal sinus rhythm with a right bundle branch block. There are no new results to review at this  time.  Assessment and Plan: * Bilateral leg pain Unclear etiology Patient noted to have pain in both  knees and has been unable to ambulate due to same ??  Bilateral knee pain may be secondary to pseudogout Trial of steroids Fall precautions Consult orthopedic surgery    Alcohol abuse Place patient on CIWA protocol and administer lorazepam for CIWA score of 8 or greater Start patient on MVI/thiamine/folic acid  Tobacco abuse Smoking cessation was discussed with patient in detail He declines a nicotine transdermal patch  Macrocytic anemia Patient has chronic microcytic anemia We will obtain vitamin B12 and folate levels  COPD (chronic obstructive pulmonary disease) (HCC) Stable and not acutely exacerbated Continue as needed bronchodilator therapy and inhaled steroids  Bipolar 1 disorder (HCC) Continue Seroquel and trazodone  GERD (gastroesophageal reflux disease) Stable Continue PPI      Advance Care Planning:   Code Status: Full Code   Consults: Orthopedic surgery  Family Communication: Greater than 50% of time was spent discussing patient's condition and plan of care with him at the bedside.  All questions and concerns have been addressed.  He verbalizes understanding and agrees with the plan.  Severity of Illness: The appropriate patient status for this patient is INPATIENT. Inpatient status is judged to be reasonable and necessary in order to provide the required intensity of service to ensure the patient's safety. The patient's presenting symptoms, physical exam findings, and initial radiographic and laboratory data in the context of their chronic comorbidities is felt to place them at high risk for further clinical deterioration. Furthermore, it is not anticipated that the patient will be medically stable for discharge from the hospital within 2 midnights of admission.   * I certify that at the point of admission it is my clinical judgment that the patient will  require inpatient hospital care spanning beyond 2 midnights from the point of admission due to high intensity of service, high risk for further deterioration and high frequency of surveillance required.*  Author: Collier Bullock, MD 02/19/2022 3:39 PM  For on call review [email or web address].

## 2022-02-19 NOTE — Assessment & Plan Note (Signed)
Stable.  Continue PPI. 

## 2022-02-19 NOTE — ED Provider Notes (Signed)
[facility] Provider Note    Event Date/Time   First MD Initiated Contact with Patient 02/19/22 1016     (approximate)   History   Leg Pain   HPI  Perry Bullock is a 61 y.o. male with a history of COPD, asthma, osteoarthritis, polysubstance abuse, seizure disorder, CHF, hepatitis C, gout, bipolar disorder, and anxiety who presents with bilateral leg pain over the last several days.  He states that the pain is similar to prior flares of gout.  He is on allopurinol but does not have any medication for an acute flare.  He states that over the last several days the pain has gotten bad enough in his knees and lower legs that he has been unable to walk and has fallen a few times.  He fell a few days ago and caused a skin tear on his left forearm.  The patient denies any fever, vomiting, chest pain, or other acute symptoms.    Physical Exam   Triage Vital Signs: ED Triage Vitals  Enc Vitals Group     BP 02/19/22 0908 90/74     Pulse Rate 02/19/22 0908 100     Resp 02/19/22 0908 17     Temp 02/19/22 0908 (!) 97.5 F (36.4 C)     Temp Source 02/19/22 0908 Oral     SpO2 02/19/22 0908 100 %     Weight 02/19/22 0917 154 lb 15.7 oz (70.3 kg)     Height 02/19/22 0917 5\' 11"  (1.803 m)     Head Circumference --      Peak Flow --      Pain Score 02/19/22 0917 8     Pain Loc --      Pain Edu? --      Excl. in GC? --     Most recent vital signs: Vitals:   02/19/22 1314 02/19/22 1315  BP: 103/70   Pulse: (!) 101   Resp: 16   Temp:  98.2 F (36.8 C)  SpO2: 97%      General: Awake, no distress.  CV:  Good peripheral perfusion.  Resp:  Normal effort.  Abd:  No distention.  Other:  Bilateral lower extremities with swelling to knees, lower legs, and feet.  Faint erythema to bilateral feet.  Pain on range of motion of bilateral knees and ankles.  No deformity.  2+ DP pulses bilaterally.  Cap refill less than 2 seconds.  Left forearm with approximately 5 cm  superficial skin tear.   ED Results / Procedures / Treatments   Labs (all labs ordered are listed, but only abnormal results are displayed) Labs Reviewed  CBC WITH DIFFERENTIAL/PLATELET - Abnormal; Notable for the following components:      Result Value   WBC 10.8 (*)    RBC 3.31 (*)    Hemoglobin 11.7 (*)    HCT 33.8 (*)    MCV 102.1 (*)    MCH 35.3 (*)    Neutro Abs 8.5 (*)    Monocytes Absolute 1.1 (*)    All other components within normal limits  COMPREHENSIVE METABOLIC PANEL - Abnormal; Notable for the following components:   Sodium 127 (*)    Chloride 95 (*)    CO2 17 (*)    BUN <5 (*)    Creatinine, Ser 0.38 (*)    Calcium 7.7 (*)    Total Protein 5.6 (*)    Albumin 2.8 (*)    AST 54 (*)  All other components within normal limits  URIC ACID  BRAIN NATRIURETIC PEPTIDE     EKG  ED ECG REPORT I, Dionne Bucy, the attending physician, personally viewed and interpreted this ECG.  Date: 02/19/2022 EKG Time: 1051 Rate: 99 Rhythm: normal sinus rhythm QRS Axis: normal Intervals: RBBB ST/T Wave abnormalities: normal Narrative Interpretation: no evidence of acute ischemia; no significant change when compared to EKG of 10/06/2021    RADIOLOGY    PROCEDURES:  Critical Care performed: No  Procedures   MEDICATIONS ORDERED IN ED: Medications  predniSONE (DELTASONE) tablet 60 mg (60 mg Oral Given 02/19/22 1040)  sodium chloride 0.9 % bolus 1,000 mL (0 mLs Intravenous Stopped 02/19/22 1246)  colchicine tablet 1.2 mg (1.2 mg Oral Given 02/19/22 1039)     IMPRESSION / MDM / ASSESSMENT AND PLAN / ED COURSE  I reviewed the triage vital signs and the nursing notes.  61 year old male with PMH as noted above presents with bilateral knee and lower leg pain which he attributes to gout. I reviewed the past medical records.    Per the hospitalist discharge summary from 10/11/2021, the patient was admitted at that time with abdominal pain and CT showing SBO  versus ileus.  He had coffee-ground emesis and EGD showed esophagitis.  Differential diagnosis includes, but is not limited to, gout, osteoarthritis, edema related to CHF, less likely cellulitis.  Patient's presentation is most consistent with severe exacerbation of chronic illness.  Initial lab work-up is significant for hyponatremia.  Uric acid is normal.  There is no significant leukocytosis.  We will give fluids, prednisone, colchicine, and reassess.  I suspect the patient may require admission given that he is unable to walk.  ----------------------------------------- 1:17 PM on 02/19/2022 -----------------------------------------  The patient reports minimal improvement in his pain.  I consulted Dr. Joylene Igo from the hospitalist service; based on her discussion she agrees to admit the patient.   FINAL CLINICAL IMPRESSION(S) / ED DIAGNOSES   Final diagnoses:  Bilateral leg pain  Hyponatremia     Rx / DC Orders   ED Discharge Orders     None        Note:  This document was prepared using Dragon voice recognition software and may include unintentional dictation errors.    Dionne Bucy, MD 02/19/22 [phone]

## 2022-02-19 NOTE — Assessment & Plan Note (Signed)
Patient has chronic microcytic anemia We will obtain vitamin B12 and folate levels

## 2022-02-19 NOTE — Progress Notes (Signed)
       CROSS COVER NOTE  NAME: AMES HOBAN MRN: [id number] DOB : 24-Oct-1960    Date of Service   02/19/2022   HPI/Events of Note   Mr Banko is reporting 10/10 bilateral leg pain refractory to Norco.  Interventions   Plan: Morphine x1      This document was prepared using Dragon voice recognition software and may include unintentional dictation errors.  Bishop Limbo DNP, MHA, FNP-BC Nurse Practitioner [facility] Pager [phone]

## 2022-02-19 NOTE — Assessment & Plan Note (Signed)
Smoking cessation was discussed with patient in detail He declines a nicotine transdermal patch 

## 2022-02-19 NOTE — ED Notes (Signed)
Lab notified of add on

## 2022-02-19 NOTE — Assessment & Plan Note (Signed)
Place patient on CIWA protocol and administer lorazepam for CIWA score of 8 or greater Start patient on MVI/thiamine/folic acid

## 2022-02-19 NOTE — Assessment & Plan Note (Signed)
Stable and not acutely exacerbated Continue as needed bronchodilator therapy and inhaled steroids 

## 2022-02-20 ENCOUNTER — Encounter (HOSPITAL_COMMUNITY): Payer: Self-pay

## 2022-02-20 ENCOUNTER — Observation Stay: Payer: Medicaid Other

## 2022-02-20 DIAGNOSIS — M79605 Pain in left leg: Secondary | ICD-10-CM | POA: Diagnosis not present

## 2022-02-20 DIAGNOSIS — M79604 Pain in right leg: Secondary | ICD-10-CM | POA: Diagnosis not present

## 2022-02-20 LAB — CBC
HCT: 24.6 % — ABNORMAL LOW (ref 39.0–52.0)
Hemoglobin: 8.6 g/dL — ABNORMAL LOW (ref 13.0–17.0)
MCH: 35 pg — ABNORMAL HIGH (ref 26.0–34.0)
MCHC: 35 g/dL (ref 30.0–36.0)
MCV: 100 fL (ref 80.0–100.0)
Platelets: 103 10*3/uL — ABNORMAL LOW (ref 150–400)
RBC: 2.46 MIL/uL — ABNORMAL LOW (ref 4.22–5.81)
RDW: 11.9 % (ref 11.5–15.5)
WBC: 6.6 10*3/uL (ref 4.0–10.5)
nRBC: 0 % (ref 0.0–0.2)

## 2022-02-20 LAB — BASIC METABOLIC PANEL
Anion gap: 7 (ref 5–15)
BUN: 8 mg/dL (ref 8–23)
CO2: 23 mmol/L (ref 22–32)
Calcium: 7.4 mg/dL — ABNORMAL LOW (ref 8.9–10.3)
Chloride: 99 mmol/L (ref 98–111)
Creatinine, Ser: 0.43 mg/dL — ABNORMAL LOW (ref 0.61–1.24)
GFR, Estimated: >60 mL/min (ref >60)
Glucose, Bld: 191 mg/dL — ABNORMAL HIGH (ref 70–99)
Potassium: 4.3 mmol/L (ref 3.5–5.1)
Sodium: 129 mmol/L — ABNORMAL LOW (ref 135–145)

## 2022-02-20 MED ORDER — PREDNISONE 50 MG PO TABS
50.0000 mg | ORAL_TABLET | Freq: Every day | ORAL | Status: DC
Start: 2022-02-20 — End: 2022-02-20
  Administered 2022-02-20: 50 mg via ORAL
  Filled 2022-02-20: qty 1

## 2022-02-20 MED ORDER — OXYCODONE HCL 5 MG PO TABS
5.0000 mg | ORAL_TABLET | ORAL | Status: DC | PRN
  Administered 2022-02-20 – 2022-02-21 (×3): 10 mg via ORAL
  Filled 2022-02-20 (×4): qty 2

## 2022-02-20 MED ORDER — MORPHINE SULFATE (PF) 2 MG/ML IV SOLN
2.0000 mg | INTRAVENOUS | Status: DC | PRN
  Administered 2022-02-20: 2 mg via INTRAVENOUS
  Filled 2022-02-20 (×2): qty 1

## 2022-02-20 MED ORDER — GABAPENTIN 400 MG PO CAPS
400.0000 mg | ORAL_CAPSULE | Freq: Three times a day (TID) | ORAL | Status: DC
  Administered 2022-02-20 (×3): 400 mg via ORAL
  Filled 2022-02-20 (×3): qty 1

## 2022-02-20 NOTE — Plan of Care (Signed)

## 2022-02-20 NOTE — Consult Note (Signed)
ORTHOPAEDIC CONSULTATION  REQUESTING PHYSICIAN: Tresa Moore, MD  Chief Complaint: bilateral knee pain  HPI: MAYCOL HOYING is a 61 y.o. male who complains of bilateral knee pain.  Patient is accompanied by his partner who is also providing history.  They state that he has had multiple falls in recent days and yesterday was having difficulty ambulating so came to the ER.  Patient was admitted to the hospitalist team due to concerns over difficulty with ambulation, leg swelling, and potential concerns regarding gout.  Orthopedics was consulted.  Bilateral knee x-rays were ordered which showed presence of a right proximal tibia fracture and left distal femur fracture.   Past Medical History:  Diagnosis Date   Anxiety    Arthritis    Asthma    Bipolar 1 disorder (HCC)    Blind right eye    Broken hip (HCC)    Chronic back pain    Diverticulosis   Chronic headaches    Convulsion (HCC)    once a month when stands up too quickly   COPD (chronic obstructive pulmonary disease) (HCC)    DDD (degenerative disc disease), lumbar    Diverticulitis    GERD (gastroesophageal reflux disease)    Glaucoma    Hepatitis    hepatitis C   Pneumonia    PVD (peripheral vascular disease) (HCC)    lower extremities reddened and feet swollen   Seasonal allergies    Shortness of breath dyspnea    Past Surgical History:  Procedure Laterality Date   BIOPSY  10/09/2021   Procedure: BIOPSY;  Surgeon: Jenel Lucks, MD;  Location: [facility] ENDOSCOPY;  Service: Gastroenterology;;   ESOPHAGOGASTRODUODENOSCOPY (EGD) WITH PROPOFOL N/A 10/09/2021   Procedure: ESOPHAGOGASTRODUODENOSCOPY (EGD) WITH PROPOFOL;  Surgeon: Jenel Lucks, MD;  Location: [facility] ENDOSCOPY;  Service: Gastroenterology;  Laterality: N/A;   EYE SURGERY Right    HIP SURGERY Left    INTRAMEDULLARY (IM) NAIL INTERTROCHANTERIC Right 01/16/2017   Procedure: INTRAMEDULLARY (IM) NAIL INTERTROCHANTRIC;  Surgeon: Christena Flake, MD;   Location: [facility] ORS;  Service: Orthopedics;  Laterality: Right;   INTRAMEDULLARY (IM) NAIL INTERTROCHANTERIC Left 03/09/2020   Procedure: INTRAMEDULLARY (IM) NAIL INTERTROCHANTRIC;  Surgeon: Juanell Fairly, MD;  Location: [facility] ORS;  Service: Orthopedics;  Laterality: Left;   INTUBATION-ENDOTRACHEAL WITH TRACHEOSTOMY STANDBY N/A 04/22/2015   Procedure: INTUBATION-ENDOTRACHEAL WITH TRACHEOSTOMY STANDBY;  Surgeon: Linus Salmons, MD;  Location: [facility] ORS;  Service: ENT;  Laterality: N/A;   JOINT REPLACEMENT Left    Lt shoulder   MANDIBLE SURGERY     Social History   Socioeconomic History   Marital status: Single    Spouse name: Not on file   Number of children: Not on file   Years of education: Not on file   Highest education level: Not on file  Occupational History   Not on file  Tobacco Use   Smoking status: Every Day    Packs/day: 0.00    Years: 35.00    Total pack years: 0.00    Types: Cigarettes   Smokeless tobacco: Never   Tobacco comments:    4 cigs daily--02/27/16, pt has patch  Substance and Sexual Activity   Alcohol use: Yes    Alcohol/week: 3.0 standard drinks of alcohol    Types: 3 Cans of beer per week    Comment: pt is cutting back to 3-4 daily   Drug use: No   Sexual activity: Yes    Birth control/protection: None  Other Topics Concern   Not on  file  Social History Narrative   Not on file   Social Determinants of Health   Financial Resource Strain: Not on file  Food Insecurity: Not on file  Transportation Needs: Not on file  Physical Activity: Not on file  Stress: Not on file  Social Connections: Not on file   Family History  Problem Relation Age of Onset   Breast cancer Mother    Hypertension Father    Diabetes Father    Allergies  Allergen Reactions   Ace Inhibitors Swelling    Angioedema 10/16 requiring intubation @ OSH 2/2 pt reportedly taking someone else's Lisinopril.  Angioedema 10/16 requiring intubation @ OSH 2/2 pt reportedly taking  someone else's Lisinopril.     Lisinopril Other (See Comments)    unknown   Prior to Admission medications   Medication Sig Start Date End Date Taking? Authorizing Provider  ADVAIR DISKUS 500-50 MCG/ACT AEPB Inhale 1 puff into the lungs in the morning and at bedtime. 07/27/21  Yes [provider]  albuterol (PROVENTIL) (2.5 MG/3ML) 0.083% nebulizer solution Take 3 mLs (2.5 mg total) by nebulization every 6 (six) hours as needed for wheezing or shortness of breath. 10/23/21  Yes Abernathy, Alyssa, NP  albuterol (VENTOLIN HFA) 108 (90 Base) MCG/ACT inhaler Inhale 2 puffs into the lungs every 6 (six) hours as needed for wheezing or shortness of breath. 10/23/21  Yes Abernathy, Alyssa, NP  allopurinol (ZYLOPRIM) 100 MG tablet Take 100 mg by mouth daily. 02/07/22  Yes [provider]  cetirizine (ZYRTEC) 10 MG tablet Take 10 mg by mouth daily.   Yes [provider]  diclofenac Sodium (VOLTAREN) 1 % GEL Apply 1 application. topically daily as needed (pain).   Yes [provider]  ferrous fumarate-b12-vitamic C-folic acid (TRINSICON / FOLTRIN) capsule Take 1 capsule by mouth 2 (two) times daily after a meal. 10/04/21  Yes Arnetha Courser, MD  ferrous sulfate 325 (65 FE) MG tablet Take 1 tablet (325 mg total) by mouth daily. 10/11/21 10/11/22 Yes Burnadette Pop, MD  fluticasone (FLONASE) 50 MCG/ACT nasal spray Place 2 sprays into both nostrils daily as needed for allergies or rhinitis.   Yes [provider]  gabapentin (NEURONTIN) 300 MG capsule Take 300 mg by mouth 3 (three) times daily. 08/26/21  Yes [provider]  latanoprost (XALATAN) 0.005 % ophthalmic solution Place 1 drop into both eyes at bedtime. 08/31/21  Yes [provider]  mometasone-formoterol (DULERA) 100-5 MCG/ACT AERO Inhale 2 puffs by mouth twice daily 02/18/22  Yes Abernathy, Alyssa, NP  Multiple Vitamin (MULTIVITAMIN WITH MINERALS) TABS tablet Take 1 tablet by mouth daily. 03/15/20   Yes Lynn Ito, MD  oxyCODONE (OXY IR/ROXICODONE) 5 MG immediate release tablet Take 1 tablet (5 mg total) by mouth every 4 (four) hours as needed. 10/04/21  Yes Arnetha Courser, MD  pantoprazole (PROTONIX) 40 MG tablet Take 1 tablet (40 mg total) by mouth 2 (two) times daily. 10/11/21 02/19/22 Yes Burnadette Pop, MD  QUEtiapine (SEROQUEL XR) 300 MG 24 hr tablet Take 150 mg by mouth 2 (two) times daily. 07/29/21  Yes [provider]  sucralfate (CARAFATE) 1 g tablet Take 1 g by mouth 4 (four) times daily. 10/29/21  Yes [provider]  thiamine 100 MG tablet Take 1 tablet (100 mg total) by mouth daily. 03/15/20  Yes Lynn Ito, MD  Tiotropium Bromide Monohydrate (SPIRIVA RESPIMAT) 2.5 MCG/ACT AERS Inhale 2 puffs into the lungs daily. 10/23/21  Yes Sallyanne Kuster, NP  traZODone (DESYREL) 150  MG tablet Take 150 mg by mouth at bedtime as needed. 10/25/21  Yes [provider]  zolpidem (AMBIEN) 10 MG tablet Take 10 mg by mouth at bedtime. 02/01/22  Yes [provider]  bisacodyl (DULCOLAX) 5 MG EC tablet Take 1 tablet (5 mg total) by mouth daily as needed for moderate constipation. Patient taking differently: Take 5 mg by mouth daily. 10/04/21   Arnetha Courser, MD  feeding supplement (ENSURE ENLIVE / ENSURE PLUS) LIQD Take 237 mLs by mouth 2 (two) times daily between meals. 10/04/21   Arnetha Courser, MD  furosemide (LASIX) 40 MG tablet Take 1 tablet (40 mg total) by mouth daily. Patient not taking: Reported on 02/19/2022 10/11/21 10/11/22  Burnadette Pop, MD  lidocaine (LIDODERM) 5 % 1 patch daily. Patient not taking: Reported on 02/19/2022 10/24/21   [provider]  mometasone-formoterol (DULERA) 200-5 MCG/ACT AERO Inhale 2 puffs into the lungs 2 (two) times daily. Patient not taking: Reported on 02/19/2022 10/23/21   Sallyanne Kuster, NP  nicotine (NICODERM CQ - DOSED IN MG/24 HOURS) 21 mg/24hr patch Place 1 patch (21 mg total) onto the skin daily. 10/23/21   Sallyanne Kuster, NP  Oxycodone HCl 10 MG TABS Take by mouth. 01/21/22   [provider]   CT KNEE RIGHT WO CONTRAST  Result Date: 02/20/2022 CLINICAL DATA:  Right knee pain.  Multiple recent falls. EXAM: CT OF THE RIGHT KNEE WITHOUT CONTRAST TECHNIQUE: Multidetector CT imaging of the right knee was performed according to the standard protocol. Multiplanar CT image reconstructions were also generated. RADIATION DOSE REDUCTION: This exam was performed according to the departmental dose-optimization program which includes automated exposure control, adjustment of the mA and/or kV according to patient size and/or use of iterative reconstruction technique. COMPARISON:  Right knee x-rays from yesterday. FINDINGS: Bones/Joint/Cartilage Acute minimally impacted fracture of the proximal tibial metadiaphysis with longitudinal intra-articular extension through the medial tibial spine. Acute mildly impacted fracture of the fibular head and neck. Partially visualized distal femoral intramedullary rod with fracture of the interlocking screw. Joint spaces are preserved. Small lipohemarthrosis. Severe osteopenia. Ligaments Ligaments are suboptimally evaluated by CT. Muscles and Tendons Grossly intact. Soft tissue No fluid collection or hematoma.  No soft tissue mass. IMPRESSION: 1. Acute minimally impacted fracture of the proximal tibial metadiaphysis with longitudinal intra-articular extension through the medial tibial spine. 2. Acute mildly impacted fracture of the fibular head and neck. 3. Small lipohemarthrosis. 4. Partially visualized distal femoral intramedullary rod with fracture of the interlocking screw. Electronically Signed   By: Obie Dredge M.D.   On: 02/20/2022 10:34   CT KNEE LEFT WO CONTRAST  Result Date: 02/20/2022 CLINICAL DATA:  Left knee pain after several falls over the last few days. EXAM: CT OF THE LEFT KNEE WITHOUT CONTRAST TECHNIQUE: Multidetector CT imaging of the left knee was performed  according to the standard protocol. Multiplanar CT image reconstructions were also generated. RADIATION DOSE REDUCTION: This exam was performed according to the departmental dose-optimization program which includes automated exposure control, adjustment of the mA and/or kV according to patient size and/or use of iterative reconstruction technique. COMPARISON:  Left knee x-rays from yesterday. FINDINGS: Bones/Joint/Cartilage Acute minimally impacted transverse intra-articular fracture of the distal femoral metaphysis, also involving the medial epicondyle. Partially visualized femoral intramedullary nail and distal interlocking screws, which do not involve the fracture. No dislocation. Joint spaces are preserved. Large lipohemarthrosis. Severe osteopenia. Ligaments Ligaments are suboptimally evaluated by CT. Muscles and Tendons Grossly intact. Soft tissue No fluid  collection or hematoma.  No soft tissue mass. IMPRESSION: 1. Acute minimally impacted transverse intra-articular fracture of the distal femoral metaphysis. 2. Large lipohemarthrosis. Electronically Signed   By: Obie DredgeWilliam T Derry M.D.   On: 02/20/2022 10:28   DG Knee Complete 4 Views Left  Result Date: 02/19/2022 CLINICAL DATA:  Several falls over the last few days. EXAM: LEFT KNEE - COMPLETE 4+ VIEW COMPARISON:  None Available. FINDINGS: There is a comminuted and minimally displaced distal femur fracture. There is involvement of the medial and lateral metaphysis is well as the medial femoral condyle. No definite fracture line is seen. There is a large joint effusion, however no convincing lipohemarthrosis. No fracture of the proximal tibia or fibula. The bones are diffusely under mineralized. Intramedullary nail with distal locking screws partially visualized, fracture does not seen to traverse the hardware. IMPRESSION: 1. Comminuted and minimally displaced distal femur fracture primarily involving the metaphysis. 2. Large joint effusion. 3. Intramedullary  nail with distal locking screws partially visualized, fracture does not seen to traverse the hardware. Electronically Signed   By: Narda RutherfordMelanie  Sanford M.D.   On: 02/19/2022 18:00   DG Knee Complete 4 Views Right  Result Date: 02/19/2022 CLINICAL DATA:  Several falls over the last few days with bruising. EXAM: RIGHT KNEE - COMPLETE 4+ VIEW COMPARISON:  None Available. FINDINGS: Mildly comminuted but essentially nondisplaced proximal tibial fracture. Fracture involves the metaphysis, however there is a lipohemarthrosis which suggests intra-articular involvement, although no intra-articular fracture line is seen on the current exam. Suspect impacted fracture of the proximal fibula. Femoral intramedullary rod with distal locking screws is partially included. There is no evidence of distal femur fracture. The bones are diffusely under mineralized. Vascular calcifications are seen. IMPRESSION: 1. Mildly comminuted but essentially nondisplaced proximal tibial fracture involving the metaphysis. There is a lipohemarthrosis which is indicative of intra-articular involvement, although intra-articular fracture line is not seen by radiograph. Recommend CT. 2. Suspect impacted fracture of the proximal fibula. Electronically Signed   By: Narda RutherfordMelanie  Sanford M.D.   On: 02/19/2022 17:58    Positive ROS: All other systems have been reviewed and were otherwise negative with the exception of those mentioned in the HPI and as above.  Physical Exam: General: Alert, no acute distress Cardiovascular: No pedal edema Respiratory: No cyanosis, no use of accessory musculature GI: No organomegaly, abdomen is soft and non-tender Skin: No lesions in the area of chief complaint Neurologic: Sensation intact distally Psychiatric: Patient is competent for consent with normal mood and affect Lymphatic: No axillary or cervical lymphadenopathy  MUSCULOSKELETAL:   Right lower extremity: Knee is resting in approximately 20 degrees of  flexion, mild effusion, ecchymosis and bruising along the proximal aspect of the leg.  Tenderness about the knee and proximal tibia.  Range of motion of the knee is deferred.  Compartments x4 are soft and compressible.  Motor and sensory intact distally.  Good cap refill.  Left lower extremity: Knee is resting approximate 20 degrees of flexion, moderate effusion, tenderness about the distal femur and knee.  Range of motion of the knee is deferred.  Motor and sensory intact distally.  Good cap refill.   Assessment: 61 year old male admitted with a minimally displaced intra-articular left distal femur fracture and a minimally displaced right metadiaphyseal proximal tibia fracture.  Plan: I had a discussion with the patient and his partner regarding his fractures and potential treatment options.  I also discussed this case [facility] Myrene GalasMichael Handy.  Plan will be for transfer to [facility] in [place] for operative fixation by Dr. Carola Frost.  We discussed use of knee immobilizers as needed for temporary immobilization.  The patient and his partner expressed understanding with the plan.   Ross Marcus, MD    02/20/2022 1:31 PM

## 2022-02-20 NOTE — Progress Notes (Signed)
Pt still yellow mews, Charge nurse aware and Hospitalist. "Asymptomatic. no chest pain, no Shortness of breath, no N/V, just tachycardia, heart rates 110-120's.  Vitals monitored per protocol for yellow mews. CIWA- 0. "

## 2022-02-20 NOTE — Discharge Summary (Signed)
Physician Discharge Summary  Perry Bullock AYT:[id number] DOB: 04/24/1961 DOA: 02/19/2022  PCP: [facility]  Admit date: 02/19/2022 Discharge date: 02/20/2022  Admitted From: Home Disposition:  Transfer to [facility]   Home Health:NA Equipment/Devices:None  Discharge Condition:Stable  CODE STATUS:FULL  Diet recommendation: Reg  Brief/Interim Summary: 61 y.o. male with medical history significant for nicotine dependence, COPD, degenerative disc disease, bipolar disorder, anxiety, blindness in right eye who presents to the ER for evaluation of pain in both lower extremities for 4 days.  Patient states that he has been unable to ambulate due to the severity of the pain.  Pain is mostly in his knees and he rates it a 7 x 10 in intensity at its worst.  Per patient " I have a bad case of gout" At baseline he ambulates with a rolling walker but he has had several falls over the last couple of days and significant bruising involving the right lower extremity.  He denies having any back pain, no urinary or fecal incontinence.  He has no saddle anesthesia.  Orthopedics consulted.  Considering possible crystalline arthropathy.  Uric acid normal.  Speaks away from crystalline arthropathy.  Lower extremity plain film x-ray significant for left distal femur fracture and right proximal tibial fracture.  Follow-up CT confirms the same.  Case discussed with [facility] orthopedic surgeon Dr. Okey Dupre.  Considering complexity of patient's orthopedic presentation this case will be referred to trauma orthopedics at [facility].  Appreciate Dr. Frutoso Chase help.  He discussed the case directly with Dr. Myrene Galas trauma orthopedic surgery at [facility] who agreed to see the patient in consultation  Patient will be transferred in stable condition.  Please notify Dr. Magdalene Patricia team when patient arrives at [facility]    Discharge Diagnoses:  Principal Problem:   Bilateral leg pain Active  Problems:   Alcohol abuse   Tobacco abuse   GERD (gastroesophageal reflux disease)   Bipolar 1 disorder (HCC)   COPD (chronic obstructive pulmonary disease) (HCC)   Macrocytic anemia  Severe bilateral leg pain And did ambulate Left distal femur fracture Right proximal tibia fracture Unclear etiology of orthopedic injuries.  Patient has a history of orthopedic surgery with hardware in place.  Complexity of orthopedic presentation requires a higher level of care.  Patient will be transferred to [facility].  Admitted by [facility] hospitalist service.  Orthopedic surgery consulting.  Please notify Dr. Myrene Galas or his team when patient arrives at [facility]  Alcohol abuse No signs of withdrawal while patient admitted to [facility].  Was on CIWA protocol   Tobacco abuse Smoking cessation was discussed with patient in detail He declines a nicotine transdermal patch   Macrocytic anemia Patient has chronic microcytic anemia Likely nutritional.  B12 and folate ordered and pending   COPD (chronic obstructive pulmonary disease) (HCC) Stable and not acutely exacerbated Continue as needed bronchodilator therapy and inhaled steroids   Bipolar 1 disorder (HCC) Continue Seroquel and trazodone   GERD (gastroesophageal reflux disease) Stable Continue PPI    Discharge Instructions  Discharge Instructions     Diet - low sodium heart healthy   Complete by: As directed    Increase activity slowly   Complete by: As directed    No wound care   Complete by: As directed       Allergies as of 02/20/2022       Reactions   Ace Inhibitors Swelling   Angioedema 10/16 requiring intubation @ OSH 2/2 pt reportedly taking  someone else's Lisinopril.  Angioedema 10/16 requiring intubation @ OSH 2/2 pt reportedly taking someone else's Lisinopril.    Lisinopril Other (See Comments)   unknown        Medication List     STOP taking these medications    furosemide 40 MG tablet Commonly known as: Lasix    lidocaine 5 % Commonly known as: LIDODERM       TAKE these medications    Advair Diskus 500-50 MCG/ACT Aepb Generic drug: fluticasone-salmeterol Inhale 1 puff into the lungs in the morning and at bedtime.   albuterol 108 (90 Base) MCG/ACT inhaler Commonly known as: VENTOLIN HFA Inhale 2 puffs into the lungs every 6 (six) hours as needed for wheezing or shortness of breath.   albuterol (2.5 MG/3ML) 0.083% nebulizer solution Commonly known as: PROVENTIL Take 3 mLs (2.5 mg total) by nebulization every 6 (six) hours as needed for wheezing or shortness of breath.   allopurinol 100 MG tablet Commonly known as: ZYLOPRIM Take 100 mg by mouth daily.   bisacodyl 5 MG EC tablet Commonly known as: DULCOLAX Take 1 tablet (5 mg total) by mouth daily as needed for moderate constipation. What changed: when to take this   cetirizine 10 MG tablet Commonly known as: ZYRTEC Take 10 mg by mouth daily.   diclofenac Sodium 1 % Gel Commonly known as: VOLTAREN Apply 1 application. topically daily as needed (pain).   Dulera 100-5 MCG/ACT Aero Generic drug: mometasone-formoterol Inhale 2 puffs by mouth twice daily What changed: Another medication with the same name was removed. Continue taking this medication, and follow the directions you see here.   feeding supplement Liqd Take 237 mLs by mouth 2 (two) times daily between meals.   ferrous fumarate-b12-vitamic C-folic acid capsule Commonly known as: TRINSICON / FOLTRIN Take 1 capsule by mouth 2 (two) times daily after a meal.   ferrous sulfate 325 (65 FE) MG tablet Take 1 tablet (325 mg total) by mouth daily.   fluticasone 50 MCG/ACT nasal spray Commonly known as: FLONASE Place 2 sprays into both nostrils daily as needed for allergies or rhinitis.   gabapentin 300 MG capsule Commonly known as: NEURONTIN Take 300 mg by mouth 3 (three) times daily.   latanoprost 0.005 % ophthalmic solution Commonly known as: XALATAN Place 1 drop  into both eyes at bedtime.   multivitamin with minerals Tabs tablet Take 1 tablet by mouth daily.   nicotine 21 mg/24hr patch Commonly known as: NICODERM CQ - dosed in mg/24 hours Place 1 patch (21 mg total) onto the skin daily.   oxyCODONE 5 MG immediate release tablet Commonly known as: Oxy IR/ROXICODONE Take 1 tablet (5 mg total) by mouth every 4 (four) hours as needed.   Oxycodone HCl 10 MG Tabs Take by mouth.   pantoprazole 40 MG tablet Commonly known as: PROTONIX Take 1 tablet (40 mg total) by mouth 2 (two) times daily.   QUEtiapine 300 MG 24 hr tablet Commonly known as: SEROQUEL XR Take 150 mg by mouth 2 (two) times daily.   Spiriva Respimat 2.5 MCG/ACT Aers Generic drug: Tiotropium Bromide Monohydrate Inhale 2 puffs into the lungs daily.   sucralfate 1 g tablet Commonly known as: CARAFATE Take 1 g by mouth 4 (four) times daily.   thiamine 100 MG tablet Commonly known as: VITAMIN B1 Take 1 tablet (100 mg total) by mouth daily.   traZODone 150 MG tablet Commonly known as: DESYREL Take 150 mg by mouth at bedtime as needed.  zolpidem 10 MG tablet Commonly known as: AMBIEN Take 10 mg by mouth at bedtime.        Allergies  Allergen Reactions   Ace Inhibitors Swelling    Angioedema 10/16 requiring intubation @ OSH 2/2 pt reportedly taking someone else's Lisinopril.  Angioedema 10/16 requiring intubation @ OSH 2/2 pt reportedly taking someone else's Lisinopril.     Lisinopril Other (See Comments)    unknown    Consultations: Orthopedic surgery   Procedures/Studies: CT KNEE RIGHT WO CONTRAST  Result Date: 02/20/2022 CLINICAL DATA:  Right knee pain.  Multiple recent falls. EXAM: CT OF THE RIGHT KNEE WITHOUT CONTRAST TECHNIQUE: Multidetector CT imaging of the right knee was performed according to the standard protocol. Multiplanar CT image reconstructions were also generated. RADIATION DOSE REDUCTION: This exam was performed according to the departmental  dose-optimization program which includes automated exposure control, adjustment of the mA and/or kV according to patient size and/or use of iterative reconstruction technique. COMPARISON:  Right knee x-rays from yesterday. FINDINGS: Bones/Joint/Cartilage Acute minimally impacted fracture of the proximal tibial metadiaphysis with longitudinal intra-articular extension through the medial tibial spine. Acute mildly impacted fracture of the fibular head and neck. Partially visualized distal femoral intramedullary rod with fracture of the interlocking screw. Joint spaces are preserved. Small lipohemarthrosis. Severe osteopenia. Ligaments Ligaments are suboptimally evaluated by CT. Muscles and Tendons Grossly intact. Soft tissue No fluid collection or hematoma.  No soft tissue mass. IMPRESSION: 1. Acute minimally impacted fracture of the proximal tibial metadiaphysis with longitudinal intra-articular extension through the medial tibial spine. 2. Acute mildly impacted fracture of the fibular head and neck. 3. Small lipohemarthrosis. 4. Partially visualized distal femoral intramedullary rod with fracture of the interlocking screw. Electronically Signed   By: Obie Dredge M.D.   On: 02/20/2022 10:34   CT KNEE LEFT WO CONTRAST  Result Date: 02/20/2022 CLINICAL DATA:  Left knee pain after several falls over the last few days. EXAM: CT OF THE LEFT KNEE WITHOUT CONTRAST TECHNIQUE: Multidetector CT imaging of the left knee was performed according to the standard protocol. Multiplanar CT image reconstructions were also generated. RADIATION DOSE REDUCTION: This exam was performed according to the departmental dose-optimization program which includes automated exposure control, adjustment of the mA and/or kV according to patient size and/or use of iterative reconstruction technique. COMPARISON:  Left knee x-rays from yesterday. FINDINGS: Bones/Joint/Cartilage Acute minimally impacted transverse intra-articular fracture of the  distal femoral metaphysis, also involving the medial epicondyle. Partially visualized femoral intramedullary nail and distal interlocking screws, which do not involve the fracture. No dislocation. Joint spaces are preserved. Large lipohemarthrosis. Severe osteopenia. Ligaments Ligaments are suboptimally evaluated by CT. Muscles and Tendons Grossly intact. Soft tissue No fluid collection or hematoma.  No soft tissue mass. IMPRESSION: 1. Acute minimally impacted transverse intra-articular fracture of the distal femoral metaphysis. 2. Large lipohemarthrosis. Electronically Signed   By: Obie Dredge M.D.   On: 02/20/2022 10:28   DG Knee Complete 4 Views Left  Result Date: 02/19/2022 CLINICAL DATA:  Several falls over the last few days. EXAM: LEFT KNEE - COMPLETE 4+ VIEW COMPARISON:  None Available. FINDINGS: There is a comminuted and minimally displaced distal femur fracture. There is involvement of the medial and lateral metaphysis is well as the medial femoral condyle. No definite fracture line is seen. There is a large joint effusion, however no convincing lipohemarthrosis. No fracture of the proximal tibia or fibula. The bones are diffusely under mineralized. Intramedullary nail with distal locking screws partially visualized,  fracture does not seen to traverse the hardware. IMPRESSION: 1. Comminuted and minimally displaced distal femur fracture primarily involving the metaphysis. 2. Large joint effusion. 3. Intramedullary nail with distal locking screws partially visualized, fracture does not seen to traverse the hardware. Electronically Signed   By: Narda RutherfordMelanie  Sanford M.D.   On: 02/19/2022 18:00   DG Knee Complete 4 Views Right  Result Date: 02/19/2022 CLINICAL DATA:  Several falls over the last few days with bruising. EXAM: RIGHT KNEE - COMPLETE 4+ VIEW COMPARISON:  None Available. FINDINGS: Mildly comminuted but essentially nondisplaced proximal tibial fracture. Fracture involves the metaphysis, however  there is a lipohemarthrosis which suggests intra-articular involvement, although no intra-articular fracture line is seen on the current exam. Suspect impacted fracture of the proximal fibula. Femoral intramedullary rod with distal locking screws is partially included. There is no evidence of distal femur fracture. The bones are diffusely under mineralized. Vascular calcifications are seen. IMPRESSION: 1. Mildly comminuted but essentially nondisplaced proximal tibial fracture involving the metaphysis. There is a lipohemarthrosis which is indicative of intra-articular involvement, although intra-articular fracture line is not seen by radiograph. Recommend CT. 2. Suspect impacted fracture of the proximal fibula. Electronically Signed   By: Narda RutherfordMelanie  Sanford M.D.   On: 02/19/2022 17:58      Subjective: Seen and examined on the day of discharge.  Complains of pain otherwise stable  Discharge Exam: Vitals:   02/20/22 0349 02/20/22 0828  BP: 101/69 113/72  Pulse: (!) 125 (!) 110  Resp: 17 18  Temp: 98.8 F (37.1 C)   SpO2: 96% 96%   Vitals:   02/19/22 2230 02/20/22 0015 02/20/22 0349 02/20/22 0828  BP: 112/82 110/83 101/69 113/72  Pulse: (!) 123 (!) 118 (!) 125 (!) 110  Resp: 17 17 17 18   Temp: 98.2 F (36.8 C) 98.1 F (36.7 C) 98.8 F (37.1 C)   TempSrc: Oral Oral Oral   SpO2: 98% 99% 96% 96%  Weight:      Height:        General: Pt is alert, awake, not in acute distress Cardiovascular: RRR, S1/S2 +, no rubs, no gallops Respiratory: CTA bilaterally, no wheezing, no rhonchi Abdominal: Soft, NT, ND, bowel sounds + Extremities: no edema, no cyanosis    The results of significant diagnostics from this hospitalization (including imaging, microbiology, ancillary and laboratory) are listed below for reference.     Microbiology: No results found for this or any previous visit (from the past 240 hour(s)).   Labs: BNP (last 3 results) Recent Labs    02/19/22 0911  BNP 57.2    Basic Metabolic Panel: Recent Labs  Lab 02/19/22 0911 02/20/22 0517  NA 127* 129*  K 4.6 4.3  CL 95* 99  CO2 17* 23  GLUCOSE 76 191*  BUN <5* 8  CREATININE 0.38* 0.43*  CALCIUM 7.7* 7.4*   Liver Function Tests: Recent Labs  Lab 02/19/22 0911  AST 54*  ALT 38  ALKPHOS 103  BILITOT 0.9  PROT 5.6*  ALBUMIN 2.8*   No results for input(s): "LIPASE", "AMYLASE" in the last 168 hours. No results for input(s): "AMMONIA" in the last 168 hours. CBC: Recent Labs  Lab 02/19/22 0911 02/20/22 0517  WBC 10.8* 6.6  NEUTROABS 8.5*  --   HGB 11.7* 8.6*  HCT 33.8* 24.6*  MCV 102.1* 100.0  PLT 169 103*   Cardiac Enzymes: No results for input(s): "CKTOTAL", "CKMB", "CKMBINDEX", "TROPONINI" in the last 168 hours. BNP: Invalid input(s): "POCBNP" CBG: No results for  input(s): "GLUCAP" in the last 168 hours. D-Dimer No results for input(s): "DDIMER" in the last 72 hours. Hgb A1c No results for input(s): "HGBA1C" in the last 72 hours. Lipid Profile No results for input(s): "CHOL", "HDL", "LDLCALC", "TRIG", "CHOLHDL", "LDLDIRECT" in the last 72 hours. Thyroid function studies No results for input(s): "TSH", "T4TOTAL", "T3FREE", "THYROIDAB" in the last 72 hours.  Invalid input(s): "FREET3" Anemia work up No results for input(s): "VITAMINB12", "FOLATE", "FERRITIN", "TIBC", "IRON", "RETICCTPCT" in the last 72 hours. Urinalysis    Component Value Date/Time   COLORURINE YELLOW 10/06/2021 2004   APPEARANCEUR HAZY (A) 10/06/2021 2004   APPEARANCEUR Clear 07/13/2014 0933   LABSPEC >1.046 (H) 10/06/2021 2004   LABSPEC 1.005 07/13/2014 0933   PHURINE 7.0 10/06/2021 2004   GLUCOSEU NEGATIVE 10/06/2021 2004   GLUCOSEU Negative 07/13/2014 0933   HGBUR NEGATIVE 10/06/2021 2004   BILIRUBINUR NEGATIVE 10/06/2021 2004   BILIRUBINUR Negative 07/13/2014 0933   KETONESUR NEGATIVE 10/06/2021 2004   PROTEINUR NEGATIVE 10/06/2021 2004   UROBILINOGEN 0.2 10/01/2010 1133   NITRITE NEGATIVE  10/06/2021 2004   LEUKOCYTESUR LARGE (A) 10/06/2021 2004   LEUKOCYTESUR Trace 07/13/2014 0933   Sepsis Labs Recent Labs  Lab 02/19/22 0911 02/20/22 0517  WBC 10.8* 6.6   Microbiology No results found for this or any previous visit (from the past 240 hour(s)).   Time coordinating discharge: Over 30 minutes  SIGNED:   Tresa Moore, MD  [facility] 02/20/2022, 11:19 AM Pager   If 7PM-7AM, please contact night-coverage

## 2022-02-20 NOTE — Progress Notes (Signed)
   02/19/22 2036  Assess: MEWS Score  Temp 98.5 F (36.9 C)  BP 123/82  MAP (mmHg) 93  Pulse Rate (!) 116  Resp 20  SpO2 99 %  O2 Device Room Air  Assess: MEWS Score  MEWS Temp 0  MEWS Systolic 0  MEWS Pulse 2  MEWS RR 0  MEWS LOC 0  MEWS Score 2  MEWS Score Color Yellow  Assess: if the MEWS score is Yellow or Red  Were vital signs taken at a resting state? Yes  Focused Assessment Change from prior assessment (see assessment flowsheet)  Does the patient meet 2 or more of the SIRS criteria? Yes  Does the patient have a confirmed or suspected source of infection? Yes  Provider and Rapid Response Notified? No  MEWS guidelines implemented *See Row Information* Yes  Treat  MEWS Interventions Administered scheduled meds/treatments  Pain Scale 0-10  Pain Score 10  Pain Type Acute pain  Pain Location Leg  Pain Orientation Right;Left  Pain Descriptors / Indicators Aching;Constant;Penetrating  Pain Intervention(s) Medication (See eMAR)  Take Vital Signs  Increase Vital Sign Frequency  Yellow: Q 2hr X 2 then Q 4hr X 2, if remains yellow, continue Q 4hrs  Escalate  MEWS: Escalate Yellow: discuss with charge nurse/RN and consider discussing with provider and RRT  Notify: Charge Nurse/RN  Name of Charge Nurse/RN Notified Dorie,RN  Date Charge Nurse/RN Notified 02/19/22  Time Charge Nurse/RN Notified 2100  Assess: SIRS CRITERIA  SIRS Temperature  0  SIRS Pulse 1  SIRS Respirations  0  SIRS WBC 1  SIRS Score Sum  2

## 2022-02-21 ENCOUNTER — Inpatient Hospital Stay (HOSPITAL_COMMUNITY): Admission: RE | Admit: 2022-02-21 | Payer: Medicaid Other | Source: Home / Self Care | Admitting: Student

## 2022-02-21 ENCOUNTER — Encounter (HOSPITAL_COMMUNITY): Payer: Self-pay | Admitting: Orthopedic Surgery

## 2022-02-21 ENCOUNTER — Inpatient Hospital Stay (HOSPITAL_COMMUNITY): Payer: Medicaid Other

## 2022-02-21 ENCOUNTER — Other Ambulatory Visit: Payer: Self-pay

## 2022-02-21 ENCOUNTER — Inpatient Hospital Stay (HOSPITAL_COMMUNITY): Payer: Medicaid Other | Admitting: Certified Registered"

## 2022-02-21 ENCOUNTER — Encounter (HOSPITAL_COMMUNITY)
Admission: AD | Disposition: A | Discharge status: Skilled Nursing Facility | Payer: Self-pay | Source: Other Acute Inpatient Hospital | Attending: Internal Medicine

## 2022-02-21 ENCOUNTER — Inpatient Hospital Stay (HOSPITAL_COMMUNITY)
Admission: AD | Admit: 2022-02-21 | Discharge: 2022-03-04 | DRG: 481 | Disposition: A | Discharge status: Skilled Nursing Facility | Payer: Medicaid Other | Source: Other Acute Inpatient Hospital | Attending: Internal Medicine | Admitting: Internal Medicine

## 2022-02-21 DIAGNOSIS — F418 Other specified anxiety disorders: Secondary | ICD-10-CM | POA: Diagnosis not present

## 2022-02-21 DIAGNOSIS — F17200 Nicotine dependence, unspecified, uncomplicated: Secondary | ICD-10-CM | POA: Diagnosis not present

## 2022-02-21 DIAGNOSIS — W010XXA Fall on same level from slipping, tripping and stumbling without subsequent striking against object, initial encounter: Secondary | ICD-10-CM | POA: Diagnosis present

## 2022-02-21 DIAGNOSIS — M109 Gout, unspecified: Secondary | ICD-10-CM | POA: Diagnosis present

## 2022-02-21 DIAGNOSIS — Z751 Person awaiting admission to adequate facility elsewhere: Secondary | ICD-10-CM

## 2022-02-21 DIAGNOSIS — I739 Peripheral vascular disease, unspecified: Secondary | ICD-10-CM | POA: Diagnosis present

## 2022-02-21 DIAGNOSIS — R300 Dysuria: Secondary | ICD-10-CM | POA: Diagnosis present

## 2022-02-21 DIAGNOSIS — Z9181 History of falling: Secondary | ICD-10-CM

## 2022-02-21 DIAGNOSIS — Z888 Allergy status to other drugs, medicaments and biological substances status: Secondary | ICD-10-CM

## 2022-02-21 DIAGNOSIS — D62 Acute posthemorrhagic anemia: Secondary | ICD-10-CM | POA: Diagnosis not present

## 2022-02-21 DIAGNOSIS — F17210 Nicotine dependence, cigarettes, uncomplicated: Secondary | ICD-10-CM | POA: Diagnosis present

## 2022-02-21 DIAGNOSIS — Z8701 Personal history of pneumonia (recurrent): Secondary | ICD-10-CM

## 2022-02-21 DIAGNOSIS — F419 Anxiety disorder, unspecified: Secondary | ICD-10-CM | POA: Diagnosis present

## 2022-02-21 DIAGNOSIS — S82401A Unspecified fracture of shaft of right fibula, initial encounter for closed fracture: Secondary | ICD-10-CM | POA: Diagnosis present

## 2022-02-21 DIAGNOSIS — G4700 Insomnia, unspecified: Secondary | ICD-10-CM | POA: Diagnosis present

## 2022-02-21 DIAGNOSIS — F319 Bipolar disorder, unspecified: Secondary | ICD-10-CM | POA: Diagnosis present

## 2022-02-21 DIAGNOSIS — Z9981 Dependence on supplemental oxygen: Secondary | ICD-10-CM

## 2022-02-21 DIAGNOSIS — J9611 Chronic respiratory failure with hypoxia: Secondary | ICD-10-CM | POA: Diagnosis present

## 2022-02-21 DIAGNOSIS — F1010 Alcohol abuse, uncomplicated: Secondary | ICD-10-CM | POA: Diagnosis present

## 2022-02-21 DIAGNOSIS — H5461 Unqualified visual loss, right eye, normal vision left eye: Secondary | ICD-10-CM | POA: Diagnosis present

## 2022-02-21 DIAGNOSIS — E440 Moderate protein-calorie malnutrition: Secondary | ICD-10-CM | POA: Diagnosis present

## 2022-02-21 DIAGNOSIS — R569 Unspecified convulsions: Secondary | ICD-10-CM | POA: Diagnosis present

## 2022-02-21 DIAGNOSIS — Y92009 Unspecified place in unspecified non-institutional (private) residence as the place of occurrence of the external cause: Secondary | ICD-10-CM

## 2022-02-21 DIAGNOSIS — R519 Headache, unspecified: Secondary | ICD-10-CM | POA: Diagnosis present

## 2022-02-21 DIAGNOSIS — J449 Chronic obstructive pulmonary disease, unspecified: Secondary | ICD-10-CM

## 2022-02-21 DIAGNOSIS — M79604 Pain in right leg: Secondary | ICD-10-CM | POA: Diagnosis not present

## 2022-02-21 DIAGNOSIS — G8929 Other chronic pain: Secondary | ICD-10-CM | POA: Diagnosis present

## 2022-02-21 DIAGNOSIS — J302 Other seasonal allergic rhinitis: Secondary | ICD-10-CM | POA: Diagnosis present

## 2022-02-21 DIAGNOSIS — E559 Vitamin D deficiency, unspecified: Secondary | ICD-10-CM | POA: Diagnosis present

## 2022-02-21 DIAGNOSIS — E861 Hypovolemia: Secondary | ICD-10-CM | POA: Diagnosis present

## 2022-02-21 DIAGNOSIS — Z79899 Other long term (current) drug therapy: Secondary | ICD-10-CM

## 2022-02-21 DIAGNOSIS — D696 Thrombocytopenia, unspecified: Secondary | ICD-10-CM | POA: Diagnosis present

## 2022-02-21 DIAGNOSIS — D539 Nutritional anemia, unspecified: Secondary | ICD-10-CM | POA: Diagnosis present

## 2022-02-21 DIAGNOSIS — R262 Difficulty in walking, not elsewhere classified: Secondary | ICD-10-CM | POA: Diagnosis present

## 2022-02-21 DIAGNOSIS — Z79891 Long term (current) use of opiate analgesic: Secondary | ICD-10-CM

## 2022-02-21 DIAGNOSIS — M25562 Pain in left knee: Secondary | ICD-10-CM | POA: Diagnosis present

## 2022-02-21 DIAGNOSIS — S72452A Displaced supracondylar fracture without intracondylar extension of lower end of left femur, initial encounter for closed fracture: Secondary | ICD-10-CM | POA: Diagnosis present

## 2022-02-21 DIAGNOSIS — R9431 Abnormal electrocardiogram [ECG] [EKG]: Secondary | ICD-10-CM | POA: Diagnosis present

## 2022-02-21 DIAGNOSIS — S82101A Unspecified fracture of upper end of right tibia, initial encounter for closed fracture: Secondary | ICD-10-CM | POA: Diagnosis not present

## 2022-02-21 DIAGNOSIS — I4510 Unspecified right bundle-branch block: Secondary | ICD-10-CM | POA: Diagnosis present

## 2022-02-21 DIAGNOSIS — K219 Gastro-esophageal reflux disease without esophagitis: Secondary | ICD-10-CM | POA: Diagnosis present

## 2022-02-21 DIAGNOSIS — M549 Dorsalgia, unspecified: Secondary | ICD-10-CM | POA: Diagnosis present

## 2022-02-21 DIAGNOSIS — E871 Hypo-osmolality and hyponatremia: Secondary | ICD-10-CM | POA: Diagnosis present

## 2022-02-21 DIAGNOSIS — S72402A Unspecified fracture of lower end of left femur, initial encounter for closed fracture: Secondary | ICD-10-CM

## 2022-02-21 DIAGNOSIS — S82101K Unspecified fracture of upper end of right tibia, subsequent encounter for closed fracture with nonunion: Secondary | ICD-10-CM | POA: Diagnosis not present

## 2022-02-21 DIAGNOSIS — Z6821 Body mass index (BMI) 21.0-21.9, adult: Secondary | ICD-10-CM | POA: Diagnosis not present

## 2022-02-21 DIAGNOSIS — F1020 Alcohol dependence, uncomplicated: Secondary | ICD-10-CM | POA: Diagnosis present

## 2022-02-21 DIAGNOSIS — S82141A Displaced bicondylar fracture of right tibia, initial encounter for closed fracture: Secondary | ICD-10-CM | POA: Diagnosis present

## 2022-02-21 DIAGNOSIS — Z7951 Long term (current) use of inhaled steroids: Secondary | ICD-10-CM

## 2022-02-21 DIAGNOSIS — M1990 Unspecified osteoarthritis, unspecified site: Secondary | ICD-10-CM | POA: Diagnosis present

## 2022-02-21 DIAGNOSIS — H409 Unspecified glaucoma: Secondary | ICD-10-CM | POA: Diagnosis present

## 2022-02-21 DIAGNOSIS — Z8619 Personal history of other infectious and parasitic diseases: Secondary | ICD-10-CM

## 2022-02-21 HISTORY — PX: ORIF FEMUR FRACTURE: SHX2119

## 2022-02-21 HISTORY — PX: ORIF TIBIA PLATEAU: SHX2132

## 2022-02-21 LAB — HEPATIC FUNCTION PANEL
ALT: 28 U/L (ref 0–44)
AST: 33 U/L (ref 15–41)
Albumin: 2.3 g/dL — ABNORMAL LOW (ref 3.5–5.0)
Alkaline Phosphatase: 78 U/L (ref 38–126)
Bilirubin, Direct: 0.2 mg/dL (ref 0.0–0.2)
Indirect Bilirubin: 0.5 mg/dL (ref 0.3–0.9)
Total Bilirubin: 0.7 mg/dL (ref 0.3–1.2)
Total Protein: 4.5 g/dL — ABNORMAL LOW (ref 6.5–8.1)

## 2022-02-21 LAB — BASIC METABOLIC PANEL
Anion gap: 2 — ABNORMAL LOW (ref 5–15)
BUN: 8 mg/dL (ref 8–23)
CO2: 29 mmol/L (ref 22–32)
Calcium: 7.6 mg/dL — ABNORMAL LOW (ref 8.9–10.3)
Chloride: 102 mmol/L (ref 98–111)
Creatinine, Ser: 0.51 mg/dL — ABNORMAL LOW (ref 0.61–1.24)
GFR, Estimated: >60 mL/min (ref >60)
Glucose, Bld: 101 mg/dL — ABNORMAL HIGH (ref 70–99)
Potassium: 4.1 mmol/L (ref 3.5–5.1)
Sodium: 133 mmol/L — ABNORMAL LOW (ref 135–145)

## 2022-02-21 LAB — CBC
HCT: 24.6 % — ABNORMAL LOW (ref 39.0–52.0)
Hemoglobin: 8.3 g/dL — ABNORMAL LOW (ref 13.0–17.0)
MCH: 35.9 pg — ABNORMAL HIGH (ref 26.0–34.0)
MCHC: 33.7 g/dL (ref 30.0–36.0)
MCV: 106.5 fL — ABNORMAL HIGH (ref 80.0–100.0)
Platelets: 121 10*3/uL — ABNORMAL LOW (ref 150–400)
RBC: 2.31 MIL/uL — ABNORMAL LOW (ref 4.22–5.81)
RDW: 12.3 % (ref 11.5–15.5)
WBC: 6.5 10*3/uL (ref 4.0–10.5)
nRBC: 0 % (ref 0.0–0.2)

## 2022-02-21 SURGERY — OPEN REDUCTION INTERNAL FIXATION (ORIF) DISTAL FEMUR FRACTURE
Anesthesia: General | Site: Leg Upper | Laterality: Right

## 2022-02-21 MED ORDER — SODIUM CHLORIDE 0.9 % IV BOLUS
250.0000 mL | Freq: Once | INTRAVENOUS | Status: AC
  Administered 2022-02-21: 250 mL via INTRAVENOUS

## 2022-02-21 MED ORDER — MEPERIDINE HCL 25 MG/ML IJ SOLN: 6.2500 mg | INTRAMUSCULAR | Status: DC | PRN

## 2022-02-21 MED ORDER — THIAMINE HCL 100 MG/ML IJ SOLN
100.0000 mg | Freq: Every day | INTRAMUSCULAR | Status: DC
  Filled 2022-02-21 (×12): qty 1

## 2022-02-21 MED ORDER — FENTANYL CITRATE (PF) 100 MCG/2ML IJ SOLN
INTRAMUSCULAR | Status: DC | PRN
  Administered 2022-02-21: 100 ug via INTRAVENOUS
  Administered 2022-02-21 (×2): 50 ug via INTRAVENOUS

## 2022-02-21 MED ORDER — UMECLIDINIUM BROMIDE 62.5 MCG/ACT IN AEPB
1.0000 | INHALATION_SPRAY | Freq: Every day | RESPIRATORY_TRACT | Status: DC
  Administered 2022-02-21 – 2022-03-04 (×11): 1 via RESPIRATORY_TRACT
  Filled 2022-02-21 (×2): qty 7

## 2022-02-21 MED ORDER — METOCLOPRAMIDE HCL 5 MG/ML IJ SOLN: 5.0000 mg | Freq: Three times a day (TID) | INTRAMUSCULAR | Status: DC | PRN

## 2022-02-21 MED ORDER — ORAL CARE MOUTH RINSE: 15.0000 mL | Freq: Once | OROMUCOSAL | Status: AC

## 2022-02-21 MED ORDER — SUGAMMADEX SODIUM 200 MG/2ML IV SOLN
INTRAVENOUS | Status: DC | PRN
  Administered 2022-02-21: 200 mg via INTRAVENOUS

## 2022-02-21 MED ORDER — ONDANSETRON HCL 4 MG/2ML IJ SOLN
INTRAMUSCULAR | Status: DC | PRN
  Administered 2022-02-21: 4 mg via INTRAVENOUS

## 2022-02-21 MED ORDER — MOMETASONE FURO-FORMOTEROL FUM 200-5 MCG/ACT IN AERO
2.0000 | INHALATION_SPRAY | Freq: Two times a day (BID) | RESPIRATORY_TRACT | Status: DC
  Administered 2022-02-21 – 2022-03-04 (×24): 2 via RESPIRATORY_TRACT
  Filled 2022-02-21 (×2): qty 8.8

## 2022-02-21 MED ORDER — LORAZEPAM 2 MG/ML IJ SOLN: 0.0000 mg | Freq: Four times a day (QID) | INTRAMUSCULAR | Status: AC

## 2022-02-21 MED ORDER — LORAZEPAM 2 MG/ML IJ SOLN: 0.0000 mg | Freq: Two times a day (BID) | INTRAMUSCULAR | Status: AC

## 2022-02-21 MED ORDER — METHOCARBAMOL 500 MG PO TABS
500.0000 mg | ORAL_TABLET | Freq: Four times a day (QID) | ORAL | Status: DC | PRN
  Administered 2022-02-23 – 2022-03-04 (×14): 500 mg via ORAL
  Filled 2022-02-21 (×14): qty 1

## 2022-02-21 MED ORDER — MORPHINE SULFATE (PF) 2 MG/ML IV SOLN
2.0000 mg | INTRAVENOUS | Status: DC | PRN
  Administered 2022-02-21: 4 mg via INTRAVENOUS
  Administered 2022-02-21: 2 mg via INTRAVENOUS
  Filled 2022-02-21: qty 1
  Filled 2022-02-21: qty 2

## 2022-02-21 MED ORDER — LIDOCAINE 2% (20 MG/ML) 5 ML SYRINGE
INTRAMUSCULAR | Status: DC | PRN
  Administered 2022-02-21: 40 mg via INTRAVENOUS

## 2022-02-21 MED ORDER — VANCOMYCIN HCL 1000 MG IV SOLR
INTRAVENOUS | Status: AC
  Filled 2022-02-21: qty 20

## 2022-02-21 MED ORDER — FOLIC ACID 1 MG PO TABS
1.0000 mg | ORAL_TABLET | Freq: Every day | ORAL | Status: DC
  Administered 2022-02-22 – 2022-03-04 (×11): 1 mg via ORAL
  Filled 2022-02-21 (×11): qty 1

## 2022-02-21 MED ORDER — DEXAMETHASONE SODIUM PHOSPHATE 10 MG/ML IJ SOLN
INTRAMUSCULAR | Status: AC
  Filled 2022-02-21: qty 1

## 2022-02-21 MED ORDER — PHENYLEPHRINE HCL (PRESSORS) 10 MG/ML IV SOLN
INTRAVENOUS | Status: AC
Start: 2022-02-21 — End: ?
  Filled 2022-02-21: qty 1

## 2022-02-21 MED ORDER — ZOLPIDEM TARTRATE 5 MG PO TABS
10.0000 mg | ORAL_TABLET | Freq: Every day | ORAL | Status: DC
  Administered 2022-02-21 – 2022-03-04 (×12): 10 mg via ORAL
  Filled 2022-02-21 (×12): qty 2

## 2022-02-21 MED ORDER — PHENYLEPHRINE 80 MCG/ML (10ML) SYRINGE FOR IV PUSH (FOR BLOOD PRESSURE SUPPORT)
PREFILLED_SYRINGE | INTRAVENOUS | Status: DC | PRN
  Administered 2022-02-21 (×3): 160 ug via INTRAVENOUS
  Administered 2022-02-21: 80 ug via INTRAVENOUS

## 2022-02-21 MED ORDER — BACITRACIN ZINC 500 UNIT/GM EX OINT
TOPICAL_OINTMENT | CUTANEOUS | Status: AC
  Filled 2022-02-21: qty 28.35

## 2022-02-21 MED ORDER — CEFAZOLIN SODIUM-DEXTROSE 2-4 GM/100ML-% IV SOLN
2.0000 g | INTRAVENOUS | Status: AC
  Administered 2022-02-21: 2 g via INTRAVENOUS
  Filled 2022-02-21: qty 100

## 2022-02-21 MED ORDER — METOCLOPRAMIDE HCL 5 MG PO TABS: 5.0000 mg | ORAL_TABLET | Freq: Three times a day (TID) | ORAL | Status: DC | PRN

## 2022-02-21 MED ORDER — ACETAMINOPHEN 325 MG PO TABS
650.0000 mg | ORAL_TABLET | Freq: Four times a day (QID) | ORAL | Status: DC | PRN
Start: 2022-02-21 — End: 2022-02-21

## 2022-02-21 MED ORDER — NICOTINE 21 MG/24HR TD PT24
21.0000 mg | MEDICATED_PATCH | Freq: Every day | TRANSDERMAL | Status: DC
Start: 2022-02-21 — End: 2022-03-05
  Administered 2022-02-22 – 2022-03-04 (×11): 21 mg via TRANSDERMAL
  Filled 2022-02-21 (×11): qty 1

## 2022-02-21 MED ORDER — ALLOPURINOL 100 MG PO TABS
100.0000 mg | ORAL_TABLET | Freq: Every day | ORAL | Status: DC
  Administered 2022-02-22 – 2022-03-04 (×11): 100 mg via ORAL
  Filled 2022-02-21 (×11): qty 1

## 2022-02-21 MED ORDER — LATANOPROST 0.005 % OP SOLN
1.0000 [drp] | Freq: Every day | OPHTHALMIC | Status: DC
Start: 2022-02-21 — End: 2022-03-05
  Administered 2022-02-21 – 2022-03-04 (×12): 1 [drp] via OPHTHALMIC
  Filled 2022-02-21: qty 2.5

## 2022-02-21 MED ORDER — SODIUM CHLORIDE 0.9 % IV SOLN: INTRAVENOUS | Status: DC

## 2022-02-21 MED ORDER — PROMETHAZINE HCL 25 MG/ML IJ SOLN: 6.2500 mg | INTRAMUSCULAR | Status: DC | PRN

## 2022-02-21 MED ORDER — BISACODYL 5 MG PO TBEC: 5.0000 mg | DELAYED_RELEASE_TABLET | Freq: Every day | ORAL | Status: DC | PRN

## 2022-02-21 MED ORDER — CEFAZOLIN SODIUM-DEXTROSE 2-4 GM/100ML-% IV SOLN
2.0000 g | Freq: Three times a day (TID) | INTRAVENOUS | Status: AC
  Administered 2022-02-21 – 2022-02-22 (×3): 2 g via INTRAVENOUS
  Filled 2022-02-21 (×3): qty 100

## 2022-02-21 MED ORDER — ONDANSETRON HCL 4 MG/2ML IJ SOLN
INTRAMUSCULAR | Status: AC
  Filled 2022-02-21: qty 2

## 2022-02-21 MED ORDER — DEXAMETHASONE SODIUM PHOSPHATE 10 MG/ML IJ SOLN
INTRAMUSCULAR | Status: DC | PRN
  Administered 2022-02-21: 5 mg via INTRAVENOUS

## 2022-02-21 MED ORDER — LIDOCAINE 2% (20 MG/ML) 5 ML SYRINGE
INTRAMUSCULAR | Status: AC
  Filled 2022-02-21: qty 5

## 2022-02-21 MED ORDER — ENSURE ENLIVE PO LIQD
237.0000 mL | Freq: Two times a day (BID) | ORAL | Status: DC
Start: 2022-02-21 — End: 2022-03-05
  Administered 2022-02-21 – 2022-03-03 (×18): 237 mL via ORAL
  Filled 2022-02-21: qty 237

## 2022-02-21 MED ORDER — SODIUM CHLORIDE 0.9 % IV SOLN: INTRAVENOUS | Status: AC

## 2022-02-21 MED ORDER — CHLORHEXIDINE GLUCONATE 0.12 % MT SOLN: 15.0000 mL | Freq: Once | OROMUCOSAL | Status: AC

## 2022-02-21 MED ORDER — DOCUSATE SODIUM 100 MG PO CAPS
100.0000 mg | ORAL_CAPSULE | Freq: Two times a day (BID) | ORAL | Status: DC
  Administered 2022-02-21 – 2022-03-04 (×24): 100 mg via ORAL
  Filled 2022-02-21 (×24): qty 1

## 2022-02-21 MED ORDER — ROCURONIUM BROMIDE 10 MG/ML (PF) SYRINGE
PREFILLED_SYRINGE | INTRAVENOUS | Status: DC | PRN
  Administered 2022-02-21: 10 mg via INTRAVENOUS
  Administered 2022-02-21: 50 mg via INTRAVENOUS

## 2022-02-21 MED ORDER — PROPOFOL 10 MG/ML IV BOLUS
INTRAVENOUS | Status: AC
  Filled 2022-02-21: qty 20

## 2022-02-21 MED ORDER — GABAPENTIN 300 MG PO CAPS
300.0000 mg | ORAL_CAPSULE | Freq: Three times a day (TID) | ORAL | Status: DC
  Administered 2022-02-21 – 2022-03-04 (×35): 300 mg via ORAL
  Filled 2022-02-21 (×35): qty 1

## 2022-02-21 MED ORDER — MIDAZOLAM HCL 2 MG/2ML IJ SOLN
INTRAMUSCULAR | Status: AC
Start: 2022-02-21 — End: ?
  Filled 2022-02-21: qty 2

## 2022-02-21 MED ORDER — HYDROMORPHONE HCL 1 MG/ML IJ SOLN
INTRAMUSCULAR | Status: AC
  Filled 2022-02-21: qty 1

## 2022-02-21 MED ORDER — QUETIAPINE FUMARATE ER 50 MG PO TB24
150.0000 mg | ORAL_TABLET | Freq: Two times a day (BID) | ORAL | Status: DC
  Administered 2022-02-21 – 2022-03-04 (×23): 150 mg via ORAL
  Filled 2022-02-21 (×25): qty 3

## 2022-02-21 MED ORDER — TRANEXAMIC ACID-NACL 1000-0.7 MG/100ML-% IV SOLN
1000.0000 mg | Freq: Once | INTRAVENOUS | Status: AC
  Administered 2022-02-21: 1000 mg via INTRAVENOUS
  Filled 2022-02-21: qty 100

## 2022-02-21 MED ORDER — PHENYLEPHRINE HCL-NACL 20-0.9 MG/250ML-% IV SOLN
INTRAVENOUS | Status: DC | PRN
  Administered 2022-02-21: 30 ug/min via INTRAVENOUS

## 2022-02-21 MED ORDER — HYDROMORPHONE HCL 1 MG/ML IJ SOLN
0.2500 mg | INTRAMUSCULAR | Status: DC | PRN
  Administered 2022-02-21 (×2): 0.5 mg via INTRAVENOUS

## 2022-02-21 MED ORDER — SUCRALFATE 1 G PO TABS
1.0000 g | ORAL_TABLET | Freq: Three times a day (TID) | ORAL | Status: DC
  Administered 2022-02-21 – 2022-03-04 (×46): 1 g via ORAL
  Filled 2022-02-21 (×46): qty 1

## 2022-02-21 MED ORDER — FENTANYL CITRATE (PF) 250 MCG/5ML IJ SOLN
INTRAMUSCULAR | Status: AC
  Filled 2022-02-21: qty 5

## 2022-02-21 MED ORDER — HYDROMORPHONE HCL 1 MG/ML IJ SOLN
0.5000 mg | INTRAMUSCULAR | Status: DC | PRN
  Administered 2022-02-22: 1 mg via INTRAVENOUS
  Filled 2022-02-21: qty 1

## 2022-02-21 MED ORDER — OXYCODONE HCL 5 MG PO TABS
5.0000 mg | ORAL_TABLET | ORAL | Status: DC | PRN
  Filled 2022-02-21: qty 2

## 2022-02-21 MED ORDER — MIDAZOLAM HCL 2 MG/2ML IJ SOLN
INTRAMUSCULAR | Status: DC | PRN
  Administered 2022-02-21: 2 mg via INTRAVENOUS

## 2022-02-21 MED ORDER — ROCURONIUM BROMIDE 10 MG/ML (PF) SYRINGE
PREFILLED_SYRINGE | INTRAVENOUS | Status: AC
  Filled 2022-02-21: qty 10

## 2022-02-21 MED ORDER — VANCOMYCIN HCL 1000 MG IV SOLR
INTRAVENOUS | Status: DC | PRN
  Administered 2022-02-21: 1000 mg

## 2022-02-21 MED ORDER — PROPOFOL 10 MG/ML IV BOLUS
INTRAVENOUS | Status: DC | PRN
  Administered 2022-02-21: 100 mg via INTRAVENOUS
  Administered 2022-02-21: 30 mg via INTRAVENOUS

## 2022-02-21 MED ORDER — OXYCODONE HCL 5 MG PO TABS
10.0000 mg | ORAL_TABLET | ORAL | Status: DC | PRN
  Administered 2022-02-21: 10 mg via ORAL
  Administered 2022-02-21 – 2022-03-02 (×42): 15 mg via ORAL
  Administered 2022-03-02: 10 mg via ORAL
  Administered 2022-03-02 – 2022-03-04 (×14): 15 mg via ORAL
  Filled 2022-02-21 (×57): qty 3

## 2022-02-21 MED ORDER — CHLORHEXIDINE GLUCONATE 0.12 % MT SOLN
OROMUCOSAL | Status: AC
  Administered 2022-02-21: 15 mL via OROMUCOSAL
  Filled 2022-02-21: qty 15

## 2022-02-21 MED ORDER — POLYETHYLENE GLYCOL 3350 17 G PO PACK: 17.0000 g | PACK | Freq: Every day | ORAL | Status: DC | PRN

## 2022-02-21 MED ORDER — ACETAMINOPHEN 325 MG PO TABS: 325.0000 mg | ORAL_TABLET | Freq: Once | ORAL | Status: DC | PRN

## 2022-02-21 MED ORDER — ENOXAPARIN SODIUM 40 MG/0.4ML IJ SOSY
40.0000 mg | PREFILLED_SYRINGE | INTRAMUSCULAR | Status: DC
  Administered 2022-02-22 – 2022-03-04 (×11): 40 mg via SUBCUTANEOUS
  Filled 2022-02-21 (×11): qty 0.4

## 2022-02-21 MED ORDER — THIAMINE HCL 100 MG PO TABS
100.0000 mg | ORAL_TABLET | Freq: Every day | ORAL | Status: DC
  Administered 2022-02-22 – 2022-03-04 (×11): 100 mg via ORAL
  Filled 2022-02-21 (×11): qty 1

## 2022-02-21 MED ORDER — TRAZODONE HCL 50 MG PO TABS
150.0000 mg | ORAL_TABLET | Freq: Every evening | ORAL | Status: DC | PRN
Start: 2022-02-21 — End: 2022-03-05

## 2022-02-21 MED ORDER — AMISULPRIDE (ANTIEMETIC) 5 MG/2ML IV SOLN: 10.0000 mg | Freq: Once | INTRAVENOUS | Status: DC | PRN

## 2022-02-21 MED ORDER — PANTOPRAZOLE SODIUM 40 MG PO TBEC
40.0000 mg | DELAYED_RELEASE_TABLET | Freq: Two times a day (BID) | ORAL | Status: DC
  Administered 2022-02-21 – 2022-03-04 (×23): 40 mg via ORAL
  Filled 2022-02-21 (×23): qty 1

## 2022-02-21 MED ORDER — ACETAMINOPHEN 325 MG PO TABS
650.0000 mg | ORAL_TABLET | Freq: Four times a day (QID) | ORAL | Status: DC
  Administered 2022-02-21 – 2022-03-04 (×45): 650 mg via ORAL
  Filled 2022-02-21 (×43): qty 2

## 2022-02-21 MED ORDER — LORAZEPAM 1 MG PO TABS: 1.0000 mg | ORAL_TABLET | ORAL | Status: AC | PRN

## 2022-02-21 MED ORDER — 0.9 % SODIUM CHLORIDE (POUR BTL) OPTIME
TOPICAL | Status: DC | PRN
  Administered 2022-02-21: 1000 mL

## 2022-02-21 MED ORDER — ACETAMINOPHEN 325 MG PO TABS
325.0000 mg | ORAL_TABLET | Freq: Four times a day (QID) | ORAL | Status: DC | PRN
  Filled 2022-02-21: qty 2

## 2022-02-21 MED ORDER — CHLORHEXIDINE GLUCONATE 4 % EX LIQD
60.0000 mL | Freq: Once | CUTANEOUS | Status: AC
  Administered 2022-02-21: 4 via TOPICAL
  Filled 2022-02-21: qty 60

## 2022-02-21 MED ORDER — ACETAMINOPHEN 160 MG/5ML PO SOLN: 325.0000 mg | Freq: Once | ORAL | Status: DC | PRN

## 2022-02-21 MED ORDER — LACTATED RINGERS IV SOLN: INTRAVENOUS | Status: DC

## 2022-02-21 MED ORDER — ADULT MULTIVITAMIN W/MINERALS CH
1.0000 | ORAL_TABLET | Freq: Every day | ORAL | Status: DC
  Administered 2022-02-22 – 2022-03-04 (×11): 1 via ORAL
  Filled 2022-02-21 (×11): qty 1

## 2022-02-21 MED ORDER — ACETAMINOPHEN 10 MG/ML IV SOLN: 1000.0000 mg | Freq: Once | INTRAVENOUS | Status: DC | PRN

## 2022-02-21 MED ORDER — POVIDONE-IODINE 10 % EX SWAB
2.0000 | Freq: Once | CUTANEOUS | Status: AC
  Administered 2022-02-21: 2 via TOPICAL

## 2022-02-21 MED ORDER — METHOCARBAMOL 1000 MG/10ML IJ SOLN: 500.0000 mg | Freq: Four times a day (QID) | INTRAVENOUS | Status: DC | PRN

## 2022-02-21 MED ORDER — PHENYLEPHRINE HCL (PRESSORS) 10 MG/ML IV SOLN
INTRAVENOUS | Status: AC
  Filled 2022-02-21: qty 1

## 2022-02-21 MED ORDER — LORAZEPAM 2 MG/ML IJ SOLN: 1.0000 mg | INTRAMUSCULAR | Status: AC | PRN

## 2022-02-21 SURGICAL SUPPLY — 85 items
BAG COUNTER SPONGE SURGICOUNT (BAG) ×2 IMPLANT
BIT DRILL 4.3 (BIT) ×2
BIT DRILL 4.3X300MM (BIT) IMPLANT
BIT DRILL CALIBR QC 2.8X250 (BIT) IMPLANT
BIT DRILL LONG 3.3 (BIT) IMPLANT
BIT DRILL QC 3.3X195 (BIT) IMPLANT
BIT DRILL QC SFS 2.5X170 (BIT) IMPLANT
BLADE CLIPPER SURG (BLADE) IMPLANT
BNDG COHESIVE 6X5 TAN STRL LF (GAUZE/BANDAGES/DRESSINGS) ×2 IMPLANT
BNDG ELASTIC 4X5.8 VLCR NS LF (GAUZE/BANDAGES/DRESSINGS) IMPLANT
BNDG ELASTIC 6X10 VLCR STRL LF (GAUZE/BANDAGES/DRESSINGS) ×2 IMPLANT
BNDG ELASTIC 6X5.8 VLCR STR LF (GAUZE/BANDAGES/DRESSINGS) IMPLANT
BRUSH SCRUB EZ PLAIN DRY (MISCELLANEOUS) ×4 IMPLANT
CANISTER SUCT 3000ML PPV (MISCELLANEOUS) ×2 IMPLANT
CAP LOCK NCB (Cap) IMPLANT
CHLORAPREP W/TINT 26 (MISCELLANEOUS) ×2 IMPLANT
COVER SURGICAL LIGHT HANDLE (MISCELLANEOUS) ×2 IMPLANT
DRAPE C-ARM 42X72 X-RAY (DRAPES) ×2 IMPLANT
DRAPE C-ARMOR (DRAPES) ×2 IMPLANT
DRAPE HALF SHEET 40X57 (DRAPES) ×4 IMPLANT
DRAPE ORTHO SPLIT 77X108 STRL (DRAPES) ×4
DRAPE SURG 17X23 STRL (DRAPES) ×2 IMPLANT
DRAPE SURG ORHT 6 SPLT 77X108 (DRAPES) ×4 IMPLANT
DRAPE U-SHAPE 47X51 STRL (DRAPES) ×2 IMPLANT
DRSG ADAPTIC 3X8 NADH LF (GAUZE/BANDAGES/DRESSINGS) IMPLANT
DRSG MEPILEX BORDER 4X12 (GAUZE/BANDAGES/DRESSINGS) IMPLANT
DRSG MEPILEX BORDER 4X4 (GAUZE/BANDAGES/DRESSINGS) IMPLANT
DRSG MEPILEX BORDER 4X8 (GAUZE/BANDAGES/DRESSINGS) IMPLANT
DRSG PAD ABDOMINAL 8X10 ST (GAUZE/BANDAGES/DRESSINGS) ×6 IMPLANT
ELECT REM PT RETURN 9FT ADLT (ELECTROSURGICAL) ×2 IMPLANT
ELECTRODE REM PT RTRN 9FT ADLT (ELECTROSURGICAL) ×2 IMPLANT
GAUZE SPONGE 4X4 12PLY STRL (GAUZE/BANDAGES/DRESSINGS) ×2 IMPLANT
GLOVE BIO SURGEON STRL SZ 6.5 (GLOVE) ×6 IMPLANT
GLOVE BIO SURGEON STRL SZ7.5 (GLOVE) ×8 IMPLANT
GLOVE BIOGEL PI IND STRL 6.5 (GLOVE) ×2 IMPLANT
GLOVE BIOGEL PI IND STRL 7.5 (GLOVE) ×2 IMPLANT
GLOVE BIOGEL PI INDICATOR 6.5 (GLOVE) ×4
GLOVE BIOGEL PI INDICATOR 7.5 (GLOVE) ×4
GOWN STRL REUS W/ TWL LRG LVL3 (GOWN DISPOSABLE) ×6 IMPLANT
GOWN STRL REUS W/TWL LRG LVL3 (GOWN DISPOSABLE) ×4
K-WIRE 1.6X150 (WIRE) ×2 IMPLANT
K-WIRE 2.0 (WIRE) ×4
K-WIRE FXSTD 280X2XNS SS (WIRE) ×4 IMPLANT
KIT BASIN OR (CUSTOM PROCEDURE TRAY) ×2 IMPLANT
KIT TURNOVER KIT B (KITS) ×2 IMPLANT
KWIRE 1.6X150 (WIRE) IMPLANT
KWIRE FXSTD 280X2XNS SS (WIRE) IMPLANT
NS IRRIG 1000ML POUR BTL (IV SOLUTION) ×2 IMPLANT
PACK TOTAL JOINT (CUSTOM PROCEDURE TRAY) ×2 IMPLANT
PAD ARMBOARD 7.5X6 YLW CONV (MISCELLANEOUS) ×4 IMPLANT
PAD CAST 4YDX4 CTTN HI CHSV (CAST SUPPLIES) ×2 IMPLANT
PADDING CAST COTTON 4X4 STRL (CAST SUPPLIES) ×4
PADDING CAST COTTON 6X4 STRL (CAST SUPPLIES) ×2 IMPLANT
PLATE DIST FEM 12H (Plate) IMPLANT
PLATE LOCK VA-LCP 3.5X177 10H (Plate) IMPLANT
SCREW CORT 3.5X42M SELF TAP (Screw) IMPLANT
SCREW CORTEX 3.5X90 (Screw) IMPLANT
SCREW CORTICAL 5.0X95MM (Screw) IMPLANT
SCREW CORTICAL NCB 5.0X90MM (Screw) IMPLANT
SCREW LOCK CORT ST 3.5X32 (Screw) IMPLANT
SCREW LOCK CORT ST 3.5X36 (Screw) IMPLANT
SCREW LOCK CORT ST 3.5X38 (Screw) IMPLANT
SCREW LOCKING 3.5X80MM VA (Screw) IMPLANT
SCREW LOCKING 3.5X90MM VA (Screw) IMPLANT
SCREW LOCKING VA 3.5X85MM (Screw) IMPLANT
SCREW NCB 4.0MX38M (Screw) IMPLANT
SCREW NCB 4.0MX42M (Screw) IMPLANT
SCREW NCB 4.0X40MM (Screw) IMPLANT
SCREW NCB 5.0X85MM (Screw) IMPLANT
SPONGE T-LAP 18X18 ~~LOC~~+RFID (SPONGE) IMPLANT
STAPLER VISISTAT 35W (STAPLE) ×2 IMPLANT
SUCTION FRAZIER HANDLE 10FR (MISCELLANEOUS) ×2
SUCTION TUBE FRAZIER 10FR DISP (MISCELLANEOUS) ×2 IMPLANT
SUT ETHILON 3 0 PS 1 (SUTURE) ×4 IMPLANT
SUT VIC AB 0 CT1 27 (SUTURE) ×4
SUT VIC AB 0 CT1 27XBRD ANBCTR (SUTURE) IMPLANT
SUT VIC AB 1 CT1 18XCR BRD 8 (SUTURE) IMPLANT
SUT VIC AB 1 CT1 27 (SUTURE) ×4
SUT VIC AB 1 CT1 27XBRD ANBCTR (SUTURE) IMPLANT
SUT VIC AB 1 CT1 8-18 (SUTURE) ×2
SUT VIC AB 2-0 CT1 27 (SUTURE) ×4
SUT VIC AB 2-0 CT1 TAPERPNT 27 (SUTURE) ×4 IMPLANT
TOWEL GREEN STERILE (TOWEL DISPOSABLE) ×4 IMPLANT
TRAY FOLEY MTR SLVR 16FR STAT (SET/KITS/TRAYS/PACK) IMPLANT
WATER STERILE IRR 1000ML POUR (IV SOLUTION) ×4 IMPLANT

## 2022-02-21 NOTE — Interval H&P Note (Signed)
History and Physical Interval Note:  02/21/2022 10:09 AM  Perry Bullock  has presented today for surgery, with the diagnosis of left distal femur fracture.  The various methods of treatment have been discussed with the patient and family. After consideration of risks, benefits and other options for treatment, the patient has consented to  Procedure(s): OPEN REDUCTION INTERNAL FIXATION (ORIF) DISTAL FEMUR FRACTURE (Left) OPEN REDUCTION INTERNAL FIXATION (ORIF) RIGHT TIBIAL PLATEAU (Right) as a surgical intervention.  The patient's history has been reviewed, patient examined, no change in status, stable for surgery.  I have reviewed the patient's chart and labs.  Questions were answered to the patient's satisfaction.     Caryn Bee P Jacquel Redditt

## 2022-02-21 NOTE — Transfer of Care (Signed)
Immediate Anesthesia Transfer of Care Note  Patient: Perry Bullock  Procedure(s) Performed: OPEN REDUCTION INTERNAL FIXATION  OF LEFT DISTAL FEMUR (Left: Leg Upper) OPEN REDUCTION INTERNAL FIXATION OF RIGHT PROXIMAL TIBIAL (Right: Leg Lower)  Patient Location: PACU  Anesthesia Type:General  Level of Consciousness: drowsy and patient cooperative  Airway & Oxygen Therapy: Patient Spontanous Breathing and Patient connected to nasal cannula oxygen  Post-op Assessment: Report given to RN and Post -op Vital signs reviewed and stable  Post vital signs: Reviewed and stable  Last Vitals:  Vitals Value Taken Time  BP    Temp    Pulse 115 02/21/22 1311  Resp 17 02/21/22 1311  SpO2 100 % 02/21/22 1311  Vitals shown include unvalidated device data.  Last Pain:  Vitals:   02/21/22 0848  TempSrc: Oral  PainSc:          Complications: No notable events documented.

## 2022-02-21 NOTE — Progress Notes (Signed)
Patient seen and examined personally, I reviewed the chart, history and physical and admission note, done by admitting physician this morning and agree with the same with following addendum.  Please refer to the morning admission note for more detailed plan of care.  Briefly,  61 y.o.m w/ history significant for bipolar disorder, COPD on nocturnal oxygen,alcohol abuse, and gout who has been transferred from [facility] for evaluation of bilateral lower extremity fractures by orthopedic trauma specialist. He normally ambulates with a walker, was experiencing some pain and swelling in his foot that he attributed to a gout flare, and was having some difficulty ambulating due to this.  He reports stumbling and falling onto his knees, experiencing severe bilateral knee pain, and was unable to stand up on his own. [facility] ED and Hospital Course: Upon arrival to the ED, patient is found to be afebrile and saturating well on room air with slight tachycardia and stable blood pressure.  EKG was notable for RBBB and QTc of 500 ms. He was treated with prednisone and colchicine in the ED, was still unable to ambulate, and was admitted to the hospital.  Orthopedic surgery was consulted, plain radiographs of the bilateral knees were obtained, and bilateral fractures were noted.  These were further assessed with CT, demonstrating acute intra-articular fracture of the distal left femur with large lipohemarthrosis, as well as acute fracture of the right proximal tibia with intra-articular extension, and acute fibular head and neck fractures.Ortho Dr Emeline Gins [facility] discussed the case with orthopedic trauma specialist at [facility] Dr Carola Frost and transferred for evaluation.     Seen this morning.  Complains of pain at the left leg. Reports he lives with his girlfriend. Reports he spoke with surgeon about surgery this morning issues Distal left femur fracture  Closed fracture of right proximal tibia Status post fall: Pending  orthopedic evaluation.  Pain control DVT prophylaxis PT OT deferred to orthopedics.  COPD-not in exacerbation continue Dulera Spiriva Chronic hypoxic respiratory failure on nocturnal oxygen Macrocytic anemia monitor hemoglobin in the setting of fracture. Hyponatremia: Likely some hypovolemia getting IV fluids.  Improving.  Monitor Prolonged QT interval-minimize QT prolonging medication. Alcohol abuse: Watch for withdrawals continue CIWA Ativan, supplement/vitamin Thrombocytopenia-monitor Bipolar disorder: On Seroquel trazodone and Ambien

## 2022-02-21 NOTE — Op Note (Signed)
Orthopaedic Surgery Operative Note (CSN: [id number] ) Date of Surgery: 02/21/2022  Admit Date: 02/21/2022   Diagnoses: Pre-Op Diagnoses: Left supracondylar distal femur fracture Right proximal tibia fracture  Post-Op Diagnosis: Same  Procedures: CPT 27536-Open reduction internal fixation of right tibial plateau fracture CPT 27511-Open reduction internal fixation of left distal femur fracture CPT 20680-Removal of hardware from left femur  Surgeons : Primary: Roby Lofts, MD  Assistant: Ulyses Southward, PA-C  Location: OR 3   Anesthesia:General   Antibiotics: Ancef 2g preop with 1 gm vancomycin powder placed in femur and tibial surgical incisions   Tourniquet time:None    Estimated Blood Loss: 250 mL  Complications:None   Specimens:None  Implants: Implant Name Type Inv. Item Serial No. Manufacturer Lot No. LRB No. Used Action  SCREW CORTICAL NCB 5.0X90MM - [id number] Screw SCREW CORTICAL NCB 5.0X90MM  [employer]ORTH,TRAU,[employer],SG) [id number] Left 1 Implanted  SCREW CORTICAL NCB 5.0X90MM - [id number] Screw SCREW CORTICAL NCB 5.0X90MM  [employer](ORTH,TRAU,BIO,SG) [id number] Left 1 Implanted  SCREW CORTICAL 5.0X95MM - [id number] Screw SCREW CORTICAL 5.0X95MM  [employer](ORTH,TRAU,BIO,SG) [id number] Left 2 Implanted  PLATE DIST FEM 08X - [id number] Plate PLATE DIST FEM 97W  [employer](ORTH,TRAU,BIO,SG)  Left 1 Implanted  SCREW NCB 4.0MX38M - [id number] Screw SCREW NCB 4.0MX38M  [employer](ORTH,TRAU,BIO,SG)  Left 1 Implanted  SCREW NCB 4.0X40MM - [id number] Screw SCREW NCB 4.0X40MM  [employer](ORTH,TRAU,BIO,SG)  Left 1 Implanted  SCREW NCB 4.0MX42M - [id number] Screw SCREW NCB 4.0MX42M  [employer](ORTH,TRAU,BIO,SG)  Left 1 Implanted  SCREW NCB 5.0X85MM - [id number] Screw SCREW NCB 5.0X85MM  [employer](ORTH,TRAU,BIO,SG)  Left 1 Implanted  CAP LOCK NCB - [id number] Cap CAP LOCK NCB  [employer](ORTH,TRAU,BIO,SG)  Left 7 Implanted  SCREW LOCK CORT ST 3.5X36 - [id number]  Screw SCREW LOCK CORT ST 3.5X36  [employer]  Right 1 Implanted  SCREW LOCK CORT ST 3.5X38 - [id number] Screw SCREW LOCK CORT ST 3.5X38  [employer]  Right 1 Implanted  SCREW CORT 3.5X42M SELF TAP - [id number] Screw SCREW CORT 3.5X42M SELF TAP  [employer]  Right 1 Implanted  SCREW LOCK CORT ST 3.5X32 - [id number] Screw SCREW LOCK CORT ST 3.5X32  [employer]  Right 1 Implanted  SCREW CORTEX 3.5X90 - [id number] Screw SCREW CORTEX 3.5X90  [employer]  Right 1 Implanted  PLATE LOCK VA-LCP 3.5X177 10H - [id number] Plate PLATE LOCK VA-LCP 3.5X177 10H  [employer]  Right 1 Implanted  SCREW LOCKING 3.5X90MM VA - [id number] Screw SCREW LOCKING 3.5X90MM VA  [employer]  Right 1 Implanted  SCREW LOCKING VA 3.5X85MM - [id number] Screw SCREW LOCKING VA 3.5X85MM  [employer]  Right 2 Implanted  SCREW LOCKING 3.5X80MM VA - [id number] Screw SCREW LOCKING 3.5X80MM VA  [employer]  Right 1 Implanted     Indications for Surgery: 61 year old male who fell on his knees sustaining a left supracondylar distal femur fracture and a right proximal tibia fracture.  Due to the bilateral nature of his injuries as well as the potential for displacement I recommend proceeding with open reduction internal fixation.  Risks and benefits were discussed with the patient.  Risks included but not limited to bleeding, infection, malunion, nonunion, hardware failure, hardware irritation, nerve or blood vessel injury, DVT, even the possibility anesthetic complications.  He agreed to proceed with surgery and consent was obtained.  Operative Findings: 1.  Open reduction internal fixation of left supracondylar distal femur fracture using [employer] NCB distal femoral locking plate with removal of previous distal interlocking screws from  cephalomedullary nail device 2.  Open reduction internal fixation of right proximal tibia using Synthes 3.5 mm VA proximal tibial locking  plate  Procedure: The patient was identified in the preoperative holding area. Consent was confirmed with the patient and their family and all questions were answered. The operative extremity was marked after confirmation with the patient. he was then brought back to the operating room by our anesthesia colleagues.  He was placed under general anesthetic and carefully transferred over to radiolucent flat top table.  Bilateral lower extremities were then prepped and draped in usual sterile fashion.  A timeout was performed to verify the patient, the procedure, and the extremity.  Preoperative antibiotics were dosed.  Fluoroscopic imaging of the left lower extremity was obtained.  The hip and knee were flexed over a triangle.  A lateral approach to the distal femur was carried down through skin and subcutaneous tissue.  I incised through the IT band and encountered significant hematoma and hemarthrosis.  This was removed and I developed the plane between the vastus lateralis and the lateral cortex of the femur.  Through this incision I was able to remove the distal interlocking screws from the cephalomedullary nail device to make sure that these were not in the way of placement of the plate.  I attached a 12 hole [employer] NCB distal femoral locking plate to a targeting arm and slid it submuscularly along the lateral cortex of the femur.  Distally a K wire was used to position the plate.  Proximally a percutaneous 3.3 mm drill bit was used the drill bicortically posterior to the nail.  Then drilled and placed 5.0 millimeter screws distally to bring the plate flush to bone.  I then was able to percutaneously place 4.0 millimeter screws bicortically posterior to the nail.  A total of 3 screws were placed in the femoral shaft.  Locking caps were placed on all of the screws.  I then returned to the distal segment and placed 5.0 millimeter screws for a total of 5.  Locking caps were placed in all of the distal  screws.  Fluoroscopic imaging was obtained.  The incisions were irrigated.  A gram of vancomycin powder was placed into the incisions.  Layered closure of 0 Vicryl, 2-0 Vicryl and 3-0 nylon was used to close the skin.  I then focused on the right lower extremity.  Fluoroscopic imaging showed the fracture that was in situ.  A small lateral approach to the proximal tibia was carried down through skin and subcutaneous tissue.  I incised through the IT band and released this off the lateral condyle of the proximal tibia.  I then developed the interval between the anterior musculature and the lateral cortex of the tibia.  I slid a Synthes 3.5 mm VA proximal tibial locking plate submuscularly along the lateral cortex of the tibia.  I held the proximal portion of the plate and held with a K wire.  I confirmed positioning with fluoroscopy.  I then placed a nonlocking screw proximally to bring the plate flush to bone.  I then percutaneously placed four 3.5 millimeter screws into the tibial shaft.  I then returned to the proximal segment and placed 3.5 mm locking screws to complete the construct.  Final fluoroscopic imaging was obtained.  The incisions were copiously irrigated.  A gram of vancomycin powder was placed into the incisions.  A layered closure of 0 Vicryl, 2-0 Vicryl and 3-0 nylon was used to close the skin.  Sterile  dressings were applied to both lower extremities.  The patient was then awoken from anesthesia and taken to the PACU in stable condition.  Post Op Plan/Instructions: The patient may be weightbearing as tolerated on the left lower extremity weightbearing for transfers on the right lower extremity.  He will receive postoperative Ancef.  He will receive Lovenox for DVT prophylaxis and discharged on a DOAC.  We will have him mobilize with physical and Occupational Therapy.  I was present and performed the entire surgery.  Ulyses Southward, PA-C did assist me throughout the case. An assistant was  necessary given the difficulty in approach, maintenance of reduction and ability to instrument the fracture.   Truitt Merle, MD [facility]

## 2022-02-21 NOTE — H&P (Signed)
History and Physical    Perry Bullock SLH:[id number] DOB: 1961-03-19 DOA: 02/21/2022  PCP: [facility]   Patient coming from: Home   Chief Complaint: Severe b/l knee pain   HPI: Perry Bullock is a pleasant 61 y.o. male with medical history significant for bipolar disorder, COPD, nocturnal oxygen requirement, alcohol abuse, and gout who has been transferred from [facility] for evaluation of bilateral lower extremity fractures by orthopedic trauma specialist.  Patient reports that he normally ambulates with a walker, was experiencing some pain and swelling in his foot that he attributed to a gout flare, and was having some difficulty ambulating due to this.  He reports stumbling and falling onto his knees, experiencing severe bilateral knee pain, and was unable to stand up on his own.  EMS was called, helped the patient up from the floor, and brought him into the ED at [facility] for evaluation.  [facility] ED and Hospital Course: Upon arrival to the ED, patient is found to be afebrile and saturating well on room air with slight tachycardia and stable blood pressure.  EKG was notable for RBBB and QTc of 500 ms.  He was treated with prednisone and colchicine in the ED, was still unable to ambulate, and was admitted to the hospital.  Orthopedic surgery was consulted, plain radiographs of the bilateral knees were obtained, and bilateral fractures were noted.  These were further assessed with CT, demonstrating acute intra-articular fracture of the distal left femur with large lipohemarthrosis, as well as acute fracture of the right proximal tibia with intra-articular extension, and acute fibular head and neck fractures.  Orthopedic surgery at [facility] discussed the case with orthopedic trauma specialist at [facility] who agreed with transfer to [facility].  Review of Systems:  All other systems reviewed and apart from HPI, are negative.  Past Medical History:  Diagnosis Date   Anxiety    Arthritis     Asthma    Bipolar 1 disorder (HCC)    Blind right eye    Broken hip (HCC)    Chronic back pain    Diverticulosis   Chronic headaches    Convulsion (HCC)    once a month when stands up too quickly   COPD (chronic obstructive pulmonary disease) (HCC)    DDD (degenerative disc disease), lumbar    Diverticulitis    GERD (gastroesophageal reflux disease)    Glaucoma    Hepatitis    hepatitis C   Pneumonia    PVD (peripheral vascular disease) (HCC)    lower extremities reddened and feet swollen   Seasonal allergies    Shortness of breath dyspnea     Past Surgical History:  Procedure Laterality Date   BIOPSY  10/09/2021   Procedure: BIOPSY;  Surgeon: Jenel Lucks, MD;  Location: [facility] ENDOSCOPY;  Service: Gastroenterology;;   ESOPHAGOGASTRODUODENOSCOPY (EGD) WITH PROPOFOL N/A 10/09/2021   Procedure: ESOPHAGOGASTRODUODENOSCOPY (EGD) WITH PROPOFOL;  Surgeon: Jenel Lucks, MD;  Location: [facility] ENDOSCOPY;  Service: Gastroenterology;  Laterality: N/A;   EYE SURGERY Right    HIP SURGERY Left    INTRAMEDULLARY (IM) NAIL INTERTROCHANTERIC Right 01/16/2017   Procedure: INTRAMEDULLARY (IM) NAIL INTERTROCHANTRIC;  Surgeon: Christena Flake, MD;  Location: [facility] ORS;  Service: Orthopedics;  Laterality: Right;   INTRAMEDULLARY (IM) NAIL INTERTROCHANTERIC Left 03/09/2020   Procedure: INTRAMEDULLARY (IM) NAIL INTERTROCHANTRIC;  Surgeon: Juanell Fairly, MD;  Location: [facility] ORS;  Service: Orthopedics;  Laterality: Left;   INTUBATION-ENDOTRACHEAL WITH TRACHEOSTOMY STANDBY N/A 04/22/2015   Procedure:  INTUBATION-ENDOTRACHEAL WITH TRACHEOSTOMY STANDBY;  Surgeon: Linus Salmons, MD;  Location: [facility] ORS;  Service: ENT;  Laterality: N/A;   JOINT REPLACEMENT Left    Lt shoulder   MANDIBLE SURGERY      Social History:   reports that he has been smoking cigarettes. He has never used smokeless tobacco. He reports current alcohol use of about 3.0 standard drinks of alcohol per week. He reports that he  does not use drugs.  Allergies  Allergen Reactions   Ace Inhibitors Swelling    Angioedema 10/16 requiring intubation @ OSH 2/2 pt reportedly taking someone else's Lisinopril.  Angioedema 10/16 requiring intubation @ OSH 2/2 pt reportedly taking someone else's Lisinopril.     Lisinopril Other (See Comments)    unknown    Family History  Problem Relation Age of Onset   Breast cancer Mother    Hypertension Father    Diabetes Father      Prior to Admission medications   Medication Sig Start Date End Date Taking? Authorizing Provider  Multiple Vitamin (MULTIVITAMIN WITH MINERALS) TABS tablet Take 1 tablet by mouth daily. 03/15/20  Yes Lynn Ito, MD  pantoprazole (PROTONIX) 40 MG tablet Take 1 tablet (40 mg total) by mouth 2 (two) times daily. 10/11/21 02/21/22 Yes Adhikari, Willia Craze, MD  ADVAIR DISKUS 500-50 MCG/ACT AEPB Inhale 1 puff into the lungs in the morning and at bedtime. 07/27/21   [provider]  albuterol (PROVENTIL) (2.5 MG/3ML) 0.083% nebulizer solution Take 3 mLs (2.5 mg total) by nebulization every 6 (six) hours as needed for wheezing or shortness of breath. 10/23/21   Sallyanne Kuster, NP  albuterol (VENTOLIN HFA) 108 (90 Base) MCG/ACT inhaler Inhale 2 puffs into the lungs every 6 (six) hours as needed for wheezing or shortness of breath. 10/23/21   Sallyanne Kuster, NP  allopurinol (ZYLOPRIM) 100 MG tablet Take 100 mg by mouth daily. 02/07/22   [provider]  bisacodyl (DULCOLAX) 5 MG EC tablet Take 1 tablet (5 mg total) by mouth daily as needed for moderate constipation. Patient taking differently: Take 5 mg by mouth daily. 10/04/21   Arnetha Courser, MD  cetirizine (ZYRTEC) 10 MG tablet Take 10 mg by mouth daily.    [provider]  diclofenac Sodium (VOLTAREN) 1 % GEL Apply 1 application. topically daily as needed (pain).    [provider]  feeding supplement (ENSURE ENLIVE / ENSURE PLUS) LIQD Take 237 mLs by mouth 2 (two) times daily  between meals. 10/04/21   Arnetha Courser, MD  ferrous fumarate-b12-vitamic C-folic acid (TRINSICON / FOLTRIN) capsule Take 1 capsule by mouth 2 (two) times daily after a meal. 10/04/21   Arnetha Courser, MD  ferrous sulfate 325 (65 FE) MG tablet Take 1 tablet (325 mg total) by mouth daily. 10/11/21 10/11/22  Burnadette Pop, MD  fluticasone (FLONASE) 50 MCG/ACT nasal spray Place 2 sprays into both nostrils daily as needed for allergies or rhinitis.    [provider]  gabapentin (NEURONTIN) 300 MG capsule Take 300 mg by mouth 3 (three) times daily. 08/26/21   [provider]  latanoprost (XALATAN) 0.005 % ophthalmic solution Place 1 drop into both eyes at bedtime. 08/31/21   [provider]  mometasone-formoterol (DULERA) 100-5 MCG/ACT AERO Inhale 2 puffs by mouth twice daily 02/18/22   Sallyanne Kuster, NP  nicotine (NICODERM CQ - DOSED IN MG/24 HOURS) 21 mg/24hr patch Place 1 patch (21 mg total) onto the skin daily. 10/23/21   Sallyanne Kuster, NP  oxyCODONE (OXY IR/ROXICODONE)  5 MG immediate release tablet Take 1 tablet (5 mg total) by mouth every 4 (four) hours as needed. 10/04/21   Arnetha Courser, MD  Oxycodone HCl 10 MG TABS Take by mouth. 01/21/22   [provider]  QUEtiapine (SEROQUEL XR) 300 MG 24 hr tablet Take 150 mg by mouth 2 (two) times daily. 07/29/21   [provider]  sucralfate (CARAFATE) 1 g tablet Take 1 g by mouth 4 (four) times daily. 10/29/21   [provider]  thiamine 100 MG tablet Take 1 tablet (100 mg total) by mouth daily. 03/15/20   Lynn Ito, MD  Tiotropium Bromide Monohydrate (SPIRIVA RESPIMAT) 2.5 MCG/ACT AERS Inhale 2 puffs into the lungs daily. 10/23/21   Sallyanne Kuster, NP  traZODone (DESYREL) 150 MG tablet Take 150 mg by mouth at bedtime as needed. 10/25/21   [provider]  zolpidem (AMBIEN) 10 MG tablet Take 10 mg by mouth at bedtime. 02/01/22   [provider]    Physical Exam: Vitals:   02/21/22 0209  02/21/22 0230  BP: 129/80   Pulse: (!) 108   Resp: 20   Temp: 98.5 F (36.9 C)   TempSrc: Oral   SpO2: 98%   Weight:  71.7 kg  Height:  5\' 11"  (1.803 m)    Constitutional: NAD, calm  Eyes: lids normal. Left eye appears grossly normal.  ENMT: Mucous membranes are moist. Posterior pharynx clear of any exudate or lesions.   Neck: supple, no masses  Respiratory: No rhonchi. Speaking full sentences. No accessory muscle use.  Cardiovascular: S1 & S2 heard, regular rate and rhythm. No significant JVD. Abdomen: No distension, no tenderness, soft. Bowel sounds active.  Musculoskeletal: no clubbing / cyanosis. B/l knee tenderness, ecchymosis and edema on left, neurovascularly intact b/l.   Skin: no significant rashes, lesions, ulcers. Warm, dry, well-perfused. Neurologic: No gross facial asymmetry. Sensation intact. Moving all extremities. Alert and oriented.  Psychiatric: Pleasant. Cooperative.    Labs and Imaging on Admission: I have personally reviewed following labs and imaging studies  CBC: Recent Labs  Lab 02/19/22 0911 02/20/22 0517  WBC 10.8* 6.6  NEUTROABS 8.5*  --   HGB 11.7* 8.6*  HCT 33.8* 24.6*  MCV 102.1* 100.0  PLT 169 103*   Basic Metabolic Panel: Recent Labs  Lab 02/19/22 0911 02/20/22 0517  NA 127* 129*  K 4.6 4.3  CL 95* 99  CO2 17* 23  GLUCOSE 76 191*  BUN <5* 8  CREATININE 0.38* 0.43*  CALCIUM 7.7* 7.4*   GFR: Estimated Creatinine Clearance: 98.3 mL/min (A) (by C-G formula based on SCr of 0.43 mg/dL (L)). Liver Function Tests: Recent Labs  Lab 02/19/22 0911  AST 54*  ALT 38  ALKPHOS 103  BILITOT 0.9  PROT 5.6*  ALBUMIN 2.8*   No results for input(s): "LIPASE", "AMYLASE" in the last 168 hours. No results for input(s): "AMMONIA" in the last 168 hours. Coagulation Profile: No results for input(s): "INR", "PROTIME" in the last 168 hours. Cardiac Enzymes: No results for input(s): "CKTOTAL", "CKMB", "CKMBINDEX", "TROPONINI" in the last 168  hours. BNP (last 3 results) No results for input(s): "PROBNP" in the last 8760 hours. HbA1C: No results for input(s): "HGBA1C" in the last 72 hours. CBG: No results for input(s): "GLUCAP" in the last 168 hours. Lipid Profile: No results for input(s): "CHOL", "HDL", "LDLCALC", "TRIG", "CHOLHDL", "LDLDIRECT" in the last 72 hours. Thyroid Function Tests: No results for input(s): "TSH", "T4TOTAL", "FREET4", "T3FREE", "THYROIDAB" in the last 72 hours. Anemia  Panel: No results for input(s): "VITAMINB12", "FOLATE", "FERRITIN", "TIBC", "IRON", "RETICCTPCT" in the last 72 hours. Urine analysis:    Component Value Date/Time   COLORURINE YELLOW 10/06/2021 2004   APPEARANCEUR HAZY (A) 10/06/2021 2004   APPEARANCEUR Clear 07/13/2014 0933   LABSPEC >1.046 (H) 10/06/2021 2004   LABSPEC 1.005 07/13/2014 0933   PHURINE 7.0 10/06/2021 2004   GLUCOSEU NEGATIVE 10/06/2021 2004   GLUCOSEU Negative 07/13/2014 0933   HGBUR NEGATIVE 10/06/2021 2004   BILIRUBINUR NEGATIVE 10/06/2021 2004   BILIRUBINUR Negative 07/13/2014 0933   KETONESUR NEGATIVE 10/06/2021 2004   PROTEINUR NEGATIVE 10/06/2021 2004   UROBILINOGEN 0.2 10/01/2010 1133   NITRITE NEGATIVE 10/06/2021 2004   LEUKOCYTESUR LARGE (A) 10/06/2021 2004   LEUKOCYTESUR Trace 07/13/2014 0933   Sepsis Labs: (procalcitonin:4,lacticidven:4) )No results found for this or any previous visit (from the past 240 hour(s)).   Radiological Exams on Admission: CT KNEE RIGHT WO CONTRAST  Result Date: 02/20/2022 CLINICAL DATA:  Right knee pain.  Multiple recent falls. EXAM: CT OF THE RIGHT KNEE WITHOUT CONTRAST TECHNIQUE: Multidetector CT imaging of the right knee was performed according to the standard protocol. Multiplanar CT image reconstructions were also generated. RADIATION DOSE REDUCTION: This exam was performed according to the departmental dose-optimization program which includes automated exposure control, adjustment of the mA and/or kV  according to patient size and/or use of iterative reconstruction technique. COMPARISON:  Right knee x-rays from yesterday. FINDINGS: Bones/Joint/Cartilage Acute minimally impacted fracture of the proximal tibial metadiaphysis with longitudinal intra-articular extension through the medial tibial spine. Acute mildly impacted fracture of the fibular head and neck. Partially visualized distal femoral intramedullary rod with fracture of the interlocking screw. Joint spaces are preserved. Small lipohemarthrosis. Severe osteopenia. Ligaments Ligaments are suboptimally evaluated by CT. Muscles and Tendons Grossly intact. Soft tissue No fluid collection or hematoma.  No soft tissue mass. IMPRESSION: 1. Acute minimally impacted fracture of the proximal tibial metadiaphysis with longitudinal intra-articular extension through the medial tibial spine. 2. Acute mildly impacted fracture of the fibular head and neck. 3. Small lipohemarthrosis. 4. Partially visualized distal femoral intramedullary rod with fracture of the interlocking screw. Electronically Signed   By: Obie Dredge M.D.   On: 02/20/2022 10:34   CT KNEE LEFT WO CONTRAST  Result Date: 02/20/2022 CLINICAL DATA:  Left knee pain after several falls over the last few days. EXAM: CT OF THE LEFT KNEE WITHOUT CONTRAST TECHNIQUE: Multidetector CT imaging of the left knee was performed according to the standard protocol. Multiplanar CT image reconstructions were also generated. RADIATION DOSE REDUCTION: This exam was performed according to the departmental dose-optimization program which includes automated exposure control, adjustment of the mA and/or kV according to patient size and/or use of iterative reconstruction technique. COMPARISON:  Left knee x-rays from yesterday. FINDINGS: Bones/Joint/Cartilage Acute minimally impacted transverse intra-articular fracture of the distal femoral metaphysis, also involving the medial epicondyle. Partially visualized femoral  intramedullary nail and distal interlocking screws, which do not involve the fracture. No dislocation. Joint spaces are preserved. Large lipohemarthrosis. Severe osteopenia. Ligaments Ligaments are suboptimally evaluated by CT. Muscles and Tendons Grossly intact. Soft tissue No fluid collection or hematoma.  No soft tissue mass. IMPRESSION: 1. Acute minimally impacted transverse intra-articular fracture of the distal femoral metaphysis. 2. Large lipohemarthrosis. Electronically Signed   By: Obie Dredge M.D.   On: 02/20/2022 10:28   DG Knee Complete 4 Views Left  Result Date: 02/19/2022 CLINICAL DATA:  Several falls over the last few days. EXAM: LEFT KNEE -  COMPLETE 4+ VIEW COMPARISON:  None Available. FINDINGS: There is a comminuted and minimally displaced distal femur fracture. There is involvement of the medial and lateral metaphysis is well as the medial femoral condyle. No definite fracture line is seen. There is a large joint effusion, however no convincing lipohemarthrosis. No fracture of the proximal tibia or fibula. The bones are diffusely under mineralized. Intramedullary nail with distal locking screws partially visualized, fracture does not seen to traverse the hardware. IMPRESSION: 1. Comminuted and minimally displaced distal femur fracture primarily involving the metaphysis. 2. Large joint effusion. 3. Intramedullary nail with distal locking screws partially visualized, fracture does not seen to traverse the hardware. Electronically Signed   By: Narda RutherfordMelanie  Sanford M.D.   On: 02/19/2022 18:00   DG Knee Complete 4 Views Right  Result Date: 02/19/2022 CLINICAL DATA:  Several falls over the last few days with bruising. EXAM: RIGHT KNEE - COMPLETE 4+ VIEW COMPARISON:  None Available. FINDINGS: Mildly comminuted but essentially nondisplaced proximal tibial fracture. Fracture involves the metaphysis, however there is a lipohemarthrosis which suggests intra-articular involvement, although no  intra-articular fracture line is seen on the current exam. Suspect impacted fracture of the proximal fibula. Femoral intramedullary rod with distal locking screws is partially included. There is no evidence of distal femur fracture. The bones are diffusely under mineralized. Vascular calcifications are seen. IMPRESSION: 1. Mildly comminuted but essentially nondisplaced proximal tibial fracture involving the metaphysis. There is a lipohemarthrosis which is indicative of intra-articular involvement, although intra-articular fracture line is not seen by radiograph. Recommend CT. 2. Suspect impacted fracture of the proximal fibula. Electronically Signed   By: Narda RutherfordMelanie  Sanford M.D.   On: 02/19/2022 17:58    EKG: Independently reviewed. SR, RBBB, QTc 500.   Assessment/Plan   1. Distal left femur fracture; right proximal tibia and fibula fractures  - [facility] with severe b/l knee pain after a fall at home and was found to have distal left femur fx and proximal right tibia and fibula fractures  - [facility] by orthopedic trauma specialist  - Based on the available data, Mr. Kerry DoryChriscoe presents an estimated 0.6% risk of perioperative MI or cardiac arrest  - Continue pain-control and supportive care   2. COPD; chronic hypoxic respiratory failure  - No cough or wheezing on admission  - Continue Dulera, Spiriva, and nocturnal oxygen    3. Macrocytic anemia; thrombocytopenia  - Hgb is 8.6 on admission without overt bleeding; platelets 103,000 without apparent infection  - Platelets intermittently low, likely related to alcoholism and possible liver disease  - Type and screen, monitor counts    4. Hyponatremia  - Mild hyponatremia in setting of hypovolemia  - Hydrate with NS and repeat chem panel     5. Alcohol abuse  - No signs of withdrawal on admission  - Monitor with CIWA, use Ativan as needed, supplement vitamins   6. Depression, anxiety, insomnia   - Continue Seroquel, trazodone, Ambien   7. Prolonged QT interval (resolved)  - [facility]  - [facility] and QTc now 470 ms    DVT prophylaxis: SCD  Code Status: Full  Level of Care: Level of care: Med-Surg Family Communication: None present  Disposition Plan:  Patient is from: home  Anticipated d/c is to: TBD Anticipated d/c date is: 02/24/22  Patient currently: Pending orthopedic surgery, likely surgical management of fracture Consults called: Orthopedic surgery  Admission  status: Inpatient     Perry Deutscher, MD [facility]  02/21/2022, 5:07 AM

## 2022-02-21 NOTE — Anesthesia Procedure Notes (Signed)
Procedure Name: Intubation Date/Time: 02/21/2022 11:12 AM  Performed by: Moshe Salisbury, CRNAPre-anesthesia Checklist: Patient identified, Emergency Drugs available, Suction available and Patient being monitored Patient Re-evaluated:Patient Re-evaluated prior to induction Oxygen Delivery Method: Circle System Utilized Preoxygenation: Pre-oxygenation with 100% oxygen Induction Type: IV induction Ventilation: Mask ventilation without difficulty Laryngoscope Size: Mac and 4 Grade View: Grade I Tube type: Oral Tube size: 8.0 mm Number of attempts: 1 Airway Equipment and Method: Stylet Placement Confirmation: ETT inserted through vocal cords under direct vision, positive ETCO2 and breath sounds checked- equal and bilateral Secured at: 22 cm Tube secured with: Tape Dental Injury: Teeth and Oropharynx as per pre-operative assessment

## 2022-02-21 NOTE — Progress Notes (Signed)
Initial Nutrition Assessment  DOCUMENTATION CODES:   Not applicable  INTERVENTION:   -Once diet is advanced, add:   -Ensure Enlive po BID, each supplement provides 350 kcal and 20 grams of protein -MVI with minerals daily  NUTRITION DIAGNOSIS:   Increased nutrient needs related to post-op healing as evidenced by estimated needs.  GOAL:   Patient will meet greater than or equal to 90% of their needs  MONITOR:   PO intake, Supplement acceptance  REASON FOR ASSESSMENT:   Consult Assessment of nutrition requirement/status, Hip fracture protocol  ASSESSMENT:   Pt with medical history significant for bipolar disorder, COPD, nocturnal oxygen requirement, alcohol abuse, and gout who has been admitted for evaluation of bilateral lower extremity fractures by orthopedic trauma specialist.  Pt admitted with distal lt femur fractures and rt proximal tibia and fibula fractures.   Reviewed I/O's: +1.5 ml x 24 hours   Pt unavailable at time of visit. Pt down in OR at time of visit. RD unable to obtain further nutrition-related history or complete nutrition-focused physical exam at this time.    Per orthopedics notes, plan for ORIF of lt femur and ORIF of rt tibia today. Pt currently NPO for procedure.   Pt familiar to this RD due to prior admissions. His UBW is around 140-150#. Reviewed wt hx; wt has been stable over the past 4 months.   Pt with increased nutritional needs for post-operative healing and would benefit from addition of oral nutrition supplements.   Medications reviewed and include ativan, vitamin B-1, and 0.9% sodium chloride infusion @ 75 ml/hr, and lactated ringers infusion @ 10 ml/hr.   Labs reviewed: Na: 133, CBGS: 110.   Diet Order:   Diet Order             Diet NPO time specified Except for: Ice Chips, Sips with Meds  Diet effective now                   EDUCATION NEEDS:   No education needs have been identified at this time  Skin:  Skin  Assessment: Skin Integrity Issues: Skin Integrity Issues:: Other (Comment), Incisions Incisions: closed lt thigh, closed rt thigh Other: skin tear to lt lower arm  Last BM:  Unknown  Height:   Ht Readings from Last 1 Encounters:  02/21/22 5\' 11"  (1.803 m)    Weight:   Wt Readings from Last 1 Encounters:  02/21/22 71.2 kg    Ideal Body Weight:  72 kg  BMI:  Body mass index is 21.9 kg/m.  Estimated Nutritional Needs:   Kcal:  2150-2350  Protein:  105-120 grams  Fluid:  > 2 L    06-07-1990, RD, LDN, CDCES Registered Dietitian II Certified Diabetes Care and Education Specialist Please refer to [facility] for RD and/or RD on-call/weekend/after hours pager

## 2022-02-21 NOTE — Hospital Course (Addendum)
61 y.o.m w/ history significant for bipolar disorder, COPD on nocturnal oxygen,alcohol abuse, and gout who has been transferred from [facility] for evaluation of bilateral lower extremity fractures by orthopedic trauma specialist. He normally ambulates with a walker, was experiencing some pain and swelling in his foot that he attributed to a gout flare, and was having some difficulty ambulating due to this.  He reports stumbling and falling onto his knees, experiencing severe bilateral knee pain, and was unable to stand up on his own. [facility] ED and Hospital Course: Upon arrival to the ED, patient is found to be afebrile and saturating well on room air with slight tachycardia and stable blood pressure.  EKG was notable for RBBB and QTc of 500 ms. He was treated with prednisone and colchicine in the ED, was still unable to ambulate, and was admitted to the hospital.  Orthopedic surgery was consulted, plain radiographs of the bilateral knees were obtained, and bilateral fractures were noted.  These were further assessed with CT, demonstrating acute intra-articular fracture of the distal left femur with large lipohemarthrosis, as well as acute fracture of the right proximal tibia with intra-articular extension, and acute fibular head and neck fractures.Ortho Dr Emeline Gins [facility] discussed the case with orthopedic trauma specialist at [facility] Dr Carola Frost and transferred for evaluation. Patient underwent ORIF of right tibial plateau fracture, left distal femur fracture and removal of hardware from left femur by Dr Jena Gauss 02/21/22.  Hemoglobin dropped due to blood loss anemia needing 1 unit PRBC transfusion 8/27.  PT OT recommending CIR

## 2022-02-21 NOTE — Consult Note (Signed)
Orthopaedic Trauma Service (OTS) Consult   Patient ID: Perry Bullock MRN: [id number] DOB/AGE: June 24, 1961 61 y.o.  Reason for Consult:Left distal femur and right proximal tibia fracture Referring Physician: Dr. Ross Marcus, MD [facility]  HPI: Perry Bullock is an 61 y.o. male who is being seen in consultation at the request of Dr. Okey Dupre for evaluation of bilateral lower extremity injuries.  The patient usually ambulates with the use of rolling walker.  He is on home oxygen due to COPD.  He fell on bilateral lower extremities had severe pain in his left knee and also pain and swelling in his right knee.  Bilateral CT scans of his knees show a nondisplaced distal femur fracture below his previous hip nail.  He also had a proximal extra-articular tibial fracture.  Due to the complexity of the injuries Dr. Okey Dupre felt that this was outside the scope of practice and required treatment by an orthopedic traumatologist.  He was transferred to [facility] last night.  Currently comfortable.  States that his pain is manageable if he does not move it.  Otherwise he cannot bear the pain.  Has not been able to mobilize at all.  He is on home oxygen.  He ambulates with a walker at baseline.  He has a history of gout.  Past Medical History:  Diagnosis Date   Anxiety    Arthritis    Asthma    Bipolar 1 disorder (HCC)    Blind right eye    Broken hip (HCC)    Chronic back pain    Diverticulosis   Chronic headaches    Convulsion (HCC)    once a month when stands up too quickly   COPD (chronic obstructive pulmonary disease) (HCC)    DDD (degenerative disc disease), lumbar    Diverticulitis    GERD (gastroesophageal reflux disease)    Glaucoma    Hepatitis    hepatitis C   Pneumonia    PVD (peripheral vascular disease) (HCC)    lower extremities reddened and feet swollen   Seasonal allergies    Shortness of breath dyspnea     Past Surgical History:  Procedure Laterality Date    BIOPSY  10/09/2021   Procedure: BIOPSY;  Surgeon: Jenel Lucks, MD;  Location: [facility] ENDOSCOPY;  Service: Gastroenterology;;   ESOPHAGOGASTRODUODENOSCOPY (EGD) WITH PROPOFOL N/A 10/09/2021   Procedure: ESOPHAGOGASTRODUODENOSCOPY (EGD) WITH PROPOFOL;  Surgeon: Jenel Lucks, MD;  Location: [facility] ENDOSCOPY;  Service: Gastroenterology;  Laterality: N/A;   EYE SURGERY Right    HIP SURGERY Left    INTRAMEDULLARY (IM) NAIL INTERTROCHANTERIC Right 01/16/2017   Procedure: INTRAMEDULLARY (IM) NAIL INTERTROCHANTRIC;  Surgeon: Christena Flake, MD;  Location: [facility] ORS;  Service: Orthopedics;  Laterality: Right;   INTRAMEDULLARY (IM) NAIL INTERTROCHANTERIC Left 03/09/2020   Procedure: INTRAMEDULLARY (IM) NAIL INTERTROCHANTRIC;  Surgeon: Juanell Fairly, MD;  Location: [facility] ORS;  Service: Orthopedics;  Laterality: Left;   INTUBATION-ENDOTRACHEAL WITH TRACHEOSTOMY STANDBY N/A 04/22/2015   Procedure: INTUBATION-ENDOTRACHEAL WITH TRACHEOSTOMY STANDBY;  Surgeon: Linus Salmons, MD;  Location: [facility] ORS;  Service: ENT;  Laterality: N/A;   JOINT REPLACEMENT Left    Lt shoulder   MANDIBLE SURGERY      Family History  Problem Relation Age of Onset   Breast cancer Mother    Hypertension Father    Diabetes Father     Social History:  reports that he has been smoking cigarettes. He has never used smokeless tobacco. He reports current alcohol use of about 3.0  standard drinks of alcohol per week. He reports that he does not use drugs.  Allergies:  Allergies  Allergen Reactions   Ace Inhibitors Swelling    Angioedema 10/16 requiring intubation @ OSH 2/2 pt reportedly taking someone else's Lisinopril.  Angioedema 10/16 requiring intubation @ OSH 2/2 pt reportedly taking someone else's Lisinopril.     Lisinopril Other (See Comments)    unknown    Medications:  No current facility-administered medications on file prior to encounter.   Current Outpatient Medications on File Prior to Encounter  Medication  Sig Dispense Refill   Multiple Vitamin (MULTIVITAMIN WITH MINERALS) TABS tablet Take 1 tablet by mouth daily. 30 tablet 0   pantoprazole (PROTONIX) 40 MG tablet Take 1 tablet (40 mg total) by mouth 2 (two) times daily. 112 tablet 0   ADVAIR DISKUS 500-50 MCG/ACT AEPB Inhale 1 puff into the lungs in the morning and at bedtime.     albuterol (PROVENTIL) (2.5 MG/3ML) 0.083% nebulizer solution Take 3 mLs (2.5 mg total) by nebulization every 6 (six) hours as needed for wheezing or shortness of breath. 150 mL 5   albuterol (VENTOLIN HFA) 108 (90 Base) MCG/ACT inhaler Inhale 2 puffs into the lungs every 6 (six) hours as needed for wheezing or shortness of breath. 8 g 5   allopurinol (ZYLOPRIM) 100 MG tablet Take 100 mg by mouth daily.     bisacodyl (DULCOLAX) 5 MG EC tablet Take 1 tablet (5 mg total) by mouth daily as needed for moderate constipation. (Patient taking differently: Take 5 mg by mouth daily.) 30 tablet 0   cetirizine (ZYRTEC) 10 MG tablet Take 10 mg by mouth daily.     diclofenac Sodium (VOLTAREN) 1 % GEL Apply 1 application. topically daily as needed (pain).     feeding supplement (ENSURE ENLIVE / ENSURE PLUS) LIQD Take 237 mLs by mouth 2 (two) times daily between meals. 237 mL 12   ferrous fumarate-b12-vitamic C-folic acid (TRINSICON / FOLTRIN) capsule Take 1 capsule by mouth 2 (two) times daily after a meal.     ferrous sulfate 325 (65 FE) MG tablet Take 1 tablet (325 mg total) by mouth daily. 30 tablet 1   fluticasone (FLONASE) 50 MCG/ACT nasal spray Place 2 sprays into both nostrils daily as needed for allergies or rhinitis.     gabapentin (NEURONTIN) 300 MG capsule Take 300 mg by mouth 3 (three) times daily.     latanoprost (XALATAN) 0.005 % ophthalmic solution Place 1 drop into both eyes at bedtime.     mometasone-formoterol (DULERA) 100-5 MCG/ACT AERO Inhale 2 puffs by mouth twice daily 13 g 3   nicotine (NICODERM CQ - DOSED IN MG/24 HOURS) 21 mg/24hr patch Place 1 patch (21 mg total)  onto the skin daily. 28 patch 5   oxyCODONE (OXY IR/ROXICODONE) 5 MG immediate release tablet Take 1 tablet (5 mg total) by mouth every 4 (four) hours as needed. 30 tablet 0   Oxycodone HCl 10 MG TABS Take by mouth.     QUEtiapine (SEROQUEL XR) 300 MG 24 hr tablet Take 150 mg by mouth 2 (two) times daily.     sucralfate (CARAFATE) 1 g tablet Take 1 g by mouth 4 (four) times daily.     thiamine 100 MG tablet Take 1 tablet (100 mg total) by mouth daily. 30 tablet 0   Tiotropium Bromide Monohydrate (SPIRIVA RESPIMAT) 2.5 MCG/ACT AERS Inhale 2 puffs into the lungs daily. 4 g 11   traZODone (DESYREL) 150 MG tablet Take  150 mg by mouth at bedtime as needed.     zolpidem (AMBIEN) 10 MG tablet Take 10 mg by mouth at bedtime.       ROS: Constitutional: No fever or chills Vision: No changes in vision ENT: No difficulty swallowing CV: No chest pain Pulm: No SOB or wheezing GI: No nausea or vomiting GU: No urgency or inability to hold urine Skin: No poor wound healing Neurologic: No numbness or tingling Psychiatric: No depression or anxiety Heme: No bruising Allergic: No reaction to medications or food   Exam: Blood pressure 129/80, pulse (!) 108, temperature 98.5 F (36.9 C), temperature source Oral, resp. rate 20, height 5\' 11"  (1.803 m), weight 71.7 kg, SpO2 100 %. General: No acute distress Orientation: Awake alert and oriented x3 Mood and Affect: Cooperative and pleasant Gait: Unable to assess due to his fractures Coordination and balance: Within normal limits  Left lower extremity: Moderate to large sized knee effusion.  Pain with any attempted range of motion.  Moderate swelling and pitting edema to the lower extremity.  Compartments are soft and compressible.  Active dorsiflexion plantarflexion of the ankle and foot.  Sensation intact light touch to all nerve distributions.  Warm well-perfused foot with brisk cap refill.  Right lower extremity: Ecchymosis and swelling to the proximal  tibia.  Moderate knee effusion.  Pain with any attempted range of motion compartments are soft compressible.  Active dorsiflexion plantarflexion of the ankle with sensation intact to all nerve distributions.  Warm well-perfused foot.  Upper extremities: Skin without lesions. No tenderness to palpation. Full painless ROM, full strength in each muscle groups without evidence of instability.   Medical Decision Making: Data: Imaging: Bilateral knee x-rays and CT scans were reviewed which shows a nondisplaced distal femur fracture that is extra-articular that is below the previous hip nail.  There is also a right proximal tibia fracture with minimal displacement.  Does not appear to involve the knee joint significantly.  Labs:  Results for orders placed or performed during the hospital encounter of 02/21/22 (from the past 24 hour(s))  CBC     Status: Abnormal   Collection Time: 02/21/22  7:03 AM  Result Value Ref Range   WBC 6.5 4.0 - 10.5 K/uL   RBC 2.31 (L) 4.22 - 5.81 MIL/uL   Hemoglobin 8.3 (L) 13.0 - 17.0 g/dL   HCT 02/23/22 (L) 73.5 - 32.9 %   MCV 106.5 (H) 80.0 - 100.0 fL   MCH 35.9 (H) 26.0 - 34.0 pg   MCHC 33.7 30.0 - 36.0 g/dL   RDW 92.4 26.8 - 34.1 %   Platelets 121 (L) 150 - 400 K/uL   nRBC 0.0 0.0 - 0.2 %  Type and screen [facility]     Status: None (Preliminary result)   Collection Time: 02/21/22  7:03 AM  Result Value Ref Range   ABO/RH(D) PENDING    Antibody Screen PENDING    Sample Expiration      02/24/2022,2359 Performed at [facility], [address], [place], [place]      Imaging or Labs ordered: None  Medical history and chart was reviewed and case discussed with medical provider.  Assessment/Plan: 61 year old male with a history of COPD on home oxygen with left distal femur fracture and right proximal tibia fracture  Due to the bilateral nature of his injuries as well as the severe pain that he is having mobilizing I would  recommend proceeding with open reduction internal  fixation.  Risks and benefits were discussed with the patient.  Risks include but not limited to bleeding, infection, malunion, nonunion, hardware failure, hardware irritation, nerve or blood vessel injury, DVT, even the possible anesthetic complications.  He agreed to proceed with surgery and consent was obtained.   Roby Lofts, MD [facility] [phone] (office) [email or web address]

## 2022-02-21 NOTE — Progress Notes (Signed)
patients bp 116/86 hr 129    recheck 93/75 hr 118.  MD notified

## 2022-02-21 NOTE — H&P (View-Only) (Signed)
Orthopaedic Trauma Service (OTS) Consult   Patient ID: Perry Bullock MRN: [id number] DOB/AGE: June 24, 1961 61 y.o.  Reason for Consult:Left distal femur and right proximal tibia fracture Referring Physician: Dr. Ross Marcus, MD [facility]  HPI: Perry Bullock is an 61 y.o. male who is being seen in consultation at the request of Dr. Okey Dupre for evaluation of bilateral lower extremity injuries.  The patient usually ambulates with the use of rolling walker.  He is on home oxygen due to COPD.  He fell on bilateral lower extremities had severe pain in his left knee and also pain and swelling in his right knee.  Bilateral CT scans of his knees show a nondisplaced distal femur fracture below his previous hip nail.  He also had a proximal extra-articular tibial fracture.  Due to the complexity of the injuries Dr. Okey Dupre felt that this was outside the scope of practice and required treatment by an orthopedic traumatologist.  He was transferred to [facility] last night.  Currently comfortable.  States that his pain is manageable if he does not move it.  Otherwise he cannot bear the pain.  Has not been able to mobilize at all.  He is on home oxygen.  He ambulates with a walker at baseline.  He has a history of gout.  Past Medical History:  Diagnosis Date   Anxiety    Arthritis    Asthma    Bipolar 1 disorder (HCC)    Blind right eye    Broken hip (HCC)    Chronic back pain    Diverticulosis   Chronic headaches    Convulsion (HCC)    once a month when stands up too quickly   COPD (chronic obstructive pulmonary disease) (HCC)    DDD (degenerative disc disease), lumbar    Diverticulitis    GERD (gastroesophageal reflux disease)    Glaucoma    Hepatitis    hepatitis C   Pneumonia    PVD (peripheral vascular disease) (HCC)    lower extremities reddened and feet swollen   Seasonal allergies    Shortness of breath dyspnea     Past Surgical History:  Procedure Laterality Date    BIOPSY  10/09/2021   Procedure: BIOPSY;  Surgeon: Jenel Lucks, MD;  Location: [facility] ENDOSCOPY;  Service: Gastroenterology;;   ESOPHAGOGASTRODUODENOSCOPY (EGD) WITH PROPOFOL N/A 10/09/2021   Procedure: ESOPHAGOGASTRODUODENOSCOPY (EGD) WITH PROPOFOL;  Surgeon: Jenel Lucks, MD;  Location: [facility] ENDOSCOPY;  Service: Gastroenterology;  Laterality: N/A;   EYE SURGERY Right    HIP SURGERY Left    INTRAMEDULLARY (IM) NAIL INTERTROCHANTERIC Right 01/16/2017   Procedure: INTRAMEDULLARY (IM) NAIL INTERTROCHANTRIC;  Surgeon: Christena Flake, MD;  Location: [facility] ORS;  Service: Orthopedics;  Laterality: Right;   INTRAMEDULLARY (IM) NAIL INTERTROCHANTERIC Left 03/09/2020   Procedure: INTRAMEDULLARY (IM) NAIL INTERTROCHANTRIC;  Surgeon: Juanell Fairly, MD;  Location: [facility] ORS;  Service: Orthopedics;  Laterality: Left;   INTUBATION-ENDOTRACHEAL WITH TRACHEOSTOMY STANDBY N/A 04/22/2015   Procedure: INTUBATION-ENDOTRACHEAL WITH TRACHEOSTOMY STANDBY;  Surgeon: Linus Salmons, MD;  Location: [facility] ORS;  Service: ENT;  Laterality: N/A;   JOINT REPLACEMENT Left    Lt shoulder   MANDIBLE SURGERY      Family History  Problem Relation Age of Onset   Breast cancer Mother    Hypertension Father    Diabetes Father     Social History:  reports that he has been smoking cigarettes. He has never used smokeless tobacco. He reports current alcohol use of about 3.0  standard drinks of alcohol per week. He reports that he does not use drugs.  Allergies:  Allergies  Allergen Reactions   Ace Inhibitors Swelling    Angioedema 10/16 requiring intubation @ OSH 2/2 pt reportedly taking someone else's Lisinopril.  Angioedema 10/16 requiring intubation @ OSH 2/2 pt reportedly taking someone else's Lisinopril.     Lisinopril Other (See Comments)    unknown    Medications:  No current facility-administered medications on file prior to encounter.   Current Outpatient Medications on File Prior to Encounter  Medication  Sig Dispense Refill   Multiple Vitamin (MULTIVITAMIN WITH MINERALS) TABS tablet Take 1 tablet by mouth daily. 30 tablet 0   pantoprazole (PROTONIX) 40 MG tablet Take 1 tablet (40 mg total) by mouth 2 (two) times daily. 112 tablet 0   ADVAIR DISKUS 500-50 MCG/ACT AEPB Inhale 1 puff into the lungs in the morning and at bedtime.     albuterol (PROVENTIL) (2.5 MG/3ML) 0.083% nebulizer solution Take 3 mLs (2.5 mg total) by nebulization every 6 (six) hours as needed for wheezing or shortness of breath. 150 mL 5   albuterol (VENTOLIN HFA) 108 (90 Base) MCG/ACT inhaler Inhale 2 puffs into the lungs every 6 (six) hours as needed for wheezing or shortness of breath. 8 g 5   allopurinol (ZYLOPRIM) 100 MG tablet Take 100 mg by mouth daily.     bisacodyl (DULCOLAX) 5 MG EC tablet Take 1 tablet (5 mg total) by mouth daily as needed for moderate constipation. (Patient taking differently: Take 5 mg by mouth daily.) 30 tablet 0   cetirizine (ZYRTEC) 10 MG tablet Take 10 mg by mouth daily.     diclofenac Sodium (VOLTAREN) 1 % GEL Apply 1 application. topically daily as needed (pain).     feeding supplement (ENSURE ENLIVE / ENSURE PLUS) LIQD Take 237 mLs by mouth 2 (two) times daily between meals. 237 mL 12   ferrous fumarate-b12-vitamic C-folic acid (TRINSICON / FOLTRIN) capsule Take 1 capsule by mouth 2 (two) times daily after a meal.     ferrous sulfate 325 (65 FE) MG tablet Take 1 tablet (325 mg total) by mouth daily. 30 tablet 1   fluticasone (FLONASE) 50 MCG/ACT nasal spray Place 2 sprays into both nostrils daily as needed for allergies or rhinitis.     gabapentin (NEURONTIN) 300 MG capsule Take 300 mg by mouth 3 (three) times daily.     latanoprost (XALATAN) 0.005 % ophthalmic solution Place 1 drop into both eyes at bedtime.     mometasone-formoterol (DULERA) 100-5 MCG/ACT AERO Inhale 2 puffs by mouth twice daily 13 g 3   nicotine (NICODERM CQ - DOSED IN MG/24 HOURS) 21 mg/24hr patch Place 1 patch (21 mg total)  onto the skin daily. 28 patch 5   oxyCODONE (OXY IR/ROXICODONE) 5 MG immediate release tablet Take 1 tablet (5 mg total) by mouth every 4 (four) hours as needed. 30 tablet 0   Oxycodone HCl 10 MG TABS Take by mouth.     QUEtiapine (SEROQUEL XR) 300 MG 24 hr tablet Take 150 mg by mouth 2 (two) times daily.     sucralfate (CARAFATE) 1 g tablet Take 1 g by mouth 4 (four) times daily.     thiamine 100 MG tablet Take 1 tablet (100 mg total) by mouth daily. 30 tablet 0   Tiotropium Bromide Monohydrate (SPIRIVA RESPIMAT) 2.5 MCG/ACT AERS Inhale 2 puffs into the lungs daily. 4 g 11   traZODone (DESYREL) 150 MG tablet Take  150 mg by mouth at bedtime as needed.     zolpidem (AMBIEN) 10 MG tablet Take 10 mg by mouth at bedtime.       ROS: Constitutional: No fever or chills Vision: No changes in vision ENT: No difficulty swallowing CV: No chest pain Pulm: No SOB or wheezing GI: No nausea or vomiting GU: No urgency or inability to hold urine Skin: No poor wound healing Neurologic: No numbness or tingling Psychiatric: No depression or anxiety Heme: No bruising Allergic: No reaction to medications or food   Exam: Blood pressure 129/80, pulse (!) 108, temperature 98.5 F (36.9 C), temperature source Oral, resp. rate 20, height 5\' 11"  (1.803 m), weight 71.7 kg, SpO2 100 %. General: No acute distress Orientation: Awake alert and oriented x3 Mood and Affect: Cooperative and pleasant Gait: Unable to assess due to his fractures Coordination and balance: Within normal limits  Left lower extremity: Moderate to large sized knee effusion.  Pain with any attempted range of motion.  Moderate swelling and pitting edema to the lower extremity.  Compartments are soft and compressible.  Active dorsiflexion plantarflexion of the ankle and foot.  Sensation intact light touch to all nerve distributions.  Warm well-perfused foot with brisk cap refill.  Right lower extremity: Ecchymosis and swelling to the proximal  tibia.  Moderate knee effusion.  Pain with any attempted range of motion compartments are soft compressible.  Active dorsiflexion plantarflexion of the ankle with sensation intact to all nerve distributions.  Warm well-perfused foot.  Upper extremities: Skin without lesions. No tenderness to palpation. Full painless ROM, full strength in each muscle groups without evidence of instability.   Medical Decision Making: Data: Imaging: Bilateral knee x-rays and CT scans were reviewed which shows a nondisplaced distal femur fracture that is extra-articular that is below the previous hip nail.  There is also a right proximal tibia fracture with minimal displacement.  Does not appear to involve the knee joint significantly.  Labs:  Results for orders placed or performed during the hospital encounter of 02/21/22 (from the past 24 hour(s))  CBC     Status: Abnormal   Collection Time: 02/21/22  7:03 AM  Result Value Ref Range   WBC 6.5 4.0 - 10.5 K/uL   RBC 2.31 (L) 4.22 - 5.81 MIL/uL   Hemoglobin 8.3 (L) 13.0 - 17.0 g/dL   HCT 02/23/22 (L) 73.5 - 32.9 %   MCV 106.5 (H) 80.0 - 100.0 fL   MCH 35.9 (H) 26.0 - 34.0 pg   MCHC 33.7 30.0 - 36.0 g/dL   RDW 92.4 26.8 - 34.1 %   Platelets 121 (L) 150 - 400 K/uL   nRBC 0.0 0.0 - 0.2 %  Type and screen [facility]     Status: None (Preliminary result)   Collection Time: 02/21/22  7:03 AM  Result Value Ref Range   ABO/RH(D) PENDING    Antibody Screen PENDING    Sample Expiration      02/24/2022,2359 Performed at [facility], [address], [place], [place]      Imaging or Labs ordered: None  Medical history and chart was reviewed and case discussed with medical provider.  Assessment/Plan: 61 year old male with a history of COPD on home oxygen with left distal femur fracture and right proximal tibia fracture  Due to the bilateral nature of his injuries as well as the severe pain that he is having mobilizing I would  recommend proceeding with open reduction internal  fixation.  Risks and benefits were discussed with the patient.  Risks include but not limited to bleeding, infection, malunion, nonunion, hardware failure, hardware irritation, nerve or blood vessel injury, DVT, even the possible anesthetic complications.  He agreed to proceed with surgery and consent was obtained.   Roby Lofts, MD [facility] [phone] (office) [email or web address]

## 2022-02-21 NOTE — Anesthesia Preprocedure Evaluation (Addendum)
Anesthesia Evaluation  Patient identified by MRN, date of birth, ID band Patient awake    Reviewed: Allergy & Precautions, NPO status , Patient's Chart, lab work & pertinent test results  Airway Mallampati: I  TM Distance: >3 FB Neck ROM: Full    Dental  (+) Edentulous Upper, Edentulous Lower   Pulmonary asthma , COPD,  COPD inhaler, Current Smoker and Patient abstained from smoking.,     + decreased breath sounds      Cardiovascular + Peripheral Vascular Disease   Rhythm:Regular Rate:Normal     Neuro/Psych  Headaches, Seizures -,  PSYCHIATRIC DISORDERS Anxiety Depression Bipolar Disorder    GI/Hepatic GERD  Medicated,(+) Hepatitis -, C  Endo/Other  negative endocrine ROS  Renal/GU negative Renal ROS     Musculoskeletal   Abdominal   Peds  Hematology negative hematology ROS (+)   Anesthesia Other Findings   Reproductive/Obstetrics                            Anesthesia Physical Anesthesia Plan  ASA: 3  Anesthesia Plan: General   Post-op Pain Management:    Induction: Intravenous  PONV Risk Score and Plan: 2 and Ondansetron, Dexamethasone and Midazolam  Airway Management Planned: Oral ETT  Additional Equipment: None  Intra-op Plan:   Post-operative Plan: Extubation in OR  Informed Consent: I have reviewed the patients History and Physical, chart, labs and discussed the procedure including the risks, benefits and alternatives for the proposed anesthesia with the patient or authorized representative who has indicated his/her understanding and acceptance.       Plan Discussed with: CRNA  Anesthesia Plan Comments:        Anesthesia Quick Evaluation

## 2022-02-21 NOTE — Progress Notes (Signed)
Orthopedic Tech Progress Note Patient Details:  Perry Bullock [birth year] [id number]  Patient ID: Perry Bullock, male   DOB: August 04, 1960, 61 y.o.   MRN: [id number] Reviewing chart for OHF limitations. Darleen Crocker 02/21/2022, 8:55 AM

## 2022-02-22 DIAGNOSIS — S72402A Unspecified fracture of lower end of left femur, initial encounter for closed fracture: Secondary | ICD-10-CM | POA: Diagnosis not present

## 2022-02-22 LAB — CBC
HCT: 23.7 % — ABNORMAL LOW (ref 39.0–52.0)
Hemoglobin: 7.7 g/dL — ABNORMAL LOW (ref 13.0–17.0)
MCH: 35.3 pg — ABNORMAL HIGH (ref 26.0–34.0)
MCHC: 32.5 g/dL (ref 30.0–36.0)
MCV: 108.7 fL — ABNORMAL HIGH (ref 80.0–100.0)
Platelets: 124 10*3/uL — ABNORMAL LOW (ref 150–400)
RBC: 2.18 MIL/uL — ABNORMAL LOW (ref 4.22–5.81)
RDW: 12.2 % (ref 11.5–15.5)
WBC: 7.5 10*3/uL (ref 4.0–10.5)
nRBC: 0 % (ref 0.0–0.2)

## 2022-02-22 LAB — BASIC METABOLIC PANEL
Anion gap: 7 (ref 5–15)
BUN: 9 mg/dL (ref 8–23)
CO2: 27 mmol/L (ref 22–32)
Calcium: 8.1 mg/dL — ABNORMAL LOW (ref 8.9–10.3)
Chloride: 101 mmol/L (ref 98–111)
Creatinine, Ser: 0.72 mg/dL (ref 0.61–1.24)
GFR, Estimated: >60 mL/min (ref >60)
Glucose, Bld: 140 mg/dL — ABNORMAL HIGH (ref 70–99)
Potassium: 4.2 mmol/L (ref 3.5–5.1)
Sodium: 135 mmol/L (ref 135–145)

## 2022-02-22 NOTE — Evaluation (Signed)
Physical Therapy Evaluation Patient Details Name: Perry Bullock MRN: [id number] DOB: 1960/09/14 Today's Date: 02/22/2022  History of Present Illness  61 y.o.m admitted 8/25 for BIL LE fractures. S/p ORIF Lt distal femur, ORIF Rt proximal tibia. PHMx: bipolar disorder, COPD on nocturnal oxygen,alcohol abuse, and gout who has been transferred from [facility].  Clinical Impression  Patient is s/p above surgery resulting in functional limitations due to the deficits listed below (see PT Problem List). Min assist for LE support with bed mobility. Able to actively flex and extend knees slowly in bed. Performed SLR with lag BIL. Required max assist for sit<>stand transfer with RW, unable to functionally tolerate WB through RLE. Lives with retired Engineer, civil (consulting) who is his girlfriend and will be available 24/7  per pt. Patient would greatly benefit for AIR to progress independence with transfers and mobility prior to returning home. Patient will benefit from skilled PT to increase their independence and safety with mobility to allow discharge to the venue listed below.           Recommendations for follow up therapy are one component of a multi-disciplinary discharge planning process, led by the attending physician.  Recommendations may be updated based on patient status, additional functional criteria and insurance authorization.  Follow Up Recommendations Acute inpatient rehab (3hours/day)      Assistance Recommended at Discharge Frequent or constant Supervision/Assistance  Patient can return home with the following  A lot of help with bathing/dressing/bathroom;Two people to help with walking and/or transfers;Assistance with cooking/housework;Help with stairs or ramp for entrance;Assist for transportation    Equipment Recommendations None recommended by PT (TBD next venue of care - likely w/c)  Recommendations for Other Services  Rehab consult    Functional Status Assessment Patient has had a recent decline  in their functional status and demonstrates the ability to make significant improvements in function in a reasonable and predictable amount of time.     Precautions / Restrictions Precautions Precautions: Fall Restrictions Weight Bearing Restrictions: Yes RLE Weight Bearing: Non weight bearing LLE Weight Bearing: Weight bearing as tolerated Other Position/Activity Restrictions: RLE can be WBAT for transfers only; NWB for ambulation      Mobility  Bed Mobility Overal bed mobility: Needs Assistance Bed Mobility: Supine to Sit, Sit to Sidelying, Rolling Rolling: Min assist   Supine to sit: Min assist, HOB elevated   Sit to sidelying: Min assist General bed mobility comments: Min assist for LE support 2/2 pain with mobility to EOB. Educated on sit to sidelying transition with min assist for LEs, as well as rolling with assist for LEs. Good UE strength to use rail and bed as needed and scoots well without assistance.    Transfers Overall transfer level: Needs assistance Equipment used: Rolling walker (2 wheels) Transfers: Sit to/from Stand Sit to Stand: Max assist, From elevated surface           General transfer comment: Educated on set-up, positioning, and mechanics with sit<>stand transfer. Performed from bed slightly elevated due to height. Cues for hand placement and anterior weight shift. Attempted twice with assistance but unable to stand upright using BIL LEs 2/2 pain in RLE. Had pt extend RLE fully and only WB through LLE and UEs and was able to stand upright with max assist.    Ambulation/Gait             Pre-gait activities: Stood EOB holding RW, practiced moving feet, unable to tolerate RLE WB.    Stairs  Wheelchair Mobility    Modified Rankin (Stroke Patients Only)       Balance Overall balance assessment: Needs assistance Sitting-balance support: No upper extremity supported, Feet supported Sitting balance-Leahy Scale: Good      Standing balance support: Bilateral upper extremity supported, Reliant on assistive device for balance Standing balance-Leahy Scale: Poor                               Pertinent Vitals/Pain Pain Assessment Pain Assessment: Faces Faces Pain Scale: Hurts whole lot Pain Location: BIl LEs Pain Descriptors / Indicators: Aching, Constant, Grimacing Pain Intervention(s): Limited activity within patient's tolerance, Monitored during session, Premedicated before session, Repositioned    Home Living Family/patient expects to be discharged to:: Private residence Living Arrangements: Spouse/significant other (girl friend is a Engineer, civil (consulting)) Available Help at Discharge: Family;Available 24 hours/day Type of Home: House Home Access: Stairs to enter Entrance Stairs-Rails: Doctor, general practice of Steps: 3   Home Layout: One level Home Equipment: BSC/3in1;Rolling Walker (2 wheels);Cane - single point;Cane - quad;Grab bars - tub/shower;Rollator (4 wheels);Tub bench Additional Comments: Taking bird bath due to difficulty getting legs into tub.    Prior Function Prior Level of Function : Needs assist             Mobility Comments: Walking with RW, several falls ADLs Comments: Girlfriend assists.     Hand Dominance   Dominant Hand: Right    Extremity/Trunk Assessment   Upper Extremity Assessment Upper Extremity Assessment: Overall WFL for tasks assessed    Lower Extremity Assessment Lower Extremity Assessment: RLE deficits/detail;LLE deficits/detail RLE Deficits / Details: able to SLR with >20 deg lag RLE: Unable to fully assess due to pain LLE Deficits / Details: Able to SLR with approx 15 deg lag LLE: Unable to fully assess due to pain       Communication   Communication: HOH  Cognition Arousal/Alertness: Awake/alert Behavior During Therapy: WFL for tasks assessed/performed Overall Cognitive Status: Within Functional Limits for tasks assessed                                           General Comments General comments (skin integrity, edema, etc.): Educated on LE positioning to prevent contractures.    Exercises General Exercises - Lower Extremity Ankle Circles/Pumps: AROM, Both, 15 reps, Supine Heel Slides: AAROM, Both, 5 reps, Supine   Assessment/Plan    PT Assessment Patient needs continued PT services  PT Problem List Decreased strength;Decreased range of motion;Decreased activity tolerance;Decreased balance;Decreased mobility;Decreased knowledge of use of DME;Pain       PT Treatment Interventions DME instruction;Gait training;Functional mobility training;Therapeutic activities;Therapeutic exercise;Balance training;Neuromuscular re-education;Patient/family education;Wheelchair mobility training;Modalities    PT Goals (Current goals can be found in the Care Plan section)  Acute Rehab PT Goals Patient Stated Goal: Get better before going home PT Goal Formulation: With patient Time For Goal Achievement: 03/01/22 Potential to Achieve Goals: Good    Frequency Min 4X/week     Co-evaluation               AM-PAC PT "6 Clicks" Mobility  Outcome Measure Help needed turning from your back to your side while in a flat bed without using bedrails?: A Little Help needed moving from lying on your back to sitting on the side of a flat bed without using bedrails?: A Little  Help needed moving to and from a bed to a chair (including a wheelchair)?: A Little Help needed standing up from a chair using your arms (e.g., wheelchair or bedside chair)?: A Lot Help needed to walk in hospital room?: Total Help needed climbing 3-5 steps with a railing? : Total 6 Click Score: 13    End of Session Equipment Utilized During Treatment: Gait belt Activity Tolerance: Patient limited by pain Patient left: in bed;with call bell/phone within reach;with bed alarm set Nurse Communication: Mobility status;Weight bearing status PT Visit  Diagnosis: History of falling (Z91.81);Difficulty in walking, not elsewhere classified (R26.2);Pain Pain - Right/Left:  (BIL) Pain - part of body: Leg    Time: 1254-1330 PT Time Calculation (min) (ACUTE ONLY): 36 min   Charges:   PT Evaluation $PT Eval Moderate Complexity: 1 Mod PT Treatments $Therapeutic Activity: 8-22 mins        Candie Mile, PT, DPT Physical Therapist [facility] [place]   Ellouise Newer 02/22/2022, 2:10 PM

## 2022-02-22 NOTE — Progress Notes (Signed)
Inpatient Rehab Admissions Coordinator Note:   Per therapy patient was screened for CIR candidacy by Hjalmer Iovino Luvenia Starch, CCC-SLP. At this time, pt appears to be a potential candidate for CIR. I will place an order for rehab consult for full assessment, per our protocol.  Please contact me any with questions.Wolfgang Phoenix, MS, CCC-SLP Admissions Coordinator [phone] 02/22/22 2:21 PM

## 2022-02-22 NOTE — Evaluation (Signed)
Occupational Therapy Evaluation Patient Details Name: Perry Bullock MRN: [id number] DOB: December 16, 1960 Today's Date: 02/22/2022   History of Present Illness 61 y.o.m admitted 8/25 for BIL LE fractures. S/p ORIF Lt distal femur, ORIF Rt proximal tibia. PHMx: bipolar disorder, COPD on nocturnal oxygen,alcohol abuse, and gout who has been transferred from [facility].   Clinical Impression   This 61 yo male admitted and underwent above presents to acute OT with PLOF of being able to do most of his basic ADLs with girlfriend A prn and pt ambulating with RW pta. He currently is setup/S-total A for basic ADLs and would benefit from AIR to get to a setup/S or higher level prior to D/C'ing home with girlfriend. He will continue to benefit from acute OT.      Recommendations for follow up therapy are one component of a multi-disciplinary discharge planning process, led by the attending physician.  Recommendations may be updated based on patient status, additional functional criteria and insurance authorization.   Follow Up Recommendations  Acute inpatient rehab (3hours/day)    Assistance Recommended at Discharge Frequent or constant Supervision/Assistance  Patient can return home with the following Two people to help with walking and/or transfers;Two people to help with bathing/dressing/bathroom;Assistance with cooking/housework;Help with stairs or ramp for entrance;Assist for transportation    Functional Status Assessment  Patient has had a recent decline in their functional status and demonstrates the ability to make significant improvements in function in a reasonable and predictable amount of time.  Equipment Recommendations  None recommended by OT    Recommendations for Other Services Rehab consult     Precautions / Restrictions Precautions Precautions: Fall Restrictions Weight Bearing Restrictions: Yes RLE Weight Bearing: Non weight bearing LLE Weight Bearing: Weight bearing as  tolerated Other Position/Activity Restrictions: RLE can be WBAT for transfers only; NWB for ambulation      Mobility Bed Mobility               General bed mobility comments: Pt just finished with PT and did not want to get back up again. He can bend each leg at knee with great effort, he can move LLE slowly and with great effort for abduction/adduction while, he cannot move RLE for abduction/adduction all while supine in bed, he can come up into log sitting in bed with HOB starting around 30 degrees           ADL either performed or assessed with clinical judgement   ADL Overall ADL's : Needs assistance/impaired Eating/Feeding: Independent;Sitting   Grooming: Set up;Sitting   Upper Body Bathing: Set up;Sitting   Lower Body Bathing: Maximal assistance;Bed level   Upper Body Dressing : Set up;Sitting   Lower Body Dressing: Total assistance;Bed level                       Vision Baseline Vision/History: 2 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; cataracts and 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) Ability to See in Adequate Light: 1 Impaired Patient Visual Report: No change from baseline       Perception     Praxis      Pertinent Vitals/Pain Pain Assessment Pain Assessment: 0-10 Faces Pain Scale: Hurts whole lot Pain Location: BIl LEs Pain Descriptors / Indicators: Aching, Constant, Grimacing Pain Intervention(s): Limited activity within patient's tolerance, Monitored during session, Repositioned     Hand Dominance Right   Extremity/Trunk Assessment Upper Extremity Assessment Upper Extremity Assessment: Overall WFL for tasks assessed  Communication Communication Communication: HOH   Cognition Arousal/Alertness: Awake/alert Behavior During Therapy: WFL for tasks assessed/performed Overall Cognitive Status: Within Functional Limits for tasks assessed                                                  Home Living Family/patient expects to be  discharged to:: Private residence Living Arrangements: Spouse/significant other (girl friend is a Engineer, civil (consulting)) Available Help at Discharge: Family;Available 24 hours/day Type of Home: House Home Access: Stairs to enter [employer] of Steps: 3 Entrance Stairs-Rails: Right;Left Home Layout: One level     Bathroom Shower/Tub: Chief Strategy Officer: Standard Bathroom Accessibility: Yes   Home Equipment: Teacher, English as a foreign language (2 wheels);Cane - single point;Cane - quad;Grab bars - tub/shower;Rollator (4 wheels);Tub bench   Additional Comments: Taking bird bath due to difficulty getting legs into tub.      Prior Functioning/Environment Prior Level of Function : Needs assist             Mobility Comments: Walking with RW, several falls ADLs Comments: Girlfriend assists.        OT Problem List: Decreased strength;Decreased range of motion;Impaired balance (sitting and/or standing);Pain      OT Treatment/Interventions: Self-care/ADL training;Balance training;Patient/family education;DME and/or AE instruction    OT Goals(Current goals can be found in the care plan section) Acute Rehab OT Goals Patient Stated Goal: for pain to get better and go home OT Goal Formulation: With patient Time For Goal Achievement: 03/08/22 Potential to Achieve Goals: Good  OT Frequency: Min 2X/week       AM-PAC OT "6 Clicks" Daily Activity     Outcome Measure Help from another person eating meals?: None Help from another person taking care of personal grooming?: A Little Help from another person toileting, which includes using toliet, bedpan, or urinal?: A Lot Help from another person bathing (including washing, rinsing, drying)?: A Lot Help from another person to put on and taking off regular upper body clothing?: A Little Help from another person to put on and taking off regular lower body clothing?: A Lot 6 Click Score: 16   End of Session    Activity Tolerance: Patient  limited by pain Patient left: in bed;with call bell/phone within reach;with bed alarm set  OT Visit Diagnosis: Other abnormalities of gait and mobility (R26.89);Muscle weakness (generalized) (M62.81);Pain Pain - Right/Left:  (both) Pain - part of body: Leg                Time: 1335-1346 OT Time Calculation (min): 11 min Charges:  OT General Charges $OT Visit: 1 Visit OT Evaluation $OT Eval Low Complexity: 1 Low  Ignacia Palma, OTR/L [facility] [phone] Office [phone]    Evette Georges 02/22/2022, 1:57 PM

## 2022-02-22 NOTE — Progress Notes (Signed)
PROGRESS NOTE LARIN BRALLIER  [id number] DOB: 01/16/1961 DOA: 02/21/2022 PCP: [facility] [place]   Brief Narrative/Hospital Course: 61 y.o.m w/ history significant for bipolar disorder, COPD on nocturnal oxygen,alcohol abuse, and gout who has been transferred from [facility] for evaluation of bilateral lower extremity fractures by orthopedic trauma specialist. He normally ambulates with a walker, was experiencing some pain and swelling in his foot that he attributed to a gout flare, and was having some difficulty ambulating due to this.  He reports stumbling and falling onto his knees, experiencing severe bilateral knee pain, and was unable to stand up on his own. [facility] ED and Hospital Course: Upon arrival to the ED, patient is found to be afebrile and saturating well on room air with slight tachycardia and stable blood pressure.  EKG was notable for RBBB and QTc of 500 ms. He was treated with prednisone and colchicine in the ED, was still unable to ambulate, and was admitted to the hospital.  Orthopedic surgery was consulted, plain radiographs of the bilateral knees were obtained, and bilateral fractures were noted.  These were further assessed with CT, demonstrating acute intra-articular fracture of the distal left femur with large lipohemarthrosis, as well as acute fracture of the right proximal tibia with intra-articular extension, and acute fibular head and neck fractures.Ortho Dr Lajuan Lines [facility] discussed the case with orthopedic trauma specialist at [place] and transferred for evaluation. Patient underwent ORIF of right tibial plateau fracture, left distal femur fracture and removal of hardware from left femur by Dr Doreatha Martin 02/21/22    Subjective: Seen this am C/o soreness in both legs On home o2 Overnight BP has been soft, afebrile, on room air Labs with H&H trending down 7.7 g stable BMP   Assessment and Plan: Principal Problem:   Closed fracture of left distal femur  ([place]) Active Problems:   Alcohol abuse   Thrombocytopenia (HCC)   Bipolar disorder (HCC)   COPD (chronic obstructive pulmonary disease) (HCC)   Macrocytic anemia   Closed fracture of right proximal tibia   Hyponatremia   Prolonged QT interval   Closed fracture of distal end of left femur ([place])   Distal left femur fracture  Closed fracture of right proximal tibia Status post fall: S/p ORIF of right tibial plateau fracture, left distal femur fracture and removal of hardware from left femur by Dr Doreatha Martin 02/21/22.  Starting Lovenox for DVT prophylaxis, continue PT OT pain control and disposition plan.  Appreciate orthopedics input   COPD-not in exacerbation continue Dulera Spiriva.  Chronic hypoxic respiratory failure on nocturnal oxygen, continue the same.  Encourage incentive spirometry. PTOT and mobilize  Macrocytic anemia Acute blood loss anemia in the setting of hip fracture/surgery: Trend H&H transfuse if less than 7 g. Recent Labs  Lab 02/19/22 0911 02/20/22 0517 02/21/22 0703 02/22/22 0424  HGB 11.7* 8.6* 8.3* 7.7*  HCT 33.8* 24.6* 24.6* 23.7*    Hyponatremia: Likely some hypovolemia getting IV fluids.  Resolved Prolonged QT interval-minimize QT prolonging medication. Alcohol abuse: Watch for withdrawals continue CIWA Ativan, supplement/vitamin. TOC consult Thrombocytopenia-monitor platelet count Bipolar disorder: On Seroquel trazodone and Ambien  DVT prophylaxis: enoxaparin (LOVENOX) injection 40 mg Start: 02/22/22 0800 SCDs Start: 02/21/22 1416 SCDs Start: 02/21/22 0428 Code Status:   Code Status: Full Code Family Communication: plan of care discussed with patient at bedside. Patient status is: Inpatient because of multiple fractures Level of care: Med-Surg   Dispo: The patient is from: home  Anticipated disposition: SNF 1-2 days Mobility Assessment (last 72 hours)     Mobility Assessment     Row Name 02/21/22 2048 02/21/22 0240         Does  patient have an order for bedrest or is patient medically unstable No - Continue assessment Yes- Bedfast (Level 1) - Complete      What is the highest level of mobility based on the progressive mobility assessment? Level 2 (Chairfast) - Balance while sitting on edge of bed and cannot stand --      Is the above level different from baseline mobility prior to current illness? Yes - Recommend PT order --                Objective: Vitals last 24 hrs: Vitals:   02/21/22 1825 02/21/22 2144 02/22/22 0736 02/22/22 0740  BP: 93/75 101/70  115/80  Pulse: (!) 118 (!) 108  97  Resp: 17 18  16   Temp: 98.5 F (36.9 C) 97.6 F (36.4 C)  (!) 97.5 F (36.4 C)  TempSrc: Oral Oral  Oral  SpO2: 94% 95% 96% 100%  Weight:      Height:       Weight change: -0.485 kg  Physical Examination: General exam:AAOX3,61 older than stated age, weak appearing. HEENT:Oral mucosa moist, Ear/Nose WNL grossly, dentition normal. Respiratory system: bilaterally diminished BS, no use of accessory muscle Cardiovascular system: S1 & S2 +, No JVD. Gastrointestinal system: Abdomen soft,NT,ND, BS+ Nervous System:Alert, awake, moving extremities and grossly nonfocal Extremities: LE edema mild with b/l legs are in ace wrap Skin: No rashes,no icterus. MSK: Normal muscle bulk,tone, power  Medications reviewed:  Scheduled Meds:  acetaminophen  650 mg Oral Q6H   allopurinol  100 mg Oral Daily   docusate sodium  100 mg Oral BID   enoxaparin (LOVENOX) injection  40 mg Subcutaneous Q24H   feeding supplement  237 mL Oral BID BM   folic acid  1 mg Oral Daily   gabapentin  300 mg Oral TID   latanoprost  1 drop Both Eyes QHS   LORazepam  0-4 mg Intravenous Q6H   Followed by   ON 02/23/2022] LORazepam  0-4 mg Intravenous Q12H   mometasone-formoterol  2 puff Inhalation BID   multivitamin with minerals  1 tablet Oral Daily   nicotine  21 mg Transdermal Daily   pantoprazole  40 mg Oral BID   QUEtiapine  150 mg Oral BID    sucralfate  1 g Oral TID AC & HS   thiamine  100 mg Oral Daily   Or   thiamine  100 mg Intravenous Daily   umeclidinium bromide  1 puff Inhalation Daily   zolpidem  10 mg Oral QHS   Continuous Infusions:  sodium chloride 50 mL/hr at 02/21/22 1505    ceFAZolin (ANCEF) IV 2 g (02/22/22 0226)   methocarbamol (ROBAXIN) IV        Diet Order             Diet Heart Room service appropriate? Yes; Fluid consistency: Thin  Diet effective now                    Nutrition Problem: Increased nutrient needs Etiology: post-op healing Signs/Symptoms: estimated needs Interventions: Ensure Enlive (each supplement provides 350kcal and 20 grams of protein), MVI   Intake/Output Summary (Last 24 hours) at 02/22/2022 0821 Last data filed at 02/22/2022 0409 Gross per 24 hour  Intake 2035.83 ml  Output 925 ml  Net 1110.83 ml   Net IO Since Admission: 1,112.37 mL [02/22/22 0821]  Wt Readings from Last 3 Encounters:  02/21/22 71.2 kg  02/19/22 70.3 kg  10/23/21 70.3 kg     Unresulted Labs (From admission, onward)     Start     Ordered   02/28/22 0500  Creatinine, serum  (enoxaparin (LOVENOX)    CrCl >/= 30 ml/min)  Weekly,   R     Comments: while on enoxaparin therapy    02/21/22 1415   02/22/22 0500  CBC  Daily at 5am,   R      02/21/22 1415   02/21/22 0500  CBC  Daily at 5am,   R      02/21/22 0432   02/21/22 [id number]  Basic metabolic panel  Daily at 5am,   R      02/21/22 0432          Data Reviewed: I have personally reviewed following labs and imaging studies CBC: Recent Labs  Lab 02/19/22 0911 02/20/22 0517 02/21/22 0703 02/22/22 0424  WBC 10.8* 6.6 6.5 7.5  NEUTROABS 8.5*  --   --   --   HGB 11.7* 8.6* 8.3* 7.7*  HCT 33.8* 24.6* 24.6* 23.7*  MCV 102.1* 100.0 106.5* 108.7*  PLT 169 103* 121* [id number]*   Basic Metabolic Panel: Recent Labs  Lab 02/19/22 0911 02/20/22 0517 02/21/22 0703 02/22/22 0424  NA 127* 129* 133* 135  K 4.6 4.3 4.1 4.2  CL 95* 99 102 101   CO2 17* 23 29 27   GLUCOSE 76 191* 101* 140*  BUN <5* 8 8 9   CREATININE 0.38* 0.43* 0.51* 0.72  CALCIUM 7.7* 7.4* 7.6* 8.1*   GFR: Estimated Creatinine Clearance: 97.7 mL/min (by C-G formula based on SCr of 0.72 mg/dL). Liver Function Tests: Recent Labs  Lab 02/19/22 0911 02/21/22 0703  AST 54* 33  ALT 38 28  ALKPHOS 103 78  BILITOT 0.9 0.7  PROT 5.6* 4.5*  ALBUMIN 2.8* 2.3*   No results for input(s): "LIPASE", "AMYLASE" in the last 168 hours. No results for input(s): "AMMONIA" in the last 168 hours. Coagulation Profile: No results for input(s): "INR", "PROTIME" in the last 168 hours. BNP (last 3 results) No results for input(s): "PROBNP" in the last 8760 hours. HbA1C: No results for input(s): "HGBA1C" in the last 72 hours. CBG: No results for input(s): "GLUCAP" in the last 168 hours. Lipid Profile: No results for input(s): "CHOL", "HDL", "LDLCALC", "TRIG", "CHOLHDL", "LDLDIRECT" in the last 72 hours. Thyroid Function Tests: No results for input(s): "TSH", "T4TOTAL", "FREET4", "T3FREE", "THYROIDAB" in the last 72 hours. Sepsis Labs: No results for input(s): "PROCALCITON", "LATICACIDVEN" in the last 168 hours.  No results found for this or any previous visit (from the past 240 hour(s)).  Antimicrobials: Anti-infectives (From admission, onward)    Start     Dose/Rate Route Frequency Ordered Stop   02/21/22 1845  ceFAZolin (ANCEF) IVPB 2g/100 mL premix        2 g 200 mL/hr over 30 Minutes Intravenous Every 8 hours 02/21/22 1415 02/22/22 1844   02/21/22 1140  vancomycin (VANCOCIN) powder  Status:  Discontinued          As needed 02/21/22 1140 02/21/22 1303   02/21/22 0900  ceFAZolin (ANCEF) IVPB 2g/100 mL premix        2 g 200 mL/hr over 30 Minutes Intravenous On call to O.R. 02/21/22 0804 02/21/22 1103      Culture/Microbiology    Component Value  Date/Time   SDES BLOOD RIGHT WRIST 10/06/2021 1738   SPECREQUEST  10/06/2021 1738    BOTTLES DRAWN AEROBIC AND  ANAEROBIC Blood Culture adequate volume   CULT  10/06/2021 1738    NO GROWTH 5 DAYS Performed at [facility], [address], [place], [place] [address]    REPTSTATUS 10/11/2021 FINAL 10/06/2021 1738    Other culture-see note  Radiology Studies: DG Knee Right Port  Result Date: 02/21/2022 CLINICAL DATA:  Fractured tibia EXAM: PORTABLE RIGHT KNEE - 1-2 VIEW COMPARISON:  02/19/2022 FINDINGS: There is interval internal fixation of comminuted fracture of proximal right tibia with metallic sideplate and multiple screws. There is previous placement of intramedullary rod in right femur. IMPRESSION: Internal fixation of comminuted fracture of proximal shaft of right tibia. Electronically Signed   By: Ernie Avena M.D.   On: 02/21/2022 14:01   DG FEMUR PORT MIN 2 VIEWS LEFT  Result Date: 02/21/2022 CLINICAL DATA:  Fracture left femur EXAM: LEFT FEMUR PORTABLE 2 VIEWS COMPARISON:  02/19/2022 FINDINGS: There is interval internal fixation of fracture of distal femur with metallic plate and multiple screws. There is previous internal fixation of fracture in the neck cough left femur with intramedullary rod with no interval change. IMPRESSION: Interval reduction and internal fixation of fracture of distal metaphyseal region of the left femur. Electronically Signed   By: Ernie Avena M.D.   On: 02/21/2022 13:59   DG Knee Complete 4 Views Right  Result Date: 02/21/2022 CLINICAL DATA:  Open reduction and internal fixation of proximal tibia EXAM: RIGHT KNEE - COMPLETE 4+ VIEW COMPARISON:  Tibia/fibula radiographs 02/19/2022 FINDINGS: Five C-arm fluoroscopic images were obtained intraoperatively and submitted for post operative interpretation. Postsurgical changes reflecting sideplate and screw fixation of the proximal tibia are noted. Fracture alignment is similar to the preoperative study. Hardware alignment is within expected limits, without evidence of immediate complication. Fluoro time 2  minutes 35 seconds, dose 4.25 mGy. Please see the performing provider's procedural report for further detail. IMPRESSION: Intraoperative images during surgical fixation of the proximal tibial fracture as above. Electronically Signed   By: Lesia Hausen M.D.   On: 02/21/2022 13:22   DG FEMUR MIN 2 VIEWS LEFT  Result Date: 02/21/2022 CLINICAL DATA:  Left femoral ORIF EXAM: LEFT FEMUR 2 VIEWS COMPARISON:  02/19/2022 FINDINGS: Five C-arm fluoroscopic images were obtained intraoperatively and submitted for post operative interpretation. Images obtained during ORIF of distal left femoral fracture with lateral sideplate and screw fixation construct. 2 minutes 35 seconds fluoroscopy time utilized. Radiation dose: 4.25 mGy. Please see the performing provider's procedural report for further detail. IMPRESSION: ORIF distal left femoral fracture. Electronically Signed   By: Duanne Guess D.O.   On: 02/21/2022 13:19   DG C-Arm 1-60 Min-No Report  Result Date: 02/21/2022 Fluoroscopy was utilized by the requesting physician.  No radiographic interpretation.   DG C-Arm 1-60 Min-No Report  Result Date: 02/21/2022 Fluoroscopy was utilized by the requesting physician.  No radiographic interpretation.   CT KNEE RIGHT WO CONTRAST  Result Date: 02/20/2022 CLINICAL DATA:  Right knee pain.  Multiple recent falls. EXAM: CT OF THE RIGHT KNEE WITHOUT CONTRAST TECHNIQUE: Multidetector CT imaging of the right knee was performed according to the standard protocol. Multiplanar CT image reconstructions were also generated. RADIATION DOSE REDUCTION: This exam was performed according to the departmental dose-optimization program which includes automated exposure control, adjustment of the mA and/or kV according to patient size and/or use of iterative reconstruction technique. COMPARISON:  Right knee x-rays from yesterday. FINDINGS: Bones/Joint/Cartilage Acute minimally impacted fracture of the proximal tibial metadiaphysis with  longitudinal intra-articular extension through the medial tibial spine. Acute mildly impacted fracture of the fibular head and neck. Partially visualized distal femoral intramedullary rod with fracture of the interlocking screw. Joint spaces are preserved. Small lipohemarthrosis. Severe osteopenia. Ligaments Ligaments are suboptimally evaluated by CT. Muscles and Tendons Grossly intact. Soft tissue No fluid collection or hematoma.  No soft tissue mass. IMPRESSION: 1. Acute minimally impacted fracture of the proximal tibial metadiaphysis with longitudinal intra-articular extension through the medial tibial spine. 2. Acute mildly impacted fracture of the fibular head and neck. 3. Small lipohemarthrosis. 4. Partially visualized distal femoral intramedullary rod with fracture of the interlocking screw. Electronically Signed   By: Titus Dubin M.D.   On: 02/20/2022 10:34   CT KNEE LEFT WO CONTRAST  Result Date: 02/20/2022 CLINICAL DATA:  Left knee pain after several falls over the last few days. EXAM: CT OF THE LEFT KNEE WITHOUT CONTRAST TECHNIQUE: Multidetector CT imaging of the left knee was performed according to the standard protocol. Multiplanar CT image reconstructions were also generated. RADIATION DOSE REDUCTION: This exam was performed according to the departmental dose-optimization program which includes automated exposure control, adjustment of the mA and/or kV according to patient size and/or use of iterative reconstruction technique. COMPARISON:  Left knee x-rays from yesterday. FINDINGS: Bones/Joint/Cartilage Acute minimally impacted transverse intra-articular fracture of the distal femoral metaphysis, also involving the medial epicondyle. Partially visualized femoral intramedullary nail and distal interlocking screws, which do not involve the fracture. No dislocation. Joint spaces are preserved. Large lipohemarthrosis. Severe osteopenia. Ligaments Ligaments are suboptimally evaluated by CT. Muscles  and Tendons Grossly intact. Soft tissue No fluid collection or hematoma.  No soft tissue mass. IMPRESSION: 1. Acute minimally impacted transverse intra-articular fracture of the distal femoral metaphysis. 2. Large lipohemarthrosis. Electronically Signed   By: Titus Dubin M.D.   On: 02/20/2022 10:28     LOS: 1 day   Antonieta Pert, MD [facility]  02/22/2022, 8:21 AM

## 2022-02-22 NOTE — Progress Notes (Signed)
Subjective: Patient reports pain as moderate. Worse with movement. Trying to bend his knees while in bed. Tolerating diet.  Urinating.   No CP, SOB. Hasn't mobilized OOB with PT/OT yet.  Objective:   VITALS:   Vitals:   02/21/22 1825 02/21/22 2144 02/22/22 0736 02/22/22 0740  BP: 93/75 101/70  115/80  Pulse: (!) 118 (!) 108  97  Resp: 17 18  16   Temp: 98.5 F (36.9 C) 97.6 F (36.4 C)  (!) 97.5 F (36.4 C)  TempSrc: Oral Oral  Oral  SpO2: 94% 95% 96% 100%  Weight:      Height:          Latest Ref Rng & Units 02/22/2022    4:24 AM 02/21/2022    7:03 AM 02/20/2022    5:17 AM  CBC  WBC 4.0 - 10.5 K/uL 7.5  6.5  6.6   Hemoglobin 13.0 - 17.0 g/dL 7.7  8.3  8.6   Hematocrit 39.0 - 52.0 % 23.7  24.6  24.6   Platelets 150 - 400 K/uL 124  121  103       Latest Ref Rng & Units 02/22/2022    4:24 AM 02/21/2022    7:03 AM 02/20/2022    5:17 AM  BMP  Glucose 70 - 99 mg/dL 02/22/2022  096  283   BUN 8 - 23 mg/dL 9  8  8    Creatinine 0.61 - 1.24 mg/dL 662   9.47   Sodium 135 - 145 mmol/L 135  133  129   Potassium 3.5 - 5.1 mmol/L 4.2  4.1  4.3   Chloride 98 - 111 mmol/L 101  102  99   CO2 22 - 32 mmol/L 27  29  23    Calcium 8.9 - 10.3 mg/dL 8.1  7.6  7.4    Intake/Output      08/25 0701 08/26 0700 08/26 0701 08/27 0700   P.O. 240    I.V. (mL/kg) 1595.8 (22.4)    IV Piggyback 200    Total Intake(mL/kg) 2035.8 (28.6)    Urine (mL/kg/hr) 800 (0.5)    Blood 125    Total Output 925    Net +1110.8            Physical Exam: General: NAD.  Laying in bed, calm, comfortable Resp: No increased wob Cardio: regular rate and rhythm ABD soft Neurologically intact MSK B/L Neurovascularly intact  Sensation intact distally Intact pulses distally Dorsiflexion/Plantar flexion intact Incision: dressing C/D/I   Assessment: 1 Day Post-Op  S/P Procedure(s) (LRB): OPEN REDUCTION INTERNAL FIXATION  OF LEFT DISTAL FEMUR (Left) OPEN REDUCTION INTERNAL FIXATION OF RIGHT PROXIMAL  TIBIAL (Right) by Dr. 9/26 on 02/21/22  Principal Problem:   Closed fracture of left distal femur (HCC) Active Problems:   Bipolar disorder (HCC)   Alcohol abuse   COPD (chronic obstructive pulmonary disease) (HCC)   Thrombocytopenia (HCC)   Macrocytic anemia   Closed fracture of right proximal tibia   Hyponatremia   Prolonged QT interval   Closed fracture of distal end of left femur (HCC)    Plan:  Advance diet Up with therapy Incentive Spirometry Elevate and Apply ice  Weightbearing: WBAT LLE, WB for transfers only RLE Insicional and dressing care: Dressings left intact until follow-up and Reinforce dressings as needed Orthopedic device(s): None Showering: Keep dressing dry VTE prophylaxis: Lovenox 40mg  qd , SCDs, ambulation Pain control: continue current regimen Follow - up plan:  with [facility] Contact information for  today:  Margarita Rana MD, Levester Fresh PA-C  Dispo:  TBD based on PT/OT evals     Marzetta Board Office [phone] 02/22/2022, 7:59 AM

## 2022-02-23 DIAGNOSIS — S72402A Unspecified fracture of lower end of left femur, initial encounter for closed fracture: Secondary | ICD-10-CM | POA: Diagnosis not present

## 2022-02-23 LAB — CBC
HCT: 18.8 % — ABNORMAL LOW (ref 39.0–52.0)
Hemoglobin: 6.4 g/dL — CL (ref 13.0–17.0)
MCH: 36.4 pg — ABNORMAL HIGH (ref 26.0–34.0)
MCHC: 34 g/dL (ref 30.0–36.0)
MCV: 106.8 fL — ABNORMAL HIGH (ref 80.0–100.0)
Platelets: 114 10*3/uL — ABNORMAL LOW (ref 150–400)
RBC: 1.76 MIL/uL — ABNORMAL LOW (ref 4.22–5.81)
RDW: 12.5 % (ref 11.5–15.5)
WBC: 7.9 10*3/uL (ref 4.0–10.5)
nRBC: 0 % (ref 0.0–0.2)

## 2022-02-23 LAB — BASIC METABOLIC PANEL
Anion gap: 9 (ref 5–15)
BUN: 11 mg/dL (ref 8–23)
CO2: 27 mmol/L (ref 22–32)
Calcium: 8 mg/dL — ABNORMAL LOW (ref 8.9–10.3)
Chloride: 99 mmol/L (ref 98–111)
Creatinine, Ser: 0.64 mg/dL (ref 0.61–1.24)
GFR, Estimated: >60 mL/min (ref >60)
Glucose, Bld: 108 mg/dL — ABNORMAL HIGH (ref 70–99)
Potassium: 4.1 mmol/L (ref 3.5–5.1)
Sodium: 135 mmol/L (ref 135–145)

## 2022-02-23 LAB — RETICULOCYTES
Immature Retic Fract: 25.8 % — ABNORMAL HIGH (ref 2.3–15.9)
RBC.: 1.76 MIL/uL — ABNORMAL LOW (ref 4.22–5.81)
Retic Count, Absolute: 58.6 10*3/uL (ref 19.0–186.0)
Retic Ct Pct: 3.4 % — ABNORMAL HIGH (ref 0.4–3.1)

## 2022-02-23 LAB — FERRITIN: Ferritin: 174 ng/mL (ref 24–336)

## 2022-02-23 LAB — IRON AND TIBC
Iron: 31 ug/dL — ABNORMAL LOW (ref 45–182)
Saturation Ratios: 17 % — ABNORMAL LOW (ref 17.9–39.5)
TIBC: 183 ug/dL — ABNORMAL LOW (ref 250–450)
UIBC: 152 ug/dL

## 2022-02-23 LAB — HEMOGLOBIN AND HEMATOCRIT, BLOOD
HCT: 23 % — ABNORMAL LOW (ref 39.0–52.0)
Hemoglobin: 8 g/dL — ABNORMAL LOW (ref 13.0–17.0)

## 2022-02-23 LAB — PREPARE RBC (CROSSMATCH)

## 2022-02-23 LAB — VITAMIN B12: Vitamin B-12: 354 pg/mL (ref 180–914)

## 2022-02-23 LAB — FOLATE: Folate: 14.9 ng/mL (ref >5.9)

## 2022-02-23 MED ORDER — SODIUM CHLORIDE 0.9 % IV BOLUS
500.0000 mL | Freq: Once | INTRAVENOUS | Status: AC
  Administered 2022-02-23: 500 mL via INTRAVENOUS

## 2022-02-23 MED ORDER — KETOROLAC TROMETHAMINE 15 MG/ML IJ SOLN
15.0000 mg | Freq: Four times a day (QID) | INTRAMUSCULAR | Status: AC | PRN
  Administered 2022-02-23: 15 mg via INTRAVENOUS
  Filled 2022-02-23: qty 1

## 2022-02-23 MED ORDER — SODIUM CHLORIDE 0.9% IV SOLUTION: Freq: Once | INTRAVENOUS | Status: AC

## 2022-02-23 NOTE — Progress Notes (Signed)
   02/23/22 1525  Assess: MEWS Score  Temp 98.3 F (36.8 C)  BP 90/60  Pulse Rate (!) 120  Resp 16  Level of Consciousness Alert  SpO2 93 %  O2 Device Room Air  Patient Activity (if Appropriate) In bed  Assess: MEWS Score  MEWS Temp 0  MEWS Systolic 1  MEWS Pulse 2  MEWS RR 0  MEWS LOC 0  MEWS Score 3  MEWS Score Color Yellow  Assess: if the MEWS score is Yellow or Red  Were vital signs taken at a resting state? Yes  Focused Assessment Change from prior assessment (see assessment flowsheet)  Does the patient meet 2 or more of the SIRS criteria? Yes  Does the patient have a confirmed or suspected source of infection? No  Provider and Rapid Response Notified?  (will notifiy Dr. Dayna Barker)  MEWS guidelines implemented *See Row Information* Yes  Treat  MEWS Interventions Escalated (See documentation below)  Pain Scale 0-10  Pain Score 5  Take Vital Signs  Increase Vital Sign Frequency  Yellow: Q 2hr X 2 then Q 4hr X 2, if remains yellow, continue Q 4hrs  Escalate  MEWS: Escalate Yellow: discuss with charge nurse/RN and consider discussing with provider and RRT  Notify: Charge Nurse/RN  Name of Charge Nurse/RN Notified Chelsie, RN  Date Charge Nurse/RN Notified 02/23/22  Time Charge Nurse/RN Notified 1525  Notify: Provider  Provider Name/Title Dr. Lanae Boast  Date Provider Notified 02/23/22  Time Provider Notified 1525  Method of Notification  (secure chat)  Notification Reason Other (Comment) (Yellow MEWS)  Assess: SIRS CRITERIA  SIRS Temperature  0  SIRS Pulse 1  SIRS Respirations  0  SIRS WBC 1  SIRS Score Sum  2   Spoke with Dr. Lanae Boast via secure chat and made MD aware that patient's blood is finished infusing and that he is now a yellow MEWS because his heart rate is 120 and SIRS score of 2. Made MD aware that patient's blood pressure is also 90/60 and that patient is asymptomatic, continues to c/o pain to his legs and was given pain meds about an hour ago. MD  gave order to check H&H in two hours and to not give any IV pain meds. Patient alert watching tv with no complaints other than pain to legs at this time. Will Reposition and medicate for pain as blood pressure allows.

## 2022-02-23 NOTE — Progress Notes (Signed)
HOSPITAL MEDICINE OVERNIGHT EVENT NOTE    Notified by nursing that patient's hemoglobin is 6.4 this morning.  Patient is not exhibiting any clinical evidence of active bleeding.  Patient denies shortness of breath or chest pain or lightheadedness.  Patient is hemodynamically stable with no change in blood pressures.  1 unit packed red blood cell transfusion ordered however when nursing discussed consent for transfusion with patient he has declined stating he wants to discuss "other options" prior to proceeding.  Day provider notified who will follow up with the patient and discuss further.  Marinda Elk  MD [facility]

## 2022-02-23 NOTE — Progress Notes (Addendum)
Spoke with Dr. Jonathon Bellows again and MD ordered NS bolus and ordered IV toradol and stated okay to give IV Toradol now.

## 2022-02-23 NOTE — Progress Notes (Addendum)
Patient's hemoglobin resulted at 6.4 this morning. Physician notified and orders placed for blood transfusion. Patient does not want to sign consent for blood transfusion just yet, he wants to discuss with the physicians alternatives to treating the low hemoglobin.Physician on call notified.

## 2022-02-23 NOTE — Progress Notes (Addendum)
Inpatient Rehab Admissions:  Inpatient Rehab Consult received.  I met with placed at the bedside for rehabilitation assessment and to discuss goals and expectations of an inpatient rehab admission.  Pt acknowledged understanding. Pt interested in pursuing CIR. However concerned about possible DSS investigation involving girlfriend Levada Dy. He told AC she is the only person he knows who will take care of him. He gave [facility] permission to contact [place]. Spoke with Levada Dy on the telephone. She acknowledged understanding of CIR goals and expectations. She feels CIR would be good for the pt. However, she is also concerned about a possible DSS investigation. She also mentioned to [facility] that even if there is no investigation, that she doesn't feel she would be able to continue to take care of pt at their home. She doesn't feel it would be safe for him to return to their home. TOC made aware.  Signed: Gayland Curry, [place], [place] Admissions Coordinator [phone]

## 2022-02-23 NOTE — Progress Notes (Signed)
Subjective: 2 Days Post-Op Procedure(s) (LRB): OPEN REDUCTION INTERNAL FIXATION  OF LEFT DISTAL FEMUR (Left) OPEN REDUCTION INTERNAL FIXATION OF RIGHT PROXIMAL TIBIAL (Right) Patient reports pain as mild and moderate.    Objective: Vital signs in last 24 hours: Temp:  [97.8 F (36.6 C)-98.8 F (37.1 C)] 97.8 F (36.6 C) (08/27 0736) Pulse Rate:  [105-118] 108 (08/27 0736) Resp:  [16-18] 16 (08/27 0736) BP: (107-113)/(64-79) 110/79 (08/27 0736) SpO2:  [95 %-100 %] 95 % (08/27 0741)  Intake/Output from previous day: 08/26 0701 - 08/27 0700 In: 360 [P.O.:360] Out: 2050 [Urine:2050] Intake/Output this shift: Total I/O In: 120 [P.O.:120] Out: 850 [Urine:850]  Recent Labs    02/21/22 0703 02/22/22 0424 02/23/22 0143  HGB 8.3* 7.7* 6.4*   Recent Labs    02/22/22 0424 02/23/22 0143  WBC 7.5 7.9  RBC 2.18* 1.76*  HCT 23.7* 18.8*  PLT 124* 114*   Recent Labs    02/22/22 0424 02/23/22 0143  NA 135 135  K 4.2 4.1  CL 101 99  CO2 27 27  BUN 9 11  CREATININE 0.72 0.64  GLUCOSE 140* 108*  CALCIUM 8.1* 8.0*   No results for input(s): "LABPT", "INR" in the last 72 hours.  Neurovascular intact Sensation intact distally Intact pulses distally Dorsiflexion/Plantar flexion intact Incision: dressing C/D/I Compartment soft   Assessment/Plan: 2 Days Post-Op Procedure(s) (LRB): OPEN REDUCTION INTERNAL FIXATION  OF LEFT DISTAL FEMUR (Left) OPEN REDUCTION INTERNAL FIXATION OF RIGHT PROXIMAL TIBIAL (Right) Up with therapy WBAT LLE WB for transfers only RLE Eval for CIR upcoming Pain control as ordered Plan for blood transfusion today Hgb 6.4 Lovenox dvt proph CIWA protocol in place     [name] 02/23/2022, 9:22 AM

## 2022-02-23 NOTE — Progress Notes (Signed)
PROGRESS NOTE Perry Bullock  BZJ:[id number] DOB: March 21, 1961 DOA: 02/21/2022 PCP: [facility]   Brief Narrative/Hospital Course: 61 y.o.m w/ history significant for bipolar disorder, COPD on nocturnal oxygen,alcohol abuse, and gout who has been transferred from [facility] for evaluation of bilateral lower extremity fractures by orthopedic trauma specialist. He normally ambulates with a walker, was experiencing some pain and swelling in his foot that he attributed to a gout flare, and was having some difficulty ambulating due to this.  He reports stumbling and falling onto his knees, experiencing severe bilateral knee pain, and was unable to stand up on his own. [facility] ED and Hospital Course: Upon arrival to the ED, patient is found to be afebrile and saturating well on room air with slight tachycardia and stable blood pressure.  EKG was notable for RBBB and QTc of 500 ms. He was treated with prednisone and colchicine in the ED, was still unable to ambulate, and was admitted to the hospital.  Orthopedic surgery was consulted, plain radiographs of the bilateral knees were obtained, and bilateral fractures were noted.  These were further assessed with CT, demonstrating acute intra-articular fracture of the distal left femur with large lipohemarthrosis, as well as acute fracture of the right proximal tibia with intra-articular extension, and acute fibular head and neck fractures.Ortho Dr Emeline Gins [facility] discussed the case with orthopedic trauma specialist at [facility] Dr Carola Frost and transferred for evaluation. Patient underwent ORIF of right tibial plateau fracture, left distal femur fracture and removal of hardware from left femur by Dr Jena Gauss 02/21/22    Subjective: Seen and examined this morning.  Complains of pain in the surgery site on both legs Overnight afebrile BP stable,H&H has dropped to 6.4 g -1 unit PRBC ordered-but was reluctant and wanted to discuss with MD.   Assessment and  Plan: Principal Problem:   Closed fracture of left distal femur (HCC) Active Problems:   Alcohol abuse   Thrombocytopenia (HCC)   Bipolar disorder (HCC)   COPD (chronic obstructive pulmonary disease) (HCC)   Macrocytic anemia   Closed fracture of right proximal tibia   Hyponatremia   Prolonged QT interval   Closed fracture of distal end of left femur (HCC)   Distal left femur fracture  Closed fracture of right proximal tibia Status post fall: S/p ORIF of right tibial plateau fracture, left distal femur fracture and removal of hardware from left femur by Dr Jena Gauss 02/21/22.  Continue DVT prophylaxis with Lovenox, pain control with oral opiates, PT OT, inpatient rehab has been referred for further disposition  Acute blood loss anemia in the setting of hip fracture/surgery Chronic microcytic anemia: Hemoglobin 6.4 g, patient agrees for blood transfusion after explaining risks benefits and alternatives.  Check B12 folate.  Check posttransfusional h/h in am. Recent Labs  Lab 02/19/22 0911 02/20/22 0517 02/21/22 0703 02/22/22 0424 02/23/22 0143  HGB 11.7* 8.6* 8.3* 7.7* 6.4*  HCT 33.8* 24.6* 24.6* 23.7* 18.8*   COPD-not in exacerbation continue Dulera Spiriva.  Chronic hypoxic respiratory failure on nocturnal oxygen, continue the same.  Encourage incentive spirometry. PTOT and mobilize  Hyponatremia: Likely some hypovolemia: Improved.  Prolonged QT interval-minimize QT prolonging medication. Alcohol abuse: Watch for withdrawals continue CIWA Ativan, supplement/vitamin. TOC consult Thrombocytopenia-monitor platelet count, stable Bipolar disorder: Mood stable on Seroquel trazodone and Ambien  DVT prophylaxis: enoxaparin (LOVENOX) injection 40 mg Start: 02/22/22 0800 SCDs Start: 02/21/22 1416 SCDs Start: 02/21/22 0428 Code Status:   Code Status: Full Code Family Communication: plan of care discussed  with patient at bedside. Patient status is: Inpatient because of multiple  fractures Level of care: Med-Surg   Dispo: The patient is from: home            Anticipated disposition:  CIR 1 day once hemoglobin is stabilized and patient accepted  Mobility Assessment (last 72 hours)     Mobility Assessment     Row Name 02/23/22 0753 02/22/22 1925 02/22/22 1352 02/22/22 1200 02/21/22 2048   Does patient have an order for bedrest or is patient medically unstable No - Continue assessment No - Continue assessment -- -- No - Continue assessment   What is the highest level of mobility based on the progressive mobility assessment? Level 1 (Bedfast) - Unable to balance while sitting on edge of bed Level 1 (Bedfast) - Unable to balance while sitting on edge of bed Level 1 (Bedfast) - Unable to balance while sitting on edge of bed Level 3 (Stands with assist) - Balance while standing  and cannot march in place Level 2 (Chairfast) - Balance while sitting on edge of bed and cannot stand   Is the above level different from baseline mobility prior to current illness? Yes - Recommend PT order Yes - Recommend PT order -- -- Yes - Recommend PT order    [place] Name 02/21/22 0240           Does patient have an order for bedrest or is patient medically unstable Yes- Bedfast (Level 1) - Complete                 Objective: Vitals last 24 hrs: Vitals:   02/23/22 0009 02/23/22 0405 02/23/22 0736 02/23/22 0741  BP: 113/64 107/72 110/79   Pulse: (!) 105 (!) 105 (!) 108   Resp: 16 16 16    Temp: 98 F (36.7 C) 98.7 F (37.1 C) 97.8 F (36.6 C)   TempSrc: Oral Oral Oral   SpO2: 96% 99% 98% 95%  Weight:      Height:       Weight change:   Physical Examination: General exam: AAox3, older than stated age, weak appearing. HEENT:Oral mucosa moist, Ear/Nose WNL grossly, dentition normal. Respiratory system: bilaterally clear no use of accessory muscle Cardiovascular system: S1 & S2 +, No JVD,. Gastrointestinal system: Abdomen soft,NT,ND,BS+ Nervous System:Alert, awake, moving  extremities and grossly nonfocal Extremities: LE ankle edema neg, ACE wraps in b/l LE Skin: No rashes,no icterus. MSK: Normal muscle bulk,tone, power   Medications reviewed:  Scheduled Meds:  sodium chloride   Intravenous Once   acetaminophen  650 mg Oral Q6H   allopurinol  100 mg Oral Daily   docusate sodium  100 mg Oral BID   enoxaparin (LOVENOX) injection  40 mg Subcutaneous Q24H   feeding supplement  237 mL Oral BID BM   folic acid  1 mg Oral Daily   gabapentin  300 mg Oral TID   latanoprost  1 drop Both Eyes QHS   LORazepam  0-4 mg Intravenous Q12H   mometasone-formoterol  2 puff Inhalation BID   multivitamin with minerals  1 tablet Oral Daily   nicotine  21 mg Transdermal Daily   pantoprazole  40 mg Oral BID   QUEtiapine  150 mg Oral BID   sucralfate  1 g Oral TID AC & HS   thiamine  100 mg Oral Daily   Or   thiamine  100 mg Intravenous Daily   umeclidinium bromide  1 puff Inhalation Daily   zolpidem  10 mg  Oral QHS   Continuous Infusions:  sodium chloride 50 mL/hr at 02/21/22 1505   methocarbamol (ROBAXIN) IV        Diet Order             Diet Heart Room service appropriate? Yes; Fluid consistency: Thin  Diet effective now                    Nutrition Problem: Increased nutrient needs Etiology: post-op healing Signs/Symptoms: estimated needs Interventions: Ensure Enlive (each supplement provides 350kcal and 20 grams of protein), MVI   Intake/Output Summary (Last 24 hours) at 02/23/2022 0933 Last data filed at 02/23/2022 0835 Gross per 24 hour  Intake 480 ml  Output 2900 ml  Net -2420 ml   Net IO Since Admission: -1,307.63 mL [02/23/22 0933]  Wt Readings from Last 3 Encounters:  02/21/22 71.2 kg  02/19/22 70.3 kg  10/23/21 70.3 kg     Unresulted Labs (From admission, onward)     Start     Ordered   02/28/22 0500  Creatinine, serum  (enoxaparin (LOVENOX)    CrCl >/= 30 ml/min)  Weekly,   R     Comments: while on enoxaparin therapy     02/21/22 1415          Data Reviewed: I have personally reviewed following labs and imaging studies CBC: Recent Labs  Lab 02/19/22 0911 02/20/22 0517 02/21/22 0703 02/22/22 0424 02/23/22 0143  WBC 10.8* 6.6 6.5 7.5 7.9  NEUTROABS 8.5*  --   --   --   --   HGB 11.7* 8.6* 8.3* 7.7* 6.4*  HCT 33.8* 24.6* 24.6* 23.7* 18.8*  MCV 102.1* 100.0 106.5* 108.7* 106.8*  PLT 169 103* 121* 124* 114*   Basic Metabolic Panel: Recent Labs  Lab 02/19/22 0911 02/20/22 0517 02/21/22 0703 02/22/22 0424 02/23/22 0143  NA 127* 129* 133* 135 135  K 4.6 4.3 4.1 4.2 4.1  CL 95* 99 102 101 99  CO2 17* 23 29 27 27   GLUCOSE 76 191* 101* 140* 108*  BUN <5* 8 8 9 11   CREATININE 0.38* 0.43* 0.51* 0.72 0.64  CALCIUM 7.7* 7.4* 7.6* 8.1* 8.0*   GFR: Estimated Creatinine Clearance: 97.7 mL/min (by C-G formula based on SCr of 0.64 mg/dL). Liver Function Tests: Recent Labs  Lab 02/19/22 0911 02/21/22 0703  AST 54* 33  ALT 38 28  ALKPHOS 103 78  BILITOT 0.9 0.7  PROT 5.6* 4.5*  ALBUMIN 2.8* 2.3*   No results for input(s): "LIPASE", "AMYLASE" in the last 168 hours. No results for input(s): "AMMONIA" in the last 168 hours. Coagulation Profile: No results for input(s): "INR", "PROTIME" in the last 168 hours. BNP (last 3 results) No results for input(s): "PROBNP" in the last 8760 hours. HbA1C: No results for input(s): "HGBA1C" in the last 72 hours. CBG: No results for input(s): "GLUCAP" in the last 168 hours. Lipid Profile: No results for input(s): "CHOL", "HDL", "LDLCALC", "TRIG", "CHOLHDL", "LDLDIRECT" in the last 72 hours. Thyroid Function Tests: No results for input(s): "TSH", "T4TOTAL", "FREET4", "T3FREE", "THYROIDAB" in the last 72 hours. Sepsis Labs: No results for input(s): "PROCALCITON", "LATICACIDVEN" in the last 168 hours.  No results found for this or any previous visit (from the past 240 hour(s)).  Antimicrobials: Anti-infectives (From admission, onward)    Start      Dose/Rate Route Frequency Ordered Stop   02/21/22 1845  ceFAZolin (ANCEF) IVPB 2g/100 mL premix        2 g 200  mL/hr over 30 Minutes Intravenous Every 8 hours 02/21/22 1415 02/22/22 0956   02/21/22 1140  vancomycin (VANCOCIN) powder  Status:  Discontinued          As needed 02/21/22 1140 02/21/22 1303   02/21/22 0900  ceFAZolin (ANCEF) IVPB 2g/100 mL premix        2 g 200 mL/hr over 30 Minutes Intravenous On call to O.R. 02/21/22 0804 02/21/22 1103      Culture/Microbiology    Component Value Date/Time   SDES BLOOD RIGHT WRIST 10/06/2021 1738   SPECREQUEST  10/06/2021 1738    BOTTLES DRAWN AEROBIC AND ANAEROBIC Blood Culture adequate volume   CULT  10/06/2021 1738    NO GROWTH 5 DAYS Performed at [facility], [place] [address], [place], [place] [address]    REPTSTATUS 10/11/2021 FINAL 10/06/2021 1738    Other culture-see note  Radiology Studies: DG Knee Right Port  Result Date: 02/21/2022 CLINICAL DATA:  Fractured tibia EXAM: PORTABLE RIGHT KNEE - 1-2 VIEW COMPARISON:  02/19/2022 FINDINGS: There is interval internal fixation of comminuted fracture of proximal right tibia with metallic sideplate and multiple screws. There is previous placement of intramedullary rod in right femur. IMPRESSION: Internal fixation of comminuted fracture of proximal shaft of right tibia. Electronically Signed   By: Elmer Picker M.D.   On: 02/21/2022 14:01   DG FEMUR PORT MIN 2 VIEWS LEFT  Result Date: 02/21/2022 CLINICAL DATA:  Fracture left femur EXAM: LEFT FEMUR PORTABLE 2 VIEWS COMPARISON:  02/19/2022 FINDINGS: There is interval internal fixation of fracture of distal femur with metallic plate and multiple screws. There is previous internal fixation of fracture in the neck cough left femur with intramedullary rod with no interval change. IMPRESSION: Interval reduction and internal fixation of fracture of distal metaphyseal region of the left femur. Electronically Signed   By: Elmer Picker M.D.   On: 02/21/2022 13:59   DG Knee Complete 4 Views Right  Result Date: 02/21/2022 CLINICAL DATA:  Open reduction and internal fixation of proximal tibia EXAM: RIGHT KNEE - COMPLETE 4+ VIEW COMPARISON:  Tibia/fibula radiographs 02/19/2022 FINDINGS: Five C-arm fluoroscopic images were obtained intraoperatively and submitted for post operative interpretation. Postsurgical changes reflecting sideplate and screw fixation of the proximal tibia are noted. Fracture alignment is similar to the preoperative study. Hardware alignment is within expected limits, without evidence of immediate complication. Fluoro time 2 minutes 35 seconds, dose 4.25 mGy. Please see the performing provider's procedural report for further detail. IMPRESSION: Intraoperative images during surgical fixation of the proximal tibial fracture as above. Electronically Signed   By: Valetta Mole M.D.   On: 02/21/2022 13:22   DG FEMUR MIN 2 VIEWS LEFT  Result Date: 02/21/2022 CLINICAL DATA:  Left femoral ORIF EXAM: LEFT FEMUR 2 VIEWS COMPARISON:  02/19/2022 FINDINGS: Five C-arm fluoroscopic images were obtained intraoperatively and submitted for post operative interpretation. Images obtained during ORIF of distal left femoral fracture with lateral sideplate and screw fixation construct. 2 minutes 35 seconds fluoroscopy time utilized. Radiation dose: 4.25 mGy. Please see the performing provider's procedural report for further detail. IMPRESSION: ORIF distal left femoral fracture. Electronically Signed   By: Davina Poke D.O.   On: 02/21/2022 13:19   DG C-Arm 1-60 Min-No Report  Result Date: 02/21/2022 Fluoroscopy was utilized by the requesting physician.  No radiographic interpretation.   DG C-Arm 1-60 Min-No Report  Result Date: 02/21/2022 Fluoroscopy was utilized by the requesting physician.  No radiographic interpretation.  LOS: 2 days   Antonieta Pert, MD [facility]  02/23/2022, 9:33 AM

## 2022-02-24 ENCOUNTER — Encounter (HOSPITAL_COMMUNITY): Payer: Self-pay | Admitting: Student

## 2022-02-24 DIAGNOSIS — S72402A Unspecified fracture of lower end of left femur, initial encounter for closed fracture: Secondary | ICD-10-CM | POA: Diagnosis not present

## 2022-02-24 LAB — BPAM RBC
Blood Product Expiration Date: 202309062359
ISSUE DATE / TIME: 202308271210
Unit Type and Rh: 7300

## 2022-02-24 LAB — CBC
HCT: 25.3 % — ABNORMAL LOW (ref 39.0–52.0)
Hemoglobin: 8.6 g/dL — ABNORMAL LOW (ref 13.0–17.0)
MCH: 35.1 pg — ABNORMAL HIGH (ref 26.0–34.0)
MCHC: 34 g/dL (ref 30.0–36.0)
MCV: 103.3 fL — ABNORMAL HIGH (ref 80.0–100.0)
Platelets: 153 10*3/uL (ref 150–400)
RBC: 2.45 MIL/uL — ABNORMAL LOW (ref 4.22–5.81)
RDW: 15.6 % — ABNORMAL HIGH (ref 11.5–15.5)
WBC: 8.7 10*3/uL (ref 4.0–10.5)
nRBC: 0 % (ref 0.0–0.2)

## 2022-02-24 LAB — TYPE AND SCREEN
ABO/RH(D): B POS
Antibody Screen: NEGATIVE
Unit division: 0

## 2022-02-24 LAB — BASIC METABOLIC PANEL
Anion gap: 6 (ref 5–15)
BUN: 13 mg/dL (ref 8–23)
CO2: 26 mmol/L (ref 22–32)
Calcium: 8.2 mg/dL — ABNORMAL LOW (ref 8.9–10.3)
Chloride: 103 mmol/L (ref 98–111)
Creatinine, Ser: 0.74 mg/dL (ref 0.61–1.24)
GFR, Estimated: >60 mL/min (ref >60)
Glucose, Bld: 105 mg/dL — ABNORMAL HIGH (ref 70–99)
Potassium: 4.3 mmol/L (ref 3.5–5.1)
Sodium: 135 mmol/L (ref 135–145)

## 2022-02-24 LAB — VITAMIN D 25 HYDROXY (VIT D DEFICIENCY, FRACTURES): Vit D, 25-Hydroxy: 21.19 ng/mL — ABNORMAL LOW (ref 30–100)

## 2022-02-24 MED ORDER — VITAMIN D 25 MCG (1000 UNIT) PO TABS
1000.0000 [IU] | ORAL_TABLET | Freq: Every day | ORAL | Status: DC
  Administered 2022-02-24 – 2022-03-04 (×9): 1000 [IU] via ORAL
  Filled 2022-02-24 (×9): qty 1

## 2022-02-24 NOTE — Progress Notes (Signed)
Occupational Therapy Treatment Patient Details Name: Perry Bullock MRN: [id number] DOB: 03/09/1961 Today's Date: 02/24/2022   History of present illness 61 y.o.m admitted 8/25 for BIL LE fractures. S/p ORIF Lt distal femur, ORIF Rt proximal tibia. PHMx: bipolar disorder, COPD on nocturnal oxygen,alcohol abuse, and gout who has been transferred from [facility].   OT comments  This 61 yo male seen with PT today to see if we could progress him with transfers today. He was able to transfer bed to recliner with Mod A+2 sit<>stand from raised bed and VCs for moving LEs. Pt reports increased pain in both LEs (R>L) with weight bearing which would be expected. He will continue to benefit from acute OT with follow up now changed to SNF due to does not look feasible for him to go home at this point with girlfriend. We will continue to follow.   Recommendations for follow up therapy are one component of a multi-disciplinary discharge planning process, led by the attending physician.  Recommendations may be updated based on patient status, additional functional criteria and insurance authorization.    Follow Up Recommendations  Skilled nursing-short term rehab (<3 hours/day)    Assistance Recommended at Discharge Frequent or constant Supervision/Assistance  Patient can return home with the following  Help with stairs or ramp for entrance;Assist for transportation;A lot of help with walking and/or transfers;A lot of help with bathing/dressing/bathroom;Assistance with cooking/housework   Equipment Recommendations  None recommended by OT       Precautions / Restrictions Precautions Precautions: Fall Restrictions Weight Bearing Restrictions: Yes RLE Weight Bearing: Non weight bearing LLE Weight Bearing: Weight bearing as tolerated Other Position/Activity Restrictions: RLE can be WBAT for transfers only; NWB for ambulation       Mobility Bed Mobility Overal bed mobility: Needs Assistance Bed Mobility:  Supine to Sit     Supine to sit: Min assist, HOB elevated     General bed mobility comments: VCs for sequencing    Transfers Overall transfer level: Needs assistance Equipment used: Rolling walker (2 wheels) Transfers: Sit to/from Stand Sit to Stand: Mod assist, Max assist, +2 physical assistance           General transfer comment: Mod A +2 sit<>stand from raised bed with RW; Max A +2 sit<>stand from recliner with sara stedy. VCs for hand placement for both ADs     Balance Overall balance assessment: Needs assistance Sitting-balance support: No upper extremity supported, Feet supported Sitting balance-Leahy Scale: Good     Standing balance support: Bilateral upper extremity supported, Reliant on assistive device for balance Standing balance-Leahy Scale: Poor Standing balance comment: and A from therapist                           ADL either performed or assessed with clinical judgement   ADL Overall ADL's : Needs assistance/impaired                         Toilet Transfer: Moderate assistance;Stand-pivot;Rolling walker (2 wheels) Toilet Transfer Details (indicate cue type and reason): simulated bed (raised)>recliner next to bed Toileting- Clothing Manipulation and Hygiene: Total assistance Toileting - Clothing Manipulation Details (indicate cue type and reason): Mod A to maintain standing            Extremity/Trunk Assessment Upper Extremity Assessment Upper Extremity Assessment: Overall WFL for tasks assessed            Vision Baseline Vision/History: 2  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; cataracts and glaucoma both eyes) Patient Visual Report: No change from baseline            Cognition Arousal/Alertness: Awake/alert Behavior During Therapy: WFL for tasks assessed/performed Overall Cognitive Status: Within Functional Limits for tasks assessed                                                     Pertinent Vitals/  Pain       Pain Assessment Pain Assessment: Faces Faces Pain Scale: Hurts whole lot Pain Location: BIl LEs with movement and WB'ing Pain Descriptors / Indicators: Constant, Grimacing, Moaning (Cussing) Pain Intervention(s): Limited activity within patient's tolerance, Monitored during session, Premedicated before session, Repositioned         Frequency  Min 2X/week        Progress Toward Goals  OT Goals(current goals can now be found in the care plan section)  Progress towards OT goals: Progressing toward goals  Acute Rehab OT Goals Patient Stated Goal: for pain to get better OT Goal Formulation: With patient Time For Goal Achievement: 03/08/22 Potential to Achieve Goals: Good  Plan Discharge plan needs to be updated    Co-evaluation    PT/OT/SLP Co-Evaluation/Treatment: Yes Reason for Co-Treatment: For patient/therapist safety;To address functional/ADL transfers PT goals addressed during session: Mobility/safety with mobility;Balance;Proper use of DME;Strengthening/ROM OT goals addressed during session: Strengthening/ROM;ADL's and self-care      AM-PAC OT "6 Clicks" Daily Activity     Outcome Measure   Help from another person eating meals?: None Help from another person taking care of personal grooming?: A Little Help from another person toileting, which includes using toliet, bedpan, or urinal?: A Lot Help from another person bathing (including washing, rinsing, drying)?: A Lot Help from another person to put on and taking off regular upper body clothing?: A Little Help from another person to put on and taking off regular lower body clothing?: A Lot 6 Click Score: 16    End of Session Equipment Utilized During Treatment: Gait belt;Rolling walker (2 wheels) (sara stedy)  OT Visit Diagnosis: Other abnormalities of gait and mobility (R26.89);Muscle weakness (generalized) (M62.81);Pain Pain - Right/Left:  (both) Pain - part of body: Leg (R>L)   Activity  Tolerance Patient tolerated treatment well   Patient Left in chair;with call bell/phone within reach   Nurse Communication Mobility status;Need for lift equipment (with NT)        Time: 1610-9604 OT Time Calculation (min): 28 min  Charges: OT General Charges $OT Visit: 1 Visit OT Treatments $Self Care/Home Management : 8-22 mins  Ignacia Palma, OTR/L Acute Rehab Services [facility] [phone] Office [phone]    Evette Georges 02/24/2022, 11:33 AM
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
## 2022-02-24 NOTE — Progress Notes (Signed)
PROGRESS NOTE HAYS DUNNIGAN  KXF:[id number] DOB: [birth year] DOA: 02/21/2022 PCP: [facility]   Brief Narrative/Hospital Course: 61 y.o.m w/ history significant for bipolar disorder, COPD on nocturnal oxygen,alcohol abuse, and gout who has been transferred from [facility] for evaluation of bilateral lower extremity fractures by orthopedic trauma specialist. He normally ambulates with a walker, was experiencing some pain and swelling in his foot that he attributed to a gout flare, and was having some difficulty ambulating due to this.  He reports stumbling and falling onto his knees, experiencing severe bilateral knee pain, and was unable to stand up on his own. [facility] ED and Hospital Course: Upon arrival to the ED, patient is found to be afebrile and saturating well on room air with slight tachycardia and stable blood pressure.  EKG was notable for RBBB and QTc of 500 ms. He was treated with prednisone and colchicine in the ED, was still unable to ambulate, and was admitted to the hospital.  Orthopedic surgery was consulted, plain radiographs of the bilateral knees were obtained, and bilateral fractures were noted.  These were further assessed with CT, demonstrating acute intra-articular fracture of the distal left femur with large lipohemarthrosis, as well as acute fracture of the right proximal tibia with intra-articular extension, and acute fibular head and neck fractures.Ortho Dr Emeline Gins [facility] discussed the case with orthopedic trauma specialist at [facility] Dr Carola Frost and transferred for evaluation. Patient underwent ORIF of right tibial plateau fracture, left distal femur fracture and removal of hardware from left femur by Dr Jena Gauss 02/21/22.  Hemoglobin dropped due to blood loss anemia needing 1 unit PRBC transfusion 8/27.  PT OT recommending CIR    Subjective: Seen and examined this morning. Still having pain and with mild tachycardia Hemoglobin has improved Overnight afebrile No  other new complaints Assessment and Plan: Principal Problem:   Closed fracture of left distal femur (HCC) Active Problems:   Alcohol abuse   Thrombocytopenia (HCC)   Bipolar disorder (HCC)   COPD (chronic obstructive pulmonary disease) (HCC)   Macrocytic anemia   Closed fracture of right proximal tibia   Hyponatremia   Prolonged QT interval   Closed fracture of distal end of left femur (HCC)   Distal left femur fracture  Closed fracture of right proximal tibia Status post fall: S/p ORIF of right tibial plateau fracture, left distal femur fracture and removal of hardware from left femur by Dr Jena Gauss 02/21/22. Continue DVT prophylaxis with Lovenox, pain control with oral/iv opiates,nsaids. Cont PT OT, inpatient rehab consulted.  Acute blood loss anemia in the setting of hip fracture/surgery Chronic microcytic anemia: 1 unit transfusion completed 8/27.Hb is improved continue to monitor and transfuse for less than 7 g. B12/folate is normal. Recent Labs  Lab 02/21/22 0703 02/22/22 0424 02/23/22 0143 02/23/22 1825 02/24/22 0434  HGB 8.3* 7.7* 6.4* 8.0* 8.6*  HCT 24.6* 23.7* 18.8* 23.0* 25.3*   COPD Chronic hypoxic respiratory failure on nocturnal oxygen: Not in exacerbation. Conti home o2,IS, PT OT.  Hyponatremia: Likely some hypovolemia: Improved.  Prolonged QT interval-minimize QT prolonging medication. Alcohol abuse: Watch for withdrawals continue CIWA Ativan, supplement/vitamin Bipolar disorder: Mood stable on Seroquel trazodone and Ambien Thrombocytopenia-monitor platelet count,is stable Recent Labs  Lab 02/20/22 0517 02/21/22 0703 02/22/22 0424 02/23/22 0143 02/24/22 0434  PLT 103* 121* 124* 114* 153   DVT prophylaxis: enoxaparin (LOVENOX) injection 40 mg Start: 02/22/22 0800 SCDs Start: 02/21/22 1416 SCDs Start: 02/21/22 0428 Code Status:   Code Status: Full Code Family Communication:  plan of care discussed with patient at bedside. Patient status is: Inpatient  because of multiple fractures Level of care: Med-Surg   Dispo: The patient is from: home            Anticipated disposition:  CIR  once available.  Patient is medically stable  Mobility Assessment (last 72 hours)     Mobility Assessment     Row Name 02/23/22 0753 02/22/22 1925 02/22/22 1352 02/22/22 1200 02/21/22 2048   Does patient have an order for bedrest or is patient medically unstable No - Continue assessment No - Continue assessment -- -- No - Continue assessment   What is the highest level of mobility based on the progressive mobility assessment? Level 1 (Bedfast) - Unable to balance while sitting on edge of bed Level 1 (Bedfast) - Unable to balance while sitting on edge of bed Level 1 (Bedfast) - Unable to balance while sitting on edge of bed Level 3 (Stands with assist) - Balance while standing  and cannot march in place Level 2 (Chairfast) - Balance while sitting on edge of bed and cannot stand   Is the above level different from baseline mobility prior to current illness? Yes - Recommend PT order Yes - Recommend PT order -- -- Yes - Recommend PT order             Objective: Vitals last 24 hrs: Vitals:   02/23/22 2014 02/23/22 2104 02/24/22 0441 02/24/22 0907  BP: 102/76  104/75   Pulse: (!) 108 100 (!) 106 (!) 115  Resp: 18 18 18 17   Temp: 98.5 F (36.9 C)  98.6 F (37 C)   TempSrc: Oral  Oral   SpO2: 100% 100% 100% 100%  Weight:      Height:       Weight change:   Physical Examination: General exam: AA, older than stated age, weak appearing. HEENT:Oral mucosa moist, Ear/Nose WNL grossly, dentition normal. Respiratory system: bilaterally diminished, no use of accessory muscle Cardiovascular system: S1 & S2 +, No JVD,. Gastrointestinal system: Abdomen soft,NT,ND,BS+ Nervous System:Alert, awake, moving extremities and grossly nonfocal Extremities: LE ankle edema neg, b/l surgical site w/ ACE wrap+ Skin: No rashes,no icterus. MSK: Normal muscle bulk,tone, power    Medications reviewed:  Scheduled Meds:  acetaminophen  650 mg Oral Q6H   allopurinol  100 mg Oral Daily   docusate sodium  100 mg Oral BID   enoxaparin (LOVENOX) injection  40 mg Subcutaneous Q24H   feeding supplement  237 mL Oral BID BM   folic acid  1 mg Oral Daily   gabapentin  300 mg Oral TID   latanoprost  1 drop Both Eyes QHS   LORazepam  0-4 mg Intravenous Q12H   mometasone-formoterol  2 puff Inhalation BID   multivitamin with minerals  1 tablet Oral Daily   nicotine  21 mg Transdermal Daily   pantoprazole  40 mg Oral BID   QUEtiapine  150 mg Oral BID   sucralfate  1 g Oral TID AC & HS   thiamine  100 mg Oral Daily   Or   thiamine  100 mg Intravenous Daily   umeclidinium bromide  1 puff Inhalation Daily   zolpidem  10 mg Oral QHS   Continuous Infusions:  methocarbamol (ROBAXIN) IV      Diet Order             Diet Heart Room service appropriate? Yes; Fluid consistency: Thin  Diet effective now  Nutrition Problem: Increased nutrient needs Etiology: post-op healing Signs/Symptoms: estimated needs Interventions: Ensure Enlive (each supplement provides 350kcal and 20 grams of protein), MVI   Intake/Output Summary (Last 24 hours) at 02/24/2022 1015 Last data filed at 02/24/2022 [id number] Gross per 24 hour  Intake 981.25 ml  Output 3200 ml  Net -2218.75 ml   Net IO Since Admission: -3,526.38 mL [02/24/22 1015]  Wt Readings from Last 3 Encounters:  02/21/22 71.2 kg  02/19/22 70.3 kg  10/23/21 70.3 kg     Unresulted Labs (From admission, onward)     Start     Ordered   02/28/22 0500  Creatinine, serum  (enoxaparin (LOVENOX)    CrCl >/= 30 ml/min)  Weekly,   R     Comments: while on enoxaparin therapy    02/21/22 1415   02/24/22 0922  VITAMIN D 25 Hydroxy (Vit-D Deficiency, Fractures)  Add-on,   AD       Question:  Specimen collection method  Answer:  Lab=Lab collect   02/24/22 0921   02/24/22 0500  CBC  Daily at 5am,   R      02/23/22 [id number]           Data Reviewed: I have personally reviewed following labs and imaging studies CBC: Recent Labs  Lab 02/19/22 0911 02/20/22 0517 02/21/22 0703 02/22/22 0424 02/23/22 0143 02/23/22 1825 02/24/22 0434  WBC 10.8* 6.6 6.5 7.5 7.9  --  8.7  NEUTROABS 8.5*  --   --   --   --   --   --   HGB 11.7* 8.6* 8.3* 7.7* 6.4* 8.0* 8.6*  HCT 33.8* 24.6* 24.6* 23.7* 18.8* 23.0* 25.3*  MCV 102.1* 100.0 106.5* 108.7* 106.8*  --  103.3*  PLT 169 103* 121* 124* 114*  --  0000000   Basic Metabolic Panel: Recent Labs  Lab 02/20/22 0517 02/21/22 0703 02/22/22 0424 02/23/22 0143 02/24/22 0434  NA 129* 133* 135 135 135  K 4.3 4.1 4.2 4.1 4.3  CL 99 102 101 99 103  CO2 23 29 27 27 26   GLUCOSE 191* 101* 140* 108* 105*  BUN 8 8 9 11 13   CREATININE 0.43* 0.51* 0.72 0.64 0.74  CALCIUM 7.4* 7.6* 8.1* 8.0* 8.2*   GFR: Estimated Creatinine Clearance: 97.7 mL/min (by C-G formula based on SCr of 0.74 mg/dL). Liver Function Tests: Recent Labs  Lab 02/19/22 0911 02/21/22 0703  AST 54* 33  ALT 38 28  ALKPHOS 103 78  BILITOT 0.9 0.7  PROT 5.6* 4.5*  ALBUMIN 2.8* 2.3*   No results for input(s): "LIPASE", "AMYLASE" in the last 168 hours. No results for input(s): "AMMONIA" in the last 168 hours. Coagulation Profile: No results for input(s): "INR", "PROTIME" in the last 168 hours. BNP (last 3 results) No results for input(s): "PROBNP" in the last 8760 hours. HbA1C: No results for input(s): "HGBA1C" in the last 72 hours. CBG: No results for input(s): "GLUCAP" in the last 168 hours. Lipid Profile: No results for input(s): "CHOL", "HDL", "LDLCALC", "TRIG", "CHOLHDL", "LDLDIRECT" in the last 72 hours. Thyroid Function Tests: No results for input(s): "TSH", "T4TOTAL", "FREET4", "T3FREE", "THYROIDAB" in the last 72 hours. Sepsis Labs: No results for input(s): "PROCALCITON", "LATICACIDVEN" in the last 168 hours.  No results found for this or any previous visit (from the past 240 hour(s)).   Antimicrobials: Anti-infectives (From admission, onward)    Start     Dose/Rate Route Frequency Ordered Stop   02/21/22 1845  ceFAZolin (ANCEF) IVPB 2g/100 mL  premix        2 g 200 mL/hr over 30 Minutes Intravenous Every 8 hours 02/21/22 1415 02/22/22 0956   02/21/22 1140  vancomycin (VANCOCIN) powder  Status:  Discontinued          As needed 02/21/22 1140 02/21/22 1303   02/21/22 0900  ceFAZolin (ANCEF) IVPB 2g/100 mL premix        2 g 200 mL/hr over 30 Minutes Intravenous On call to O.R. 02/21/22 0804 02/21/22 1103      Culture/Microbiology    Component Value Date/Time   SDES BLOOD RIGHT WRIST 10/06/2021 1738   SPECREQUEST  10/06/2021 1738    BOTTLES DRAWN AEROBIC AND ANAEROBIC Blood Culture adequate volume   CULT  10/06/2021 1738    NO GROWTH 5 DAYS Performed at [facility], [address] [place], [place] [address]    REPTSTATUS 10/11/2021 FINAL 10/06/2021 1738    Other culture-see note  Radiology Studies: No results found.   LOS: 3 days   Lanae Boast, MD [facility]  02/24/2022, 10:15 AM

## 2022-02-24 NOTE — Progress Notes (Signed)
Orthopaedic Trauma Progress Note  SUBJECTIVE: Doing ok this morning. Pain in both legs, right worse than left. Pain medication helps some but doesn't last very long. No chest pain. No SOB. No nausea/vomiting. No other complaints.   OBJECTIVE:  Vitals:   02/24/22 0441 02/24/22 0907  BP: 104/75   Pulse: (!) 106 (!) 115  Resp: 18 17  Temp: 98.6 F (37 C)   SpO2: 100% 100%    General: Laying in bed, NAD Respiratory: No increased work of breathing.  RLE: Dressing changed, incisions with small amount of bloody drainage. New mepilex dressings applied. Swelling to the knee stable. Ecchymosis lower leg. Tolerates gentle knee ROM. Ankle DF/PF intact. Foot warm and well perfused. LLE: Dressings CDI. Tolerates gentle knee ROM. Ankle DF/PF intact. Foot warm and well perfused. + DP pulse  IMAGING: Stable post op imaging.   LABS:  Results for orders placed or performed during the hospital encounter of 02/21/22 (from the past 24 hour(s))  Vitamin B12     Status: None   Collection Time: 02/23/22 10:43 AM  Result Value Ref Range   Vitamin B-12 354 180 - 914 pg/mL  Iron and TIBC     Status: Abnormal   Collection Time: 02/23/22 10:43 AM  Result Value Ref Range   Iron 31 (L) 45 - 182 ug/dL   TIBC 846 (L) 962 - 952 ug/dL   Saturation Ratios 17 (L) 17.9 - 39.5 %   UIBC 152 ug/dL  Ferritin     Status: None   Collection Time: 02/23/22 10:43 AM  Result Value Ref Range   Ferritin 174 24 - 336 ng/mL  Folate     Status: None   Collection Time: 02/23/22 10:43 AM  Result Value Ref Range   Folate 14.9 >5.9 ng/mL  Hemoglobin and hematocrit, blood     Status: Abnormal   Collection Time: 02/23/22  6:25 PM  Result Value Ref Range   Hemoglobin 8.0 (L) 13.0 - 17.0 g/dL   HCT 84.1 (L) 32.4 - 40.1 %  Basic metabolic panel     Status: Abnormal   Collection Time: 02/24/22  4:34 AM  Result Value Ref Range   Sodium 135 135 - 145 mmol/L   Potassium 4.3 3.5 - 5.1 mmol/L   Chloride 103 98 - 111 mmol/L   CO2  26 22 - 32 mmol/L   Glucose, Bld 105 (H) 70 - 99 mg/dL   BUN 13 8 - 23 mg/dL   Creatinine, Ser 0.27 0.61 - 1.24 mg/dL   Calcium 8.2 (L) 8.9 - 10.3 mg/dL   GFR, Estimated >25 >36 mL/min   Anion gap 6 5 - 15  CBC     Status: Abnormal   Collection Time: 02/24/22  4:34 AM  Result Value Ref Range   WBC 8.7 4.0 - 10.5 K/uL   RBC 2.45 (L) 4.22 - 5.81 MIL/uL   Hemoglobin 8.6 (L) 13.0 - 17.0 g/dL   HCT 64.4 (L) 03.4 - 74.2 %   MCV 103.3 (H) 80.0 - 100.0 fL   MCH 35.1 (H) 26.0 - 34.0 pg   MCHC 34.0 30.0 - 36.0 g/dL   RDW 59.5 (H) 63.8 - 75.6 %   Platelets 153 150 - 400 K/uL   nRBC 0.0 0.0 - 0.2 %    ASSESSMENT: Perry Bullock is a 61 y.o. male, 3 Days Post-Op s/p ORIF LEFT DISTAL FEMUR ORIF RIGHT PROXIMAL TIBIAL  CV/Blood loss: Hemoglobin 8.6 this morning, stable.  Received 1 unit PRBCs 02/23/2022.  Hemodynamically stable  PLAN: Weightbearing: WBAT LLE.  Weightbearing transfers only RLE, NWB RLE for ambulation  ROM: Okay for unrestricted range of motion BLE Incisional and dressing care: Change as needed.  Okay to leave incisions open to air Showering: Okay to begin showering/getting incisions wet 02/25/2022 if no drainage from incisions Orthopedic device(s): None  Pain management:  1. Tylenol 650 mg q 6 hours scheduled 2. Robaxin 500 mg q 6 hours PRN 3. Oxycodone 5-15 mg q 4 hours PRN 4. Dilaudid 0.5-1 mg q 4 hours PRN 5. Gabapentin 300 mg TID 6. Toradol 15 mg q 6 hours PRN VTE prophylaxis: Lovenox, SCDs ID:  Ancef 2gm post op completed Foley/Lines:  No foley, KVO IVFs Impediments to Fracture Healing: Vitamin D level 21, started on Vit D3 supplementation Dispo: Therapies as tolerated, PT/OT recommending CIR.  Okay for discharge to next venue from ortho standpoint once cleared by medicine team and therapies D/C recommendations: - Oxycodone and Robaxin for pain control - Eliquis 2.5 mg BID x 30 days for DVT prophylaxis - Continue Vit D supplementation x 90 days  Follow - up plan:  2 weeks after discharge for wound check and repeat x-rays   Contact information:  Truitt Merle MD, Thyra Breed PA-C. After hours and holidays please check [email or web address] for group call information for [facility]   Thompson Caul, PA-C [phone] (office) [email or web address]

## 2022-02-24 NOTE — TOC CAGE-AID Note (Signed)
Transition of Care [facility]) - CAGE-AID Screening   Patient Details  Name: AMR STURTEVANT MRN: [id number] Date of Birth: November 30, 1960  [facility]) CM/SW Contact:    Janae Bridgeman, RN Phone Number: 02/24/2022, 3:41 PM   Clinical Narrative: Cage Assessment completed and patient declines resources at this time.   CAGE-AID Screening: Substance Abuse Screening unable to be completed due to: : Patient Refused  Have You Ever Felt You Ought to Cut Down on Your Drinking or Drug Use?: No Have People Annoyed You By Critizing Your Drinking Or Drug Use?: No Have You Felt Bad Or Guilty About Your Drinking Or Drug Use?: No Have You Ever Had a Drink or Used Drugs First Thing In The Morning to Steady Your Nerves or to Get Rid of a Hangover?: No CAGE-AID Score: 0  Substance Abuse Education Offered: Yes  Substance abuse interventions: Patient Counseling (Patient declined resources.)

## 2022-02-25 DIAGNOSIS — E559 Vitamin D deficiency, unspecified: Secondary | ICD-10-CM

## 2022-02-25 DIAGNOSIS — S72402A Unspecified fracture of lower end of left femur, initial encounter for closed fracture: Secondary | ICD-10-CM | POA: Diagnosis not present

## 2022-02-25 LAB — CBC
HCT: 22.5 % — ABNORMAL LOW (ref 39.0–52.0)
Hemoglobin: 7.8 g/dL — ABNORMAL LOW (ref 13.0–17.0)
MCH: 35.6 pg — ABNORMAL HIGH (ref 26.0–34.0)
MCHC: 34.7 g/dL (ref 30.0–36.0)
MCV: 102.7 fL — ABNORMAL HIGH (ref 80.0–100.0)
Platelets: 180 10*3/uL (ref 150–400)
RBC: 2.19 MIL/uL — ABNORMAL LOW (ref 4.22–5.81)
RDW: 14.6 % (ref 11.5–15.5)
WBC: 8.4 10*3/uL (ref 4.0–10.5)
nRBC: 0 % (ref 0.0–0.2)

## 2022-02-25 MED ORDER — FLUTICASONE PROPIONATE 50 MCG/ACT NA SUSP: 2.0000 | Freq: Every day | NASAL | Status: DC | PRN

## 2022-02-25 MED ORDER — DICLOFENAC SODIUM 1 % EX GEL
2.0000 g | Freq: Four times a day (QID) | CUTANEOUS | Status: DC | PRN
  Administered 2022-02-25 – 2022-03-03 (×2): 2 g via TOPICAL
  Filled 2022-02-25 (×2): qty 100

## 2022-02-25 MED ORDER — FERROUS SULFATE 325 (65 FE) MG PO TABS
325.0000 mg | ORAL_TABLET | Freq: Every day | ORAL | Status: DC
  Administered 2022-02-25 – 2022-03-04 (×8): 325 mg via ORAL
  Filled 2022-02-25 (×8): qty 1

## 2022-02-25 MED ORDER — POLYVINYL ALCOHOL 1.4 % OP SOLN
2.0000 [drp] | OPHTHALMIC | Status: DC | PRN
  Administered 2022-02-25 – 2022-03-04 (×4): 2 [drp] via OPHTHALMIC
  Filled 2022-02-25: qty 15

## 2022-02-25 MED ORDER — LORATADINE 10 MG PO TABS
10.0000 mg | ORAL_TABLET | Freq: Every day | ORAL | Status: DC
  Administered 2022-02-25 – 2022-03-04 (×8): 10 mg via ORAL
  Filled 2022-02-25 (×8): qty 1

## 2022-02-25 MED ORDER — BISACODYL 5 MG PO TBEC
5.0000 mg | DELAYED_RELEASE_TABLET | Freq: Every day | ORAL | Status: DC
  Administered 2022-02-27 – 2022-03-04 (×6): 5 mg via ORAL
  Filled 2022-02-25 (×6): qty 1

## 2022-02-25 NOTE — TOC Transition Note (Addendum)
Transition of Care [facility]) - CM/SW Discharge Note   Patient Details  Name: Perry Bullock MRN: [id number] Date of Birth: Aug 12, 1960  Transition of Care [facility]) CM/SW Contact:  Janae Bridgeman, RN Phone Number: 02/25/2022, 10:29 AM   Clinical Narrative:    CM spoke with the patient at the bedside and updated the patient that CIR was unable to offer the patient admission since his family/ significant other states that she is unable to care for him at the home.  The patient was faxed out yesterday and the patient was presented with bed offers this morning and given choice regarding facility - [facility] was chosen.  Message was sent to the attending physician to clarify when patient can be discharged to the facility.  I will follow up with [facility] to start insurance authorization for SNF placement.  02/25/2022  Patient states that he had poor care in the past at [facility] and not [facility] and was agreeable to admission at the facility.  I reached out to [facility] and they offered a bed on the patient.  CM at the facility will start insurance authorization for [facility] and authorization approval should take 2-3 days to approve.  Patient was updated.     Final next level of care: Skilled Nursing Facility Barriers to Discharge: Continued Medical Work up   Patient Goals and CMS Choice Patient states their goals for this hospitalization and ongoing recovery are:: To go to rehab at a SNF facility for care CMS Medicare.gov Compare Post Acute Care list provided to:: Patient Choice offered to / list presented to : Patient  Discharge Placement                       Discharge Plan and Services   Discharge Planning Services: CM Consult Post Acute Care Choice: Skilled Nursing Facility                               Social Determinants of Health (SDOH) Interventions     Readmission Risk Interventions    02/24/2022    3:26 PM  Readmission  Risk Prevention Plan  Transportation Screening Complete  Medication Review (RN Care Manager) Complete  PCP or Specialist appointment within 3-5 days of discharge Complete  HRI or Home Care Consult Complete  SW Recovery Care/Counseling Consult Complete  Palliative Care Screening Complete  Skilled Nursing Facility Complete

## 2022-02-25 NOTE — Anesthesia Postprocedure Evaluation (Signed)
Anesthesia Post Note  Patient: [name]  Procedure(s) Performed: OPEN REDUCTION INTERNAL FIXATION  OF LEFT DISTAL FEMUR (Left: Leg Upper) OPEN REDUCTION INTERNAL FIXATION OF RIGHT PROXIMAL TIBIAL (Right: Leg Lower)     Patient location during evaluation: PACU Anesthesia Type: General Level of consciousness: awake and alert Pain management: pain level controlled Vital Signs Assessment: post-procedure vital signs reviewed and stable Respiratory status: spontaneous breathing, nonlabored ventilation, respiratory function stable and patient connected to nasal cannula oxygen Cardiovascular status: blood pressure returned to baseline and stable Postop Assessment: no apparent nausea or vomiting Anesthetic complications: no   No notable events documented.          Perry Bullock

## 2022-02-25 NOTE — Progress Notes (Signed)
PROGRESS NOTE Perry Bullock  HAL:[id number] DOB: [birth year] DOA: 02/21/2022 PCP: [facility]   Brief Narrative/Hospital Course: 61 y.o.m w/ history significant for bipolar disorder, COPD on nocturnal oxygen,alcohol abuse, and gout who has been transferred from [facility] for evaluation of bilateral lower extremity fractures by orthopedic trauma specialist. He normally ambulates with a walker, was experiencing some pain and swelling in his foot that he attributed to a gout flare, and was having some difficulty ambulating due to this.  He reports stumbling and falling onto his knees, experiencing severe bilateral knee pain, and was unable to stand up on his own. [facility] ED and Hospital Course: Upon arrival to the ED, patient is found to be afebrile and saturating well on room air with slight tachycardia and stable blood pressure.  EKG was notable for RBBB and QTc of 500 ms. He was treated with prednisone and colchicine in the ED, was still unable to ambulate, and was admitted to the hospital.  Orthopedic surgery was consulted, plain radiographs of the bilateral knees were obtained, and bilateral fractures were noted.  These were further assessed with CT, demonstrating acute intra-articular fracture of the distal left femur with large lipohemarthrosis, as well as acute fracture of the right proximal tibia with intra-articular extension, and acute fibular head and neck fractures.Ortho Dr Emeline Gins [facility] discussed the case with orthopedic trauma specialist at [facility] Dr Carola Frost and transferred for evaluation. Patient underwent ORIF of right tibial plateau fracture, left distal femur fracture and removal of hardware from left femur by Dr Jena Gauss 02/21/22.  Hemoglobin dropped due to blood loss anemia needing 1 unit PRBC transfusion 8/27.  PT OT recommending CIR    Subjective: Seen and examined this morning.  Still complains of pain on his right leg which has a lot of bruise Hemoglobin again  downtrending this morning to 7.8 g  Assessment and Plan: Principal Problem:   Closed fracture of left distal femur (HCC) Active Problems:   Alcohol abuse   Thrombocytopenia (HCC)   Bipolar disorder (HCC)   COPD (chronic obstructive pulmonary disease) (HCC)   Macrocytic anemia   Closed fracture of right proximal tibia   Hyponatremia   Prolonged QT interval   Closed fracture of distal end of left femur (HCC)   Vitamin D deficiency   Distal left femur fracture  Closed fracture of right proximal tibia Status post fall: S/p ORIF of right tibial plateau fracture, left distal femur fracture and removal of hardware from left femur by Dr Jena Gauss 02/21/22. Continue DVT prophylaxis with Lovenox here-Ortho planning for Eliquis 2.5 twice daily x3 days on discharge along with oxycodone, Robaxin for pain control and vitamin D supplementation  Vitamin D deficiency continue supplementation as above  Acute blood loss anemia in the setting of hip fracture/surgery Chronic microcytic anemia: 1 unit transfusion completed 8/27.Hb trended up but again downtrending.  Check H&H again in the morning.B12/folate is normal. Recent Labs  Lab 02/22/22 0424 02/23/22 0143 02/23/22 1825 02/24/22 0434 02/25/22 0224  HGB 7.7* 6.4* 8.0* 8.6* 7.8*  HCT 23.7* 18.8* 23.0* 25.3* 22.5*   COPD Chronic hypoxic respiratory failure on nocturnal oxygen:  Not in exacerbation. Conti home o2,IS, PT OT.  Hyponatremia: Likely some hypovolemia: Improved.  Prolonged QT interval-minimize QT prolonging medication. Alcohol abuse: Currently no signs of withdrawal, continue supplemental vitamins.   Bipolar disorder: Mood stable, on home Seroquel trazodone and Ambien Thrombocytopenia-improved  Recent Labs  Lab 02/21/22 0703 02/22/22 0424 02/23/22 0143 02/24/22 0434 02/25/22 0224  PLT 121*  124* 114* 153 180   DVT prophylaxis: enoxaparin (LOVENOX) injection 40 mg Start: 02/22/22 0800 SCDs Start: 02/21/22 1416 SCDs Start:  02/21/22 0428 Code Status:   Code Status: Full Code Family Communication: plan of care discussed with patient at bedside. Patient status is: Inpatient because of multiple fractures Level of care: Med-Surg   Dispo: The patient is from: home            Anticipated disposition:  CIR not an option at this time planning for skilled nursing facility likely over the weekend if Hb stable and bed available.  Mobility Assessment (last 72 hours)     Mobility Assessment     Row Name 02/24/22 19:32:47 02/24/22 1252 02/24/22 1128 02/23/22 0753 02/22/22 1925   Does patient have an order for bedrest or is patient medically unstable No - Continue assessment -- -- No - Continue assessment No - Continue assessment   What is the highest level of mobility based on the progressive mobility assessment? Level 3 (Stands with assist) - Balance while standing  and cannot march in place Level 3 (Stands with assist) - Balance while standing  and cannot march in place Level 5 (Walks with assist in room/hall) - Balance while stepping forward/back and can walk in room with assist - Complete  can stand pivot with A and RW Level 1 (Bedfast) - Unable to balance while sitting on edge of bed Level 1 (Bedfast) - Unable to balance while sitting on edge of bed   Is the above level different from baseline mobility prior to current illness? -- -- -- Yes - Recommend PT order Yes - Recommend PT order    Row Name 02/22/22 1352 02/22/22 1200         What is the highest level of mobility based on the progressive mobility assessment? Level 1 (Bedfast) - Unable to balance while sitting on edge of bed Level 3 (Stands with assist) - Balance while standing  and cannot march in place                Objective: Vitals last 24 hrs: Vitals:   02/24/22 1932 02/25/22 0107 02/25/22 0513 02/25/22 0916  BP: 104/64 112/71 98/66 106/70  Pulse: (!) 112 98 94 (!) 110  Resp: 18 18 18 18   Temp: 99.3 F (37.4 C)  97.7 F (36.5 C) 97.6 F (36.4  C)  TempSrc: Oral  Oral Oral  SpO2: 99% 100% 100% 97%  Weight:      Height:       Weight change:   Physical Examination: General exam: AAox3, older than stated age, weak appearing. HEENT:Oral mucosa moist, Ear/Nose WNL grossly, dentition normal. Respiratory system: bilaterally diminished, no use of accessory muscle Cardiovascular system: S1 & S2 +, No JVD,. Gastrointestinal system: Abdomen soft,NT,ND,BS+ Nervous System:Alert, awake, moving extremities and grossly nonfocal Extremities:  LE surgical site with Aquacel dressing in place significant bruise on the right distal leg Skin: No rashes,no icterus. MSK: Normal muscle bulk,tone, power   Medications reviewed:  Scheduled Meds:  acetaminophen  650 mg Oral Q6H   allopurinol  100 mg Oral Daily   cholecalciferol  1,000 Units Oral Daily   docusate sodium  100 mg Oral BID   enoxaparin (LOVENOX) injection  40 mg Subcutaneous Q24H   feeding supplement  237 mL Oral BID BM   folic acid  1 mg Oral Daily   gabapentin  300 mg Oral TID   latanoprost  1 drop Both Eyes QHS   mometasone-formoterol  2 puff Inhalation BID   multivitamin with minerals  1 tablet Oral Daily   nicotine  21 mg Transdermal Daily   pantoprazole  40 mg Oral BID   QUEtiapine  150 mg Oral BID   sucralfate  1 g Oral TID AC & HS   thiamine  100 mg Oral Daily   Or   thiamine  100 mg Intravenous Daily   umeclidinium bromide  1 puff Inhalation Daily   zolpidem  10 mg Oral QHS   Continuous Infusions:  methocarbamol (ROBAXIN) IV      Diet Order             Diet Heart Room service appropriate? Yes; Fluid consistency: Thin  Diet effective now                 Nutrition Problem: Increased nutrient needs Etiology: post-op healing Signs/Symptoms: estimated needs Interventions: Ensure Enlive (each supplement provides 350kcal and 20 grams of protein), MVI   Intake/Output Summary (Last 24 hours) at 02/25/2022 1059 Last data filed at 02/25/2022 0912 Gross per 24  hour  Intake 1090 ml  Output 4700 ml  Net -3610 ml    Net IO Since Admission: -7,136.38 mL [02/25/22 1059]  Wt Readings from Last 3 Encounters:  02/21/22 71.2 kg  02/19/22 70.3 kg  10/23/21 70.3 kg     Unresulted Labs (From admission, onward)     Start     Ordered   02/28/22 0500  Creatinine, serum  (enoxaparin (LOVENOX)    CrCl >/= 30 ml/min)  Weekly,   R     Comments: while on enoxaparin therapy    02/21/22 1415   02/24/22 0500  CBC  Daily at 5am,   R      02/23/22 6606          Data Reviewed: I have personally reviewed following labs and imaging studies CBC: Recent Labs  Lab 02/19/22 0911 02/20/22 0517 02/21/22 0703 02/22/22 0424 02/23/22 0143 02/23/22 1825 02/24/22 0434 02/25/22 0224  WBC 10.8*   < > 6.5 7.5 7.9  --  8.7 8.4  NEUTROABS 8.5*  --   --   --   --   --   --   --   HGB 11.7*   < > 8.3* 7.7* 6.4* 8.0* 8.6* 7.8*  HCT 33.8*   < > 24.6* 23.7* 18.8* 23.0* 25.3* 22.5*  MCV 102.1*   < > 106.5* 108.7* 106.8*  --  103.3* 102.7*  PLT 169   < > 121* 124* 114*  --  153 180   < > = values in this interval not displayed.    Basic Metabolic Panel: Recent Labs  Lab 02/20/22 0517 02/21/22 0703 02/22/22 0424 02/23/22 0143 02/24/22 0434  NA 129* 133* 135 135 135  K 4.3 4.1 4.2 4.1 4.3  CL 99 102 101 99 103  CO2 23 29 27 27 26   GLUCOSE 191* 101* 140* 108* 105*  BUN 8 8 9 11 13   CREATININE 0.43* 0.51* 0.72 0.64 0.74  CALCIUM 7.4* 7.6* 8.1* 8.0* 8.2*    GFR: Estimated Creatinine Clearance: 97.7 mL/min (by C-G formula based on SCr of 0.74 mg/dL). Liver Function Tests: Recent Labs  Lab 02/19/22 0911 02/21/22 0703  AST 54* 33  ALT 38 28  ALKPHOS 103 78  BILITOT 0.9 0.7  PROT 5.6* 4.5*  ALBUMIN 2.8* 2.3*    No results for input(s): "LIPASE", "AMYLASE" in the last 168 hours. No results for input(s): "AMMONIA" in the  last 168 hours. Coagulation Profile: No results for input(s): "INR", "PROTIME" in the last 168 hours. BNP (last 3 results) No  results for input(s): "PROBNP" in the last 8760 hours. HbA1C: No results for input(s): "HGBA1C" in the last 72 hours. CBG: No results for input(s): "GLUCAP" in the last 168 hours. Lipid Profile: No results for input(s): "CHOL", "HDL", "LDLCALC", "TRIG", "CHOLHDL", "LDLDIRECT" in the last 72 hours. Thyroid Function Tests: No results for input(s): "TSH", "T4TOTAL", "FREET4", "T3FREE", "THYROIDAB" in the last 72 hours. Sepsis Labs: No results for input(s): "PROCALCITON", "LATICACIDVEN" in the last 168 hours.  No results found for this or any previous visit (from the past 240 hour(s)).  Antimicrobials: Anti-infectives (From admission, onward)    Start     Dose/Rate Route Frequency Ordered Stop   02/21/22 1845  ceFAZolin (ANCEF) IVPB 2g/100 mL premix        2 g 200 mL/hr over 30 Minutes Intravenous Every 8 hours 02/21/22 1415 02/22/22 0956   02/21/22 1140  vancomycin (VANCOCIN) powder  Status:  Discontinued          As needed 02/21/22 1140 02/21/22 1303   02/21/22 0900  ceFAZolin (ANCEF) IVPB 2g/100 mL premix        2 g 200 mL/hr over 30 Minutes Intravenous On call to O.R. 02/21/22 0804 02/21/22 1103      Culture/Microbiology    Component Value Date/Time   SDES BLOOD RIGHT WRIST 10/06/2021 1738   SPECREQUEST  10/06/2021 1738    BOTTLES DRAWN AEROBIC AND ANAEROBIC Blood Culture adequate volume   CULT  10/06/2021 1738    NO GROWTH 5 DAYS Performed at [facility], [address], [place], [place] [address]    REPTSTATUS 10/11/2021 FINAL 10/06/2021 1738    Other culture-see note  Radiology Studies: No results found.   LOS: 4 days   Lanae Boast, MD [facility]  02/25/2022, 10:59 AM

## 2022-02-26 DIAGNOSIS — S82101K Unspecified fracture of upper end of right tibia, subsequent encounter for closed fracture with nonunion: Secondary | ICD-10-CM | POA: Diagnosis not present

## 2022-02-26 DIAGNOSIS — F319 Bipolar disorder, unspecified: Secondary | ICD-10-CM | POA: Diagnosis not present

## 2022-02-26 DIAGNOSIS — E559 Vitamin D deficiency, unspecified: Secondary | ICD-10-CM

## 2022-02-26 DIAGNOSIS — J449 Chronic obstructive pulmonary disease, unspecified: Secondary | ICD-10-CM | POA: Diagnosis not present

## 2022-02-26 DIAGNOSIS — S72402A Unspecified fracture of lower end of left femur, initial encounter for closed fracture: Secondary | ICD-10-CM | POA: Diagnosis not present

## 2022-02-26 LAB — CBC
HCT: 24 % — ABNORMAL LOW (ref 39.0–52.0)
Hemoglobin: 8.1 g/dL — ABNORMAL LOW (ref 13.0–17.0)
MCH: 35.1 pg — ABNORMAL HIGH (ref 26.0–34.0)
MCHC: 33.8 g/dL (ref 30.0–36.0)
MCV: 103.9 fL — ABNORMAL HIGH (ref 80.0–100.0)
Platelets: 289 10*3/uL (ref 150–400)
RBC: 2.31 MIL/uL — ABNORMAL LOW (ref 4.22–5.81)
RDW: 13.7 % (ref 11.5–15.5)
WBC: 7 10*3/uL (ref 4.0–10.5)
nRBC: 0 % (ref 0.0–0.2)

## 2022-02-26 LAB — GLUCOSE, CAPILLARY: Glucose-Capillary: 104 mg/dL — ABNORMAL HIGH (ref 70–99)

## 2022-02-26 MED ORDER — METHOCARBAMOL 500 MG PO TABS: 500.0000 mg | ORAL_TABLET | Freq: Four times a day (QID) | ORAL | 0 refills | Status: DC | PRN

## 2022-02-26 MED ORDER — OXYCODONE HCL 10 MG PO TABS: 10.0000 mg | ORAL_TABLET | ORAL | 0 refills | Status: DC | PRN

## 2022-02-26 MED ORDER — VITAMIN D3 25 MCG PO TABS: 1000.0000 [IU] | ORAL_TABLET | Freq: Every day | ORAL | 2 refills | Status: AC

## 2022-02-26 MED ORDER — ACETAMINOPHEN 325 MG PO TABS
650.0000 mg | ORAL_TABLET | Freq: Four times a day (QID) | ORAL | Status: DC | PRN
Start: 2022-02-26 — End: 2023-01-13

## 2022-02-26 MED ORDER — ALBUTEROL SULFATE (2.5 MG/3ML) 0.083% IN NEBU: 2.5000 mg | INHALATION_SOLUTION | RESPIRATORY_TRACT | Status: DC | PRN

## 2022-02-26 MED ORDER — APIXABAN 2.5 MG PO TABS: 2.5000 mg | ORAL_TABLET | Freq: Two times a day (BID) | ORAL | 0 refills | Status: DC

## 2022-02-26 MED ORDER — ALBUTEROL SULFATE (2.5 MG/3ML) 0.083% IN NEBU
2.5000 mg | INHALATION_SOLUTION | Freq: Three times a day (TID) | RESPIRATORY_TRACT | Status: DC
  Administered 2022-02-26: 2.5 mg via RESPIRATORY_TRACT
  Filled 2022-02-26: qty 3

## 2022-02-26 NOTE — Discharge Instructions (Addendum)
Orthopaedic Trauma Service Discharge Instructions   General Discharge Instructions  WEIGHT BEARING STATUS:Weightbearing as tolerated left lower extremity. Weightbearing transfers only right lower extremity. Must remain non-weightbearing right leg for ambulation   RANGE OF MOTION/ACTIVITY: ok for unrestricted range of motion bilateral hips, knees, ankle  Wound Care: Incisions can be left open to air if there is no drainage. If incision continues to have drainage, follow wound care instructions below. Okay to shower if no drainage from incisions.  DVT/PE prophylaxis:  Eliquis 2.5 mg twice daily x 30 days  Diet: as you were eating previously.  Can use over the counter stool softeners and bowel preparations, such as Miralax, to help with bowel movements.  Narcotics can be constipating.  Be sure to drink plenty of fluids  PAIN MEDICATION USE AND EXPECTATIONS  You have likely been given narcotic medications to help control your pain.  After a traumatic event that results in an fracture (broken bone) with or without surgery, it is ok to use narcotic pain medications to help control one's pain.  We understand that everyone responds to pain differently and each individual patient will be evaluated on a regular basis for the continued need for narcotic medications. Ideally, narcotic medication use should last no more than 6-8 weeks (coinciding with fracture healing).   As a patient it is your responsibility as well to monitor narcotic medication use and report the amount and frequency you use these medications when you come to your office visit.   We would also advise that if you are using narcotic medications, you should take a dose prior to therapy to maximize you participation.  IF YOU ARE ON NARCOTIC MEDICATIONS IT IS NOT PERMISSIBLE TO OPERATE A MOTOR VEHICLE (MOTORCYCLE/CAR/TRUCK/MOPED) OR HEAVY MACHINERY DO NOT MIX NARCOTICS WITH OTHER CNS (CENTRAL NERVOUS SYSTEM) DEPRESSANTS SUCH AS  ALCOHOL   STOP SMOKING OR USING NICOTINE PRODUCTS!!!!  As discussed nicotine severely impairs your body's ability to heal surgical and traumatic wounds but also impairs bone healing.  Wounds and bone heal by forming microscopic blood vessels (angiogenesis) and nicotine is a vasoconstrictor (essentially, shrinks blood vessels).  Therefore, if vasoconstriction occurs to these microscopic blood vessels they essentially disappear and are unable to deliver necessary nutrients to the healing tissue.  This is one modifiable factor that you can do to dramatically increase your chances of healing your injury.    (This means no smoking, no nicotine gum, patches, etc)  DO NOT USE NONSTEROIDAL ANTI-INFLAMMATORY DRUGS (NSAID'S)  Using products such as Advil (ibuprofen), Aleve (naproxen), Motrin (ibuprofen) for additional pain control during fracture healing can delay and/or prevent the healing response.  If you would like to take over the counter (OTC) medication, Tylenol (acetaminophen) is ok.  However, some narcotic medications that are given for pain control contain acetaminophen as well. Therefore, you should not exceed more than 4000 mg of tylenol in a day if you do not have liver disease.  Also note that there are may OTC medicines, such as cold medicines and allergy medicines that my contain tylenol as well.  If you have any questions about medications and/or interactions please ask your doctor/PA or your pharmacist.      ICE AND ELEVATE INJURED/OPERATIVE EXTREMITY  Using ice and elevating the injured extremity above your heart can help with swelling and pain control.  Icing in a pulsatile fashion, such as 20 minutes on and 20 minutes off, can be followed.    Do not place ice directly on skin. Make  sure there is a barrier between to skin and the ice pack.    Using frozen items such as frozen peas works well as the conform nicely to the are that needs to be iced.  USE AN ACE WRAP OR TED HOSE FOR SWELLING  CONTROL  In addition to icing and elevation, Ace wraps or TED hose are used to help limit and resolve swelling.  It is recommended to use Ace wraps or TED hose until you are informed to stop.    When using Ace Wraps start the wrapping distally (farthest away from the body) and wrap proximally (closer to the body)   Example: If you had surgery on your leg or thing and you do not have a splint on, start the ace wrap at the toes and work your way up to the thigh        If you had surgery on your upper extremity and do not have a splint on, start the ace wrap at your fingers and work your way up to the upper arm   CALL THE OFFICE FOR MEDICATION REFILLS OR WITH ANY QUESTIONS/CONCERNS: [phone]   VISIT OUR WEBSITE FOR ADDITIONAL INFORMATION: [email or web address]     Discharge Wound Care Instructions  Do NOT apply any ointments, solutions or lotions to pin sites or surgical wounds.  These prevent needed drainage and even though solutions like hydrogen peroxide kill bacteria, they also damage cells lining the pin sites that help fight infection.  Applying lotions or ointments can keep the wounds moist and can cause them to breakdown and open up as well. This can increase the risk for infection. When in doubt call the office.  If any drainage is noted, use one layer of adaptic or Mepitel, then gauze, Kerlix, and an ace wrap. - These dressing supplies should be available at local medical supply stores [facility], [facility], etc) as well as Insurance claims handler ([employer], [facility], [place], etc)  Once the incision is completely dry and without drainage, it may be left open to air out.  Showering may begin 36-48 hours later.  Cleaning gently with soap and water.

## 2022-02-26 NOTE — Progress Notes (Addendum)
Orthopaedic Trauma Progress Note  SUBJECTIVE: Doing fairly well today. Continues to have pain in both legs, right worse than left. Pain medication helps some but doesn't last very long. Using voltaren gel on the back of both legs. No chest pain. No SOB. No nausea/vomiting. No other complaints.   OBJECTIVE:  Vitals:   02/26/22 0854 02/26/22 0902  BP: 95/67   Pulse: (!) 102   Resp: 16   Temp: 98.1 F (36.7 C)   SpO2: 94% 93%    General: Laying in bed, NAD Respiratory: No increased work of breathing.  RLE: Dressings changed, incisions CDI. New mepilex dressings applied and leg wrapped with ace wrap per patient's request. Swelling to the knee stable. Ecchymosis lower leg. Tolerates gentle knee ROM but endorses discomfort with this. Ankle DF/PF intact. Foot warm and well perfused. LLE: Dressings changed, incisions CDI. New Mepilex dressings applied. Tolerates gentle knee ROM. Ankle DF/PF intact. Foot warm and well perfused. + DP pulse  IMAGING: Stable post op imaging.   LABS:  Results for orders placed or performed during the hospital encounter of 02/21/22 (from the past 24 hour(s))  Glucose, capillary     Status: Abnormal   Collection Time: 02/26/22  4:14 AM  Result Value Ref Range   Glucose-Capillary 104 (H) 70 - 99 mg/dL    ASSESSMENT: Perry Bullock is a 61 y.o. male, 5 Days Post-Op s/p ORIF LEFT DISTAL FEMUR ORIF RIGHT PROXIMAL TIBIAL  CV/Blood loss: Hemoglobin 7.8 on 02/25/22.  Received 1 unit PRBCs 02/23/2022. Hemodynamically stable  PLAN: Weightbearing: WBAT LLE.  Weightbearing transfers only RLE, NWB RLE for ambulation  ROM: Okay for unrestricted range of motion BLE Incisional and dressing care: Change as needed.  Okay to leave incisions open to air Showering: Okay to begin showering/getting incisions  Orthopedic device(s): None  Pain management:  1. Tylenol 650 mg q 6 hours scheduled 2. Robaxin 500 mg q 6 hours PRN 3. Oxycodone 5-15 mg q 4 hours PRN 4. Dilaudid 0.5-1  mg q 4 hours PRN 5. Gabapentin 300 mg TID 6. Toradol 15 mg q 6 hours PRN VTE prophylaxis: Lovenox, SCDs ID:  Ancef 2gm post op completed Foley/Lines:  No foley, KVO IVFs Impediments to Fracture Healing: Vitamin D level 21, started on Vit D3 supplementation Dispo: Therapies as tolerated, PT/OT recommending SNF. TOC following for placement.  Okay for discharge to next venue from ortho standpoint once cleared by medicine team and therapies.    D/C recommendations --> I have been signed and placed in chart : - Oxycodone and Robaxin for pain control - Eliquis 2.5 mg BID x 30 days for DVT prophylaxis - Continue Vit D supplementation x 90 days  Follow - up plan: 2 weeks after discharge for wound check and repeat x-rays   Contact information:  Truitt Merle MD, Thyra Breed PA-C. After hours and holidays please check [email or web address] for group call information for [facility]   Thompson Caul, PA-C [phone] (office) [email or web address]

## 2022-02-26 NOTE — Progress Notes (Signed)
PROGRESS NOTE    Perry Bullock  MWU:[id number] DOB: May 12, 1961 DOA: 02/21/2022 PCP: [facility]    No chief complaint on file.   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 He normally ambulates with a walker, was experiencing some pain and swelling in his foot that he attributed to a gout flare, and was having some difficulty ambulating due to this.  He reports stumbling and falling onto his knees, experiencing severe bilateral knee pain, and was unable to stand up on his own. [facility] ED and Hospital Course: Upon arrival to the ED, patient is found to be afebrile and saturating well on room air with slight tachycardia and stable blood pressure.  EKG was notable for RBBB and QTc of 500 ms. He was treated with prednisone and colchicine in the ED, was still unable to ambulate, and was admitted to the hospital.  Orthopedic surgery was consulted, plain radiographs of the bilateral knees were obtained, and bilateral fractures were noted.  These were further assessed with CT, demonstrating acute intra-articular fracture of the distal left femur with large lipohemarthrosis, as well as acute fracture of the right proximal tibia with intra-articular extension, and acute fibular head and neck fractures.Ortho Dr Emeline Gins [facility] discussed the case with orthopedic trauma specialist at [facility] Dr Carola Frost and transferred for evaluation. Patient underwent ORIF of right tibial plateau fracture, left distal femur fracture and removal of hardware from left femur by Dr Jena Gauss 02/21/22.  Hemoglobin dropped due to blood loss anemia needing 1 unit PRBC transfusion 8/27.  PT OT recommending CIR    Assessment & Plan:   Principal Problem:   Closed fracture of left distal femur (HCC) Active Problems:    Alcohol abuse   Thrombocytopenia (HCC)   Bipolar disorder (HCC)   COPD (chronic obstructive pulmonary disease) (HCC)   Macrocytic anemia   Closed fracture of right proximal tibia   Hyponatremia   Prolonged QT interval   Closed fracture of distal end of left femur (HCC)   Vitamin D deficiency  #1 distal left femur fracture/closed fracture right proximal tibia/status post fall -Patient seen in consultation by orthopedics status post ORIF right tibial plateau fracture, left distal femur fracture with removal of hardware from left femur per Dr. Jena Gauss 02/21/2022. -Pain management per orthopedics. -Eliquis 2.5 mg twice daily x30 days for DVT prophylaxis on discharge per orthopedics. -Continue vitamin D supplementation x90 days per orthopedics -Per orthopedics.  2.  Vitamin D deficiency -Vitamin D supplementation x90 days as recommended by orthopedics. -Outpatient follow-up with PCP.  3.  Acute blood loss anemia in the setting of hip fracture/surgery/chronic microcytic anemia -Vitamin B12 level of 354. -Folate level of 14.9. -Status post transfusion 1 unit packed RBC 02/23/2022 -Hemoglobin stable at 8.1. -Follow H&H. -Transfusion threshold hemoglobin < 7.  4.  Chronic hypoxic respiratory failure on nocturnal oxygen/COPD -Patient with minimal wheezing noted on examination. -Continue Dulera, Incruse Ellipta. -Placed on scheduled albuterol 3 times daily.  5.  Hyponatremia -Likely secondary to hypovolemic hyponatremia improved.  6.  Prolonged QT interval -Resolved.  7.  Alcohol abuse -Patient currently with no signs of withdrawal. -Continue supplementation.  8.  Bipolar disorder -Continue home regimen of Seroquel, trazodone, Ambien.  9.  Thrombocytopenia -Improved.     DVT prophylaxis: Lovenox Code Status: Full Family Communication: Updated patient.  No family at  bedside. Disposition: SNF when bed available hopefully in the next 24 hours.  Status is: Inpatient Remains  inpatient appropriate because: Unsafe disposition   Consultants:  Orthopedics: Dr. Jena Gauss 02/21/2022  Procedures:  CT right knee/CT left knee 02/20/2022 Plain films of the left femur at 02/21/2022 Plain films of the right knee 02/21/2022, 02/19/2022 Plan films of the left knee 02/19/2022 ORIF of right tibial plateau fracture/ORIF of left distal femur fracture/removal of hardware from left femur per orthopedics: Dr. Jena Gauss 02/21/2022  Antimicrobials:  Anti-infectives (From admission, onward)    Start     Dose/Rate Route Frequency Ordered Stop   02/21/22 1845  ceFAZolin (ANCEF) IVPB 2g/100 mL premix        2 g 200 mL/hr over 30 Minutes Intravenous Every 8 hours 02/21/22 1415 02/22/22 0956   02/21/22 1140  vancomycin (VANCOCIN) powder  Status:  Discontinued          As needed 02/21/22 1140 02/21/22 1303   02/21/22 0900  ceFAZolin (ANCEF) IVPB 2g/100 mL premix        2 g 200 mL/hr over 30 Minutes Intravenous On call to O.R. 02/21/22 0804 02/21/22 1103         Subjective: Patient laying in bed.  No chest pain.  No shortness of breath.  No abdominal pain.  Complaining of pain in his right extremity stating that its painful and slow to heal compared to the left lower extremity.  States bandage was changed this morning.  Objective: Vitals:   02/25/22 2352 02/26/22 0428 02/26/22 0854 02/26/22 0902  BP: 100/72 94/65 95/67    Pulse: 96 99 (!) 102   Resp: 18 16 16    Temp: 98.4 F (36.9 C) 97.7 F (36.5 C) 98.1 F (36.7 C)   TempSrc: Oral  Oral   SpO2: 99% 98% 94% 93%  Weight:      Height:        Intake/Output Summary (Last 24 hours) at 02/26/2022 1055 Last data filed at 02/26/2022 0700 Gross per 24 hour  Intake 653 ml  Output 1750 ml  Net -1097 ml   Filed Weights   02/21/22 0230 02/21/22 0848  Weight: 71.7 kg 71.2 kg    Examination:  General exam: Appears calm and comfortable  Respiratory system: Minimal expiratory wheezing.  No crackles.  No rhonchi.  Fair air movement.   Speaking in full sentences.  Cardiovascular system: S1 & S2 heard, RRR. No JVD, murmurs, rubs, gallops or clicks. No pedal edema. Gastrointestinal system: Abdomen is nondistended, soft and nontender. No organomegaly or masses felt. Normal bowel sounds heard. Central nervous system: Alert and oriented. No focal neurological deficits. Extremities: Right lower extremity in bandage.  Left lower extremity with dressing noted C/C/I.  Right lower extremity with trace to 1+ edema. Skin: No rashes, lesions or ulcers Psychiatry: Judgement and insight appear normal. Mood & affect appropriate.     Data Reviewed: I have personally reviewed following labs and imaging studies  CBC: Recent Labs  Lab 02/21/22 0703 02/22/22 0424 02/23/22 0143 02/23/22 1825 02/24/22 0434 02/25/22 0224  WBC 6.5 7.5 7.9  --  8.7 8.4  HGB 8.3* 7.7* 6.4* 8.0* 8.6* 7.8*  HCT 24.6* 23.7* 18.8* 23.0* 25.3* 22.5*  MCV 106.5* 108.7* 106.8*  --  103.3* 102.7*  PLT 121* 124* 114*  --  153 180    Basic Metabolic Panel: Recent Labs  Lab 02/20/22 0517 02/21/22 0703 02/22/22 0424 02/23/22 0143 02/24/22 0434  NA 129* 133* 135 135 135  K 4.3 4.1  4.2 4.1 4.3  CL 99 102 101 99 103  CO2 23 29 27 27 26   GLUCOSE 191* 101* 140* 108* 105*  BUN 8 8 9 11 13   CREATININE 0.43* 0.51* 0.72 0.64 0.74  CALCIUM 7.4* 7.6* 8.1* 8.0* 8.2*    GFR: Estimated Creatinine Clearance: 97.7 mL/min (by C-G formula based on SCr of 0.74 mg/dL).  Liver Function Tests: Recent Labs  Lab 02/21/22 0703  AST 33  ALT 28  ALKPHOS 78  BILITOT 0.7  PROT 4.5*  ALBUMIN 2.3*    CBG: Recent Labs  Lab 02/26/22 0414  GLUCAP 104*     No results found for this or any previous visit (from the past 240 hour(s)).       Radiology Studies: No results found.      Scheduled Meds:  acetaminophen  650 mg Oral Q6H   allopurinol  100 mg Oral Daily   bisacodyl  5 mg Oral Daily   cholecalciferol  1,000 Units Oral Daily   docusate sodium  100  mg Oral BID   enoxaparin (LOVENOX) injection  40 mg Subcutaneous Q24H   feeding supplement  237 mL Oral BID BM   ferrous sulfate  325 mg Oral Daily   folic acid  1 mg Oral Daily   gabapentin  300 mg Oral TID   latanoprost  1 drop Both Eyes QHS   loratadine  10 mg Oral Daily   mometasone-formoterol  2 puff Inhalation BID   multivitamin with minerals  1 tablet Oral Daily   nicotine  21 mg Transdermal Daily   pantoprazole  40 mg Oral BID   QUEtiapine  150 mg Oral BID   sucralfate  1 g Oral TID AC & HS   thiamine  100 mg Oral Daily   Or   thiamine  100 mg Intravenous Daily   umeclidinium bromide  1 puff Inhalation Daily   zolpidem  10 mg Oral QHS   Continuous Infusions:  methocarbamol (ROBAXIN) IV       LOS: 5 days    Time spent: 35 minutes    02/23/22, MD [facility]   To contact the attending provider between 7A-7P or the covering provider during after hours 7P-7A, please log into the web site [email or web address] and access using universal [place] password for that web site. If you do not have the password, please call the hospital operator.  02/26/2022, 10:55 AM

## 2022-02-26 NOTE — Progress Notes (Addendum)
Physical Therapy Treatment Patient Details Name: Perry Bullock MRN: [id number] DOB: 10/27/1960 Today's Date: 02/26/2022   History of Present Illness 61 y.o.m admitted 8/25 for BIL LE fractures. S/p ORIF Lt distal femur, ORIF Rt proximal tibia. 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    PT Comments    Pt received in supine, c/o BLE moderate to severe pain but agreeable to therapy session with good participation and tolerance for transfer training. Pt c/o 10/10 pain with stand pivot transfer from bed>wheelchair using RW and reports less pain with squat pivot from wheelchair to EOB, recommend pt utilize drop arm recliner for OOB transfers with nursing staff toward his stronger L side. Pt with improved activity tolerance this date with emphasis on wheelchair safety and equipment use, body mechanics with transfers and BLE ROM exercises for strengthening and to prevent contracture. Pt continues to benefit from PT services to progress toward functional mobility goals.    Recommendations for follow up therapy are one component of a multi-disciplinary discharge planning process, led by the attending physician.  Recommendations may be updated based on patient status, additional functional criteria and insurance authorization.  Follow Up Recommendations  Skilled nursing-short term rehab (<3 hours/day) Can patient physically be transported by private vehicle: No   Assistance Recommended at Discharge Frequent or constant Supervision/Assistance  Patient can return home with the following A lot of help with bathing/dressing/bathroom;Two people to help with walking and/or transfers;Assistance with cooking/housework;Help with stairs or ramp for entrance;Assist for transportation   Equipment Recommendations  Other (comment) (TBD)    Recommendations for Other Services       Precautions / Restrictions Precautions Precautions:  Fall Restrictions Weight Bearing Restrictions: Yes RLE Weight Bearing: Non weight bearing (NWB for gait) LLE Weight Bearing: Weight bearing as tolerated Other Position/Activity Restrictions: RLE can be WBAT for transfers only; NWB for ambulation     Mobility  Bed Mobility Overal bed mobility: Needs Assistance Bed Mobility: Supine to Sit, Sit to Supine     Supine to sit: Min assist, HOB elevated Sit to supine: Min assist, +2 for safety/equipment   General bed mobility comments: VCs for sequencing, demo for pt how to use gait belt as strap to assist RLE to EOB/floor, pt able to carry over instruction well for this; minA for scooting hips with bed pad to more optimal posture; use of bed features and gait belt for RLE assist as well on return to supine    Transfers Overall transfer level: Needs assistance Equipment used: Rolling walker (2 wheels) Transfers: Sit to/from Stand, Bed to chair/wheelchair/BSC Sit to Stand: Max assist, +2 physical assistance Stand pivot transfers: Mod assist, +2 physical assistance   Squat pivot transfers: Min assist, +2 physical assistance     General transfer comment: Max A +2 sit<>stand from lowest bed height<>wheelchair on his L side using RW. VCs for hand placement but pt ignoring cues leading him to sit on his L hand with return squat pivot transfer from WC>bed.    [employer] mobility: Yes Wheelchair propulsion: Both upper extremities Wheelchair parts: Supervision/cueing Distance: 15 (x2 to/from sink, pt defers out into hallway) Wheelchair Assistance Details (indicate cue type and reason): verbal cues for environmental awareness and safe use of brakes/leg rests and for arm rest removal, pt receptive but poor carryover of cues for use of brakes needing reminder.  Modified Rankin (Stroke Patients Only)       Balance Overall balance  assessment: Needs assistance Sitting-balance support: No upper extremity  supported, Feet supported Sitting balance-Leahy Scale: Good     Standing balance support: Bilateral upper extremity supported, Reliant on assistive device for balance Standing balance-Leahy Scale: Poor Standing balance comment: RW and A from therapist                            Cognition Arousal/Alertness: Awake/alert Behavior During Therapy: WFL for tasks assessed/performed Overall Cognitive Status: Within Functional Limits for tasks assessed                                 General Comments: pt somewhat self-directed and ignoring some cues for UE and RLE positioning with transfers, leading him to put more weight on limb than would be ideal. Pt cooperative with encouragement.        Exercises General Exercises - Lower Extremity Ankle Circles/Pumps: AROM, Both, Seated, 5 reps Quad Sets: AROM, Right, 5 reps, Supine Straight Leg Raises:  (cues for quad set preceding leg lift) Other Exercises Other Exercises: pt performing seated BLE AROM: LAQ x10 reps unable to achieve TKE due to pain    General Comments General comments (skin integrity, edema, etc.): SpO2 90-93% on RA with exertion, HR to 118 bpm with pain/exertion      Pertinent Vitals/Pain Pain Assessment Pain Assessment: 0-10 Pain Score: 10-Worst pain ever Faces Pain Scale: Hurts whole lot Pain Location: RLE>LLE with stand pivot and scoot pivot TF Pain Descriptors / Indicators: Constant, Grimacing, Moaning, Operative site guarding Pain Intervention(s): Monitored during session, Premedicated before session, Repositioned, Patient requesting pain meds-RN notified, Ice applied (pt appreciative of ice packs for RLE pain)           PT Goals (current goals can now be found in the care plan section) Acute Rehab PT Goals Patient Stated Goal: Get better before going home PT Goal Formulation: With patient Time For Goal Achievement: 03/01/22 Progress towards PT goals: Progressing toward goals     Frequency    Min 3X/week      PT Plan Current plan remains appropriate    Co-evaluation PT/OT/SLP Co-Evaluation/Treatment: Yes Reason for Co-Treatment: For patient/therapist safety;To address functional/ADL transfers PT goals addressed during session: Mobility/safety with mobility;Balance;Proper use of DME;Strengthening/ROM        AM-PAC PT "6 Clicks" Mobility   Outcome Measure  Help needed turning from your back to your side while in a flat bed without using bedrails?: A Little Help needed moving from lying on your back to sitting on the side of a flat bed without using bedrails?: A Little Help needed moving to and from a bed to a chair (including a wheelchair)?: A Lot Help needed standing up from a chair using your arms (e.g., wheelchair or bedside chair)?: A Lot Help needed to walk in hospital room?: Total Help needed climbing 3-5 steps with a railing? : Total 6 Click Score: 12    End of Session Equipment Utilized During Treatment: Gait belt;Oxygen (received on O2 but pt self-doffed prior to OOB and 90% and greater on RA with exertion) Activity Tolerance: Patient limited by pain;Patient tolerated treatment well Patient left: with call bell/phone within reach;Other (comment);in bed;with bed alarm set (RLE elevated on yellow bone foam and ice packs to knee and shin) Nurse Communication: Mobility status;Patient requests pain meds PT Visit Diagnosis: History of falling (Z91.81);Difficulty in walking, not elsewhere classified (R26.2);Pain Pain -  Right/Left: Right Pain - part of body: Leg     Time: 4827-0786 PT Time Calculation (min) (ACUTE ONLY): 45 min  Charges:  $Therapeutic Activity: 8-22 mins $Wheel Chair Management: 8-22 mins                     Xaviera Flaten P., PTA Acute Rehabilitation Services Secure Chat Preferred 9a-5:30pm Office: [phone]    Dorathy Kinsman Nira Visscher 02/26/2022, 2:15 PM

## 2022-02-26 NOTE — Progress Notes (Signed)
Occupational Therapy Treatment Patient Details Name: Perry Bullock MRN: [id number] DOB: [birth year] Today's Date: 02/26/2022   History of present illness 61 y.o.m admitted 8/25 for BIL LE fractures. S/p ORIF Lt distal femur, ORIF Rt proximal tibia. 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   OT comments  Pt progressing towards goals, presenting at this time with increased BLE pain. Pt mod A for LB dressing with use of AE, set up A for seated grooming tasks at sink. Pt min A+2 for bed mobility, and min-max A +2 for transfers increased assistance for stand pivot vs lateral scoot transfer. Pt educated on use of sock aid and reacher for LB dressing, able to demo use. Pt presenting with impairments listed below, will follow acutely. Continue to recommend SNF at d/c.   Recommendations for follow up therapy are one component of a multi-disciplinary discharge planning process, led by the attending physician.  Recommendations may be updated based on patient status, additional functional criteria and insurance authorization.    Follow Up Recommendations  Skilled nursing-short term rehab (<3 hours/day)    Assistance Recommended at Discharge Frequent or constant Supervision/Assistance  Patient can return home with the following  Help with stairs or ramp for entrance;Assist for transportation;A lot of help with walking and/or transfers;A lot of help with bathing/dressing/bathroom;Assistance with cooking/housework   Equipment Recommendations  None recommended by OT    Recommendations for Other Services Rehab consult    Precautions / Restrictions Precautions Precautions: Fall Restrictions Weight Bearing Restrictions: Yes RLE Weight Bearing: Non weight bearing (for gait) LLE Weight Bearing: Weight bearing as tolerated Other Position/Activity Restrictions: RLE can be WBAT for transfers only; NWB for ambulation       Mobility Bed  Mobility Overal bed mobility: Needs Assistance Bed Mobility: Supine to Sit, Sit to Supine     Supine to sit: Min assist, HOB elevated Sit to supine: Min assist, +2 for safety/equipment   General bed mobility comments: VCs for sequencing, demo for pt how to use gait belt as strap to assist RLE to EOB/floor, pt able to carry over instruction well for this; minA for scooting hips with bed pad to more optimal posture; use of bed features and gait belt for RLE assist as well on return to supine    Transfers Overall transfer level: Needs assistance Equipment used: Rolling walker (2 wheels) Transfers: Sit to/from Stand, Bed to chair/wheelchair/BSC Sit to Stand: Max assist, +2 physical assistance Stand pivot transfers: Mod assist, +2 physical assistance Squat pivot transfers: Min assist, +2 physical assistance      Lateral/Scoot Transfers: Min assist, +2 physical assistance General transfer comment: stand pivot to w/c, lateral scoot transfer backt o bed     Balance Overall balance assessment: Needs assistance Sitting-balance support: No upper extremity supported, Feet supported Sitting balance-Leahy Scale: Good     Standing balance support: Bilateral upper extremity supported, Reliant on assistive device for balance Standing balance-Leahy Scale: Poor                             ADL either performed or assessed with clinical judgement   ADL Overall ADL's : Needs assistance/impaired     Grooming: Set up;Sitting Grooming Details (indicate cue type and reason): washes face and combs hair sitting at sink             Lower Body Dressing: Moderate assistance;With adaptive equipment;Sitting/lateral leans  Extremity/Trunk Assessment Upper Extremity Assessment Upper Extremity Assessment: Overall WFL for tasks assessed   Lower Extremity Assessment Lower Extremity Assessment: Defer to PT evaluation        Vision   Additional Comments: hx  of blind R eye   Perception Perception Perception: Not tested   Praxis Praxis Praxis: Not tested    Cognition Arousal/Alertness: Awake/alert Behavior During Therapy: WFL for tasks assessed/performed Overall Cognitive Status: Within Functional Limits for tasks assessed                                          Exercises      Shoulder Instructions       General Comments VSS on RA; 3L O2 donned at end of session per pt request    Pertinent Vitals/ Pain       Pain Assessment Pain Assessment: Faces Pain Score: 8  Faces Pain Scale: Hurts whole lot Pain Location: RLE>LLE with stand pivot and scoot pivot TF Pain Descriptors / Indicators: Constant, Grimacing, Moaning, Operative site guarding Pain Intervention(s): Limited activity within patient's tolerance, Monitored during session, Repositioned  Home Living                                          Prior Functioning/Environment              Frequency  Min 2X/week        Progress Toward Goals  OT Goals(current goals can now be found in the care plan section)  Progress towards OT goals: Progressing toward goals  Acute Rehab OT Goals Patient Stated Goal: decrease pain OT Goal Formulation: With patient Time For Goal Achievement: 03/08/22 Potential to Achieve Goals: Good ADL Goals Pt Will Perform Lower Body Bathing: with set-up;with supervision;sit to/from stand;with adaptive equipment Pt Will Perform Lower Body Dressing: with supervision;with set-up;sit to/from stand;with adaptive equipment Pt Will Transfer to Toilet: with supervision;ambulating;bedside commode Pt Will Perform Toileting - Clothing Manipulation and hygiene: with supervision;sit to/from stand Additional ADL Goal #1: Pt will be Mod I in and OOB for basic ADLs (increased time, possibly leg lifter)  Plan Discharge plan needs to be updated    Co-evaluation    PT/OT/SLP Co-Evaluation/Treatment: Yes Reason for  Co-Treatment: Complexity of the patient's impairments (multi-system involvement);For patient/therapist safety;To address functional/ADL transfers PT goals addressed during session: Mobility/safety with mobility;Balance;Proper use of DME;Strengthening/ROM OT goals addressed during session: ADL's and self-care;Strengthening/ROM      AM-PAC OT "6 Clicks" Daily Activity     Outcome Measure   Help from another person eating meals?: None Help from another person taking care of personal grooming?: A Little Help from another person toileting, which includes using toliet, bedpan, or urinal?: A Lot Help from another person bathing (including washing, rinsing, drying)?: A Lot Help from another person to put on and taking off regular upper body clothing?: A Little Help from another person to put on and taking off regular lower body clothing?: A Lot 6 Click Score: 16    End of Session Equipment Utilized During Treatment: Gait belt;Rolling walker (2 wheels)  OT Visit Diagnosis: Other abnormalities of gait and mobility (R26.89);Muscle weakness (generalized) (M62.81);Pain   Activity Tolerance Patient tolerated treatment well   Patient Left in bed;with call bell/phone within reach;with bed alarm set   Nurse Communication  Mobility status        Time: 1304-1350 OT Time Calculation (min): 46 min  Charges: OT General Charges $OT Visit: 1 Visit OT Treatments $Self Care/Home Management : 8-22 mins  Alfonzo Beers, OTD, OTR/L Acute Rehab [phone] - [phone]   Mayer Masker 02/26/2022, 2:57 PM

## 2022-02-27 DIAGNOSIS — S72402A Unspecified fracture of lower end of left femur, initial encounter for closed fracture: Secondary | ICD-10-CM | POA: Diagnosis not present

## 2022-02-27 DIAGNOSIS — S82101K Unspecified fracture of upper end of right tibia, subsequent encounter for closed fracture with nonunion: Secondary | ICD-10-CM | POA: Diagnosis not present

## 2022-02-27 DIAGNOSIS — J449 Chronic obstructive pulmonary disease, unspecified: Secondary | ICD-10-CM | POA: Diagnosis not present

## 2022-02-27 DIAGNOSIS — F319 Bipolar disorder, unspecified: Secondary | ICD-10-CM | POA: Diagnosis not present

## 2022-02-27 LAB — CBC WITH DIFFERENTIAL/PLATELET
Abs Immature Granulocytes: 0.1 10*3/uL — ABNORMAL HIGH (ref 0.00–0.07)
Basophils Absolute: 0.1 10*3/uL (ref 0.0–0.1)
Basophils Relative: 1 %
Eosinophils Absolute: 0.1 10*3/uL (ref 0.0–0.5)
Eosinophils Relative: 2 %
HCT: 23.4 % — ABNORMAL LOW (ref 39.0–52.0)
Hemoglobin: 7.9 g/dL — ABNORMAL LOW (ref 13.0–17.0)
Immature Granulocytes: 2 %
Lymphocytes Relative: 22 %
Lymphs Abs: 1.5 10*3/uL (ref 0.7–4.0)
MCH: 35.4 pg — ABNORMAL HIGH (ref 26.0–34.0)
MCHC: 33.8 g/dL (ref 30.0–36.0)
MCV: 104.9 fL — ABNORMAL HIGH (ref 80.0–100.0)
Monocytes Absolute: 1.2 10*3/uL — ABNORMAL HIGH (ref 0.1–1.0)
Monocytes Relative: 19 %
Neutro Abs: 3.7 10*3/uL (ref 1.7–7.7)
Neutrophils Relative %: 54 %
Platelets: 324 10*3/uL (ref 150–400)
RBC: 2.23 MIL/uL — ABNORMAL LOW (ref 4.22–5.81)
RDW: 13.5 % (ref 11.5–15.5)
WBC: 6.6 10*3/uL (ref 4.0–10.5)
nRBC: 0 % (ref 0.0–0.2)

## 2022-02-27 LAB — BASIC METABOLIC PANEL
Anion gap: 6 (ref 5–15)
BUN: 14 mg/dL (ref 8–23)
CO2: 29 mmol/L (ref 22–32)
Calcium: 8.8 mg/dL — ABNORMAL LOW (ref 8.9–10.3)
Chloride: 101 mmol/L (ref 98–111)
Creatinine, Ser: 0.63 mg/dL (ref 0.61–1.24)
GFR, Estimated: >60 mL/min (ref >60)
Glucose, Bld: 104 mg/dL — ABNORMAL HIGH (ref 70–99)
Potassium: 4 mmol/L (ref 3.5–5.1)
Sodium: 136 mmol/L (ref 135–145)
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
## 2022-02-27 NOTE — Progress Notes (Signed)
Re:  Perry Bullock Date of Birth:  03-27-1961 Date:  02/27/2022  To Whom It May Concern:  Please be advised that the above-named patient will require a short-term nursing home stay - anticipated 30 days or less for rehabilitation and strengthening.  The plan is for return home.

## 2022-02-27 NOTE — Progress Notes (Signed)
PROGRESS NOTE    Perry Bullock  UEA:[id number] DOB: 1961-03-27 DOA: 02/21/2022 PCP: [facility]    No chief complaint on file.   Brief Narrative:  61 y.o.m w/ history significant for bipolar disorder, COPD on nocturnal oxygen,alcohol abuse, and gout who has been transferred from [facility] for evaluation of bilateral lower extremity fractures by orthopedic trauma specialist. He normally ambulates with a walker, was experiencing some pain and swelling in his foot that he attributed to a gout flare, and was having some difficulty ambulating due to this.  He reports stumbling and falling onto his knees, experiencing severe bilateral knee pain, and was unable to stand up on his own. [facility] ED and Hospital Course: Upon arrival to the ED, patient is found to be afebrile and saturating well on room air with slight tachycardia and stable blood pressure.  EKG was notable for RBBB and QTc of 500 ms. He was treated with prednisone and colchicine in the ED, was still unable to ambulate, and was admitted to the hospital.  Orthopedic surgery was consulted, plain radiographs of the bilateral knees were obtained, and bilateral fractures were noted.  These were further assessed with CT, demonstrating acute intra-articular fracture of the distal left femur with large lipohemarthrosis, as well as acute fracture of the right proximal tibia with intra-articular extension, and acute fibular head and neck fractures.Ortho Dr Emeline Gins [facility] discussed the case with orthopedic trauma specialist at [facility] Dr Carola Frost and transferred for evaluation. Patient underwent ORIF of right tibial plateau fracture, left distal femur fracture and removal of hardware from left femur by Dr Jena Gauss 02/21/22.  Hemoglobin dropped due to blood loss anemia needing 1 unit PRBC transfusion 8/27.  PT OT recommending CIR    Assessment & Plan:   Principal Problem:   Closed fracture of left distal femur (HCC) Active Problems:    Alcohol abuse   Thrombocytopenia (HCC)   Bipolar disorder (HCC)   COPD (chronic obstructive pulmonary disease) (HCC)   Macrocytic anemia   Closed fracture of right proximal tibia   Hyponatremia   Prolonged QT interval   Closed fracture of distal end of left femur (HCC)   Vitamin D deficiency  #1 distal left femur fracture/closed fracture right proximal tibia/status post fall -Patient seen in consultation by orthopedics status post ORIF right tibial plateau fracture, left distal femur fracture with removal of hardware from left femur per Dr. Jena Gauss 02/21/2022. -Pain management per orthopedics. -Eliquis 2.5 mg twice daily x30 days for DVT prophylaxis on discharge per orthopedics. -Continue vitamin D supplementation x90 days per orthopedics -Per orthopedics.  2.  Vitamin D deficiency -Vitamin D supplementation x90 days as recommended by orthopedics. -Outpatient follow-up with PCP.  3.  Acute blood loss anemia in the setting of hip fracture/surgery/chronic microcytic anemia -Vitamin B12 level of 354. -Folate level of 14.9. -Status post transfusion 1 unit packed RBC 02/23/2022 -Hemoglobin stable at 7.9. -Follow H&H. -Transfusion threshold hemoglobin < 7.  4.  Chronic hypoxic respiratory failure on nocturnal oxygen/COPD -Patient with minimal wheezing noted on examination. -Continue Dulera, Incruse Ellipta. -Continue albuterol nebs as needed.    5.  Hyponatremia -Likely secondary to hypovolemic hyponatremia. -Improved.  6.  Prolonged QT interval -Resolved.  7.  Alcohol abuse -Patient with no signs of withdrawal.   -Continue folic acid, multivitamin, thiamine.   8.  Bipolar disorder -Continue home regimen of Seroquel, trazodone, Ambien.  9.  Thrombocytopenia -Improved.     DVT prophylaxis: Lovenox Code Status: Full Family Communication: Updated  patient.  No family at bedside. Disposition: Medically stable for discharge.  SNF when bed available.  Status is:  Inpatient Remains inpatient appropriate because: Unsafe disposition   Consultants:  Orthopedics: Dr. Jena Gauss 02/21/2022  Procedures:  CT right knee/CT left knee 02/20/2022 Plain films of the left femur at 02/21/2022 Plain films of the right knee 02/21/2022, 02/19/2022 Plan films of the left knee 02/19/2022 ORIF of right tibial plateau fracture/ORIF of left distal femur fracture/removal of hardware from left femur per orthopedics: Dr. Jena Gauss 02/21/2022  Antimicrobials:  Anti-infectives (From admission, onward)    Start     Dose/Rate Route Frequency Ordered Stop   02/21/22 1845  ceFAZolin (ANCEF) IVPB 2g/100 mL premix        2 g 200 mL/hr over 30 Minutes Intravenous Every 8 hours 02/21/22 1415 02/22/22 0956   02/21/22 1140  vancomycin (VANCOCIN) powder  Status:  Discontinued          As needed 02/21/22 1140 02/21/22 1303   02/21/22 0900  ceFAZolin (ANCEF) IVPB 2g/100 mL premix        2 g 200 mL/hr over 30 Minutes Intravenous On call to O.R. 02/21/22 0804 02/21/22 1103         Subjective: Sitting up in bed, complaining of right lower extremity pain.  States she has got some pain medications.  Denies any significant shortness of breath.  No chest pain.    Objective: Vitals:   02/26/22 1956 02/27/22 0718 02/27/22 0826 02/27/22 0827  BP: (!) 86/63 99/69    Pulse: (!) 103 92    Resp:  14    Temp: 97.8 F (36.6 C) (!) 97.5 F (36.4 C)    TempSrc: Oral Oral    SpO2: 90% 100% 99% 99%  Weight:      Height:        Intake/Output Summary (Last 24 hours) at 02/27/2022 1018 Last data filed at 02/27/2022 0500 Gross per 24 hour  Intake --  Output 3250 ml  Net -3250 ml    Filed Weights   02/21/22 0230 02/21/22 0848  Weight: 71.7 kg 71.2 kg    Examination:  General exam: NAD. Respiratory system: Minimal expiratory wheezing.  No crackles.  No rhonchi.  Some coarse breath sounds.  Fair air movement.  Speaking in full sentences.  Cardiovascular system: Regular rate rhythm no murmurs  rubs or gallops.  No JVD.  No lower extremity edema.  Gastrointestinal system: Abdomen is soft, nontender, nondistended, positive bowel sounds.  No rebound.  No guarding.   Central nervous system: Alert and oriented. No focal neurological deficits. Extremities: Right lower extremity in bandage.  Left lower extremity with dressing noted C/C/I.  Right lower extremity with trace to 1+ edema. Skin: No rashes, lesions or ulcers Psychiatry: Judgement and insight appear normal. Mood & affect appropriate.     Data Reviewed: I have personally reviewed following labs and imaging studies  CBC: Recent Labs  Lab 02/23/22 0143 02/23/22 1825 02/24/22 0434 02/25/22 0224 02/26/22 1017 02/27/22 0348  WBC 7.9  --  8.7 8.4 7.0 6.6  NEUTROABS  --   --   --   --   --  3.7  HGB 6.4* 8.0* 8.6* 7.8* 8.1* 7.9*  HCT 18.8* 23.0* 25.3* 22.5* 24.0* 23.4*  MCV 106.8*  --  103.3* 102.7* 103.9* 104.9*  PLT 114*  --  153 180 289 324     Basic Metabolic Panel: Recent Labs  Lab 02/21/22 0703 02/22/22 0424 02/23/22 0143 02/24/22 0434 02/27/22 0348  NA 133* 135 135 135 136  K 4.1 4.2 4.1 4.3 4.0  CL 102 101 99 103 101  CO2 29 27 27 26 29   GLUCOSE 101* 140* 108* 105* 104*  BUN 8 9 11 13 14   CREATININE 0.51* 0.72 0.64 0.74 0.63  CALCIUM 7.6* 8.1* 8.0* 8.2* 8.8*     GFR: Estimated Creatinine Clearance: 97.7 mL/min (by C-G formula based on SCr of 0.63 mg/dL).  Liver Function Tests: Recent Labs  Lab 02/21/22 0703  AST 33  ALT 28  ALKPHOS 78  BILITOT 0.7  PROT 4.5*  ALBUMIN 2.3*     CBG: Recent Labs  Lab 02/26/22 0414  GLUCAP 104*      No results found for this or any previous visit (from the past 240 hour(s)).       Radiology Studies: No results found.      Scheduled Meds:  acetaminophen  650 mg Oral Q6H   allopurinol  100 mg Oral Daily   bisacodyl  5 mg Oral Daily   cholecalciferol  1,000 Units Oral Daily   docusate sodium  100 mg Oral BID   enoxaparin (LOVENOX)  injection  40 mg Subcutaneous Q24H   feeding supplement  237 mL Oral BID BM   ferrous sulfate  325 mg Oral Daily   folic acid  1 mg Oral Daily   gabapentin  300 mg Oral TID   latanoprost  1 drop Both Eyes QHS   loratadine  10 mg Oral Daily   mometasone-formoterol  2 puff Inhalation BID   multivitamin with minerals  1 tablet Oral Daily   nicotine  21 mg Transdermal Daily   pantoprazole  40 mg Oral BID   QUEtiapine  150 mg Oral BID   sucralfate  1 g Oral TID AC & HS   thiamine  100 mg Oral Daily   Or   thiamine  100 mg Intravenous Daily   umeclidinium bromide  1 puff Inhalation Daily   zolpidem  10 mg Oral QHS   Continuous Infusions:  methocarbamol (ROBAXIN) IV       LOS: 6 days    Time spent: 35 minutes    Irine Seal, MD [facility]   To contact the attending provider between 7A-7P or the covering provider during after hours 7P-7A, please log into the web site [email or web address] and access using universal [place] password for that web site. If you do not have the password, please call the hospital operator.  02/27/2022, 10:18 AM

## 2022-02-27 NOTE — TOC Progression Note (Signed)
Transition of Care [facility]) - Progression Note    Patient Details  Name: Perry Bullock MRN: [id number] Date of Birth: [birth year]  Transition of [facility] CM/SW Contact  Janae Bridgeman, RN Phone Number: 02/27/2022, 4:03 PM  Clinical Narrative:    CM spoke with [facility] and she receiving a call from the patient's Medicaid Insurer that the patient's PASRR had expired and new one needed to be obtained.  I completed a new screen and FL2 with the pending PASRR noted.  Dr. Janee Morn was made aware that new PASRR and insurance are pending at this time.  30 day note, progress notes and updated and signed FL2 was sent to [facility] for review.  [facility] SN[facility] is aware and she will update insurance provider.  ONce both are obtained, attending MD states that the patient is medically stable to discharge to the SNF.   Expected Discharge Plan: Skilled Nursing Facility Barriers to Discharge: Continued Medical Work up  Expected Discharge Plan and Services Expected Discharge Plan: Skilled Nursing Facility   Discharge Planning Services: CM Consult Post Acute Care Choice: Skilled Nursing Facility Living arrangements for the past 2 months: Single Family Home                                       Social Determinants of Health (SDOH) Interventions    Readmission Risk Interventions    02/24/2022    3:26 PM  Readmission Risk Prevention Plan  Transportation Screening Complete  Medication Review (RN Care Manager) Complete  PCP or Specialist appointment within 3-5 days of discharge Complete  HRI or Home Care Consult Complete  SW Recovery Care/Counseling Consult Complete  Palliative Care Screening Complete  Skilled Nursing Facility Complete

## 2022-02-28 DIAGNOSIS — J449 Chronic obstructive pulmonary disease, unspecified: Secondary | ICD-10-CM | POA: Diagnosis not present

## 2022-02-28 DIAGNOSIS — F319 Bipolar disorder, unspecified: Secondary | ICD-10-CM | POA: Diagnosis not present

## 2022-02-28 DIAGNOSIS — S72402A Unspecified fracture of lower end of left femur, initial encounter for closed fracture: Secondary | ICD-10-CM | POA: Diagnosis not present

## 2022-02-28 DIAGNOSIS — E440 Moderate protein-calorie malnutrition: Secondary | ICD-10-CM | POA: Insufficient documentation

## 2022-02-28 DIAGNOSIS — S82101K Unspecified fracture of upper end of right tibia, subsequent encounter for closed fracture with nonunion: Secondary | ICD-10-CM | POA: Diagnosis not present

## 2022-02-28 LAB — BASIC METABOLIC PANEL
Anion gap: 8 (ref 5–15)
BUN: 14 mg/dL (ref 8–23)
CO2: 27 mmol/L (ref 22–32)
Calcium: 8.6 mg/dL — ABNORMAL LOW (ref 8.9–10.3)
Chloride: 101 mmol/L (ref 98–111)
Creatinine, Ser: 0.69 mg/dL (ref 0.61–1.24)
GFR, Estimated: >60 mL/min (ref >60)
Glucose, Bld: 103 mg/dL — ABNORMAL HIGH (ref 70–99)
Potassium: 3.6 mmol/L (ref 3.5–5.1)
Sodium: 136 mmol/L (ref 135–145)

## 2022-02-28 LAB — CBC
HCT: 25.1 % — ABNORMAL LOW (ref 39.0–52.0)
Hemoglobin: 8.1 g/dL — ABNORMAL LOW (ref 13.0–17.0)
MCH: 34.2 pg — ABNORMAL HIGH (ref 26.0–34.0)
MCHC: 32.3 g/dL (ref 30.0–36.0)
MCV: 105.9 fL — ABNORMAL HIGH (ref 80.0–100.0)
Platelets: 438 10*3/uL — ABNORMAL HIGH (ref 150–400)
RBC: 2.37 MIL/uL — ABNORMAL LOW (ref 4.22–5.81)
RDW: 13.3 % (ref 11.5–15.5)
WBC: 6.6 10*3/uL (ref 4.0–10.5)
nRBC: 0 % (ref 0.0–0.2)

## 2022-02-28 NOTE — Progress Notes (Addendum)
Nutrition Follow-up  DOCUMENTATION CODES:  Non-severe (moderate) malnutrition in context of acute illness/injury  INTERVENTION:  Continue current diet as ordered, encourage PO intake Ensure Enlive po BID, each supplement provides 350 kcal and 20 grams of protein. 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 - remains applicable  GOAL:  Patient will meet greater than or equal to 90% of their needs - progressing, diet and supplements in place  MONITOR:  PO intake, Supplement acceptance  REASON FOR ASSESSMENT:  Consult Assessment of nutrition requirement/status, Hip fracture protocol  ASSESSMENT:  Pt with medical history significant for bipolar disorder, COPD, nocturnal oxygen requirement, alcohol abuse, and gout who has been admitted for evaluation of bilateral lower extremity fractures by orthopedic trauma specialist.  8/25 - Op, ORIF of the right tibia and left femur   Pt resting in bed, states that appetite has been good this admission. 100% of lunch consumed at bedside. Also reports drinking ensure supplements 1-2x/d this admission. Hopeful to be discharged soon. Will continue current interventions.  Average Meal Intake: 8/25-8/30: 88% intake x 7 recorded meals  Nutritionally Relevant Medications: Scheduled Meds:  bisacodyl  5 mg Oral Daily   cholecalciferol  1,000 Units Oral Daily   docusate sodium  100 mg Oral BID   feeding supplement  237 mL Oral BID BM   ferrous sulfate  325 mg Oral Daily   folic acid  1 mg Oral Daily   multivitamin with minerals  1 tablet Oral Daily   pantoprazole  40 mg Oral BID   sucralfate  1 g Oral TID AC & HS   thiamine  100 mg Oral Daily   Labs Reviewed   NUTRITION - FOCUSED PHYSICAL EXAM: Flowsheet Row Most Recent Value  Orbital Region Moderate depletion  Upper Arm Region Moderate  depletion  Thoracic and Lumbar Region Moderate depletion  Buccal Region Moderate depletion  Temple Region Mild depletion  Clavicle Bone Region Severe depletion  Clavicle and Acromion Bone Region Severe depletion  Scapular Bone Region Moderate depletion  Dorsal Hand Moderate depletion  Patellar Region Mild depletion  Anterior Thigh Region Mild depletion  Posterior Calf Region Moderate depletion  Edema (RD Assessment) None  Hair Reviewed  Eyes Reviewed  Mouth Reviewed  Skin Reviewed  Nails Reviewed   Diet Order:   Diet Order             Diet Heart Room service appropriate? Yes; Fluid consistency: Thin  Diet effective now                   EDUCATION NEEDS:  No education needs have been identified at this time  Skin:  Skin Assessment: Skin Integrity Issues: Skin Integrity Issues:: Other (Comment), Incisions Incisions: closed lt thigh, closed rt thigh Other: skin tear to lt lower arm  Last BM:  8/29 - type 4  Height:  Ht Readings from Last 1 Encounters:  02/21/22 5\' 11"  (1.803 m)    Weight:  Wt Readings from Last 1 Encounters:  02/21/22 71.2 kg    Ideal Body Weight:  78.2 kg  BMI:  Body mass index is 21.9 kg/m.  Estimated Nutritional Needs:  Kcal:  2150-2350 Protein:  105-120 grams Fluid:  > 2 L    06-07-1990, RD, LDN Clinical Dietitian RD pager # available in AMION  After hours/weekend pager # available in [facility]

## 2022-02-28 NOTE — Progress Notes (Addendum)
10:15am: Patient's PASRR # is [id number] E.  8:50am: CSW uploaded additional documents to [place] MUST for review.  Edwin Dada, MSW, LCSW Transitions of Care  Clinical Social Worker II [phone]

## 2022-02-28 NOTE — Progress Notes (Signed)
PROGRESS NOTE    Perry Bullock  ZOX:[id number] DOB: 08-11-1960 DOA: 02/21/2022 PCP: [facility], [facility]    No chief complaint on file.   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 He normally ambulates with a walker, was experiencing some pain and swelling in his foot that he attributed to a gout flare, and was having some difficulty ambulating due to this.  He reports stumbling and falling onto his knees, experiencing severe bilateral knee pain, and was unable to stand up on his own. [facility] ED and Hospital Course: Upon arrival to the ED, patient is found to be afebrile and saturating well on room air with slight tachycardia and stable blood pressure.  EKG was notable for RBBB and QTc of 500 ms. He was treated with prednisone and colchicine in the ED, was still unable to ambulate, and was admitted to the hospital.  Orthopedic surgery was consulted, plain radiographs of the bilateral knees were obtained, and bilateral fractures were noted.  These were further assessed with CT, demonstrating acute intra-articular fracture of the distal left femur with large lipohemarthrosis, as well as acute fracture of the right proximal tibia with intra-articular extension, and acute fibular head and neck fractures.Ortho Dr Emeline Gins [facility] discussed the case with orthopedic trauma specialist at [facility] Dr Carola Frost and transferred for evaluation. Patient underwent ORIF of right tibial plateau fracture, left distal femur fracture and removal of hardware from left femur by Dr Jena Gauss 02/21/22.  Hemoglobin dropped due to blood loss anemia needing 1 unit PRBC transfusion 8/27.  PT OT recommending CIR    Assessment & Plan:   Principal Problem:   Closed fracture of left distal femur (HCC) Active Problems:    Alcohol abuse   Thrombocytopenia (HCC)   Bipolar disorder (HCC)   COPD (chronic obstructive pulmonary disease) (HCC)   Macrocytic anemia   Closed fracture of right proximal tibia   Hyponatremia   Prolonged QT interval   Closed fracture of distal end of left femur (HCC)   Vitamin D deficiency  #1 distal left femur fracture/closed fracture right proximal tibia/status post fall -Patient seen in consultation by orthopedics status post ORIF right tibial plateau fracture, left distal femur fracture with removal of hardware from left femur per Dr. Jena Gauss 02/21/2022. -Pain management per orthopedics. -Eliquis 2.5 mg twice daily x30 days for DVT prophylaxis on discharge per orthopedics. -Continue vitamin D supplementation x90 days per orthopedics -Per orthopedics.  2.  Vitamin D deficiency -Vitamin D supplementation x90 days as recommended by orthopedics. -Outpatient follow-up with PCP.  3.  Acute blood loss anemia in the setting of hip fracture/surgery/chronic microcytic anemia -Vitamin B12 level of 354. -Folate level of 14.9. -Status post transfusion 1 unit packed RBC 02/23/2022 -Hemoglobin stable at 8.1. -Follow H&H. -Transfusion threshold hemoglobin < 7.  4.  Chronic hypoxic respiratory failure on nocturnal oxygen/COPD -Patient with improvement with wheezing.  -Continue Dulera, Incruse Ellipta. -Continue albuterol nebs as needed.    5.  Hyponatremia -Likely secondary to hypovolemic hyponatremia. -Resolved.  6.  Prolonged QT interval -Resolved.  7.  Alcohol abuse -Patient with no signs of withdrawal.   -Continue folic acid, multivitamin, thiamine.   8.  Bipolar disorder -Continue home regimen of Seroquel, trazodone, Ambien.  9.  Thrombocytopenia -Improved.     DVT prophylaxis: Lovenox Code Status: Full Family Communication: Updated patient.  No family at bedside. Disposition: Medically stable for discharge.  To SNF when bed available.  Status is: Inpatient Remains  inpatient appropriate because: Unsafe disposition   Consultants:  Orthopedics: Dr. Jena Gauss 02/21/2022  Procedures:  CT right knee/CT left knee 02/20/2022 Plain films of the left femur at 02/21/2022 Plain films of the right knee 02/21/2022, 02/19/2022 Plan films of the left knee 02/19/2022 ORIF of right tibial plateau fracture/ORIF of left distal femur fracture/removal of hardware from left femur per orthopedics: Dr. Jena Gauss 02/21/2022  Antimicrobials:  Anti-infectives (From admission, onward)    Start     Dose/Rate Route Frequency Ordered Stop   02/21/22 1845  ceFAZolin (ANCEF) IVPB 2g/100 mL premix        2 g 200 mL/hr over 30 Minutes Intravenous Every 8 hours 02/21/22 1415 02/22/22 0956   02/21/22 1140  vancomycin (VANCOCIN) powder  Status:  Discontinued          As needed 02/21/22 1140 02/21/22 1303   02/21/22 0900  ceFAZolin (ANCEF) IVPB 2g/100 mL premix        2 g 200 mL/hr over 30 Minutes Intravenous On call to O.R. 02/21/22 0804 02/21/22 1103         Subjective: Sitting up in bed.  Denies any chest pain.  No shortness of breath.  No abdominal pain.   Objective: Vitals:   02/27/22 1921 02/27/22 2051 02/28/22 0727 02/28/22 0733  BP: 104/65  90/64   Pulse: (!) 108 99 92   Resp:  15    Temp: 98.3 F (36.8 C)  98.8 F (37.1 C)   TempSrc: Oral  Oral   SpO2: (!) 89% 91% 94% 95%  Weight:      Height:        Intake/Output Summary (Last 24 hours) at 02/28/2022 1004 Last data filed at 02/28/2022 0529 Gross per 24 hour  Intake --  Output 1600 ml  Net -1600 ml    Filed Weights   02/21/22 0230 02/21/22 0848  Weight: 71.7 kg 71.2 kg    Examination:  General exam: NAD. Respiratory system: Some scattered coarse breath sounds.  No crackles, no rhonchi.  Fair air movement.  Speaking in full sentences.  Cardiovascular system: RRR no murmurs rubs or gallops.  No JVD.  No lower extremity edema.  Gastrointestinal system: Abdomen is soft, nontender, nondistended, positive bowel  sounds.  No rebound.  No guarding.   Central nervous system: Alert and oriented. No focal neurological deficits. Extremities: Right lower extremity in bandage.  Left lower extremity with dressing noted. Right lower extremity with trace to 1+ edema. Skin: No rashes, lesions or ulcers Psychiatry: Judgement and insight appear normal. Mood & affect appropriate.     Data Reviewed: I have personally reviewed following labs and imaging studies  CBC: Recent Labs  Lab 02/24/22 0434 02/25/22 0224 02/26/22 1017 02/27/22 0348 02/28/22 0627  WBC 8.7 8.4 7.0 6.6 6.6  NEUTROABS  --   --   --  3.7  --   HGB 8.6* 7.8* 8.1* 7.9* 8.1*  HCT 25.3* 22.5* 24.0* 23.4* 25.1*  MCV 103.3* 102.7* 103.9* 104.9* 105.9*  PLT 153 180 289 324 438*     Basic Metabolic Panel: Recent Labs  Lab 02/22/22 0424 02/23/22 0143 02/24/22 0434 02/27/22 0348 02/28/22 0627  NA 135 135 135 136 136  K 4.2 4.1 4.3 4.0 3.6  CL 101 99 103 101 101  CO2 27 27 26 29 27   GLUCOSE 140* 108* 105* 104* 103*  BUN 9  11 13 14 14   CREATININE 0.72 0.64 0.74 0.63 0.69  CALCIUM 8.1* 8.0* 8.2* 8.8* 8.6*     GFR: Estimated Creatinine Clearance: 97.7 mL/min (by C-G formula based on SCr of 0.69 mg/dL).  Liver Function Tests: No results for input(s): "AST", "ALT", "ALKPHOS", "BILITOT", "PROT", "ALBUMIN" in the last 168 hours.   CBG: Recent Labs  Lab 02/26/22 0414  GLUCAP 104*      No results found for this or any previous visit (from the past 240 hour(s)).       Radiology Studies: No results found.      Scheduled Meds:  acetaminophen  650 mg Oral Q6H   allopurinol  100 mg Oral Daily   bisacodyl  5 mg Oral Daily   cholecalciferol  1,000 Units Oral Daily   docusate sodium  100 mg Oral BID   enoxaparin (LOVENOX) injection  40 mg Subcutaneous Q24H   feeding supplement  237 mL Oral BID BM   ferrous sulfate  325 mg Oral Daily   folic acid  1 mg Oral Daily   gabapentin  300 mg Oral TID   latanoprost  1 drop  Both Eyes QHS   loratadine  10 mg Oral Daily   mometasone-formoterol  2 puff Inhalation BID   multivitamin with minerals  1 tablet Oral Daily   nicotine  21 mg Transdermal Daily   pantoprazole  40 mg Oral BID   QUEtiapine  150 mg Oral BID   sucralfate  1 g Oral TID AC & HS   thiamine  100 mg Oral Daily   Or   thiamine  100 mg Intravenous Daily   umeclidinium bromide  1 puff Inhalation Daily   zolpidem  10 mg Oral QHS   Continuous Infusions:  methocarbamol (ROBAXIN) IV       LOS: 7 days    Time spent: 35 minutes    02/28/22, MD [facility]   To contact the attending provider between 7A-7P or the covering provider during after hours 7P-7A, please log into the web site [email or web address] and access using universal [place] password for that web site. If you do not have the password, please call the hospital operator.  02/28/2022, 10:04 AM

## 2022-03-01 DIAGNOSIS — F319 Bipolar disorder, unspecified: Secondary | ICD-10-CM | POA: Diagnosis not present

## 2022-03-01 DIAGNOSIS — S82101K Unspecified fracture of upper end of right tibia, subsequent encounter for closed fracture with nonunion: Secondary | ICD-10-CM | POA: Diagnosis not present

## 2022-03-01 DIAGNOSIS — S72402A Unspecified fracture of lower end of left femur, initial encounter for closed fracture: Secondary | ICD-10-CM | POA: Diagnosis not present

## 2022-03-01 DIAGNOSIS — J449 Chronic obstructive pulmonary disease, unspecified: Secondary | ICD-10-CM | POA: Diagnosis not present

## 2022-03-01 MED ORDER — CYANOCOBALAMIN 1000 MCG/ML IJ SOLN
1000.0000 ug | Freq: Every day | INTRAMUSCULAR | Status: DC
  Administered 2022-03-01 – 2022-03-04 (×4): 1000 ug via SUBCUTANEOUS
  Filled 2022-03-01 (×4): qty 1

## 2022-03-01 NOTE — Progress Notes (Signed)
Physical Therapy Treatment Patient Details Name: Perry Bullock MRN: [id number] DOB: 1960-08-04 Today's Date: 03/01/2022   History of Present Illness 61 y.o.m admitted 8/25 for BIL LE fractures. S/p ORIF Lt distal femur, ORIF Rt proximal tibia. 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    PT Comments    Patient progressing towards physical therapy goals. Patient continues to have increased pain in R LE>LLE. Patient required modA+2 for sit to stand transfer and stand pivot transfer with use of RW. Able to perform seated LAQs bilaterally. Continue to recommend SNF for ongoing Physical Therapy.       Recommendations for follow up therapy are one component of a multi-disciplinary discharge planning process, led by the attending physician.  Recommendations may be updated based on patient status, additional functional criteria and insurance authorization.  Follow Up Recommendations  Skilled nursing-short term rehab (<3 hours/day) Can patient physically be transported by private vehicle: No   Assistance Recommended at Discharge Frequent or constant Supervision/Assistance  Patient can return home with the following A lot of help with bathing/dressing/bathroom;Two people to help with walking and/or transfers;Assistance with cooking/housework;Help with stairs or ramp for entrance;Assist for transportation   Equipment Recommendations  Other (comment) (TBD)    Recommendations for Other Services       Precautions / Restrictions Precautions Precautions: Fall Restrictions Weight Bearing Restrictions: Yes RLE Weight Bearing: Non weight bearing LLE Weight Bearing: Weight bearing as tolerated Other Position/Activity Restrictions: RLE can be WBAT for transfers only; NWB for ambulation     Mobility  Bed Mobility Overal bed mobility: Needs Assistance Bed Mobility: Supine to Sit     Supine to sit: Min guard     General bed  mobility comments: min guard for safety. Patient able to bring both LEs simultaneously over to EOB and reposition hips towards EOB    Transfers Overall transfer level: Needs assistance Equipment used: Rolling Taylah Dubiel (2 wheels) Transfers: Sit to/from Stand, Bed to chair/wheelchair/BSC Sit to Stand: Mod assist, +2 physical assistance, +2 safety/equipment Stand pivot transfers: Mod assist, +2 physical assistance         General transfer comment: modA+2 to stand from EOB with cues for hand placement. Able to take small steps towards recliner for transfer with modA+2 for balance and RW managemetn    Ambulation/Gait                   Stairs             Wheelchair Mobility    Modified Rankin (Stroke Patients Only)       Balance Overall balance assessment: Needs assistance Sitting-balance support: No upper extremity supported, Feet supported Sitting balance-Leahy Scale: Good     Standing balance support: Bilateral upper extremity supported, Reliant on assistive device for balance Standing balance-Leahy Scale: Poor                              Cognition Arousal/Alertness: Awake/alert Behavior During Therapy: WFL for tasks assessed/performed Overall Cognitive Status: Within Functional Limits for tasks assessed                                          Exercises General Exercises - Lower Extremity Long Arc Quad: 5 reps, Both, Seated Straight Leg Raises: Both, AAROM, 5 reps (long sitting)  General Comments General comments (skin integrity, edema, etc.): patient on 2L O2 on arrival but self doffed prior to mobility with spO2 >90% throughout      Pertinent Vitals/Pain Pain Assessment Pain Assessment: Faces Faces Pain Scale: Hurts whole lot Pain Location: RLE>LLE with stand pivot Pain Descriptors / Indicators: Constant, Grimacing, Moaning, Operative site guarding Pain Intervention(s): Monitored during session    Home Living                           Prior Function            PT Goals (current goals can now be found in the care plan section) Acute Rehab PT Goals PT Goal Formulation: With patient Time For Goal Achievement: 03/01/22 Potential to Achieve Goals: Good Progress towards PT goals: Progressing toward goals    Frequency    Min 3X/week      PT Plan Current plan remains appropriate    Co-evaluation              AM-PAC PT "6 Clicks" Mobility   Outcome Measure  Help needed turning from your back to your side while in a flat bed without using bedrails?: A Little Help needed moving from lying on your back to sitting on the side of a flat bed without using bedrails?: A Little Help needed moving to and from a bed to a chair (including a wheelchair)?: Total Help needed standing up from a chair using your arms (e.g., wheelchair or bedside chair)?: Total Help needed to walk in hospital room?: Total Help needed climbing 3-5 steps with a railing? : Total 6 Click Score: 10    End of Session Equipment Utilized During Treatment: Gait belt Activity Tolerance: Patient tolerated treatment well Patient left: in chair;with call bell/phone within reach;with chair alarm set Nurse Communication: Mobility status PT Visit Diagnosis: History of falling (Z91.81);Difficulty in walking, not elsewhere classified (R26.2);Pain Pain - Right/Left: Right Pain - part of body: Leg     Time: 9675-9163 PT Time Calculation (min) (ACUTE ONLY): 19 min  Charges:  $Therapeutic Activity: 8-22 mins                     Kadelyn Dimascio A. Dan Humphreys PT, DPT Acute Rehabilitation Services Office [phone]    Viviann Spare 03/01/2022, 1:24 PM

## 2022-03-01 NOTE — Progress Notes (Signed)
PROGRESS NOTE    Perry Bullock  ZES:[id number] DOB: 1961/05/30 DOA: 02/21/2022 PCP: [facility], [facility]    No chief complaint on file.   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 He normally ambulates with a walker, was experiencing some pain and swelling in his foot that he attributed to a gout flare, and was having some difficulty ambulating due to this.  He reports stumbling and falling onto his knees, experiencing severe bilateral knee pain, and was unable to stand up on his own. [facility] ED and Hospital Course: Upon arrival to the ED, patient is found to be afebrile and saturating well on room air with slight tachycardia and stable blood pressure.  EKG was notable for RBBB and QTc of 500 ms. He was treated with prednisone and colchicine in the ED, was still unable to ambulate, and was admitted to the hospital.  Orthopedic surgery was consulted, plain radiographs of the bilateral knees were obtained, and bilateral fractures were noted.  These were further assessed with CT, demonstrating acute intra-articular fracture of the distal left femur with large lipohemarthrosis, as well as acute fracture of the right proximal tibia with intra-articular extension, and acute fibular head and neck fractures.Ortho Dr Emeline Gins [facility] discussed the case with orthopedic trauma specialist at [facility] Dr Carola Frost and transferred for evaluation. Patient underwent ORIF of right tibial plateau fracture, left distal femur fracture and removal of hardware from left femur by Dr Jena Gauss 02/21/22.  Hemoglobin dropped due to blood loss anemia needing 1 unit PRBC transfusion 8/27.  PT OT recommending CIR    Assessment & Plan:   Principal Problem:   Closed fracture of left distal femur (HCC) Active Problems:    Alcohol abuse   Thrombocytopenia (HCC)   Bipolar disorder (HCC)   COPD (chronic obstructive pulmonary disease) (HCC)   Macrocytic anemia   Closed fracture of right proximal tibia   Hyponatremia   Prolonged QT interval   Closed fracture of distal end of left femur (HCC)   Vitamin D deficiency   Malnutrition of moderate degree  #1 distal left femur fracture/closed fracture right proximal tibia/status post fall -Patient seen in consultation by orthopedics status post ORIF right tibial plateau fracture, left distal femur fracture with removal of hardware from left femur per Dr. Jena Gauss 02/21/2022. -Pain management per orthopedics. -Eliquis 2.5 mg twice daily x30 days for DVT prophylaxis on discharge per orthopedics. -Continue vitamin D supplementation x90 days per orthopedics -Per orthopedics.  2.  Vitamin D deficiency -Vitamin D supplementation x90 days as recommended by orthopedics. -Outpatient follow-up with PCP.  3.  Acute blood loss anemia in the setting of hip fracture/surgery/chronic microcytic anemia -Vitamin B12 level of 354. -Folate level of 14.9. -Status post transfusion 1 unit packed RBC 02/23/2022 -Hemoglobin stable at 8.1. -Vitamin B12 1000 mcg subcu daily x7 days, then weekly x1 month, then monthly.  Could likely transition to oral vitamin B12 supplementation on discharge. -Follow H&H. -Transfusion threshold hemoglobin < 7.  4.  Chronic hypoxic respiratory failure on nocturnal oxygen/COPD -Patient with improvement with wheezing.  -Continue Dulera, Incruse Ellipta. -Continue albuterol nebs as needed.    5.  Hyponatremia -Likely secondary to hypovolemic hyponatremia. -Resolved.  6.  Prolonged QT interval -Resolved.  7.  Alcohol abuse -Patient with no signs of withdrawal.   -Continue folic acid, multivitamin, thiamine.  8.  Bipolar disorder -Continue trazodone, Seroquel, Ambien.   9.  Thrombocytopenia -Improved.     DVT prophylaxis: Lovenox Code Status:  Full Family Communication: Updated patient.  No family at bedside. Disposition: Medically stable for discharge.  Awaiting SNF placement.   Status is: Inpatient Remains inpatient appropriate because: Unsafe disposition   Consultants:  Orthopedics: Dr. Jena Gauss 02/21/2022  Procedures:  CT right knee/CT left knee 02/20/2022 Plain films of the left femur at 02/21/2022 Plain films of the right knee 02/21/2022, 02/19/2022 Plan films of the left knee 02/19/2022 ORIF of right tibial plateau fracture/ORIF of left distal femur fracture/removal of hardware from left femur per orthopedics: Dr. Jena Gauss 02/21/2022  Antimicrobials:  Anti-infectives (From admission, onward)    Start     Dose/Rate Route Frequency Ordered Stop   02/21/22 1845  ceFAZolin (ANCEF) IVPB 2g/100 mL premix        2 g 200 mL/hr over 30 Minutes Intravenous Every 8 hours 02/21/22 1415 02/22/22 0956   02/21/22 1140  vancomycin (VANCOCIN) powder  Status:  Discontinued          As needed 02/21/22 1140 02/21/22 1303   02/21/22 0900  ceFAZolin (ANCEF) IVPB 2g/100 mL premix        2 g 200 mL/hr over 30 Minutes Intravenous On call to O.R. 02/21/22 0804 02/21/22 1103         Subjective: Just finished inhalewrs per patient. C/o R knee pain. No SOB, No CP.   Objective: Vitals:   02/28/22 2035 02/28/22 2116 03/01/22 0746 03/01/22 0850  BP: 105/71  104/66   Pulse: 99 95 91   Resp: 16 15 15 16   Temp: 98.9 F (37.2 C)  98.1 F (36.7 C)   TempSrc: Oral  Oral   SpO2: 94% 95% 95% 95%  Weight:      Height:        Intake/Output Summary (Last 24 hours) at 03/01/2022 0935 Last data filed at 03/01/2022 0500 Gross per 24 hour  Intake --  Output 2950 ml  Net -2950 ml    Filed Weights   02/21/22 0230 02/21/22 0848  Weight: 71.7 kg 71.2 kg    Examination:  General exam: NAD. Respiratory system: Some scattered coarse breath sounds.  No wheezes, no crackles, no rhonchi.  Fair air movement.  Cardiovascular system: Regular rate rhythm  no murmurs rubs or gallops.  No JVD.  No lower extremity edema. Gastrointestinal system: Abdomen is soft, nontender, nondistended, positive bowel sounds.  No rebound.  No guarding.   Central nervous system: Alert and oriented. No focal neurological deficits. Extremities: Right lower extremity in bandage.  Left lower extremity with dressing noted. Right lower extremity with trace to 1+ edema. Skin: No rashes, lesions or ulcers Psychiatry: Judgement and insight appear normal. Mood & affect appropriate.     Data Reviewed: I have personally reviewed following labs and imaging studies  CBC: Recent Labs  Lab 02/24/22 0434 02/25/22 0224 02/26/22 1017 02/27/22 0348 02/28/22 0627  WBC 8.7 8.4 7.0 6.6 6.6  NEUTROABS  --   --   --  3.7  --   HGB 8.6* 7.8* 8.1* 7.9* 8.1*  HCT 25.3* 22.5* 24.0* 23.4* 25.1*  MCV 103.3* 102.7* 103.9* 104.9* 105.9*  PLT 153 180 289 324 438*     Basic Metabolic Panel: Recent Labs  Lab 02/23/22 0143 02/24/22 0434 02/27/22 0348 02/28/22 0627  NA 135 135 136 136  K 4.1 4.3 4.0 3.6  CL 99 103 101 101  CO2 27  26 29 27   GLUCOSE 108* 105* 104* 103*  BUN 11 13 14 14   CREATININE 0.64 0.74 0.63 0.69  CALCIUM 8.0* 8.2* 8.8* 8.6*     GFR: Estimated Creatinine Clearance: 97.7 mL/min (by C-G formula based on SCr of 0.69 mg/dL).  Liver Function Tests: No results for input(s): "AST", "ALT", "ALKPHOS", "BILITOT", "PROT", "ALBUMIN" in the last 168 hours.   CBG: Recent Labs  Lab 02/26/22 0414  GLUCAP 104*      No results found for this or any previous visit (from the past 240 hour(s)).       Radiology Studies: No results found.      Scheduled Meds:  acetaminophen  650 mg Oral Q6H   allopurinol  100 mg Oral Daily   bisacodyl  5 mg Oral Daily   cholecalciferol  1,000 Units Oral Daily   docusate sodium  100 mg Oral BID   enoxaparin (LOVENOX) injection  40 mg Subcutaneous Q24H   feeding supplement  237 mL Oral BID BM   ferrous sulfate  325  mg Oral Daily   folic acid  1 mg Oral Daily   gabapentin  300 mg Oral TID   latanoprost  1 drop Both Eyes QHS   loratadine  10 mg Oral Daily   mometasone-formoterol  2 puff Inhalation BID   multivitamin with minerals  1 tablet Oral Daily   nicotine  21 mg Transdermal Daily   pantoprazole  40 mg Oral BID   QUEtiapine  150 mg Oral BID   sucralfate  1 g Oral TID AC & HS   thiamine  100 mg Oral Daily   Or   thiamine  100 mg Intravenous Daily   umeclidinium bromide  1 puff Inhalation Daily   zolpidem  10 mg Oral QHS   Continuous Infusions:  methocarbamol (ROBAXIN) IV       LOS: 8 days    Time spent: 35 minutes    , MD [facility]   To contact the attending provider between 7A-7P or the covering provider during after hours 7P-7A, please log into the web site [email or web address] and access using universal [place] password for that web site. If you do not have the password, please call the hospital operator.  03/01/2022, 9:35 AM

## 2022-03-02 DIAGNOSIS — F319 Bipolar disorder, unspecified: Secondary | ICD-10-CM | POA: Diagnosis not present

## 2022-03-02 DIAGNOSIS — J449 Chronic obstructive pulmonary disease, unspecified: Secondary | ICD-10-CM | POA: Diagnosis not present

## 2022-03-02 DIAGNOSIS — S82101K Unspecified fracture of upper end of right tibia, subsequent encounter for closed fracture with nonunion: Secondary | ICD-10-CM | POA: Diagnosis not present

## 2022-03-02 DIAGNOSIS — S72402A Unspecified fracture of lower end of left femur, initial encounter for closed fracture: Secondary | ICD-10-CM | POA: Diagnosis not present

## 2022-03-02 NOTE — Progress Notes (Signed)
PROGRESS NOTE    Perry Bullock  PXT:[id number] DOB: Oct 08, 1960 DOA: 02/21/2022 PCP: [facility], [facility]    No chief complaint on file.   Brief Narrative:  61 y.o.m w/ history significant for bipolar disorder, COPD on nocturnal oxygen,alcohol abuse, and gout who has been transferred from [facility] for evaluation of bilateral lower extremity fractures by orthopedic trauma specialist. He normally ambulates with a walker, was experiencing some pain and swelling in his foot that he attributed to a gout flare, and was having some difficulty ambulating due to this.  He reports stumbling and falling onto his knees, experiencing severe bilateral knee pain, and was unable to stand up on his own. [facility] ED and Hospital Course: Upon arrival to the ED, patient is found to be afebrile and saturating well on room air with slight tachycardia and stable blood pressure.  EKG was notable for RBBB and QTc of 500 ms. He was treated with prednisone and colchicine in the ED, was still unable to ambulate, and was admitted to the hospital.  Orthopedic surgery was consulted, plain radiographs of the bilateral knees were obtained, and bilateral fractures were noted.  These were further assessed with CT, demonstrating acute intra-articular fracture of the distal left femur with large lipohemarthrosis, as well as acute fracture of the right proximal tibia with intra-articular extension, and acute fibular head and neck fractures.Ortho Dr Emeline Gins [facility] discussed the case with orthopedic trauma specialist at [facility] Dr Carola Frost and transferred for evaluation. Patient underwent ORIF of right tibial plateau fracture, left distal femur fracture and removal of hardware from left femur by Dr Jena Gauss 02/21/22.  Hemoglobin dropped due to blood loss anemia needing 1 unit PRBC transfusion 8/27.  PT OT recommending CIR    Assessment & Plan:   Principal Problem:   Closed fracture of left distal femur (HCC) Active Problems:    Alcohol abuse   Thrombocytopenia (HCC)   Bipolar disorder (HCC)   COPD (chronic obstructive pulmonary disease) (HCC)   Macrocytic anemia   Closed fracture of right proximal tibia   Hyponatremia   Prolonged QT interval   Closed fracture of distal end of left femur (HCC)   Vitamin D deficiency   Malnutrition of moderate degree  #1 distal left femur fracture/closed fracture right proximal tibia/status post fall -Patient seen in consultation by orthopedics status post ORIF right tibial plateau fracture, left distal femur fracture with removal of hardware from left femur per Dr. Jena Gauss 02/21/2022. -Pain management per orthopedics. -Eliquis 2.5 mg twice daily x30 days for DVT prophylaxis on discharge per orthopedics. -Continue vitamin D supplementation x90 days per orthopedics -Per orthopedics. -Awaiting SNF placement.  2.  Vitamin D deficiency -Vitamin D supplementation x90 days as recommended by orthopedics. -Outpatient follow-up with PCP.  3.  Acute blood loss anemia in the setting of hip fracture/surgery/chronic microcytic anemia -Vitamin B12 level of 354. -Folate level of 14.9. -Status post transfusion 1 unit packed RBC 02/23/2022 -Hemoglobin stable at 8.1. -Continue Vitamin B12 1000 mcg subcu daily x 6days, then weekly x1 month, then monthly.  Could likely transition to oral vitamin B12 supplementation on discharge. -Follow H&H. -Transfusion threshold hemoglobin < 7.  4.  Chronic hypoxic respiratory failure on nocturnal oxygen/COPD -Patient with improvement with wheezing.  -Continue Dulera, Incruse Ellipta. -Continue albuterol nebs as needed.    5.  Hyponatremia -Likely secondary to hypovolemic hyponatremia. -Resolved.  6.  Prolonged QT interval -Resolved.  7.  Alcohol abuse -Patient with no signs of withdrawal.   -Continue folic  acid, multivitamin, thiamine.   8.  Bipolar disorder -Continue Seroquel, Ambien, trazodone.   9.  Thrombocytopenia -Improved.     DVT  prophylaxis: Lovenox Code Status: Full Family Communication: Updated patient.  No family at bedside. Disposition: Medically stable for discharge.  Awaiting SNF placement.   Status is: Inpatient Remains inpatient appropriate because: Unsafe disposition   Consultants:  Orthopedics: Dr. Jena Gauss 02/21/2022  Procedures:  CT right knee/CT left knee 02/20/2022 Plain films of the left femur at 02/21/2022 Plain films of the right knee 02/21/2022, 02/19/2022 Plan films of the left knee 02/19/2022 ORIF of right tibial plateau fracture/ORIF of left distal femur fracture/removal of hardware from left femur per orthopedics: Dr. Jena Gauss 02/21/2022  Antimicrobials:  Anti-infectives (From admission, onward)    Start     Dose/Rate Route Frequency Ordered Stop   02/21/22 1845  ceFAZolin (ANCEF) IVPB 2g/100 mL premix        2 g 200 mL/hr over 30 Minutes Intravenous Every 8 hours 02/21/22 1415 02/22/22 0956   02/21/22 1140  vancomycin (VANCOCIN) powder  Status:  Discontinued          As needed 02/21/22 1140 02/21/22 1303   02/21/22 0900  ceFAZolin (ANCEF) IVPB 2g/100 mL premix        2 g 200 mL/hr over 30 Minutes Intravenous On call to O.R. 02/21/22 0804 02/21/22 1103         Subjective: Sitting on bedside commode having a bowel movement.  Denies any chest pain.  No significant shortness of breath.  States he is yet to receive his inhalers this morning.  Complains of significant weakness when going from bed to bedside commode.    Objective: Vitals:   03/01/22 0850 03/01/22 2127 03/01/22 2217 03/02/22 0832  BP:   106/69 (!) 89/66  Pulse:   90 100  Resp: 16   16  Temp:   98.4 F (36.9 C) 99.2 F (37.3 C)  TempSrc:   Oral Oral  SpO2: 95% 98% 100% 93%  Weight:      Height:        Intake/Output Summary (Last 24 hours) at 03/02/2022 0959 Last data filed at 03/02/2022 0900 Gross per 24 hour  Intake 600 ml  Output 2000 ml  Net -1400 ml    Filed Weights   02/21/22 0230 02/21/22 0848  Weight: 71.7  kg 71.2 kg    Examination:  General exam: NAD. Respiratory system: Coarse scattered breath sounds.  No wheezing.  No crackles.  Fair air movement.   Cardiovascular system: RRR no murmurs rubs or gallops.  No JVD.  Trace to 1+ right lower extremity edema. Gastrointestinal system: Abdomen is soft, nontender, nondistended, positive bowel sounds.  No rebound.  No guarding.   Central nervous system: Alert and oriented. No focal neurological deficits. Extremities: Right lower extremity in bandage.  Left lower extremity with dressing noted. Right lower extremity with trace to 1+ edema. Skin: No rashes, lesions or ulcers Psychiatry: Judgement and insight appear normal. Mood & affect appropriate.     Data Reviewed: I have personally reviewed following labs and imaging studies  CBC: Recent Labs  Lab 02/24/22 0434 02/25/22 0224 02/26/22 1017 02/27/22 0348 02/28/22 0627  WBC 8.7 8.4 7.0 6.6 6.6  NEUTROABS  --   --   --  3.7  --   HGB 8.6* 7.8* 8.1* 7.9* 8.1*  HCT 25.3* 22.5* 24.0* 23.4* 25.1*  MCV 103.3* 102.7* 103.9* 104.9* 105.9*  PLT 153 180 289 324 438*  Basic Metabolic Panel: Recent Labs  Lab 02/24/22 0434 02/27/22 0348 02/28/22 0627  NA 135 136 136  K 4.3 4.0 3.6  CL 103 101 101  CO2 26 29 27   GLUCOSE 105* 104* 103*  BUN 13 14 14   CREATININE 0.74 0.63 0.69  CALCIUM 8.2* 8.8* 8.6*     GFR: Estimated Creatinine Clearance: 97.7 mL/min (by C-G formula based on SCr of 0.69 mg/dL).  Liver Function Tests: No results for input(s): "AST", "ALT", "ALKPHOS", "BILITOT", "PROT", "ALBUMIN" in the last 168 hours.   CBG: Recent Labs  Lab 02/26/22 0414  GLUCAP 104*      No results found for this or any previous visit (from the past 240 hour(s)).       Radiology Studies: No results found.      Scheduled Meds:  acetaminophen  650 mg Oral Q6H   allopurinol  100 mg Oral Daily   bisacodyl  5 mg Oral Daily   cholecalciferol  1,000 Units Oral Daily    cyanocobalamin  1,000 mcg Subcutaneous Daily   docusate sodium  100 mg Oral BID   enoxaparin (LOVENOX) injection  40 mg Subcutaneous Q24H   feeding supplement  237 mL Oral BID BM   ferrous sulfate  325 mg Oral Daily   folic acid  1 mg Oral Daily   gabapentin  300 mg Oral TID   latanoprost  1 drop Both Eyes QHS   loratadine  10 mg Oral Daily   mometasone-formoterol  2 puff Inhalation BID   multivitamin with minerals  1 tablet Oral Daily   nicotine  21 mg Transdermal Daily   pantoprazole  40 mg Oral BID   QUEtiapine  150 mg Oral BID   sucralfate  1 g Oral TID AC & HS   thiamine  100 mg Oral Daily   Or   thiamine  100 mg Intravenous Daily   umeclidinium bromide  1 puff Inhalation Daily   zolpidem  10 mg Oral QHS   Continuous Infusions:  methocarbamol (ROBAXIN) IV       LOS: 9 days    Time spent: 35 minutes    , MD [facility]   To contact the attending provider between 7A-7P or the covering provider during after hours 7P-7A, please log into the web site [email or web address] and access using universal [place] password for that web site. If you do not have the password, please call the hospital operator.  03/02/2022, 9:59 AM

## 2022-03-03 DIAGNOSIS — J449 Chronic obstructive pulmonary disease, unspecified: Secondary | ICD-10-CM | POA: Diagnosis not present

## 2022-03-03 DIAGNOSIS — S72402A Unspecified fracture of lower end of left femur, initial encounter for closed fracture: Secondary | ICD-10-CM | POA: Diagnosis not present

## 2022-03-03 DIAGNOSIS — F319 Bipolar disorder, unspecified: Secondary | ICD-10-CM | POA: Diagnosis not present

## 2022-03-03 DIAGNOSIS — S82101K Unspecified fracture of upper end of right tibia, subsequent encounter for closed fracture with nonunion: Secondary | ICD-10-CM | POA: Diagnosis not present

## 2022-03-03 LAB — CBC
HCT: 25.2 % — ABNORMAL LOW (ref 39.0–52.0)
Hemoglobin: 8.3 g/dL — ABNORMAL LOW (ref 13.0–17.0)
MCH: 34.3 pg — ABNORMAL HIGH (ref 26.0–34.0)
MCHC: 32.9 g/dL (ref 30.0–36.0)
MCV: 104.1 fL — ABNORMAL HIGH (ref 80.0–100.0)
Platelets: 614 10*3/uL — ABNORMAL HIGH (ref 150–400)
RBC: 2.42 MIL/uL — ABNORMAL LOW (ref 4.22–5.81)
RDW: 13.2 % (ref 11.5–15.5)
WBC: 7.6 10*3/uL (ref 4.0–10.5)
nRBC: 0 % (ref 0.0–0.2)

## 2022-03-03 LAB — URINALYSIS, ROUTINE W REFLEX MICROSCOPIC
Bacteria, UA: NONE SEEN
Bilirubin Urine: NEGATIVE
Glucose, UA: NEGATIVE mg/dL
Hgb urine dipstick: NEGATIVE
Ketones, ur: NEGATIVE mg/dL
Nitrite: NEGATIVE
Protein, ur: NEGATIVE mg/dL
Specific Gravity, Urine: 1.006 (ref 1.005–1.030)
pH: 7 (ref 5.0–8.0)

## 2022-03-03 LAB — BASIC METABOLIC PANEL
Anion gap: 8 (ref 5–15)
BUN: 14 mg/dL (ref 8–23)
CO2: 25 mmol/L (ref 22–32)
Calcium: 8.4 mg/dL — ABNORMAL LOW (ref 8.9–10.3)
Chloride: 104 mmol/L (ref 98–111)
Creatinine, Ser: 0.67 mg/dL (ref 0.61–1.24)
GFR, Estimated: >60 mL/min (ref >60)
Glucose, Bld: 96 mg/dL (ref 70–99)
Potassium: 3.9 mmol/L (ref 3.5–5.1)
Sodium: 137 mmol/L (ref 135–145)

## 2022-03-03 NOTE — Progress Notes (Signed)
PROGRESS NOTE    Perry Bullock  DXI:[id number] DOB: 29-May-1961 DOA: 02/21/2022 PCP: [facility], [facility]    No chief complaint on file.   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 He normally ambulates with a walker, was experiencing some pain and swelling in his foot that he attributed to a gout flare, and was having some difficulty ambulating due to this.  He reports stumbling and falling onto his knees, experiencing severe bilateral knee pain, and was unable to stand up on his own. [facility] ED and Hospital Course: Upon arrival to the ED, patient is found to be afebrile and saturating well on room air with slight tachycardia and stable blood pressure.  EKG was notable for RBBB and QTc of 500 ms. He was treated with prednisone and colchicine in the ED, was still unable to ambulate, and was admitted to the hospital.  Orthopedic surgery was consulted, plain radiographs of the bilateral knees were obtained, and bilateral fractures were noted.  These were further assessed with CT, demonstrating acute intra-articular fracture of the distal left femur with large lipohemarthrosis, as well as acute fracture of the right proximal tibia with intra-articular extension, and acute fibular head and neck fractures.Ortho Dr Emeline Gins [facility] discussed the case with orthopedic trauma specialist at [facility] Dr Carola Frost and transferred for evaluation. Patient underwent ORIF of right tibial plateau fracture, left distal femur fracture and removal of hardware from left femur by Dr Jena Gauss 02/21/22.  Hemoglobin dropped due to blood loss anemia needing 1 unit PRBC transfusion 8/27.  PT OT recommending CIR    Assessment & Plan:   Principal Problem:   Closed fracture of left distal femur (HCC) Active Problems:    Alcohol abuse   Thrombocytopenia (HCC)   Bipolar disorder (HCC)   COPD (chronic obstructive pulmonary disease) (HCC)   Macrocytic anemia   Closed fracture of right proximal tibia   Hyponatremia   Prolonged QT interval   Closed fracture of distal end of left femur (HCC)   Vitamin D deficiency   Malnutrition of moderate degree  1 distal left femur fracture/closed fracture right proximal tibia/status post fall -Patient seen in consultation by orthopedics status post ORIF right tibial plateau fracture, left distal femur fracture with removal of hardware from left femur per Dr. Jena Gauss 02/21/2022. -Pain management per orthopedics. -Eliquis 2.5 mg twice daily x 30 days for DVT prophylaxis on discharge per orthopedics. -Continue vitamin D supplementation x 90 days per orthopedics -Per orthopedics. -Awaiting SNF placement.  2.  Vitamin D deficiency -Vitamin D supplementation x 90 days as recommended by orthopedics. -Outpatient follow-up with PCP.  3.  Acute blood loss anemia in the setting of hip fracture/surgery/chronic microcytic anemia -Vitamin B12 level of 354. -Folate level of 14.9. -Status post transfusion 1 unit packed RBC 02/23/2022 -Hemoglobin stable at 8.3. -Continue Vitamin B12 1000 mcg subcu daily x 5 days, then weekly x1 month, then monthly.  Could likely transition to oral vitamin B12 supplementation on discharge. -Follow H&H. -Transfusion threshold hemoglobin < 7.  4.  Chronic hypoxic respiratory failure on nocturnal oxygen/COPD -Patient with improvement with wheezing.  -Continue Dulera, Incruse Ellipta. -Continue albuterol nebs as needed.    5.  Hyponatremia -Likely secondary to hypovolemic hyponatremia. -Resolved.  6.  Prolonged QT interval -Resolved.  7.  Alcohol abuse -Patient with no signs of withdrawal.   -  Continue folic acid, multivitamin, thiamine.   8.  Bipolar disorder -Continue Seroquel, trazodone, Ambien.   9.  Thrombocytopenia -Improved.  10.   Dysuria -Check a UA with cultures and sensitivities. -Hold off on antibiotics for now pending urinalysis.     DVT prophylaxis: Lovenox Code Status: Full Family Communication: Updated patient.  No family at bedside. Disposition: Medically stable for discharge.  Awaiting SNF placement.   Status is: Inpatient Remains inpatient appropriate because: Unsafe disposition   Consultants:  Orthopedics: Dr. Jena Gauss 02/21/2022  Procedures:  CT right knee/CT left knee 02/20/2022 Plain films of the left femur at 02/21/2022 Plain films of the right knee 02/21/2022, 02/19/2022 Plan films of the left knee 02/19/2022 ORIF of right tibial plateau fracture/ORIF of left distal femur fracture/removal of hardware from left femur per orthopedics: Dr. Jena Gauss 02/21/2022  Antimicrobials:  Anti-infectives (From admission, onward)    Start     Dose/Rate Route Frequency Ordered Stop   02/21/22 1845  ceFAZolin (ANCEF) IVPB 2g/100 mL premix        2 g 200 mL/hr over 30 Minutes Intravenous Every 8 hours 02/21/22 1415 02/22/22 0956   02/21/22 1140  vancomycin (VANCOCIN) powder  Status:  Discontinued          As needed 02/21/22 1140 02/21/22 1303   02/21/22 0900  ceFAZolin (ANCEF) IVPB 2g/100 mL premix        2 g 200 mL/hr over 30 Minutes Intravenous On call to O.R. 02/21/22 0804 02/21/22 1103         Subjective: Patient sitting up in bed. C/o foul smelling urine and some intermittent dysuria. No CP. No significant SOB.  Objective: Vitals:   03/02/22 1014 03/02/22 2006 03/02/22 2130 03/03/22 0746  BP:   112/76 115/74  Pulse:   78 81  Resp:   18 18  Temp:   98.6 F (37 C) 98.7 F (37.1 C)  TempSrc:   Oral   SpO2: 94% 97% 98% 100%  Weight:      Height:        Intake/Output Summary (Last 24 hours) at 03/03/2022 0936 Last data filed at 03/02/2022 1425 Gross per 24 hour  Intake --  Output 600 ml  Net -600 ml    Filed Weights   02/21/22 0230 02/21/22 0848  Weight: 71.7 kg 71.2 kg     Examination:  General exam: NAD. Respiratory system: Scattered coarse breath sounds.  No significant wheezing noted.  No crackles.  Fair air movement.  Speaking in full sentences.  Cardiovascular system: Regular rate and rhythm no murmurs rubs or gallops.  No JVD.  Trace right lower extremity edema.  Gastrointestinal system: Abdomen soft, nontender, nondistended, positive bowel sounds.  No rebound.  No guarding.  Central nervous system: Alert and oriented. No focal neurological deficits. Extremities: Right lower extremity with dressing intact. Ecchymosis, some TTP. in bandage.  Left lower extremity with dressing noted.  Trace right lower extremity edema. Skin: No rashes, lesions or ulcers Psychiatry: Judgement and insight appear normal. Mood & affect appropriate.     Data Reviewed: I have personally reviewed following labs and imaging studies  CBC: Recent Labs  Lab 02/25/22 0224 02/26/22 1017 02/27/22 0348 02/28/22 0627 03/03/22 0359  WBC 8.4 7.0 6.6 6.6 7.6  NEUTROABS  --   --  3.7  --   --   HGB 7.8* 8.1* 7.9* 8.1* 8.3*  HCT 22.5* 24.0* 23.4* 25.1* 25.2*  MCV 102.7* 103.9* 104.9* 105.9* 104.1*  PLT 180 289 324 438* 614*  Basic Metabolic Panel: Recent Labs  Lab 02/27/22 0348 02/28/22 0627 03/03/22 0359  NA 136 136 137  K 4.0 3.6 3.9  CL 101 101 104  CO2 29 27 25   GLUCOSE 104* 103* 96  BUN 14 14 14   CREATININE 0.63 0.69 0.67  CALCIUM 8.8* 8.6* 8.4*     GFR: Estimated Creatinine Clearance: 97.7 mL/min (by C-G formula based on SCr of 0.67 mg/dL).  Liver Function Tests: No results for input(s): "AST", "ALT", "ALKPHOS", "BILITOT", "PROT", "ALBUMIN" in the last 168 hours.   CBG: Recent Labs  Lab 02/26/22 0414  GLUCAP 104*      No results found for this or any previous visit (from the past 240 hour(s)).       Radiology Studies: No results found.      Scheduled Meds:  acetaminophen  650 mg Oral Q6H   allopurinol  100 mg Oral Daily    bisacodyl  5 mg Oral Daily   cholecalciferol  1,000 Units Oral Daily   cyanocobalamin  1,000 mcg Subcutaneous Daily   docusate sodium  100 mg Oral BID   enoxaparin (LOVENOX) injection  40 mg Subcutaneous Q24H   feeding supplement  237 mL Oral BID BM   ferrous sulfate  325 mg Oral Daily   folic acid  1 mg Oral Daily   gabapentin  300 mg Oral TID   latanoprost  1 drop Both Eyes QHS   loratadine  10 mg Oral Daily   mometasone-formoterol  2 puff Inhalation BID   multivitamin with minerals  1 tablet Oral Daily   nicotine  21 mg Transdermal Daily   pantoprazole  40 mg Oral BID   QUEtiapine  150 mg Oral BID   sucralfate  1 g Oral TID AC & HS   thiamine  100 mg Oral Daily   Or   thiamine  100 mg Intravenous Daily   umeclidinium bromide  1 puff Inhalation Daily   zolpidem  10 mg Oral QHS   Continuous Infusions:  methocarbamol (ROBAXIN) IV       LOS: 10 days    Time spent: 35 minutes    , MD [facility]   To contact the attending provider between 7A-7P or the covering provider during after hours 7P-7A, please log into the web site [email or web address] and access using universal [place] password for that web site. If you do not have the password, please call the hospital operator.  03/03/2022, 9:36 AM

## 2022-03-04 DIAGNOSIS — S72402A Unspecified fracture of lower end of left femur, initial encounter for closed fracture: Secondary | ICD-10-CM | POA: Diagnosis not present

## 2022-03-04 DIAGNOSIS — F319 Bipolar disorder, unspecified: Secondary | ICD-10-CM | POA: Diagnosis not present

## 2022-03-04 DIAGNOSIS — R300 Dysuria: Secondary | ICD-10-CM

## 2022-03-04 DIAGNOSIS — J449 Chronic obstructive pulmonary disease, unspecified: Secondary | ICD-10-CM | POA: Diagnosis not present

## 2022-03-04 DIAGNOSIS — F1010 Alcohol abuse, uncomplicated: Secondary | ICD-10-CM | POA: Diagnosis not present

## 2022-03-04 MED ORDER — VITAMIN B-12 1000 MCG PO TABS: 1000.0000 ug | ORAL_TABLET | Freq: Every day | ORAL | 0 refills | Status: AC

## 2022-03-04 MED ORDER — DOCUSATE SODIUM 100 MG PO CAPS: 100.0000 mg | ORAL_CAPSULE | Freq: Two times a day (BID) | ORAL | 0 refills | Status: DC

## 2022-03-04 MED ORDER — FOSFOMYCIN TROMETHAMINE 3 G PO PACK
3.0000 g | PACK | Freq: Once | ORAL | Status: AC
Start: 2022-03-04 — End: 2022-03-04
  Administered 2022-03-04: 3 g via ORAL
  Filled 2022-03-04: qty 3

## 2022-03-04 MED ORDER — FOLIC ACID 1 MG PO TABS: 1.0000 mg | ORAL_TABLET | Freq: Every day | ORAL | Status: DC

## 2022-03-04 MED ORDER — POLYVINYL ALCOHOL 1.4 % OP SOLN: 2.0000 [drp] | OPHTHALMIC | 0 refills | Status: DC | PRN

## 2022-03-04 MED ORDER — POLYETHYLENE GLYCOL 3350 17 G PO PACK: 17.0000 g | PACK | Freq: Every day | ORAL | 0 refills | Status: DC | PRN

## 2022-03-04 NOTE — Progress Notes (Signed)
Patient discharged from 2W34 via PTAR. Patient being taken to [facility]. Patient has belongings, PIV's taken out.

## 2022-03-04 NOTE — TOC Progression Note (Addendum)
Transition of Care [facility]) - Progression Note    Patient Details  Name: Perry Bullock MRN: [id number] Date of Birth: [birth year]  Transition of Care [facility]) CM/SW Contact  Janae Bridgeman, RN Phone Number: 03/04/2022, 2:34 PM  Clinical Narrative:    CM called and spoke with Delorise Shiner, CM at [facility] SN[facility] and the facility received insurance authorization for the patient.  The patient can discharge to the facility today by PTAR.  Attending MD notified and discharge summary and orders will be placed.  The patient was updated and PTAR will be scheduled for transportation.  Patient was given address and contact information for the nursing home so that he can call and update his family. Bedside nursing is aware.  Bedside nursing - Please call report to [facility] ) at [phone].    PTAR was scheduled for 4 pm.  PTAR packet placed at secretary station and includes discharge summary, Facesheet, prescriptions for discharge and Medical necessity form.  CM will continue to follow the patient for SNF placement at [facility] today.  Expected Discharge Plan: Skilled Nursing Facility Barriers to Discharge: Continued Medical Work up  Expected Discharge Plan and Services Expected Discharge Plan: Skilled Nursing Facility   Discharge Planning Services: CM Consult Post Acute Care Choice: Skilled Nursing Facility Living arrangements for the past 2 months: Single Family Home                                       Social Determinants of Health (SDOH) Interventions    Readmission Risk Interventions    02/24/2022    3:26 PM  Readmission Risk Prevention Plan  Transportation Screening Complete  Medication Review (RN Care Manager) Complete  PCP or Specialist appointment within 3-5 days of discharge Complete  HRI or Home Care Consult Complete  SW Recovery Care/Counseling Consult Complete  Palliative Care Screening Complete  Skilled Nursing Facility  Complete
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
## 2022-03-04 NOTE — Discharge Summary (Signed)
Physician Discharge Summary  Perry FlockGary L Bullock ZOX:[id number] DOB: 06/17/1961 DOA: 02/21/2022  PCP: [facility]  Admit date: 02/21/2022 Discharge date: 03/04/2022  Time spent: 55 minutes  Recommendations for Outpatient Follow-up:  Follow-up with MD at SNF.  Patient will need a basic metabolic profile and CBC done in 1 week to follow-up on electrolytes, renal function and H&H. Follow-up with Dr. Jena GaussHaddix, orthopedics in 2 weeks.   Discharge Diagnoses:  Principal Problem:   Closed fracture of left distal femur (HCC) Active Problems:   Alcohol abuse   Thrombocytopenia (HCC)   Bipolar disorder (HCC)   COPD (chronic obstructive pulmonary disease) (HCC)   Macrocytic anemia   Closed fracture of right proximal tibia   Hyponatremia   Prolonged QT interval   Closed fracture of distal end of left femur (HCC)   Vitamin D deficiency   Malnutrition of moderate degree   Dysuria   Discharge Condition: Stable and improved.  Diet recommendation: Heart healthy  Filed Weights   02/21/22 0230 02/21/22 0848  Weight: 71.7 kg 71.2 kg    History of present illness:  HPI per Dr. Wonda CeriseAgbata  Perry Bullock is a 61 y.o. male with medical history significant for nicotine dependence, COPD, degenerative disc disease, bipolar disorder, anxiety, blindness in right eye who presents to the ER for evaluation of pain in both lower extremities for 4 days.  Patient states that he has been unable to ambulate due to the severity of the pain.  Pain is mostly in his knees and he rates it a 7 x 10 in intensity at its worst.  Per patient " I have a bad case of gout" At baseline he ambulates with a rolling walker but he has had several falls over the last couple of days and significant bruising involving the right lower extremity.  He denies having any back pain, no urinary or fecal incontinence.  He has no saddle anesthesia. He denies feeling dizzy or lightheaded.  He denies having any chest pain, no  shortness of breath, no nausea, no vomiting, no fever, no chills, no changes in his bowel habits or urinary symptoms. He received prednisone and colchicine in the ER but was unable to ambulate and so admission has been requested for further evaluation.   Hospital Course:  1 distal left femur fracture/closed fracture right proximal tibia/status post fall -Patient seen in consultation by orthopedics status post ORIF right tibial plateau fracture, left distal femur fracture with removal of hardware from left femur per Dr. Jena GaussHaddix 02/21/2022. -Pain management per orthopedics. -Eliquis 2.5 mg twice daily x 30 days for DVT prophylaxis on discharge per orthopedics. -Patient maintained on vitamin D supplementation x 90 days per orthopedics -Per orthopedics. -Patient will be discharged to SNF with outpatient follow-up with orthopedics.   2.  Vitamin D deficiency -Vitamin D supplementation x 90 days as recommended by orthopedics. -Outpatient follow-up with PCP.   3.  Acute blood loss anemia in the setting of hip fracture/surgery/chronic microcytic anemia -Vitamin B12 level of 354. -Folate level of 14.9. -Status post transfusion 1 unit packed RBC 02/23/2022 -Hemoglobin stabilized at 8.3 by day of discharge. -Patient maintained on vitamin B12 1000 mcg daily during the hospitalization and will be discharged on oral vitamin B12 1000 mcg daily.   -Outpatient follow-up.     4.  Chronic hypoxic respiratory failure on nocturnal oxygen/COPD -Patient with improvement with wheezing.  -Patient maintained on Dulera, Incruse Ellipta, albuterol nebs as needed during the hospitalization.   -Patient noted to  be on chronic home O2.    5.  Hyponatremia -Likely secondary to hypovolemic hyponatremia. -Resolved with hydration.   6.  Prolonged QT interval -Resolved.   7.  Alcohol abuse -Patient with no signs of withdrawal.   -Patient maintained on folic acid, multivitamin, thiamine.     8.  Bipolar  disorder -Patient maintained on home regimen Seroquel, trazodone, Ambien.    9.  Thrombocytopenia -Improved.   10.  Dysuria/ -Urinalysis trace leukocytes, nitrite negative, 0-5 WBCs.   -Preliminary urine cultures with > 100,000 colonies of Pseudomonas aeruginosa.  -Case discussed with ID who had recommended a one-time dose of fosfomycin that was given during the hospitalization prior to discharge.  -No further antibiotics needed at this time.        Procedures: CT right knee/CT left knee 02/20/2022 Plain films of the left femur at 02/21/2022 Plain films of the right knee 02/21/2022, 02/19/2022 Plan films of the left knee 02/19/2022 ORIF of right tibial plateau fracture/ORIF of left distal femur fracture/removal of hardware from left femur per orthopedics: Dr. Jena Gauss 02/21/2022    Consultations: Orthopedics: Dr. Jena Gauss 02/21/2022  Discharge Exam: Vitals:   03/04/22 0744 03/04/22 0750  BP:  103/76  Pulse:  92  Resp:  14  Temp:  98.8 F (37.1 C)  SpO2: 94% 100%    General: NAD Cardiovascular: Regular rate rhythm no murmurs rubs or gallops.  No JVD.  No lower extremity edema. Respiratory: Scattered coarse breath sounds otherwise clear.  No wheezes.  No crackles.  Discharge Instructions   Discharge Instructions     Diet - low sodium heart healthy   Complete by: As directed    Increase activity slowly   Complete by: As directed    No wound care   Complete by: As directed       Allergies as of 03/04/2022       Reactions   Ace Inhibitors Swelling   Angioedema 10/16 requiring intubation @ OSH 2/2 pt reportedly taking someone else's Lisinopril.  Angioedema 10/16 requiring intubation @ OSH 2/2 pt reportedly taking someone else's Lisinopril.    Lisinopril Other (See Comments)   unknown        Medication List     STOP taking these medications    Advair Diskus 500-50 MCG/ACT Aepb Generic drug: fluticasone-salmeterol   ferrous fumarate-b12-vitamic C-folic acid  capsule Commonly known as: TRINSICON / FOLTRIN       TAKE these medications    acetaminophen 325 MG tablet Commonly known as: TYLENOL Take 2 tablets (650 mg total) by mouth every 6 (six) hours as needed for mild pain or moderate pain.   albuterol 108 (90 Base) MCG/ACT inhaler Commonly known as: VENTOLIN HFA Inhale 2 puffs into the lungs every 6 (six) hours as needed for wheezing or shortness of breath.   albuterol (2.5 MG/3ML) 0.083% nebulizer solution Commonly known as: PROVENTIL Take 3 mLs (2.5 mg total) by nebulization every 6 (six) hours as needed for wheezing or shortness of breath.   allopurinol 100 MG tablet Commonly known as: ZYLOPRIM Take 100 mg by mouth daily.   apixaban 2.5 MG Tabs tablet Commonly known as: Eliquis Take 1 tablet (2.5 mg total) by mouth 2 (two) times daily.   bisacodyl 5 MG EC tablet Commonly known as: DULCOLAX Take 1 tablet (5 mg total) by mouth daily as needed for moderate constipation. What changed: when to take this   cetirizine 10 MG tablet Commonly known as: ZYRTEC Take 10 mg by mouth daily as  needed for allergies.   cyanocobalamin 1000 MCG tablet Commonly known as: VITAMIN B12 Take 1 tablet (1,000 mcg total) by mouth daily.   diclofenac Sodium 1 % Gel Commonly known as: VOLTAREN Apply 1 application. topically daily as needed (pain).   docusate sodium 100 MG capsule Commonly known as: COLACE Take 1 capsule (100 mg total) by mouth 2 (two) times daily.   Dulera 100-5 MCG/ACT Aero Generic drug: mometasone-formoterol Inhale 2 puffs by mouth twice daily   feeding supplement Liqd Take 237 mLs by mouth 2 (two) times daily between meals.   ferrous sulfate 325 (65 FE) MG tablet Take 1 tablet (325 mg total) by mouth daily.   fluticasone 50 MCG/ACT nasal spray Commonly known as: FLONASE Place 2 sprays into both nostrils daily as needed for allergies or rhinitis.   folic acid 1 MG tablet Commonly known as: FOLVITE Take 1 tablet (1  mg total) by mouth daily. Start taking on: March 05, 2022   gabapentin 300 MG capsule Commonly known as: NEURONTIN Take 300 mg by mouth 3 (three) times daily.   latanoprost 0.005 % ophthalmic solution Commonly known as: XALATAN Place 1 drop into both eyes at bedtime.   methocarbamol 500 MG tablet Commonly known as: ROBAXIN Take 1 tablet (500 mg total) by mouth every 6 (six) hours as needed for muscle spasms.   multivitamin with minerals Tabs tablet Take 1 tablet by mouth daily.   nicotine 21 mg/24hr patch Commonly known as: NICODERM CQ - dosed in mg/24 hours Place 1 patch (21 mg total) onto the skin daily.   ondansetron 4 MG disintegrating tablet Commonly known as: ZOFRAN-ODT Take 4 mg by mouth every 8 (eight) hours as needed for nausea or vomiting.   Oxycodone HCl 10 MG Tabs Take 1 tablet (10 mg total) by mouth every 4 (four) hours as needed (For SEVERE pain). What changed:  reasons to take this Another medication with the same name was removed. Continue taking this medication, and follow the directions you see here.   pantoprazole 40 MG tablet Commonly known as: PROTONIX Take 1 tablet (40 mg total) by mouth 2 (two) times daily.   polyethylene glycol 17 g packet Commonly known as: MIRALAX / GLYCOLAX Take 17 g by mouth daily as needed for mild constipation.   polyvinyl alcohol 1.4 % ophthalmic solution Commonly known as: LIQUIFILM TEARS Place 2 drops into the right eye as needed for dry eyes.   QUEtiapine 300 MG 24 hr tablet Commonly known as: SEROQUEL XR Take 150 mg by mouth 2 (two) times daily.   Spiriva Respimat 2.5 MCG/ACT Aers Generic drug: Tiotropium Bromide Monohydrate Inhale 2 puffs into the lungs daily.   sucralfate 1 g tablet Commonly known as: CARAFATE Take 1 g by mouth 4 (four) times daily.   SYSTANE OP Place 1 drop into both eyes daily as needed (dry eyes).   thiamine 100 MG tablet Commonly known as: VITAMIN B1 Take 1 tablet (100 mg total)  by mouth daily.   traZODone 150 MG tablet Commonly known as: DESYREL Take 150 mg by mouth at bedtime as needed for sleep.   vitamin D3 25 MCG tablet Commonly known as: CHOLECALCIFEROL Take 1 tablet (1,000 Units total) by mouth daily.   zolpidem 10 MG tablet Commonly known as: AMBIEN Take 10 mg by mouth at bedtime.        Allergies  Allergen Reactions   Ace Inhibitors Swelling    Angioedema 10/16 requiring intubation @ OSH 2/2 pt reportedly taking  someone else's Lisinopril.  Angioedema 10/16 requiring intubation @ OSH 2/2 pt reportedly taking someone else's Lisinopril.     Lisinopril Other (See Comments)    unknown    Contact information for follow-up providers     [facility]. Schedule an appointment as soon as possible for a visit.   Specialty: General Practice Why: Please schedule a primary care follow up once you are [facility]. Contact information: [address] [place] [address] [phone]         Roby Lofts, MD. Schedule an appointment as soon as possible for a visit in 2 week(s).   Specialty: Orthopedic Surgery Why: wound check, suture removal, repeat x-rays Contact information: [address] [place] [address] [phone]         MD AT SNF Follow up.               Contact information for after-discharge care     Destination     [facility] .   Service: Skilled Nursing Contact information: [address] [place] [address] [phone]                      The results of significant diagnostics from this hospitalization (including imaging, microbiology, ancillary and laboratory) are listed below for reference.    Significant Diagnostic Studies: DG Knee Right Port  Result Date: 02/21/2022 CLINICAL DATA:  Fractured tibia EXAM: PORTABLE RIGHT KNEE - 1-2 VIEW COMPARISON:  02/19/2022 FINDINGS: There is interval  internal fixation of comminuted fracture of proximal right tibia with metallic sideplate and multiple screws. There is previous placement of intramedullary rod in right femur. IMPRESSION: Internal fixation of comminuted fracture of proximal shaft of right tibia. Electronically Signed   By: Ernie Avena M.D.   On: 02/21/2022 14:01   DG FEMUR PORT MIN 2 VIEWS LEFT  Result Date: 02/21/2022 CLINICAL DATA:  Fracture left femur EXAM: LEFT FEMUR PORTABLE 2 VIEWS COMPARISON:  02/19/2022 FINDINGS: There is interval internal fixation of fracture of distal femur with metallic plate and multiple screws. There is previous internal fixation of fracture in the neck cough left femur with intramedullary rod with no interval change. IMPRESSION: Interval reduction and internal fixation of fracture of distal metaphyseal region of the left femur. Electronically Signed   By: Ernie Avena M.D.   On: 02/21/2022 13:59   DG Knee Complete 4 Views Right  Result Date: 02/21/2022 CLINICAL DATA:  Open reduction and internal fixation of proximal tibia EXAM: RIGHT KNEE - COMPLETE 4+ VIEW COMPARISON:  Tibia/fibula radiographs 02/19/2022 FINDINGS: Five C-arm fluoroscopic images were obtained intraoperatively and submitted for post operative interpretation. Postsurgical changes reflecting sideplate and screw fixation of the proximal tibia are noted. Fracture alignment is similar to the preoperative study. Hardware alignment is within expected limits, without evidence of immediate complication. Fluoro time 2 minutes 35 seconds, dose 4.25 mGy. Please see the performing provider's procedural report for further detail. IMPRESSION: Intraoperative images during surgical fixation of the proximal tibial fracture as above. Electronically Signed   By: Lesia Hausen M.D.   On: 02/21/2022 13:22   DG FEMUR MIN 2 VIEWS LEFT  Result Date: 02/21/2022 CLINICAL DATA:  Left femoral ORIF EXAM: LEFT FEMUR 2 VIEWS COMPARISON:  02/19/2022 FINDINGS:  Five C-arm fluoroscopic images were obtained intraoperatively and submitted for post operative interpretation. Images obtained during ORIF of distal left femoral fracture with lateral sideplate and screw fixation  construct. 2 minutes 35 seconds fluoroscopy time utilized. Radiation dose: 4.25 mGy. Please see the performing provider's procedural report for further detail. IMPRESSION: ORIF distal left femoral fracture. Electronically Signed   By: Duanne Guess D.O.   On: 02/21/2022 13:19   DG C-Arm 1-60 Min-No Report  Result Date: 02/21/2022 Fluoroscopy was utilized by the requesting physician.  No radiographic interpretation.   DG C-Arm 1-60 Min-No Report  Result Date: 02/21/2022 Fluoroscopy was utilized by the requesting physician.  No radiographic interpretation.   CT KNEE RIGHT WO CONTRAST  Result Date: 02/20/2022 CLINICAL DATA:  Right knee pain.  Multiple recent falls. EXAM: CT OF THE RIGHT KNEE WITHOUT CONTRAST TECHNIQUE: Multidetector CT imaging of the right knee was performed according to the standard protocol. Multiplanar CT image reconstructions were also generated. RADIATION DOSE REDUCTION: This exam was performed according to the departmental dose-optimization program which includes automated exposure control, adjustment of the mA and/or kV according to patient size and/or use of iterative reconstruction technique. COMPARISON:  Right knee x-rays from yesterday. FINDINGS: Bones/Joint/Cartilage Acute minimally impacted fracture of the proximal tibial metadiaphysis with longitudinal intra-articular extension through the medial tibial spine. Acute mildly impacted fracture of the fibular head and neck. Partially visualized distal femoral intramedullary rod with fracture of the interlocking screw. Joint spaces are preserved. Small lipohemarthrosis. Severe osteopenia. Ligaments Ligaments are suboptimally evaluated by CT. Muscles and Tendons Grossly intact. Soft tissue No fluid collection or  hematoma.  No soft tissue mass. IMPRESSION: 1. Acute minimally impacted fracture of the proximal tibial metadiaphysis with longitudinal intra-articular extension through the medial tibial spine. 2. Acute mildly impacted fracture of the fibular head and neck. 3. Small lipohemarthrosis. 4. Partially visualized distal femoral intramedullary rod with fracture of the interlocking screw. Electronically Signed   By: Obie Dredge M.D.   On: 02/20/2022 10:34   CT KNEE LEFT WO CONTRAST  Result Date: 02/20/2022 CLINICAL DATA:  Left knee pain after several falls over the last few days. EXAM: CT OF THE LEFT KNEE WITHOUT CONTRAST TECHNIQUE: Multidetector CT imaging of the left knee was performed according to the standard protocol. Multiplanar CT image reconstructions were also generated. RADIATION DOSE REDUCTION: This exam was performed according to the departmental dose-optimization program which includes automated exposure control, adjustment of the mA and/or kV according to patient size and/or use of iterative reconstruction technique. COMPARISON:  Left knee x-rays from yesterday. FINDINGS: Bones/Joint/Cartilage Acute minimally impacted transverse intra-articular fracture of the distal femoral metaphysis, also involving the medial epicondyle. Partially visualized femoral intramedullary nail and distal interlocking screws, which do not involve the fracture. No dislocation. Joint spaces are preserved. Large lipohemarthrosis. Severe osteopenia. Ligaments Ligaments are suboptimally evaluated by CT. Muscles and Tendons Grossly intact. Soft tissue No fluid collection or hematoma.  No soft tissue mass. IMPRESSION: 1. Acute minimally impacted transverse intra-articular fracture of the distal femoral metaphysis. 2. Large lipohemarthrosis. Electronically Signed   By: Obie Dredge M.D.   On: 02/20/2022 10:28   DG Knee Complete 4 Views Left  Result Date: 02/19/2022 CLINICAL DATA:  Several falls over the last few days. EXAM:  LEFT KNEE - COMPLETE 4+ VIEW COMPARISON:  None Available. FINDINGS: There is a comminuted and minimally displaced distal femur fracture. There is involvement of the medial and lateral metaphysis is well as the medial femoral condyle. No definite fracture line is seen. There is a large joint effusion, however no convincing lipohemarthrosis. No fracture of the proximal tibia or fibula. The bones are diffusely under mineralized. Intramedullary nail  with distal locking screws partially visualized, fracture does not seen to traverse the hardware. IMPRESSION: 1. Comminuted and minimally displaced distal femur fracture primarily involving the metaphysis. 2. Large joint effusion. 3. Intramedullary nail with distal locking screws partially visualized, fracture does not seen to traverse the hardware. Electronically Signed   By: Narda Rutherford M.D.   On: 02/19/2022 18:00   DG Knee Complete 4 Views Right  Result Date: 02/19/2022 CLINICAL DATA:  Several falls over the last few days with bruising. EXAM: RIGHT KNEE - COMPLETE 4+ VIEW COMPARISON:  None Available. FINDINGS: Mildly comminuted but essentially nondisplaced proximal tibial fracture. Fracture involves the metaphysis, however there is a lipohemarthrosis which suggests intra-articular involvement, although no intra-articular fracture line is seen on the current exam. Suspect impacted fracture of the proximal fibula. Femoral intramedullary rod with distal locking screws is partially included. There is no evidence of distal femur fracture. The bones are diffusely under mineralized. Vascular calcifications are seen. IMPRESSION: 1. Mildly comminuted but essentially nondisplaced proximal tibial fracture involving the metaphysis. There is a lipohemarthrosis which is indicative of intra-articular involvement, although intra-articular fracture line is not seen by radiograph. Recommend CT. 2. Suspect impacted fracture of the proximal fibula. Electronically Signed   By:  Narda Rutherford M.D.   On: 02/19/2022 17:58    Microbiology: Recent Results (from the past 240 hour(s))  Urine Culture     Status: Abnormal (Preliminary result)   Collection Time: 03/03/22  9:39 AM   Specimen: Urine, Clean Catch  Result Value Ref Range Status   Specimen Description URINE, CLEAN CATCH  Final   Special Requests NONE  Final   Culture (A)  Final    >=100,000 COLONIES/mL PSEUDOMONAS AERUGINOSA SUSCEPTIBILITIES TO FOLLOW CULTURE REINCUBATED FOR BETTER GROWTH Performed at [facility], [address] [place], [place] [address]    Report Status PENDING  Incomplete     Labs: Basic Metabolic Panel: Recent Labs  Lab 02/27/22 0348 02/28/22 0627 03/03/22 0359  NA 136 136 137  K 4.0 3.6 3.9  CL 101 101 104  CO2 29 27 25   GLUCOSE 104* 103* 96  BUN 14 14 14   CREATININE 0.63 0.69 0.67  CALCIUM 8.8* 8.6* 8.4*   Liver Function Tests: No results for input(s): "AST", "ALT", "ALKPHOS", "BILITOT", "PROT", "ALBUMIN" in the last 168 hours. No results for input(s): "LIPASE", "AMYLASE" in the last 168 hours. No results for input(s): "AMMONIA" in the last 168 hours. CBC: Recent Labs  Lab 02/26/22 1017 02/27/22 0348 02/28/22 0627 03/03/22 0359  WBC 7.0 6.6 6.6 7.6  NEUTROABS  --  3.7  --   --   HGB 8.1* 7.9* 8.1* 8.3*  HCT 24.0* 23.4* 25.1* 25.2*  MCV 103.9* 104.9* 105.9* 104.1*  PLT 289 324 438* 614*   Cardiac Enzymes: No results for input(s): "CKTOTAL", "CKMB", "CKMBINDEX", "TROPONINI" in the last 168 hours. BNP: BNP (last 3 results) Recent Labs    02/19/22 0911  BNP 57.2    ProBNP (last 3 results) No results for input(s): "PROBNP" in the last 8760 hours.  CBG: Recent Labs  Lab 02/26/22 0414  GLUCAP 104*       Signed:  02/21/22 MD.  [facility] 03/04/2022, 3:23 PM

## 2022-03-04 NOTE — Progress Notes (Signed)
1545- Writer attempted to call report to receiving facility [facility]) x2. No answer. Will attempt again in 15 minutes   1625- Writer attempted to call report to receiving facility x2- phone call continues to go to voicemail   1645- Writer called report to [place], LPN at receiving facility. Reviewed AVS and medications. All questions/concerns addressed. Peripheral IV removed.  Updated patient on plan of care. Awaiting PT[facility] to transport to [facility]

## 2022-03-04 NOTE — Progress Notes (Signed)
Physical Therapy Treatment Patient Details Name: Perry Bullock MRN: [id number] DOB: Aug 29, 1960 Today's Date: 03/04/2022   History of Present Illness 61 y.o.m admitted 8/25 for BIL LE fractures. S/p ORIF Lt distal femur, ORIF Rt proximal tibia. PHMx: bipolar disorder, COPD on nocturnal oxygen,alcohol abuse, and gout who has been transferred from [facility].    PT Comments    Pt received in supine, agreeable to limited bed-level session after recently working with OT and returning to bed. Pt noted to have increased BLE/foot edema, legs elevated for edema reduction and pt with ice packs to B knees with frequency reviewed to prevent injury, pt receptive. Pt performed long sitting and supine BLE A/AAROM exercises for strengthening as detailed below and reports compliance with HEP as able during the day, pt with RLE quad lag unable to perform SLR unassisted on R. Pt defers OOB transfers due to impending DC but is eager to progress mobility at SNF. Pt continues to benefit from PT services to progress toward functional mobility goals.    Recommendations for follow up therapy are one component of a multi-disciplinary discharge planning process, led by the attending physician.  Recommendations may be updated based on patient status, additional functional criteria and insurance authorization.  Follow Up Recommendations  Skilled nursing-short term rehab (<3 hours/day) Can patient physically be transported by private vehicle: No   Assistance Recommended at Discharge Frequent or constant Supervision/Assistance  Patient can return home with the following A lot of help with bathing/dressing/bathroom;Two people to help with walking and/or transfers;Assistance with cooking/housework;Help with stairs or ramp for entrance;Assist for transportation   Equipment Recommendations  Other (comment) (TBD post-acute)    Recommendations for Other Services       Precautions / Restrictions Precautions Precautions:  Fall Restrictions Weight Bearing Restrictions: Yes RLE Weight Bearing: Non weight bearing LLE Weight Bearing: Weight bearing as tolerated Other Position/Activity Restrictions: RLE can be WBAT for transfers only; NWB for ambulation     Mobility  Bed Mobility Overal bed mobility: Needs Assistance Bed Mobility: Supine to Sit Rolling: Modified independent (Device/Increase time)   Supine to sit: Modified independent (Device/Increase time), HOB elevated     General bed mobility comments: modI for supine to long sitting in bed and seated posterior scooting toward HOB using BUE, pt defers EOB/OOB due to increased RLE pain and imminent DC, agreeable to bed-level only.    Transfers                   General transfer comment: pt defers EOB/OOB due to upcoming DC, only agreeable to limited bed-level        Balance Overall balance assessment: Needs assistance Sitting-balance support: No upper extremity supported, Feet supported Sitting balance-Leahy Scale: Good Sitting balance - Comments: long sitting in bed unsupported no LOB for weight shifting       Standing balance comment: defer per request, pt pain limited after recent OT session and leaving soon for SNF.                            Cognition Arousal/Alertness: Awake/alert Behavior During Therapy: WFL for tasks assessed/performed Overall Cognitive Status: Within Functional Limits for tasks assessed                                 General Comments: Pleasantly cooperative, receptive to instruction.        Exercises General Exercises -  Lower Extremity Ankle Circles/Pumps: AROM, Both, 10 reps, Supine Quad Sets: AROM, 5 reps, Supine, Both Hip ABduction/ADduction: AROM, Both, 5 reps, Supine Straight Leg Raises: AROM, AAROM, Both, 10 reps, Supine (AA on RLE, AROM on LLE) Other Exercises Other Exercises: supine to long sit x5 reps using bed side rails and x5 reps with arms crossed at chest  emphasis on use of core activation Other Exercises: chair "push up" for BUE tricep strengthening x3 reps in bed (posterior scooting) to simulate chair push up technique.    General Comments General comments (skin integrity, edema, etc.): pt with constriction from BLE edema with tight socks so given larger/looser socks (navy blue color) with pt reporting improved comfort; BLE elevated on x2 pillows and end of bed raised for edema reduction. Ice packs to B knees, reviewed 20 mins on/off schedule and to add layers of toweling/fabric over skin if it feels too cold. Reviewed how to unclip it to add/remove ice to prevent leaking of sleeve.      Pertinent Vitals/Pain Pain Assessment Pain Assessment: Faces Faces Pain Scale: Hurts even more Pain Location: RLE>LLE with SLR and therex in supine Pain Descriptors / Indicators: Constant, Grimacing, Sharp Pain Intervention(s): Limited activity within patient's tolerance, Monitored during session, Repositioned, Ice applied     PT Goals (current goals can now be found in the care plan section) Acute Rehab PT Goals Patient Stated Goal: Less pain and be able to get around on my own before I go home PT Goal Formulation: With patient Time For Goal Achievement: 03/01/22 Progress towards PT goals: Progressing toward goals    Frequency    Min 3X/week      PT Plan Current plan remains appropriate       AM-PAC PT "6 Clicks" Mobility   Outcome Measure  Help needed turning from your back to your side while in a flat bed without using bedrails?: None Help needed moving from lying on your back to sitting on the side of a flat bed without using bedrails?: A Little (fully to edge of bed; none for long sitting) Help needed moving to and from a bed to a chair (including a wheelchair)?: A Lot Help needed standing up from a chair using your arms (e.g., wheelchair or bedside chair)?: Total Help needed to walk in hospital room?: Total Help needed climbing 3-5  steps with a railing? : Total 6 Click Score: 12    End of Session   Activity Tolerance: Patient tolerated treatment well;Patient limited by pain (recently worked with OT and got back to bed so defers OOB again) Patient left: in bed;with call bell/phone within reach;Other (comment) (BLE elevated, heels floated, ice on B knees) Nurse Communication: Mobility status;Other (comment) (pt asking what his new room phone # will be so he can tell his significant other) PT Visit Diagnosis: History of falling (Z91.81);Difficulty in walking, not elsewhere classified (R26.2);Pain Pain - Right/Left: Right Pain - part of body: Leg     Time: 8099-8338 PT Time Calculation (min) (ACUTE ONLY): 16 min  Charges:  $Therapeutic Exercise: 8-22 mins                     Jaceyon Strole P., PTA Acute Rehabilitation Services Secure Chat Preferred 9a-5:30pm Office: [phone]    [place] 03/04/2022, 5:13 PM

## 2022-03-06 LAB — URINE CULTURE: Culture: >=100000 — AB

## 2022-04-23 ENCOUNTER — Ambulatory Visit: Payer: Medicaid Other | Admitting: Nurse Practitioner

## 2022-06-03 ENCOUNTER — Ambulatory Visit: Payer: Medicaid Other | Admitting: Nurse Practitioner

## 2022-06-03 ENCOUNTER — Encounter: Payer: Self-pay | Admitting: Nurse Practitioner

## 2022-06-03 VITALS — BP 118/76 | HR 101 | Temp 98.5°F | Resp 16 | Ht 71.0 in | Wt 137.8 lb

## 2022-06-03 DIAGNOSIS — J441 Chronic obstructive pulmonary disease with (acute) exacerbation: Secondary | ICD-10-CM

## 2022-06-03 DIAGNOSIS — R0602 Shortness of breath: Secondary | ICD-10-CM | POA: Diagnosis not present

## 2022-06-03 DIAGNOSIS — F17219 Nicotine dependence, cigarettes, with unspecified nicotine-induced disorders: Secondary | ICD-10-CM | POA: Diagnosis not present

## 2022-06-03 MED ORDER — SPIRIVA RESPIMAT 2.5 MCG/ACT IN AERS: 2.0000 | INHALATION_SPRAY | Freq: Every day | RESPIRATORY_TRACT | 11 refills | Status: DC

## 2022-06-03 MED ORDER — NICOTINE 21 MG/24HR TD PT24: 21.0000 mg | MEDICATED_PATCH | Freq: Every day | TRANSDERMAL | 5 refills | Status: DC

## 2022-06-03 MED ORDER — DULERA 100-5 MCG/ACT IN AERO: 2.0000 | INHALATION_SPRAY | Freq: Two times a day (BID) | RESPIRATORY_TRACT | 3 refills | Status: DC

## 2022-06-03 MED ORDER — ALBUTEROL SULFATE (2.5 MG/3ML) 0.083% IN NEBU: 2.5000 mg | INHALATION_SOLUTION | Freq: Four times a day (QID) | RESPIRATORY_TRACT | 5 refills | Status: DC | PRN

## 2022-06-03 MED ORDER — ALBUTEROL SULFATE HFA 108 (90 BASE) MCG/ACT IN AERS: 2.0000 | INHALATION_SPRAY | Freq: Four times a day (QID) | RESPIRATORY_TRACT | 5 refills | Status: DC | PRN

## 2022-06-03 NOTE — Progress Notes (Signed)
[facility] [address] [place], [place] [address]  Internal MEDICINE  Office Visit Note  Patient Name: Perry Bullock  [id number]  [id number]  Date of Service: 06/03/2022  Chief Complaint  Patient presents with   Follow-up    PULMONARY ONLY    HPI Perry Bullock presents for a follow up visit for COPD, asthma, refills.  --spiro done in office -- slightly improved FVC but decreased FEV1. Ratio decreased, showing worsening of respiratory function with severe obstruction and low vital capacity --taking spiriva and dulera daily. Uses albuterol inhaler and neb treatment prn  --needs refills     Current Medication: Outpatient Encounter Medications as of 06/03/2022  Medication Sig Note   acetaminophen (TYLENOL) 325 MG tablet Take 2 tablets (650 mg total) by mouth every 6 (six) hours as needed for mild pain or moderate pain.    allopurinol (ZYLOPRIM) 100 MG tablet Take 100 mg by mouth daily.    bisacodyl (DULCOLAX) 5 MG EC tablet Take 1 tablet (5 mg total) by mouth daily as needed for moderate constipation. (Patient taking differently: Take 5 mg by mouth daily.)    cetirizine (ZYRTEC) 10 MG tablet Take 10 mg by mouth daily as needed for allergies.    diclofenac Sodium (VOLTAREN) 1 % GEL Apply 1 application. topically daily as needed (pain).    docusate sodium (COLACE) 100 MG capsule Take 1 capsule (100 mg total) by mouth 2 (two) times daily.    feeding supplement (ENSURE ENLIVE / ENSURE PLUS) LIQD Take 237 mLs by mouth 2 (two) times daily between meals.    ferrous sulfate 325 (65 FE) MG tablet Take 1 tablet (325 mg total) by mouth daily.    fluticasone (FLONASE) 50 MCG/ACT nasal spray Place 2 sprays into both nostrils daily as needed for allergies or rhinitis.    folic acid (FOLVITE) 1 MG tablet Take 1 tablet (1 mg total) by mouth daily.    gabapentin (NEURONTIN) 300 MG capsule Take 300 mg by mouth 3 (three) times daily. 02/21/2022: Patient states he is still taking this medication     latanoprost (XALATAN) 0.005 % ophthalmic solution Place 1 drop into both eyes at bedtime.    methocarbamol (ROBAXIN) 500 MG tablet Take 1 tablet (500 mg total) by mouth every 6 (six) hours as needed for muscle spasms.    Multiple Vitamin (MULTIVITAMIN WITH MINERALS) TABS tablet Take 1 tablet by mouth daily.    ondansetron (ZOFRAN-ODT) 4 MG disintegrating tablet Take 4 mg by mouth every 8 (eight) hours as needed for nausea or vomiting.    Oxycodone HCl 10 MG TABS Take 1 tablet (10 mg total) by mouth every 4 (four) hours as needed (For SEVERE pain).    Polyethyl Glycol-Propyl Glycol (SYSTANE OP) Place 1 drop into both eyes daily as needed (dry eyes).    polyethylene glycol (MIRALAX / GLYCOLAX) 17 g packet Take 17 g by mouth daily as needed for mild constipation.    polyvinyl alcohol (LIQUIFILM TEARS) 1.4 % ophthalmic solution Place 2 drops into the right eye as needed for dry eyes.    QUEtiapine (SEROQUEL XR) 300 MG 24 hr tablet Take 150 mg by mouth 2 (two) times daily. 02/21/2022: Patient states he still taking this medication and has on hand    sucralfate (CARAFATE) 1 g tablet Take 1 g by mouth 4 (four) times daily.    thiamine 100 MG tablet Take 1 tablet (100 mg total) by mouth daily.    traZODone (DESYREL) 150 MG tablet  Take 150 mg by mouth at bedtime as needed for sleep.    zolpidem (AMBIEN) 10 MG tablet Take 10 mg by mouth at bedtime.    [DISCONTINUED] albuterol (PROVENTIL) (2.5 MG/3ML) 0.083% nebulizer solution Take 3 mLs (2.5 mg total) by nebulization every 6 (six) hours as needed for wheezing or shortness of breath.    [DISCONTINUED] albuterol (VENTOLIN HFA) 108 (90 Base) MCG/ACT inhaler Inhale 2 puffs into the lungs every 6 (six) hours as needed for wheezing or shortness of breath.    [DISCONTINUED] mometasone-formoterol (DULERA) 100-5 MCG/ACT AERO Inhale 2 puffs by mouth twice daily    [DISCONTINUED] nicotine (NICODERM CQ - DOSED IN MG/24 HOURS) 21 mg/24hr patch Place 1 patch (21 mg total)  onto the skin daily. 02/21/2022: Taken patch off today however has not applied a new one at this time    [DISCONTINUED] Tiotropium Bromide Monohydrate (SPIRIVA RESPIMAT) 2.5 MCG/ACT AERS Inhale 2 puffs into the lungs daily.    albuterol (PROVENTIL) (2.5 MG/3ML) 0.083% nebulizer solution Take 3 mLs (2.5 mg total) by nebulization every 6 (six) hours as needed for wheezing or shortness of breath.    albuterol (VENTOLIN HFA) 108 (90 Base) MCG/ACT inhaler Inhale 2 puffs into the lungs every 6 (six) hours as needed for wheezing or shortness of breath.    apixaban (ELIQUIS) 2.5 MG TABS tablet Take 1 tablet (2.5 mg total) by mouth 2 (two) times daily.    mometasone-formoterol (DULERA) 100-5 MCG/ACT AERO Inhale 2 puffs into the lungs 2 (two) times daily.    nicotine (NICODERM CQ - DOSED IN MG/24 HOURS) 21 mg/24hr patch Place 1 patch (21 mg total) onto the skin daily.    pantoprazole (PROTONIX) 40 MG tablet Take 1 tablet (40 mg total) by mouth 2 (two) times daily.    Tiotropium Bromide Monohydrate (SPIRIVA RESPIMAT) 2.5 MCG/ACT AERS Inhale 2 puffs into the lungs daily.    No facility-administered encounter medications on file as of 06/03/2022.    Surgical History: Past Surgical History:  Procedure Laterality Date   BIOPSY  10/09/2021   Procedure: BIOPSY;  Surgeon: Daryel November, MD;  Location: [facility];  Service: Gastroenterology;;   ESOPHAGOGASTRODUODENOSCOPY (EGD) WITH PROPOFOL N/A 10/09/2021   Procedure: ESOPHAGOGASTRODUODENOSCOPY (EGD) WITH PROPOFOL;  Surgeon: Daryel November, MD;  Location: [facility];  Service: Gastroenterology;  Laterality: N/A;   EYE SURGERY Right    HIP SURGERY Left    INTRAMEDULLARY (IM) NAIL INTERTROCHANTERIC Right 01/16/2017   Procedure: INTRAMEDULLARY (IM) NAIL INTERTROCHANTRIC;  Surgeon: Corky Mull, MD;  Location: [facility];  Service: Orthopedics;  Laterality: Right;   INTRAMEDULLARY (IM) NAIL INTERTROCHANTERIC Left 03/09/2020   Procedure: INTRAMEDULLARY (IM)  NAIL INTERTROCHANTRIC;  Surgeon: Thornton Park, MD;  Location: [facility];  Service: Orthopedics;  Laterality: Left;   INTUBATION-ENDOTRACHEAL WITH TRACHEOSTOMY STANDBY N/A 04/22/2015   Procedure: INTUBATION-ENDOTRACHEAL WITH TRACHEOSTOMY STANDBY;  Surgeon: Beverly Gust, MD;  Location: [facility];  Service: ENT;  Laterality: N/A;   JOINT REPLACEMENT Left    Lt shoulder   MANDIBLE SURGERY     ORIF FEMUR FRACTURE Left 02/21/2022   Procedure: OPEN REDUCTION INTERNAL FIXATION  OF LEFT DISTAL FEMUR;  Surgeon: Shona Needles, MD;  Location: [facility];  Service: Orthopedics;  Laterality: Left;   ORIF TIBIA PLATEAU Right 02/21/2022   Procedure: OPEN REDUCTION INTERNAL FIXATION OF RIGHT PROXIMAL TIBIAL;  Surgeon: Shona Needles, MD;  Location: [facility];  Service: Orthopedics;  Laterality: Right;    Medical History: Past Medical History:  Diagnosis Date  Anxiety    Arthritis    Asthma    Bipolar 1 disorder ([place])    Blind right eye    Broken hip (HCC)    Chronic back pain    Diverticulosis   Chronic headaches    Convulsion ([place])    once a month when stands up too quickly   COPD (chronic obstructive pulmonary disease) (HCC)    DDD (degenerative disc disease), lumbar    Diverticulitis    GERD (gastroesophageal reflux disease)    Glaucoma    Hepatitis    hepatitis C   Pneumonia    PVD (peripheral vascular disease) (HCC)    lower extremities reddened and feet swollen   Seasonal allergies    Shortness of breath dyspnea     Family History: Family History  Problem Relation Age of Onset   Breast cancer Mother    Hypertension Father    Diabetes Father     Social History   Socioeconomic History   Marital status: Single    Spouse name: Not on file   Number of children: Not on file   Years of education: Not on file   Highest education level: Not on file  Occupational History   Not on file  Tobacco Use   Smoking status: Every Day    Packs/day: 0.00    Years: 35.00    Total pack  years: 0.00    Types: Cigarettes   Smokeless tobacco: Never   Tobacco comments:    4 cigs daily--02/27/16, pt has patch  Substance and Sexual Activity   Alcohol use: Yes    Alcohol/week: 3.0 standard drinks of alcohol    Types: 3 Cans of beer per week    Comment: pt is cutting back to 3-4 daily   Drug use: No   Sexual activity: Yes    Birth control/protection: None  Other Topics Concern   Not on file  Social History Narrative   Not on file   Social Determinants of Health   Financial Resource Strain: Not on file  Food Insecurity: Not on file  Transportation Needs: Not on file  Physical Activity: Not on file  Stress: Not on file  Social Connections: Not on file  Intimate Partner Violence: Not on file      Review of Systems  Constitutional:  Positive for fatigue. Negative for chills and unexpected weight change.  HENT:  Negative for congestion, rhinorrhea, sneezing and sore throat.   Respiratory:  Negative for cough, chest tightness and shortness of breath.   Cardiovascular: Negative.  Negative for chest pain and palpitations.  Musculoskeletal:  Positive for arthralgias. Negative for back pain, joint swelling and neck pain.  Neurological: Negative.   Psychiatric/Behavioral:  Behavioral problem: Depression.     Vital Signs: BP 118/76   Pulse (!) 101   Temp 98.5 F (36.9 C)   Resp 16   Ht 5\' 11"  (1.803 m)   Wt 137 lb 12.8 oz (62.5 kg)   SpO2 95%   BMI 19.22 kg/m    Physical Exam Cardiovascular:     Rate and Rhythm: Normal rate and regular rhythm.     Heart sounds: Normal heart sounds. No murmur heard. Pulmonary:     Effort: Pulmonary effort is normal. No accessory muscle usage or respiratory distress.     Breath sounds: Normal air entry. No decreased air movement. Examination of the right-upper field reveals wheezing and rales. Examination of the left-upper field reveals wheezing and rales. Wheezing and rales present.  Assessment/Plan: 1. Chronic  obstructive pulmonary disease with acute exacerbation (HCC) Severe COPD, per spiro results, function is slightly worse. Continue current medications as prescribed.  - albuterol (PROVENTIL) (2.5 MG/3ML) 0.083% nebulizer solution; Take 3 mLs (2.5 mg total) by nebulization every 6 (six) hours as needed for wheezing or shortness of breath.  Dispense: 150 mL; Refill: 5 - albuterol (VENTOLIN HFA) 108 (90 Base) MCG/ACT inhaler; Inhale 2 puffs into the lungs every 6 (six) hours as needed for wheezing or shortness of breath.  Dispense: 8 g; Refill: 5 - mometasone-formoterol (DULERA) 100-5 MCG/ACT AERO; Inhale 2 puffs into the lungs 2 (two) times daily.  Dispense: 13 g; Refill: 3 - Tiotropium Bromide Monohydrate (SPIRIVA RESPIMAT) 2.5 MCG/ACT AERS; Inhale 2 puffs into the lungs daily.  Dispense: 4 g; Refill: 11  2. Shortness of breath Perry Bullock done, refills ordered, continue albuterol inhaler as needed as prescribed.  - Spirometry with Graph - albuterol (PROVENTIL) (2.5 MG/3ML) 0.083% nebulizer solution; Take 3 mLs (2.5 mg total) by nebulization every 6 (six) hours as needed for wheezing or shortness of breath.  Dispense: 150 mL; Refill: 5  3. Cigarette nicotine dependence with nicotine-induced disorder Working on quitting, patches refilled.  - nicotine (NICODERM CQ - DOSED IN MG/24 HOURS) 21 mg/24hr patch; Place 1 patch (21 mg total) onto the skin daily.  Dispense: 28 patch; Refill: 5   General Counseling: Perry Bullock understanding of the findings of todays visit and agrees with plan of treatment. I have discussed any further diagnostic evaluation that may be needed or ordered today. We also reviewed his medications today. he has been encouraged to call the office with any questions or concerns that should arise related to todays visit.    Orders Placed This Encounter  Procedures   Spirometry with Graph    Meds ordered this encounter  Medications   albuterol (PROVENTIL) (2.5 MG/3ML) 0.083%  nebulizer solution    Sig: Take 3 mLs (2.5 mg total) by nebulization every 6 (six) hours as needed for wheezing or shortness of breath.    Dispense:  150 mL    Refill:  5   albuterol (VENTOLIN HFA) 108 (90 Base) MCG/ACT inhaler    Sig: Inhale 2 puffs into the lungs every 6 (six) hours as needed for wheezing or shortness of breath.    Dispense:  8 g    Refill:  5   mometasone-formoterol (DULERA) 100-5 MCG/ACT AERO    Sig: Inhale 2 puffs into the lungs 2 (two) times daily.    Dispense:  13 g    Refill:  3   Tiotropium Bromide Monohydrate (SPIRIVA RESPIMAT) 2.5 MCG/ACT AERS    Sig: Inhale 2 puffs into the lungs daily.    Dispense:  4 g    Refill:  11   nicotine (NICODERM CQ - DOSED IN MG/24 HOURS) 21 mg/24hr patch    Sig: Place 1 patch (21 mg total) onto the skin daily.    Dispense:  28 patch    Refill:  5    Return in about 6 months (around 12/03/2022) for F/U, pulmonary only, Perry Bullock.   Total time spent:30 Minutes Time spent includes review of chart, medications, test results, and follow up plan with the patient.   Perry Bullock Controlled Substance Database was reviewed by me.  This patient was seen by Jonetta Osgood, FNP-C in collaboration with Dr. Clayborn Bigness as a part of collaborative care agreement.   Blayze Haen R. Valetta Fuller, MSN, FNP-C Internal medicine

## 2022-06-06 ENCOUNTER — Encounter: Payer: Self-pay | Admitting: Nurse Practitioner

## 2022-10-27 ENCOUNTER — Ambulatory Visit: Payer: Medicaid Other

## 2022-11-21 ENCOUNTER — Other Ambulatory Visit: Payer: Self-pay | Admitting: Emergency Medicine

## 2022-11-21 DIAGNOSIS — Z87891 Personal history of nicotine dependence: Secondary | ICD-10-CM

## 2022-11-21 DIAGNOSIS — F17210 Nicotine dependence, cigarettes, uncomplicated: Secondary | ICD-10-CM

## 2022-11-21 DIAGNOSIS — Z122 Encounter for screening for malignant neoplasm of respiratory organs: Secondary | ICD-10-CM

## 2022-12-02 ENCOUNTER — Encounter: Payer: Self-pay | Admitting: Nurse Practitioner

## 2022-12-02 ENCOUNTER — Ambulatory Visit
Admission: RE | Admit: 2022-12-02 | Discharge: 2022-12-02 | Disposition: A | Discharge status: Home / Self Care | Payer: Medicaid Other | Source: Ambulatory Visit | Attending: Acute Care | Admitting: Acute Care

## 2022-12-02 ENCOUNTER — Ambulatory Visit: Payer: Medicaid Other | Admitting: Nurse Practitioner

## 2022-12-02 VITALS — BP 120/76 | HR 97 | Temp 98.5°F | Resp 16 | Ht 71.0 in | Wt 141.0 lb

## 2022-12-02 DIAGNOSIS — Z87891 Personal history of nicotine dependence: Secondary | ICD-10-CM

## 2022-12-02 DIAGNOSIS — J441 Chronic obstructive pulmonary disease with (acute) exacerbation: Secondary | ICD-10-CM

## 2022-12-02 DIAGNOSIS — R0602 Shortness of breath: Secondary | ICD-10-CM | POA: Diagnosis not present

## 2022-12-02 DIAGNOSIS — Z122 Encounter for screening for malignant neoplasm of respiratory organs: Secondary | ICD-10-CM

## 2022-12-02 DIAGNOSIS — F17210 Nicotine dependence, cigarettes, uncomplicated: Secondary | ICD-10-CM | POA: Diagnosis present

## 2022-12-02 MED ORDER — ALBUTEROL SULFATE (2.5 MG/3ML) 0.083% IN NEBU: 2.5000 mg | INHALATION_SOLUTION | Freq: Four times a day (QID) | RESPIRATORY_TRACT | 5 refills | Status: DC | PRN

## 2022-12-02 MED ORDER — ALBUTEROL SULFATE HFA 108 (90 BASE) MCG/ACT IN AERS: 2.0000 | INHALATION_SPRAY | Freq: Four times a day (QID) | RESPIRATORY_TRACT | 5 refills | Status: DC | PRN

## 2022-12-02 MED ORDER — TIOTROPIUM BROMIDE MONOHYDRATE 18 MCG IN CAPS: 18.0000 ug | ORAL_CAPSULE | Freq: Every day | RESPIRATORY_TRACT | 12 refills | Status: DC

## 2022-12-02 MED ORDER — DULERA 100-5 MCG/ACT IN AERO: 2.0000 | INHALATION_SPRAY | Freq: Two times a day (BID) | RESPIRATORY_TRACT | 3 refills | Status: DC

## 2022-12-02 NOTE — Progress Notes (Signed)
[facility] [address] [place], [place] [address]  Internal MEDICINE  Office Visit Note  Patient Name: Perry Bullock  [id number]  [id number]  Date of Service: 12/02/2022  Chief Complaint  Patient presents with   Follow-up    HPI Perry Bullock presents for a follow-up visit for COPD Perry Bullock presents for a follow up visit for COPD, refills.  --COPD -- taking spiriva and dulera daily. Uses albuterol inhaler and neb treatment prn  --needs refills, wants to switch spiriva respimat to spiriva handihaler. Overdue for PFT, spirometry done at last visit. Feels like his breathing is worse, has been having a flare up recently.  Still wears oxygen some times .  Had his CT chest lung cancer screening done today.  Still smoking  Current Medication: Outpatient Encounter Medications as of 12/02/2022  Medication Sig Note   acetaminophen (TYLENOL) 325 MG tablet Take 2 tablets (650 mg total) by mouth every 6 (six) hours as needed for mild pain or moderate pain.    allopurinol (ZYLOPRIM) 100 MG tablet Take 100 mg by mouth daily.    bisacodyl (DULCOLAX) 5 MG EC tablet Take 1 tablet (5 mg total) by mouth daily as needed for moderate constipation. (Patient taking differently: Take 5 mg by mouth daily.)    cetirizine (ZYRTEC) 10 MG tablet Take 10 mg by mouth daily as needed for allergies.    diclofenac Sodium (VOLTAREN) 1 % GEL Apply 1 application. topically daily as needed (pain).    docusate sodium (COLACE) 100 MG capsule Take 1 capsule (100 mg total) by mouth 2 (two) times daily.    feeding supplement (ENSURE ENLIVE / ENSURE PLUS) LIQD Take 237 mLs by mouth 2 (two) times daily between meals.    fluticasone (FLONASE) 50 MCG/ACT nasal spray Place 2 sprays into both nostrils daily as needed for allergies or rhinitis.    folic acid (FOLVITE) 1 MG tablet Take 1 tablet (1 mg total) by mouth daily.    gabapentin (NEURONTIN) 300 MG capsule Take 300 mg by mouth 3 (three) times daily. 02/21/2022: Patient  states Perry Bullock is still taking this medication    latanoprost (XALATAN) 0.005 % ophthalmic solution Place 1 drop into both eyes at bedtime.    methocarbamol (ROBAXIN) 500 MG tablet Take 1 tablet (500 mg total) by mouth every 6 (six) hours as needed for muscle spasms.    Multiple Vitamin (MULTIVITAMIN WITH MINERALS) TABS tablet Take 1 tablet by mouth daily.    nicotine (NICODERM CQ - DOSED IN MG/24 HOURS) 21 mg/24hr patch Place 1 patch (21 mg total) onto the skin daily.    Oxycodone HCl 10 MG TABS Take 1 tablet (10 mg total) by mouth every 4 (four) hours as needed (For SEVERE pain).    Polyethyl Glycol-Propyl Glycol (SYSTANE OP) Place 1 drop into both eyes daily as needed (dry eyes).    polyethylene glycol (MIRALAX / GLYCOLAX) 17 g packet Take 17 g by mouth daily as needed for mild constipation.    polyvinyl alcohol (LIQUIFILM TEARS) 1.4 % ophthalmic solution Place 2 drops into the right eye as needed for dry eyes.    QUEtiapine (SEROQUEL XR) 300 MG 24 hr tablet Take 150 mg by mouth 2 (two) times daily. 02/21/2022: Patient states Perry Bullock still taking this medication and has on hand    sucralfate (CARAFATE) 1 g tablet Take 1 g by mouth 4 (four) times daily.    thiamine 100 MG tablet Take 1 tablet (100 mg total) by mouth daily.  tiotropium (SPIRIVA HANDIHALER) 18 MCG inhalation capsule Place 1 capsule (18 mcg total) into inhaler and inhale daily.    traZODone (DESYREL) 150 MG tablet Take 150 mg by mouth at bedtime as needed for sleep.    zolpidem (AMBIEN) 10 MG tablet Take 10 mg by mouth at bedtime.    [DISCONTINUED] albuterol (PROVENTIL) (2.5 MG/3ML) 0.083% nebulizer solution Take 3 mLs (2.5 mg total) by nebulization every 6 (six) hours as needed for wheezing or shortness of breath.    [DISCONTINUED] albuterol (VENTOLIN HFA) 108 (90 Base) MCG/ACT inhaler Inhale 2 puffs into the lungs every 6 (six) hours as needed for wheezing or shortness of breath.    [DISCONTINUED] mometasone-formoterol (DULERA) 100-5  MCG/ACT AERO Inhale 2 puffs into the lungs 2 (two) times daily.    [DISCONTINUED] ondansetron (ZOFRAN-ODT) 4 MG disintegrating tablet Take 4 mg by mouth every 8 (eight) hours as needed for nausea or vomiting.    [DISCONTINUED] Tiotropium Bromide Monohydrate (SPIRIVA RESPIMAT) 2.5 MCG/ACT AERS Inhale 2 puffs into the lungs daily.    albuterol (PROVENTIL) (2.5 MG/3ML) 0.083% nebulizer solution Take 3 mLs (2.5 mg total) by nebulization every 6 (six) hours as needed for wheezing or shortness of breath.    albuterol (VENTOLIN HFA) 108 (90 Base) MCG/ACT inhaler Inhale 2 puffs into the lungs every 6 (six) hours as needed for wheezing or shortness of breath.    apixaban (ELIQUIS) 2.5 MG TABS tablet Take 1 tablet (2.5 mg total) by mouth 2 (two) times daily.    ferrous sulfate 325 (65 FE) MG tablet Take 1 tablet (325 mg total) by mouth daily.    mometasone-formoterol (DULERA) 100-5 MCG/ACT AERO Inhale 2 puffs into the lungs 2 (two) times daily.    pantoprazole (PROTONIX) 40 MG tablet Take 1 tablet (40 mg total) by mouth 2 (two) times daily.    No facility-administered encounter medications on file as of 12/02/2022.    Surgical History: Past Surgical History:  Procedure Laterality Date   BIOPSY  10/09/2021   Procedure: BIOPSY;  Surgeon: Jenel Lucks, MD;  Location: [facility] ENDOSCOPY;  Service: Gastroenterology;;   ESOPHAGOGASTRODUODENOSCOPY (EGD) WITH PROPOFOL N/A 10/09/2021   Procedure: ESOPHAGOGASTRODUODENOSCOPY (EGD) WITH PROPOFOL;  Surgeon: Jenel Lucks, MD;  Location: [facility] ENDOSCOPY;  Service: Gastroenterology;  Laterality: N/A;   EYE SURGERY Right    HIP SURGERY Left    INTRAMEDULLARY (IM) NAIL INTERTROCHANTERIC Right 01/16/2017   Procedure: INTRAMEDULLARY (IM) NAIL INTERTROCHANTRIC;  Surgeon: Christena Flake, MD;  Location: [facility] ORS;  Service: Orthopedics;  Laterality: Right;   INTRAMEDULLARY (IM) NAIL INTERTROCHANTERIC Left 03/09/2020   Procedure: INTRAMEDULLARY (IM) NAIL INTERTROCHANTRIC;   Surgeon: Juanell Fairly, MD;  Location: [facility] ORS;  Service: Orthopedics;  Laterality: Left;   INTUBATION-ENDOTRACHEAL WITH TRACHEOSTOMY STANDBY N/A 04/22/2015   Procedure: INTUBATION-ENDOTRACHEAL WITH TRACHEOSTOMY STANDBY;  Surgeon: Linus Salmons, MD;  Location: [facility] ORS;  Service: ENT;  Laterality: N/A;   JOINT REPLACEMENT Left    Lt shoulder   MANDIBLE SURGERY     ORIF FEMUR FRACTURE Left 02/21/2022   Procedure: OPEN REDUCTION INTERNAL FIXATION  OF LEFT DISTAL FEMUR;  Surgeon: Roby Lofts, MD;  Location: [facility] OR;  Service: Orthopedics;  Laterality: Left;   ORIF TIBIA PLATEAU Right 02/21/2022   Procedure: OPEN REDUCTION INTERNAL FIXATION OF RIGHT PROXIMAL TIBIAL;  Surgeon: Roby Lofts, MD;  Location: [facility] OR;  Service: Orthopedics;  Laterality: Right;    Medical History: Past Medical History:  Diagnosis Date   Anxiety    Arthritis  Asthma    Bipolar 1 disorder (HCC)    Blind right eye    Broken hip (HCC)    Chronic back pain    Diverticulosis   Chronic headaches    Convulsion (HCC)    once a month when stands up too quickly   COPD (chronic obstructive pulmonary disease) (HCC)    DDD (degenerative disc disease), lumbar    Diverticulitis    GERD (gastroesophageal reflux disease)    Glaucoma    Hepatitis    hepatitis C   Pneumonia    PVD (peripheral vascular disease) (HCC)    lower extremities reddened and feet swollen   Seasonal allergies    Shortness of breath dyspnea     Family History: Family History  Problem Relation Age of Onset   Breast cancer Mother    Hypertension Father    Diabetes Father     Social History   Socioeconomic History   Marital status: Single    Spouse name: Not on file   Number of children: Not on file   Years of education: Not on file   Highest education level: Not on file  Occupational History   Not on file  Tobacco Use   Smoking status: Every Day    Packs/day: 0.00    Years: 35.00    Additional pack years: 0.00    Total  pack years: 0.00    Types: Cigarettes   Smokeless tobacco: Never   Tobacco comments:    4 cigs daily--02/27/16, pt has patch  Substance and Sexual Activity   Alcohol use: Yes    Alcohol/week: 3.0 standard drinks of alcohol    Types: 3 Cans of beer per week    Comment: pt is cutting back to 3-4 daily   Drug use: No   Sexual activity: Yes    Birth control/protection: None  Other Topics Concern   Not on file  Social History Narrative   Not on file   Social Determinants of Health   Financial Resource Strain: Not on file  Food Insecurity: Not on file  Transportation Needs: Not on file  Physical Activity: Not on file  Stress: Not on file  Social Connections: Not on file  Intimate Partner Violence: Not on file      Review of Systems  Constitutional:  Positive for fatigue. Negative for chills and unexpected weight change.  HENT:  Negative for congestion, rhinorrhea, sneezing and sore throat.   Respiratory:  Positive for cough, chest tightness, shortness of breath and wheezing.   Cardiovascular: Negative.  Negative for chest pain and palpitations.  Musculoskeletal:  Positive for arthralgias. Negative for back pain, joint swelling and neck pain.  Neurological: Negative.   Psychiatric/Behavioral:  Behavioral problem: Depression.     Vital Signs: BP 120/76   Pulse 97   Temp 98.5 F (36.9 C)   Resp 16   Ht 5\' 11"  (1.803 m)   Wt 141 lb (64 kg)   SpO2 93%   BMI 19.67 kg/m    Physical Exam Vitals reviewed.  Constitutional:      General: Perry Bullock is not in acute distress.    Appearance: Normal appearance. Perry Bullock is normal weight. Perry Bullock is ill-appearing.  HENT:     Head: Normocephalic and atraumatic.  Eyes:     Pupils: Pupils are equal, round, and reactive to light.  Cardiovascular:     Rate and Rhythm: Normal rate and regular rhythm.     Heart sounds: Normal heart sounds. No murmur heard. Pulmonary:  Effort: Accessory muscle usage present. No respiratory distress.     Breath  sounds: Decreased air movement present. Examination of the right-upper field reveals wheezing. Examination of the left-upper field reveals wheezing. Examination of the right-middle field reveals wheezing. Examination of the left-middle field reveals wheezing. Examination of the right-lower field reveals wheezing. Examination of the left-lower field reveals wheezing. Wheezing present.  Neurological:     Mental Status: Perry Bullock is alert and oriented to person, place, and time.  Psychiatric:        Mood and Affect: Mood normal.        Behavior: Behavior normal.        Assessment/Plan: 1. Chronic obstructive pulmonary disease with acute exacerbation (HCC) Switch to spiriva handihaler PFT ordered, follow up to review PFT results. Dulera refills ordered  - Pulmonary function test; Future - tiotropium (SPIRIVA HANDIHALER) 18 MCG inhalation capsule; Place 1 capsule (18 mcg total) into inhaler and inhale daily.  Dispense: 30 capsule; Refill: 12 - mometasone-formoterol (DULERA) 100-5 MCG/ACT AERO; Inhale 2 puffs into the lungs 2 (two) times daily.  Dispense: 13 g; Refill: 3 - albuterol (VENTOLIN HFA) 108 (90 Base) MCG/ACT inhaler; Inhale 2 puffs into the lungs every 6 (six) hours as needed for wheezing or shortness of breath.  Dispense: 8 g; Refill: 5 - albuterol (PROVENTIL) (2.5 MG/3ML) 0.083% nebulizer solution; Take 3 mLs (2.5 mg total) by nebulization every 6 (six) hours as needed for wheezing or shortness of breath.  Dispense: 150 mL; Refill: 5  2. Shortness of breath PFT ordered  Albuterol nebulizer and inhaler ordered  - Pulmonary function test; Future - albuterol (VENTOLIN HFA) 108 (90 Base) MCG/ACT inhaler; Inhale 2 puffs into the lungs every 6 (six) hours as needed for wheezing or shortness of breath.  Dispense: 8 g; Refill: 5 - albuterol (PROVENTIL) (2.5 MG/3ML) 0.083% nebulizer solution; Take 3 mLs (2.5 mg total) by nebulization every 6 (six) hours as needed for wheezing or shortness of  breath.  Dispense: 150 mL; Refill: 5  3. Cigarette smoker Continues to smoke, not ready to quit   General Counseling: Perry Bullock understanding of the findings of todays visit and agrees with plan of treatment. I have discussed any further diagnostic evaluation that may be needed or ordered today. We also reviewed his medications today. Perry Bullock has been encouraged to call the office with any questions or concerns that should arise related to todays visit.    Orders Placed This Encounter  Procedures   Pulmonary function test    Meds ordered this encounter  Medications   tiotropium (SPIRIVA HANDIHALER) 18 MCG inhalation capsule    Sig: Place 1 capsule (18 mcg total) into inhaler and inhale daily.    Dispense:  30 capsule    Refill:  12    Patient wants to switch back to handihaler instead of using respimat   mometasone-formoterol (DULERA) 100-5 MCG/ACT AERO    Sig: Inhale 2 puffs into the lungs 2 (two) times daily.    Dispense:  13 g    Refill:  3   albuterol (VENTOLIN HFA) 108 (90 Base) MCG/ACT inhaler    Sig: Inhale 2 puffs into the lungs every 6 (six) hours as needed for wheezing or shortness of breath.    Dispense:  8 g    Refill:  5   albuterol (PROVENTIL) (2.5 MG/3ML) 0.083% nebulizer solution    Sig: Take 3 mLs (2.5 mg total) by nebulization every 6 (six) hours as needed for wheezing or shortness of breath.  Dispense:  150 mL    Refill:  5    For future refills    Return for F/U, PFT @ [place], [place] or [place].   Total time spent:30 Minutes Time spent includes review of chart, medications, test results, and follow up plan with the patient.   [place] Controlled Substance Database was reviewed by me.  This patient was seen by Sallyanne Kuster, FNP-C in collaboration with Dr. Beverely Risen as a part of collaborative care agreement.   Kendria Halberg R. Tedd Sias, MSN, FNP-C Internal medicine

## 2022-12-10 ENCOUNTER — Encounter: Payer: Medicaid Other | Admitting: Internal Medicine

## 2022-12-12 ENCOUNTER — Telehealth: Payer: Self-pay | Admitting: Acute Care

## 2022-12-12 DIAGNOSIS — Z122 Encounter for screening for malignant neoplasm of respiratory organs: Secondary | ICD-10-CM

## 2022-12-12 DIAGNOSIS — Z87891 Personal history of nicotine dependence: Secondary | ICD-10-CM

## 2022-12-12 NOTE — Telephone Encounter (Signed)
This guys scan was read as a LR 4 A. He has a slowly growing very ugly looking nodule in the RLL. Let me know what you think about it. I was wondering how you felt about PET vs Biodesix. Thanks

## 2022-12-12 NOTE — Telephone Encounter (Signed)
I have attempted to call the patient with the results of his low-dose screening CT.  His scan was read as a lung RADS 4A.  He has multiple nodules some of which are scarring and others that look like they could potentially be a result of an atypical chronic infection like MAC. Plan will be for 45-month follow-up low-dose screening CT, however we also need to get the patient into see either Dr. Jayme Cloud or Dr.Dgayli  or K[place] to be evaluated for possible MAC. D, D, and E. Please send results to PCP and order 3 month follow up scan.  Thanks so much,

## 2022-12-15 NOTE — Telephone Encounter (Signed)
See other phone note

## 2022-12-15 NOTE — Telephone Encounter (Signed)
Called and left VM for pt

## 2022-12-16 NOTE — Telephone Encounter (Signed)
Called and left VM for pt to schedule

## 2022-12-16 NOTE — Telephone Encounter (Signed)
Yes that would be okay.  This does not need to be urgent.

## 2022-12-16 NOTE — Addendum Note (Signed)
Addended by: Sheran Luz on: 12/16/2022 10:53 AM   Modules accepted: Orders

## 2022-12-16 NOTE — Telephone Encounter (Signed)
Called and left VM for pt

## 2022-12-16 NOTE — Telephone Encounter (Signed)
Patient returned call. Informed him and his significant other of the results. Pt verbalized understanding and is agreeable to 3 month repeat scan and OV with [place] provider. Order placed for 3 month CT chest. Soonest appt with [facility] provider is with Dr. Jayme Cloud on 7/9 for nodule clinic.   Dr. Jayme Cloud, is 7/9 soon enough for appt for pt?

## 2022-12-17 NOTE — Telephone Encounter (Signed)
Called and left VM for pt to schedule appt with Dr. Jayme Cloud on 7/9.

## 2022-12-18 ENCOUNTER — Telehealth: Payer: Self-pay | Admitting: Internal Medicine

## 2022-12-18 NOTE — Telephone Encounter (Signed)
Left vm to confirm 12/24/2022 appointment & to schedule follow up for results-Toni

## 2022-12-18 NOTE — Telephone Encounter (Signed)
Patient has scheduled visit with Dr. Jayme Cloud for 01/13/2023. Will close encounter.

## 2022-12-24 ENCOUNTER — Ambulatory Visit: Payer: Medicaid Other | Admitting: Internal Medicine

## 2022-12-24 DIAGNOSIS — J441 Chronic obstructive pulmonary disease with (acute) exacerbation: Secondary | ICD-10-CM | POA: Diagnosis not present

## 2022-12-24 DIAGNOSIS — R0602 Shortness of breath: Secondary | ICD-10-CM

## 2022-12-25 NOTE — Procedures (Signed)
[facility] [address] [place], [address]    Complete Pulmonary Function Testing Interpretation:  FINDINGS:  Forced vital capacity is moderately decreased.  The FEV1 is 1.25 L which is 34% of predicted and is severely decreased.  FEV1 FVC ratio is severely reduced.  Postbronchodilator there is no significant improvement in the FEV1 however clinical improvement may occur in the absence of spirometric improvement.  Total capacity is normal.  Residual increased.  Residual on total capacity ratio is increased.  FRC is increased.  DLCO was not measurable  IMPRESSION:  's pulmonary function study is consistent with severe obstructive lung disease  Yevonne Pax, MD [facility] Pulmonary Critical Care Medicine Sleep Medicine

## 2022-12-27 ENCOUNTER — Other Ambulatory Visit: Payer: Self-pay | Admitting: Nurse Practitioner

## 2022-12-27 DIAGNOSIS — J441 Chronic obstructive pulmonary disease with (acute) exacerbation: Secondary | ICD-10-CM

## 2022-12-29 ENCOUNTER — Other Ambulatory Visit: Payer: Self-pay

## 2022-12-29 ENCOUNTER — Emergency Department
Admission: EM | Admit: 2022-12-29 | Discharge: 2022-12-29 | Disposition: A | Discharge status: Home / Self Care | Payer: Medicaid Other | Attending: Emergency Medicine | Admitting: Emergency Medicine

## 2022-12-29 ENCOUNTER — Emergency Department: Payer: Medicaid Other

## 2022-12-29 DIAGNOSIS — J449 Chronic obstructive pulmonary disease, unspecified: Secondary | ICD-10-CM | POA: Diagnosis not present

## 2022-12-29 DIAGNOSIS — E860 Dehydration: Secondary | ICD-10-CM

## 2022-12-29 DIAGNOSIS — K1379 Other lesions of oral mucosa: Secondary | ICD-10-CM

## 2022-12-29 DIAGNOSIS — K1370 Unspecified lesions of oral mucosa: Secondary | ICD-10-CM

## 2022-12-29 LAB — BASIC METABOLIC PANEL
Anion gap: 13 (ref 5–15)
BUN: 5 mg/dL — ABNORMAL LOW (ref 8–23)
CO2: 23 mmol/L (ref 22–32)
Calcium: 8.7 mg/dL — ABNORMAL LOW (ref 8.9–10.3)
Chloride: 100 mmol/L (ref 98–111)
Creatinine, Ser: 0.54 mg/dL — ABNORMAL LOW (ref 0.61–1.24)
GFR, Estimated: >60 mL/min (ref >60)
Glucose, Bld: 120 mg/dL — ABNORMAL HIGH (ref 70–99)
Potassium: 3.9 mmol/L (ref 3.5–5.1)
Sodium: 136 mmol/L (ref 135–145)

## 2022-12-29 LAB — PULMONARY FUNCTION TEST

## 2022-12-29 LAB — CBC
HCT: 38.7 % — ABNORMAL LOW (ref 39.0–52.0)
Hemoglobin: 13.1 g/dL (ref 13.0–17.0)
MCH: 34.7 pg — ABNORMAL HIGH (ref 26.0–34.0)
MCHC: 33.9 g/dL (ref 30.0–36.0)
MCV: 102.4 fL — ABNORMAL HIGH (ref 80.0–100.0)
Platelets: 208 10*3/uL (ref 150–400)
RBC: 3.78 MIL/uL — ABNORMAL LOW (ref 4.22–5.81)
RDW: 13.6 % (ref 11.5–15.5)
WBC: 7.7 10*3/uL (ref 4.0–10.5)
nRBC: 0 % (ref 0.0–0.2)

## 2022-12-29 MED ORDER — SODIUM CHLORIDE 0.9 % IV BOLUS
1000.0000 mL | Freq: Once | INTRAVENOUS | Status: AC
  Administered 2022-12-29: 1000 mL via INTRAVENOUS

## 2022-12-29 MED ORDER — IOHEXOL 300 MG/ML  SOLN
75.0000 mL | Freq: Once | INTRAMUSCULAR | Status: AC | PRN
  Administered 2022-12-29: 75 mL via INTRAVENOUS

## 2022-12-29 MED ORDER — LIDOCAINE VISCOUS HCL 2 % MT SOLN: 5.0000 mL | Freq: Four times a day (QID) | OROMUCOSAL | 0 refills | Status: AC | PRN

## 2022-12-29 NOTE — ED Notes (Signed)
Attempted iv acces x2, ED RN brandon to make attempt to gain access.

## 2022-12-29 NOTE — ED Provider Notes (Signed)
[facility] Provider Note    Event Date/Time   First MD Initiated Contact with Patient 12/29/22 1300     (approximate)   History   Mouth Lesions   HPI  Perry Bullock is a 62 y.o. male   Past medical history of COPD, smoker, bipolar, chronic back pain, hepatitis C who presents to the emergency department with pain and discomfort underneath the left side of his tongue and into his left jaw for the last 2 to 3 weeks, progressively worsening.  No difficulty with breathing, maintaining secretions, though feels the pain is worse when trying to chew and swallow.  He denies any fever.  Independent Historian contributed to assessment above: Significant other is at bedside to corroborate information given above.      Physical Exam   Triage Vital Signs: ED Triage Vitals  Enc Vitals Group     BP 12/29/22 1245 124/88     Pulse Rate 12/29/22 1245 (!) 121     Resp 12/29/22 1244 18     Temp 12/29/22 1244 98.6 F (37 C)     Temp src --      SpO2 12/29/22 1245 97 %     Weight 12/29/22 1246 145 lb (65.8 kg)     Height 12/29/22 1246 5\' 10"  (1.778 m)     Head Circumference --      Peak Flow --      Pain Score 12/29/22 1245 8     Pain Loc --      Pain Edu? --      Excl. in GC? --     Most recent vital signs: Vitals:   12/29/22 1317 12/29/22 1318  BP:    Pulse: (!) 123 (!) 120  Resp:  (!) 24  Temp:    SpO2: 98% 99%    General: Awake, no distress.  CV:  Good peripheral perfusion.  Resp:  Normal effort.  Abd:  No distention.  Other:  He has some white lesions across the bottom of his tongue midline and left side, some nodularity to the top of the tongue, no significant lymphadenopathy swelling or masses to the submandibular area, his neck is supple with full range of motion, he has some tachycardia, afebrile.   ED Results / Procedures / Treatments   Labs (all labs ordered are listed, but only abnormal results are displayed) Labs Reviewed  CBC -  Abnormal; Notable for the following components:      Result Value   RBC 3.78 (*)    HCT 38.7 (*)    MCV 102.4 (*)    MCH 34.7 (*)    All other components within normal limits  BASIC METABOLIC PANEL - Abnormal; Notable for the following components:   Glucose, Bld 120 (*)    BUN 5 (*)    Creatinine, Ser 0.54 (*)    Calcium 8.7 (*)    All other components within normal limits     I ordered and reviewed the above labs they are notable for white blood cell count is normal.  EKG  ED ECG REPORT I, Pilar Jarvis, the attending physician, personally viewed and interpreted this ECG.   Date: 12/29/2022  EKG Time: 1319  Rate: 114  Rhythm: Sinus tachycardia  Axis: nl  Intervals:rbbb  ST&T Change: No STEMI   PROCEDURES:  Critical Care performed: No  Procedures   MEDICATIONS ORDERED IN ED: Medications  sodium chloride 0.9 % bolus 1,000 mL (1,000 mLs Intravenous New Bag/Given 12/29/22  1502)  iohexol (OMNIPAQUE) 300 MG/ML solution 75 mL (75 mLs Intravenous Contrast Given 12/29/22 1541)    External physician / consultants:  I spoke with Dr Elenore Rota of ENT regarding care plan for this patient.   IMPRESSION / MDM / ASSESSMENT AND PLAN / ED COURSE  I reviewed the triage vital signs and the nursing notes.                                Patient's presentation is most consistent with acute presentation with potential threat to life or bodily function.  Differential diagnosis includes, but is not limited to, oral malignancy, oral abscess, dehydration   The patient is on the cardiac monitor to evaluate for evidence of arrhythmia and/or significant heart rate changes.  MDM:    Patient with oral lesions painful with some white plaques underneath the tongue most concerning for oral malignancy given the subacute nature worsening and exposures including cigarette smoking, alcohol.  No obvious signs of abscess on my clinical exam, not consistent with Ludwig angina, and has been maintaining  secretions and airway.  Will obtain basic labs, as well as a CT scan of the soft tissue neck to assess for abscess or any sort of drainable collection today, but if negative will follow-up with ENT for further assessment of suspected oral cancer.  He is tachycardic and has not eaten or drank very much today, suspect in the setting of dehydration, will give IV crystalloid bolus 1 L.   At time of signout, imaging is still pending.  If negative, plan will be for discharge with anesthetic mouthwash and ENT follow-up as above.        FINAL CLINICAL IMPRESSION(S) / ED DIAGNOSES   Final diagnoses:  Unspecified lesions of oral mucosa  Mouth pain  Dehydration     Rx / DC Orders   ED Discharge Orders          Ordered    magic mouthwash (lidocaine, diphenhydrAMINE, alum & mag hydroxide) suspension  4 times daily PRN        12/29/22 1543             Note:  This document was prepared using Dragon voice recognition software and may include unintentional dictation errors.    Pilar Jarvis, MD 12/29/22 [phone]

## 2022-12-29 NOTE — Discharge Instructions (Addendum)
Use Tylenol 650 mg every 6 hours and mouthwash as prescribed for your oral pain.  Call ENT for follow-up for further evaluation of your tongue pain to check for cancer and treatment options.  Try your best to drink plenty of fluids to stay well-hydrated.

## 2022-12-29 NOTE — ED Notes (Signed)
Patient transported to CT 

## 2022-12-29 NOTE — ED Triage Notes (Signed)
Pt to ED for sore to left side of mouth for a couple weeks. Reports pulmonologist discovered some "nodules" and was trying to wait for further appts. Also reports swelling to left jaw. Swelling noted to tongue and jaw. Wife reports speech is affected.

## 2022-12-29 NOTE — ED Provider Notes (Signed)
-----------------------------------------   3:25 PM on 12/29/2022 -----------------------------------------  Blood pressure 124/88, pulse (!) 120, temperature 98.6 F (37 C), resp. rate (!) 24, height 5\' 10"  (1.778 m), weight 65.8 kg, SpO2 99 %.  Assuming care from Dr. Modesto Charon.  In short, Perry Bullock is a 62 y.o. male with a chief complaint of Mouth Lesions .  Refer to the original H&P for additional details.  The current plan of care is to follow-up CT imaging of neck for mouth nodules and swelling.  ----------------------------------------- 5:28 PM on 12/29/2022 ----------------------------------------- CT imaging is concerning for contrast-enhancing mass at his left anterior tongue.  Findings reviewed with Dr. Elenore Rota of ENT, who shares concern that this represents malignancy.  Patient informed of this finding and need to follow-up with ENT in a timely manner.  Patient appropriate for outpatient management, may follow-up with ENT for biopsy.  He was counseled to return to the ED for new or worsening symptoms, patient agrees with plan.    Chesley Noon, MD 12/29/22 [phone]

## 2022-12-30 ENCOUNTER — Other Ambulatory Visit: Payer: Self-pay | Admitting: Nurse Practitioner

## 2023-01-05 ENCOUNTER — Other Ambulatory Visit: Payer: Self-pay | Admitting: Surgery

## 2023-01-05 DIAGNOSIS — S72142D Displaced intertrochanteric fracture of left femur, subsequent encounter for closed fracture with routine healing: Secondary | ICD-10-CM

## 2023-01-05 DIAGNOSIS — M25552 Pain in left hip: Secondary | ICD-10-CM

## 2023-01-06 ENCOUNTER — Institutional Professional Consult (permissible substitution): Payer: Medicaid Other | Admitting: Pulmonary Disease

## 2023-01-08 ENCOUNTER — Other Ambulatory Visit: Payer: Medicaid Other

## 2023-01-13 ENCOUNTER — Encounter: Payer: Self-pay | Admitting: Pulmonary Disease

## 2023-01-13 ENCOUNTER — Ambulatory Visit (INDEPENDENT_AMBULATORY_CARE_PROVIDER_SITE_OTHER): Payer: Medicaid Other | Admitting: Pulmonary Disease

## 2023-01-13 ENCOUNTER — Institutional Professional Consult (permissible substitution): Payer: Medicaid Other | Admitting: Student in an Organized Health Care Education/Training Program

## 2023-01-13 VITALS — BP 120/70 | HR 105 | Temp 98.8°F | Ht 71.0 in | Wt 139.0 lb

## 2023-01-13 DIAGNOSIS — J439 Emphysema, unspecified: Secondary | ICD-10-CM

## 2023-01-13 DIAGNOSIS — J449 Chronic obstructive pulmonary disease, unspecified: Secondary | ICD-10-CM

## 2023-01-13 DIAGNOSIS — R911 Solitary pulmonary nodule: Secondary | ICD-10-CM

## 2023-01-13 DIAGNOSIS — C01 Malignant neoplasm of base of tongue: Secondary | ICD-10-CM | POA: Diagnosis not present

## 2023-01-13 DIAGNOSIS — G4736 Sleep related hypoventilation in conditions classified elsewhere: Secondary | ICD-10-CM

## 2023-01-13 DIAGNOSIS — Z720 Tobacco use: Secondary | ICD-10-CM

## 2023-01-13 NOTE — Patient Instructions (Signed)
Continue using your nebulizer twice a day or as needed.  Use Dulera twice a day, Spiriva once a day.  You may still use your as needed albuterol.  IF YOU ARE USING THE DULERA YOU DO NOT NEED TO USE THE ADVAIR, THESE ARE BOTH THE SAME TYPE OF INHALER.  Continue using your oxygen at nighttime.  You have a follow-up with Dr. Welton Flakes on 6 August, keep that appointment.  Keep your appointment at [facility] tomorrow.  We have provided you the report of the scan of your chest.  Currently procedures cannot be done in your lung due to the severity of your lung disease and the issues you are having with your upper airway.  They may need to treat the spot in your lung with preventive radiation.  But I am sure they will do more tests at [facility] to include a PET/CT.  We will see you here on an as-needed basis.  Dr. 

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
## 2023-01-17 ENCOUNTER — Encounter: Payer: Self-pay | Admitting: Pulmonary Disease

## 2023-01-19 ENCOUNTER — Other Ambulatory Visit: Payer: Self-pay | Admitting: Nurse Practitioner

## 2023-01-23 ENCOUNTER — Other Ambulatory Visit: Payer: Self-pay | Admitting: Otolaryngology

## 2023-01-23 DIAGNOSIS — K148 Other diseases of tongue: Secondary | ICD-10-CM

## 2023-01-30 ENCOUNTER — Encounter: Admission: RE | Admit: 2023-01-30 | Payer: Medicaid Other | Source: Ambulatory Visit

## 2023-02-02 ENCOUNTER — Ambulatory Visit: Payer: Medicaid Other | Admitting: Internal Medicine

## 2023-02-03 ENCOUNTER — Ambulatory Visit: Payer: Medicaid Other | Admitting: Internal Medicine

## 2023-02-04 ENCOUNTER — Ambulatory Visit: Payer: Medicaid Other

## 2023-02-06 ENCOUNTER — Encounter: Admission: RE | Admit: 2023-02-06 | Payer: Medicaid Other | Source: Ambulatory Visit

## 2023-02-06 DIAGNOSIS — K148 Other diseases of tongue: Secondary | ICD-10-CM | POA: Diagnosis present

## 2023-02-06 LAB — GLUCOSE, CAPILLARY: Glucose-Capillary: 63 mg/dL — ABNORMAL LOW (ref 70–99)

## 2023-02-06 MED ORDER — FLUDEOXYGLUCOSE F - 18 (FDG) INJECTION
7.6800 | Freq: Once | INTRAVENOUS | Status: AC | PRN
  Administered 2023-02-06: 7.68 via INTRAVENOUS

## 2023-03-03 ENCOUNTER — Ambulatory Visit: Payer: Medicaid Other | Admitting: Internal Medicine

## 2023-03-04 ENCOUNTER — Ambulatory Visit: Payer: Medicaid Other

## 2023-05-04 ENCOUNTER — Ambulatory Visit
Admission: RE | Admit: 2023-05-04 | Discharge: 2023-05-04 | Disposition: A | Discharge status: Home / Self Care | Payer: Medicaid Other | Source: Ambulatory Visit | Attending: Radiation Oncology | Admitting: Radiation Oncology

## 2023-05-04 ENCOUNTER — Encounter: Payer: Self-pay | Admitting: Radiation Oncology

## 2023-05-04 ENCOUNTER — Other Ambulatory Visit: Payer: Self-pay | Admitting: *Deleted

## 2023-05-04 VITALS — BP 129/71 | HR 123 | Temp 96.4°F | Resp 18 | Ht 71.0 in | Wt 144.5 lb

## 2023-05-04 DIAGNOSIS — C040 Malignant neoplasm of anterior floor of mouth: Secondary | ICD-10-CM | POA: Insufficient documentation

## 2023-05-04 DIAGNOSIS — Z510 Encounter for antineoplastic radiation therapy: Secondary | ICD-10-CM | POA: Insufficient documentation

## 2023-05-04 NOTE — Consult Note (Signed)
NEW PATIENT EVALUATION  Name: Perry Bullock  MRN: [id number]  Date:   05/04/2023     DOB: 08/16/1960   This 62 y.o. 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  REFERRING PHYSICIAN: [facility]  CHIEF COMPLAINT: No chief complaint on file.   DIAGNOSIS: The encounter diagnosis was Malignant neoplasm anterior floor of mouth (HCC).   PREVIOUS INVESTIGATIONS:  PET CT scan reviewed postoperative PET CT scan ordered Pathology reports reviewed Clinical notes reviewed  HPI: Patient is 62 year old male with past medical history of EtOH abuse bipolar disorder COPD GERD hep C.  He had presented with a progressing ulcerative lesion of the anterior floor of mouth and anterior tongue.  He had undergone at [facility] a composite resection floor of mouth tongue and send a segmental mandibulectomy and bilateral neck dissection.  He had a left scapular free flap with microvascular reconstruction.  A at the time of surgery no lymph nodes bilaterally were noted to contain metastatic disease with extensive lymph node sampling of bilateral neck.  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  Surgical margins were negative for tumor although close.  Tumor invaded the cortex and marrow space of the mandibular bone and into the submandibular gland.  His postoperative course was complicated by an extensive hospitalization and ICU admission he had ventilatory dependence as well as permanent feeding tube placement.  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  Patient is now doing fairly well he is continues to have floor of mouth and anterior submandibular pain.  He is also having some trismus and some difficulty with  dysphagia.  He is seen today for consideration of postoperative radiation therapy based on the bone involvement and close margins.  PLANNED TREATMENT REGIMEN: Adjuvant radiation therapy  PAST MEDICAL HISTORY:  has a past medical history of Anxiety, Arthritis, Asthma, Bipolar 1 disorder (HCC), Blind right eye, Broken hip (HCC), Chronic back pain, Chronic headaches, Convulsion (HCC), COPD (chronic obstructive pulmonary disease) (HCC), DDD (degenerative disc disease), lumbar, Diverticulitis, GERD (gastroesophageal reflux disease), Glaucoma, Hepatitis, Pneumonia, PVD (peripheral vascular disease) (HCC), Seasonal allergies, and Shortness of breath dyspnea.    PAST SURGICAL HISTORY:  Past Surgical History:  Procedure Laterality Date   BIOPSY  10/09/2021   Procedure: BIOPSY;  Surgeon: Jenel Lucks, MD;  Location: [facility] ENDOSCOPY;  Service: Gastroenterology;;   ESOPHAGOGASTRODUODENOSCOPY (EGD) WITH PROPOFOL N/A 10/09/2021   Procedure: ESOPHAGOGASTRODUODENOSCOPY (EGD) WITH PROPOFOL;  Surgeon: Jenel Lucks, MD;  Location: [facility] ENDOSCOPY;  Service: Gastroenterology;  Laterality: N/A;   EYE SURGERY Right    HIP SURGERY Left    INTRAMEDULLARY (IM) NAIL INTERTROCHANTERIC Right 01/16/2017   Procedure: INTRAMEDULLARY (IM) NAIL INTERTROCHANTRIC;  Surgeon: Christena Flake, MD;  Location: [facility] ORS;  Service: Orthopedics;  Laterality: Right;   INTRAMEDULLARY (IM) NAIL INTERTROCHANTERIC Left 03/09/2020   Procedure: INTRAMEDULLARY (IM) NAIL INTERTROCHANTRIC;  Surgeon: Juanell Fairly, MD;  Location: [facility] ORS;  Service: Orthopedics;  Laterality: Left;   INTUBATION-ENDOTRACHEAL WITH TRACHEOSTOMY STANDBY N/A 04/22/2015   Procedure: INTUBATION-ENDOTRACHEAL WITH TRACHEOSTOMY STANDBY;  Surgeon: Linus Salmons, MD;  Location: [facility] ORS;  Service: ENT;  Laterality: N/A;   JOINT REPLACEMENT Left    Lt shoulder   MANDIBLE SURGERY     ORIF FEMUR FRACTURE Left 02/21/2022  Procedure: OPEN REDUCTION INTERNAL FIXATION  OF  LEFT DISTAL FEMUR;  Surgeon: Roby Lofts, MD;  Location: [facility] OR;  Service: Orthopedics;  Laterality: Left;   ORIF TIBIA PLATEAU Right 02/21/2022   Procedure: OPEN REDUCTION INTERNAL FIXATION OF RIGHT PROXIMAL TIBIAL;  Surgeon: Roby Lofts, MD;  Location: [facility] OR;  Service: Orthopedics;  Laterality: Right;    FAMILY HISTORY: family history includes Breast cancer in his mother; Diabetes in his father; Hypertension in his father.  SOCIAL HISTORY:  reports that he has been smoking cigarettes. He has never used smokeless tobacco. He reports current alcohol use of about 3.0 standard drinks of alcohol per week. He reports that he does not use drugs.  ALLERGIES: Ace inhibitors and Lisinopril  MEDICATIONS:  Current Outpatient Medications  Medication Sig Dispense Refill   ADVAIR DISKUS 500-50 MCG/ACT AEPB Inhale 1 puff into the lungs in the morning and at bedtime.     albuterol (PROVENTIL) (2.5 MG/3ML) 0.083% nebulizer solution Take 3 mLs (2.5 mg total) by nebulization every 6 (six) hours as needed for wheezing or shortness of breath. 150 mL 5   albuterol (VENTOLIN HFA) 108 (90 Base) MCG/ACT inhaler Inhale 2 puffs into the lungs every 6 (six) hours as needed for wheezing or shortness of breath. 8 g 5   allopurinol (ZYLOPRIM) 100 MG tablet Take 100 mg by mouth daily.     bisacodyl (DULCOLAX) 5 MG EC tablet Take 1 tablet (5 mg total) by mouth daily as needed for moderate constipation. 30 tablet 0   cetirizine (ZYRTEC) 10 MG tablet Take 10 mg by mouth daily as needed for allergies.     diclofenac Sodium (VOLTAREN) 1 % GEL Apply 1 application. topically daily as needed (pain).     docusate sodium (COLACE) 100 MG capsule Take 1 capsule (100 mg total) by mouth 2 (two) times daily. 10 capsule 0   DULERA 200-5 MCG/ACT AERO INHALE 2 PUFFS TWICE DAILY 39 g 0   feeding supplement (ENSURE ENLIVE / ENSURE PLUS) LIQD Take 237 mLs by mouth 2 (two) times daily between meals. 237 mL 12   fluticasone (FLONASE) 50  MCG/ACT nasal spray Place 2 sprays into both nostrils daily as needed for allergies or rhinitis.     gabapentin (NEURONTIN) 300 MG capsule Take 300 mg by mouth 3 (three) times daily.     latanoprost (XALATAN) 0.005 % ophthalmic solution Place 1 drop into both eyes at bedtime.     lidocaine (LIDODERM) 5 % Place 1 patch onto the skin daily.     methocarbamol (ROBAXIN) 500 MG tablet Take 1 tablet (500 mg total) by mouth every 6 (six) hours as needed for muscle spasms. 28 tablet 0   Multiple Vitamin (MULTIVITAMIN WITH MINERALS) TABS tablet Take 1 tablet by mouth daily. 30 tablet 0   Multiple Vitamins-Minerals (PRESERVISION AREDS 2 PO) Take 1 Thousand Units by mouth daily.     nicotine (NICODERM CQ - DOSED IN MG/24 HOURS) 21 mg/24hr patch Place 1 patch (21 mg total) onto the skin daily. 28 patch 5   ondansetron (ZOFRAN) 4 MG tablet Take 4 mg by mouth every 8 (eight) hours as needed.     Oxycodone HCl 10 MG TABS Take 1 tablet (10 mg total) by mouth every 4 (four) hours as needed (For SEVERE pain). 42 tablet 0   pantoprazole (PROTONIX) 40 MG tablet Take 1 tablet (40 mg total) by mouth 2 (two) times daily. 112 tablet 0   pantoprazole (PROTONIX) 40 MG tablet Take  40 mg by mouth 2 (two) times daily.     Polyethyl Glycol-Propyl Glycol (SYSTANE OP) Place 1 drop into both eyes daily as needed (dry eyes).     polyvinyl alcohol (LIQUIFILM TEARS) 1.4 % ophthalmic solution Place 2 drops into the right eye as needed for dry eyes. 15 mL 0   QUEtiapine (SEROQUEL XR) 300 MG 24 hr tablet Take 150 mg by mouth 2 (two) times daily.     sucralfate (CARAFATE) 1 g tablet Take 1 g by mouth 4 (four) times daily.     thiamine 100 MG tablet Take 1 tablet (100 mg total) by mouth daily. 30 tablet 0   tiotropium (SPIRIVA HANDIHALER) 18 MCG inhalation capsule Place 1 capsule (18 mcg total) into inhaler and inhale daily. 30 capsule 12   traZODone (DESYREL) 150 MG tablet Take 150 mg by mouth at bedtime as needed for sleep.      zolpidem (AMBIEN) 10 MG tablet Take 10 mg by mouth at bedtime.     No current facility-administered medications for this encounter.    ECOG PERFORMANCE STATUS:  1 - Symptomatic but completely ambulatory  REVIEW OF SYSTEMS: Patient is status post corneal lens transplant EtOH abuse history arthritis bipolar affective disorder COPD GERD glaucoma hepatitis C gout pneumonia Patient denies any weight loss, fatigue, weakness, fever, chills or night sweats. Patient denies any loss of vision, blurred vision. Patient denies any ringing  of the ears or hearing loss. No irregular heartbeat. Patient denies heart murmur or history of fainting. Patient denies any chest pain or pain radiating to her upper extremities. Patient denies any shortness of breath, difficulty breathing at night, cough or hemoptysis. Patient denies any swelling in the lower legs. Patient denies any nausea vomiting, vomiting of blood, or coffee ground material in the vomitus. Patient denies any stomach pain. Patient states has had normal bowel movements no significant constipation or diarrhea. Patient denies any dysuria, hematuria or significant nocturia. Patient denies any problems walking, swelling in the joints or loss of balance. Patient denies any skin changes, loss of hair or loss of weight. Patient denies any excessive worrying or anxiety or significant depression. Patient denies any problems with insomnia. Patient denies excessive thirst, polyuria, polydipsia. Patient denies any swollen glands, patient denies easy bruising or easy bleeding. Patient denies any recent infections, allergies or URI. Patient "s visual fields have not changed significantly in recent time.   PHYSICAL EXAM: BP 129/71   Pulse (!) 123   Temp (!) 96.4 F (35.8 C)   Resp 18   Ht 5\' 11"  (1.803 m)   Wt 144 lb 8 oz (65.5 kg)   BMI 20.15 kg/m  Patient is status post partial glossectomy.  No evidence of mass or nodularity is noted in his remaining tongue.  He is  also status post partial mandibulectomy again no evidence of mass or nodularity in the head and neck region is noted.  Oral cavity is clear.  Well-developed well-nourished patient in NAD. HEENT reveals PERLA, EOMI, discs not visualized.  Oral cavity is clear. No oral mucosal lesions are identified. Neck is clear without evidence of cervical or supraclavicular adenopathy. Lungs are clear to A&P. Cardiac examination is essentially unremarkable with regular rate and rhythm without murmur rub or thrill. Abdomen is benign with no organomegaly or masses noted. Motor sensory and DTR levels are equal and symmetric in the upper and lower extremities. Cranial nerves II through XII are grossly intact. Proprioception is intact. No peripheral adenopathy or edema is  identified. No motor or sensory levels are noted. Crude visual fields are within normal range.  LABORATORY DATA: Pathology reports reviewed    RADIOLOGY RESULTS: PET CT scan reviewed PET postoperative PET CT scan ordered   IMPRESSION: Stage IVa squamous cell carcinoma of the anterior tongue floor mouth status post resection in 62 year old male  PLAN: At this time I like to go ahead with adjuvant radiation therapy.  I am running a postoperative PET scan to make sure there is no gross residual disease at this time.  It is been prolonged time.  Since his surgery and I just want to make sure there has been no progression since completion of surgery.  I would plan on delivering 60 [address] to the areas of previous tumor involvement by his initial PET CT scan adjusting my plans based on new PET/CT findings.  Risks and benefits of treatment including oral mucositis skin reaction fatigue alteration of blood counts possible loss of taste possible xerostomia all were reviewed in detail with the patient.  He comprehends my treatment plan well.  I will set up simulation after his PET scan has been performed.  I would like to take this opportunity to thank you for allowing  me to participate in the care of your patient.Tamela Gammon, MD

## 2023-05-08 ENCOUNTER — Encounter
Admission: RE | Admit: 2023-05-08 | Discharge: 2023-05-08 | Disposition: A | Discharge status: Home / Self Care | Payer: Medicaid Other | Source: Ambulatory Visit | Attending: Radiation Oncology | Admitting: Radiation Oncology

## 2023-05-08 DIAGNOSIS — C040 Malignant neoplasm of anterior floor of mouth: Secondary | ICD-10-CM | POA: Diagnosis present

## 2023-05-08 LAB — GLUCOSE, CAPILLARY: Glucose-Capillary: 125 mg/dL — ABNORMAL HIGH (ref 70–99)

## 2023-05-08 MED ORDER — FLUDEOXYGLUCOSE F - 18 (FDG) INJECTION
7.5000 | Freq: Once | INTRAVENOUS | Status: AC | PRN
  Administered 2023-05-08: 8.14 via INTRAVENOUS

## 2023-05-12 ENCOUNTER — Other Ambulatory Visit: Payer: Self-pay | Admitting: Radiation Oncology

## 2023-05-12 DIAGNOSIS — C040 Malignant neoplasm of anterior floor of mouth: Secondary | ICD-10-CM

## 2023-05-14 ENCOUNTER — Ambulatory Visit
Admission: RE | Admit: 2023-05-14 | Discharge: 2023-05-14 | Disposition: A | Discharge status: Home / Self Care | Payer: Medicaid Other | Source: Ambulatory Visit | Attending: Radiation Oncology | Admitting: Radiation Oncology

## 2023-05-14 DIAGNOSIS — C040 Malignant neoplasm of anterior floor of mouth: Secondary | ICD-10-CM

## 2023-05-14 DIAGNOSIS — Z510 Encounter for antineoplastic radiation therapy: Secondary | ICD-10-CM | POA: Diagnosis present

## 2023-05-15 ENCOUNTER — Other Ambulatory Visit: Payer: Self-pay | Admitting: *Deleted

## 2023-05-15 DIAGNOSIS — C040 Malignant neoplasm of anterior floor of mouth: Secondary | ICD-10-CM

## 2023-05-21 ENCOUNTER — Ambulatory Visit: Admission: RE | Admit: 2023-05-21 | Payer: Medicaid Other | Source: Ambulatory Visit

## 2023-05-25 ENCOUNTER — Ambulatory Visit
Admission: RE | Admit: 2023-05-25 | Discharge: 2023-05-25 | Disposition: A | Discharge status: Home / Self Care | Payer: Medicaid Other | Source: Ambulatory Visit | Attending: Radiation Oncology | Admitting: Radiation Oncology

## 2023-05-26 ENCOUNTER — Ambulatory Visit: Payer: Medicaid Other | Admitting: Internal Medicine

## 2023-05-26 ENCOUNTER — Ambulatory Visit
Admission: RE | Admit: 2023-05-26 | Discharge: 2023-05-26 | Disposition: A | Discharge status: Home / Self Care | Payer: Medicaid Other | Source: Ambulatory Visit | Attending: Radiation Oncology | Admitting: Radiation Oncology

## 2023-05-26 ENCOUNTER — Other Ambulatory Visit: Payer: Self-pay

## 2023-05-26 ENCOUNTER — Encounter: Payer: Self-pay | Admitting: Internal Medicine

## 2023-05-26 VITALS — BP 110/70 | HR 134 | Temp 98.4°F | Resp 16 | Ht 71.0 in | Wt 150.0 lb

## 2023-05-26 DIAGNOSIS — Z510 Encounter for antineoplastic radiation therapy: Secondary | ICD-10-CM | POA: Diagnosis not present

## 2023-05-26 DIAGNOSIS — C069 Malignant neoplasm of mouth, unspecified: Secondary | ICD-10-CM | POA: Diagnosis not present

## 2023-05-26 DIAGNOSIS — G4736 Sleep related hypoventilation in conditions classified elsewhere: Secondary | ICD-10-CM

## 2023-05-26 DIAGNOSIS — J441 Chronic obstructive pulmonary disease with (acute) exacerbation: Secondary | ICD-10-CM | POA: Diagnosis not present

## 2023-05-26 DIAGNOSIS — J449 Chronic obstructive pulmonary disease, unspecified: Secondary | ICD-10-CM

## 2023-05-26 LAB — RAD ONC ARIA SESSION SUMMARY
Course Elapsed Days: 0
Plan Fractions Treated to Date: 1
Plan Prescribed Dose Per Fraction: 2 Gy
Plan Total Fractions Prescribed: 30
Plan Total Prescribed Dose: 60 Gy
Reference Point Dosage Given to Date: 2 Gy
Reference Point Session Dosage Given: 2 Gy
Session Number: 1
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
## 2023-05-27 ENCOUNTER — Ambulatory Visit
Admission: RE | Admit: 2023-05-27 | Discharge: 2023-05-27 | Disposition: A | Discharge status: Home / Self Care | Payer: Medicaid Other | Source: Ambulatory Visit | Attending: Radiation Oncology | Admitting: Radiation Oncology

## 2023-05-27 ENCOUNTER — Inpatient Hospital Stay: Payer: Medicaid Other | Attending: Radiation Oncology

## 2023-05-27 ENCOUNTER — Other Ambulatory Visit: Payer: Self-pay

## 2023-05-27 DIAGNOSIS — Z510 Encounter for antineoplastic radiation therapy: Secondary | ICD-10-CM | POA: Diagnosis present

## 2023-05-27 DIAGNOSIS — C040 Malignant neoplasm of anterior floor of mouth: Secondary | ICD-10-CM | POA: Diagnosis present

## 2023-05-27 LAB — CBC (CANCER CENTER ONLY)
HCT: 39.6 % (ref 39.0–52.0)
Hemoglobin: 12.7 g/dL — ABNORMAL LOW (ref 13.0–17.0)
MCH: 30.5 pg (ref 26.0–34.0)
MCHC: 32.1 g/dL (ref 30.0–36.0)
MCV: 95 fL (ref 80.0–100.0)
Platelet Count: 244 10*3/uL (ref 150–400)
RBC: 4.17 MIL/uL — ABNORMAL LOW (ref 4.22–5.81)
RDW: 15.2 % (ref 11.5–15.5)
WBC Count: 6.6 10*3/uL (ref 4.0–10.5)
nRBC: 0 % (ref 0.0–0.2)

## 2023-05-27 LAB — RAD ONC ARIA SESSION SUMMARY
Course Elapsed Days: 1
Plan Fractions Treated to Date: 2
Plan Prescribed Dose Per Fraction: 2 Gy
Plan Total Fractions Prescribed: 30
Plan Total Prescribed Dose: 60 Gy
Reference Point Dosage Given to Date: 4 Gy
Reference Point Session Dosage Given: 2 Gy
Session Number: 2

## 2023-06-01 ENCOUNTER — Ambulatory Visit
Admission: RE | Admit: 2023-06-01 | Discharge: 2023-06-01 | Disposition: A | Discharge status: Home / Self Care | Payer: Medicaid Other | Source: Ambulatory Visit | Attending: Radiation Oncology | Admitting: Radiation Oncology

## 2023-06-01 ENCOUNTER — Other Ambulatory Visit: Payer: Self-pay

## 2023-06-01 DIAGNOSIS — C040 Malignant neoplasm of anterior floor of mouth: Secondary | ICD-10-CM | POA: Diagnosis present

## 2023-06-01 DIAGNOSIS — Z510 Encounter for antineoplastic radiation therapy: Secondary | ICD-10-CM | POA: Diagnosis present

## 2023-06-01 LAB — RAD ONC ARIA SESSION SUMMARY
Course Elapsed Days: 6
Plan Fractions Treated to Date: 3
Plan Prescribed Dose Per Fraction: 2 Gy
Plan Total Fractions Prescribed: 30
Plan Total Prescribed Dose: 60 Gy
Reference Point Dosage Given to Date: 6 Gy
Reference Point Session Dosage Given: 2 Gy
Session Number: 3

## 2023-06-02 ENCOUNTER — Other Ambulatory Visit: Payer: Self-pay

## 2023-06-02 ENCOUNTER — Ambulatory Visit
Admission: RE | Admit: 2023-06-02 | Discharge: 2023-06-02 | Disposition: A | Discharge status: Home / Self Care | Payer: Medicaid Other | Source: Ambulatory Visit | Attending: Radiation Oncology | Admitting: Radiation Oncology

## 2023-06-02 DIAGNOSIS — Z510 Encounter for antineoplastic radiation therapy: Secondary | ICD-10-CM | POA: Diagnosis not present

## 2023-06-02 LAB — RAD ONC ARIA SESSION SUMMARY
Course Elapsed Days: 7
Plan Fractions Treated to Date: 4
Plan Prescribed Dose Per Fraction: 2 Gy
Plan Total Fractions Prescribed: 30
Plan Total Prescribed Dose: 60 Gy
Reference Point Dosage Given to Date: 8 Gy
Reference Point Session Dosage Given: 2 Gy
Session Number: 4

## 2023-06-03 ENCOUNTER — Other Ambulatory Visit: Payer: Self-pay

## 2023-06-03 ENCOUNTER — Inpatient Hospital Stay: Payer: Medicaid Other

## 2023-06-03 ENCOUNTER — Ambulatory Visit
Admission: RE | Admit: 2023-06-03 | Discharge: 2023-06-03 | Disposition: A | Discharge status: Home / Self Care | Payer: Medicaid Other | Source: Ambulatory Visit | Attending: Radiation Oncology | Admitting: Radiation Oncology

## 2023-06-03 DIAGNOSIS — C040 Malignant neoplasm of anterior floor of mouth: Secondary | ICD-10-CM | POA: Insufficient documentation

## 2023-06-03 DIAGNOSIS — Z510 Encounter for antineoplastic radiation therapy: Secondary | ICD-10-CM | POA: Diagnosis not present

## 2023-06-03 DIAGNOSIS — Z79899 Other long term (current) drug therapy: Secondary | ICD-10-CM | POA: Insufficient documentation

## 2023-06-03 LAB — RAD ONC ARIA SESSION SUMMARY
Course Elapsed Days: 8
Plan Fractions Treated to Date: 5
Plan Prescribed Dose Per Fraction: 2 Gy
Plan Total Fractions Prescribed: 30
Plan Total Prescribed Dose: 60 Gy
Reference Point Dosage Given to Date: 10 Gy
Reference Point Session Dosage Given: 2 Gy
Session Number: 5

## 2023-06-03 LAB — CBC (CANCER CENTER ONLY)
HCT: 41.2 % (ref 39.0–52.0)
Hemoglobin: 13.2 g/dL (ref 13.0–17.0)
MCH: 30.7 pg (ref 26.0–34.0)
MCHC: 32 g/dL (ref 30.0–36.0)
MCV: 95.8 fL (ref 80.0–100.0)
Platelet Count: 177 10*3/uL (ref 150–400)
RBC: 4.3 MIL/uL (ref 4.22–5.81)
RDW: 15.1 % (ref 11.5–15.5)
WBC Count: 4.8 10*3/uL (ref 4.0–10.5)
nRBC: 0 % (ref 0.0–0.2)

## 2023-06-04 ENCOUNTER — Ambulatory Visit
Admission: RE | Admit: 2023-06-04 | Discharge: 2023-06-04 | Disposition: A | Discharge status: Home / Self Care | Payer: Medicaid Other | Source: Ambulatory Visit | Attending: Radiation Oncology | Admitting: Radiation Oncology

## 2023-06-04 ENCOUNTER — Other Ambulatory Visit: Payer: Self-pay

## 2023-06-04 DIAGNOSIS — Z510 Encounter for antineoplastic radiation therapy: Secondary | ICD-10-CM | POA: Diagnosis not present

## 2023-06-04 LAB — RAD ONC ARIA SESSION SUMMARY
Course Elapsed Days: 9
Plan Fractions Treated to Date: 6
Plan Prescribed Dose Per Fraction: 2 Gy
Plan Total Fractions Prescribed: 30
Plan Total Prescribed Dose: 60 Gy
Reference Point Dosage Given to Date: 12 Gy
Reference Point Session Dosage Given: 2 Gy
Session Number: 6

## 2023-06-05 ENCOUNTER — Ambulatory Visit
Admission: RE | Admit: 2023-06-05 | Discharge: 2023-06-05 | Disposition: A | Discharge status: Home / Self Care | Payer: Medicaid Other | Source: Ambulatory Visit | Attending: Radiation Oncology | Admitting: Radiation Oncology

## 2023-06-05 ENCOUNTER — Other Ambulatory Visit: Payer: Self-pay

## 2023-06-05 DIAGNOSIS — Z510 Encounter for antineoplastic radiation therapy: Secondary | ICD-10-CM | POA: Diagnosis not present

## 2023-06-05 LAB — RAD ONC ARIA SESSION SUMMARY
Course Elapsed Days: 10
Plan Fractions Treated to Date: 7
Plan Prescribed Dose Per Fraction: 2 Gy
Plan Total Fractions Prescribed: 30
Plan Total Prescribed Dose: 60 Gy
Reference Point Dosage Given to Date: 14 Gy
Reference Point Session Dosage Given: 2 Gy
Session Number: 7

## 2023-06-08 ENCOUNTER — Ambulatory Visit
Admission: RE | Admit: 2023-06-08 | Discharge: 2023-06-08 | Disposition: A | Discharge status: Home / Self Care | Payer: Medicaid Other | Source: Ambulatory Visit | Attending: Radiation Oncology | Admitting: Radiation Oncology

## 2023-06-08 ENCOUNTER — Other Ambulatory Visit: Payer: Self-pay

## 2023-06-08 DIAGNOSIS — Z510 Encounter for antineoplastic radiation therapy: Secondary | ICD-10-CM | POA: Diagnosis not present

## 2023-06-08 LAB — RAD ONC ARIA SESSION SUMMARY
Course Elapsed Days: 13
Plan Fractions Treated to Date: 8
Plan Prescribed Dose Per Fraction: 2 Gy
Plan Total Fractions Prescribed: 30
Plan Total Prescribed Dose: 60 Gy
Reference Point Dosage Given to Date: 16 Gy
Reference Point Session Dosage Given: 2 Gy
Session Number: 8

## 2023-06-09 ENCOUNTER — Other Ambulatory Visit: Payer: Self-pay

## 2023-06-09 ENCOUNTER — Other Ambulatory Visit: Payer: Self-pay | Admitting: *Deleted

## 2023-06-09 ENCOUNTER — Ambulatory Visit
Admission: RE | Admit: 2023-06-09 | Discharge: 2023-06-09 | Disposition: A | Discharge status: Home / Self Care | Payer: Medicaid Other | Source: Ambulatory Visit | Attending: Radiation Oncology | Admitting: Radiation Oncology

## 2023-06-09 DIAGNOSIS — Z510 Encounter for antineoplastic radiation therapy: Secondary | ICD-10-CM | POA: Diagnosis not present

## 2023-06-09 LAB — RAD ONC ARIA SESSION SUMMARY
Course Elapsed Days: 14
Plan Fractions Treated to Date: 9
Plan Prescribed Dose Per Fraction: 2 Gy
Plan Total Fractions Prescribed: 30
Plan Total Prescribed Dose: 60 Gy
Reference Point Dosage Given to Date: 18 Gy
Reference Point Session Dosage Given: 2 Gy
Session Number: 9

## 2023-06-09 MED ORDER — SUCRALFATE 1 G PO TABS: 1.0000 g | ORAL_TABLET | Freq: Three times a day (TID) | ORAL | 1 refills | Status: DC

## 2023-06-10 ENCOUNTER — Ambulatory Visit
Admission: RE | Admit: 2023-06-10 | Discharge: 2023-06-10 | Disposition: A | Discharge status: Home / Self Care | Payer: Medicaid Other | Source: Ambulatory Visit | Attending: Radiation Oncology | Admitting: Radiation Oncology

## 2023-06-10 ENCOUNTER — Other Ambulatory Visit: Payer: Self-pay

## 2023-06-10 ENCOUNTER — Inpatient Hospital Stay: Payer: Medicaid Other

## 2023-06-10 DIAGNOSIS — Z510 Encounter for antineoplastic radiation therapy: Secondary | ICD-10-CM | POA: Diagnosis not present

## 2023-06-10 DIAGNOSIS — C040 Malignant neoplasm of anterior floor of mouth: Secondary | ICD-10-CM

## 2023-06-10 LAB — RAD ONC ARIA SESSION SUMMARY
Course Elapsed Days: 15
Plan Fractions Treated to Date: 10
Plan Prescribed Dose Per Fraction: 2 Gy
Plan Total Fractions Prescribed: 30
Plan Total Prescribed Dose: 60 Gy
Reference Point Dosage Given to Date: 20 Gy
Reference Point Session Dosage Given: 2 Gy
Session Number: 10

## 2023-06-10 LAB — CBC (CANCER CENTER ONLY)
HCT: 43 % (ref 39.0–52.0)
Hemoglobin: 14 g/dL (ref 13.0–17.0)
MCH: 30.4 pg (ref 26.0–34.0)
MCHC: 32.6 g/dL (ref 30.0–36.0)
MCV: 93.5 fL (ref 80.0–100.0)
Platelet Count: 194 10*3/uL (ref 150–400)
RBC: 4.6 MIL/uL (ref 4.22–5.81)
RDW: 14.9 % (ref 11.5–15.5)
WBC Count: 4.5 10*3/uL (ref 4.0–10.5)
nRBC: 0 % (ref 0.0–0.2)

## 2023-06-11 ENCOUNTER — Other Ambulatory Visit: Payer: Self-pay

## 2023-06-11 ENCOUNTER — Ambulatory Visit
Admission: RE | Admit: 2023-06-11 | Discharge: 2023-06-11 | Disposition: A | Discharge status: Home / Self Care | Payer: Medicaid Other | Source: Ambulatory Visit | Attending: Radiation Oncology | Admitting: Radiation Oncology

## 2023-06-11 DIAGNOSIS — Z510 Encounter for antineoplastic radiation therapy: Secondary | ICD-10-CM | POA: Diagnosis not present

## 2023-06-11 LAB — RAD ONC ARIA SESSION SUMMARY
Course Elapsed Days: 16
Plan Fractions Treated to Date: 11
Plan Prescribed Dose Per Fraction: 2 Gy
Plan Total Fractions Prescribed: 30
Plan Total Prescribed Dose: 60 Gy
Reference Point Dosage Given to Date: 22 Gy
Reference Point Session Dosage Given: 2 Gy
Session Number: 11

## 2023-06-12 ENCOUNTER — Other Ambulatory Visit: Payer: Self-pay

## 2023-06-12 ENCOUNTER — Ambulatory Visit
Admission: RE | Admit: 2023-06-12 | Discharge: 2023-06-12 | Disposition: A | Discharge status: Home / Self Care | Payer: Medicaid Other | Source: Ambulatory Visit | Attending: Radiation Oncology | Admitting: Radiation Oncology

## 2023-06-12 DIAGNOSIS — Z510 Encounter for antineoplastic radiation therapy: Secondary | ICD-10-CM | POA: Diagnosis not present

## 2023-06-12 LAB — RAD ONC ARIA SESSION SUMMARY
Course Elapsed Days: 17
Plan Fractions Treated to Date: 12
Plan Prescribed Dose Per Fraction: 2 Gy
Plan Total Fractions Prescribed: 30
Plan Total Prescribed Dose: 60 Gy
Reference Point Dosage Given to Date: 24 Gy
Reference Point Session Dosage Given: 2 Gy
Session Number: 12

## 2023-06-15 ENCOUNTER — Ambulatory Visit
Admission: RE | Admit: 2023-06-15 | Discharge: 2023-06-15 | Disposition: A | Discharge status: Home / Self Care | Payer: Medicaid Other | Source: Ambulatory Visit | Attending: Radiation Oncology | Admitting: Radiation Oncology

## 2023-06-15 ENCOUNTER — Other Ambulatory Visit: Payer: Self-pay

## 2023-06-15 DIAGNOSIS — Z510 Encounter for antineoplastic radiation therapy: Secondary | ICD-10-CM | POA: Diagnosis not present

## 2023-06-15 LAB — RAD ONC ARIA SESSION SUMMARY
Course Elapsed Days: 20
Plan Fractions Treated to Date: 13
Plan Prescribed Dose Per Fraction: 2 Gy
Plan Total Fractions Prescribed: 30
Plan Total Prescribed Dose: 60 Gy
Reference Point Dosage Given to Date: 26 Gy
Reference Point Session Dosage Given: 2 Gy
Session Number: 13

## 2023-06-16 ENCOUNTER — Other Ambulatory Visit: Payer: Self-pay | Admitting: *Deleted

## 2023-06-16 ENCOUNTER — Ambulatory Visit
Admission: RE | Admit: 2023-06-16 | Discharge: 2023-06-16 | Disposition: A | Discharge status: Home / Self Care | Payer: Medicaid Other | Source: Ambulatory Visit | Attending: Radiation Oncology | Admitting: Radiation Oncology

## 2023-06-16 ENCOUNTER — Other Ambulatory Visit: Payer: Self-pay

## 2023-06-16 DIAGNOSIS — C040 Malignant neoplasm of anterior floor of mouth: Secondary | ICD-10-CM

## 2023-06-16 DIAGNOSIS — Z510 Encounter for antineoplastic radiation therapy: Secondary | ICD-10-CM | POA: Diagnosis not present

## 2023-06-16 LAB — RAD ONC ARIA SESSION SUMMARY
Course Elapsed Days: 21
Plan Fractions Treated to Date: 14
Plan Prescribed Dose Per Fraction: 2 Gy
Plan Total Fractions Prescribed: 30
Plan Total Prescribed Dose: 60 Gy
Reference Point Dosage Given to Date: 28 Gy
Reference Point Session Dosage Given: 2 Gy
Session Number: 14

## 2023-06-17 ENCOUNTER — Other Ambulatory Visit: Payer: Self-pay

## 2023-06-17 ENCOUNTER — Inpatient Hospital Stay: Payer: Medicaid Other

## 2023-06-17 ENCOUNTER — Ambulatory Visit
Admission: RE | Admit: 2023-06-17 | Discharge: 2023-06-17 | Disposition: A | Discharge status: Home / Self Care | Payer: Medicaid Other | Source: Ambulatory Visit | Attending: Radiation Oncology | Admitting: Radiation Oncology

## 2023-06-17 DIAGNOSIS — Z510 Encounter for antineoplastic radiation therapy: Secondary | ICD-10-CM | POA: Diagnosis not present

## 2023-06-17 DIAGNOSIS — C040 Malignant neoplasm of anterior floor of mouth: Secondary | ICD-10-CM

## 2023-06-17 LAB — CBC (CANCER CENTER ONLY)
HCT: 41.9 % (ref 39.0–52.0)
Hemoglobin: 13.9 g/dL (ref 13.0–17.0)
MCH: 30.9 pg (ref 26.0–34.0)
MCHC: 33.2 g/dL (ref 30.0–36.0)
MCV: 93.1 fL (ref 80.0–100.0)
Platelet Count: 160 10*3/uL (ref 150–400)
RBC: 4.5 MIL/uL (ref 4.22–5.81)
RDW: 15.2 % (ref 11.5–15.5)
WBC Count: 4.6 10*3/uL (ref 4.0–10.5)
nRBC: 0 % (ref 0.0–0.2)

## 2023-06-17 LAB — RAD ONC ARIA SESSION SUMMARY
Course Elapsed Days: 22
Plan Fractions Treated to Date: 15
Plan Prescribed Dose Per Fraction: 2 Gy
Plan Total Fractions Prescribed: 30
Plan Total Prescribed Dose: 60 Gy
Reference Point Dosage Given to Date: 30 Gy
Reference Point Session Dosage Given: 2 Gy
Session Number: 15

## 2023-06-18 ENCOUNTER — Other Ambulatory Visit: Payer: Self-pay | Admitting: Nurse Practitioner

## 2023-06-18 ENCOUNTER — Ambulatory Visit
Admission: RE | Admit: 2023-06-18 | Discharge: 2023-06-18 | Disposition: A | Discharge status: Home / Self Care | Payer: Medicaid Other | Source: Ambulatory Visit | Attending: Radiation Oncology | Admitting: Radiation Oncology

## 2023-06-18 ENCOUNTER — Other Ambulatory Visit: Payer: Self-pay

## 2023-06-18 DIAGNOSIS — F17219 Nicotine dependence, cigarettes, with unspecified nicotine-induced disorders: Secondary | ICD-10-CM

## 2023-06-18 DIAGNOSIS — Z510 Encounter for antineoplastic radiation therapy: Secondary | ICD-10-CM | POA: Diagnosis not present

## 2023-06-18 LAB — RAD ONC ARIA SESSION SUMMARY
Course Elapsed Days: 23
Plan Fractions Treated to Date: 16
Plan Prescribed Dose Per Fraction: 2 Gy
Plan Total Fractions Prescribed: 30
Plan Total Prescribed Dose: 60 Gy
Reference Point Dosage Given to Date: 32 Gy
Reference Point Session Dosage Given: 2 Gy
Session Number: 16

## 2023-06-19 ENCOUNTER — Other Ambulatory Visit: Payer: Self-pay

## 2023-06-19 ENCOUNTER — Telehealth: Payer: Self-pay

## 2023-06-19 ENCOUNTER — Telehealth: Payer: Self-pay | Admitting: Internal Medicine

## 2023-06-19 ENCOUNTER — Ambulatory Visit
Admission: RE | Admit: 2023-06-19 | Discharge: 2023-06-19 | Disposition: A | Discharge status: Home / Self Care | Payer: Medicaid Other | Source: Ambulatory Visit | Attending: Radiation Oncology | Admitting: Radiation Oncology

## 2023-06-19 DIAGNOSIS — Z510 Encounter for antineoplastic radiation therapy: Secondary | ICD-10-CM | POA: Diagnosis not present

## 2023-06-19 LAB — RAD ONC ARIA SESSION SUMMARY
Course Elapsed Days: 24
Plan Fractions Treated to Date: 17
Plan Prescribed Dose Per Fraction: 2 Gy
Plan Total Fractions Prescribed: 30
Plan Total Prescribed Dose: 60 Gy
Reference Point Dosage Given to Date: 34 Gy
Reference Point Session Dosage Given: 2 Gy
Session Number: 17

## 2023-06-19 NOTE — Telephone Encounter (Signed)
Notified Hunt Oris & Elease Hashimoto with [employer] of overnight pulseox order-Toni

## 2023-06-19 NOTE — Telephone Encounter (Signed)
Spoke with Perry Bullock from [employer] they will call pt wife and do service for oxygen  concentrator asap and also advised  wife that they will call her

## 2023-06-22 ENCOUNTER — Ambulatory Visit
Admission: RE | Admit: 2023-06-22 | Discharge: 2023-06-22 | Disposition: A | Discharge status: Home / Self Care | Payer: Medicaid Other | Source: Ambulatory Visit | Attending: Radiation Oncology | Admitting: Radiation Oncology

## 2023-06-22 ENCOUNTER — Other Ambulatory Visit: Payer: Self-pay

## 2023-06-22 DIAGNOSIS — Z510 Encounter for antineoplastic radiation therapy: Secondary | ICD-10-CM | POA: Diagnosis not present

## 2023-06-22 LAB — RAD ONC ARIA SESSION SUMMARY
Course Elapsed Days: 27
Plan Fractions Treated to Date: 18
Plan Prescribed Dose Per Fraction: 2 Gy
Plan Total Fractions Prescribed: 30
Plan Total Prescribed Dose: 60 Gy
Reference Point Dosage Given to Date: 36 Gy
Reference Point Session Dosage Given: 2 Gy
Session Number: 18

## 2023-06-23 ENCOUNTER — Other Ambulatory Visit: Payer: Self-pay

## 2023-06-23 ENCOUNTER — Ambulatory Visit
Admission: RE | Admit: 2023-06-23 | Discharge: 2023-06-23 | Disposition: A | Discharge status: Home / Self Care | Payer: Medicaid Other | Source: Ambulatory Visit | Attending: Radiation Oncology | Admitting: Radiation Oncology

## 2023-06-23 DIAGNOSIS — Z510 Encounter for antineoplastic radiation therapy: Secondary | ICD-10-CM | POA: Diagnosis not present

## 2023-06-23 LAB — RAD ONC ARIA SESSION SUMMARY
Course Elapsed Days: 28
Plan Fractions Treated to Date: 19
Plan Prescribed Dose Per Fraction: 2 Gy
Plan Total Fractions Prescribed: 30
Plan Total Prescribed Dose: 60 Gy
Reference Point Dosage Given to Date: 38 Gy
Reference Point Session Dosage Given: 2 Gy
Session Number: 19

## 2023-06-25 ENCOUNTER — Other Ambulatory Visit: Payer: Self-pay

## 2023-06-25 ENCOUNTER — Ambulatory Visit
Admission: RE | Admit: 2023-06-25 | Discharge: 2023-06-25 | Disposition: A | Discharge status: Home / Self Care | Payer: Medicaid Other | Source: Ambulatory Visit | Attending: Radiation Oncology | Admitting: Radiation Oncology

## 2023-06-25 ENCOUNTER — Inpatient Hospital Stay: Payer: Medicaid Other

## 2023-06-25 DIAGNOSIS — C040 Malignant neoplasm of anterior floor of mouth: Secondary | ICD-10-CM

## 2023-06-25 DIAGNOSIS — Z510 Encounter for antineoplastic radiation therapy: Secondary | ICD-10-CM | POA: Diagnosis not present

## 2023-06-25 LAB — RAD ONC ARIA SESSION SUMMARY
Course Elapsed Days: 30
Plan Fractions Treated to Date: 20
Plan Prescribed Dose Per Fraction: 2 Gy
Plan Total Fractions Prescribed: 30
Plan Total Prescribed Dose: 60 Gy
Reference Point Dosage Given to Date: 40 Gy
Reference Point Session Dosage Given: 2 Gy
Session Number: 20

## 2023-06-25 LAB — CBC (CANCER CENTER ONLY)
HCT: 45.7 % (ref 39.0–52.0)
Hemoglobin: 15.4 g/dL (ref 13.0–17.0)
MCH: 31.4 pg (ref 26.0–34.0)
MCHC: 33.7 g/dL (ref 30.0–36.0)
MCV: 93.3 fL (ref 80.0–100.0)
Platelet Count: 188 10*3/uL (ref 150–400)
RBC: 4.9 MIL/uL (ref 4.22–5.81)
RDW: 14.6 % (ref 11.5–15.5)
WBC Count: 5.1 10*3/uL (ref 4.0–10.5)
nRBC: 0 % (ref 0.0–0.2)

## 2023-06-26 ENCOUNTER — Other Ambulatory Visit: Payer: Self-pay

## 2023-06-26 ENCOUNTER — Ambulatory Visit
Admission: RE | Admit: 2023-06-26 | Discharge: 2023-06-26 | Disposition: A | Discharge status: Home / Self Care | Payer: Medicaid Other | Source: Ambulatory Visit | Attending: Radiation Oncology | Admitting: Radiation Oncology

## 2023-06-26 DIAGNOSIS — Z510 Encounter for antineoplastic radiation therapy: Secondary | ICD-10-CM | POA: Diagnosis not present

## 2023-06-26 LAB — RAD ONC ARIA SESSION SUMMARY
Course Elapsed Days: 31
Plan Fractions Treated to Date: 21
Plan Prescribed Dose Per Fraction: 2 Gy
Plan Total Fractions Prescribed: 30
Plan Total Prescribed Dose: 60 Gy
Reference Point Dosage Given to Date: 42 Gy
Reference Point Session Dosage Given: 2 Gy
Session Number: 21

## 2023-06-29 ENCOUNTER — Ambulatory Visit
Admission: RE | Admit: 2023-06-29 | Discharge: 2023-06-29 | Disposition: A | Discharge status: Home / Self Care | Payer: Medicaid Other | Source: Ambulatory Visit | Attending: Radiation Oncology | Admitting: Radiation Oncology

## 2023-06-29 ENCOUNTER — Other Ambulatory Visit: Payer: Self-pay

## 2023-06-29 ENCOUNTER — Other Ambulatory Visit: Payer: Self-pay | Admitting: *Deleted

## 2023-06-29 DIAGNOSIS — Z510 Encounter for antineoplastic radiation therapy: Secondary | ICD-10-CM | POA: Diagnosis not present

## 2023-06-29 LAB — RAD ONC ARIA SESSION SUMMARY
Course Elapsed Days: 34
Plan Fractions Treated to Date: 22
Plan Prescribed Dose Per Fraction: 2 Gy
Plan Total Fractions Prescribed: 30
Plan Total Prescribed Dose: 60 Gy
Reference Point Dosage Given to Date: 44 Gy
Reference Point Session Dosage Given: 2 Gy
Session Number: 22

## 2023-06-29 MED ORDER — FLUCONAZOLE 100 MG PO TABS
100.0000 mg | ORAL_TABLET | Freq: Every day | ORAL | 0 refills | Status: AC
Start: 2023-06-29 — End: 2023-07-13

## 2023-06-30 ENCOUNTER — Ambulatory Visit
Admission: RE | Admit: 2023-06-30 | Discharge: 2023-06-30 | Disposition: A | Discharge status: Home / Self Care | Payer: Medicaid Other | Source: Ambulatory Visit | Attending: Radiation Oncology | Admitting: Radiation Oncology

## 2023-06-30 ENCOUNTER — Other Ambulatory Visit: Payer: Self-pay

## 2023-06-30 DIAGNOSIS — Z510 Encounter for antineoplastic radiation therapy: Secondary | ICD-10-CM | POA: Diagnosis not present

## 2023-06-30 LAB — RAD ONC ARIA SESSION SUMMARY
Course Elapsed Days: 35
Plan Fractions Treated to Date: 23
Plan Prescribed Dose Per Fraction: 2 Gy
Plan Total Fractions Prescribed: 30
Plan Total Prescribed Dose: 60 Gy
Reference Point Dosage Given to Date: 46 Gy
Reference Point Session Dosage Given: 2 Gy
Session Number: 23

## 2023-07-02 ENCOUNTER — Other Ambulatory Visit: Payer: Self-pay | Admitting: *Deleted

## 2023-07-02 ENCOUNTER — Inpatient Hospital Stay: Payer: Medicaid Other

## 2023-07-02 ENCOUNTER — Ambulatory Visit
Admission: RE | Admit: 2023-07-02 | Discharge: 2023-07-02 | Disposition: A | Discharge status: Home / Self Care | Payer: Medicaid Other | Source: Ambulatory Visit | Attending: Radiation Oncology | Admitting: Radiation Oncology

## 2023-07-02 ENCOUNTER — Inpatient Hospital Stay: Payer: Medicaid Other | Attending: Radiation Oncology

## 2023-07-02 ENCOUNTER — Other Ambulatory Visit: Payer: Self-pay

## 2023-07-02 DIAGNOSIS — C040 Malignant neoplasm of anterior floor of mouth: Secondary | ICD-10-CM

## 2023-07-02 DIAGNOSIS — Z510 Encounter for antineoplastic radiation therapy: Secondary | ICD-10-CM | POA: Insufficient documentation

## 2023-07-02 LAB — CBC (CANCER CENTER ONLY)
HCT: 42.6 % (ref 39.0–52.0)
Hemoglobin: 14.3 g/dL (ref 13.0–17.0)
MCH: 31.2 pg (ref 26.0–34.0)
MCHC: 33.6 g/dL (ref 30.0–36.0)
MCV: 93 fL (ref 80.0–100.0)
Platelet Count: 202 10*3/uL (ref 150–400)
RBC: 4.58 MIL/uL (ref 4.22–5.81)
RDW: 14.8 % (ref 11.5–15.5)
WBC Count: 5.7 10*3/uL (ref 4.0–10.5)
nRBC: 0 % (ref 0.0–0.2)

## 2023-07-02 LAB — RAD ONC ARIA SESSION SUMMARY
Course Elapsed Days: 37
Plan Fractions Treated to Date: 24
Plan Prescribed Dose Per Fraction: 2 Gy
Plan Total Fractions Prescribed: 30
Plan Total Prescribed Dose: 60 Gy
Reference Point Dosage Given to Date: 48 Gy
Reference Point Session Dosage Given: 2 Gy
Session Number: 24

## 2023-07-02 NOTE — Progress Notes (Addendum)
 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  S/p composite resection, with bilateral neck dissections, tracheotomy, and scapula chimeric free flap on 02/19/23 by Dr Denys at [facility].  Patient with PEG tube.  Patient with prolonged hospital course.  Currently receiving radiation locally.  Last radiation treatment planned for 1/10.  Met with patient and significant other/friend Jon.  Jon provided most of information.  Reports that he has not received tube feeding since 04/30/23 when left rehab.  Jon reports that he was not set up to receive enteral nutrition at home at discharge.  Patient has been eating orally.  Intake has decreased due to side effects from radiation (dysphagia, sore mouth, thrush).  Yesterday able to eat chicken and stars soup, Equate shake.  Jon reports patient takes sips of beer. Reports diarrhea but unsure when it started.    Noted per Care Everywhere patient was on nepro 55ml/hr (8pm-6 am) during hospital admission.  Jon unsure if he was ever on another formula. Also noted SLP recommendation for puree diet, thin liquids.    NFPE: deferred today  Medications: sucralfate , fluconazole , thiamine , zofran , MVI, pantoprazole    Labs: CBC drawn today all WNL   Anthropometrics:   Height: 71 inches Weight: 135 lb 9 oz today 150 lb on 11/26 at [facility] office BMI: 18  10% weight loss in the last month and 1 week, significant  Estimated Energy Needs  Kcals: 1525-1830  Protein: 73-91 g Fluid: 1525-1830 ml   NUTRITION DIAGNOSIS: Inadequate oral intake related to side effects from radiation as evidenced by 10% weight loss in the last month and 1 week and decreased oral intake   INTERVENTION:  Dr Lenn agreeable to checking basic metabolic panel, magnesium  and phosphorus.  Labs will be scheduled for tomorrow as unable to pull off labs  from today per lab.  Called and left message for Jon regarding lab draw tomorrow.   Patient was previously on specialty formula (nepro) and need to see lab results before able to make recommendations regarding tube feeding.  Discussed soft, moist foods high in calories and protein.  Handout on High Calorie, High Protein Diet provided as patient eating orally.  Spoke with RD from [facility] for Coordination of Care as patient will be completing radiation on 1/10 and following back up with [facility].  [facility] RD able to have a virtual visit with patient on Tuesday, Jan 7th.   Recommend follow-up with PCP regarding diarrhea.     MONITORING, EVALUATION, GOAL: weight, labs   Next Visit: Tomorrow, Jan 3rd after lab draw  Ngoc Detjen B. Dasie, RD, LDN Registered Dietitian [phone]

## 2023-07-03 ENCOUNTER — Inpatient Hospital Stay: Payer: Medicaid Other

## 2023-07-03 ENCOUNTER — Ambulatory Visit
Admission: RE | Admit: 2023-07-03 | Discharge: 2023-07-03 | Disposition: A | Discharge status: Home / Self Care | Payer: Medicaid Other | Source: Ambulatory Visit | Attending: Radiation Oncology | Admitting: Radiation Oncology

## 2023-07-03 ENCOUNTER — Other Ambulatory Visit: Payer: Self-pay

## 2023-07-03 DIAGNOSIS — C040 Malignant neoplasm of anterior floor of mouth: Secondary | ICD-10-CM

## 2023-07-03 DIAGNOSIS — Z510 Encounter for antineoplastic radiation therapy: Secondary | ICD-10-CM | POA: Diagnosis not present

## 2023-07-03 LAB — RAD ONC ARIA SESSION SUMMARY
Course Elapsed Days: 38
Plan Fractions Treated to Date: 25
Plan Prescribed Dose Per Fraction: 2 Gy
Plan Total Fractions Prescribed: 30
Plan Total Prescribed Dose: 60 Gy
Reference Point Dosage Given to Date: 50 Gy
Reference Point Session Dosage Given: 2 Gy
Session Number: 25

## 2023-07-03 LAB — BASIC METABOLIC PANEL - CANCER CENTER ONLY
Anion gap: 11 (ref 5–15)
BUN: <5 mg/dL — ABNORMAL LOW (ref 8–23)
CO2: 29 mmol/L (ref 22–32)
Calcium: 8.8 mg/dL — ABNORMAL LOW (ref 8.9–10.3)
Chloride: 93 mmol/L — ABNORMAL LOW (ref 98–111)
Creatinine: 0.54 mg/dL — ABNORMAL LOW (ref 0.61–1.24)
GFR, Estimated: >60 mL/min (ref >60)
Glucose, Bld: 93 mg/dL (ref 70–99)
Potassium: 5.1 mmol/L (ref 3.5–5.1)
Sodium: 133 mmol/L — ABNORMAL LOW (ref 135–145)

## 2023-07-03 LAB — MAGNESIUM: Magnesium: 1.8 mg/dL (ref 1.7–2.4)

## 2023-07-03 LAB — PHOSPHORUS: Phosphorus: 4 mg/dL (ref 2.5–4.6)

## 2023-07-03 NOTE — Progress Notes (Addendum)
 Nutrition Follow-up:  Patient with SCC of tongue.  S/p composite resection, with bilateral neck dissections, tracheotomy, and scapula chimeric free flap on 02/19/23 by Dr Denys at [facility].  Patient with PEG tube.  Patient with prolonged hospital course requiring ventilator and rehab stay.  Patient receiving radiation locally and will complete treatments on 07/10/23.  Met with patient and friend, Jon.  Per Jon patient has not received tube feeding since 04/30/23.  No enteral nutrition was set up for patient at discharge.  Patient has been eating orally.  Noted per SLP during admission puree diet with thin liquids recommended.  Patient has been eating orally but overall intake has declined due to mouth pain, thrush, dysphagia from radiation.  Overall intake poor at this time. Yesterday only able to eat chicken and stars soup, Equate shake.   Patient wanting to start enteral nutrition.  Jon reports that patient takes sips of beer.  Also reports that patient has a history of eating disorder.  Patient reports diarrhea but can't tell RD when it started.  Patient reports had one loose stool yesterday, none today.  Takes imodium.    Previously was on nepro during admission and rehab stay  NFPE: Orbital Region: mild Buccal Region: mild Upper Arm Region: severe Thoracic and Lumbar Region: mild Temple Region: mild Clavicle Bone Region: mild Shoulder and Acromion Bone Region: moderate Scapular Bone Region: moderate Dorsal Hand: moderate Patellar Region: mild Anterior Thigh Region: moderate Posterior Calf Region: mild Edema (RD assessment): none   Medications: fluconazole , thiamine  100mg , MVI  Labs: Na 133, K 5.1, Phosphorus 4.0, Magnesium  1.8  Anthropometrics:   Height: 71 inches Weight: 135 lb 9 oz  150 lb on 11/26 at [facility] BMI: 18 10% weight loss in the last month, significant   Estimated Energy Needs  Kcals: 1525-1830 Protein: 73-91 g Fluid: 1525- 1830 ml  NUTRITION DIAGNOSIS:  Inadequate oral intake continues   MALNUTRITION DIAGNOSIS: Patient meets criteria for severe malnutrition in context of acute illness as evidenced by 10% weight loss in the last month, eating less than 50% estimated energy needs and moderate muscle mass loss.    INTERVENTION:  Patient and friend, Jon did not want to stay until all labs resulted today. Asked that RD call and leave message on voicemail with instructions as they are both exhausted. Reviewed how to give bolus feeding with patient and Jon.  Jon states that she is a engineer, civil (consulting) and knows how to do bolus feeding.  Called and left message for Jon that patient does not need to restart a renal (nepro) specific formula at this time.  Can purchase ensure plus/boost plus/equate plus over the weekend and start with 1 carton today.  Patient to add 1 carton daily until reaches 3 cartons daily and continue this until labs checked.  Ideally, goal rate of 5 cartons per day but need slow titration due to refeeding risk.  Explained this on voicemail for [place].  Flush with 60ml of water  before and after each feeding Provides 1750 calories, 80 g protein, free water .  Patient sipping on liquids currently for additional hydration. Requested CMP, Magnesium  and Phosphorus to be rechecked first of next week as patient at risk of refeeding. Message about this left on Angela's voicemail.    RD has communicated with RD from [facility] and she will be following up on Tuesday, Jan 7 as [facility] will be following patient long term.  [facility] RD will be able to get patient set up with DME for enteral supplies  as needed.   Patient has contact information    MONITORING, EVALUATION, GOAL: Weight trends, tube feeding tolerance   NEXT VISIT: next week  Zahira Brummond B. Dasie, RD, LDN Registered Dietitian [phone]

## 2023-07-06 ENCOUNTER — Other Ambulatory Visit: Payer: Self-pay

## 2023-07-06 ENCOUNTER — Ambulatory Visit
Admission: RE | Admit: 2023-07-06 | Discharge: 2023-07-06 | Disposition: A | Discharge status: Home / Self Care | Payer: Medicaid Other | Source: Ambulatory Visit | Attending: Radiation Oncology | Admitting: Radiation Oncology

## 2023-07-06 DIAGNOSIS — Z510 Encounter for antineoplastic radiation therapy: Secondary | ICD-10-CM | POA: Diagnosis not present

## 2023-07-06 LAB — RAD ONC ARIA SESSION SUMMARY
Course Elapsed Days: 41
Plan Fractions Treated to Date: 26
Plan Prescribed Dose Per Fraction: 2 Gy
Plan Total Fractions Prescribed: 30
Plan Total Prescribed Dose: 60 Gy
Reference Point Dosage Given to Date: 52 Gy
Reference Point Session Dosage Given: 2 Gy
Session Number: 26

## 2023-07-07 ENCOUNTER — Other Ambulatory Visit: Payer: Self-pay

## 2023-07-07 ENCOUNTER — Ambulatory Visit
Admission: RE | Admit: 2023-07-07 | Discharge: 2023-07-07 | Disposition: A | Discharge status: Home / Self Care | Payer: Medicaid Other | Source: Ambulatory Visit | Attending: Radiation Oncology | Admitting: Radiation Oncology

## 2023-07-07 DIAGNOSIS — Z510 Encounter for antineoplastic radiation therapy: Secondary | ICD-10-CM | POA: Diagnosis not present

## 2023-07-07 LAB — RAD ONC ARIA SESSION SUMMARY
Course Elapsed Days: 42
Plan Fractions Treated to Date: 27
Plan Prescribed Dose Per Fraction: 2 Gy
Plan Total Fractions Prescribed: 30
Plan Total Prescribed Dose: 60 Gy
Reference Point Dosage Given to Date: 54 Gy
Reference Point Session Dosage Given: 2 Gy
Session Number: 27

## 2023-07-08 ENCOUNTER — Other Ambulatory Visit: Payer: Self-pay

## 2023-07-08 ENCOUNTER — Ambulatory Visit
Admission: RE | Admit: 2023-07-08 | Discharge: 2023-07-08 | Disposition: A | Discharge status: Home / Self Care | Payer: Medicaid Other | Source: Ambulatory Visit | Attending: Radiation Oncology | Admitting: Radiation Oncology

## 2023-07-08 ENCOUNTER — Inpatient Hospital Stay: Payer: Medicaid Other

## 2023-07-08 DIAGNOSIS — Z510 Encounter for antineoplastic radiation therapy: Secondary | ICD-10-CM | POA: Diagnosis not present

## 2023-07-08 DIAGNOSIS — C040 Malignant neoplasm of anterior floor of mouth: Secondary | ICD-10-CM

## 2023-07-08 LAB — CBC (CANCER CENTER ONLY)
HCT: 39.5 % (ref 39.0–52.0)
Hemoglobin: 13.5 g/dL (ref 13.0–17.0)
MCH: 31.2 pg (ref 26.0–34.0)
MCHC: 34.2 g/dL (ref 30.0–36.0)
MCV: 91.2 fL (ref 80.0–100.0)
Platelet Count: 223 10*3/uL (ref 150–400)
RBC: 4.33 MIL/uL (ref 4.22–5.81)
RDW: 14.8 % (ref 11.5–15.5)
WBC Count: 4.8 10*3/uL (ref 4.0–10.5)
nRBC: 0 % (ref 0.0–0.2)

## 2023-07-08 LAB — PHOSPHORUS: Phosphorus: 3.7 mg/dL (ref 2.5–4.6)

## 2023-07-08 LAB — RAD ONC ARIA SESSION SUMMARY
Course Elapsed Days: 43
Plan Fractions Treated to Date: 28
Plan Prescribed Dose Per Fraction: 2 Gy
Plan Total Fractions Prescribed: 30
Plan Total Prescribed Dose: 60 Gy
Reference Point Dosage Given to Date: 56 Gy
Reference Point Session Dosage Given: 2 Gy
Session Number: 28

## 2023-07-08 NOTE — Progress Notes (Signed)
 Nutrition Follow-up:  Patient with SCC of tongue.  S/p composite resection, with bilateral neck dissections, tracheotomy, and scapula chimeric free flap on 02/19/23 by Dr Denys at [facility].  Patient with PEG tube.  Patient with prolonged hospital course requiring ventilator and rehab stay.  Patient to complete radiation on 1/10 and will be following up with [facility].  Not currently followed by Medical Oncology at [facility].  Met with patient and friend, Jon.  Patient did not use any boost plus/ensure plus via tube over the weekend.  Said that he drank a few orally.  Reports that he is eating a little bit more orally (eggs, inside of cheese dog). States that he wants to try and eat more orally and not use feeding tube.  Last week he verbalized that he wanted to start using feeding tube again to RD during visit.   States they have not heard from [facility] Dietitian  Medications: reviewed  Labs: 1/8 Phosphorus 3.7 (WNL) CBC was ordered today and all normal  Anthropometrics:   Weight 135 lb 9 oz on 07/03/2023 150 lb on 11/26 at [facility]  10% weight loss in the last month, significant   Estimated Energy Needs  Kcals: 1525-1830 Protein: 73-91 g Fluid: 1525-1830 ml  NUTRITION DIAGNOSIS: Inadequate oral intake ongoing   INTERVENTION:  Patient states today that he wants to focus on eating more orally.   Consider re-establishing care with [facility] SLP especially with recent radiation Message sent to [facility] RD regarding today's visit and lab results.  [facility] RD to assume care at this time as patient will have limited follow-up in [place].  Patient has contact information    NEXT VISIT: no follow-up  Ellenore Roscoe B. Dasie, RD, LDN Registered Dietitian [phone]

## 2023-07-09 ENCOUNTER — Ambulatory Visit
Admission: RE | Admit: 2023-07-09 | Discharge: 2023-07-09 | Disposition: A | Discharge status: Home / Self Care | Payer: Medicaid Other | Source: Ambulatory Visit | Attending: Radiation Oncology | Admitting: Radiation Oncology

## 2023-07-09 ENCOUNTER — Ambulatory Visit: Payer: Medicaid Other

## 2023-07-09 ENCOUNTER — Other Ambulatory Visit: Payer: Self-pay

## 2023-07-09 DIAGNOSIS — Z510 Encounter for antineoplastic radiation therapy: Secondary | ICD-10-CM | POA: Diagnosis not present

## 2023-07-09 LAB — RAD ONC ARIA SESSION SUMMARY
Course Elapsed Days: 44
Plan Fractions Treated to Date: 29
Plan Prescribed Dose Per Fraction: 2 Gy
Plan Total Fractions Prescribed: 30
Plan Total Prescribed Dose: 60 Gy
Reference Point Dosage Given to Date: 58 Gy
Reference Point Session Dosage Given: 2 Gy
Session Number: 29

## 2023-07-10 ENCOUNTER — Other Ambulatory Visit: Payer: Self-pay

## 2023-07-10 ENCOUNTER — Ambulatory Visit
Admission: RE | Admit: 2023-07-10 | Discharge: 2023-07-10 | Disposition: A | Discharge status: Home / Self Care | Payer: Medicaid Other | Source: Ambulatory Visit | Attending: Radiation Oncology | Admitting: Radiation Oncology

## 2023-07-10 DIAGNOSIS — Z510 Encounter for antineoplastic radiation therapy: Secondary | ICD-10-CM | POA: Diagnosis not present

## 2023-07-10 LAB — RAD ONC ARIA SESSION SUMMARY
Course Elapsed Days: 45
Plan Fractions Treated to Date: 30
Plan Prescribed Dose Per Fraction: 2 Gy
Plan Total Fractions Prescribed: 30
Plan Total Prescribed Dose: 60 Gy
Reference Point Dosage Given to Date: 60 Gy
Reference Point Session Dosage Given: 2 Gy
Session Number: 30

## 2023-07-13 NOTE — Radiation Completion Notes (Signed)
 Patient Name: Perry Bullock, Perry Bullock MRN: [id number] Date of Birth: Apr 22, 1961 Referring Physician: GLENDIA FLOOR, M.D. Date of Service: 2023-07-13 Radiation Oncologist: Marcey Penton, M.D. 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  He had presented with a progressing ulcerative lesion of the anterior floor of mouth and anterior tongue.  He had undergone at [facility] a composite resection floor of mouth tongue and send a segmental mandibulectomy and bilateral neck dissection.  He had a left scapular free flap with microvascular reconstruction.  A at the time of surgery no lymph nodes bilaterally were noted to contain metastatic disease with extensive lymph node sampling of bilateral neck.  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  Surgical margins were negative for tumor although close.  Tumor invaded the cortex and marrow space of the mandibular bone and into the submandibular gland.  His postoperative course was complicated by an extensive hospitalization and ICU admission he had ventilatory dependence as well as permanent feeding tube placement.  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  Patient is now doing fairly well he is continues to have floor of mouth and anterior submandibular pain.  He is also having some trismus and some difficulty with dysphagia.  He is seen today for consideration of postoperative radiation therapy based on the bone involvement and close margins.      ==========DELIVERED PLANS==========  First Treatment Date: 2023-05-26 Last Treatment Date: 2023-07-10   Plan Name: HN:1 Site: Tongue Technique: IMRT Mode:  Photon Dose Per Fraction: 2 Gy Prescribed Dose (Delivered / Prescribed): 60 Gy / 60 Gy Prescribed Fxs (Delivered / Prescribed): 30 / 30     ==========ON TREATMENT VISIT DATES========== 2023-05-26, 2023-06-02, 2023-06-09, 2023-06-16, 2023-06-22, 2023-06-29, 2023-07-07     ==========UPCOMING VISITS==========       ==========APPENDIX - ON TREATMENT VISIT NOTES==========   See weekly On Treatment Notes in Epic for details in the Media tab (listed as Progress notes on the On Treatment Visit Dates listed above).

## 2023-07-16 ENCOUNTER — Other Ambulatory Visit: Payer: Self-pay | Admitting: Nurse Practitioner

## 2023-07-16 DIAGNOSIS — F17219 Nicotine dependence, cigarettes, with unspecified nicotine-induced disorders: Secondary | ICD-10-CM

## 2023-07-16 NOTE — Telephone Encounter (Signed)
Pt wife called for refills

## 2023-07-26 ENCOUNTER — Other Ambulatory Visit: Payer: Self-pay | Admitting: Nurse Practitioner

## 2023-07-26 DIAGNOSIS — R0602 Shortness of breath: Secondary | ICD-10-CM

## 2023-07-26 DIAGNOSIS — J441 Chronic obstructive pulmonary disease with (acute) exacerbation: Secondary | ICD-10-CM

## 2023-08-03 ENCOUNTER — Ambulatory Visit
Admission: RE | Admit: 2023-08-03 | Discharge: 2023-08-03 | Disposition: A | Discharge status: Home / Self Care | Payer: Medicaid Other | Source: Ambulatory Visit | Attending: Radiation Oncology | Admitting: Radiation Oncology

## 2023-08-03 ENCOUNTER — Encounter: Payer: Self-pay | Admitting: Radiation Oncology

## 2023-08-03 VITALS — BP 100/78 | HR 106 | Temp 97.9°F | Resp 16

## 2023-08-03 DIAGNOSIS — C040 Malignant neoplasm of anterior floor of mouth: Secondary | ICD-10-CM | POA: Insufficient documentation

## 2023-08-03 NOTE — Progress Notes (Signed)
Radiation Oncology Follow up Note  Name: Perry Bullock   Date:   08/03/2023 MRN:  [id number] DOB: [birth year]    This 63 y.o. 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  REFERRING PROVIDER: [facility] 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  Seen today in follow-up he is still having problems with his diet continues on a mostly liquid diet and still refuses a feeding tube.  He is having very little head and neck pain.  He states he is not having significant dysphagia although certainly difficult to eat things like meat..  COMPLICATIONS OF TREATMENT: none  FOLLOW UP COMPLIANCE: keeps appointments   PHYSICAL EXAM:  BP 100/78   Pulse (!) 106   Temp 97.9 F (36.6 C) (Tympanic)   Resp 16  Frail debilitated wheelchair-bound male in NAD.  Oral cavity is clear no oral mucosal lesions are identified.  No evidence of mass or nodularity to floor mouth is noted neck is clear although fibrotic without evidence of cervical adenopathy or supraclavicular adenopathy.  Well-developed well-nourished patient in NAD. HEENT reveals PERLA, EOMI, discs not visualized.  Oral cavity is clear. No oral mucosal lesions are identified. Neck is clear without evidence of cervical or supraclavicular adenopathy. Lungs are clear to A&P. Cardiac examination is essentially unremarkable with regular rate and rhythm without murmur rub or thrill. Abdomen is benign with no organomegaly or masses noted. Motor sensory and DTR levels are equal and symmetric in the upper and lower extremities. Cranial nerves II through XII are grossly  intact. Proprioception is intact. No peripheral adenopathy or edema is identified. No motor or sensory levels are noted. Crude visual fields are within normal range.  RADIOLOGY RESULTS: CT scan of the soft tissue head and neck ordered in 3 months  PLAN: Present time patient is doing well recovering nicely from his surgery and radiation therapy treatments.  I have asked to see him back in 3 months for follow-up with repeat CT scan of the soft tissue head and neck.  Patient is being followed at [facility] and they may be performing their own scans there if so I will defer hours.  Patient already has follow-ups at [facility].  Patient and caretaker comprehend my recommendations well.  I would like to take this opportunity to thank you for allowing me to participate in the care of your patient.Carmina Miller, MD

## 2023-08-21 ENCOUNTER — Other Ambulatory Visit: Payer: Self-pay | Admitting: Nurse Practitioner

## 2023-08-21 DIAGNOSIS — R0602 Shortness of breath: Secondary | ICD-10-CM

## 2023-08-21 DIAGNOSIS — J441 Chronic obstructive pulmonary disease with (acute) exacerbation: Secondary | ICD-10-CM

## 2023-09-08 ENCOUNTER — Other Ambulatory Visit: Payer: Self-pay | Admitting: Nurse Practitioner

## 2023-09-08 DIAGNOSIS — F17219 Nicotine dependence, cigarettes, with unspecified nicotine-induced disorders: Secondary | ICD-10-CM

## 2023-09-08 NOTE — Telephone Encounter (Signed)
 Please review

## 2023-09-10 ENCOUNTER — Other Ambulatory Visit: Payer: Self-pay | Admitting: Nurse Practitioner

## 2023-09-10 DIAGNOSIS — F17219 Nicotine dependence, cigarettes, with unspecified nicotine-induced disorders: Secondary | ICD-10-CM

## 2023-09-29 DEATH — deceased

## 2023-11-02 ENCOUNTER — Other Ambulatory Visit: Payer: Medicaid Other

## 2023-11-09 ENCOUNTER — Ambulatory Visit: Payer: Medicaid Other | Admitting: Radiation Oncology

## 2024-01-05 ENCOUNTER — Ambulatory Visit: Payer: Medicaid Other | Admitting: Internal Medicine

## 2024-01-06 ENCOUNTER — Encounter: Payer: Medicaid Other | Admitting: Internal Medicine

## 2024-01-14 ENCOUNTER — Ambulatory Visit: Payer: Medicaid Other | Admitting: Nurse Practitioner
